# Patient Record
Sex: Female | Born: 1945 | Race: White | Hispanic: No
Health system: Southern US, Academic
[De-identification: ages and names within clinical notes are randomized; demographics above are authoritative.]

## PROBLEM LIST (undated history)

## (undated) ENCOUNTER — Encounter

## (undated) ENCOUNTER — Ambulatory Visit

## (undated) ENCOUNTER — Telehealth: Attending: MOHS-Micrographic Surgery | Primary: MOHS-Micrographic Surgery

## (undated) ENCOUNTER — Encounter: Attending: Dermatology | Primary: Dermatology

## (undated) ENCOUNTER — Encounter: Attending: Internal Medicine | Primary: Internal Medicine

## (undated) ENCOUNTER — Ambulatory Visit
Payer: Medicare (Managed Care) | Attending: Student in an Organized Health Care Education/Training Program | Primary: Student in an Organized Health Care Education/Training Program

## (undated) ENCOUNTER — Encounter
Attending: Student in an Organized Health Care Education/Training Program | Primary: Student in an Organized Health Care Education/Training Program

## (undated) ENCOUNTER — Ambulatory Visit: Payer: MEDICARE | Attending: Cardiovascular Disease | Primary: Cardiovascular Disease

## (undated) ENCOUNTER — Telehealth

## (undated) ENCOUNTER — Ambulatory Visit: Payer: MEDICARE | Attending: Dermatology | Primary: Dermatology

## (undated) ENCOUNTER — Ambulatory Visit: Payer: MEDICARE

## (undated) ENCOUNTER — Ambulatory Visit: Payer: Medicare (Managed Care) | Attending: Medical | Primary: Medical

## (undated) ENCOUNTER — Ambulatory Visit: Attending: Family | Primary: Family

## (undated) ENCOUNTER — Encounter: Attending: MOHS-Micrographic Surgery | Primary: MOHS-Micrographic Surgery

## (undated) ENCOUNTER — Telehealth: Attending: Dermatology | Primary: Dermatology

## (undated) ENCOUNTER — Ambulatory Visit
Payer: MEDICARE | Attending: Student in an Organized Health Care Education/Training Program | Primary: Student in an Organized Health Care Education/Training Program

## (undated) DIAGNOSIS — F419 Anxiety disorder, unspecified: Secondary | ICD-10-CM

## (undated) DIAGNOSIS — I10 Essential (primary) hypertension: Secondary | ICD-10-CM

## (undated) DIAGNOSIS — G4730 Sleep apnea, unspecified: Secondary | ICD-10-CM

## (undated) DIAGNOSIS — L409 Psoriasis, unspecified: Secondary | ICD-10-CM

## (undated) DIAGNOSIS — K219 Gastro-esophageal reflux disease without esophagitis: Secondary | ICD-10-CM

## (undated) DIAGNOSIS — R011 Cardiac murmur, unspecified: Secondary | ICD-10-CM

## (undated) DIAGNOSIS — I4819 Other persistent atrial fibrillation: Secondary | ICD-10-CM

## (undated) DIAGNOSIS — E079 Disorder of thyroid, unspecified: Secondary | ICD-10-CM

## (undated) DIAGNOSIS — R0600 Dyspnea, unspecified: Secondary | ICD-10-CM

## (undated) DIAGNOSIS — I499 Cardiac arrhythmia, unspecified: Secondary | ICD-10-CM

## (undated) DIAGNOSIS — F32A Depression, unspecified: Secondary | ICD-10-CM

## (undated) DIAGNOSIS — J45909 Unspecified asthma, uncomplicated: Secondary | ICD-10-CM

## (undated) DIAGNOSIS — E039 Hypothyroidism, unspecified: Secondary | ICD-10-CM

## (undated) DIAGNOSIS — I2510 Atherosclerotic heart disease of native coronary artery without angina pectoris: Secondary | ICD-10-CM

## (undated) DIAGNOSIS — I509 Heart failure, unspecified: Secondary | ICD-10-CM

## (undated) DIAGNOSIS — I252 Old myocardial infarction: Secondary | ICD-10-CM

## (undated) DIAGNOSIS — F329 Major depressive disorder, single episode, unspecified: Secondary | ICD-10-CM

## (undated) DIAGNOSIS — E785 Hyperlipidemia, unspecified: Secondary | ICD-10-CM

## (undated) DIAGNOSIS — M1990 Unspecified osteoarthritis, unspecified site: Secondary | ICD-10-CM

## (undated) HISTORY — PX: ESOPHAGEAL DILATION: SHX303

## (undated) HISTORY — DX: Disorder of thyroid, unspecified: E07.9

## (undated) HISTORY — DX: Gastro-esophageal reflux disease without esophagitis: K21.9

## (undated) HISTORY — DX: Hyperlipidemia, unspecified: E78.5

## (undated) HISTORY — DX: Other persistent atrial fibrillation: I48.19

## (undated) HISTORY — DX: Sleep apnea, unspecified: G47.30

## (undated) HISTORY — PX: CARDIAC CATHETERIZATION: SHX172

## (undated) HISTORY — DX: Morbid (severe) obesity due to excess calories: E66.01

## (undated) HISTORY — PX: US ECHOCARDIOGRAPHY: HXRAD669

## (undated) MED ORDER — LOSARTAN 50 MG-HYDROCHLOROTHIAZIDE 12.5 MG TABLET: 0.00000 days

---
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
## 2004-10-01 ENCOUNTER — Other Ambulatory Visit: Payer: Self-pay

## 2004-10-01 ENCOUNTER — Emergency Department: Payer: Self-pay | Admitting: Unknown Physician Specialty

## 2004-10-01 ENCOUNTER — Emergency Department: Payer: Self-pay | Admitting: Emergency Medicine

## 2005-06-22 ENCOUNTER — Inpatient Hospital Stay: Payer: Self-pay | Admitting: Internal Medicine

## 2005-06-22 ENCOUNTER — Other Ambulatory Visit: Payer: Self-pay

## 2006-01-03 ENCOUNTER — Ambulatory Visit: Payer: Self-pay | Admitting: Internal Medicine

## 2006-01-19 ENCOUNTER — Ambulatory Visit: Payer: Self-pay | Admitting: Internal Medicine

## 2007-03-06 ENCOUNTER — Ambulatory Visit: Payer: Self-pay | Admitting: Internal Medicine

## 2007-03-07 ENCOUNTER — Ambulatory Visit: Payer: Self-pay | Admitting: Gastroenterology

## 2007-05-06 ENCOUNTER — Emergency Department: Payer: Self-pay | Admitting: Emergency Medicine

## 2007-11-22 ENCOUNTER — Emergency Department: Payer: Self-pay | Admitting: Emergency Medicine

## 2007-11-28 ENCOUNTER — Emergency Department: Payer: Self-pay | Admitting: Emergency Medicine

## 2008-08-22 ENCOUNTER — Emergency Department: Payer: Self-pay | Admitting: Emergency Medicine

## 2010-11-05 ENCOUNTER — Emergency Department: Payer: Self-pay | Admitting: Emergency Medicine

## 2010-11-06 ENCOUNTER — Emergency Department: Payer: Self-pay | Admitting: Internal Medicine

## 2011-02-01 ENCOUNTER — Emergency Department: Payer: Self-pay | Admitting: Emergency Medicine

## 2011-05-20 ENCOUNTER — Emergency Department: Payer: Self-pay | Admitting: Emergency Medicine

## 2011-10-26 ENCOUNTER — Ambulatory Visit: Payer: Self-pay | Admitting: Gastroenterology

## 2011-11-03 ENCOUNTER — Other Ambulatory Visit (HOSPITAL_COMMUNITY): Payer: Self-pay | Admitting: Urology

## 2011-11-03 DIAGNOSIS — D3500 Benign neoplasm of unspecified adrenal gland: Secondary | ICD-10-CM

## 2011-11-11 ENCOUNTER — Other Ambulatory Visit (HOSPITAL_COMMUNITY): Payer: Self-pay | Admitting: Urology

## 2011-11-11 ENCOUNTER — Ambulatory Visit (HOSPITAL_COMMUNITY)
Admission: RE | Admit: 2011-11-11 | Discharge: 2011-11-11 | Disposition: A | Discharge status: Home / Self Care | Payer: Medicare Other | Source: Ambulatory Visit | Attending: Urology | Admitting: Urology

## 2011-11-11 DIAGNOSIS — D3500 Benign neoplasm of unspecified adrenal gland: Secondary | ICD-10-CM

## 2011-11-11 LAB — CREATININE, SERUM: Creatinine, Ser: 0.91 mg/dL (ref 0.50–1.10)

## 2012-02-15 ENCOUNTER — Ambulatory Visit: Payer: Self-pay | Admitting: Internal Medicine

## 2012-02-22 ENCOUNTER — Ambulatory Visit: Payer: Self-pay | Admitting: Internal Medicine

## 2012-05-18 ENCOUNTER — Inpatient Hospital Stay: Payer: Self-pay | Admitting: Internal Medicine

## 2012-05-18 LAB — COMPREHENSIVE METABOLIC PANEL
Alkaline Phosphatase: 110 U/L (ref 50–136)
Creatinine: 0.82 mg/dL (ref 0.60–1.30)
Glucose: 112 mg/dL — ABNORMAL HIGH (ref 65–99)
Potassium: 4.2 mmol/L (ref 3.5–5.1)
SGOT(AST): 29 U/L (ref 15–37)
SGPT (ALT): 33 U/L (ref 12–78)
Total Protein: 7.1 g/dL (ref 6.4–8.2)

## 2012-05-18 LAB — URINALYSIS, COMPLETE
Bacteria: NONE SEEN
Blood: NEGATIVE
Leukocyte Esterase: NEGATIVE
Nitrite: NEGATIVE
Ph: 6 (ref 4.5–8.0)
Protein: NEGATIVE
RBC,UR: 1 /HPF (ref 0–5)
Specific Gravity: 1.011 (ref 1.003–1.030)
Squamous Epithelial: <1

## 2012-05-18 LAB — CBC WITH DIFFERENTIAL/PLATELET
HCT: 44 % (ref 35.0–47.0)
HGB: 14.7 g/dL (ref 12.0–16.0)
Lymphocyte %: 28.1 %
MCH: 28.3 pg (ref 26.0–34.0)
MCHC: 33.5 g/dL (ref 32.0–36.0)
MCV: 85 fL (ref 80–100)
Monocyte %: 8.5 %
Neutrophil %: 58.9 %
RDW: 14.7 % — ABNORMAL HIGH (ref 11.5–14.5)

## 2012-05-18 LAB — CK TOTAL AND CKMB (NOT AT ARMC): CK-MB: 1.3 ng/mL (ref 0.5–3.6)

## 2012-05-18 LAB — APTT: Activated PTT: 27.5 secs (ref 23.6–35.9)

## 2012-05-18 LAB — LIPID PANEL
Ldl Cholesterol, Calc: 127 mg/dL — ABNORMAL HIGH (ref 0–100)
VLDL Cholesterol, Calc: 28 mg/dL (ref 5–40)

## 2012-05-18 LAB — PRO B NATRIURETIC PEPTIDE: B-Type Natriuretic Peptide: 498 pg/mL — ABNORMAL HIGH (ref 0–125)

## 2012-05-18 LAB — TROPONIN I
Troponin-I: 0.06 ng/mL — ABNORMAL HIGH
Troponin-I: 0.07 ng/mL — ABNORMAL HIGH

## 2012-05-19 LAB — BASIC METABOLIC PANEL
Anion Gap: 9 (ref 7–16)
BUN: 10 mg/dL (ref 7–18)
Chloride: 104 mmol/L (ref 98–107)
Co2: 25 mmol/L (ref 21–32)
EGFR (Non-African Amer.): >60
Glucose: 119 mg/dL — ABNORMAL HIGH (ref 65–99)
Osmolality: 276 (ref 275–301)

## 2012-05-19 LAB — CBC WITH DIFFERENTIAL/PLATELET
Basophil #: 0 10*3/uL (ref 0.0–0.1)
Eosinophil #: 0.2 10*3/uL (ref 0.0–0.7)
Eosinophil %: 2.8 %
HCT: 42 % (ref 35.0–47.0)
HGB: 13.6 g/dL (ref 12.0–16.0)
Lymphocyte %: 26.2 %
MCH: 27.9 pg (ref 26.0–34.0)
MCHC: 32.5 g/dL (ref 32.0–36.0)
Monocyte %: 8.7 %
Neutrophil %: 61.7 %
Platelet: 214 10*3/uL (ref 150–440)

## 2012-05-19 LAB — CK TOTAL AND CKMB (NOT AT ARMC): CK-MB: 1.2 ng/mL (ref 0.5–3.6)

## 2012-05-19 LAB — APTT: Activated PTT: 91 secs — ABNORMAL HIGH (ref 23.6–35.9)

## 2012-05-20 LAB — BASIC METABOLIC PANEL
Anion Gap: 6 — ABNORMAL LOW (ref 7–16)
Chloride: 109 mmol/L — ABNORMAL HIGH (ref 98–107)
Creatinine: 0.92 mg/dL (ref 0.60–1.30)
EGFR (Non-African Amer.): >60
Glucose: 110 mg/dL — ABNORMAL HIGH (ref 65–99)
Osmolality: 281 (ref 275–301)
Potassium: 3.6 mmol/L (ref 3.5–5.1)
Sodium: 141 mmol/L (ref 136–145)

## 2012-05-20 LAB — CBC WITH DIFFERENTIAL/PLATELET
Basophil #: 0 10*3/uL (ref 0.0–0.1)
Eosinophil #: 0.2 10*3/uL (ref 0.0–0.7)
Lymphocyte %: 34.5 %
MCH: 28 pg (ref 26.0–34.0)
MCV: 85 fL (ref 80–100)
RBC: 4.66 10*6/uL (ref 3.80–5.20)
RDW: 14.8 % — ABNORMAL HIGH (ref 11.5–14.5)

## 2012-05-20 LAB — APTT: Activated PTT: 85 secs — ABNORMAL HIGH (ref 23.6–35.9)

## 2012-05-21 LAB — PLATELET COUNT: Platelet: 182 10*3/uL (ref 150–440)

## 2012-05-21 LAB — HEMOGLOBIN: HGB: 13.2 g/dL (ref 12.0–16.0)

## 2012-05-28 ENCOUNTER — Emergency Department: Payer: Self-pay | Admitting: Emergency Medicine

## 2012-05-28 LAB — URINALYSIS, COMPLETE
Blood: NEGATIVE
Ketone: NEGATIVE
RBC,UR: <1 /HPF (ref 0–5)
Specific Gravity: 1.006 (ref 1.003–1.030)
Squamous Epithelial: <1

## 2012-05-28 LAB — CBC
HGB: 14 g/dL (ref 12.0–16.0)
MCH: 28.3 pg (ref 26.0–34.0)
MCHC: 33.8 g/dL (ref 32.0–36.0)
MCV: 84 fL (ref 80–100)
Platelet: 270 10*3/uL (ref 150–440)
RBC: 4.95 10*6/uL (ref 3.80–5.20)
RDW: 14.8 % — ABNORMAL HIGH (ref 11.5–14.5)
WBC: 6.8 10*3/uL (ref 3.6–11.0)

## 2012-05-29 LAB — BASIC METABOLIC PANEL
Anion Gap: 8 (ref 7–16)
BUN: 28 mg/dL — ABNORMAL HIGH (ref 7–18)
Calcium, Total: 8.5 mg/dL (ref 8.5–10.1)
Chloride: 103 mmol/L (ref 98–107)
Co2: 26 mmol/L (ref 21–32)
Creatinine: 1.21 mg/dL (ref 0.60–1.30)
EGFR (African American): 54 — ABNORMAL LOW
Osmolality: 280 (ref 275–301)
Potassium: 3.4 mmol/L — ABNORMAL LOW (ref 3.5–5.1)

## 2012-07-13 ENCOUNTER — Observation Stay: Payer: Self-pay | Admitting: Internal Medicine

## 2012-07-13 LAB — COMPREHENSIVE METABOLIC PANEL
Albumin: 3.1 g/dL — ABNORMAL LOW (ref 3.4–5.0)
Anion Gap: 5 — ABNORMAL LOW (ref 7–16)
BUN: 17 mg/dL (ref 7–18)
Bilirubin,Total: 0.7 mg/dL (ref 0.2–1.0)
Chloride: 103 mmol/L (ref 98–107)
Creatinine: 1.09 mg/dL (ref 0.60–1.30)
EGFR (African American): >60
EGFR (Non-African Amer.): 53 — ABNORMAL LOW
Osmolality: 274 (ref 275–301)
Potassium: 3.5 mmol/L (ref 3.5–5.1)
SGOT(AST): 21 U/L (ref 15–37)
SGPT (ALT): 24 U/L (ref 12–78)
Total Protein: 6.3 g/dL — ABNORMAL LOW (ref 6.4–8.2)

## 2012-07-13 LAB — CK TOTAL AND CKMB (NOT AT ARMC): CK, Total: 94 U/L (ref 21–215)

## 2012-07-13 LAB — CBC
HCT: 38.2 % (ref 35.0–47.0)
HGB: 12.8 g/dL (ref 12.0–16.0)
MCH: 28.4 pg (ref 26.0–34.0)
RBC: 4.5 10*6/uL (ref 3.80–5.20)
WBC: 8.5 10*3/uL (ref 3.6–11.0)

## 2012-07-13 LAB — TROPONIN I: Troponin-I: 0.12 ng/mL — ABNORMAL HIGH

## 2012-07-14 LAB — URINALYSIS, COMPLETE
Bacteria: NONE SEEN
Bilirubin,UR: NEGATIVE
Glucose,UR: NEGATIVE mg/dL (ref 0–75)
Ketone: NEGATIVE
Nitrite: NEGATIVE
Protein: NEGATIVE
Specific Gravity: 1.021 (ref 1.003–1.030)
WBC UR: 5 /HPF (ref 0–5)

## 2012-07-14 LAB — CK TOTAL AND CKMB (NOT AT ARMC)
CK, Total: 70 U/L (ref 21–215)
CK, Total: 73 U/L (ref 21–215)
CK-MB: 0.9 ng/mL (ref 0.5–3.6)

## 2012-07-14 LAB — TROPONIN I: Troponin-I: 0.09 ng/mL — ABNORMAL HIGH

## 2012-08-15 ENCOUNTER — Ambulatory Visit: Payer: Self-pay | Admitting: Internal Medicine

## 2012-08-16 ENCOUNTER — Ambulatory Visit: Payer: Self-pay | Admitting: Internal Medicine

## 2012-09-10 ENCOUNTER — Other Ambulatory Visit: Payer: Self-pay | Admitting: Cardiovascular Disease

## 2012-09-20 ENCOUNTER — Ambulatory Visit: Payer: Self-pay | Admitting: Cardiovascular Disease

## 2012-10-05 ENCOUNTER — Ambulatory Visit (INDEPENDENT_AMBULATORY_CARE_PROVIDER_SITE_OTHER): Payer: MEDICARE | Admitting: Surgery

## 2012-10-25 ENCOUNTER — Ambulatory Visit: Payer: Self-pay | Admitting: Internal Medicine

## 2013-08-21 ENCOUNTER — Ambulatory Visit: Payer: Commercial Managed Care - HMO | Admitting: Podiatry

## 2013-09-05 ENCOUNTER — Ambulatory Visit: Payer: Self-pay | Admitting: Gastroenterology

## 2013-10-17 ENCOUNTER — Ambulatory Visit: Payer: Self-pay | Admitting: Internal Medicine

## 2014-04-03 ENCOUNTER — Emergency Department: Payer: Self-pay | Admitting: Emergency Medicine

## 2014-04-03 LAB — BASIC METABOLIC PANEL
ANION GAP: 8 (ref 7–16)
BUN: 15 mg/dL (ref 7–18)
CREATININE: 0.94 mg/dL (ref 0.60–1.30)
Calcium, Total: 8.6 mg/dL (ref 8.5–10.1)
Chloride: 104 mmol/L (ref 98–107)
Co2: 28 mmol/L (ref 21–32)
EGFR (African American): >60
EGFR (Non-African Amer.): >60
Glucose: 118 mg/dL — ABNORMAL HIGH (ref 65–99)
OSMOLALITY: 281 (ref 275–301)
POTASSIUM: 4 mmol/L (ref 3.5–5.1)
SODIUM: 140 mmol/L (ref 136–145)

## 2014-04-03 LAB — CBC
HCT: 46.1 % (ref 35.0–47.0)
HGB: 15 g/dL (ref 12.0–16.0)
MCH: 28.6 pg (ref 26.0–34.0)
MCHC: 32.5 g/dL (ref 32.0–36.0)
MCV: 88 fL (ref 80–100)
Platelet: 256 10*3/uL (ref 150–440)
RBC: 5.23 10*6/uL — ABNORMAL HIGH (ref 3.80–5.20)
RDW: 13.6 % (ref 11.5–14.5)
WBC: 7.8 10*3/uL (ref 3.6–11.0)

## 2014-04-03 LAB — TROPONIN I: Troponin-I: 0.03 ng/mL

## 2014-04-03 LAB — PRO B NATRIURETIC PEPTIDE: B-Type Natriuretic Peptide: 826 pg/mL — ABNORMAL HIGH (ref 0–125)

## 2014-04-11 DIAGNOSIS — I252 Old myocardial infarction: Secondary | ICD-10-CM

## 2014-04-11 HISTORY — DX: Old myocardial infarction: I25.2

## 2014-04-18 ENCOUNTER — Observation Stay: Payer: Self-pay | Admitting: Internal Medicine

## 2014-04-18 LAB — PRO B NATRIURETIC PEPTIDE: B-Type Natriuretic Peptide: 538 pg/mL — ABNORMAL HIGH (ref 0–125)

## 2014-04-18 LAB — BASIC METABOLIC PANEL
Anion Gap: 8 (ref 7–16)
BUN: 16 mg/dL (ref 7–18)
CHLORIDE: 108 mmol/L — AB (ref 98–107)
CO2: 24 mmol/L (ref 21–32)
CREATININE: 0.79 mg/dL (ref 0.60–1.30)
Calcium, Total: 8.4 mg/dL — ABNORMAL LOW (ref 8.5–10.1)
EGFR (African American): >60
EGFR (Non-African Amer.): >60
Glucose: 94 mg/dL (ref 65–99)
Osmolality: 280 (ref 275–301)
POTASSIUM: 3.9 mmol/L (ref 3.5–5.1)
Sodium: 140 mmol/L (ref 136–145)

## 2014-04-18 LAB — TROPONIN I
TROPONIN-I: 0.04 ng/mL
TROPONIN-I: 0.03 ng/mL
TROPONIN-I: 0.04 ng/mL

## 2014-04-18 LAB — CBC
HCT: 44.8 % (ref 35.0–47.0)
HGB: 14.6 g/dL (ref 12.0–16.0)
MCH: 28.7 pg (ref 26.0–34.0)
MCHC: 32.6 g/dL (ref 32.0–36.0)
MCV: 88 fL (ref 80–100)
Platelet: 228 10*3/uL (ref 150–440)
RBC: 5.1 10*6/uL (ref 3.80–5.20)
RDW: 13.5 % (ref 11.5–14.5)
WBC: 6.4 10*3/uL (ref 3.6–11.0)

## 2014-04-18 LAB — PROTIME-INR
INR: 0.9
PROTHROMBIN TIME: 12.5 s (ref 11.5–14.7)

## 2014-04-18 LAB — CK TOTAL AND CKMB (NOT AT ARMC)
CK, TOTAL: 97 U/L (ref 26–192)
CK, Total: 86 U/L (ref 26–192)
CK, Total: 85 U/L (ref 26–192)
CK-MB: 1.8 ng/mL (ref 0.5–3.6)
CK-MB: 1.7 ng/mL (ref 0.5–3.6)
CK-MB: 1.5 ng/mL (ref 0.5–3.6)

## 2014-04-18 LAB — APTT: Activated PTT: 25.9 secs (ref 23.6–35.9)

## 2014-04-19 LAB — MAGNESIUM: Magnesium: 2 mg/dL

## 2014-04-19 LAB — TSH: Thyroid Stimulating Horm: 1.77 u[IU]/mL

## 2014-05-22 ENCOUNTER — Emergency Department: Payer: Self-pay | Admitting: Emergency Medicine

## 2014-05-27 ENCOUNTER — Ambulatory Visit: Payer: Self-pay | Admitting: Urology

## 2014-06-02 ENCOUNTER — Ambulatory Visit: Payer: Self-pay | Admitting: Internal Medicine

## 2014-06-25 ENCOUNTER — Ambulatory Visit: Payer: Self-pay

## 2014-08-01 NOTE — Discharge Summary (Signed)
PATIENT NAME:  Patricia Walker, Patricia Walker MR#:  [id number] DATE OF BIRTH:  10-11-1945  DATE OF ADMISSION:  05/18/2012 DATE OF DISCHARGE:  05/25/2012  PRIMARY CARE PHYSICIAN:  Lamonte Sakai, MD  CARDIOLOGIST:  Neoma Laming, MD  DISCHARGE DIAGNOSES: 1.  Unstable angina.  2.  History of atrial fibrillation.  3.  History of hypertension.  4.  Sinus pauses.   HOSPITAL COURSE: This lady was admitted through the Emergency Room complaining of left substernal chest pain. Please refer to history and physical for full details. First set of cardiac enzymes were elevated.  Cardiology was notified, namely Dr. Neoma Laming, who performed left heart catheterization. Catheterization was uncomplicated and revealed a mid descending coronary artery lesion of 60%, mid circumflex of 50% with mild RCA disease and normal ejection fraction. The patient was placed on intravenous heparin,  nitrates, aspirin and beta blockers. She continued to have chest pain over the weekend while in a monitored bed. Her Imdur was increased to 60 mg, which resulted in resolution of her chest pain, although the patient complained of a headache due to that. She also exhibited 3-second pauses recorded on 04/19/2012 and occurred again on 04/20/2012 her metoprolol and amiodarone were held. The patient was asymptomatic on all occasions of her sinus pauses. The case discussed with Dr. [name], Cardiology, who recommended that since patient is asymptomatic, she could be discharged home to have a Holter monitor arranged as an outpatient and discharge her once the amiodarone and metoprolol are suspended. The patient'Clarabell Matsuoka Imdur was also reduced to 30 mg daily to alleviate her headache.   DISPOSITION: Home.   DISCHARGE CONDITION: Satisfactory.   DISCHARGE MEDICATIONS: The patient will resume her Vicodin 5/325 every 6 hours p.r.n., fexofenadine 180 mg daily, hydrochlorothiazide/losartan 12.5/50, 1 daily, aspirin 325 mg daily, meloxicam 75 mg daily, Prilosec 40 mg daily,  isosorbide mononitrate 30 mg daily, Nitrostat 0.4 mg sublingual p.r.n. q.2 to 5 minutes pain, atorvastatin 20 mg once daily. The patient instructed to discontinue amiodarone.   FOLLOWUP: Follow up with Dr. Neoma Laming in 1 to 2 days.   DIET: Low sodium, low cholesterol.   ACTIVITY: No exertional activity until cardiology follow up.   Discharge process time spent: 32 minutes     ____________________________ [name] Maxon. Elijio Miles, MD sat:ct D: 05/21/2012 13:16:44 ET T: 05/21/2012 13:33:56 ET JOB#: [id number]  cc: Alfredia Ferguson A. Elijio Miles, MD, <Dictator> Perrin Maltese, MD [name], MD Alfredia Ferguson Lisette Abu MD ELECTRONICALLY SIGNED 06/06/2012 13:11

## 2014-08-01 NOTE — Consult Note (Signed)
PATIENT NAME:  Patricia Walker, Patricia Walker MR#:  [id number] DATE OF BIRTH:  May 15, 1945  DATE OF CONSULTATION:  05/18/2012  REFERRING PHYSICIAN:    CONSULTING PHYSICIAN:  [name], MD  INDICATION FOR CONSULTATION: Non-STEMI.   HISTORY OF PRESENT ILLNESS: This is a 69 year old white female with a past medical history of hypertension, GI reflux, with atrial fibrillation who came into the hospital since morning having chest pain. The chest pain is pressure-type associated with shortness of breath and diaphoresis with mildly elevated troponin, thus I was asked to evaluate the patient. The patient has a past medical history of: 1. Atrial fibrillation.  2. Hypertension.  3. Hyperlipidemia.  4. No history of diabetes.   SOCIAL HISTORY: She quit several years ago smoking.   FAMILY HISTORY: Positive for a premature history of coronary artery disease.   ALLERGIES: Not known.   PHYSICAL EXAMINATION:  GENERAL: She is alert, oriented x 3, in mild distress due to chest pain. Her blood pressure is 148/78, respirations 18, pulse right now 96. The monitor shows atrial fibrillation.  NECK: Positive JVD.  LUNGS: Good air entry.  HEART: Irregularly irregular pulse. Normal S1, S2. No audible murmur.  ABDOMEN: Soft, nontender, positive bowel sounds.  EXTREMITIES: No pedal edema.   LABS/STUDIES: EKG shows a-fib 103 beats per minute, nonspecific ST-T changes. The first set troponin is 0.06.   ASSESSMENT AND PLAN: Atrial fibrillation, unstable angina with mildly elevated troponin.   PLAN: To do left heart catheterization.  ____________________________ [name], MD sak:jm D: 05/18/2012 11:25:42 ET T: 05/18/2012 11:42:09 ET JOB#: [id number]  cc: [name], MD, <Dictator> [name] MD ELECTRONICALLY SIGNED 05/28/2012 9:04

## 2014-08-01 NOTE — H&P (Signed)
PATIENT NAME:  Patricia Walker, Patricia Walker MR#:  [id number] DATE OF BIRTH:  1945/06/01  DATE OF ADMISSION:  05/18/2012  PRIMARY CARE PHYSICIAN: Perrin Maltese, MD  CARDIOLOGIST: [name], MD  REQUESTING PHYSICIAN: Algis Liming. Jimmye Norman, MD  CHIEF COMPLAINT: Chest pain.   HISTORY OF PRESENT ILLNESS: The patient is a 69 year old female with a known history of hypertension, hyperlipidemia, atrial fibrillation, is being admitted for unstable angina. The patient started having chest pain this morning around 6:00 a.m., left-sided, not radiating, about 8 out of 10 in severity, was shooting in nature, associated with mild shortness of breath and headache. She was also nauseated. She did not vomit. She never had this bad chest pain ever and decided to come to the Emergency Department. While in the ED, she was found to have borderline elevated troponin with a value of 0.06 and she is being admitted for further evaluation and management, as she has continued to have chest pain.   PAST MEDICAL HISTORY:  1.  Atrial fibrillation, not on Coumadin.  2.  Hypertension.  3.  Hyperlipidemia.  4.  History of CHF.   SOCIAL HISTORY: Quit smoking about a year ago. No alcohol.   FAMILY HISTORY: Positive for father with heart disease, died at the age of 69. Son died of heart disease at the age of 59.   ALLERGIES: No known drug allergies.   MEDICATIONS AT HOME:  1.  Acetaminophen/hydrocodone 325/5, 1 tablet p.o. every 6 hours as needed. 2.  Amiodarone 200 mg p.o. daily.  3.  Aspirin 325 mg p.o. daily.  4.  Fexofenadine 180 mg p.o. daily.  5.  Hydrochlorothiazide/losartan 12.5/50, 1 tablet p.o. daily.  6.  Meloxicam 7.5 mg p.o. daily. 7.  Prilosec 40 mg p.o. b.i.d.   REVIEW OF SYSTEMS:    CONSTITUTIONAL: No fever, fatigue, weakness.  EYES: No blurred or double vision.  ENT: No tinnitus or ear pain.  RESPIRATORY: No cough, wheezing or hemoptysis.  CARDIOVASCULAR: Positive for chest pain, shortness of breath. No  palpitations. History of irregular heart beat.  GASTROINTESTINAL: Positive for nausea. No vomiting or diarrhea.  GENITOURINARY: No dysuria or hematuria.  ENDOCRINE: No polyuria or nocturia. HEMATOLOGIC: No anemia or easy bruising.  SKIN: No rash or lesion.  MUSCULOSKELETAL: No arthritis or muscle cramp.  NEUROLOGIC: No tingling, numbness or weakness.  PSYCHIATRIC: No history of anxiety or depression.   PHYSICAL EXAMINATION:  VITAL SIGNS: Temperature 97.3, heart rate 78 per minute, respirations 12 per minute, blood pressure 127/91 mmHg. She is saturating 97% on room air.  GENERAL: The patient is a 69 year old female lying in the bed comfortably without any acute distress.  EYES: Pupils equal, round, reactive to light and accommodation. No scleral icterus. Extraocular muscles intact.  HEENT: Head atraumatic, normocephalic. Oropharynx and nasopharynx clear. NECK: Supple. No jugular venous distention. No thyroid enlargement or tenderness.  LUNGS: Clear to auscultation bilaterally. No wheezing, rales, rhonchi or crepitation.  CARDIOVASCULAR: Irregularly irregular heart sounds. No murmurs, rubs or gallop.  ABDOMEN: Soft, nontender, nondistended. Bowel sounds present. No organomegaly or mass.  EXTREMITIES: No pedal edema, cyanosis or clubbing.  NEUROLOGIC: Nonfocal examination. Cranial nerves II through XII intact. Muscle strength 5/5. Extremity sensation intact.  PSYCHIATRIC: The patient is oriented to time, place and person x 3.  SKIN: No obvious rash, lesion or ulcer.  LABORATORY, DIAGNOSTIC AND RADIOLOGICAL DATA: Normal BMP. Normal liver function tests. Normal CBC. Troponin of 0.06. PTT of 27.5. Urinalysis was negative.   Chest x-ray while in the  ED showed no acute cardiopulmonary disease. EKG shows atrial fibrillation with rate of 103 beats per minute, nonspecific ST-T changes.   IMPRESSION AND PLAN:  1.  Unstable angina:  Will rule her out with serial troponins. She does have a strong  family history of premature cardiac disease along with long history of smoking, and considering her age with the history of atrial fibrillation, will monitor on telemetry, get cardiology consult, start her on aspirin, full-dose Lovenox and check lipid profile. Will start her on low-dose metoprolol.  2.  Suspected non-ST elevation myocardial infarction with troponin of 0.06 with typical anginal chest pain: Will start her on full-dose heparin, aspirin and beta blocker. Discussed with Dr. Neoma Laming, who is likely taking her to cardiac cath lab today. 3.  History of atrial fibrillation, not on Coumadin: Followed by Dr. Neoma Laming as an outpatient. Rate is well controlled at this time on amiodarone. Will add metoprolol at this time. 4.  Hypertension: Will continue home medication and monitor her blood pressure.   CODE STATUS: Full code.   TIME SPENT: Total time taking care of this patient is 55 minutes.    ____________________________ Lucina Mellow. Manuella Ghazi, MD vss:jm D: 05/18/2012 13:31:48 ET T: 05/18/2012 14:24:33 ET JOB#: [id number]  cc: Sundeep Cary S. Manuella Ghazi, MD, <Dictator> Perrin Maltese, MD [name], MD [place] MD ELECTRONICALLY SIGNED 05/18/2012 17:14

## 2014-08-01 NOTE — Consult Note (Signed)
PATIENT NAME:  Patricia Walker, Patricia Walker MR#:  [id number] DATE OF BIRTH:  November 29, 1945  DATE OF CONSULTATION:  07/13/2012  CONSULTING PHYSICIAN:  [name], MD  PATIENT NAME: This is a 69 year old white female with a past medical history of moderate coronary artery disease. She recently had a cardiac catheterization. She did not have any significant or hemodynamically significant coronary artery disease, with normal ejection fraction. Came into the office yesterday with chest pain. She was found to have intermittent paroxysmal atrial fibrillation with a heart rate of 89. She was started on amiodarone 200 b.i.d. Today she presents again with some dizziness, presyncopal episode with associated chest pain, but she says the chest pain is of a burning type, like hot flash in her chest. The son says he is very uncomfortable with her. She has been having this for a few days and they are concerned.   PAST MEDICAL HISTORY: History of coronary artery disease (nonobstructive), hypertension and hyperlipidemia.   SOCIAL HISTORY: She continues to smoke. No EtOH abuse.   FAMILY HISTORY: Positive for coronary artery disease.   PHYSICAL EXAMINATION:  GENERAL: She is alert, oriented x 3, in no acute distress right now.  VITALS: Her pulse is 58, respirations 18, blood pressure 98/40.  NECK: No JVD.  LUNGS: Clear.  HEART: Regular rate and rhythm. Normal S1, S2. No audible murmur.  ABDOMEN: Soft, nontender, positive bowel sounds.  EXTREMITIES: No pedal edema.   LABORATORY AND DIAGNOSTIC DATA: EKG shows sinus bradycardia, about 58 beats per minute. No acute changes. Mildly elevated troponin, less than 1.0.   ASSESSMENT AND PLAN: Chest pain with moderate coronary artery disease, normal left ventricular ejection fraction, relatively hypotensive, Buster be dehydrated. Advised getting rule out myocardial infarction with serial cardiac enzymes since the first set is slightly elevated. EKG has no acute changes. Advised getting  ultrasound of the gallbladder and will advise admitting the patient for observation.   ____________________________ [name], MD sak:jm D: 07/13/2012 17:07:26 ET T: 07/13/2012 17:35:05 ET JOB#: [id number]  cc: [name], MD, <Dictator> [name] MD ELECTRONICALLY SIGNED 07/23/2012 8:54

## 2014-08-01 NOTE — H&P (Signed)
PATIENT NAME:  Patricia Walker, Patricia Walker MR#:  [id number] DATE OF BIRTH:  Dec 11, 1945  DATE OF ADMISSION:  07/13/2012  CARDIOLOGIST: Dr. Neoma Laming.   CHIEF COMPLAINT: "Feeling hot in the midepigastric area."   HISTORY OF THE PRESENT ILLNESS: The patient is a 69 year old morbidly obese Caucasian female with history of coronary artery disease, underwent recent cardiac catheterization in February 2014 which showed CAD; however, noncritical for any further intervention being medically treated. History of hypertension, hyperlipidemia and recent onset of AFib on amiodarone. Comes to the Emergency Room with " feeling hot and burning around the epigastric and midsternal area". She denies any bloating, vomiting, or any symptoms related to GERD. She was found to have troponin of 0.12. EKG did not show any acute changes. She is being admitted for further evaluation and management.  In the Emergency Room the patient received aspirin, a dose of Lovenox, and was seen by Dr. Neoma Laming.   PAST MEDICAL HISTORY:  1.  History of congestive heart failure.  2.  Atrial fibrillation. Recently started on amiodarone 200 mg b.i.d.  3.  Psoriasis.  4.  Hypertension.  5.  History of nephrolithiasis.  6.  History of gastroesophageal reflux disease, status post.  7.  History of esophageal dilation.   ALLERGIES: No known drug allergies.   MEDICATIONS:  1.  Maxzide 1000 mg b.i.d.  2.  ProAir HFA 2 puffs every 4 to 6 hourly as needed.  3.  Nitrostat 0.4 mg sublingual As needed.  4.  Nexium 40 mg b.i.d.  5.  Nasonex 50 mg two sprays as needed.  6.  Meloxicam 7.5 mg daily.  7.  Imdur 60 mg extended release daily.  8.  Hydrochlorothiazide/losartan 12.5/50 one daily.  9.  Fexofenadine 180 mg daily.  10.  Atorvastatin 20 mg daily.  11.  Aspirin 325 mg daily.  12.  Amiodarone 200 mg daily.   SOCIAL HISTORY:  Lives at home, nonsmoker, nonalcoholic. Quit smoking about a year ago.   FAMILY HISTORY:  Positive for heart disease  in father.  Son died of heart disease at age 41.   PAST SURGICAL HISTORY:  None.   REVIEW OF SYSTEMS: CONSTITUTIONAL: No fever, fatigue, weakness.  HEENT:  Eyes:  No blurred or double vision or cataracts.  ENT: No tinnitus, ear pain, hearing loss.  RESPIRATORY: No cough, wheeze, hemoptysis, or COPD.  CARDIOVASCULAR: Positive for chest pain and hypertension. Positive for palpitations.  GASTROINTESTINAL: No nausea, vomiting, diarrhea or abdominal pain.  GENITOURINARY: No dysuria, hematuria, or frequency.  ENDOCRINE: No polyuria, nocturia, or thyroid problems.  HEMATOLOGY: No anemia or easy bruising.  SKIN: Positive for psoriatic rash.  MUSCULOSKELETAL: Positive for back pain and arthritis.  NEUROLOGIC: No CVA or TIA.  PSYCHIATRIC: No anxiety or depression.   All other systems reviewed and are negative.   PHYSICAL EXAMINATION:  GENERAL: The patient is awake, alert, oriented x3, not in acute distress.  VITAL SIGNS: Afebrile, pulse is 62, blood pressure is 100/63, sats are 98% on room air.  GENERAL: The patient is morbidly obese, not in distress.  HEENT: Atraumatic, normocephalic. PERRLA. EOM intact. Oral mucosa is moist.  NECK: Supple. No JVD. No carotid bruit.  RESPIRATORY: Clear to auscultation bilaterally. No rales, rhonchi, respiratory distress or labored breathing.  CARDIOVASCULAR:   Both the heart sounds are normal. Rate, rhythm regular. PMI not lateralized.  CHEST:  Nontender.  EXTREMITIES: Good pedal pulses, good femoral pulses. No lower extremity edema.  SKIN: The patient has psoriatic rash in the  lower extremities.  ABDOMEN: Obese, soft, nontender. No organomegaly.  NEUROLOGIC: Grossly intact cranial nerves II through XII. No motor or sensory deficits.  PSYCHIATRIC: The patient is awake, alert, and oriented area.   DIAGNOSTIC DATA:  EKG shows sinus bradycardia with left ventricular hypertrophy, nonspecific T wave changes.   PT-INR within normal limits. Troponin 0.12.  CK  total and MB fraction are within normal limits. CBC within normal limits. Comprehensive metabolic panel within normal limits.   Chest x-ray shows no acute cardiopulmonary disease.   ASSESSMENT: A 69 year old  patient with history of coronary artery disease.  The patient presents with feeling hot and burning in the epigastric area for 2 to 3 days. We will admit patient with:  1.  Unstable angina with history of coronary artery disease, recent catheterization in February 2014, rule out gallbladder disease although LFT is normal. The patient reports complaints of some right upper quadrant pain.  2.  Gastroesophageal reflux disease.  The patient to continue PPI.  3.  Atrial fibrillation. Started on amiodarone since April 3rd by Dr. [name], heart rate in the 50s, decrease dose to 200 mg p.o. daily.  4.  Relative hypotension. Hold BP meds today.  5.  Morbid obesity.  6.  Hyperlipidemia. We will continue statins.   Further work-up per the patient's clinical course. Hospital admission plan was discussed with the patient. The patient was seen by Dr. [name] in the Emergency Room.   TIME SPENT:  50 minutes.   ____________________________ Hart Rochester Posey Pronto, MD sap:dd D: 07/13/2012 18:43:19 ET T: 07/13/2012 19:47:45 ET JOB#: [id number]  cc: Tzivia Oneil A. Posey Pronto, MD, <Dictator> [name], MD Ilda Basset MD ELECTRONICALLY SIGNED 07/18/2012 14:17

## 2014-08-10 NOTE — H&P (Signed)
PATIENT NAME:  Patricia Walker, Patricia Walker MR#:  [id number] DATE OF BIRTH:  April 22, 1945  DATE OF ADMISSION:  04/18/2014  PRIMARY CARE PHYSICIAN:  Dr. Perrin Maltese.   EMERGENCY ROOM PHYSICIAN: Dr. Jimmye Norman.   CHIEF COMPLAINT: Chest pain.   HISTORY OF PRESENT ILLNESS: The patient is 69 year old female patient with history of high blood pressure, sleep apnea, psoriatic psoriasis, comes in because of chest pain. The patient started to have chest pain this morning. In the morning, around 15 minutes, started around 10:00 a.m. and started in the middle of the chest, radiated across. The patient took 2 nitroglycerin that helped her with chest pain. The patient did not have any radiation of the pain to the back or the left arm. Did not have dizziness, but felt nauseous. Did not have sweating. The patient felt short of breath.    PAST MEDICAL HISTORY: Significant for high blood pressure, history of sleep apnea, psoriasis, hyperlipidemia, previous history of heart attack last year. She follows up with Dr. Neoma Laming. Also has a history of proximal atrial fibrillation.   ALLERGIES: No known allergies.   PAST SURGICAL HISTORY: None.   FAMILY HISTORY: Brother has diabetes.   SOCIAL HISTORY: Quit smoking 3 years ago. The patient was a heavy smoker, smoked about 3 packs for 40 years. Occasional alcohol. No drugs.   MEDICATIONS: Amiodarone 200 mg b.i.d., aspirin 325 mg p.o. daily, atorvastatin 20 mg daily, folic acid 1 mg daily, furosemide 20 mg p.o. daily, hydrochlorothiazide/losartan 12.5/50 mg p.o. daily, Imdur 60 mg p.o. daily, Meloxicam 15 mg p.o. daily, Nasonex 2 sprays in each nostril, Nexium 40 mg p.o. daily, Nitrostat sublingual 0.4 mg every 15 minutes as needed. ProAir 1 puff b.i.d., nitroglycerin sublingual as needed for chest pain p.o. 0.4 mg, Ranexa 100 mg p.o. b.i.d.   REVIEW OF SYSTEMS:  CONSTITUTIONAL: No fever. No fatigue.  EYES: No blurred vision.  EARS, NOSE, AND THROAT: No tinnitus. No ear pain. No  epistaxis. No difficulty swallowing.  RESPIRATORY: The patient has no cough, no wheezing, no hemoptysis.  CARDIOVASCULAR: No chest pain. No orthopnea. No pedal edema.  GASTROINTESTINAL: No nausea. No vomiting. No abdominal pain.  GENITOURINARY: No dysuria or hematuria.  ENDOCRINE: No polyuria or nocturia.  HEMATOLOGIC: No anemia or easy bruising. INTEGUMENTARY: No skin rashes.  MUSCULOSKELETAL: No joint pain.  NEUROLOGIC: No numbness or weakness.  PSYCHIATRIC: No anxiety or insomnia.   PHYSICAL EXAMINATION: VITAL SIGNS: Temperature 98.8 heart rate 70, blood pressure 132/120 initially, but repeat 127/84, saturations 97% on room air.  GENERAL: The patient is alert, awake, oriented, obese female with a BMI of 51, not in distress.  HEAD: Atraumatic, normocephalic.  EYES: Pupils equal, reacting to light. Extraocular movements are intact.  EARS, NOSE, AND THROAT: Tympanic membrane no congestion. External auditory canals are normal. Hearing is intact. Nose: No turbinate hypertrophy. Pharynx: No pharyngeal erythema. Mucous membranes are normal.  NECK: Thyroid enlargement is not seen. No JVD. No carotid bruit. No lymphadenopathy.   RESPIRATORY: Bilaterally clear to auscultation. No wheeze. No rales. Not using accessory muscles of respiration.  CARDIOVASCULAR: No chest wall tenderness, rate regular. The patient has no murmurs. Good pedal pulses and femoral pulses. No extremity edema.  ABDOMEN: Soft, obese. Bowel sounds present. No organomegaly.  MUSCULOSKELETAL: Strength 5/5 in upper and lower extremities.  SKIN: No skin rashes. Warm and dry.  LYMPHATICS: No lymphadenopathy in cervical or axillary region.  NEUROLOGIC: Cranial nerves II through XII are intact. DTR 2+ bilaterally; no dysarthria or aphagia. No  dysphagia. Power is 5/5 in upper and lower extremities.  PSYCHIATRIC: Oriented to time, place, and person. Cooperative. Judgment is good.   LABORATORY DATA: Electrolytes: Sodium 140, potassium  3.9, chloride 108, bicarbonate 24, BUN 16, creatinine 0.7 and glucose 94. Troponin 0.03  CK total is 97. BNP 538. Chest x-ray shows borderline cardiomegaly. Her troponins as I mentioned. WBC 6.4, hemoglobin 14.6, hematocrit 44.8, platelets 228.   EKG: Atrial fibrillation with 76 beats per minutes.   ASSESSMENT AND PLAN:  1. The patient is a 69 year old morbidly obese female with risk factors of high blood pressure, previous heart attack with medical management advised in last year, comes in with chest pain. She took nitroglycerin. Symptoms are concerning for unstable angina. Admit her to telemetry. Started on aspirin, beta blockers, statins, and nitrates. The patient's troponins will be trended again 2 more times along, with CK. If the troponin is elevated, we will start her  on dull-dose anticoagulation, and obtain cardiology consult. Otherwise, she will get a stress test tomorrow morning.  2. Sleep apnea. The patient is getting CPAP at home, and she is in the process of getting CPAP at home.  3. History of atrial fibrillation, paroxysmal. The patient is on full-dose aspirin. Continue that. The patient's CHADS VASC score is a 3. She is at high risk for stroke in the future, and the patient needs full-dose anticoagulation. If it persists, we will get echocardiogram and cycle the troponins. The patient's previous records were reviewed, and the patient had a cardiac cath in 2014. At that time, it showed no significant stenosis, and the patient had a normal EF.  4. The patient's other diagnosis includes a history of psoriasis. She is on Enbrel.  5. Nonobstructive coronary artery disease. She is on Ranexa at home . continuing the nitrates and Ranexa and statins. Obtain cardiology consult.   TIME SPENT: 55 minutes    ____________________________ Epifanio Lesches, MD sk:mw D: 04/18/2014 16:17:36 ET T: 04/18/2014 16:49:58 ET JOB#: [id number]  cc: Epifanio Lesches, MD, <Dictator> Epifanio Lesches  MD ELECTRONICALLY SIGNED 05/14/2014 17:44

## 2014-08-10 NOTE — Discharge Summary (Signed)
PATIENT NAME:  Patricia, Walker MR#:  [id number] DATE OF BIRTH:  November 17, 1945  DATE OF ADMISSION:  04/18/2014 DATE OF DISCHARGE:  04/20/2014  ADMITTING DIAGNOSIS: Chest pain.  DISCHARGE DIAGNOSES:  1.  Paroxysmal atrial fibrillation with slow ventricular response due to beta blockers as well as possibly amiodarone with pauses, resolving off these medications.  2.  Chest pain of unclear etiology with normal Myoview and negative cardiac enzymes.  3.  Lower extremity swelling.  4.  No deep venous thrombosis on Doppler ultrasound. 5.  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 6.  History of essential hypertension, obstructive sleep apnea, hyperlipidemia, coronary artery disease, status post myocardial infarction in the past and history of obesity.   DISCHARGE CONDITION: Stable.   DISCHARGE MEDICATIONS:  1.  The patient is to continue hydrochlorothiazide/losartan 12.5 mg/50 mg once daily. 2.  Nasonex 2 sprays once daily as needed.  3.  Atorvastatin 10 mg p.o. daily.  4.  Isosorbide mononitrate 60 mg p.o. daily. 5.  Nexium 40 mg p.o. twice daily. 6.  Nitrostat 0.4 mg sublingually every 5 minutes as needed x 3.  7.  ProAir HFA 2 puffs every 4 to 6 hours as needed.  8.  Ranexa 1000 mg twice daily.  9.  Meloxicam 15 mg p.o. daily.  10.  Folic acid 1 mg p.o. daily.  11.  Furosemide 20 mg p.o. daily.  12.  Proventil HFA 2 puffs every 6 hours as needed.  13.  Aspirin 81 mg p.o. daily.  14.  Lyrica 50 mg p.o. twice daily.  15.  Eliquis 5 mg p.o. twice daily.   The patient was advised to stop amiodarone as recommended by primary cardiology.   HOME OXYGEN: None.   DIET: Low-salt, low-fat, low-cholesterol, regular consistency.   ACTIVITY LIMITATIONS: As tolerated.    FOLLOWUP APPOINTMENTS: With Lamonte Sakai in 2 days after discharge, Dr. Neoma Laming in 2 days after discharge. Also, followup appointment with [facility] neurology in 1-2 weeks after discharge to evaluate for lower extremity pain and  questionable neuropathy.   CONSULTANTS: Care management, social work, Dr. Saralyn Pilar.   RADIOLOGIC STUDIES: Chest x-ray, portable single view, 04/18/2014 revealed borderline cardiomegaly, otherwise negative examination. Chest is stable from prior examination. Nuclear medicine testing, Myoview stress test 04/20/2014, two-day stress test revealed no significant wall motion abnormalities, pharmacological myocardial perfusion study with no significant ischemia, estimated ejection fraction was 50%. There were no EKG changes concerning for ischemia. There was no artifact noted on this study done by Dr. Saralyn Pilar. 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   HOSPITAL COURSE: The patient is a 69 year old female with past medical history significant for history of obstructive sleep apnea, hypertension, hyperlipidemia, coronary artery disease as well as paroxysmal atrial fibrillation, who presents to the hospital with complaints of chest pain. Please refer to Dr. Governor Specking admission note on 04/18/2014. On arrival to the hospital, the patient was noted to be in atrial fibrillation at a rate of 73 beats per minute with no acute ST-T changes. The patient's laboratory data done on arrival to the hospital showed elevated beta-type natriuretic peptide of 538. Otherwise BMP was unremarkable. The patient's calcium level was low at 8.4, magnesium level was 2.0. Cardiac enzymes x 3 were within normal limits. TSH was normal at 1.77. White blood cell count was 6.4, hemoglobin was 14.6,  platelet count was 228,000. Coagulation panel was unremarkable. The patient was admitted to the hospital for further evaluation. Her cardiac enzymes were cycled and she was deemed to be appropriate to get a cardiac stress  test. Cardiac stress test was performed as 2-day stress test and results were available on 04/20/2014 and stress test was unremarkable. It was unclear why the patient had chest pain and further investigation would need to be entertained as an outpatient. The patient did have Doppler ultrasound of her lower extremities done, which was negative for acute DVT. 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 The patient was noted to be in atrial fibrillation and since she received metoprolol while in the hospital for her chest pains she was noted to have pauses of approximately 2.4 seconds. The patient's metoprolol was at that point discontinued; however, still the patient was continuing to have some pauses as high as 2.0 seconds on 04/20/2014. It was felt that the patient should have her amiodarone discontinued completely and that was discussed with Dr. Saralyn Pilar who was in agreement. The patient is being discharged to home. She is to follow up with her primary cardiologist, Dr. Neoma Laming, for further management of her medications. At this point, since the patient remains in atrial fibrillation, we felt that the patient would benefit from Eliquis initiation. The patient was started on Eliquis at 5 mg twice daily dose. Aspirin therapy should be continued because of her history of coronary artery disease, however, dose was decreased to 81 mg once daily dose. In regard to hypertension, the patient is to continue her usual doses of hydrochlorothiazide/losartan, no changes were made. For history of obstructive sleep apnea, the patient is to continue followup. The patient Treat benefit from evaluation as outpatient sleep study because I am concerned that the patient's bradycardia and pauses Sprecher be related to her obstructive sleep apnea. For history of hyperlipidemia, the patient is to continue atorvastatin. The patient is being discharged in stable condition with the above-mentioned  medications and followup. On the day of discharge, temperature was 98, pulse was ranging from 60s to 70s, respiration rate was 18 to 20, blood pressure 122/83, saturation was 98% on room air at rest.   TIME SPENT: Forty minutes.    ____________________________ Theodoro Grist, MD rv:TT D: 04/20/2014 16:41:26 ET T: 04/20/2014 20:26:04 ET JOB#: [id number]  cc: Theodoro Grist, MD, <Dictator> Perrin Maltese, MD [name], MD [facility]  [place] MD ELECTRONICALLY SIGNED 04/24/2014 17:19

## 2014-08-10 NOTE — H&P (Signed)
PATIENT NAME:  Patricia Walker, Patricia Walker MR#:  [id number] DATE OF BIRTH:  04/16/1945  DATE OF ADMISSION:  04/18/2014  PRIMARY CARE PHYSICIAN: Perrin Maltese, MD  EMERGENCY ROOM PHYSICIAN: Algis Liming. Jimmye Norman, MD  CHIEF COMPLAINT: Chest pain.   HISTORY OF PRESENT ILLNESS: The patient is 69 year old female patient with a history of high blood pressure, sleep apnea, and psoriasis comes in because of chest pain. The patient started to have chest pain this morning for about 15 minutes. It started around 10:00 a.m., started in the middle of the chest, radiated across. The patient took 2 nitroglycerin and that helped her with chest pain. The patient did not have any radiation of the pain to the back or the left arm. Did not have dizziness, but felt nauseous. Did not have sweating. The patient felt short of breath.    PAST MEDICAL HISTORY: Significant for high blood pressure, history of sleep apnea, psoriasis, hyperlipidemia, previous history of heart attack  last year; she follows up with Dr. Anson Fret. She also has a history of proximal atrial fibrillation.   ALLERGIES: No known allergies.   SURGICAL HISTORY: None.   FAMILY HISTORY: Brother has diabetes.   SOCIAL HISTORY: Quit smoking 3 years ago. The patient was a heavy smoker; smoked about 3 packs a day for 40 years. Occasional alcohol. No drugs.   MEDICATIONS: Amiodarone 200 mg b.i.d., aspirin 325 mg p.o. daily, atorvastatin 20 mg daily, folic acid 1 mg daily, furosemide 20 mg p.o. daily, hydrochlorothiazide/losartan 12.5/50 mg p.o. daily, Imdur 60 mg p.o. daily, meloxicam 15 mg p.o. daily, Nasonex 2 sprays in each nostril, Nexium 40 mg p.o. daily, Nitrostat sublingual 0.4 mg every 15 minutes as needed, ProAir 1 puff b.i.d., nitroglycerin sublingual and as needed for chest pain 0.4 mg, Ranexa 100 mg p.o. b.i.d.   REVIEW OF SYSTEMS:  CONSTITUTIONAL: No fever. No fatigue.  EYES: No blurred vision.  EARS, NOSE, AND THROAT No tinnitus. No ear pain. No  epistaxis. No difficulty swallowing. RESPIRATION: Patient has no cough, no wheezing, no hemoptysis.  CARDIOVASCULAR: No chest pain. No orthopnea. No pedal edema.  GASTROINTESTINAL: No nausea. No vomiting. No abdominal pain.  GENITOURINARY: No dysuria or hematuria.  ENDOCRINE: No polyuria or nocturia.  HEMATOLOGIC: No anemia or easy bruising. INTEGUMENTARY: No skin rashes.  MUSCULOSKELETAL: No joint pain.  NEUROLOGIC: No numbness or weakness.  PSYCHIATRIC: No anxiety or insomnia.   PHYSICAL EXAMINATION:  VITAL SIGNS: Temperature 98 Fahrenheit, heart rate 70, blood pressure 132/120 initially, but repeat 127/84, saturations 97% on room air.  GENERAL: Alert, awake, oriented. Obese female with a BMI of 51. Not in distress.  HEAD: Atraumatic, normocephalic.  EYES: Pupils equal, reacting to light. Extraocular movements are intact.  EARS, NOSE, AND THROAT: No tympanic membrane congestion. External auditory canals are normal. Hearing is intact.  NOSE: No turbinate hypertrophy.  PHARYNX: No pharyngeal erythema. Mucous membranes are normal.  NECK: Thyroid enlargement is not seen. No JVD. No carotid bruit. No lymphadenopathy.  RESPIRATORY: Bilaterally clear to auscultation. No wheeze. No rales. Not using accessory muscles of respiration.  CARDIOVASCULAR: No chest wall tenderness. Rate regular. The patient has no murmurs. Good pedal pulses and femoral pulses. No extremity edema.  ABDOMEN: Soft, obese. Bowel sounds present. No organomegaly.  MUSCULOSKELETAL: Strength 5/5 in upper and lower extremities.  SKIN: No skin rashes. Warm and dry.  LYMPHATICS: No lymphadenopathy in cervical or axillary regions.  NEUROLOGIC: Cranial nerves II through XII intact. DTRs 2+ bilaterally side. No dysarthria or aphagia. No  dysphagia. Power 5/5 in upper and lower extremities.  PSYCHIATRIC: Oriented to time, place, person. Cooperative. Judgment is good.   LABORATORY DATA: Electrolytes: Sodium 140, potassium 3.9,  chloride 108, bicarbonate 24, BUN 16, creatinine 0.7 and glucose 94. Troponin 0.03. CK total is 97. BNP 538.   Chest x-ray shows a borderline cardiomegaly.   WBC 6.4, hemoglobin 14.6, hematocrit 44.8, platelets 228,000.   EKG: Atrial fibrillation with 76 beats per minutes.   ASSESSMENT AND PLAN:  1.  The patient is a 69 year old morbidly obese female with risk factors of high blood pressure, previous heart attack with medical management advised last year, comes in with chest pain relieved with nitroglycerin. Symptoms are concerning for unstable angina. Admit her to telemetry. Start her on aspirin, beta blockers, statins and nitrates. The patient's troponins will be trended again, 2 more times, along with CK. If the troponin is elevated, we will start her on further anticoagulation, and obtain cardiology consult. Otherwise, she will get a stress test tomorrow morning.  2.  Sleep apnea. The patient is in the process of getting CPAP at home.  3.  History of atrial fibrillation, paroxysmal The patient is on full-dose aspirin; continue that. The patient's CHADS VASC score is 3. She is at high risk for stroke in the future, and the patient needs full dose anticoagulation if the atrial fibrillation persists. .Will get echocardiogram and cycle the troponins. Previous records are reviewed. The patient had a cardiac catheterization in 2014; at that time it showed no significant stenosis and the patient had normal ejection fraction. 3.  History of psoriasis. She is on Enbrel.  4.  Nonobstructive coronary artery disease. She is on Ranexa at home and Imdur. We are continuing the nitrites and the Ranexa and statins.  Obtain cardiology consult.   TIME SPENT: 55 minutes.    ____________________________ Epifanio Lesches, MD sk:MT D: 04/18/2014 16:17:36 ET T: 04/18/2014 18:51:36 ET JOB#: [id number]  cc: Epifanio Lesches, MD, <Dictator> Epifanio Lesches MD ELECTRONICALLY SIGNED 05/14/2014 17:45

## 2014-12-26 ENCOUNTER — Other Ambulatory Visit: Payer: Self-pay | Admitting: Internal Medicine

## 2014-12-26 DIAGNOSIS — Z1231 Encounter for screening mammogram for malignant neoplasm of breast: Secondary | ICD-10-CM

## 2015-01-06 ENCOUNTER — Ambulatory Visit: Payer: Medicaid Other | Attending: Internal Medicine

## 2015-02-22 ENCOUNTER — Emergency Department
Admission: EM | Admit: 2015-02-22 | Discharge: 2015-02-22 | Disposition: A | Discharge status: Home / Self Care | Payer: No Typology Code available for payment source | Attending: Emergency Medicine | Admitting: Emergency Medicine

## 2015-02-22 ENCOUNTER — Encounter: Payer: Self-pay | Admitting: Emergency Medicine

## 2015-02-22 ENCOUNTER — Emergency Department: Payer: No Typology Code available for payment source

## 2015-02-22 DIAGNOSIS — S199XXA Unspecified injury of neck, initial encounter: Secondary | ICD-10-CM | POA: Diagnosis present

## 2015-02-22 DIAGNOSIS — S134XXA Sprain of ligaments of cervical spine, initial encounter: Secondary | ICD-10-CM | POA: Insufficient documentation

## 2015-02-22 DIAGNOSIS — Y998 Other external cause status: Secondary | ICD-10-CM | POA: Diagnosis not present

## 2015-02-22 DIAGNOSIS — I10 Essential (primary) hypertension: Secondary | ICD-10-CM | POA: Diagnosis not present

## 2015-02-22 DIAGNOSIS — Z87891 Personal history of nicotine dependence: Secondary | ICD-10-CM | POA: Diagnosis not present

## 2015-02-22 DIAGNOSIS — M7918 Myalgia, other site: Secondary | ICD-10-CM

## 2015-02-22 DIAGNOSIS — S0990XA Unspecified injury of head, initial encounter: Secondary | ICD-10-CM | POA: Insufficient documentation

## 2015-02-22 DIAGNOSIS — Y9389 Activity, other specified: Secondary | ICD-10-CM | POA: Diagnosis not present

## 2015-02-22 DIAGNOSIS — S139XXA Sprain of joints and ligaments of unspecified parts of neck, initial encounter: Secondary | ICD-10-CM

## 2015-02-22 DIAGNOSIS — Y92410 Unspecified street and highway as the place of occurrence of the external cause: Secondary | ICD-10-CM | POA: Insufficient documentation

## 2015-02-22 HISTORY — DX: Atherosclerotic heart disease of native coronary artery without angina pectoris: I25.10

## 2015-02-22 HISTORY — DX: Old myocardial infarction: I25.2

## 2015-02-22 HISTORY — DX: Essential (primary) hypertension: I10

## 2015-02-22 MED ORDER — TRAMADOL HCL 50 MG PO TABS: 50.0000 mg | ORAL_TABLET | Freq: Four times a day (QID) | ORAL | Status: DC | PRN

## 2015-02-22 MED ORDER — ACETAMINOPHEN 500 MG PO TABS
1000.0000 mg | ORAL_TABLET | Freq: Once | ORAL | Status: AC
  Administered 2015-02-22: 1000 mg via ORAL

## 2015-02-22 MED ORDER — CYCLOBENZAPRINE HCL 10 MG PO TABS: 10.0000 mg | ORAL_TABLET | Freq: Three times a day (TID) | ORAL | Status: DC | PRN

## 2015-02-22 MED ORDER — ACETAMINOPHEN 500 MG PO TABS
ORAL_TABLET | ORAL | Status: AC
  Filled 2015-02-22: qty 2
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
## 2015-02-22 NOTE — Discharge Instructions (Signed)
Cervical Sprain  A cervical sprain is an injury in the neck in which the strong, fibrous tissues (ligaments) that connect your neck bones stretch or tear. Cervical sprains can range from mild to severe. Severe cervical sprains can cause the neck vertebrae to be unstable. This can lead to damage of the spinal cord and can result in serious nervous system problems. The amount of time it takes for a cervical sprain to get better depends on the cause and extent of the injury. Most cervical sprains heal in 1 to 3 weeks.  CAUSES   Severe cervical sprains Un be caused by:    Contact sport injuries (such as from football, rugby, wrestling, hockey, auto racing, gymnastics, diving, martial arts, or boxing).    Motor vehicle collisions.    Whiplash injuries. This is an injury from a sudden forward and backward whipping movement of the head and neck.   Falls.   Mild cervical sprains Essman be caused by:    Being in an awkward position, such as while cradling a telephone between your ear and shoulder.    Sitting in a chair that does not offer proper support.    Working at a poorly designed computer station.    Looking up or down for long periods of time.   SYMPTOMS    Pain, soreness, stiffness, or a burning sensation in the front, back, or sides of the neck. This discomfort [place]iff develop immediately after the injury or slowly, 24 hours or more after the injury.    Pain or tenderness directly in the middle of the back of the neck.    Shoulder or upper back pain.    Limited ability to move the neck.    Headache.    Dizziness.    Weakness, numbness, or tingling in the hands or arms.    Muscle spasms.    Difficulty swallowing or chewing.    Tenderness and swelling of the neck.   DIAGNOSIS   Most of the time your health care provider can diagnose a cervical sprain by taking your history and doing a physical exam. Your health care provider will ask about previous neck injuries and any known neck  problems, such as arthritis in the neck. X-rays Mordan be taken to find out if there are any other problems, such as with the bones of the neck. Other tests, such as a CT scan or MRI, Alden also be needed.   TREATMENT   Treatment depends on the severity of the cervical sprain. Mild sprains can be treated with rest, keeping the neck in place (immobilization), and pain medicines. Severe cervical sprains are immediately immobilized. Further treatment is done to help with pain, muscle spasms, and other symptoms and [place] include:   Medicines, such as pain relievers, numbing medicines, or muscle relaxants.    Physical therapy. This [place] involve stretching exercises, strengthening exercises, and posture training. Exercises and improved posture can help stabilize the neck, strengthen muscles, and help stop symptoms from returning.   HOME CARE INSTRUCTIONS    Put ice on the injured area.     Put ice in a plastic bag.     Place a towel between your skin and the bag.     Leave the ice on for 15-20 minutes, 3-4 times a day.    If your injury was severe, you Mcloud have been given a cervical collar to wear. A cervical collar is a two-piece collar designed to keep your neck from moving while it heals.      Do not remove the collar unless instructed by your health care provider.    If you have long hair, keep it outside of the collar.    Ask your health care provider before making any adjustments to your collar. Minor adjustments Hellstrom be required over time to improve comfort and reduce pressure on your chin or on the back of your head.    Ifyou are allowed to remove the collar for cleaning or bathing, follow your health care provider's instructions on how to do so safely.    Keep your collar clean by wiping it with mild soap and water and drying it completely. If the collar you have been given includes removable pads, remove them every 1-2 days and hand wash them with soap and water. Allow them to air dry. They should be completely  dry before you wear them in the collar.    If you are allowed to remove the collar for cleaning and bathing, wash and dry the skin of your neck. Check your skin for irritation or sores. If you see any, tell your health care provider.    Do not drive while wearing the collar.    Only take over-the-counter or prescription medicines for pain, discomfort, or fever as directed by your health care provider.    Keep all follow-up appointments as directed by your health care provider.    Keep all physical therapy appointments as directed by your health care provider.    Make any needed adjustments to your workstation to promote good posture.    Avoid positions and activities that make your symptoms worse.    Warm up and stretch before being active to help prevent problems.   SEEK MEDICAL CARE IF:    Your pain is not controlled with medicine.    You are unable to decrease your pain medicine over time as planned.    Your activity level is not improving as expected.   SEEK IMMEDIATE MEDICAL CARE IF:    You develop any bleeding.   You develop stomach upset.   You have signs of an allergic reaction to your medicine.    Your symptoms get worse.    You develop new, unexplained symptoms.    You have numbness, tingling, weakness, or paralysis in any part of your body.   MAKE SURE YOU:    Understand these instructions.   Will watch your condition.   Will get help right away if you are not doing well or get worse.     This information is not intended to replace advice given to you by your health care provider. Make sure you discuss any questions you have with your health care provider.     Document Released: 01/23/2007 Document Revised: 04/02/2013 Document Reviewed: 10/03/2012  Elsevier Interactive Patient Education 2016 [employer]
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
## 2015-03-27 ENCOUNTER — Ambulatory Visit
Admission: RE | Admit: 2015-03-27 | Discharge: 2015-03-27 | Disposition: A | Discharge status: Home / Self Care | Payer: Medicare HMO | Source: Ambulatory Visit | Attending: Internal Medicine | Admitting: Internal Medicine

## 2015-03-27 DIAGNOSIS — Z1231 Encounter for screening mammogram for malignant neoplasm of breast: Secondary | ICD-10-CM | POA: Insufficient documentation

## 2015-04-03 ENCOUNTER — Encounter: Payer: Self-pay | Admitting: *Deleted

## 2015-04-03 ENCOUNTER — Emergency Department: Payer: Medicare HMO

## 2015-04-03 ENCOUNTER — Emergency Department
Admission: EM | Admit: 2015-04-03 | Discharge: 2015-04-03 | Disposition: A | Discharge status: Home / Self Care | Payer: Medicare HMO | Attending: Emergency Medicine | Admitting: Emergency Medicine

## 2015-04-03 DIAGNOSIS — S4992XA Unspecified injury of left shoulder and upper arm, initial encounter: Secondary | ICD-10-CM | POA: Diagnosis present

## 2015-04-03 DIAGNOSIS — S40012A Contusion of left shoulder, initial encounter: Secondary | ICD-10-CM | POA: Insufficient documentation

## 2015-04-03 DIAGNOSIS — Y9289 Other specified places as the place of occurrence of the external cause: Secondary | ICD-10-CM | POA: Diagnosis not present

## 2015-04-03 DIAGNOSIS — W108XXA Fall (on) (from) other stairs and steps, initial encounter: Secondary | ICD-10-CM | POA: Diagnosis not present

## 2015-04-03 DIAGNOSIS — Z87891 Personal history of nicotine dependence: Secondary | ICD-10-CM | POA: Insufficient documentation

## 2015-04-03 DIAGNOSIS — Y998 Other external cause status: Secondary | ICD-10-CM | POA: Insufficient documentation

## 2015-04-03 DIAGNOSIS — T148XXA Other injury of unspecified body region, initial encounter: Secondary | ICD-10-CM

## 2015-04-03 DIAGNOSIS — S59902A Unspecified injury of left elbow, initial encounter: Secondary | ICD-10-CM | POA: Insufficient documentation

## 2015-04-03 DIAGNOSIS — I10 Essential (primary) hypertension: Secondary | ICD-10-CM | POA: Insufficient documentation

## 2015-04-03 DIAGNOSIS — S6992XA Unspecified injury of left wrist, hand and finger(s), initial encounter: Secondary | ICD-10-CM | POA: Insufficient documentation

## 2015-04-03 DIAGNOSIS — Y9389 Activity, other specified: Secondary | ICD-10-CM | POA: Diagnosis not present

## 2015-04-03 DIAGNOSIS — S40022A Contusion of left upper arm, initial encounter: Secondary | ICD-10-CM

## 2015-04-03 MED ORDER — HYDROCODONE-ACETAMINOPHEN 5-325 MG PO TABS
2.0000 | ORAL_TABLET | Freq: Once | ORAL | Status: AC
  Administered 2015-04-03: 2 via ORAL
  Filled 2015-04-03: qty 2

## 2015-04-03 MED ORDER — CYCLOBENZAPRINE HCL 10 MG PO TABS: 10.0000 mg | ORAL_TABLET | Freq: Three times a day (TID) | ORAL | Status: DC | PRN

## 2015-04-03 MED ORDER — HYDROCODONE-ACETAMINOPHEN 5-325 MG PO TABS: 1.0000 | ORAL_TABLET | ORAL | Status: DC | PRN

## 2015-04-03 MED ORDER — IBUPROFEN 800 MG PO TABS: 800.0000 mg | ORAL_TABLET | Freq: Three times a day (TID) | ORAL | Status: DC | PRN

## 2015-04-03 NOTE — ED Provider Notes (Signed)
[facility] Emergency Department Provider Note  ____________________________________________  Time seen: Approximately 9:11 AM  I have reviewed the triage vital signs and the nursing notes.   HISTORY  Chief Complaint Fall   HPI Patricia Walker is a 69 y.o. female Presents for evaluation of left shoulder, arm and collarbone pain. Patient states that she fell down the steps prior to arrival.Denies any loss of consciousness. Current medications and vital signs reviewed. Patient states she did not hit her head. Denies any neck pain.   Past Medical History  Diagnosis Date  . Hypertension   . Coronary artery disease   . MI, old     There are no active problems to display for this patient.   History reviewed. No pertinent past surgical history.  Current Outpatient Rx  Name  Route  Sig  Dispense  Refill  . cyclobenzaprine (FLEXERIL) 10 MG tablet   Oral   Take 1 tablet (10 mg total) by mouth every 8 (eight) hours as needed for muscle spasms.   30 tablet   1   . HYDROcodone-acetaminophen (NORCO) 5-325 MG tablet   Oral   Take 1-2 tablets by mouth every 4 (four) hours as needed for moderate pain.   15 tablet   0   . ibuprofen (ADVIL,MOTRIN) 800 MG tablet   Oral   Take 1 tablet (800 mg total) by mouth every 8 (eight) hours as needed.   30 tablet   0     Allergies Review of patient's allergies indicates no known allergies.  Family History  Problem Relation Age of Onset  . Breast cancer Paternal Aunt     Social History Social History  Substance Use Topics  . Smoking status: Former Research scientist (life sciences)  . Smokeless tobacco: None  . Alcohol Use: No    Review of Systems Constitutional: No fever/chills Eyes: No visual changes. ENT: No sore throat. Cardiovascular: Denies chest pain. Respiratory: Denies shortness of breath. Gastrointestinal: No abdominal pain.  No nausea, no vomiting.  No diarrhea.  No constipation. Genitourinary: Negative for  dysuria. Musculoskeletal: Positive for left shoulder, arm, and elbow and collarbone pain. Skin: Negative for rash. Neurological: Negative for headaches, focal weakness or numbness.  10-point ROS otherwise negative.  ____________________________________________   PHYSICAL EXAM:  VITAL SIGNS: ED Triage Vitals  Enc Vitals Group     BP 04/03/15 0905 136/9 mmHg     Pulse Rate 04/03/15 0905 70     Resp 04/03/15 0905 20     Temp 04/03/15 0905 98.1 F (36.7 C)     Temp Source 04/03/15 0905 Oral     SpO2 04/03/15 0905 99 %     Weight 04/03/15 0905 300 lb (136.079 kg)     Height 04/03/15 0905 5\' 4"  (1.626 m)     Head Cir --      Peak Flow --      Pain Score 04/03/15 0906 8     Pain Loc --      Pain Edu? --      Excl. in [place]? --     Constitutional: Alert and oriented. Well appearing and in no acute distress. Eyes: Conjunctivae are normal. PERRL. EOMI. Head: Atraumatic. Nose: No congestion/rhinnorhea. Mouth/Throat: Mucous membranes are moist.  Oropharynx non-erythematous. Neck: No stridor.  Negative cervical spinal tenderness. Cardiovascular: Normal rate, regular rhythm. Grossly normal heart sounds.  Good peripheral circulation. Respiratory: Normal respiratory effort.  No retractions. Lungs CTAB. Gastrointestinal: Soft and nontender. No distention. No abdominal bruits. No CVA tenderness.  Musculoskeletal: Tenderness or to the left clavicle and left shoulder left elbow and left wrist. Some point tenderness noted to the anterior chest wall no ecchymosis or bruising noted anywhere distally neurovascularly intact, extremities. Neurologic:  Normal speech and language. No gross focal neurologic deficits are appreciated. No gait instability. Skin:  Skin is warm, dry and intact. No rash noted. Psychiatric: Mood and affect are normal. Speech and behavior are normal.  ____________________________________________   LABS (all labs ordered are listed, but only abnormal results are  displayed)  Labs Reviewed - No data to display ____________________________________________   RADIOLOGY  All radiological films negative for any acute osseous processes. No fractures dislocations or sprains evident. ____________________________________________   PROCEDURES  Procedure(s) performed: None  Critical Care performed: No  ____________________________________________   INITIAL IMPRESSION / ASSESSMENT AND PLAN / ED COURSE  Pertinent labs & imaging results that were available during my care of the patient were reviewed by me and considered in my medical decision making (see chart for details).  Status post fall with acute musculoskeletal strain/contusions. Rx given for Motrin 800 mg, Flexeril 10 mg, and Norco 5/325 #8 tablets. Patient felt PCP or return to ER as needed. ____________________________________________   FINAL CLINICAL IMPRESSION(S) / ED DIAGNOSES  Final diagnoses:  Contusion of clavicle, left, initial encounter  Shoulder contusion, left, initial encounter  Contusion, upper extremity, left, initial encounter      Arlyss Repress, PA-C 04/03/15 1055  Daymon Larsen, MD 04/03/15 1059

## 2015-04-03 NOTE — ED Notes (Signed)
This am fell going down steps c/o pain left shoulder arm, collar bone

## 2015-04-03 NOTE — Discharge Instructions (Signed)
Contusion A contusion is a deep bruise. Contusions are the result of a blunt injury to tissues and muscle fibers under the skin. The injury causes bleeding under the skin. The skin overlying the contusion Bardin turn blue, purple, or yellow. Minor injuries will give you a painless contusion, but more severe contusions Koziel stay painful and swollen for a few weeks.  CAUSES  This condition is usually caused by a blow, trauma, or direct force to an area of the body. SYMPTOMS  Symptoms of this condition include:  Swelling of the injured area.  Pain and tenderness in the injured area.  Discoloration. The area Radle have redness and then turn blue, purple, or yellow. DIAGNOSIS  This condition is diagnosed based on a physical exam and medical history. An X-ray, CT scan, or MRI Yodice be needed to determine if there are any associated injuries, such as broken bones (fractures). TREATMENT  Specific treatment for this condition depends on what area of the body was injured. In general, the best treatment for a contusion is resting, icing, applying pressure to (compression), and elevating the injured area. This is often called the RICE strategy. Over-the-counter anti-inflammatory medicines Shiroma also be recommended for pain control.  HOME CARE INSTRUCTIONS   Rest the injured area.  If directed, apply ice to the injured area:  Put ice in a plastic bag.  Place a towel between your skin and the bag.  Leave the ice on for 20 minutes, 2-3 times per day.  If directed, apply light compression to the injured area using an elastic bandage. Make sure the bandage is not wrapped too tightly. Remove and reapply the bandage as directed by your health care provider.  If possible, raise (elevate) the injured area above the level of your heart while you are sitting or lying down.  Take over-the-counter and prescription medicines only as told by your health care provider. SEEK MEDICAL CARE IF:  Your symptoms do not  improve after several days of treatment.  Your symptoms get worse.  You have difficulty moving the injured area. SEEK IMMEDIATE MEDICAL CARE IF:   You have severe pain.  You have numbness in a hand or foot.  Your hand or foot turns pale or cold.   This information is not intended to replace advice given to you by your health care provider. Make sure you discuss any questions you have with your health care provider.   Document Released: 01/05/2005 Document Revised: 12/17/2014 Document Reviewed: 08/13/2014 Elsevier Interactive Patient Education 2016 Elsevier Inc.  

## 2015-04-26 ENCOUNTER — Encounter: Payer: Self-pay | Admitting: Emergency Medicine

## 2015-04-26 ENCOUNTER — Emergency Department
Admission: EM | Admit: 2015-04-26 | Discharge: 2015-04-26 | Disposition: A | Discharge status: Home / Self Care | Payer: Commercial Managed Care - HMO | Attending: Emergency Medicine | Admitting: Emergency Medicine

## 2015-04-26 DIAGNOSIS — R55 Syncope and collapse: Secondary | ICD-10-CM | POA: Insufficient documentation

## 2015-04-26 DIAGNOSIS — Z87891 Personal history of nicotine dependence: Secondary | ICD-10-CM | POA: Diagnosis not present

## 2015-04-26 DIAGNOSIS — R42 Dizziness and giddiness: Secondary | ICD-10-CM | POA: Insufficient documentation

## 2015-04-26 DIAGNOSIS — I10 Essential (primary) hypertension: Secondary | ICD-10-CM | POA: Insufficient documentation

## 2015-04-26 DIAGNOSIS — R001 Bradycardia, unspecified: Secondary | ICD-10-CM | POA: Insufficient documentation

## 2015-04-26 LAB — BASIC METABOLIC PANEL
Anion gap: 7 (ref 5–15)
BUN: 24 mg/dL — AB (ref 6–20)
CALCIUM: 8.8 mg/dL — AB (ref 8.9–10.3)
CHLORIDE: 104 mmol/L (ref 101–111)
CO2: 26 mmol/L (ref 22–32)
CREATININE: 1.32 mg/dL — AB (ref 0.44–1.00)
GFR calc non Af Amer: 40 mL/min — ABNORMAL LOW (ref >60)
GFR, EST AFRICAN AMERICAN: 47 mL/min — AB (ref >60)
Glucose, Bld: 123 mg/dL — ABNORMAL HIGH (ref 65–99)
Potassium: 4.2 mmol/L (ref 3.5–5.1)
SODIUM: 137 mmol/L (ref 135–145)

## 2015-04-26 LAB — CBC
HCT: 43.9 % (ref 35.0–47.0)
Hemoglobin: 14.5 g/dL (ref 12.0–16.0)
MCH: 27.7 pg (ref 26.0–34.0)
MCHC: 32.9 g/dL (ref 32.0–36.0)
MCV: 84.2 fL (ref 80.0–100.0)
PLATELETS: 280 10*3/uL (ref 150–440)
RBC: 5.22 MIL/uL — ABNORMAL HIGH (ref 3.80–5.20)
RDW: 14.1 % (ref 11.5–14.5)
WBC: 7.8 10*3/uL (ref 3.6–11.0)

## 2015-04-26 LAB — URINALYSIS COMPLETE WITH MICROSCOPIC (ARMC ONLY)
Bilirubin Urine: NEGATIVE
Glucose, UA: NEGATIVE mg/dL
Hgb urine dipstick: NEGATIVE
Ketones, ur: NEGATIVE mg/dL
Nitrite: NEGATIVE
Protein, ur: NEGATIVE mg/dL
Specific Gravity, Urine: 1.023 (ref 1.005–1.030)
pH: 5 (ref 5.0–8.0)

## 2015-04-26 LAB — T4, FREE: Free T4: 0.87 ng/dL (ref 0.61–1.12)

## 2015-04-26 LAB — TROPONIN I: Troponin I: <0.03 ng/mL

## 2015-04-26 LAB — GLUCOSE, CAPILLARY: Glucose-Capillary: 117 mg/dL — ABNORMAL HIGH (ref 65–99)

## 2015-04-26 MED ORDER — SODIUM CHLORIDE 0.9 % IV SOLN
Freq: Once | INTRAVENOUS | Status: AC
  Administered 2015-04-26: 16:00:00 via INTRAVENOUS

## 2015-04-26 MED ORDER — METOPROLOL SUCCINATE ER 25 MG PO TB24: 25.0000 mg | ORAL_TABLET | Freq: Every day | ORAL | Status: DC

## 2015-04-26 NOTE — ED Notes (Signed)
Dizziness, on and off , near syncope

## 2015-04-26 NOTE — Discharge Instructions (Signed)
Dizziness °Dizziness is a common problem. It is a feeling of unsteadiness or light-headedness. You [place] feel like you are about to faint. Dizziness can lead to injury if you stumble or fall. Anyone can become dizzy, but dizziness is more common in older adults. This condition can be caused by a number of things, including medicines, dehydration, or illness. °HOME CARE INSTRUCTIONS °Taking these steps Groh help with your condition: °Eating and Drinking °· Drink enough fluid to keep your urine clear or pale yellow. This helps to keep you from becoming dehydrated. Try to drink more clear fluids, such as water. °· Do not drink alcohol. °· Limit your caffeine intake if directed by your health care provider. °· Limit your salt intake if directed by your health care provider. °Activity °· Avoid making quick movements. °¨ Rise slowly from chairs and steady yourself until you feel okay. °¨ In the morning, first sit up on the side of the bed. When you feel okay, stand slowly while you hold onto something until you know that your balance is fine. °· Move your legs often if you need to stand in one place for a long time. Tighten and relax your muscles in your legs while you are standing. °· Do not drive or operate heavy machinery if you feel dizzy. °· Avoid bending down if you feel dizzy. Place items in your home so that they are easy for you to reach without leaning over. °Lifestyle °· Do not use any tobacco products, including cigarettes, chewing tobacco, or electronic cigarettes. If you need help quitting, ask your health care provider. °· Try to reduce your stress level, such as with yoga or meditation. Talk with your health care provider if you need help. °General Instructions °· Watch your dizziness for any changes. °· Take medicines only as directed by your health care provider. Talk with your health care provider if you think that your dizziness is caused by a medicine that you are taking. °· Tell a friend or a family  member that you are feeling dizzy. If he or she notices any changes in your behavior, have this person call your health care provider. °· Keep all follow-up visits as directed by your health care provider. This is important. °SEEK MEDICAL CARE IF: °· Your dizziness does not go away. °· Your dizziness or light-headedness gets worse. °· You feel nauseous. °· You have reduced hearing. °· You have new symptoms. °· You are unsteady on your feet or you feel like the room is spinning. °SEEK IMMEDIATE MEDICAL CARE IF: °· You vomit or have diarrhea and are unable to eat or drink anything. °· You have problems talking, walking, swallowing, or using your arms, hands, or legs. °· You feel generally weak. °· You are not thinking clearly or you have trouble forming sentences. It Hornstein take a friend or family member to notice this. °· You have chest pain, abdominal pain, shortness of breath, or sweating. °· Your vision changes. °· You notice any bleeding. °· You have a headache. °· You have neck pain or a stiff neck. °· You have a fever. °  °This information is not intended to replace advice given to you by your health care provider. Make sure you discuss any questions you have with your health care provider. °  °Document Released: 09/21/2000 Document Revised: 08/12/2014 Document Reviewed: 03/24/2014 °Elsevier Interactive Patient Education ©2016 Elsevier Inc. ° °

## 2015-04-26 NOTE — ED Notes (Signed)
Patient presents to the ED with dizziness that began yesterday afternoon and feeling, "like I'm going to pass out."  Patient reports having felt this way in the past.  Patient is in no obvious distress at this time, is alert and oriented x 4.  Patient's heart rate is in the 40s in triage.

## 2015-04-26 NOTE — ED Provider Notes (Signed)
[facility] Emergency Department Provider Note     Time seen: ----------------------------------------- 3:09 PM on 04/26/2015 -----------------------------------------    I have reviewed the triage vital signs and the nursing notes.   HISTORY  Chief Complaint Near Syncope and Dizziness    HPI Patricia Walker is a 70 y.o. female who presents ER for dizziness that began yesterday afternoon feeling like she was given a pass out. Patient reports having felt this way in the past. She denies fevers, chills, chest pain, shortness of breath, nausea vomiting or diarrhea. On arrival heart rate was in the 40s. Patient states she's been eating and drinking normally, no change in her medicines.   Past Medical History  Diagnosis Date  . Hypertension   . Coronary artery disease   . MI, old     There are no active problems to display for this patient.   History reviewed. No pertinent past surgical history.  Allergies Review of patient's allergies indicates no known allergies.  Social History Social History  Substance Use Topics  . Smoking status: Former Research scientist (life sciences)  . Smokeless tobacco: None  . Alcohol Use: No    Review of Systems Constitutional: Negative for fever. Eyes: Negative for visual changes. ENT: Negative for sore throat. Cardiovascular: Negative for chest pain. Respiratory: Negative for shortness of breath. Gastrointestinal: Negative for abdominal pain, vomiting and diarrhea. Genitourinary: Negative for dysuria. Musculoskeletal: Negative for back pain. Skin: Negative for rash. Neurological: Negative for headaches,positive for dizziness  10-point ROS otherwise negative.  ____________________________________________   PHYSICAL EXAM:  VITAL SIGNS: ED Triage Vitals  Enc Vitals Group     BP 04/26/15 1253 124/77 mmHg     Pulse Rate 04/26/15 1253 47     Resp 04/26/15 1253 20     Temp 04/26/15 1253 97.5 F (36.4 C)     Temp Source 04/26/15  1253 Oral     SpO2 04/26/15 1253 93 %     Weight 04/26/15 1253 300 lb (136.079 kg)     Height 04/26/15 1253 5\' 4"  (1.626 m)     Head Cir --      Peak Flow --      Pain Score 04/26/15 1253 0     Pain Loc --      Pain Edu? --      Excl. in [place]? --     Constitutional: Alert and oriented. Well appearing and in no distress. Eyes: Conjunctivae are normal. PERRL. Normal extraocular movements. ENT   Head: Normocephalic and atraumatic.   Nose: No congestion/rhinnorhea.   Mouth/Throat: Mucous membranes are moist.   Neck: No stridor.no audible bruits Cardiovascular: Normal rate, regular rhythm. Normal and symmetric distal pulses are present in all extremities. No murmurs, rubs, or gallops. Respiratory: Normal respiratory effort without tachypnea nor retractions. Breath sounds are clear and equal bilaterally. No wheezes/rales/rhonchi. Gastrointestinal: Soft and nontender. No distention. No abdominal bruits.  Musculoskeletal: Nontender with normal range of motion in all extremities. No joint effusions.  No lower extremity tenderness nor edema. Neurologic:  Normal speech and language. No gross focal neurologic deficits are appreciated. Speech is normal.  Skin:  Skin is warm, dry and intact. No rash noted. Psychiatric: Mood and affect are normal. Speech and behavior are normal. Patient exhibits appropriate insight and judgment. ____________________________________________  EKG: Interpreted by me.patient fibrillation with a slow ventricular response with a rate of 52 bpm, incomplete left bundle branch block, LVH, ST and T-wave changes, normal axis.  ____________________________________________  ED COURSE:  Pertinent labs &  imaging results that were available during my care of the patient were reviewed by me and considered in my medical decision making (see chart for details). Patient is no acute distress, will check basic labs and  reevaluate. ____________________________________________    LABS (pertinent positives/negatives)  Labs Reviewed  BASIC METABOLIC PANEL - Abnormal; Notable for the following:    Glucose, Bld 123 (*)    BUN 24 (*)    Creatinine, Ser 1.32 (*)    Calcium 8.8 (*)    GFR calc non Af Amer 40 (*)    GFR calc Af Amer 47 (*)    All other components within normal limits  CBC - Abnormal; Notable for the following:    RBC 5.22 (*)    All other components within normal limits  URINALYSIS COMPLETEWITH MICROSCOPIC ([facility] ONLY) - Abnormal; Notable for the following:    Color, Urine YELLOW (*)    APPearance CLEAR (*)    Leukocytes, UA TRACE (*)    Bacteria, UA RARE (*)    Squamous Epithelial / LPF 0-5 (*)    All other components within normal limits  GLUCOSE, CAPILLARY - Abnormal; Notable for the following:    Glucose-Capillary 117 (*)    All other components within normal limits  TROPONIN I  T4, FREE  CBG MONITORING, ED    ____________________________________________  FINAL ASSESSMENT AND PLAN  Dizziness, mild bradycardia   Plan: Patient with labs and imaging as dictated above. Patient is in no acute distress, seems a little dehydrated based on her lab work. I'm also going to half her dose of metoprolol and have her see her cardiologist in the next several days for reevaluation   Earleen Newport, MD   Earleen Newport, MD 04/26/15 [phone]

## 2015-09-09 DIAGNOSIS — I499 Cardiac arrhythmia, unspecified: Secondary | ICD-10-CM | POA: Insufficient documentation

## 2015-09-09 DIAGNOSIS — K219 Gastro-esophageal reflux disease without esophagitis: Secondary | ICD-10-CM | POA: Insufficient documentation

## 2015-09-09 DIAGNOSIS — G4733 Obstructive sleep apnea (adult) (pediatric): Secondary | ICD-10-CM | POA: Insufficient documentation

## 2015-09-09 DIAGNOSIS — L409 Psoriasis, unspecified: Secondary | ICD-10-CM | POA: Insufficient documentation

## 2015-10-23 ENCOUNTER — Emergency Department
Admission: EM | Admit: 2015-10-23 | Discharge: 2015-10-24 | Disposition: A | Discharge status: Home / Self Care | Payer: Medicare HMO | Attending: Emergency Medicine | Admitting: Emergency Medicine

## 2015-10-23 ENCOUNTER — Emergency Department: Payer: Medicare HMO

## 2015-10-23 DIAGNOSIS — I10 Essential (primary) hypertension: Secondary | ICD-10-CM | POA: Diagnosis not present

## 2015-10-23 DIAGNOSIS — Z79899 Other long term (current) drug therapy: Secondary | ICD-10-CM | POA: Diagnosis not present

## 2015-10-23 DIAGNOSIS — I2510 Atherosclerotic heart disease of native coronary artery without angina pectoris: Secondary | ICD-10-CM | POA: Diagnosis not present

## 2015-10-23 DIAGNOSIS — Z87891 Personal history of nicotine dependence: Secondary | ICD-10-CM | POA: Diagnosis not present

## 2015-10-23 DIAGNOSIS — M7989 Other specified soft tissue disorders: Secondary | ICD-10-CM

## 2015-10-23 DIAGNOSIS — I252 Old myocardial infarction: Secondary | ICD-10-CM | POA: Insufficient documentation

## 2015-10-23 DIAGNOSIS — M79604 Pain in right leg: Secondary | ICD-10-CM | POA: Diagnosis present

## 2015-10-23 DIAGNOSIS — G629 Polyneuropathy, unspecified: Secondary | ICD-10-CM

## 2015-10-23 LAB — BASIC METABOLIC PANEL
Anion gap: 8 (ref 5–15)
BUN: 22 mg/dL — AB (ref 6–20)
CALCIUM: 9.4 mg/dL (ref 8.9–10.3)
CHLORIDE: 100 mmol/L — AB (ref 101–111)
CO2: 27 mmol/L (ref 22–32)
CREATININE: 1.06 mg/dL — AB (ref 0.44–1.00)
GFR calc Af Amer: >60 mL/min (ref >60)
GFR, EST NON AFRICAN AMERICAN: 52 mL/min — AB (ref >60)
Glucose, Bld: 142 mg/dL — ABNORMAL HIGH (ref 65–99)
Potassium: 4.1 mmol/L (ref 3.5–5.1)
SODIUM: 135 mmol/L (ref 135–145)

## 2015-10-23 LAB — CBC WITH DIFFERENTIAL/PLATELET
BASOS PCT: 1 %
Basophils Absolute: 0.1 10*3/uL (ref 0–0.1)
EOS ABS: 0.3 10*3/uL (ref 0–0.7)
EOS PCT: 3 %
HCT: 45.9 % (ref 35.0–47.0)
Hemoglobin: 15.9 g/dL (ref 12.0–16.0)
LYMPHS ABS: 2.1 10*3/uL (ref 1.0–3.6)
Lymphocytes Relative: 24 %
MCH: 29.2 pg (ref 26.0–34.0)
MCHC: 34.5 g/dL (ref 32.0–36.0)
MCV: 84.6 fL (ref 80.0–100.0)
Monocytes Absolute: 0.8 10*3/uL (ref 0.2–0.9)
Monocytes Relative: 9 %
Neutro Abs: 5.7 10*3/uL (ref 1.4–6.5)
Neutrophils Relative %: 63 %
PLATELETS: 293 10*3/uL (ref 150–440)
RBC: 5.43 MIL/uL — AB (ref 3.80–5.20)
RDW: 14 % (ref 11.5–14.5)
WBC: 9 10*3/uL (ref 3.6–11.0)

## 2015-10-23 MED ORDER — GABAPENTIN 300 MG PO CAPS
300.0000 mg | ORAL_CAPSULE | Freq: Once | ORAL | Status: AC
  Administered 2015-10-23: 300 mg via ORAL
  Filled 2015-10-23: qty 1

## 2015-10-23 MED ORDER — IBUPROFEN 600 MG PO TABS
ORAL_TABLET | ORAL | Status: AC
  Filled 2015-10-23: qty 1

## 2015-10-23 MED ORDER — IBUPROFEN 600 MG PO TABS
600.0000 mg | ORAL_TABLET | Freq: Once | ORAL | Status: AC
  Administered 2015-10-23: 600 mg via ORAL

## 2015-10-23 MED ORDER — GABAPENTIN 300 MG PO CAPS: 300.0000 mg | ORAL_CAPSULE | Freq: Every day | ORAL | Status: DC

## 2015-10-23 NOTE — ED Provider Notes (Signed)
-----------------------------------------   11:49 PM on 10/23/2015 -----------------------------------------   Blood pressure 123/74, pulse 46, temperature 98.1 F (36.7 C), temperature source Oral, resp. rate 18, height 5\' 4"  (1.626 m), weight 136.079 kg, SpO2 98 %.  Assuming care from Dr. [name].  In short, Patricia Walker is a 70 y.o. female with a chief complaint of Leg Pain .  Refer to the original H&P for additional details.  The current plan of care is to follow up ultrasounds and reassess.   ----------------------------------------- 1:07 AM on 10/24/2015 -----------------------------------------  US Venous Img Lower Bilateral  10/24/2015  CLINICAL DATA:  Bilateral leg swelling and pain since last night. History of previous DVT. EXAM: BILATERAL LOWER EXTREMITY VENOUS DOPPLER ULTRASOUND TECHNIQUE: Gray-scale sonography with graded compression, as well as color Doppler and duplex ultrasound were performed to evaluate the lower extremity deep venous systems from the level of the common femoral vein and including the common femoral, femoral, profunda femoral, popliteal and calf veins including the posterior tibial, peroneal and gastrocnemius veins when visible. The superficial great saphenous vein was also interrogated. Spectral Doppler was utilized to evaluate flow at rest and with distal augmentation maneuvers in the common femoral, femoral and popliteal veins. COMPARISON:  None. FINDINGS: RIGHT LOWER EXTREMITY Common Femoral Vein: No evidence of thrombus. Normal compressibility, respiratory phasicity and response to augmentation. Saphenofemoral Junction: No evidence of thrombus. Normal compressibility and flow on color Doppler imaging. Profunda Femoral Vein: No evidence of thrombus. Normal compressibility and flow on color Doppler imaging. Femoral Vein: No evidence of thrombus. Normal compressibility, respiratory phasicity and response to augmentation. Popliteal Vein: No evidence of thrombus.  Normal compressibility, respiratory phasicity and response to augmentation. Calf Veins: No evidence of thrombus. Normal compressibility and flow on color Doppler imaging. Superficial Great Saphenous Vein: No evidence of thrombus. Normal compressibility and flow on color Doppler imaging. Venous Reflux:  None. Other Findings:  None. LEFT LOWER EXTREMITY Common Femoral Vein: No evidence of thrombus. Normal compressibility, respiratory phasicity and response to augmentation. Saphenofemoral Junction: No evidence of thrombus. Normal compressibility and flow on color Doppler imaging. Profunda Femoral Vein: No evidence of thrombus. Normal compressibility and flow on color Doppler imaging. Femoral Vein: No evidence of thrombus. Normal compressibility, respiratory phasicity and response to augmentation. Popliteal Vein: No evidence of thrombus. Normal compressibility, respiratory phasicity and response to augmentation. Calf Veins: No evidence of thrombus. Normal compressibility and flow on color Doppler imaging. Superficial Great Saphenous Vein: No evidence of thrombus. Normal compressibility and flow on color Doppler imaging. Venous Reflux:  None. Other Findings:  None. IMPRESSION: No evidence of deep venous thrombosis. Electronically Signed   By: Lucienne Capers M.D.   On: 10/24/2015 00:36     No Evidence of DVT.  Patient is comfortable and was sleeping when I checked on her.  She will follow up as an outpatient as per the discharge instructions by Dr. Leonia Corona, MD 10/24/15 [phone]

## 2015-10-23 NOTE — ED Notes (Signed)
Pt ambulatory to triage with no difficulty. Pt reports pain to both legs intermittently for some time but worse last night. Pt reports Dr Wolfgang Phoenix put her on a muscle relaxer for spasms and cramps but does not seem to be helping. Pt also reports she has hx of a blood clot in both legs over a year ago and is on eliquis. Pt does reports her legs are swollen. Pt has swelling noted to her right knee region.

## 2015-10-23 NOTE — Discharge Instructions (Signed)
You were evaluated for leg pain, and your exam and evaluation are reassuring. I'm suspicious of neuropathy as a source of ear discomfort. You're being started on gabapentin to help with this. Next the next and follow-up with her primary care physician.  Emergency department for any worsening condition including weakness or numbness, redness, new or worsening swelling, or any other symptoms concerning to you.  Neuropathic Pain Neuropathic pain is pain caused by damage to the nerves that are responsible for certain sensations in your body (sensory nerves). The pain can be caused by damage to:   The sensory nerves that send signals to your spinal cord and brain (peripheral nervous system).  The sensory nerves in your brain or spinal cord (central nervous system). Neuropathic pain can make you more sensitive to pain. What would be a minor sensation for most people Calloway feel very painful if you have neuropathic pain. This is usually a long-term condition that can be difficult to treat. The type of pain can differ from person to person. It Antonelli start suddenly (acute), or it Salahuddin develop slowly and last for a long time (chronic). Neuropathic pain Mancillas come and go as damaged nerves heal or Wichert stay at the same level for years. It often causes emotional distress, loss of sleep, and a lower quality of life. CAUSES  The most common cause of damage to a sensory nerve is diabetes. Many other diseases and conditions can also cause neuropathic pain. Causes of neuropathic pain can be classified as:  Toxic. Many drugs and chemicals can cause toxic damage. The most common cause of toxic neuropathic pain is damage from drug treatment for cancer (chemotherapy).  Metabolic. This type of pain can happen when a disease causes imbalances that damage nerves. Diabetes is the most common of these diseases. Vitamin B deficiency caused by long-term alcohol abuse is another common cause.  Traumatic. Any injury that cuts, crushes,  or stretches a nerve can cause damage and pain. A common example is feeling pain after losing an arm or leg (phantom limb pain).  Compression-related. If a sensory nerve gets trapped or compressed for a long period of time, the blood supply to the nerve can be cut off.  Vascular. Many blood vessel diseases can cause neuropathic pain by decreasing blood supply and oxygen to nerves.  Autoimmune. This type of pain results from diseases in which the body's defense system mistakenly attacks sensory nerves. Examples of autoimmune diseases that can cause neuropathic pain include lupus and multiple sclerosis.  Infectious. Many types of viral infections can damage sensory nerves and cause pain. Shingles infection is a common cause of this type of pain.  Inherited. Neuropathic pain can be a symptom of many diseases that are passed down through families (genetic). SIGNS AND SYMPTOMS  The main symptom is pain. Neuropathic pain is often described as:  Burning.  Shock-like.  Stinging.  Hot or cold.  Itching. DIAGNOSIS  No single test can diagnose neuropathic pain. Your health care provider will do a physical exam and ask you about your pain. You Dubow use a pain scale to describe how bad your pain is. You Wherley also have tests to see if you have a high sensitivity to pain and to help find the cause and location of any sensory nerve damage. These tests Hyden include:  Imaging studies, such as:  X-rays.  CT scan.  MRI.  Nerve conduction studies to test how well nerve signals travel through your sensory nerves (electrodiagnostic testing).  Stimulating your  sensory nerves through electrodes on your skin and measuring the response in your spinal cord and brain (somatosensory evoked potentials). TREATMENT  Treatment for neuropathic pain Thul change over time. You Bruun need to try different treatment options or a combination of treatments. Some options include:  Over-the-counter pain  relievers.  Prescription medicines. Some medicines used to treat other conditions Waln also help neuropathic pain. These include medicines to:  Control seizures (anticonvulsants).  Relieve depression (antidepressants).  Prescription-strength pain relievers (narcotics). These are usually used when other pain relievers do not help.  Transcutaneous nerve stimulation (TENS). This uses electrical currents to block painful nerve signals. The treatment is painless.  Topical and local anesthetics. These are medicines that numb the nerves. They can be injected as a nerve block or applied to the skin.  Alternative treatments, such as:  Acupuncture.  Meditation.  Massage.  Physical therapy.  Pain management programs.  Counseling. HOME CARE INSTRUCTIONS  Learn as much as you can about your condition.  Take medicines only as directed by your health care provider.  Work closely with all your health care providers to find what works best for you.  Have a good support system at home.  Consider joining a chronic pain support group. SEEK MEDICAL CARE IF:  Your pain treatments are not helping.  You are having side effects from your medicines.  You are struggling with fatigue, mood changes, depression, or anxiety.   This information is not intended to replace advice given to you by your health care provider. Make sure you discuss any questions you have with your health care provider.   Document Released: 12/24/2003 Document Revised: 04/18/2014 Document Reviewed: 09/05/2013 Elsevier Interactive Patient Education [employer].

## 2015-10-23 NOTE — ED Provider Notes (Signed)
[facility] Emergency Department Provider Note   ____________________________________________  Time seen:  I have reviewed the triage vital signs and the triage nursing note.  HISTORY  Chief Complaint Leg Pain   Historian Patient  HPI Patricia Walker is a 70 y.o. 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 Symptoms started a few days ago, and she was seen at her primary care physician's office yesterday and diagnosed with muscular cramps and started on a muscle relaxer, Flexeril. She tried this yesterday and it didn't seem to help much and she was up all night long due to her legs hurting. Unclear whether she's had any additional swelling. There is no redness or skin rash. No fevers. This [place] or trouble breathing.    Past Medical History  Diagnosis Date  . Hypertension   . Coronary artery disease   . MI, old     There are no active problems to display for this patient.   No past surgical history on file.  Current Outpatient Rx  Name  Route  Sig  Dispense  Refill  . amiodarone (PACERONE) 200 MG tablet   Oral   Take 400 mg by mouth 2 (two) times daily.         [name] Kitchen apixaban (ELIQUIS) 5 MG TABS tablet   Oral   Take 5 mg by mouth 2 (two) times daily.         . cetirizine (ZYRTEC) 10 MG tablet   Oral   Take 10 mg by mouth daily.         Hillary Bow SENSOREADY 300 DOSE 150 MG/ML SOAJ   Subcutaneous   Inject 300 mg into the skin every 30 (thirty) days.           Dispense as written.   . cyclobenzaprine (FLEXERIL) 10 MG tablet   Oral   Take 1 tablet (10 mg total) by mouth every 8 (eight) hours as needed for muscle spasms.   30 tablet   1   . esomeprazole (NEXIUM) 40 MG capsule   Oral   Take 40 mg by mouth daily at 12 noon.         . furosemide (LASIX) 20 MG tablet   Oral   Take 20 mg by mouth daily as needed for fluid or edema.         . isosorbide  mononitrate (IMDUR) 30 MG 24 hr tablet   Oral   Take 30 mg by mouth daily.         [name] Kitchen levothyroxine (SYNTHROID, LEVOTHROID) 50 MCG tablet   Oral   Take 50 mcg by mouth daily before breakfast.         . losartan-hydrochlorothiazide (HYZAAR) 50-12.5 MG tablet   Oral   Take 1 tablet by mouth daily.         . metoprolol succinate (TOPROL XL) 25 MG 24 hr tablet   Oral   Take 1 tablet (25 mg total) by mouth daily.   30 tablet   11   . ondansetron (ZOFRAN) 4 MG tablet   Oral   Take 4 mg by mouth 2 (two) times daily as needed for nausea or vomiting.         . pregabalin (LYRICA) 50 MG capsule   Oral   Take 50 mg by mouth 2 (two) times daily.         . ranolazine (RANEXA) 500 MG  12 hr tablet   Oral   Take 1,000 mg by mouth 2 (two) times daily.         [name] Kitchen gabapentin (NEURONTIN) 300 MG capsule   Oral   Take 1 capsule (300 mg total) by mouth at bedtime.   30 capsule   0   . HYDROcodone-acetaminophen (NORCO) 5-325 MG tablet   Oral   Take 1-2 tablets by mouth every 4 (four) hours as needed for moderate pain. Patient not taking: Reported on 10/23/2015   15 tablet   0   . ibuprofen (ADVIL,MOTRIN) 800 MG tablet   Oral   Take 1 tablet (800 mg total) by mouth every 8 (eight) hours as needed. Patient not taking: Reported on 10/23/2015   30 tablet   0     Allergies Other  Family History  Problem Relation Age of Onset  . Breast cancer Paternal Aunt     Social History Social History  Substance Use Topics  . Smoking status: Former Research scientist (life sciences)  . Smokeless tobacco: Not on file  . Alcohol Use: No    Review of Systems  Constitutional: Negative for fever. Eyes: Negative for visual changes. ENT: Negative for sore throat. Cardiovascular: Negative for chest pain. Respiratory: Negative for shortness of breath. Gastrointestinal: Negative for abdominal pain, vomiting and diarrhea. Genitourinary: Negative for dysuria. Musculoskeletal: Negative for back pain. Skin:  Negative for rash. Neurological: Negative for headache. 10 point Review of Systems otherwise negative ____________________________________________   PHYSICAL EXAM:  VITAL SIGNS: ED Triage Vitals  Enc Vitals Group     BP 10/23/15 2053 125/95 mmHg     Pulse Rate 10/23/15 2053 68     Resp 10/23/15 2053 18     Temp 10/23/15 2053 98.3 F (36.8 C)     Temp Source 10/23/15 2053 Oral     SpO2 10/23/15 2053 96 %     Weight 10/23/15 2053 300 lb (136.079 kg)     Height 10/23/15 2053 5\' 4"  (1.626 m)     Head Cir --      Peak Flow --      Pain Score 10/23/15 2055 7     Pain Loc --      Pain Edu? --      Excl. in [place]? --      Constitutional: Alert and oriented. Well appearing and in no distress. HEENT   Head: Normocephalic and atraumatic.      Eyes: Conjunctivae are normal. PERRL. Normal extraocular movements.      Ears:         Nose: No congestion/rhinnorhea.   Mouth/Throat: Mucous membranes are moist.   Neck: No stridor. Cardiovascular/Chest: Normal rate, regular rhythm.  No murmurs, rubs, or gallops. Respiratory: Normal respiratory effort without tachypnea nor retractions. Breath sounds are clear and equal bilaterally. No wheezes/rales/rhonchi. Gastrointestinal: Soft. No distention, no guarding, no rebound. Nontender.  Obese  Genitourinary/rectal:Deferred Musculoskeletal: Trace lower extremity edema bilaterally. Tender lower extremities from the thigh down to the feet to palpation. No skin rash. Neurologic:  Normal speech and language. No gross or focal neurologic deficits are appreciated. Skin:  Skin is warm, dry and intact. No rash noted. Psychiatric: Mood and affect are normal. Speech and behavior are normal. Patient exhibits appropriate insight and judgment.  ____________________________________________   EKG I, Lisa Roca, MD, the attending physician have personally viewed and interpreted all ECGs.  None ____________________________________________  LABS  (pertinent positives/negatives)  Labs Reviewed  BASIC METABOLIC PANEL - Abnormal; Notable for the following:  Chloride 100 (*)    Glucose, Bld 142 (*)    BUN 22 (*)    Creatinine, Ser 1.06 (*)    GFR calc non Af Amer 52 (*)    All other components within normal limits  CBC WITH DIFFERENTIAL/PLATELET - Abnormal; Notable for the following:    RBC 5.43 (*)    All other components within normal limits    ____________________________________________  RADIOLOGY All Xrays were viewed by me. Imaging interpreted by Radiologist.  Ultrasound bilateral LOWER EXTREMITies:  Pending __________________________________________  PROCEDURES  Procedure(s) performed: None  Critical Care performed: None  ____________________________________________   ED COURSE / ASSESSMENT AND PLAN  Pertinent labs & imaging results that were available during my care of the patient were reviewed by me and considered in my medical decision making (see chart for details).   This patient is here with bilateral lower extremity pains. It seems more likely neuropathy clinically. I'm going to send laboratory studies to ensure she does not have an electrolyte abnormality predisposing her to muscle spasms.  Given her history of DVT she is on Eloquis, but I will obtain an ultrasound to ensure there is no worsening.   Ultimately I am going to start her on gabapentin for neuropathic pain. I'm going to refer her back to her primary care physician providing no new or recurrent/progressive DVTs.  Patient care transferred to Dr. Karma Greaser at shift change 12 AM. Ultrasound pending. If negative Strahan be discharged with my prepared discharge instructions were to a party discussed with the patient.    CONSULTATIONS:   None   Patient / Family / Caregiver informed of clinical course, medical decision-making process, and agree with plan.   I discussed return precautions, follow-up instructions, and discharged instructions  with patient and/or family.   ___________________________________________   FINAL CLINICAL IMPRESSION(S) / ED DIAGNOSES   Final diagnoses:  Neuropathy ([place])              Note: This dictation was prepared with Dragon dictation. Any transcriptional errors that result from this process are unintentional   Lisa Roca, MD 10/24/15 0002

## 2015-10-23 NOTE — ED Notes (Signed)
Attempted to call us x2 to see how much longer before patient is taken for US venous study.  Unable to get a hold of ultrasound department.  Patient notified of having an unsure answer for time on when she is taken.  Patient given water.

## 2015-10-23 NOTE — ED Notes (Signed)
MD at bedside. 

## 2015-12-27 ENCOUNTER — Encounter: Payer: Self-pay | Admitting: Emergency Medicine

## 2015-12-27 ENCOUNTER — Emergency Department: Payer: Medicare HMO

## 2015-12-27 ENCOUNTER — Emergency Department
Admission: EM | Admit: 2015-12-27 | Discharge: 2015-12-27 | Disposition: A | Discharge status: Home / Self Care | Payer: Medicare HMO | Attending: Emergency Medicine | Admitting: Emergency Medicine

## 2015-12-27 DIAGNOSIS — Z791 Long term (current) use of non-steroidal anti-inflammatories (NSAID): Secondary | ICD-10-CM | POA: Diagnosis not present

## 2015-12-27 DIAGNOSIS — I252 Old myocardial infarction: Secondary | ICD-10-CM | POA: Insufficient documentation

## 2015-12-27 DIAGNOSIS — R0789 Other chest pain: Secondary | ICD-10-CM

## 2015-12-27 DIAGNOSIS — I2510 Atherosclerotic heart disease of native coronary artery without angina pectoris: Secondary | ICD-10-CM | POA: Insufficient documentation

## 2015-12-27 DIAGNOSIS — Z87891 Personal history of nicotine dependence: Secondary | ICD-10-CM | POA: Insufficient documentation

## 2015-12-27 DIAGNOSIS — R079 Chest pain, unspecified: Secondary | ICD-10-CM

## 2015-12-27 DIAGNOSIS — Z79899 Other long term (current) drug therapy: Secondary | ICD-10-CM | POA: Diagnosis not present

## 2015-12-27 DIAGNOSIS — I10 Essential (primary) hypertension: Secondary | ICD-10-CM | POA: Insufficient documentation

## 2015-12-27 LAB — CBC
HCT: 42.3 % (ref 35.0–47.0)
Hemoglobin: 14.2 g/dL (ref 12.0–16.0)
MCH: 28.4 pg (ref 26.0–34.0)
MCHC: 33.6 g/dL (ref 32.0–36.0)
MCV: 84.7 fL (ref 80.0–100.0)
PLATELETS: 266 10*3/uL (ref 150–440)
RBC: 4.99 MIL/uL (ref 3.80–5.20)
RDW: 14.3 % (ref 11.5–14.5)
WBC: 8.7 10*3/uL (ref 3.6–11.0)

## 2015-12-27 LAB — BASIC METABOLIC PANEL
ANION GAP: 6 (ref 5–15)
BUN: 21 mg/dL — ABNORMAL HIGH (ref 6–20)
CALCIUM: 8.9 mg/dL (ref 8.9–10.3)
CHLORIDE: 104 mmol/L (ref 101–111)
CO2: 27 mmol/L (ref 22–32)
CREATININE: 1.04 mg/dL — AB (ref 0.44–1.00)
GFR calc non Af Amer: 54 mL/min — ABNORMAL LOW (ref >60)
Glucose, Bld: 109 mg/dL — ABNORMAL HIGH (ref 65–99)
POTASSIUM: 4.1 mmol/L (ref 3.5–5.1)
Sodium: 137 mmol/L (ref 135–145)

## 2015-12-27 LAB — TROPONIN I

## 2015-12-27 NOTE — Discharge Instructions (Signed)
You have been seen in the Emergency Department (ED) today for chest pain.    Please follow up with the recommended doctor as instructed above in these documents regarding today?s emergent visit and your recent symptoms to discuss further management.  Continue to take your regular medications. If you are not doing so already, please also take a daily baby aspirin (81 mg), at least until you follow up with your doctor.  Return to the Emergency Department (ED) if you experience any further chest pain/pressure/tightness, difficulty breathing, or sudden sweating, or other symptoms that concern you.

## 2015-12-27 NOTE — ED Triage Notes (Signed)
Pt presents to ED with c/o intermittent left sided chest pain for the past several days. Pt seen by her cardiologist friday and was told she needed to have a cardiac coronary CT Angiography on 01/01/16. Nitro taken at home around <1900 and has noticed an improvement in her pain. Pt states her pain only lasts a few seconds but is very sharp in nature. Pt alert and calm with no increased work of breathing noted at this time.

## 2015-12-27 NOTE — ED Provider Notes (Signed)
[facility] Emergency Department Provider Note   ____________________________________________   First MD Initiated Contact with Patient 12/27/15 2040     (approximate)  I have reviewed the triage vital signs and the nursing notes.   HISTORY  Chief Complaint Chest Pain    HPI Patricia Walker is a 70 y.o. female portion and having occasional 1-2 second feelings of pain over the left side of the chest for about one week. Patient reports she has a history of A. fib, and she's had similar discomfort with that in the past. She actually saw her cardiologist this Friday and told him about her chest pain, and they did an echo and told her everything looked okay except to come back on Wednesday of this week for a CT angiogram.  Patient reports that she's had ongoing episodes last 1-2 seconds, sometimes several hours in between of a slight discomfort over the left chest.  No leg pain or swelling. No recent surgeries. No nausea or vomiting. Denies any lasting chest pain or pressure. Currently taking eliquis for AFib/  Past Medical History:  Diagnosis Date  . Atrial fibrillation ([place])   . Coronary artery disease   . Hypertension   . MI, old     There are no active problems to display for this patient.   Past Surgical History:  Procedure Laterality Date  . CARDIAC CATHETERIZATION    . US ECHOCARDIOGRAPHY      Prior to Admission medications   Medication Sig Start Date End Date Taking? Authorizing Provider  amiodarone (PACERONE) 200 MG tablet Take 400 mg by mouth 2 (two) times daily.    Historical Provider, MD  apixaban (ELIQUIS) 5 MG TABS tablet Take 5 mg by mouth 2 (two) times daily.    Historical Provider, MD  cetirizine (ZYRTEC) 10 MG tablet Take 10 mg by mouth daily.    Historical Provider, MD  COSENTYX SENSOREADY 300 DOSE 150 MG/ML SOAJ Inject 300 mg into the skin every 30 (thirty) days.    Historical Provider, MD  cyclobenzaprine (FLEXERIL) 10 MG tablet  Take 1 tablet (10 mg total) by mouth every 8 (eight) hours as needed for muscle spasms. 04/03/15   Pierce Crane Beers, PA-C  esomeprazole (NEXIUM) 40 MG capsule Take 40 mg by mouth daily at 12 noon.    Historical Provider, MD  furosemide (LASIX) 20 MG tablet Take 20 mg by mouth daily as needed for fluid or edema.    Historical Provider, MD  gabapentin (NEURONTIN) 300 MG capsule Take 1 capsule (300 mg total) by mouth at bedtime. 10/23/15   Lisa Roca, MD  HYDROcodone-acetaminophen (NORCO) 5-325 MG tablet Take 1-2 tablets by mouth every 4 (four) hours as needed for moderate pain. Patient not taking: Reported on 10/23/2015 04/03/15   Pierce Crane Beers, PA-C  ibuprofen (ADVIL,MOTRIN) 800 MG tablet Take 1 tablet (800 mg total) by mouth every 8 (eight) hours as needed. Patient not taking: Reported on 10/23/2015 04/03/15   Pierce Crane Beers, PA-C  isosorbide mononitrate (IMDUR) 30 MG 24 hr tablet Take 30 mg by mouth daily.    Historical Provider, MD  levothyroxine (SYNTHROID, LEVOTHROID) 50 MCG tablet Take 50 mcg by mouth daily before breakfast.    Historical Provider, MD  losartan-hydrochlorothiazide (HYZAAR) 50-12.5 MG tablet Take 1 tablet by mouth daily.    Historical Provider, MD  metoprolol succinate (TOPROL XL) 25 MG 24 hr tablet Take 1 tablet (25 mg total) by mouth daily. 04/26/15 04/25/16  Earleen Newport, MD  ondansetron (ZOFRAN) 4 MG tablet Take 4 mg by mouth 2 (two) times daily as needed for nausea or vomiting.    Historical Provider, MD  pregabalin (LYRICA) 50 MG capsule Take 50 mg by mouth 2 (two) times daily.    Historical Provider, MD  ranolazine (RANEXA) 500 MG 12 hr tablet Take 1,000 mg by mouth 2 (two) times daily.    Historical Provider, MD    Allergies Other  Family History  Problem Relation Age of Onset  . Breast cancer Paternal Aunt     Social History Social History  Substance Use Topics  . Smoking status: Former Research scientist (life sciences)  . Smokeless tobacco: Never Used  . Alcohol use No     Review of Systems Constitutional: No fever/chills Eyes: No visual changes. ENT: No sore throat. Cardiovascular: See history of present illness Respiratory: Denies shortness of breath. Gastrointestinal: No abdominal pain.  No nausea, no vomiting.  No diarrhea.  No constipation. Genitourinary: Negative for dysuria. Musculoskeletal: Negative for back pain. Skin: Negative for rash. Neurological: Negative for headaches, focal weakness or numbness.  10-point ROS otherwise negative.  ____________________________________________   PHYSICAL EXAM:  VITAL SIGNS: ED Triage Vitals [12/27/15 1938]  Enc Vitals Group     BP 132/85     Pulse Rate 61     Resp 18     Temp 97.8 F (36.6 C)     Temp Source Oral     SpO2 98 %     Weight (!) 301 lb (136.5 kg)     Height 5\' 4"  (1.626 m)     Head Circumference      Peak Flow      Pain Score 6     Pain Loc      Pain Edu?      Excl. in [place]?     Constitutional: Alert and oriented. Well appearing and in no acute distress. Eyes: Conjunctivae are normal. PERRL. EOMI. Head: Atraumatic. Nose: No congestion/rhinnorhea. Mouth/Throat: Mucous membranes are moist.  Oropharynx non-erythematous. Neck: No stridor.   Cardiovascular: Irregular rhythm, normal rate. Grossly normal heart sounds.  Good peripheral circulation. Respiratory: Normal respiratory effort.  No retractions. Lungs CTAB. Gastrointestinal: Soft and nontender. No distention.  Musculoskeletal: No lower extremity tenderness nor edema.  No joint effusions. Neurologic:  Normal speech and language. No gross focal neurologic deficits are appreciated. No gait instability. Skin:  Skin is warm, dry and intact. No rash noted. Psychiatric: Mood and affect are normal. Speech and behavior are normal.  ____________________________________________   LABS (all labs ordered are listed, but only abnormal results are displayed)  Labs Reviewed  BASIC METABOLIC PANEL - Abnormal; Notable for the  following:       Result Value   Glucose, Bld 109 (*)    BUN 21 (*)    Creatinine, Ser 1.04 (*)    GFR calc non Af Amer 54 (*)    All other components within normal limits  CBC  TROPONIN I   ____________________________________________  EKG  Reviewed injury by me at [place] rate 70 QRS 1:15 QTc 470 Atrial fibrillation, probable left ventricular hypertrophy, compared with previous EKG from January no significant change noted ____________________________________________  RADIOLOGY  Dg Chest 2 View  Result Date: 12/27/2015 CLINICAL DATA:  Intermittent left-sided chest pain for the last few days. EXAM: CHEST  2 VIEW COMPARISON:  04/03/2015 FINDINGS: Atherosclerotic calcification of the aortic arch. Cardiothoracic index 56% on the PA projection. No edema or pleural effusion. The lungs appear clear. IMPRESSION: 1.  Mild enlargement of the cardiopericardial silhouette, without edema. 2. Atherosclerotic aortic arch. Electronically Signed   By: Van Clines M.D.   On: 12/27/2015 20:23    ____________________________________________   PROCEDURES  Procedure(s) performed: None  Procedures  Critical Care performed: No  ____________________________________________   INITIAL IMPRESSION / ASSESSMENT AND PLAN / ED COURSE  Pertinent labs & imaging results that were available during my care of the patient were reviewed by me and considered in my medical decision making (see chart for details).  Fleeting, atypical left-sided chest pain will last 1-2 seconds. Seen and evaluated by her cardiologist on Friday where she reports she told Dr Chancy Milroy. of the same. No nausea or vomiting, no shortness of breath, no signs or symptoms suggest DVT. Patient currently anticoagulated seemingly making a pulmonary wasn't extremely unlikely, in addition the patient's symptoms seems very atypical acute coronary syndrome and she reports she's had the same pain, bowel when she's been in A. fib in the  past.  Troponin normal, with symptoms for about a week and do not believe a second troponin would be helpful at this time, and have discussed the case with the patient. Page Dr. Chancy Milroy her cardiologist, but as of 9:30 he had not returned our page.  ----------------------------------------- 9:34 PM on 12/27/2015 -----------------------------------------  Patient reports she feels well and would like to be discharged. Discussed with the patient that I would like to speak to her cardiologist, Dr. Chancy Milroy first, but she reports that she feels well and will call him tomorrow in the morning for follow-up. Based on the clinical presentation, discussion with the patient I will discharge her and she will follow up closely with Dr. Chancy Milroy. We did discuss careful return precautions for which she is agreeable.  Return precautions and treatment recommendations and follow-up discussed with the patient who is agreeable with the plan.   Clinical Course     ____________________________________________   FINAL CLINICAL IMPRESSION(S) / ED DIAGNOSES  Final diagnoses:  None      NEW MEDICATIONS STARTED DURING THIS VISIT:  New Prescriptions   No medications on file     Note:  This document was prepared using Dragon voice recognition software and Reali include unintentional dictation errors.     Delman Kitten, MD 12/27/15 2137

## 2016-02-01 ENCOUNTER — Encounter: Payer: Self-pay | Admitting: Cardiovascular Disease

## 2016-02-01 ENCOUNTER — Ambulatory Visit
Admission: RE | Admit: 2016-02-01 | Discharge: 2016-02-01 | Disposition: A | Discharge status: Home / Self Care | Payer: Medicare HMO | Source: Ambulatory Visit | Attending: Cardiovascular Disease | Admitting: Cardiovascular Disease

## 2016-02-01 ENCOUNTER — Ambulatory Visit: Payer: Medicare HMO | Admitting: Anesthesiology

## 2016-02-01 ENCOUNTER — Encounter
Admission: RE | Disposition: A | Discharge status: Home / Self Care | Payer: Self-pay | Source: Ambulatory Visit | Attending: Cardiovascular Disease

## 2016-02-01 DIAGNOSIS — J309 Allergic rhinitis, unspecified: Secondary | ICD-10-CM | POA: Diagnosis not present

## 2016-02-01 DIAGNOSIS — K219 Gastro-esophageal reflux disease without esophagitis: Secondary | ICD-10-CM | POA: Insufficient documentation

## 2016-02-01 DIAGNOSIS — E669 Obesity, unspecified: Secondary | ICD-10-CM | POA: Diagnosis not present

## 2016-02-01 DIAGNOSIS — I10 Essential (primary) hypertension: Secondary | ICD-10-CM | POA: Insufficient documentation

## 2016-02-01 DIAGNOSIS — G4730 Sleep apnea, unspecified: Secondary | ICD-10-CM | POA: Insufficient documentation

## 2016-02-01 DIAGNOSIS — Z87891 Personal history of nicotine dependence: Secondary | ICD-10-CM | POA: Diagnosis not present

## 2016-02-01 DIAGNOSIS — Z79899 Other long term (current) drug therapy: Secondary | ICD-10-CM | POA: Insufficient documentation

## 2016-02-01 DIAGNOSIS — I252 Old myocardial infarction: Secondary | ICD-10-CM | POA: Diagnosis not present

## 2016-02-01 DIAGNOSIS — Z7901 Long term (current) use of anticoagulants: Secondary | ICD-10-CM | POA: Insufficient documentation

## 2016-02-01 DIAGNOSIS — I480 Paroxysmal atrial fibrillation: Secondary | ICD-10-CM | POA: Insufficient documentation

## 2016-02-01 DIAGNOSIS — I2510 Atherosclerotic heart disease of native coronary artery without angina pectoris: Secondary | ICD-10-CM | POA: Insufficient documentation

## 2016-02-01 DIAGNOSIS — Z8249 Family history of ischemic heart disease and other diseases of the circulatory system: Secondary | ICD-10-CM | POA: Diagnosis not present

## 2016-02-01 DIAGNOSIS — E039 Hypothyroidism, unspecified: Secondary | ICD-10-CM | POA: Diagnosis not present

## 2016-02-01 DIAGNOSIS — I4891 Unspecified atrial fibrillation: Secondary | ICD-10-CM | POA: Diagnosis present

## 2016-02-01 HISTORY — PX: ELECTROPHYSIOLOGIC STUDY: SHX172A

## 2016-02-01 SURGERY — CARDIOVERSION (CATH LAB)
Anesthesia: General

## 2016-02-01 MED ORDER — PROPOFOL 10 MG/ML IV BOLUS
INTRAVENOUS | Status: DC | PRN
  Administered 2016-02-01: 70 mg via INTRAVENOUS

## 2016-02-01 MED ORDER — SODIUM CHLORIDE 0.9 % IV SOLN: INTRAVENOUS | Status: DC

## 2016-02-01 MED ORDER — SODIUM CHLORIDE 0.9 % IV SOLN
INTRAVENOUS | Status: DC | PRN
  Administered 2016-02-01: 08:00:00 via INTRAVENOUS

## 2016-02-01 NOTE — Procedures (Signed)
  NAME:  Patricia Walker   MRN: [id number] DOB:  07-Aug-1945   ADMIT DATE: 02/01/2016  Procedure: Electrical Cardioversion Indications:Atrial fibrillation  Procedure Details:    Time Out: Verified patient identification, verified procedure, site/side was marked, verified correct patient position, special equipment/implants available, medications/allergies/relevent history reviewed, required imaging and test results available.    Patient placed on cardiac monitor, pulse oximetry, supplemental oxygen as necessary.  Sedation given:  Pacer pads placed   Cardioverted . 200 j Cardioverted at 200j Evaluation: Findings: Post procedure EKG shows: NSR Complications: none Patient did well.     [name], M.D. [facility]   02/01/2016 10:11 AM

## 2016-02-01 NOTE — Anesthesia Postprocedure Evaluation (Signed)
Anesthesia Post Note  Patient: Patricia Walker Peter  Procedure(s) Performed: Procedure(s) (LRB): CARDIOVERSION (N/A)  Patient location during evaluation: Other Anesthesia Type: General Level of consciousness: awake and alert Pain management: pain level controlled Vital Signs Assessment: post-procedure vital signs reviewed and stable Respiratory status: spontaneous breathing, nonlabored ventilation and respiratory function stable Cardiovascular status: blood pressure returned to baseline and stable Postop Assessment: no signs of nausea or vomiting Anesthetic complications: no    Last Vitals:  Vitals:   02/01/16 0808 02/01/16 0822  BP: (!) 143/71 127/69  Pulse: (!) 41 (!) 35  Resp:  (!) 21  Temp:      Last Pain:  Vitals:   02/01/16 0655  TempSrc: Oral                 Katrinna Travieso

## 2016-02-01 NOTE — Discharge Instructions (Signed)
Electrical Cardioversion, Care After °Refer to this sheet in the next few weeks. These instructions provide you with information on caring for yourself after your procedure. Your health care provider [place] also give you more specific instructions. Your treatment has been planned according to current medical practices, but problems sometimes occur. Call your health care provider if you have any problems or questions after your procedure. °WHAT TO EXPECT AFTER THE PROCEDURE °After your procedure, it is typical to have the following sensations: °· Some redness on the skin where the shocks were delivered. If this is tender, a sunburn lotion or hydrocortisone cream Odonohue help. °· Possible return of an abnormal heart rhythm within hours or days after the procedure. °HOME CARE INSTRUCTIONS °· Take medicines only as directed by your health care provider. Be sure you understand how and when to take your medicine. °· Learn how to feel your pulse and check it often. °· Limit your activity for 48 hours after the procedure or as directed by your health care provider. °· Avoid or minimize caffeine and other stimulants as directed by your health care provider. °SEEK MEDICAL CARE IF: °· You feel like your heart is beating too fast or your pulse is not regular. °· You have any questions about your medicines. °· You have bleeding that will not stop. °SEEK IMMEDIATE MEDICAL CARE IF: °· You are dizzy or feel faint. °· It is hard to breathe or you feel short of breath. °· There is a change in discomfort in your chest. °· Your speech is slurred or you have trouble moving an arm or leg on one side of your body. °· You get a serious muscle cramp that does not go away. °· Your fingers or toes turn cold or blue. °  °This information is not intended to replace advice given to you by your health care provider. Make sure you discuss any questions you have with your health care provider. °  °Document Released: 01/16/2013 Document Revised: 04/18/2014  Document Reviewed: 01/16/2013 °Elsevier Interactive Patient Education ©2016 [employer] ° °

## 2016-02-01 NOTE — Anesthesia Preprocedure Evaluation (Addendum)
Anesthesia Evaluation  Patient identified by MRN, date of birth, ID band Patient awake    Reviewed: Allergy & Precautions, NPO status , Patient's Chart, lab work & pertinent test results, reviewed documented beta blocker date and time   History of Anesthesia Complications Negative for: history of anesthetic complications  Airway Mallampati: II  TM Distance: >3 FB Neck ROM: Full    Dental  (+) Poor Dentition, Missing   Pulmonary sleep apnea and Continuous Positive Airway Pressure Ventilation , neg COPD, former smoker,    breath sounds clear to auscultation- rhonchi (-) wheezing      Cardiovascular hypertension, Pt. on home beta blockers and Pt. on medications (-) angina+ CAD and + Past MI  (-) Cardiac Stents and (-) CABG  Rhythm:Irregular Rate:Normal - Systolic murmurs and - Diastolic murmurs Echo 12/25/15: Grade 3 diastolic dysfunction, Normal LF systolic function, mild MR   Neuro/Psych negative neurological ROS  negative psych ROS   GI/Hepatic negative GI ROS, Neg liver ROS,   Endo/Other  neg diabetesHypothyroidism   Renal/GU negative Renal ROS     Musculoskeletal negative musculoskeletal ROS (+)   Abdominal (+) + obese,   Peds  Hematology negative hematology ROS (+)   Anesthesia Other Findings Past Medical History: No date: Atrial fibrillation (HCC) No date: Coronary artery disease No date: Hypertension 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   Dental advisory given  Plan Discussed with: CRNA and Anesthesiologist  Anesthesia Plan Comments:         Anesthesia Quick Evaluation  

## 2016-02-01 NOTE — Transfer of Care (Signed)
Immediate Anesthesia Transfer of Care Note  Patient: Patricia Walker  Procedure(s) Performed: Procedure(s): CARDIOVERSION (N/A)  Patient Location: PACU  Anesthesia Type:General  Level of Consciousness: responds to stimulation  Airway & Oxygen Therapy: Patient Spontanous Breathing and Patient connected to nasal cannula oxygen  Post-op Assessment: Report given to RN and Post -op Vital signs reviewed and stable  Post vital signs: Reviewed and stable  Last Vitals:  Vitals:   02/01/16 0807 02/01/16 0808  BP:  (!) 143/71  Pulse: (!) 39 (!) 41  Resp: 20   Temp:      Last Pain:  Vitals:   02/01/16 0655  TempSrc: Oral         Complications: No apparent anesthesia complications

## 2016-03-06 DIAGNOSIS — E559 Vitamin D deficiency, unspecified: Secondary | ICD-10-CM | POA: Insufficient documentation

## 2016-03-06 DIAGNOSIS — B009 Herpesviral infection, unspecified: Secondary | ICD-10-CM | POA: Insufficient documentation

## 2016-07-19 ENCOUNTER — Other Ambulatory Visit: Payer: Self-pay | Admitting: Internal Medicine

## 2016-07-19 DIAGNOSIS — Z1231 Encounter for screening mammogram for malignant neoplasm of breast: Secondary | ICD-10-CM

## 2016-07-29 ENCOUNTER — Ambulatory Visit
Admission: RE | Admit: 2016-07-29 | Discharge: 2016-07-29 | Disposition: A | Discharge status: Home / Self Care | Payer: Medicare HMO | Source: Ambulatory Visit | Attending: Internal Medicine | Admitting: Internal Medicine

## 2016-07-29 DIAGNOSIS — Z1231 Encounter for screening mammogram for malignant neoplasm of breast: Secondary | ICD-10-CM | POA: Insufficient documentation

## 2016-08-23 ENCOUNTER — Other Ambulatory Visit: Payer: Self-pay | Admitting: Internal Medicine

## 2016-08-23 ENCOUNTER — Ambulatory Visit (HOSPITAL_COMMUNITY)
Admission: RE | Admit: 2016-08-23 | Discharge: 2016-08-23 | Disposition: A | Discharge status: Home / Self Care | Payer: Medicare HMO | Source: Ambulatory Visit | Attending: Internal Medicine | Admitting: Internal Medicine

## 2016-08-23 ENCOUNTER — Ambulatory Visit
Admission: RE | Admit: 2016-08-23 | Discharge: 2016-08-23 | Disposition: A | Discharge status: Home / Self Care | Payer: Medicare HMO | Source: Ambulatory Visit | Attending: Internal Medicine | Admitting: Internal Medicine

## 2016-08-23 ENCOUNTER — Ambulatory Visit: Admission: RE | Admit: 2016-08-23 | Payer: Medicare HMO | Source: Ambulatory Visit

## 2016-08-23 DIAGNOSIS — K449 Diaphragmatic hernia without obstruction or gangrene: Secondary | ICD-10-CM | POA: Diagnosis not present

## 2016-08-23 DIAGNOSIS — K5730 Diverticulosis of large intestine without perforation or abscess without bleeding: Secondary | ICD-10-CM | POA: Diagnosis not present

## 2016-08-23 DIAGNOSIS — R109 Unspecified abdominal pain: Secondary | ICD-10-CM

## 2016-08-23 DIAGNOSIS — I517 Cardiomegaly: Secondary | ICD-10-CM | POA: Insufficient documentation

## 2016-08-23 DIAGNOSIS — I700 Atherosclerosis of aorta: Secondary | ICD-10-CM | POA: Insufficient documentation

## 2016-08-23 DIAGNOSIS — R0781 Pleurodynia: Secondary | ICD-10-CM

## 2016-08-23 DIAGNOSIS — R0602 Shortness of breath: Secondary | ICD-10-CM

## 2016-08-29 ENCOUNTER — Other Ambulatory Visit: Payer: Self-pay | Admitting: Cardiovascular Disease

## 2016-09-01 ENCOUNTER — Ambulatory Visit: Payer: Medicare HMO | Admitting: Anesthesiology

## 2016-09-01 ENCOUNTER — Other Ambulatory Visit: Payer: Self-pay

## 2016-09-01 ENCOUNTER — Encounter
Admission: RE | Disposition: A | Discharge status: Home / Self Care | Payer: Self-pay | Source: Ambulatory Visit | Attending: Cardiovascular Disease

## 2016-09-01 ENCOUNTER — Ambulatory Visit
Admission: RE | Admit: 2016-09-01 | Discharge: 2016-09-01 | Disposition: A | Discharge status: Home / Self Care | Payer: Medicare HMO | Source: Ambulatory Visit | Attending: Cardiovascular Disease | Admitting: Cardiovascular Disease

## 2016-09-01 DIAGNOSIS — I252 Old myocardial infarction: Secondary | ICD-10-CM | POA: Insufficient documentation

## 2016-09-01 DIAGNOSIS — G4730 Sleep apnea, unspecified: Secondary | ICD-10-CM | POA: Diagnosis not present

## 2016-09-01 DIAGNOSIS — I4891 Unspecified atrial fibrillation: Secondary | ICD-10-CM | POA: Diagnosis present

## 2016-09-01 DIAGNOSIS — I10 Essential (primary) hypertension: Secondary | ICD-10-CM | POA: Insufficient documentation

## 2016-09-01 DIAGNOSIS — I2510 Atherosclerotic heart disease of native coronary artery without angina pectoris: Secondary | ICD-10-CM | POA: Insufficient documentation

## 2016-09-01 DIAGNOSIS — E039 Hypothyroidism, unspecified: Secondary | ICD-10-CM | POA: Diagnosis not present

## 2016-09-01 DIAGNOSIS — Z87891 Personal history of nicotine dependence: Secondary | ICD-10-CM | POA: Diagnosis not present

## 2016-09-01 DIAGNOSIS — Z7901 Long term (current) use of anticoagulants: Secondary | ICD-10-CM | POA: Diagnosis not present

## 2016-09-01 DIAGNOSIS — R9439 Abnormal result of other cardiovascular function study: Secondary | ICD-10-CM

## 2016-09-01 DIAGNOSIS — R079 Chest pain, unspecified: Secondary | ICD-10-CM

## 2016-09-01 DIAGNOSIS — Z79899 Other long term (current) drug therapy: Secondary | ICD-10-CM | POA: Diagnosis not present

## 2016-09-01 HISTORY — PX: CARDIOVERSION: EP1203

## 2016-09-01 SURGERY — LEFT HEART CATH
Anesthesia: Moderate Sedation | Laterality: Left

## 2016-09-01 SURGERY — CARDIOVERSION (CATH LAB)
Anesthesia: General | Laterality: Right

## 2016-09-01 SURGERY — LEFT HEART CATH AND CORONARY ANGIOGRAPHY
Anesthesia: Moderate Sedation | Laterality: Right

## 2016-09-01 MED ORDER — SODIUM CHLORIDE 0.9 % IV SOLN
INTRAVENOUS | Status: DC | PRN
  Administered 2016-09-01: 12:00:00 via INTRAVENOUS

## 2016-09-01 MED ORDER — SODIUM CHLORIDE 0.9 % WEIGHT BASED INFUSION
3.0000 mL/kg/h | INTRAVENOUS | Status: DC
Start: 2016-09-02 — End: 2016-09-01
  Administered 2016-09-01: 3 mL/kg/h via INTRAVENOUS

## 2016-09-01 MED ORDER — ASPIRIN 81 MG PO CHEW: 81.0000 mg | CHEWABLE_TABLET | ORAL | Status: DC

## 2016-09-01 MED ORDER — SODIUM CHLORIDE 0.9 % WEIGHT BASED INFUSION: 1.0000 mL/kg/h | INTRAVENOUS | Status: DC

## 2016-09-01 MED ORDER — PROPOFOL 10 MG/ML IV BOLUS
INTRAVENOUS | Status: DC | PRN
  Administered 2016-09-01: 70 mg via INTRAVENOUS

## 2016-09-01 MED ORDER — SODIUM CHLORIDE 0.9 % IV SOLN: 250.0000 mL | INTRAVENOUS | Status: DC | PRN

## 2016-09-01 MED ORDER — SODIUM CHLORIDE 0.9 % WEIGHT BASED INFUSION
1.0000 mL/kg/h | INTRAVENOUS | Status: DC
Start: 2016-09-02 — End: 2016-09-01

## 2016-09-01 MED ORDER — SODIUM CHLORIDE 0.9% FLUSH: 3.0000 mL | INTRAVENOUS | Status: DC | PRN

## 2016-09-01 MED ORDER — SODIUM CHLORIDE 0.9 % WEIGHT BASED INFUSION: 3.0000 mL/kg/h | INTRAVENOUS | Status: DC

## 2016-09-01 MED ORDER — PROPOFOL 10 MG/ML IV BOLUS
INTRAVENOUS | Status: AC
  Filled 2016-09-01: qty 20

## 2016-09-01 MED ORDER — SODIUM CHLORIDE 0.9% FLUSH: 3.0000 mL | Freq: Two times a day (BID) | INTRAVENOUS | Status: DC

## 2016-09-01 MED ORDER — SODIUM CHLORIDE 0.9% FLUSH
3.0000 mL | INTRAVENOUS | Status: DC | PRN
Start: 2016-09-01 — End: 2016-09-01

## 2016-09-01 NOTE — Anesthesia Post-op Follow-up Note (Signed)
Anesthesia QCDR form completed.        

## 2016-09-01 NOTE — Transfer of Care (Signed)
Immediate Anesthesia Transfer of Care Note  Patient: Patricia Walker  Procedure(s) Performed: Procedure(s): Cardioversion (Right)  Patient Location: Cath Lab  Anesthesia Type:General  Level of Consciousness: awake and alert   Airway & Oxygen Therapy: Patient Spontanous Breathing and Patient connected to nasal cannula oxygen  Post-op Assessment: Report given to RN and Post -op Vital signs reviewed and stable  Post vital signs: Reviewed and stable  Last Vitals:  Vitals:   09/01/16 1213 09/01/16 1214  BP:    Pulse: (!) 48 61  Resp: 15 (!) 25  Temp:      Last Pain:  Vitals:   09/01/16 1044  TempSrc: Oral         Complications: No apparent anesthesia complications

## 2016-09-01 NOTE — Anesthesia Preprocedure Evaluation (Signed)
Anesthesia Evaluation  Patient identified by MRN, date of birth, ID band Patient awake    Reviewed: Allergy & Precautions, NPO status , Patient's Chart, lab work & pertinent test results, reviewed documented beta blocker date and time   History of Anesthesia Complications Negative for: history of anesthetic complications  Airway Mallampati: II  TM Distance: >3 FB Neck ROM: Full    Dental  (+) Poor Dentition, Missing   Pulmonary sleep apnea and Continuous Positive Airway Pressure Ventilation , neg COPD, former smoker,    breath sounds clear to auscultation- rhonchi (-) wheezing      Cardiovascular hypertension, Pt. on home beta blockers and Pt. on medications (-) angina+ CAD and + Past MI  (-) Cardiac Stents and (-) CABG  Rhythm:Irregular Rate:Normal - Systolic murmurs and - Diastolic murmurs Echo 2/54/27: Grade 3 diastolic dysfunction, Normal LF systolic function, mild MR   Neuro/Psych negative neurological ROS  negative psych ROS   GI/Hepatic negative GI ROS, Neg liver ROS,   Endo/Other  neg diabetesHypothyroidism   Renal/GU negative Renal ROS     Musculoskeletal negative musculoskeletal ROS (+)   Abdominal (+) + obese,   Peds  Hematology negative hematology ROS (+)   Anesthesia Other Findings Past Medical History: No date: Atrial fibrillation (HCC) No date: Coronary artery disease No date: Hypertension 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   Dental advisory given  Plan Discussed with: CRNA and Anesthesiologist  Anesthesia Plan Comments:         Anesthesia Quick Evaluation

## 2016-09-01 NOTE — Anesthesia Postprocedure Evaluation (Signed)
Anesthesia Post Note  Patient: Eilis Chestnutt Crombie  Procedure(s) Performed: Procedure(s) (LRB): Cardioversion (Right)  Patient location during evaluation: Cath Lab Anesthesia Type: General Level of consciousness: awake and alert and oriented Pain management: pain level controlled Vital Signs Assessment: post-procedure vital signs reviewed and stable Respiratory status: spontaneous breathing, nonlabored ventilation and respiratory function stable Cardiovascular status: blood pressure returned to baseline and stable Postop Assessment: no signs of nausea or vomiting Anesthetic complications: no     Last Vitals:  Vitals:   09/01/16 1213 09/01/16 1214  BP:    Pulse: (!) 48 61  Resp: 15 (!) 25  Temp:      Last Pain:  Vitals:   09/01/16 1044  TempSrc: Oral                 Alaila Pillard

## 2016-09-01 NOTE — Progress Notes (Signed)
MD cancelled cath for today; to see pt. Tomorrow in office. DC instructions, esp. meds reviewed with pt. With verbalized understanding. In no acute distress.

## 2016-09-01 NOTE — Addendum Note (Signed)
Addendum  created 09/01/16 1220 by Rolla Plate, CRNA   Anesthesia Intra Flowsheets edited

## 2016-09-01 NOTE — Progress Notes (Signed)
Pt. Cardioverted by Dr. [name] at 200 joules(sync) with anesthesia/CRNA assist. Pt. Converted to SB with occl PAC'S. Pt. Tolerated well. 12 lead EKG done immediately after.

## 2016-09-02 NOTE — H&P (Signed)
  NAME:  Patricia Walker   MRN: [id number] DOB:  Feb 16, 1946   ADMIT DATE: 09/01/2016  Procedure: Electrical Cardioversion Indications:  Atrial Fibrillation  Procedure Details:    Time Out: Verified patient identification, verified procedure, site/side was marked, verified correct patient position, special equipment/implants available, medications/allergies/relevent history reviewed, required imaging and test results available.    Patient placed on cardiac monitor, pulse oximetry, supplemental oxygen as necessary.  Sedation given:  Pacer pads placed   Cardioverted .to NSR  Cardioverted at 200J  Evaluation: Findings: Post procedure EKG shows: sinus bradycardia Complications: none Patient did well.     [name], M.D. [facility]   09/02/2016 11:29 AM

## 2016-09-07 ENCOUNTER — Other Ambulatory Visit: Payer: Self-pay | Admitting: Cardiovascular Disease

## 2016-09-08 ENCOUNTER — Inpatient Hospital Stay
Admission: AD | Admit: 2016-09-08 | Discharge: 2016-09-11 | DRG: 287 | Disposition: A | Discharge status: Home / Self Care | Payer: Medicare HMO | Source: Ambulatory Visit | Attending: Internal Medicine | Admitting: Internal Medicine

## 2016-09-08 ENCOUNTER — Encounter
Admission: AD | Disposition: A | Discharge status: Home / Self Care | Payer: Self-pay | Source: Ambulatory Visit | Attending: Internal Medicine

## 2016-09-08 ENCOUNTER — Encounter: Payer: Self-pay | Admitting: *Deleted

## 2016-09-08 ENCOUNTER — Inpatient Hospital Stay: Payer: Medicare HMO | Admitting: Registered Nurse

## 2016-09-08 DIAGNOSIS — Z87891 Personal history of nicotine dependence: Secondary | ICD-10-CM | POA: Diagnosis not present

## 2016-09-08 DIAGNOSIS — E039 Hypothyroidism, unspecified: Secondary | ICD-10-CM | POA: Diagnosis present

## 2016-09-08 DIAGNOSIS — I481 Persistent atrial fibrillation: Secondary | ICD-10-CM | POA: Diagnosis present

## 2016-09-08 DIAGNOSIS — I4581 Long QT syndrome: Secondary | ICD-10-CM | POA: Diagnosis present

## 2016-09-08 DIAGNOSIS — I4891 Unspecified atrial fibrillation: Secondary | ICD-10-CM

## 2016-09-08 DIAGNOSIS — I252 Old myocardial infarction: Secondary | ICD-10-CM | POA: Diagnosis not present

## 2016-09-08 DIAGNOSIS — I2510 Atherosclerotic heart disease of native coronary artery without angina pectoris: Secondary | ICD-10-CM | POA: Diagnosis present

## 2016-09-08 DIAGNOSIS — J449 Chronic obstructive pulmonary disease, unspecified: Secondary | ICD-10-CM | POA: Diagnosis present

## 2016-09-08 DIAGNOSIS — Z7901 Long term (current) use of anticoagulants: Secondary | ICD-10-CM | POA: Diagnosis not present

## 2016-09-08 DIAGNOSIS — Z6841 Body Mass Index (BMI) 40.0 and over, adult: Secondary | ICD-10-CM | POA: Diagnosis not present

## 2016-09-08 DIAGNOSIS — I10 Essential (primary) hypertension: Secondary | ICD-10-CM | POA: Diagnosis present

## 2016-09-08 DIAGNOSIS — G4733 Obstructive sleep apnea (adult) (pediatric): Secondary | ICD-10-CM | POA: Diagnosis present

## 2016-09-08 HISTORY — DX: Psoriasis, unspecified: L40.9

## 2016-09-08 HISTORY — PX: LEFT HEART CATH AND CORONARY ANGIOGRAPHY: CATH118249

## 2016-09-08 LAB — CBC
HCT: 43.5 % (ref 35.0–47.0)
Hemoglobin: 14.4 g/dL (ref 12.0–16.0)
MCH: 27.6 pg (ref 26.0–34.0)
MCHC: 33.1 g/dL (ref 32.0–36.0)
MCV: 83.6 fL (ref 80.0–100.0)
PLATELETS: 288 10*3/uL (ref 150–440)
RBC: 5.2 MIL/uL (ref 3.80–5.20)
RDW: 14.3 % (ref 11.5–14.5)
WBC: 5.6 10*3/uL (ref 3.6–11.0)

## 2016-09-08 LAB — MAGNESIUM: Magnesium: 2.2 mg/dL (ref 1.7–2.4)

## 2016-09-08 LAB — BASIC METABOLIC PANEL
Anion gap: 7 (ref 5–15)
BUN: 20 mg/dL (ref 6–20)
CALCIUM: 8.6 mg/dL — AB (ref 8.9–10.3)
CHLORIDE: 101 mmol/L (ref 101–111)
CO2: 28 mmol/L (ref 22–32)
CREATININE: 0.79 mg/dL (ref 0.44–1.00)
GFR calc non Af Amer: >60 mL/min (ref >60)
Glucose, Bld: 90 mg/dL (ref 65–99)
Potassium: 4.2 mmol/L (ref 3.5–5.1)
Sodium: 136 mmol/L (ref 135–145)

## 2016-09-08 LAB — PROTIME-INR
INR: 0.97
PROTHROMBIN TIME: 12.9 s (ref 11.4–15.2)

## 2016-09-08 SURGERY — LEFT HEART CATH AND CORONARY ANGIOGRAPHY
Anesthesia: Moderate Sedation | Laterality: Right

## 2016-09-08 SURGERY — LEFT HEART CATH AND CORONARY ANGIOGRAPHY
Anesthesia: Moderate Sedation

## 2016-09-08 MED ORDER — APIXABAN 5 MG PO TABS: 5.0000 mg | ORAL_TABLET | Freq: Two times a day (BID) | ORAL | Status: DC

## 2016-09-08 MED ORDER — LIDOCAINE HCL 2 % EX GEL
CUTANEOUS | Status: AC
  Filled 2016-09-08: qty 5

## 2016-09-08 MED ORDER — SENNOSIDES-DOCUSATE SODIUM 8.6-50 MG PO TABS: 1.0000 | ORAL_TABLET | Freq: Every evening | ORAL | Status: DC | PRN

## 2016-09-08 MED ORDER — SODIUM CHLORIDE 0.9 % WEIGHT BASED INFUSION
1.0000 mL/kg/h | INTRAVENOUS | Status: AC
  Administered 2016-09-08: 1 mL/kg/h via INTRAVENOUS

## 2016-09-08 MED ORDER — IOPAMIDOL (ISOVUE-300) INJECTION 61%
INTRAVENOUS | Status: DC | PRN
  Administered 2016-09-08: 90 mL via INTRA_ARTERIAL

## 2016-09-08 MED ORDER — SODIUM CHLORIDE 0.9% FLUSH: 3.0000 mL | Freq: Two times a day (BID) | INTRAVENOUS | Status: DC

## 2016-09-08 MED ORDER — LORATADINE 10 MG PO TABS
10.0000 mg | ORAL_TABLET | Freq: Every day | ORAL | Status: DC
  Administered 2016-09-08: 10 mg via ORAL
  Filled 2016-09-08: qty 1

## 2016-09-08 MED ORDER — SODIUM CHLORIDE 0.9 % IV SOLN: 250.0000 mL | INTRAVENOUS | Status: DC | PRN

## 2016-09-08 MED ORDER — ONDANSETRON HCL 4 MG/2ML IJ SOLN: 4.0000 mg | Freq: Four times a day (QID) | INTRAMUSCULAR | Status: DC | PRN

## 2016-09-08 MED ORDER — ACETAMINOPHEN 325 MG PO TABS: 650.0000 mg | ORAL_TABLET | ORAL | Status: DC | PRN

## 2016-09-08 MED ORDER — VITAMIN D3 1.25 MG (50000 UT) PO CAPS: 50000.0000 [IU] | ORAL_CAPSULE | ORAL | Status: DC

## 2016-09-08 MED ORDER — ACETAMINOPHEN 325 MG PO TABS: 650.0000 mg | ORAL_TABLET | Freq: Two times a day (BID) | ORAL | Status: DC | PRN

## 2016-09-08 MED ORDER — MAGNESIUM SULFATE IN D5W 1-5 GM/100ML-% IV SOLN
1.0000 g | Freq: Once | INTRAVENOUS | Status: AC
  Administered 2016-09-08: 1 g via INTRAVENOUS
  Filled 2016-09-08: qty 100

## 2016-09-08 MED ORDER — PANTOPRAZOLE SODIUM 40 MG PO TBEC
40.0000 mg | DELAYED_RELEASE_TABLET | Freq: Every day | ORAL | Status: DC
  Administered 2016-09-08 – 2016-09-11 (×4): 40 mg via ORAL
  Filled 2016-09-08 (×4): qty 1

## 2016-09-08 MED ORDER — ACETAMINOPHEN 325 MG PO TABS
650.0000 mg | ORAL_TABLET | Freq: Four times a day (QID) | ORAL | Status: DC | PRN
  Administered 2016-09-10: 650 mg via ORAL
  Filled 2016-09-08: qty 2

## 2016-09-08 MED ORDER — CALCIUM CARBONATE ANTACID 500 MG PO CHEW
500.0000 mg | CHEWABLE_TABLET | Freq: Every day | ORAL | Status: DC | PRN
  Administered 2016-09-08: 500 mg via ORAL
  Filled 2016-09-08: qty 1

## 2016-09-08 MED ORDER — ACETAMINOPHEN 650 MG RE SUPP
650.0000 mg | Freq: Four times a day (QID) | RECTAL | Status: DC | PRN
  Filled 2016-09-08: qty 1

## 2016-09-08 MED ORDER — ONDANSETRON HCL 4 MG PO TABS
4.0000 mg | ORAL_TABLET | Freq: Four times a day (QID) | ORAL | Status: DC | PRN
  Filled 2016-09-08: qty 1

## 2016-09-08 MED ORDER — ATORVASTATIN CALCIUM 20 MG PO TABS
20.0000 mg | ORAL_TABLET | Freq: Every evening | ORAL | Status: DC
  Administered 2016-09-08 – 2016-09-10 (×3): 20 mg via ORAL
  Filled 2016-09-08 (×3): qty 1

## 2016-09-08 MED ORDER — ENOXAPARIN SODIUM 150 MG/ML ~~LOC~~ SOLN
1.0000 mg/kg | Freq: Two times a day (BID) | SUBCUTANEOUS | Status: DC
  Administered 2016-09-08 – 2016-09-10 (×5): 135 mg via SUBCUTANEOUS
  Filled 2016-09-08 (×6): qty 0.89

## 2016-09-08 MED ORDER — SODIUM CHLORIDE 0.9 % IV SOLN
250.0000 mL | INTRAVENOUS | Status: DC | PRN
  Administered 2016-09-09: 08:00:00 via INTRAVENOUS

## 2016-09-08 MED ORDER — FENTANYL CITRATE (PF) 100 MCG/2ML IJ SOLN
INTRAMUSCULAR | Status: AC
  Filled 2016-09-08: qty 2

## 2016-09-08 MED ORDER — MIDAZOLAM HCL 2 MG/2ML IJ SOLN
INTRAMUSCULAR | Status: AC
  Filled 2016-09-08: qty 2

## 2016-09-08 MED ORDER — SODIUM CHLORIDE 0.9% FLUSH: 3.0000 mL | INTRAVENOUS | Status: DC | PRN

## 2016-09-08 MED ORDER — SODIUM CHLORIDE 0.9% FLUSH
3.0000 mL | Freq: Two times a day (BID) | INTRAVENOUS | Status: DC
  Administered 2016-09-08 – 2016-09-10 (×5): 3 mL via INTRAVENOUS

## 2016-09-08 MED ORDER — MIDAZOLAM HCL 2 MG/2ML IJ SOLN
INTRAMUSCULAR | Status: DC | PRN
  Administered 2016-09-08 (×3): 0.5 mg via INTRAVENOUS

## 2016-09-08 MED ORDER — BISACODYL 5 MG PO TBEC: 5.0000 mg | DELAYED_RELEASE_TABLET | Freq: Every day | ORAL | Status: DC | PRN

## 2016-09-08 MED ORDER — SODIUM CHLORIDE 0.9 % WEIGHT BASED INFUSION
3.0000 mL/kg/h | INTRAVENOUS | Status: DC
  Administered 2016-09-08: 3 mL/kg/h via INTRAVENOUS

## 2016-09-08 MED ORDER — MOMETASONE FURO-FORMOTEROL FUM 200-5 MCG/ACT IN AERO
2.0000 | INHALATION_SPRAY | Freq: Two times a day (BID) | RESPIRATORY_TRACT | Status: DC
  Administered 2016-09-08 – 2016-09-11 (×5): 2 via RESPIRATORY_TRACT
  Filled 2016-09-08: qty 8.8

## 2016-09-08 MED ORDER — TRAMADOL HCL 50 MG PO TABS
50.0000 mg | ORAL_TABLET | Freq: Four times a day (QID) | ORAL | Status: DC | PRN
  Administered 2016-09-08: 50 mg via ORAL
  Filled 2016-09-08: qty 1

## 2016-09-08 MED ORDER — FENTANYL CITRATE (PF) 100 MCG/2ML IJ SOLN
INTRAMUSCULAR | Status: DC | PRN
  Administered 2016-09-08 (×3): 25 ug via INTRAVENOUS

## 2016-09-08 MED ORDER — HEPARIN (PORCINE) IN NACL 2-0.9 UNIT/ML-% IJ SOLN
INTRAMUSCULAR | Status: AC
  Filled 2016-09-08: qty 500

## 2016-09-08 MED ORDER — DEXAMETHASONE SODIUM PHOSPHATE 10 MG/ML IJ SOLN
INTRAMUSCULAR | Status: AC
  Filled 2016-09-08: qty 1

## 2016-09-08 MED ORDER — ONDANSETRON HCL 4 MG/2ML IJ SOLN
INTRAMUSCULAR | Status: AC
  Filled 2016-09-08: qty 2

## 2016-09-08 MED ORDER — SODIUM CHLORIDE 0.9 % WEIGHT BASED INFUSION: 1.0000 mL/kg/h | INTRAVENOUS | Status: DC

## 2016-09-08 MED ORDER — SODIUM CHLORIDE 0.9 % IV SOLN: 250.0000 mL | INTRAVENOUS | Status: DC

## 2016-09-08 MED ORDER — PROPOFOL 10 MG/ML IV BOLUS
INTRAVENOUS | Status: AC
  Filled 2016-09-08: qty 20

## 2016-09-08 MED ORDER — LEVOTHYROXINE SODIUM 50 MCG PO TABS
50.0000 ug | ORAL_TABLET | Freq: Every day | ORAL | Status: DC
  Administered 2016-09-10: 50 ug via ORAL
  Filled 2016-09-08: qty 1

## 2016-09-08 MED ORDER — FUROSEMIDE 20 MG PO TABS: 20.0000 mg | ORAL_TABLET | Freq: Every day | ORAL | Status: DC | PRN

## 2016-09-08 SURGICAL SUPPLY — 9 items
CATH INFINITI 5FR ANG PIGTAIL (CATHETERS) ×3 IMPLANT
CATH INFINITI 5FR JL4 (CATHETERS) ×3 IMPLANT
CATH INFINITI JR4 5F (CATHETERS) ×3 IMPLANT
DEVICE CLOSURE MYNXGRIP 5F (Vascular Products) ×3 IMPLANT
KIT MANI 3VAL PERCEP (MISCELLANEOUS) ×3 IMPLANT
NEEDLE PERC 18GX7CM (NEEDLE) ×3 IMPLANT
PACK CARDIAC CATH (CUSTOM PROCEDURE TRAY) ×3 IMPLANT
SHEATH PINNACLE 5F 10CM (SHEATH) ×3 IMPLANT
WIRE EMERALD 3MM-J .035X150CM (WIRE) ×3 IMPLANT

## 2016-09-08 NOTE — H&P (Signed)
[place] [facility] [place] NAME: Patricia Walker    MR#:  [id number]  DATE OF BIRTH:  [birth year]  DATE OF ADMISSION:  09/08/2016  PRIMARY CARE PHYSICIAN: Perrin Maltese, MD   REQUESTING/REFERRING PHYSICIAN: dr [name] CHIEF COMPLAINT:   Atrial Fib HISTORY OF PRESENT ILLNESS:  Patricia Walker  is a 71 y.o. female with a known history of Atrial fibrillation and CAD who is being admitted for persistent atrial fibrillation. Over the past month patient has had sotalol and amiodarone as well as cardioversion but continues to be in symptomatic atrial fibrillation shortness of breath and fatigue. Patient had cardiac catheterization today which revealed normal coronary arteries. Her cardiologist Dr.Khan has asked hospitalist service to admit the patient so that the patient Patricia Walker try Dofetilide.   PAST MEDICAL HISTORY:   Past Medical History:  Diagnosis Date  . Atrial fibrillation ([place])   . Coronary artery disease   . Hypertension   . MI, old   . Psoriasis     PAST SURGICAL HISTORY:   Past Surgical History:  Procedure Laterality Date  . CARDIAC CATHETERIZATION    . CARDIOVERSION Right 09/01/2016   Procedure: Cardioversion;  Surgeon: [name], MD;  Location: [facility] ORS;  Service: Cardiovascular;  Laterality: Right;  . ELECTROPHYSIOLOGIC STUDY N/A 02/01/2016   Procedure: CARDIOVERSION;  Surgeon: [name], MD;  Location: [facility] ORS;  Service: Cardiovascular;  Laterality: N/A;  . ESOPHAGEAL DILATION    . US ECHOCARDIOGRAPHY      SOCIAL HISTORY:   Social History  Substance Use Topics  . Smoking status: Former Smoker    Types: Cigarettes    Quit date: 02/01/2013  . Smokeless tobacco: Never Used  . Alcohol use No    FAMILY HISTORY:   Family History  Problem Relation Age of Onset  . Breast cancer Paternal Aunt   . Other Father     DRUG ALLERGIES:   Allergies  Allergen Reactions  . Other Itching    States antibiotic in the past caused  itching but can not remember name    REVIEW OF SYSTEMS:   Review of Systems  Constitutional: Negative.  Negative for chills, fever and malaise/fatigue.  HENT: Negative.  Negative for ear discharge, ear pain, hearing loss, nosebleeds and sore throat.   Eyes: Negative.  Negative for blurred vision and pain.  Respiratory: Negative.  Negative for cough, hemoptysis, shortness of breath and wheezing.   Cardiovascular: Negative.  Negative for chest pain, palpitations and leg swelling.  Gastrointestinal: Negative.  Negative for abdominal pain, blood in stool, diarrhea, nausea and vomiting.  Genitourinary: Negative.  Negative for dysuria.  Musculoskeletal: Negative.  Negative for back pain.  Skin: Negative.   Neurological: Negative for dizziness, tremors, speech change, focal weakness, seizures and headaches.  Endo/Heme/Allergies: Negative.  Does not bruise/bleed easily.  Psychiatric/Behavioral: Negative.  Negative for depression, hallucinations and suicidal ideas.    MEDICATIONS AT HOME:   Prior to Admission medications   Medication Sig Start Date End Date Taking? Authorizing Provider  acetaminophen (TYLENOL) 325 MG tablet Take 650 mg by mouth 2 (two) times daily as needed for mild pain or headache.   Yes [provider]  atorvastatin (LIPITOR) 20 MG tablet Take 20 mg by mouth every evening. 08/25/16  Yes [provider]  budesonide-formoterol (SYMBICORT) 160-4.5 MCG/ACT inhaler Inhale 2 puffs into the lungs 2 (two) times daily.   Yes [provider]  calcium carbonate (TUMS - DOSED IN MG ELEMENTAL CALCIUM)  500 MG chewable tablet Chew 500 mg by mouth daily as needed for indigestion or heartburn.    Yes [provider]  cetirizine (ZYRTEC) 10 MG tablet Take 10 mg by mouth daily.   Yes [provider]  Cholecalciferol (VITAMIN D3) 50000 units CAPS Take 50,000 Units by mouth once a week. Monday  08/24/16  Yes [provider]  cyclobenzaprine  (FLEXERIL) 10 MG tablet Take 1 tablet (10 mg total) by mouth every 8 (eight) hours as needed for muscle spasms. Patient taking differently: Take 10 mg by mouth 3 (three) times daily as needed for muscle spasms.  04/03/15  Yes Beers, Pierce Crane, PA-C  Diclofenac Sodium (PENNSAID) 2 % SOLN Place onto the skin.   Yes [provider]  esomeprazole (NEXIUM) 40 MG capsule Take 40 mg by mouth daily.    Yes [provider]  furosemide (LASIX) 20 MG tablet Take 20 mg by mouth daily as needed for fluid. 08/24/16  Yes [provider]  Guselkumab (TREMFYA ) Inject 1 Dose into the skin every 8 (eight) weeks.   Yes [provider]  levothyroxine (SYNTHROID, LEVOTHROID) 50 MCG tablet Take 50 mcg by mouth daily before breakfast.   Yes [provider]  neomycin-polymyxin-hydrocortisone (CORTISPORIN) otic solution Place 4 drops into the right ear 4 (four) times daily as needed (psoriasis in ear).   Yes [provider]  nystatin (NYSTATIN) powder Apply topically 4 (four) times daily as needed. Irritation under breast and groin area   Yes [provider]  sotalol (BETAPACE) 80 MG tablet Take 80 mg by mouth daily.   Yes [provider]  TREMFYA 100 MG/ML SOSY Inject 100 mg into the skin every 28 (twenty-eight) days. 09/02/16  Yes [provider]  apixaban (ELIQUIS) 5 MG TABS tablet Take 5 mg by mouth 2 (two) times daily.    [provider]      VITAL SIGNS:  Blood pressure 128/70, pulse (!) 55, temperature 98.5 F (36.9 C), temperature source Oral, resp. rate 16, height 5\' 4"  (1.626 m), weight 133.8 kg (295 lb), SpO2 97 %.  PHYSICAL EXAMINATION:   Physical Exam  Constitutional: She is oriented to person, place, and time and well-developed, well-nourished, and in no distress. No distress.  HENT:  Head: Normocephalic.  Eyes: No scleral icterus.  Neck: Normal range of motion. Neck supple. No JVD present. No tracheal deviation  present.  Cardiovascular: Normal rate and normal heart sounds.  Exam reveals no gallop and no friction rub.   No murmur heard. Irr, irr  Pulmonary/Chest: Effort normal and breath sounds normal. No respiratory distress. She has no wheezes. She has no rales. She exhibits no tenderness.  Abdominal: Soft. Bowel sounds are normal. She exhibits no distension and no mass. There is no tenderness. There is no rebound and no guarding.  Musculoskeletal: Normal range of motion. She exhibits no edema.  Neurological: She is alert and oriented to person, place, and time.  Skin: Skin is warm. No rash noted. No erythema.  Psychiatric: Affect and judgment normal.      LABORATORY PANEL:   CBC No results for input(s): WBC, HGB, HCT, PLT in the last 168 hours. ------------------------------------------------------------------------------------------------------------------  Chemistries  No results for input(s): NA, K, CL, CO2, GLUCOSE, BUN, CREATININE, CALCIUM, MG, AST, ALT, ALKPHOS, BILITOT in the last 168 hours.  Invalid input(s): GFRCGP ------------------------------------------------------------------------------------------------------------------  Cardiac Enzymes No results for input(s): TROPONINI in the last 168 hours. ------------------------------------------------------------------------------------------------------------------  RADIOLOGY:  No results found.  EKG:  Orders placed or performed during the hospital encounter of 02/01/16  . EKG 12-Lead  . EKG 12-Lead  . EKG 12-Lead pre-cardioversion  . EKG 12-Lead  . EKG 12-Lead pre-cardioversion  . EKG 12-Lead  . EKG    IMPRESSION AND PLAN:    71 year old female with symptomatic atrial fibrillation who was tried sotalol, amiodarone and cardioversion but still remains in symptomatic atrial fibrillation.  1. Symptomatic atrial fibrillation: Dr [name] will order Dofetilide which is anIKr blocker but without other clinically  significant electrophysiological effects. It is renally cleared and must be dosed according to creatinine clearance It was approved for use in the [place] in 2000 with a 3-day mandatory in-hospital loading period. Continue Eliquis  2. CAD: Continue atorvastatin   3. Hypothyroid continue Synthroid    All the records are reviewed and case discussed with Dr [name]  Management plans discussed with the patient and she is in agreement  CODE STATUS: full  TOTAL TIME TAKING CARE OF THIS PATIENT: 48 minutes.    Patricia Walker M.D on 09/08/2016 at 1:20 PM  Between 7am to 6pm - Pager - [phone]  After 6pm go to [email or web address] - password [email or web address] [facility]  Office  [phone]  CC: Primary care physician; Perrin Maltese, MD

## 2016-09-08 NOTE — Consult Note (Signed)
Patricia Walker is a 71 y.o. female  [id number]  Primary [place] Reason for Consultation: atrial fibrillation.  HPI: 32 YOWF with h/o atrial fibrillation, was not tolerating sotolol 80 bid and switched to amiodrone last week after successful cardioversion to NSR, but went back into afib after having stopped amiodrone due to headaches and dizziness. Cardiacc cath today shows mild CAD with 35% mid LAD and mid RIM and normal LVEF.    Review of Systems: NO chest pain   Past Medical History:  Diagnosis Date  . Atrial fibrillation ([place])   . Coronary artery disease   . Hypertension   . MI, old   . Psoriasis     Medications Prior to Admission  Medication Sig Dispense Refill  . acetaminophen (TYLENOL) 325 MG tablet Take 650 mg by mouth 2 (two) times daily as needed for mild pain or headache.    [name] Kitchen atorvastatin (LIPITOR) 20 MG tablet Take 20 mg by mouth every evening.  1  . budesonide-formoterol (SYMBICORT) 160-4.5 MCG/ACT inhaler Inhale 2 puffs into the lungs 2 (two) times daily.    . calcium carbonate (TUMS - DOSED IN MG ELEMENTAL CALCIUM) 500 MG chewable tablet Chew 500 mg by mouth daily as needed for indigestion or heartburn.     . cetirizine (ZYRTEC) 10 MG tablet Take 10 mg by mouth daily.    . Cholecalciferol (VITAMIN D3) 50000 units CAPS Take 50,000 Units by mouth once a week. Monday   1  . cyclobenzaprine (FLEXERIL) 10 MG tablet Take 1 tablet (10 mg total) by mouth every 8 (eight) hours as needed for muscle spasms. (Patient taking differently: Take 10 mg by mouth 3 (three) times daily as needed for muscle spasms. ) 30 tablet 1  . Diclofenac Sodium (PENNSAID) 2 % SOLN Place onto the skin.    [name] Kitchen esomeprazole (NEXIUM) 40 MG capsule Take 40 mg by mouth daily.     . furosemide (LASIX) 20 MG tablet Take 20 mg by mouth daily as needed for fluid.  2  . Guselkumab (TREMFYA [place]) Inject 1 Dose into the skin every 8 (eight) weeks.    [name] Kitchen levothyroxine (SYNTHROID, LEVOTHROID) 50 MCG  tablet Take 50 mcg by mouth daily before breakfast.    . neomycin-polymyxin-hydrocortisone (CORTISPORIN) otic solution Place 4 drops into the right ear 4 (four) times daily as needed (psoriasis in ear).    . nystatin (NYSTATIN) powder Apply topically 4 (four) times daily as needed. Irritation under breast and groin area    . sotalol (BETAPACE) 80 MG tablet Take 80 mg by mouth daily.    [name] Kitchen TREMFYA 100 MG/ML SOSY Inject 100 mg into the skin every 28 (twenty-eight) days.    [name] Kitchen apixaban (ELIQUIS) 5 MG TABS tablet Take 5 mg by mouth 2 (two) times daily.       [name] Kitchen apixaban  5 mg Oral BID  . atorvastatin  20 mg Oral QPM  . [START ON 09/09/2016] levothyroxine  50 mcg Oral QAC breakfast  . loratadine  10 mg Oral Daily  . mometasone-formoterol  2 puff Inhalation BID  . pantoprazole  40 mg Oral Daily  . sodium chloride flush  3 mL Intravenous Q12H  . Vitamin D3  50,000 Units Oral Weekly    Infusions: . sodium chloride    . [START ON 09/09/2016] sodium chloride 3 mL/kg/hr (09/08/16 1136)   Followed by  . [START ON 09/09/2016] sodium chloride      Allergies  Allergen Reactions  . Other Itching  States antibiotic in the past caused itching but can not remember name    Social History   Social History  . Marital status: Divorced    Spouse name: N/A  . Number of children: N/A  . Years of education: N/A   Occupational History  . Not on file.   Social History Main Topics  . Smoking status: Former Smoker    Types: Cigarettes    Quit date: 02/01/2013  . Smokeless tobacco: Never Used  . Alcohol use No  . Drug use: No  . Sexual activity: Not on file   Other Topics Concern  . Not on file   Social History Narrative  . No narrative on file    Family History  Problem Relation Age of Onset  . Breast cancer Paternal Aunt   . Other Father     PHYSICAL EXAM: Vitals:   09/08/16 1101 09/08/16 1329  BP: 128/70 (!) 140/98  Pulse: (!) 55   Resp: 16   Temp: 98.5 F (36.9 C)     No  intake or output data in the 24 hours ending 09/08/16 1330  General:  Well appearing. No respiratory difficulty HEENT: normal Neck: supple. no JVD. Carotids 2+ bilat; no bruits. No lymphadenopathy or thryomegaly appreciated. Cor: PMI nondisplaced. Regular rate & rhythm. No rubs, gallops or murmurs. Lungs: clear Abdomen: soft, nontender, nondistended. No hepatosplenomegaly. No bruits or masses. Good bowel sounds. Extremities: no cyanosis, clubbing, rash, edema Neuro: alert & oriented x 3, cranial nerves grossly intact. moves all 4 extremities w/o difficulty. Affect pleasant.  ECG: afib with slow VR No results found for this or any previous visit (from the past 24 hour(s)). No results found.   ASSESSMENT AND PLAN: Atrial fib with slow VR, and unable to tolerate amiodrone and sotolol as leads to pauses and slow VR. Will start dofetilide(Tykosin), protocol. IF does not convert tomorrow will do DCCV again.  KHAN,SHAUKAT A

## 2016-09-08 NOTE — Progress Notes (Signed)
ANTICOAGULATION CONSULT NOTE - Initial Consult  Pharmacy Consult for Lovenox  Indication: atrial fibrillation  Allergies  Allergen Reactions  . Other Itching    States antibiotic in the past caused itching but can not remember name    Patient Measurements: Height: 5\' 4"  (162.6 cm) Weight: 295 lb (133.8 kg) IBW/kg (Calculated) : 54.7 Heparin Dosing Weight:   Vital Signs: Temp: 97.5 F (36.4 C) (05/31 1616) Temp Source: Oral (05/31 1616) BP: 117/60 (05/31 1616) Pulse Rate: 76 (05/31 1616)  Labs:  Recent Labs  09/08/16 1444  HGB 14.4  HCT 43.5  PLT 288  LABPROT 12.9  INR 0.97  CREATININE 0.79    Estimated Creatinine Clearance: 89.1 mL/min (by C-G formula based on SCr of 0.79 mg/dL).   Medical History: Past Medical History:  Diagnosis Date  . Atrial fibrillation ([place])   . Coronary artery disease   . Hypertension   . MI, old   . Psoriasis     Medications:  Prescriptions Prior to Admission  Medication Sig Dispense Refill Last Dose  . acetaminophen (TYLENOL) 325 MG tablet Take 650 mg by mouth 2 (two) times daily as needed for mild pain or headache.   09/05/2016  . atorvastatin (LIPITOR) 20 MG tablet Take 20 mg by mouth every evening.  1 09/07/2016 at Unknown time  . budesonide-formoterol (SYMBICORT) 160-4.5 MCG/ACT inhaler Inhale 2 puffs into the lungs 2 (two) times daily.   Past Week at Unknown time  . calcium carbonate (TUMS - DOSED IN MG ELEMENTAL CALCIUM) 500 MG chewable tablet Chew 500 mg by mouth daily as needed for indigestion or heartburn.    09/07/2016 at Unknown time  . cetirizine (ZYRTEC) 10 MG tablet Take 10 mg by mouth daily.   09/07/2016 at Unknown time  . Cholecalciferol (VITAMIN D3) 50000 units CAPS Take 50,000 Units by mouth once a week. Monday   1   . cyclobenzaprine (FLEXERIL) 10 MG tablet Take 1 tablet (10 mg total) by mouth every 8 (eight) hours as needed for muscle spasms. (Patient taking differently: Take 10 mg by mouth 3 (three) times daily as  needed for muscle spasms. ) 30 tablet 1 Past Month at Unknown time  . Diclofenac Sodium (PENNSAID) 2 % SOLN Place onto the skin.     [name] Kitchen esomeprazole (NEXIUM) 40 MG capsule Take 40 mg by mouth daily.    09/07/2016 at Unknown time  . furosemide (LASIX) 20 MG tablet Take 20 mg by mouth daily as needed for fluid.  2   . Guselkumab (TREMFYA ) Inject 1 Dose into the skin every 8 (eight) weeks.   08/04/2016  . levothyroxine (SYNTHROID, LEVOTHROID) 50 MCG tablet Take 50 mcg by mouth daily before breakfast.   09/07/2016 at Unknown time  . neomycin-polymyxin-hydrocortisone (CORTISPORIN) otic solution Place 4 drops into the right ear 4 (four) times daily as needed (psoriasis in ear).   Past Month at Unknown time  . nystatin (NYSTATIN) powder Apply topically 4 (four) times daily as needed. Irritation under breast and groin area   Past Month at Unknown time  . sotalol (BETAPACE) 80 MG tablet Take 80 mg by mouth daily.   09/07/2016 at Unknown time  . TREMFYA 100 MG/ML SOSY Inject 100 mg into the skin every 28 (twenty-eight) days.   Past Month at Unknown time  . apixaban (ELIQUIS) 5 MG TABS tablet Take 5 mg by mouth 2 (two) times daily.   09/06/2016   Scheduled:  . atorvastatin  20 mg Oral QPM  .  enoxaparin (LOVENOX) injection  1 mg/kg Subcutaneous Q12H  . [START ON 09/09/2016] levothyroxine  50 mcg Oral QAC breakfast  . loratadine  10 mg Oral Daily  . mometasone-formoterol  2 puff Inhalation BID  . pantoprazole  40 mg Oral Daily  . sodium chloride flush  3 mL Intravenous Q12H  . [START ON 09/12/2016] Vitamin D3  50,000 Units Oral Weekly    Assessment: Pharmacy consulted to start Enoxaparin 8 hours post sheath removal. Spoke with MD [name] and MD states that sheath was removed @ 13:30 Goal of Therapy:      Plan:  Will start Enoxaparin 1 mg/kg SQ q12 hours @ 21:30 (8 hours post removal)   Duane Earnshaw D 09/08/2016,5:47 PM

## 2016-09-08 NOTE — Progress Notes (Signed)
MEDICATION RELATED CONSULT NOTE - INITIAL   Pharmacy Consult for dofetilide Indication: atrial fibrillation  Allergies  Allergen Reactions  . Other Itching    States antibiotic in the past caused itching but can not remember name    Patient Measurements: Height: 5\' 4"  (162.6 cm) Weight: 295 lb (133.8 kg) IBW/kg (Calculated) : 54.7 Adjusted Body Weight:   Vital Signs: Temp: 97.5 F (36.4 C) (05/31 1616) Temp Source: Oral (05/31 1616) BP: 117/60 (05/31 1616) Pulse Rate: 76 (05/31 1616) Intake/Output from previous day: No intake/output data recorded. Intake/Output from this shift: Total I/O In: 240 [P.O.:240] Out: 200 [Urine:200]  Labs:  Recent Labs  09/08/16 1444  WBC 5.6  HGB 14.4  HCT 43.5  PLT 288  CREATININE 0.79  MG 2.2   Estimated Creatinine Clearance: 89.1 mL/min (by C-G formula based on SCr of 0.79 mg/dL).   Microbiology: No results found for this or any previous visit (from the past 720 hour(s)).  Medical History: Past Medical History:  Diagnosis Date  . Atrial fibrillation ([name])   . Coronary artery disease   . Hypertension   . MI, old   . Psoriasis     Medications:  Prescriptions Prior to Admission  Medication Sig Dispense Refill Last Dose  . acetaminophen (TYLENOL) 325 MG tablet Take 650 mg by mouth 2 (two) times daily as needed for mild pain or headache.   09/05/2016  . atorvastatin (LIPITOR) 20 MG tablet Take 20 mg by mouth every evening.  1 09/07/2016 at Unknown time  . budesonide-formoterol (SYMBICORT) 160-4.5 MCG/ACT inhaler Inhale 2 puffs into the lungs 2 (two) times daily.   Past Week at Unknown time  . calcium carbonate (TUMS - DOSED IN MG ELEMENTAL CALCIUM) 500 MG chewable tablet Chew 500 mg by mouth daily as needed for indigestion or heartburn.    09/07/2016 at Unknown time  . cetirizine (ZYRTEC) 10 MG tablet Take 10 mg by mouth daily.   09/07/2016 at Unknown time  . Cholecalciferol (VITAMIN D3) 50000 units CAPS Take 50,000 Units by  mouth once a week. Monday   1   . cyclobenzaprine (FLEXERIL) 10 MG tablet Take 1 tablet (10 mg total) by mouth every 8 (eight) hours as needed for muscle spasms. (Patient taking differently: Take 10 mg by mouth 3 (three) times daily as needed for muscle spasms. ) 30 tablet 1 Past Month at Unknown time  . Diclofenac Sodium (PENNSAID) 2 % SOLN Place onto the skin.     [name] Kitchen esomeprazole (NEXIUM) 40 MG capsule Take 40 mg by mouth daily.    09/07/2016 at Unknown time  . furosemide (LASIX) 20 MG tablet Take 20 mg by mouth daily as needed for fluid.  2   . Guselkumab (TREMFYA [place]) Inject 1 Dose into the skin every 8 (eight) weeks.   08/04/2016  . levothyroxine (SYNTHROID, LEVOTHROID) 50 MCG tablet Take 50 mcg by mouth daily before breakfast.   09/07/2016 at Unknown time  . neomycin-polymyxin-hydrocortisone (CORTISPORIN) otic solution Place 4 drops into the right ear 4 (four) times daily as needed (psoriasis in ear).   Past Month at Unknown time  . nystatin (NYSTATIN) powder Apply topically 4 (four) times daily as needed. Irritation under breast and groin area   Past Month at Unknown time  . sotalol (BETAPACE) 80 MG tablet Take 80 mg by mouth daily.   09/07/2016 at Unknown time  . TREMFYA 100 MG/ML SOSY Inject 100 mg into the skin every 28 (twenty-eight) days.   Past Month at  Unknown time  . apixaban (ELIQUIS) 5 MG TABS tablet Take 5 mg by mouth 2 (two) times daily.   09/06/2016    Assessment: Pharmacy consulted to dose and monitor dofetilide in this 71 year old. QTc  = 454; therefore drug can't be initiated @ this time. . MD [name] patient had sotalol and would like to check QTc on 6/1 in am and start dofetilide from there.    Goal of Therapy:    Plan:  MD [name] would like to give patient a 1 time dose of Magnesium 1 g IV x 1 to reduce the QTc. Order placed and will need to recheck QTc in am and start dofetilide if appropriate.  [name] 09/08/2016,6:49 PM

## 2016-09-09 ENCOUNTER — Inpatient Hospital Stay: Payer: Medicare HMO | Admitting: Registered Nurse

## 2016-09-09 ENCOUNTER — Encounter
Admission: AD | Disposition: A | Discharge status: Home / Self Care | Payer: Self-pay | Source: Ambulatory Visit | Attending: Internal Medicine

## 2016-09-09 ENCOUNTER — Encounter: Payer: Self-pay | Admitting: Anesthesiology

## 2016-09-09 HISTORY — PX: CARDIOVERSION: EP1203

## 2016-09-09 LAB — CBC
HEMATOCRIT: 40.3 % (ref 35.0–47.0)
HEMOGLOBIN: 13.6 g/dL (ref 12.0–16.0)
MCH: 28.2 pg (ref 26.0–34.0)
MCHC: 33.8 g/dL (ref 32.0–36.0)
MCV: 83.5 fL (ref 80.0–100.0)
Platelets: 265 10*3/uL (ref 150–440)
RBC: 4.83 MIL/uL (ref 3.80–5.20)
RDW: 14.4 % (ref 11.5–14.5)
WBC: 6.1 10*3/uL (ref 3.6–11.0)

## 2016-09-09 LAB — BASIC METABOLIC PANEL
ANION GAP: 7 (ref 5–15)
BUN: 17 mg/dL (ref 6–20)
CO2: 26 mmol/L (ref 22–32)
Calcium: 8.2 mg/dL — ABNORMAL LOW (ref 8.9–10.3)
Chloride: 106 mmol/L (ref 101–111)
Creatinine, Ser: 1.01 mg/dL — ABNORMAL HIGH (ref 0.44–1.00)
GFR calc Af Amer: >60 mL/min (ref >60)
GFR calc non Af Amer: 55 mL/min — ABNORMAL LOW (ref >60)
GLUCOSE: 117 mg/dL — AB (ref 65–99)
Potassium: 4.3 mmol/L (ref 3.5–5.1)
Sodium: 139 mmol/L (ref 135–145)

## 2016-09-09 LAB — MAGNESIUM: Magnesium: 2.2 mg/dL (ref 1.7–2.4)

## 2016-09-09 SURGERY — CARDIOVERSION (CATH LAB)
Anesthesia: General

## 2016-09-09 MED ORDER — FENTANYL CITRATE (PF) 100 MCG/2ML IJ SOLN: 25.0000 ug | INTRAMUSCULAR | Status: DC | PRN

## 2016-09-09 MED ORDER — PROPOFOL 10 MG/ML IV BOLUS
INTRAVENOUS | Status: AC
  Filled 2016-09-09: qty 20

## 2016-09-09 MED ORDER — ONDANSETRON HCL 4 MG/2ML IJ SOLN: 4.0000 mg | Freq: Once | INTRAMUSCULAR | Status: DC | PRN

## 2016-09-09 MED ORDER — LORATADINE 10 MG PO TABS
10.0000 mg | ORAL_TABLET | Freq: Every day | ORAL | Status: DC
  Administered 2016-09-10 – 2016-09-11 (×2): 10 mg via ORAL
  Filled 2016-09-09 (×2): qty 1

## 2016-09-09 MED ORDER — FLECAINIDE ACETATE 50 MG PO TABS
50.0000 mg | ORAL_TABLET | Freq: Two times a day (BID) | ORAL | Status: DC
  Administered 2016-09-09 – 2016-09-10 (×3): 50 mg via ORAL
  Filled 2016-09-09 (×3): qty 1

## 2016-09-09 MED ORDER — PROPOFOL 10 MG/ML IV BOLUS
INTRAVENOUS | Status: DC | PRN
  Administered 2016-09-09: 70 mg via INTRAVENOUS

## 2016-09-09 NOTE — Progress Notes (Signed)
71 yo female initially ordered dofetilide. Patient not started on dofetilide overnight due to QTc prolongation. Patient cardioverted this am. Based on telephone conversation from Dr. [name] will discontinue dofetilide and initiate flecainide 50mg  PO Q12hr starting immediately. Patient is currently on telemetry monitoring.   Pharmacy will continue to monitor with you.   Currie Paris, PharmD  [phone]

## 2016-09-09 NOTE — Care Management (Addendum)
Recurrent atrial fib with recent cardioversion.  Back in atrial fib.  Was not able to tolerate Sotolol and amiodarone. For another cardioversion attempt.  Chronic eliquis.

## 2016-09-09 NOTE — Progress Notes (Signed)
SUBJECTIVE: Patient is feeling much better   Vitals:   09/09/16 0811 09/09/16 0812 09/09/16 0813 09/09/16 0815  BP:    127/75  Pulse: (!) 50 (!) 57 61 (!) 49  Resp: (!) 21 (!) 22 (!) 26 17  Temp:      TempSrc:      SpO2: 94% 95% 97% 98%  Weight:      Height:        Intake/Output Summary (Last 24 hours) at 09/09/16 0832 Last data filed at 09/09/16 0810  Gross per 24 hour  Intake           703.49 ml  Output              200 ml  Net           503.49 ml    LABS: Basic Metabolic Panel:  Recent Labs  09/08/16 1444 09/09/16 0355  NA 136 139  K 4.2 4.3  CL 101 106  CO2 28 26  GLUCOSE 90 117*  BUN 20 17  CREATININE 0.79 1.01*  CALCIUM 8.6* 8.2*  MG 2.2 2.2   Liver Function Tests: No results for input(s): AST, ALT, ALKPHOS, BILITOT, PROT, ALBUMIN in the last 72 hours. No results for input(s): LIPASE, AMYLASE in the last 72 hours. CBC:  Recent Labs  09/08/16 1444 09/09/16 0355  WBC 5.6 6.1  HGB 14.4 13.6  HCT 43.5 40.3  MCV 83.6 83.5  PLT 288 265   Cardiac Enzymes: No results for input(s): CKTOTAL, CKMB, CKMBINDEX, TROPONINI in the last 72 hours. BNP: Invalid input(s): POCBNP D-Dimer: No results for input(s): DDIMER in the last 72 hours. Hemoglobin A1C: No results for input(s): HGBA1C in the last 72 hours. Fasting Lipid Panel: No results for input(s): CHOL, HDL, LDLCALC, TRIG, CHOLHDL, LDLDIRECT in the last 72 hours. Thyroid Function Tests: No results for input(s): TSH, T4TOTAL, T3FREE, THYROIDAB in the last 72 hours.  Invalid input(s): FREET3 Anemia Panel: No results for input(s): VITAMINB12, FOLATE, FERRITIN, TIBC, IRON, RETICCTPCT in the last 72 hours.   PHYSICAL EXAM General: Well developed, well nourished, in no acute distress HEENT:  Normocephalic and atramatic Neck:  No JVD.  Lungs: Clear bilaterally to auscultation and percussion. Heart: HRRR . Normal S1 and S2 without gallops or murmurs.  Abdomen: Bowel sounds are positive, abdomen soft and  non-tender  Msk:  Back normal, normal gait. Normal strength and tone for age. Extremities: No clubbing, cyanosis or edema.   Neuro: Alert and oriented X 3. Psych:  Good affect, responds appropriately  TELEMETRY: Sinus rhythm with sinus bradycardia  ASSESSMENT AND PLAN: Sinus bradycardia after electrical cardioversion. Patient did not get dofetilide last night because of prolonged QT interval. We will call pharmacy again after EKG is done after cardioversion to see if they can start dofetilide. If dofetilide cannot be given due to prolonged QT interval will consider the patient for flecainide.  Active Problems:   Atrial fibrillation ([name])    Patricia David, MD, [facility] 09/09/2016 8:32 AM

## 2016-09-09 NOTE — Progress Notes (Signed)
ANTICOAGULATION CONSULT NOTE - Initial Consult  Pharmacy Consult for enoxaparin Indication: atrial fibrillation  Allergies  Allergen Reactions  . Other Itching    States antibiotic in the past caused itching but can not remember name   Patient Measurements: Height: 5\' 4"  (162.6 cm) Weight: (!) 301 lb (136.5 kg) IBW/kg (Calculated) : 54.7  Vital Signs: Temp: 98.2 F (36.8 C) (06/01 0722) Temp Source: Oral (06/01 0722) BP: 118/81 (06/01 0845) Pulse Rate: 53 (06/01 0845)  Labs:  Recent Labs  09/08/16 1444 09/09/16 0355  HGB 14.4 13.6  HCT 43.5 40.3  PLT 288 265  LABPROT 12.9  --   INR 0.97  --   CREATININE 0.79 1.01*    Estimated Creatinine Clearance: 71.5 mL/min (A) (by C-G formula based on SCr of 1.01 mg/dL (H)).   Medical History: Past Medical History:  Diagnosis Date  . Atrial fibrillation ([place])   . Coronary artery disease   . Hypertension   . MI, old   . Psoriasis     Medications:  Scheduled:  . atorvastatin  20 mg Oral QPM  . enoxaparin (LOVENOX) injection  1 mg/kg Subcutaneous Q12H  . flecainide  50 mg Oral Q12H  . levothyroxine  50 mcg Oral QAC breakfast  . mometasone-formoterol  2 puff Inhalation BID  . pantoprazole  40 mg Oral Daily  . sodium chloride flush  3 mL Intravenous Q12H  . [START ON 09/12/2016] Vitamin D3  50,000 Units Oral Weekly    Assessment: Patient was taking apixaban prior to admission for atrial fibrillation which was being held prior to procedure. Therapeutic enoxaparin was started on 5/31 8 hours post sheath removal.  Plan:  Continue 1 mg/kg SubQ q12 hours. CBC and SCr to be checked at least every 72 hours per protocol while inpatient.  Lenis Noon, PharmD Clinical Pharmacist 09/09/2016,11:09 AM

## 2016-09-09 NOTE — Anesthesia Postprocedure Evaluation (Signed)
Anesthesia Post Note  Patient: Patricia Walker  Procedure(s) Performed: Procedure(s) (LRB): Cardioversion (N/A)  Patient location during evaluation: Other Anesthesia Type: General Level of consciousness: awake and alert and oriented Pain management: pain level controlled Vital Signs Assessment: post-procedure vital signs reviewed and stable Respiratory status: spontaneous breathing Cardiovascular status: blood pressure returned to baseline Anesthetic complications: no     Last Vitals:  Vitals:   09/09/16 0830 09/09/16 0845  BP: 122/80 118/81  Pulse: (!) 51 (!) 53  Resp: 17 14  Temp:      Last Pain:  Vitals:   09/09/16 0722  TempSrc: Oral  PainSc:                  Dreyah Montrose

## 2016-09-09 NOTE — Progress Notes (Signed)
Patient clinically stable post cardioversion to sinus brady with occ. Pac's. Denies complaints, vitals stable report called to Josefina Do on telemetry with plan reviewed. Dr [name] here to speak with patient with questions answered.

## 2016-09-09 NOTE — Progress Notes (Signed)
Patient off unit for cardiac procedure. Will resume care upon return. Patricia Walker  

## 2016-09-09 NOTE — Transfer of Care (Signed)
Immediate Anesthesia Transfer of Care Note  Patient: Patricia Walker  Procedure(s) Performed: Procedure(s): Cardioversion (N/A)  Patient Location: PACU and Short Stay  Anesthesia Type:General  Level of Consciousness: awake, alert  and oriented  Airway & Oxygen Therapy: Patient Spontanous Breathing and Patient connected to nasal cannula oxygen  Post-op Assessment: Report given to RN and Post -op Vital signs reviewed and stable  Post vital signs: Reviewed and stable  Last Vitals:  Vitals:   09/09/16 0812 09/09/16 0813  BP:    Pulse: (!) 57 61  Resp: (!) 22 (!) 26  Temp:      Complications: No apparent anesthesia complications

## 2016-09-09 NOTE — Care Management Important Message (Signed)
Important Message  Patient Details  Name: Patricia Walker MRN: [id number] Date of Birth: October 22, 1945   Medicare Important Message Given:  Yes Signed IM notice given    Katrina Stack, RN 09/09/2016, 6:33 PM

## 2016-09-09 NOTE — Anesthesia Preprocedure Evaluation (Signed)
Anesthesia Evaluation  Patient identified by MRN, date of birth, ID band Patient awake    Reviewed: Allergy & Precautions, NPO status , Patient's Chart, lab work & pertinent test results, reviewed documented beta blocker date and time   History of Anesthesia Complications Negative for: history of anesthetic complications  Airway Mallampati: II  TM Distance: >3 FB Neck ROM: Full    Dental  (+) Poor Dentition, Missing   Pulmonary sleep apnea and Continuous Positive Airway Pressure Ventilation , neg COPD, former smoker,    breath sounds clear to auscultation- rhonchi (-) wheezing      Cardiovascular hypertension, Pt. on home beta blockers and Pt. on medications (-) angina+ CAD and + Past MI  (-) Cardiac Stents and (-) CABG  Rhythm:Irregular Rate:Normal - Systolic murmurs and - Diastolic murmurs Echo 2/87/68: Grade 3 diastolic dysfunction, Normal LF systolic function, mild MR   Neuro/Psych negative neurological ROS  negative psych ROS   GI/Hepatic negative GI ROS, Neg liver ROS,   Endo/Other  neg diabetesHypothyroidism   Renal/GU negative Renal ROS     Musculoskeletal negative musculoskeletal ROS (+)   Abdominal (+) + obese,   Peds  Hematology negative hematology ROS (+)   Anesthesia Other Findings Past Medical History: No date: Atrial fibrillation ([facility]) No date: Coronary artery disease No date: Hypertension No date: MI, old   Reproductive/Obstetrics                             Anesthesia Physical  Anesthesia Plan  ASA: III  Anesthesia Plan: General   Post-op Pain Management:    Induction: Intravenous  Airway Management Planned: Nasal Cannula  Additional Equipment:   Intra-op Plan:   Post-operative Plan:   Informed Consent: I have reviewed the patients History and Physical, chart, labs and discussed the procedure including the risks, benefits and alternatives for the  proposed anesthesia with the patient or authorized representative who has indicated his/her understanding and acceptance.   Dental advisory given  Plan Discussed with: CRNA and Anesthesiologist  Anesthesia Plan Comments:         Anesthesia Quick Evaluation

## 2016-09-09 NOTE — Anesthesia Procedure Notes (Signed)
Date/Time: 09/09/2016 7:57 AM Performed by: Doreen Salvage Pre-anesthesia Checklist: Patient identified, Emergency Drugs available, Suction available and Patient being monitored Patient Re-evaluated:Patient Re-evaluated prior to inductionOxygen Delivery Method: Nasal cannula Intubation Type: IV induction Dental Injury: Teeth and Oropharynx as per pre-operative assessment  Comments: Nasal cannula with etCO2 monitoring

## 2016-09-09 NOTE — Progress Notes (Signed)
  NAME:  Patricia Walker   MRN: [id number] DOB:  06-28-1945   ADMIT DATE: 09/08/2016  Procedure: Electrical Cardioversion Indications:  Atrial Fibrillation  Procedure Details:    Time Out: Verified patient identification, verified procedure, site/side was marked, verified correct patient position, special equipment/implants available, medications/allergies/relevent history reviewed, required imaging and test results available.    Patient placed on cardiac monitor, pulse oximetry, supplemental oxygen as necessary.  Sedation given:  Pacer pads placed   Cardioverted . 200 J Cardioverted at 200 J  Evaluation: Findings: Post procedure EKG shows: Normal sinus rhythm with sinus bradycardia Complications: None Patient did well.     [name], M.D. [facility]   09/09/2016 8:31 AM

## 2016-09-09 NOTE — Anesthesia Post-op Follow-up Note (Cosign Needed)
Anesthesia QCDR form completed.        

## 2016-09-09 NOTE — Progress Notes (Signed)
CCMD reported 2.5 sec pause. MS aware, pt asymptomatic. Will continue to monitor

## 2016-09-09 NOTE — Progress Notes (Signed)
[facility] at [place] NAME: Patricia Walker    MR#:  [id number]  DATE OF BIRTH:  [birth year]  SUBJECTIVE admitted  for persistent A. fib. Failed a sotalol, amiodarone, previous cardioversion, remained in A. fib. Patient did have cardioversion this morning again. Now in sinus rhythm but has bradycardia. Has any complaints.   CHIEF COMPLAINT:  No chief complaint on file.   REVIEW OF SYSTEMS:    Review of Systems  Constitutional: Negative for chills and fever.  HENT: Negative for hearing loss.   Eyes: Negative for blurred vision, double vision and photophobia.  Respiratory: Negative for cough, hemoptysis and shortness of breath.   Cardiovascular: Negative for palpitations, orthopnea and leg swelling.  Gastrointestinal: Negative for abdominal pain, diarrhea and vomiting.  Genitourinary: Negative for dysuria and urgency.  Musculoskeletal: Negative for myalgias and neck pain.  Skin: Negative for rash.  Neurological: Negative for dizziness, focal weakness, seizures, weakness and headaches.  Psychiatric/Behavioral: Negative for memory loss. The patient does not have insomnia.     Nutrition:  Tolerating Diet: Tolerating PT:      DRUG ALLERGIES:   Allergies  Allergen Reactions  . Other Itching    States antibiotic in the past caused itching but can not remember name    VITALS:  Blood pressure (!) 142/79, pulse (!) 55, temperature 97.7 F (36.5 C), temperature source Oral, resp. rate 14, height 5\' 4"  (1.626 m), weight (!) 136.5 kg (301 lb), SpO2 99 %.  PHYSICAL EXAMINATION:   Physical Exam  GENERAL:  71 y.o.-year-old patient lying in the bed with no acute distress.  EYES: Pupils equal, round, reactive to light and accommodation. No scleral icterus. Extraocular muscles intact.  HEENT: Head atraumatic, normocephalic. Oropharynx and nasopharynx clear.  NECK:  Supple, no jugular venous distention. No thyroid enlargement, no tenderness.   LUNGS: Normal breath sounds bilaterally, no wheezing, rales,rhonchi or crepitation. No use of accessory muscles of respiration.  CARDIOVASCULAR: S1, S2 normal. No murmurs, rubs, or gallops.  ABDOMEN: Soft, nontender, nondistended. Bowel sounds present. No organomegaly or mass.  EXTREMITIES: No pedal edema, cyanosis, or clubbing.  NEUROLOGIC: Cranial nerves II through XII are intact. Muscle strength 5/5 in all extremities. Sensation intact. Gait not checked.  PSYCHIATRIC: The patient is alert and oriented x 3.  SKIN: No obvious rash, lesion, or ulcer.    LABORATORY PANEL:   CBC  Recent Labs Lab 09/09/16 0355  WBC 6.1  HGB 13.6  HCT 40.3  PLT 265   ------------------------------------------------------------------------------------------------------------------  Chemistries   Recent Labs Lab 09/09/16 0355  NA 139  K 4.3  CL 106  CO2 26  GLUCOSE 117*  BUN 17  CREATININE 1.01*  CALCIUM 8.2*  MG 2.2   ------------------------------------------------------------------------------------------------------------------  Cardiac Enzymes No results for input(s): TROPONINI in the last 168 hours. ------------------------------------------------------------------------------------------------------------------  RADIOLOGY:  No results found.   ASSESSMENT AND PLAN:   Active Problems:   Atrial fibrillation (HCC)   : Persistent atrial fibrillation failed on sotalol, initial cardioversion, intolerant to amiodarone side effects, patient did not remain in sinus rhythm despite intial cardioversion so admitted to medical service, patient did have cardioversion again this morning, now in sinus rhythm but has bradycardia; Prolonged QT interval. Unable to get Tikosyn because of QT prolongation, started on flecainide 50 mg every 12 hours, continue full dose Lovenox. #2. hypothyroidism: Continue levothyroxine/ #3 obstructive sleep apnea: Continue CPAP at night,  History of COPD: No  wheezing. Continue inhalers.  All the records are  reviewed and case discussed with Care Management/Social Workerr. Management plans discussed with the patient, family and they are in agreement.  CODE STATUS: full  TOTAL TIME TAKING CARE OF THIS PATIENT: 76minutes.   POSSIBLE D/C IN 1-2 DAYS, DEPENDING ON CLINICAL CONDITION.   Epifanio Lesches M.D on 09/09/2016 at 11:41 AM  Between 7am to 6pm - Pager - [phone]  After 6pm go to [email or web address] - password [email or web address] [facility]  Office  [phone]  CC: Primary care physician; Perrin Maltese, MD

## 2016-09-10 LAB — BASIC METABOLIC PANEL
ANION GAP: 6 (ref 5–15)
BUN: 17 mg/dL (ref 6–20)
CALCIUM: 8.4 mg/dL — AB (ref 8.9–10.3)
CHLORIDE: 105 mmol/L (ref 101–111)
CO2: 27 mmol/L (ref 22–32)
CREATININE: 1 mg/dL (ref 0.44–1.00)
GFR calc non Af Amer: 56 mL/min — ABNORMAL LOW (ref >60)
Glucose, Bld: 131 mg/dL — ABNORMAL HIGH (ref 65–99)
Potassium: 3.9 mmol/L (ref 3.5–5.1)
SODIUM: 138 mmol/L (ref 135–145)

## 2016-09-10 LAB — MAGNESIUM: Magnesium: 2 mg/dL (ref 1.7–2.4)

## 2016-09-10 MED ORDER — FLECAINIDE ACETATE 100 MG PO TABS
100.0000 mg | ORAL_TABLET | Freq: Two times a day (BID) | ORAL | Status: DC
  Administered 2016-09-10 – 2016-09-11 (×2): 100 mg via ORAL
  Filled 2016-09-10 (×3): qty 1

## 2016-09-10 NOTE — Progress Notes (Signed)
[facility] at [place] NAME: Patricia Walker    MR#:  [id number]  DATE OF BIRTH:  [birth year]  SUBJECTIVE admitted  for persistent A. fib. Failed a sotalol, amiodarone, previous cardioversion, remained in A. fib. Patient did have cardioversion this morning again. Now in sinus rhythm but has bradycardia. Has any complaints.   CHIEF COMPLAINT:  No chief complaint on file.  The patient has no complaints. REVIEW OF SYSTEMS:    Review of Systems  Constitutional: Negative for chills and fever.  HENT: Negative for hearing loss.   Eyes: Negative for blurred vision, double vision and photophobia.  Respiratory: Negative for cough, hemoptysis and shortness of breath.   Cardiovascular: Negative for palpitations, orthopnea and leg swelling.  Gastrointestinal: Negative for abdominal pain, diarrhea and vomiting.  Genitourinary: Negative for dysuria and urgency.  Musculoskeletal: Negative for myalgias and neck pain.  Skin: Negative for rash.  Neurological: Negative for dizziness, focal weakness, seizures, weakness and headaches.  Psychiatric/Behavioral: Negative for memory loss. The patient does not have insomnia.     DRUG ALLERGIES:   Allergies  Allergen Reactions  . Other Itching    States antibiotic in the past caused itching but can not remember name    VITALS:  Blood pressure (!) 119/44, pulse (!) 59, temperature 97.8 F (36.6 C), temperature source Oral, resp. rate 20, height 5\' 4"  (1.626 m), weight (!) 313 lb 12.8 oz (142.3 kg), SpO2 97 %.  PHYSICAL EXAMINATION:   Physical Exam  GENERAL:  71 y.o.-year-old patient lying in the bed with no acute distress. Morbid obesity. EYES: Pupils equal, round, reactive to light and accommodation. No scleral icterus. Extraocular muscles intact.  HEENT: Head atraumatic, normocephalic. Oropharynx and nasopharynx clear.  NECK:  Supple, no jugular venous distention. No thyroid enlargement, no tenderness.   LUNGS: Normal breath sounds bilaterally, no wheezing, rales,rhonchi or crepitation. No use of accessory muscles of respiration.  CARDIOVASCULAR: S1, S2 normal. No murmurs, rubs, or gallops.  ABDOMEN: Soft, nontender, nondistended. Bowel sounds present. No organomegaly or mass.  EXTREMITIES: No pedal edema, cyanosis, or clubbing.  NEUROLOGIC: Cranial nerves II through XII are intact. Muscle strength 5/5 in all extremities. Sensation intact. Gait not checked.  PSYCHIATRIC: The patient is alert and oriented x 3.  SKIN: No obvious rash, lesion, or ulcer.    LABORATORY PANEL:   CBC  Recent Labs Lab 09/09/16 0355  WBC 6.1  HGB 13.6  HCT 40.3  PLT 265   ------------------------------------------------------------------------------------------------------------------  Chemistries   Recent Labs Lab 09/10/16 0502  NA 138  K 3.9  CL 105  CO2 27  GLUCOSE 131*  BUN 17  CREATININE 1.00  CALCIUM 8.4*  MG 2.0   ------------------------------------------------------------------------------------------------------------------  Cardiac Enzymes No results for input(s): TROPONINI in the last 168 hours. ------------------------------------------------------------------------------------------------------------------  RADIOLOGY:  No results found.   ASSESSMENT AND PLAN:   Active Problems:   Atrial fibrillation (HCC)   : Persistent atrial fibrillation failed on sotalol, initial cardioversion, intolerant to amiodarone side effects, patient did not remain in sinus rhythm despite intial cardioversion so admitted to medical service, patient did have cardioversion again this morning, now in sinus rhythm but has bradycardia; Prolonged QT interval. Unable to get Tikosyn because of QT prolongation, started on flecainide 100 mg every 12 hours, continue full dose Lovenox. Change to Eliquis tomorrow per Dr. [name].  #2. hypothyroidism: Continue levothyroxine. #3 obstructive sleep apnea:  Continue CPAP at night,  History of COPD: No wheezing. Continue inhalers. Morbid  obesity.  Discussed with Dr. [name]. All the records are reviewed and case discussed with Care Management/Social Workerr. Management plans discussed with the patient, family and they are in agreement.  CODE STATUS: full  TOTAL TIME TAKING CARE OF THIS PATIENT: 28 minutes.   POSSIBLE D/C IN 1 DAYS, DEPENDING ON CLINICAL CONDITION.   Demetrios Loll M.D on 09/10/2016 at 3:19 PM  Between 7am to 6pm - Pager - [phone]  After 6pm go to [email or web address] - password [email or web address] [facility]  Office  [phone]  CC: Primary care physician; Perrin Maltese, MD

## 2016-09-10 NOTE — Progress Notes (Signed)
SUBJECTIVE: Patient has headache and dizziness and just doesn't feel good   Vitals:   09/09/16 1132 09/09/16 1928 09/10/16 0416 09/10/16 0500  BP: (!) 142/79 (!) 115/59 (!) 126/59   Pulse: (!) 55 (!) 55 (!) 53   Resp:  20 20   Temp: 97.7 F (36.5 C) 98.2 F (36.8 C) 97.8 F (36.6 C)   TempSrc: Oral Oral Oral   SpO2: 99% 96% 96%   Weight:    (!) 313 lb 12.8 oz (142.3 kg)  Height:        Intake/Output Summary (Last 24 hours) at 09/10/16 0957 Last data filed at 09/09/16 1700  Gross per 24 hour  Intake              480 ml  Output                0 ml  Net              480 ml    LABS: Basic Metabolic Panel:  Recent Labs  09/09/16 0355 09/10/16 0502  NA 139 138  K 4.3 3.9  CL 106 105  CO2 26 27  GLUCOSE 117* 131*  BUN 17 17  CREATININE 1.01* 1.00  CALCIUM 8.2* 8.4*  MG 2.2 2.0   Liver Function Tests: No results for input(s): AST, ALT, ALKPHOS, BILITOT, PROT, ALBUMIN in the last 72 hours. No results for input(s): LIPASE, AMYLASE in the last 72 hours. CBC:  Recent Labs  09/08/16 1444 09/09/16 0355  WBC 5.6 6.1  HGB 14.4 13.6  HCT 43.5 40.3  MCV 83.6 83.5  PLT 288 265   Cardiac Enzymes: No results for input(s): CKTOTAL, CKMB, CKMBINDEX, TROPONINI in the last 72 hours. BNP: Invalid input(s): POCBNP D-Dimer: No results for input(s): DDIMER in the last 72 hours. Hemoglobin A1C: No results for input(s): HGBA1C in the last 72 hours. Fasting Lipid Panel: No results for input(s): CHOL, HDL, LDLCALC, TRIG, CHOLHDL, LDLDIRECT in the last 72 hours. Thyroid Function Tests: No results for input(s): TSH, T4TOTAL, T3FREE, THYROIDAB in the last 72 hours.  Invalid input(s): FREET3 Anemia Panel: No results for input(s): VITAMINB12, FOLATE, FERRITIN, TIBC, IRON, RETICCTPCT in the last 72 hours.   PHYSICAL EXAM General: Well developed, well nourished, in no acute distress HEENT:  Normocephalic and atramatic Neck:  No JVD.  Lungs: Clear bilaterally to auscultation  and percussion. Heart: HRRR . Normal S1 and S2 without gallops or murmurs.  Abdomen: Bowel sounds are positive, abdomen soft and non-tender  Msk:  Back normal, normal gait. Normal strength and tone for age. Extremities: No clubbing, cyanosis or edema.   Neuro: Alert and oriented X 3. Psych:  Good affect, responds appropriately  TELEMETRY:Sinus bradycardia about 58 bpm  ASSESSMENT AND PLAN: Status post cardioversion for atrial fibrillation remains in sinus bradycardia. Advise increasing the dosage of flecainide 200 twice a day and can be discharged tomorrow on flecainide 100 twice a day and I'll request 5 mg twice a day.    [name], MD, [facility] 09/10/2016 9:57 AM

## 2016-09-11 LAB — BASIC METABOLIC PANEL
Anion gap: 6 (ref 5–15)
BUN: 17 mg/dL (ref 6–20)
CO2: 28 mmol/L (ref 22–32)
Calcium: 8.5 mg/dL — ABNORMAL LOW (ref 8.9–10.3)
Chloride: 103 mmol/L (ref 101–111)
Creatinine, Ser: 0.73 mg/dL (ref 0.44–1.00)
GFR calc Af Amer: >60 mL/min (ref >60)
GFR calc non Af Amer: >60 mL/min (ref >60)
Glucose, Bld: 123 mg/dL — ABNORMAL HIGH (ref 65–99)
Potassium: 3.7 mmol/L (ref 3.5–5.1)
Sodium: 137 mmol/L (ref 135–145)

## 2016-09-11 LAB — MAGNESIUM: Magnesium: 1.9 mg/dL (ref 1.7–2.4)

## 2016-09-11 MED ORDER — FLECAINIDE ACETATE 100 MG PO TABS: 100.0000 mg | ORAL_TABLET | Freq: Two times a day (BID) | ORAL | 0 refills | Status: DC

## 2016-09-11 MED ORDER — APIXABAN 5 MG PO TABS
5.0000 mg | ORAL_TABLET | Freq: Two times a day (BID) | ORAL | Status: DC
  Administered 2016-09-11: 5 mg via ORAL
  Filled 2016-09-11: qty 1

## 2016-09-11 NOTE — Progress Notes (Signed)
ANTICOAGULATION CONSULT NOTE - Initial Consult  Pharmacy Consult for apixaban Indication: atrial fibrillation  Allergies  Allergen Reactions  . Other Itching    States antibiotic in the past caused itching but can not remember name   Patient Measurements: Height: 5\' 4"  (162.6 cm) Weight: (!) 300 lb 9.6 oz (136.4 kg) IBW/kg (Calculated) : 54.7  Vital Signs: Temp: 97.7 F (36.5 C) (06/03 0438) Temp Source: Oral (06/03 0438) BP: 124/40 (06/03 0438) Pulse Rate: 51 (06/03 0438)  Labs:  Recent Labs  09/08/16 1444 09/09/16 0355 09/10/16 0502 09/11/16 0409  HGB 14.4 13.6  --   --   HCT 43.5 40.3  --   --   PLT 288 265  --   --   LABPROT 12.9  --   --   --   INR 0.97  --   --   --   CREATININE 0.79 1.01* 1.00 0.73   Estimated Creatinine Clearance: 90.3 mL/min (by C-G formula based on SCr of 0.73 mg/dL).  Medical History: Past Medical History:  Diagnosis Date  . Atrial fibrillation ([place])   . Coronary artery disease   . Hypertension   . MI, old   . Psoriasis     Assessment: Patient is currently on therapeutic enoxaparin for anticoagulation, but is being transitioned back to apixaban. Patient was taking apixaban 5 mg PO BID PTA.  Goal of Therapy:  Monitor platelets by anticoagulation protocol: Yes   Plan:  Discontinued enoxaparin (lat dose 6/2 @ 2130) Will start apixaban 5 mg PO BID at 1000 this morning.  Lenis Noon, PharmD Clinical Pharmacist 09/11/2016,8:07 AM

## 2016-09-11 NOTE — Discharge Summary (Signed)
[facility] at [place] NAME: Patricia Walker    MR#:  [id number]  DATE OF BIRTH:  Mar 09, 1946  DATE OF ADMISSION:  09/08/2016   ADMITTING PHYSICIAN: [name], MD  DATE OF DISCHARGE: 09/11/2016  PRIMARY CARE PHYSICIAN: Perrin Maltese, MD   ADMISSION DIAGNOSIS:  A- Fib and chest pain Chest pain atrial fibrillation DISCHARGE DIAGNOSIS:  Active Problems:   Atrial fibrillation ([place])  SECONDARY DIAGNOSIS:   Past Medical History:  Diagnosis Date  . Atrial fibrillation ([place])   . Coronary artery disease   . Hypertension   . MI, old   . Psoriasis    HOSPITAL COURSE:  Atrial fibrillation (HCC) Persistent atrial fibrillation failed on sotalol, initial cardioversion, intolerant to amiodarone side effects, patient did not remain in sinus rhythm despite intial cardioversion so admitted to medical service, patient did have cardioversion again, in sinus rhythm but has bradycardia; Prolonged QT interval. Unable to get Tikosyn because of QT prolongation, started on flecainide 100 mg every 12 hours, on full dose Lovenox. Changed to Eliquis today per Dr. [name].  #2. hypothyroidism: Continue levothyroxine. #3 obstructive sleep apnea: Continue CPAP at night,  History of COPD: No wheezing. Continue inhalers. Morbid obesity. DISCHARGE CONDITIONS:  Stable, Discharge to home today. CONSULTS OBTAINED:  Treatment Team:  [name], MD DRUG ALLERGIES:   Allergies  Allergen Reactions  . Other Itching    States antibiotic in the past caused itching but can not remember name   DISCHARGE MEDICATIONS:   Allergies as of 09/11/2016      Reactions   Other Itching   States antibiotic in the past caused itching but can not remember name      Medication List    STOP taking these medications   PENNSAID 2 % Soln Generic drug:  Diclofenac Sodium   sotalol 80 MG tablet Commonly known as:  BETAPACE     TAKE these medications   acetaminophen 325  MG tablet Commonly known as:  TYLENOL Take 650 mg by mouth 2 (two) times daily as needed for mild pain or headache.   apixaban 5 MG Tabs tablet Commonly known as:  ELIQUIS Take 5 mg by mouth 2 (two) times daily.   atorvastatin 20 MG tablet Commonly known as:  LIPITOR Take 20 mg by mouth every evening.   budesonide-formoterol 160-4.5 MCG/ACT inhaler Commonly known as:  SYMBICORT Inhale 2 puffs into the lungs 2 (two) times daily.   calcium carbonate 500 MG chewable tablet Commonly known as:  TUMS - dosed in mg elemental calcium Chew 500 mg by mouth daily as needed for indigestion or heartburn.   cetirizine 10 MG tablet Commonly known as:  ZYRTEC Take 10 mg by mouth daily.   cyclobenzaprine 10 MG tablet Commonly known as:  FLEXERIL Take 1 tablet (10 mg total) by mouth every 8 (eight) hours as needed for muscle spasms. What changed:  when to take this   esomeprazole 40 MG capsule Commonly known as:  NEXIUM Take 40 mg by mouth daily.   flecainide 100 MG tablet Commonly known as:  TAMBOCOR Take 1 tablet (100 mg total) by mouth every 12 (twelve) hours.   furosemide 20 MG tablet Commonly known as:  LASIX Take 20 mg by mouth daily as needed for fluid.   levothyroxine 50 MCG tablet Commonly known as:  SYNTHROID, LEVOTHROID Take 50 mcg by mouth daily before breakfast.   neomycin-polymyxin-hydrocortisone otic solution Commonly known as:  CORTISPORIN Place 4  drops into the right ear 4 (four) times daily as needed (psoriasis in ear).   nystatin powder Generic drug:  nystatin Apply topically 4 (four) times daily as needed. Irritation under breast and groin area   TREMFYA [place] Inject 1 Dose into the skin every 8 (eight) weeks.   TREMFYA 100 MG/ML [place] Generic drug:  Guselkumab Inject 100 mg into the skin every 28 (twenty-eight) days.   Vitamin D3 50000 units Caps Take 50,000 Units by mouth once a week. Monday        DISCHARGE INSTRUCTIONS:  See AVS.  If you  experience worsening of your admission symptoms, develop shortness of breath, life threatening emergency, suicidal or homicidal thoughts you must seek medical attention immediately by calling 911 or calling your MD immediately  if symptoms less severe.  You Must read complete instructions/literature along with all the possible adverse reactions/side effects for all the Medicines you take and that have been prescribed to you. Take any new Medicines after you have completely understood and accpet all the possible adverse reactions/side effects.   Please note  You were cared for by a hospitalist during your hospital stay. If you have any questions about your discharge medications or the care you received while you were in the hospital after you are discharged, you can call the unit and asked to speak with the hospitalist on call if the hospitalist that took care of you is not available. Once you are discharged, your primary care physician will handle any further medical issues. Please note that NO REFILLS for any discharge medications will be authorized once you are discharged, as it is imperative that you return to your primary care physician (or establish a relationship with a primary care physician if you do not have one) for your aftercare needs so that they can reassess your need for medications and monitor your lab values.    On the day of Discharge:  VITAL SIGNS:  Blood pressure (!) 117/49, pulse (!) 50, temperature 98 F (36.7 C), temperature source Oral, resp. rate 16, height 5\' 4"  (1.626 m), weight (!) 300 lb 9.6 oz (136.4 kg), SpO2 95 %. PHYSICAL EXAMINATION:  GENERAL:  71 y.o.-year-old patient lying in the bed with no acute distress. Morbid obese. EYES: Pupils equal, round, reactive to light and accommodation. No scleral icterus. Extraocular muscles intact.  HEENT: Head atraumatic, normocephalic. Oropharynx and nasopharynx clear.  NECK:  Supple, no jugular venous distention. No thyroid  enlargement, no tenderness.  LUNGS: Normal breath sounds bilaterally, no wheezing, rales,rhonchi or crepitation. No use of accessory muscles of respiration.  CARDIOVASCULAR: S1, S2 normal. No murmurs, rubs, or gallops.  ABDOMEN: Soft, non-tender, non-distended. Bowel sounds present. No organomegaly or mass.  EXTREMITIES: No pedal edema, cyanosis, or clubbing.  NEUROLOGIC: Cranial nerves II through XII are intact. Muscle strength 5/5 in all extremities. Sensation intact. Gait not checked.  PSYCHIATRIC: The patient is alert and oriented x 3.  SKIN: No obvious rash, lesion, or ulcer.  DATA REVIEW:   CBC  Recent Labs Lab 09/09/16 0355  WBC 6.1  HGB 13.6  HCT 40.3  PLT 265    Chemistries   Recent Labs Lab 09/11/16 0409  NA 137  K 3.7  CL 103  CO2 28  GLUCOSE 123*  BUN 17  CREATININE 0.73  CALCIUM 8.5*  MG 1.9     Microbiology Results  Results for orders placed or performed in visit on 07/13/12  Urine culture     Status: None  Collection Time: 07/14/12  8:22 AM  Result Value Ref Range Status   Micro Text Report   Final       COMMENT                   MIXED BACTERIAL ORGANISMS   COMMENT                   RESULTS SUGGESTIVE OF CONTAMINATION   ANTIBIOTIC                                                        RADIOLOGY:  No results found.   Management plans discussed with the patient, family and they are in agreement.  CODE STATUS: Full Code   TOTAL TIME TAKING CARE OF THIS PATIENT: 31 minutes.    Demetrios Loll M.D on 09/11/2016 at 11:25 AM  Between 7am to 6pm - Pager - [phone]  After 6pm go to [email or web address] - Proofreader  [facility]  Office  [phone]  CC: Primary care physician; Perrin Maltese, MD   Note: This dictation was prepared with Dragon dictation along with smaller phrase technology. Any transcriptional errors that result from this process are unintentional.

## 2016-09-11 NOTE — Discharge Instructions (Signed)
Heart healthy diet

## 2016-09-14 ENCOUNTER — Emergency Department: Payer: Medicare HMO

## 2016-09-14 ENCOUNTER — Emergency Department
Admission: EM | Admit: 2016-09-14 | Discharge: 2016-09-14 | Disposition: A | Discharge status: Home / Self Care | Payer: Medicare HMO | Attending: Emergency Medicine | Admitting: Emergency Medicine

## 2016-09-14 DIAGNOSIS — I4891 Unspecified atrial fibrillation: Secondary | ICD-10-CM | POA: Diagnosis not present

## 2016-09-14 DIAGNOSIS — R079 Chest pain, unspecified: Secondary | ICD-10-CM | POA: Diagnosis not present

## 2016-09-14 DIAGNOSIS — R002 Palpitations: Secondary | ICD-10-CM | POA: Diagnosis present

## 2016-09-14 DIAGNOSIS — I10 Essential (primary) hypertension: Secondary | ICD-10-CM | POA: Diagnosis not present

## 2016-09-14 DIAGNOSIS — Z87891 Personal history of nicotine dependence: Secondary | ICD-10-CM | POA: Diagnosis not present

## 2016-09-14 DIAGNOSIS — I252 Old myocardial infarction: Secondary | ICD-10-CM | POA: Diagnosis not present

## 2016-09-14 DIAGNOSIS — I2510 Atherosclerotic heart disease of native coronary artery without angina pectoris: Secondary | ICD-10-CM | POA: Insufficient documentation

## 2016-09-14 DIAGNOSIS — R0602 Shortness of breath: Secondary | ICD-10-CM | POA: Insufficient documentation

## 2016-09-14 DIAGNOSIS — Z79899 Other long term (current) drug therapy: Secondary | ICD-10-CM | POA: Insufficient documentation

## 2016-09-14 DIAGNOSIS — R0789 Other chest pain: Secondary | ICD-10-CM

## 2016-09-14 DIAGNOSIS — Z7901 Long term (current) use of anticoagulants: Secondary | ICD-10-CM | POA: Insufficient documentation

## 2016-09-14 DIAGNOSIS — Z7951 Long term (current) use of inhaled steroids: Secondary | ICD-10-CM | POA: Insufficient documentation

## 2016-09-14 LAB — HEPATIC FUNCTION PANEL
ALBUMIN: 3.8 g/dL (ref 3.5–5.0)
ALT: 28 U/L (ref 14–54)
AST: 29 U/L (ref 15–41)
Alkaline Phosphatase: 83 U/L (ref 38–126)
BILIRUBIN TOTAL: 0.7 mg/dL (ref 0.3–1.2)
Bilirubin, Direct: 0.1 mg/dL (ref 0.1–0.5)
Indirect Bilirubin: 0.6 mg/dL (ref 0.3–0.9)
Total Protein: 7.6 g/dL (ref 6.5–8.1)

## 2016-09-14 LAB — BASIC METABOLIC PANEL
ANION GAP: 9 (ref 5–15)
BUN: 15 mg/dL (ref 6–20)
CHLORIDE: 103 mmol/L (ref 101–111)
CO2: 25 mmol/L (ref 22–32)
CREATININE: 0.93 mg/dL (ref 0.44–1.00)
Calcium: 9.2 mg/dL (ref 8.9–10.3)
GFR calc non Af Amer: >60 mL/min (ref >60)
Glucose, Bld: 124 mg/dL — ABNORMAL HIGH (ref 65–99)
Potassium: 3.9 mmol/L (ref 3.5–5.1)
SODIUM: 137 mmol/L (ref 135–145)

## 2016-09-14 LAB — CBC
HCT: 40.1 % (ref 35.0–47.0)
HEMOGLOBIN: 13.7 g/dL (ref 12.0–16.0)
MCH: 28.3 pg (ref 26.0–34.0)
MCHC: 34.1 g/dL (ref 32.0–36.0)
MCV: 83 fL (ref 80.0–100.0)
PLATELETS: 264 10*3/uL (ref 150–440)
RBC: 4.84 MIL/uL (ref 3.80–5.20)
RDW: 14.4 % (ref 11.5–14.5)
WBC: 7.2 10*3/uL (ref 3.6–11.0)

## 2016-09-14 LAB — TROPONIN I

## 2016-09-14 LAB — BRAIN NATRIURETIC PEPTIDE: B Natriuretic Peptide: 215 pg/mL — ABNORMAL HIGH (ref 0.0–100.0)

## 2016-09-14 MED ORDER — ASPIRIN 81 MG PO CHEW
162.0000 mg | CHEWABLE_TABLET | Freq: Once | ORAL | Status: AC
  Administered 2016-09-14: 162 mg via ORAL
  Filled 2016-09-14: qty 2

## 2016-09-14 NOTE — Discharge Instructions (Addendum)
YOU MUST STOP TAKING FLECANIDE.  Please make an appointment to follow-up with Dr. Clayborn Bigness who is on call for your Cardiologist Dr. [name].  Please see him either this week or sometime early next week for a recheck.  Return to the emergency department for any concerns.  It was a pleasure to take care of you today, and thank you for coming to our emergency department.  If you have any questions or concerns before leaving please ask the nurse to grab me and I'm more than happy to go through your aftercare instructions again.  If you were prescribed any opioid pain medication today such as Norco, Vicodin, Percocet, morphine, hydrocodone, or oxycodone please make sure you do not drive when you are taking this medication as it can alter your ability to drive safely.  If you have any concerns once you are home that you are not improving or are in fact getting worse before you can make it to your follow-up appointment, please do not hesitate to call 911 and come back for further evaluation.  Darel Hong MD  Results for orders placed or performed during the hospital encounter of 81/01/75  Basic metabolic panel  Result Value Ref Range   Sodium 137 135 - 145 mmol/L   Potassium 3.9 3.5 - 5.1 mmol/L   Chloride 103 101 - 111 mmol/L   CO2 25 22 - 32 mmol/L   Glucose, Bld 124 (H) 65 - 99 mg/dL   BUN 15 6 - 20 mg/dL   Creatinine, Ser 0.93 0.44 - 1.00 mg/dL   Calcium 9.2 8.9 - 10.3 mg/dL   GFR calc non Af Amer >60 >60 mL/min   GFR calc Af Amer >60 >60 mL/min   Anion gap 9 5 - 15  CBC  Result Value Ref Range   WBC 7.2 3.6 - 11.0 K/uL   RBC 4.84 3.80 - 5.20 MIL/uL   Hemoglobin 13.7 12.0 - 16.0 g/dL   HCT 40.1 35.0 - 47.0 %   MCV 83.0 80.0 - 100.0 fL   MCH 28.3 26.0 - 34.0 pg   MCHC 34.1 32.0 - 36.0 g/dL   RDW 14.4 11.5 - 14.5 %   Platelets 264 150 - 440 K/uL  Troponin I  Result Value Ref Range   Troponin I <0.03 <0.03 ng/mL  Hepatic function panel  Result Value Ref Range   Total Protein 7.6  6.5 - 8.1 g/dL   Albumin 3.8 3.5 - 5.0 g/dL   AST 29 15 - 41 U/L   ALT 28 14 - 54 U/L   Alkaline Phosphatase 83 38 - 126 U/L   Total Bilirubin 0.7 0.3 - 1.2 mg/dL   Bilirubin, Direct 0.1 0.1 - 0.5 mg/dL   Indirect Bilirubin 0.6 0.3 - 0.9 mg/dL  Brain natriuretic peptide  Result Value Ref Range   B Natriuretic Peptide 215.0 (H) 0.0 - 100.0 pg/mL   Ct Abdomen Pelvis Wo Contrast  Result Date: 08/23/2016 CLINICAL DATA:  Pt states she has been having left flank pain for past 2 days. PT states she has ha a HX of kidney stones PT states pain is on left side. EXAM: CT ABDOMEN AND PELVIS WITHOUT CONTRAST TECHNIQUE: Multidetector CT imaging of the abdomen and pelvis was performed following the standard protocol without IV contrast. COMPARISON:  CT abdomen dated 05/27/2014 and CT abdomen dated 10/01/2004. Also MRI abdomen dated 06/25/2014. FINDINGS: Lower chest: No acute abnormality. Hepatobiliary: No focal liver abnormality is seen. No gallstones, gallbladder wall thickening, or biliary dilatation.  Pancreas: Partially infiltrated with fat but otherwise unremarkable. Spleen: Normal in size without focal abnormality. Adrenals/Urinary Tract: Left adrenal mass, described as a benign adrenal adenoma on earlier MRI abdomen. Right adrenal gland appears normal. Kidneys are unremarkable without mass, stone or hydronephrosis. Small left renal cyst described on earlier CT and MRI is not well seen on this noncontrast exam. No ureteral or bladder calculi identified.  Bladder is decompressed. Stomach/Bowel: Bowel is normal in caliber. Minimal diverticulosis of the descending and sigmoid colon without evidence of acute diverticulitis. Appendix is normal. Stomach is unremarkable, perhaps small hiatal hernia. Vascular/Lymphatic: Aortic atherosclerosis. No enlarged abdominal or pelvic lymph nodes. Reproductive: Uterus and bilateral adnexa are unremarkable. Other: No free fluid or abscess collection. No free intraperitoneal air.  Musculoskeletal: Mild degenerative change within the lumbar and lower thoracic spine. No acute or suspicious osseous finding. IMPRESSION: 1. No acute findings within the abdomen or pelvis. No renal or ureteral calculi. No bowel obstruction or evidence of bowel wall inflammation. No evidence of acute solid organ abnormality. 2. Minimal colonic diverticulosis without evidence of acute diverticulitis. 3. Aortic atherosclerosis. 4. Fairly mild degenerative change within the thoracic and lumbar spine. No more than mild central canal stenosis appreciated at any level and no obvious evidence of a nerve root impingement. No acute or suspicious osseous finding. 5. Small hiatal hernia. Electronically Signed   By: Franki Cabot M.D.   On: 08/23/2016 15:51   Dg Chest 2 View  Result Date: 09/14/2016 CLINICAL DATA:  Left-sided chest pain and shortness of breath which began 45 minutes ago. History of coronary artery disease and previous MI. Former smoker. EXAM: CHEST  2 VIEW COMPARISON:  PA and lateral chest x-ray of [place] 15, 2018 FINDINGS: The lungs are well-expanded. There is no focal infiltrate. The interstitial markings are coarse though stable. The heart and pulmonary vascularity are normal. There is calcification in the wall of the aortic arch. The bony thorax exhibits no acute abnormality. IMPRESSION: Chronic bronchitic-smoking related changes. No definite CHF or alveolar pneumonia. Thoracic aortic atherosclerosis. Electronically Signed   By: David  Martinique M.D.   On: 09/14/2016 16:48   Dg Chest 2 View  Result Date: 08/23/2016 CLINICAL DATA:  left-sided chest pain and shortness of breath. EXAM: CHEST  2 VIEW COMPARISON:  12/27/2015 FINDINGS: Mild cardiac enlargement. No pericardial effusion. Aortic atherosclerosis. No airspace opacities or pulmonary edema. IMPRESSION: 1. No active cardiopulmonary abnormalities. 2.  Aortic Atherosclerosis (ICD10-I70.0).  Cardiac enlargement. Electronically Signed   By: Kerby Moors  M.D.   On: 08/23/2016 16:47   Dg Ribs Unilateral Left  Result Date: 08/23/2016 CLINICAL DATA:  Left lower rib pain.  No trauma. EXAM: LEFT RIBS - 2 VIEW COMPARISON:  December 27, 2015 FINDINGS: Limited views of the left chest are within normal limits. No pneumothorax. No identified fracture or bony lesion. No other acute abnormalities identified. IMPRESSION: Negative. Electronically Signed   By: Dorise Bullion III M.D   On: 08/23/2016 17:48

## 2016-09-14 NOTE — ED Provider Notes (Signed)
[facility] Emergency Department Provider Note  ____________________________________________   First MD Initiated Contact with Patient 09/14/16 1625     (approximate)  I have reviewed the triage vital signs and the nursing notes.   HISTORY  Chief Complaint Chest Pain and Shortness of Breath    HPI Patricia Walker is a 71 y.o. female who self presents to the emergency Department with roughly 1 hour of palpitations and chest discomfort. She says that she thinks she is in atrial fibrillation. It is difficult for her to quantify her pain it is been intermittent but nothing in particular seems to make it come or go. Not associated with shortness of breath. She sleeps on one pillow. No leg swelling. She had a cardiac catheterization performed onMay 31 which is one week ago showing a mid LAD to distal LAD 35% stenosis. During the hospital stay she was cardioverted for atrial fibrillation and discharged with flecainide. She said she initially felt better than she had in a long time with the flecainide, however about an hour prior to arrival she began with chest discomfort and palpitations.   Past Medical History:  Diagnosis Date  . Atrial fibrillation ([place])   . Coronary artery disease   . Hypertension   . MI, old   . Psoriasis     Patient Active Problem List   Diagnosis Date Noted  . Atrial fibrillation ([place]) 09/08/2016    Past Surgical History:  Procedure Laterality Date  . CARDIAC CATHETERIZATION    . CARDIOVERSION Right 09/01/2016   Procedure: Cardioversion;  Surgeon: [name], MD;  Location: [facility] ORS;  Service: Cardiovascular;  Laterality: Right;  . CARDIOVERSION N/A 09/09/2016   Procedure: Cardioversion;  Surgeon: [name], MD;  Location: [facility] ORS;  Service: Cardiovascular;  Laterality: N/A;  . ELECTROPHYSIOLOGIC STUDY N/A 02/01/2016   Procedure: CARDIOVERSION;  Surgeon: [name], MD;  Location: [facility] ORS;  Service: Cardiovascular;   Laterality: N/A;  . ESOPHAGEAL DILATION    . LEFT HEART CATH AND CORONARY ANGIOGRAPHY N/A 09/08/2016   Procedure: Left Heart Cath and Coronary Angiography;  Surgeon: [name], MD;  Location: [place] CV LAB;  Service: Cardiovascular;  Laterality: N/A;  . US ECHOCARDIOGRAPHY      Prior to Admission medications   Medication Sig Start Date End Date Taking? Authorizing Provider  acetaminophen (TYLENOL) 325 MG tablet Take 650 mg by mouth 2 (two) times daily as needed for mild pain or headache.    [provider]  apixaban (ELIQUIS) 5 MG TABS tablet Take 5 mg by mouth 2 (two) times daily.    [provider]  atorvastatin (LIPITOR) 20 MG tablet Take 20 mg by mouth every evening. 08/25/16   [provider]  budesonide-formoterol (SYMBICORT) 160-4.5 MCG/ACT inhaler Inhale 2 puffs into the lungs 2 (two) times daily.    [provider]  calcium carbonate (TUMS - DOSED IN MG ELEMENTAL CALCIUM) 500 MG chewable tablet Chew 500 mg by mouth daily as needed for indigestion or heartburn.     [provider]  cetirizine (ZYRTEC) 10 MG tablet Take 10 mg by mouth daily.    [provider]  Cholecalciferol (VITAMIN D3) 50000 units CAPS Take 50,000 Units by mouth once a week. Monday  08/24/16   [provider]  cyclobenzaprine (FLEXERIL) 10 MG tablet Take 1 tablet (10 mg total) by mouth every 8 (eight) hours as needed for muscle spasms. Patient taking differently: Take 10 mg by mouth 3 (  three) times daily as needed for muscle spasms.  04/03/15   Beers, Pierce Crane, PA-C  esomeprazole (NEXIUM) 40 MG capsule Take 40 mg by mouth daily.     [provider]  furosemide (LASIX) 20 MG tablet Take 20 mg by mouth daily as needed for fluid. 08/24/16   [provider]  Guselkumab (TREMFYA [place]) Inject 1 Dose into the skin every 8 (eight) weeks.    [provider]  levothyroxine (SYNTHROID, LEVOTHROID) 50 MCG tablet Take 50 mcg by mouth  daily before breakfast.    [provider]  neomycin-polymyxin-hydrocortisone (CORTISPORIN) otic solution Place 4 drops into the right ear 4 (four) times daily as needed (psoriasis in ear).    [provider]  nystatin (NYSTATIN) powder Apply topically 4 (four) times daily as needed. Irritation under breast and groin area    [provider]  TREMFYA 100 MG/ML SOSY Inject 100 mg into the skin every 28 (twenty-eight) days. 09/02/16   [provider]    Allergies Other  Family History  Problem Relation Age of Onset  . Breast cancer Paternal Aunt   . Other Father     Social History Social History  Substance Use Topics  . Smoking status: Former Smoker    Types: Cigarettes    Quit date: 02/01/2013  . Smokeless tobacco: Never Used  . Alcohol use No    Review of Systems Constitutional: No fever/chills Eyes: No visual changes. ENT: No sore throat. Cardiovascular: Positive chest pain. Respiratory: Denies shortness of breath. Gastrointestinal: No abdominal pain.  No nausea, no vomiting.  No diarrhea.  No constipation. Genitourinary: Negative for dysuria. Musculoskeletal: Negative for back pain. Skin: Negative for rash. Neurological: Negative for headaches, focal weakness or numbness.   ____________________________________________   PHYSICAL EXAM:  VITAL SIGNS: ED Triage Vitals  Enc Vitals Group     BP 09/14/16 1615 (!) 147/84     Pulse Rate 09/14/16 1615 76     Resp 09/14/16 1615 (!) 24     Temp 09/14/16 1615 98.4 F (36.9 C)     Temp Source 09/14/16 1615 Oral     SpO2 09/14/16 1615 91 %     Weight 09/14/16 1612 300 lb (136.1 kg)     Height 09/14/16 1612 5\' 4"  (1.626 m)     Head Circumference --      Peak Flow --      Pain Score 09/14/16 1612 6     Pain Loc --      Pain Edu? --      Excl. in [place]? --     Constitutional: Alert and oriented x 4 well appearing nontoxic no diaphoresis speaks in full, clear sentences Eyes: PERRL  EOMI. Head: Atraumatic. Nose: No congestion/rhinnorhea. Mouth/Throat: No trismus Neck: No stridor.   Cardiovascular: Irregularly irregular although normal rate able to lie completely flat with no jugular venous distention Respiratory: Slightly increased respiratory effort.  No retractions. Lungs CTAB and moving good air Gastrointestinal: Morbidly obese nontender Musculoskeletal: Legs are equal in size   Neurologic:  Normal speech and language. No gross focal neurologic deficits are appreciated. Skin:  Skin is warm, dry and intact. No rash noted. Psychiatric: Mood and affect are normal. Speech and behavior are normal.    ____________________________________________   DIFFERENTIAL  Acute coronary syndrome, metabolic derangement, flecainide reaction, atrial fibrillation ____________________________________________   LABS (all labs ordered are listed, but only abnormal results are displayed)  Labs Reviewed  BASIC METABOLIC PANEL - Abnormal; Notable for the  following:       Result Value   Glucose, Bld 124 (*)    All other components within normal limits  BRAIN NATRIURETIC PEPTIDE - Abnormal; Notable for the following:    B Natriuretic Peptide 215.0 (*)    All other components within normal limits  CBC  TROPONIN I  HEPATIC FUNCTION PANEL    No signs of acute ischemia __________________________________________  EKG  ED ECG REPORT I, Darel Hong, the attending physician, personally viewed and interpreted this ECG.  Date: 09/14/2016 EKG Time: 1612 Rate: 86 Rhythm: Atrial fibrillation QRS Axis: normal Intervals: normal ST/T Wave abnormalities: normal Conduction Disturbances: Left bundle branch block Narrative Interpretation: Abnormal. EKG performed on [place] 31 shows an incomplete left bundle branch block. EKG performed on [place] 24 is sinus bradycardia with normal QRS complex  ____________________________________________  RADIOLOGY  Chest x-ray with no acute disease  but does show chronic changes ____________________________________________   PROCEDURES  Procedure(s) performed: no  Procedures  Critical Care performed: no  Observation: no ____________________________________________   INITIAL IMPRESSION / ASSESSMENT AND PLAN / ED COURSE  Pertinent labs & imaging results that were available during my care of the patient were reviewed by me and considered in my medical decision making (see chart for details).  The patient arrives very well-appearing although in atrial fibrillation and her EKG is borderline left bundle branch which she never was before. She did have a recent catheterization which showed multiple 35% lesions however her symptoms are not consistent with acute coronary syndrome. Her cardiologist is Dr. [name] who is on vacation so I will reach out to Dr. Clayborn Bigness who is on-call for her.  Dr. Clayborn Bigness indicated that a widening QRS is a reaction to the fleck at night and she is to stop it immediately. Understandably upset as she is currently failed 3 medications for atrial fibrillation as well as cardioversion. I observed her for over 2 hours in the department and she never went into rapid ventricular response. Her labs are reassuring. At this point she is medically stable for outpatient management and she will follow up with Dr. Clayborn Bigness within 1 week for a recheck.  Strict return to the ED precautions given.  ____________________________________________   FINAL CLINICAL IMPRESSION(S) / ED DIAGNOSES  Final diagnoses:  Atrial fibrillation, unspecified type ([place])  Chest discomfort      NEW MEDICATIONS STARTED DURING THIS VISIT:  Discharge Medication List as of 09/14/2016  6:31 PM       Note:  This document was prepared using Dragon voice recognition software and [place] include unintentional dictation errors.     Darel Hong, MD 09/15/16 [phone]

## 2016-09-14 NOTE — ED Notes (Signed)
Patient ambulatory to restroom without assistance. Increased RR noted upon return to bed. Patient reconnected to monitor. NAD noted at this time. Will continue to monitor.

## 2016-09-14 NOTE — ED Triage Notes (Addendum)
PT presents to ER c/o chest pressure to left side 30 minutes PTA. PT reports SOB. PT was recently discharged after "being shocked" twice due to low HR and irregularity. Pt alert and oriented X4, active, cooperative. Pt looks mildly pale. RR mildly increased.

## 2016-09-16 DIAGNOSIS — I2510 Atherosclerotic heart disease of native coronary artery without angina pectoris: Secondary | ICD-10-CM | POA: Insufficient documentation

## 2016-09-16 DIAGNOSIS — Z9889 Other specified postprocedural states: Secondary | ICD-10-CM | POA: Insufficient documentation

## 2016-09-16 DIAGNOSIS — E669 Obesity, unspecified: Secondary | ICD-10-CM | POA: Insufficient documentation

## 2016-09-16 DIAGNOSIS — E785 Hyperlipidemia, unspecified: Secondary | ICD-10-CM | POA: Insufficient documentation

## 2016-09-16 DIAGNOSIS — Z9289 Personal history of other medical treatment: Secondary | ICD-10-CM | POA: Insufficient documentation

## 2016-09-16 DIAGNOSIS — I10 Essential (primary) hypertension: Secondary | ICD-10-CM | POA: Insufficient documentation

## 2016-09-16 DIAGNOSIS — I25118 Atherosclerotic heart disease of native coronary artery with other forms of angina pectoris: Secondary | ICD-10-CM | POA: Insufficient documentation

## 2016-10-03 ENCOUNTER — Ambulatory Visit (INDEPENDENT_AMBULATORY_CARE_PROVIDER_SITE_OTHER): Payer: Medicare HMO | Admitting: Internal Medicine

## 2016-10-03 VITALS — BP 118/80 | HR 67 | Ht 64.0 in | Wt 295.6 lb

## 2016-10-03 DIAGNOSIS — I4891 Unspecified atrial fibrillation: Secondary | ICD-10-CM

## 2016-10-03 DIAGNOSIS — I481 Persistent atrial fibrillation: Secondary | ICD-10-CM

## 2016-10-03 DIAGNOSIS — G4733 Obstructive sleep apnea (adult) (pediatric): Secondary | ICD-10-CM | POA: Diagnosis not present

## 2016-10-03 DIAGNOSIS — I4819 Other persistent atrial fibrillation: Secondary | ICD-10-CM

## 2016-10-03 DIAGNOSIS — I10 Essential (primary) hypertension: Secondary | ICD-10-CM

## 2016-10-03 NOTE — Patient Instructions (Addendum)
Medication Instructions:  Your physician recommends that you continue on your current medications as directed. Please refer to the Current Medication list given to you today.  Labwork: None ordered.  Testing/Procedures: None ordered.  Follow-Up: Your physician recommends that you schedule a follow-up appointment as needed.   Any Other Special Instructions Will Be Listed Below (If Applicable).     If you need a refill on your cardiac medications before your next appointment, please call your pharmacy.   

## 2016-10-03 NOTE — Progress Notes (Signed)
Electrophysiology Office Note   Date:  10/03/2016   ID:  Patricia Walker, DOB 1946/03/10, MRN [id number]  PCP:  Perrin Maltese, MD  Cardiologist:  Dr [name] Primary Electrophysiologist: Thompson Grayer, MD    Chief Complaint  Patient presents with  . New Patient (Initial Visit)    new afib/ to discuss ablation     History of Present Illness: Patricia Walker is a 71 y.o. female who presents today for electrophysiology evaluation.   The patient has medicine refractory atrial fibrillation in the setting of OSA and morbid obesity.  She is referred for consideration of ablation.   She has had persistent afib since at least 2014 upon review of epic records.  She has failed medical therapy with amiodarone, sotalol, and more recently flecainide.  She has rate controlled afib and has bradycardia with addition of rate controlling medicines.  She has fatigue and decreased exercise tolerance with her afib.  Recent cath is reviewed which reveals mild nonobstructive cAD.  Today, she denies symptoms of palpitations, chest pain, shortness of breath, orthopnea, PND, lower extremity edema, claudication, dizziness, presyncope, syncope, bleeding, or neurologic sequela. The patient is tolerating medications without difficulties and is otherwise without complaint today.    Past Medical History:  Diagnosis Date  . Coronary artery disease    mild, nonobstructive  . GERD (gastroesophageal reflux disease)   . Hyperlipidemia   . Hypertension   . 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 Morbid obesity ([place])   . Persistent atrial fibrillation ([place])   . Psoriasis   . Sleep apnea    compliant with CPAP  . Thyroid disease    Past Surgical History:  Procedure Laterality Date  . CARDIAC CATHETERIZATION    . CARDIOVERSION Right 09/01/2016   Procedure: Cardioversion;  Surgeon: [name], MD;  Location: [facility];  Service: Cardiovascular;  Laterality: Right;  . CARDIOVERSION N/A 09/09/2016   Procedure:  Cardioversion;  Surgeon: [name], MD;  Location: [facility];  Service: Cardiovascular;  Laterality: N/A;  . ELECTROPHYSIOLOGIC STUDY N/A 02/01/2016   Procedure: CARDIOVERSION;  Surgeon: [name], MD;  Location: [facility];  Service: Cardiovascular;  Laterality: N/A;  . ESOPHAGEAL DILATION    . LEFT HEART CATH AND CORONARY ANGIOGRAPHY N/A 09/08/2016   Procedure: Left Heart Cath and Coronary Angiography;  Surgeon: [name], MD;  Location: [place] [facility];  Service: Cardiovascular;  Laterality: N/A;  . US ECHOCARDIOGRAPHY       Current Outpatient Prescriptions  Medication Sig Dispense Refill  . acetaminophen (TYLENOL) 325 MG tablet Take 650 mg by mouth 2 (two) times daily as needed for mild pain or headache.    [name] Kitchen apixaban (ELIQUIS) 5 MG TABS tablet Take 5 mg by mouth 2 (two) times daily.    [name] Kitchen atorvastatin (LIPITOR) 20 MG tablet Take 20 mg by mouth every evening.  1  . budesonide-formoterol (SYMBICORT) 160-4.5 MCG/ACT inhaler Inhale 2 puffs into the lungs 2 (two) times daily.    . calcium carbonate (TUMS - DOSED IN MG ELEMENTAL CALCIUM) 500 MG chewable tablet Chew 500 mg by mouth daily as needed for indigestion or heartburn.     . cetirizine (ZYRTEC) 10 MG tablet Take 10 mg by mouth daily.    . Cholecalciferol (VITAMIN D3) 50000 units CAPS Take 50,000 Units by mouth once a week. Monday   1  . cyclobenzaprine (FLEXERIL) 10 MG tablet Take 10 mg by mouth 3 (three) times daily  as needed for muscle spasms.    [name] Kitchen diltiazem (CARDIZEM SR) 60 MG 12 hr capsule Take 60 mg by mouth daily.  1  . esomeprazole (NEXIUM) 40 MG capsule Take 40 mg by mouth daily.     . furosemide (LASIX) 20 MG tablet Take 20 mg by mouth daily as needed for fluid.  2  . Guselkumab (TREMFYA [place]) Inject 1 Dose into the skin every 8 (eight) weeks.    [name] Kitchen ipratropium (ATROVENT) 0.06 % nasal spray Place 2 sprays into the nose as directed.    [name] Kitchen levothyroxine (SYNTHROID, LEVOTHROID) 50 MCG tablet Take 50 mcg by mouth  daily before breakfast.    . neomycin-polymyxin-hydrocortisone (CORTISPORIN) otic solution Place 4 drops into the right ear 4 (four) times daily as needed (psoriasis in ear).    . nystatin (NYSTATIN) powder Apply topically 4 (four) times daily as needed. Irritation under breast and groin area    . TREMFYA 100 MG/ML SOSY Inject 100 mg into the skin every 28 (twenty-eight) days.     No current facility-administered medications for this visit.     Allergies:   Other   Social History:  The patient  reports that she quit smoking about 3 years ago. Her smoking use included Cigarettes. She has never used smokeless tobacco. She reports that she does not drink alcohol or use drugs.   Family History:  The patient's family history includes Breast cancer in her paternal aunt; Heart attack in her father; Other in her father.   ROS:  Please see the history of present illness.   All other systems are personally reviewed and negative.    PHYSICAL EXAM: VS:  BP 118/80   Pulse 67   Ht 5\' 4"  (1.626 m)   Wt 295 lb 9.6 oz (134.1 kg)   BMI 50.74 kg/m  , BMI Body mass index is 50.74 kg/m. GEN: morbidly obese, in no acute distress  HEENT: normal  Neck: no JVD, carotid bruits, or masses Cardiac: iRRR; no murmurs, rubs, or gallops,no edema  Respiratory:  clear to auscultation bilaterally, normal work of breathing GI: soft, nontender, nondistended, + BS MS: no deformity or atrophy  Skin: warm and dry  Neuro:  Strength and sensation are intact Psych: euthymic mood, full affect  EKG:  EKG is ordered today. The ekg ordered today is personally reviewed and shows afib,  V rate 67 bpm,  LVH, QTc 454 msec   Recent Labs: 09/11/2016: Magnesium 1.9 09/14/2016: ALT 28; B Natriuretic Peptide 215.0; BUN 15; Creatinine, Ser 0.93; Hemoglobin 13.7; Platelets 264; Potassium 3.9; Sodium 137  personally reviewed   Lipid Panel     Component Value Date/Time   CHOL 190 05/18/2012 0928   TRIG 138 05/18/2012 0928   HDL  35 (L) 05/18/2012 0928   VLDL 28 05/18/2012 0928   LDLCALC 127 (H) 05/18/2012 0928   personally reviewed   Wt Readings from Last 3 Encounters:  10/03/16 295 lb 9.6 oz (134.1 kg)  09/14/16 300 lb (136.1 kg)  09/11/16 (!) 300 lb 9.6 oz (136.4 kg)      Other studies personally reviewed: Additional studies/ records that were reviewed today include: hospital records from [facility]  Review of the above records today demonstrates: as above   ASSESSMENT AND PLAN:  1.  Persistent afib The patient has medicine refractory atrial fibrillation in the setting of OSA and morbid obesity.  She is referred for consideration of ablation.  Given her refractory afib and obesity, she is a poor candidate  for ablation.  Anticipated success rates are probably 60% with 1/3 patients requiring repeat procedures.  Risks of the procedure were discussed at length today.  Currently, she would prefer to avoid ablation.  She will work on lifestyle modification including regular exercise and weight loss.   As her V rates are controlled, I do not feel that AV nodal ablation would offer her benefit. [email or web address] score is at least 3.  Continue eliquis long term  2. OSA Importance of compliance with CPAP discussed with the patient  3. Morbid obesity Body mass index is 50.74 kg/m. Lifestyle modification encouraged Bariatric surgical consultation also advised.  She will discuss this with Dr [name].  4. HTN Stable No change required today   Follow-up with Dr [name] as scheduled I will see as needed going forward  Current medicines are reviewed at length with the patient today.   The patient does not have concerns regarding her medicines.  The following changes were made today:  none  Signed, Thompson Grayer, MD  10/03/2016 8:43 AM     [facility] [address] [place] [address] [phone] (office) [phone] (fax)

## 2016-11-17 ENCOUNTER — Telehealth: Payer: Self-pay

## 2016-11-17 ENCOUNTER — Encounter: Payer: Self-pay | Admitting: Internal Medicine

## 2016-11-17 ENCOUNTER — Ambulatory Visit (INDEPENDENT_AMBULATORY_CARE_PROVIDER_SITE_OTHER): Payer: Medicare HMO | Admitting: Gastroenterology

## 2016-11-17 ENCOUNTER — Encounter: Payer: Self-pay | Admitting: Gastroenterology

## 2016-11-17 ENCOUNTER — Other Ambulatory Visit: Payer: Self-pay

## 2016-11-17 VITALS — BP 126/83 | HR 71 | Temp 98.2°F | Wt 302.6 lb

## 2016-11-17 DIAGNOSIS — R1013 Epigastric pain: Secondary | ICD-10-CM

## 2016-11-17 DIAGNOSIS — M1710 Unilateral primary osteoarthritis, unspecified knee: Secondary | ICD-10-CM | POA: Insufficient documentation

## 2016-11-17 DIAGNOSIS — M179 Osteoarthritis of knee, unspecified: Secondary | ICD-10-CM | POA: Insufficient documentation

## 2016-11-17 MED ORDER — SUCRALFATE 1 GM/10ML PO SUSP: 1.0000 g | Freq: Four times a day (QID) | ORAL | 1 refills | Status: DC

## 2016-11-17 NOTE — Telephone Encounter (Signed)
Patient called from [place] she said that she had the H-Pylori stool test at Dr. Trish Mage office.  I contacted the office and they will fax over the results of the h-pylori.

## 2016-11-17 NOTE — Progress Notes (Signed)
[name] MD, MRCP(U.K) [address]  [place]  [place], [place] [address]  Main: [phone]  Fax: [phone]   Gastroenterology Consultation  Referring Provider:     [place] Primary Care Physician:  Perrin Maltese, MD Primary Gastroenterologist:  Dr. [name]  Reason for Consultation:     Abdominal pain         HPI:   Patricia Walker is a 70 y.o. y/o female referred for consultation & management  by Dr. Perrin Maltese, MD.     She has been referred for abdominal pain ,nausea and vomiting.  Labs 09/14/16 - LFT,CBC-normal   Abdominal pain: Onset:  Few months , on and off, no change  Site :Epigastric  Radiation: sometimes lower abdomen and side  Severity :at times its a 7/10  Nature of pain: cramping  Aggravating factors: nothing , not related to meals  Relieving factors :tylenol  Weight loss: no change  NSAID use: no  PPI use :yes , daily , does not help the pain  Gall bladder surgery: intact  Frequency of bowel movements: usually every morning  Change in bowel movements: sometimes a bit more hard  Relief with bowel movements: no  Gas/Bloating/Abdominal distension: no   Past Medical History:  Diagnosis Date  . Coronary artery disease    mild, nonobstructive  . GERD (gastroesophageal reflux disease)   . Hyperlipidemia   . Hypertension   . 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 Morbid obesity ([place])   . Persistent atrial fibrillation ([place])   . Psoriasis   . Sleep apnea    compliant with CPAP  . Thyroid disease     Past Surgical History:  Procedure Laterality Date  . CARDIAC CATHETERIZATION    . CARDIOVERSION Right 09/01/2016   Procedure: Cardioversion;  Surgeon: [name], MD;  Location: [facility];  Service: Cardiovascular;  Laterality: Right;  . CARDIOVERSION N/A 09/09/2016   Procedure: Cardioversion;  Surgeon: [name], MD;  Location: [facility];  Service: Cardiovascular;  Laterality: N/A;  . ELECTROPHYSIOLOGIC STUDY N/A  02/01/2016   Procedure: CARDIOVERSION;  Surgeon: [name], MD;  Location: [facility];  Service: Cardiovascular;  Laterality: N/A;  . ESOPHAGEAL DILATION    . LEFT HEART CATH AND CORONARY ANGIOGRAPHY N/A 09/08/2016   Procedure: Left Heart Cath and Coronary Angiography;  Surgeon: [name], MD;  Location: [place] [facility];  Service: Cardiovascular;  Laterality: N/A;  . US ECHOCARDIOGRAPHY      Prior to Admission medications   Medication Sig Start Date End Date Taking? Authorizing Provider  acetaminophen (TYLENOL) 325 MG tablet Take 650 mg by mouth 2 (two) times daily as needed for mild pain or headache.    [provider]  apixaban (ELIQUIS) 5 MG TABS tablet Take 5 mg by mouth 2 (two) times daily.    [provider]  ASPIRIN LOW DOSE 81 MG tablet  11/12/16   [provider]  atorvastatin (LIPITOR) 20 MG tablet Take 20 mg by mouth every evening. 08/25/16   [provider]  azelastine (ASTELIN) 0.1 % nasal spray  10/13/16   [provider]  budesonide-formoterol (SYMBICORT) 160-4.5 MCG/ACT inhaler Inhale 2 puffs into the lungs 2 (two) times daily.    [provider]  calcium carbonate (TUMS - DOSED IN MG ELEMENTAL CALCIUM) 500 MG chewable tablet Chew 500 mg by mouth daily as needed for indigestion or heartburn.     [provider]  cetirizine (ZYRTEC) 10 MG tablet Take 10 mg by mouth daily.    [provider]  Cholecalciferol (VITAMIN D3) 50000 units CAPS Take 50,000 Units by mouth once a week. Monday  08/24/16   [provider]  clobetasol cream (TEMOVATE) 0.05 %  11/15/16   [provider]  cyclobenzaprine (FLEXERIL) 10 MG tablet Take 10 mg by mouth 3 (three) times daily as needed for muscle spasms.    [provider]  diltiazem (CARDIZEM SR) 60 MG 12 hr capsule Take 60 mg by mouth daily. 09/21/16   [provider]  esomeprazole (NEXIUM) 40 MG capsule Take 40 mg by mouth daily.      [provider]  fluocinolone (SYNALAR) 0.01 % external solution  10/27/16   [provider]  furosemide (LASIX) 20 MG tablet Take 20 mg by mouth daily as needed for fluid. 08/24/16   [provider]  Guselkumab (TREMFYA [place]) Inject 1 Dose into the skin every 8 (eight) weeks.    [provider]  hydrOXYzine (ATARAX/VISTARIL) 25 MG tablet  11/03/16   [provider]  ipratropium (ATROVENT) 0.06 % nasal spray Place 2 sprays into the nose as directed.    [provider]  levothyroxine (SYNTHROID, LEVOTHROID) 50 MCG tablet Take 50 mcg by mouth daily before breakfast.    [provider]  neomycin-polymyxin-hydrocortisone (CORTISPORIN) otic solution Place 4 drops into the right ear 4 (four) times daily as needed (psoriasis in ear).    [provider]  nystatin (NYSTATIN) powder Apply topically 4 (four) times daily as needed. Irritation under breast and groin area    [provider]  TREMFYA 100 MG/ML SOSY Inject 100 mg into the skin every 28 (twenty-eight) days. 09/02/16   [provider]    Family History  Problem Relation Age of Onset  . Breast cancer Paternal Aunt   . Other Father   . Heart attack Father      Social History  Substance Use Topics  . Smoking status: Former Smoker    Types: Cigarettes    Quit date: 02/01/2013  . Smokeless tobacco: Never Used  . Alcohol use No    Allergies as of 11/17/2016 - Review Complete 10/03/2016  Allergen Reaction Noted  . Other Itching 10/23/2015    Review of Systems:    All systems reviewed and negative except where noted in HPI.   Physical Exam:  There were no vitals taken for this visit. No LMP recorded. Patient is postmenopausal. Psych:  Alert and cooperative. Normal mood and affect. General:   Alert,  Well-developed, well-nourished, pleasant and cooperative in NAD Head:  Normocephalic and atraumatic. Eyes:  Sclera clear, no icterus.   Conjunctiva  pink. Ears:  Normal auditory acuity. Nose:  No deformity, discharge, or lesions. Mouth:  No deformity or lesions,oropharynx pink & moist. Neck:  Supple; no masses or thyromegaly. Lungs:  Respirations even and unlabored.  Clear throughout to auscultation.   No wheezes, crackles, or rhonchi. No acute distress. Heart:  Regular rate and rhythm; no murmurs, clicks, rubs, or gallops. Abdomen:  Normal bowel sounds.  No bruits.  Soft, non-tender and non-distended without masses, hepatosplenomegaly or hernias noted.  No guarding or rebound tenderness.    Msk:  Symmetrical without gross deformities. Good, equal movement & strength bilaterally. Pulses:  Normal pulses noted. Extremities:  No clubbing or edema.  No cyanosis. Neurologic:  Alert and oriented x3;  grossly normal neurologically. Skin:  Intact without significant lesions or  rashes. No jaundice. Lymph Nodes:  No significant cervical adenopathy. Psych:  Alert and cooperative. Normal mood and affect.  Imaging Studies: No results found.  Assessment and Plan:   Patricia Walker is a 71 y.o. y/o female has been referred for epigastric abdominal pain not relieved with a PPI. Does have ealry morning nausea. Pain [place] be related to reflux.    Plan  1. Ct scan of the abdomen and pelvis with IV contrast  2. EGD 3. She will need cardiac clearance as she is on Eloquis.  4. Carafate QID PRN  I have discussed alternative options, risks & benefits,  which include, but are not limited to, bleeding, infection, perforation,respiratory complication & drug reaction.  The patient agrees with this plan & written consent will be obtained.     Follow up in 8-10 weeks  Dr [name] MD,MRCP(U.K)

## 2016-11-24 ENCOUNTER — Ambulatory Visit: Payer: Medicare HMO

## 2016-12-22 ENCOUNTER — Telehealth: Payer: Self-pay

## 2016-12-22 NOTE — Telephone Encounter (Signed)
Resent EGD procedure letter.   Changed procedure date from 9/27 to 10/22 per pt request.

## 2017-01-16 ENCOUNTER — Telehealth: Payer: Self-pay | Admitting: Gastroenterology

## 2017-01-16 NOTE — Telephone Encounter (Signed)
Please call to reschedule.

## 2017-01-19 ENCOUNTER — Telehealth: Payer: Self-pay

## 2017-01-19 NOTE — Telephone Encounter (Signed)
Returned patient's call to reschedule.   No answer and no vm.

## 2017-01-30 ENCOUNTER — Ambulatory Visit: Payer: Medicare HMO | Admitting: Gastroenterology

## 2017-02-03 ENCOUNTER — Telehealth: Payer: Self-pay

## 2017-02-03 NOTE — Telephone Encounter (Signed)
Received medication clearance from Dr. [name] to stop Eliquis.  Stop 3 days prior Restart 2 days following.   Patient has been advised and has a copy of the request clearance from Dr. Laurelyn Sickle office.

## 2017-02-06 ENCOUNTER — Ambulatory Visit: Payer: Medicare HMO | Admitting: Anesthesiology

## 2017-02-06 ENCOUNTER — Ambulatory Visit
Admission: RE | Admit: 2017-02-06 | Discharge: 2017-02-06 | Disposition: A | Discharge status: Home / Self Care | Payer: Medicare HMO | Source: Ambulatory Visit | Attending: Gastroenterology | Admitting: Gastroenterology

## 2017-02-06 ENCOUNTER — Encounter: Payer: Self-pay | Admitting: *Deleted

## 2017-02-06 ENCOUNTER — Encounter
Admission: RE | Disposition: A | Discharge status: Home / Self Care | Payer: Self-pay | Source: Ambulatory Visit | Attending: Gastroenterology

## 2017-02-06 DIAGNOSIS — Z7901 Long term (current) use of anticoagulants: Secondary | ICD-10-CM | POA: Insufficient documentation

## 2017-02-06 DIAGNOSIS — I10 Essential (primary) hypertension: Secondary | ICD-10-CM | POA: Insufficient documentation

## 2017-02-06 DIAGNOSIS — I481 Persistent atrial fibrillation: Secondary | ICD-10-CM | POA: Diagnosis not present

## 2017-02-06 DIAGNOSIS — E785 Hyperlipidemia, unspecified: Secondary | ICD-10-CM | POA: Diagnosis not present

## 2017-02-06 DIAGNOSIS — G4730 Sleep apnea, unspecified: Secondary | ICD-10-CM | POA: Diagnosis not present

## 2017-02-06 DIAGNOSIS — Z79899 Other long term (current) drug therapy: Secondary | ICD-10-CM | POA: Diagnosis not present

## 2017-02-06 DIAGNOSIS — K449 Diaphragmatic hernia without obstruction or gangrene: Secondary | ICD-10-CM | POA: Diagnosis not present

## 2017-02-06 DIAGNOSIS — Z6841 Body Mass Index (BMI) 40.0 and over, adult: Secondary | ICD-10-CM | POA: Insufficient documentation

## 2017-02-06 DIAGNOSIS — K210 Gastro-esophageal reflux disease with esophagitis: Secondary | ICD-10-CM | POA: Diagnosis not present

## 2017-02-06 DIAGNOSIS — I2510 Atherosclerotic heart disease of native coronary artery without angina pectoris: Secondary | ICD-10-CM | POA: Insufficient documentation

## 2017-02-06 DIAGNOSIS — K2950 Unspecified chronic gastritis without bleeding: Secondary | ICD-10-CM | POA: Insufficient documentation

## 2017-02-06 DIAGNOSIS — R1013 Epigastric pain: Secondary | ICD-10-CM | POA: Insufficient documentation

## 2017-02-06 DIAGNOSIS — J449 Chronic obstructive pulmonary disease, unspecified: Secondary | ICD-10-CM | POA: Insufficient documentation

## 2017-02-06 DIAGNOSIS — I252 Old myocardial infarction: Secondary | ICD-10-CM | POA: Diagnosis not present

## 2017-02-06 DIAGNOSIS — Z7982 Long term (current) use of aspirin: Secondary | ICD-10-CM | POA: Insufficient documentation

## 2017-02-06 DIAGNOSIS — Z87891 Personal history of nicotine dependence: Secondary | ICD-10-CM | POA: Diagnosis not present

## 2017-02-06 DIAGNOSIS — E079 Disorder of thyroid, unspecified: Secondary | ICD-10-CM | POA: Insufficient documentation

## 2017-02-06 HISTORY — DX: Cardiac arrhythmia, unspecified: I49.9

## 2017-02-06 HISTORY — PX: ESOPHAGOGASTRODUODENOSCOPY (EGD) WITH PROPOFOL: SHX5813

## 2017-02-06 SURGERY — ESOPHAGOGASTRODUODENOSCOPY (EGD) WITH PROPOFOL
Anesthesia: General

## 2017-02-06 MED ORDER — FENTANYL CITRATE (PF) 100 MCG/2ML IJ SOLN
INTRAMUSCULAR | Status: DC | PRN
  Administered 2017-02-06: 50 ug via INTRAVENOUS

## 2017-02-06 MED ORDER — LIDOCAINE HCL (PF) 2 % IJ SOLN
INTRAMUSCULAR | Status: AC
  Filled 2017-02-06: qty 10

## 2017-02-06 MED ORDER — SODIUM CHLORIDE 0.9 % IV SOLN
INTRAVENOUS | Status: DC
  Administered 2017-02-06: 09:00:00 via INTRAVENOUS

## 2017-02-06 MED ORDER — FENTANYL CITRATE (PF) 100 MCG/2ML IJ SOLN
INTRAMUSCULAR | Status: AC
  Filled 2017-02-06: qty 2

## 2017-02-06 MED ORDER — PROPOFOL 10 MG/ML IV BOLUS
INTRAVENOUS | Status: DC | PRN
  Administered 2017-02-06: 20 mg via INTRAVENOUS
  Administered 2017-02-06: 30 mg via INTRAVENOUS
  Administered 2017-02-06 (×2): 20 mg via INTRAVENOUS
  Administered 2017-02-06: 50 mg via INTRAVENOUS

## 2017-02-06 MED ORDER — PROPOFOL 10 MG/ML IV BOLUS
INTRAVENOUS | Status: AC
  Filled 2017-02-06: qty 20

## 2017-02-06 NOTE — Anesthesia Post-op Follow-up Note (Signed)
Anesthesia QCDR form completed.        

## 2017-02-06 NOTE — Anesthesia Preprocedure Evaluation (Signed)
Anesthesia Evaluation  Patient identified by MRN, date of birth, ID band Patient awake    Reviewed: Allergy & Precautions, NPO status , Patient's Chart, lab work & pertinent test results  Airway Mallampati: II       Dental  (+) Upper Dentures, Partial Lower   Pulmonary sleep apnea , COPD, former smoker,     + decreased breath sounds      Cardiovascular Exercise Tolerance: Good hypertension, Pt. on medications + CAD and + Past MI  + dysrhythmias Atrial Fibrillation  Rhythm:Irregular Rate:Abnormal     Neuro/Psych    GI/Hepatic Neg liver ROS, GERD  Medicated,  Endo/Other  negative endocrine ROS  Renal/GU negative Renal ROS     Musculoskeletal   Abdominal (+) + obese,   Peds negative pediatric ROS (+)  Hematology negative hematology ROS (+)   Anesthesia Other Findings   Reproductive/Obstetrics                             Anesthesia Physical Anesthesia Plan  ASA: III  Anesthesia Plan: General   Post-op Pain Management:    Induction: Intravenous  PONV Risk Score and Plan: 0  Airway Management Planned: Natural Airway and Nasal Cannula  Additional Equipment:   Intra-op Plan:   Post-operative Plan:   Informed Consent: I have reviewed the patients History and Physical, chart, labs and discussed the procedure including the risks, benefits and alternatives for the proposed anesthesia with the patient or authorized representative who has indicated his/her understanding and acceptance.     Plan Discussed with: CRNA  Anesthesia Plan Comments:         Anesthesia Quick Evaluation

## 2017-02-06 NOTE — Anesthesia Postprocedure Evaluation (Signed)
Anesthesia Post Note  Patient: Patricia Walker  Procedure(s) Performed: ESOPHAGOGASTRODUODENOSCOPY (EGD) WITH PROPOFOL (N/A )  Patient location during evaluation: PACU Anesthesia Type: General Level of consciousness: awake Pain management: pain level controlled Vital Signs Assessment: post-procedure vital signs reviewed and stable Cardiovascular status: stable Anesthetic complications: no     Last Vitals:  Vitals:   02/06/17 0950 02/06/17 1000  BP: (!) 160/103 (!) 144/97  Pulse: (!) 51 60  Resp: 17 13  Temp:    SpO2: 95% 98%    Last Pain:  Vitals:   02/06/17 0930  TempSrc: Tympanic                 VAN STAVEREN,Malayah Demuro

## 2017-02-06 NOTE — H&P (Signed)
[name], MD [address] [place], [place], [place], [address] [address], [place], [place], [place], [address] Phone: [phone]  Fax: [phone]  Primary Care Physician:  Perrin Maltese, MD   Pre-Procedure History & Physical: HPI:  Patricia Walker is a 71 y.o. female is here for a endoscopy    Past Medical History:  Diagnosis Date  . Coronary artery disease    mild, nonobstructive  . Dysrhythmia    Atrial Fibrillation  . GERD (gastroesophageal reflux disease)   . Hyperlipidemia   . Hypertension   . 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 Morbid obesity ([place])   . Persistent atrial fibrillation ([place])   . Psoriasis   . Sleep apnea    compliant with CPAP  . Thyroid disease     Past Surgical History:  Procedure Laterality Date  . CARDIAC CATHETERIZATION    . CARDIOVERSION Right 09/01/2016   Procedure: Cardioversion;  Surgeon: [name], MD;  Location: [facility];  Service: Cardiovascular;  Laterality: Right;  . CARDIOVERSION N/A 09/09/2016   Procedure: Cardioversion;  Surgeon: [name], MD;  Location: [facility];  Service: Cardiovascular;  Laterality: N/A;  . ELECTROPHYSIOLOGIC STUDY N/A 02/01/2016   Procedure: CARDIOVERSION;  Surgeon: [name], MD;  Location: [facility];  Service: Cardiovascular;  Laterality: N/A;  . ESOPHAGEAL DILATION    . LEFT HEART CATH AND CORONARY ANGIOGRAPHY N/A 09/08/2016   Procedure: Left Heart Cath and Coronary Angiography;  Surgeon: [name], MD;  Location: [place] [facility];  Service: Cardiovascular;  Laterality: N/A;  . US ECHOCARDIOGRAPHY      Prior to Admission medications   Medication Sig Start Date End Date Taking? Authorizing Provider  atorvastatin (LIPITOR) 20 MG tablet Take 20 mg by mouth every evening. 08/25/16  Yes [provider]  budesonide-formoterol (SYMBICORT) 160-4.5 MCG/ACT inhaler Inhale 2 puffs into the lungs 2 (two) times daily.   Yes [provider]  calcium carbonate  (TUMS - DOSED IN MG ELEMENTAL CALCIUM) 500 MG chewable tablet Chew 500 mg by mouth daily as needed for indigestion or heartburn.    Yes [provider]  cetirizine (ZYRTEC) 10 MG tablet Take 10 mg by mouth daily.   Yes [provider]  Cholecalciferol (VITAMIN D3) 50000 units CAPS Take 50,000 Units by mouth once a week. Monday  08/24/16  Yes [provider]  cyclobenzaprine (FLEXERIL) 10 MG tablet Take 10 mg by mouth 3 (three) times daily as needed for muscle spasms.   Yes [provider]  diltiazem (CARDIZEM SR) 60 MG 12 hr capsule Take 60 mg by mouth daily. 09/21/16  Yes [provider]  esomeprazole (NEXIUM) 40 MG capsule Take 40 mg by mouth daily.    Yes [provider]  furosemide (LASIX) 20 MG tablet Take 20 mg by mouth daily as needed for fluid. 08/24/16  Yes [provider]  Guselkumab (TREMFYA [place]) Inject 1 Dose into the skin every 8 (eight) weeks.   Yes [provider]  hydrOXYzine (ATARAX/VISTARIL) 25 MG tablet  11/03/16  Yes [provider]  ipratropium (ATROVENT) 0.06 % nasal spray Place 2 sprays into the nose as directed.   Yes [provider]  levothyroxine (SYNTHROID, LEVOTHROID) 50 MCG tablet Take 50 mcg by mouth daily before breakfast.   Yes [provider]  acetaminophen (TYLENOL) 325 MG tablet Take 650 mg by mouth 2 (two) times daily as needed for mild pain or headache.  [provider]  apixaban (ELIQUIS) 5 MG TABS tablet Take 5 mg by mouth 2 (two) times daily.    [provider]  ASPIRIN LOW DOSE 81 MG tablet  11/12/16   [provider]  azelastine (ASTELIN) 0.1 % nasal spray  10/13/16   [provider]  clobetasol cream (TEMOVATE) 0.05 %  11/15/16   [provider]  fluocinolone (SYNALAR) 0.01 % external solution  10/27/16   [provider]  neomycin-polymyxin-hydrocortisone (CORTISPORIN) otic solution Place 4 drops into the right ear 4  (four) times daily as needed (psoriasis in ear).    [provider]  nystatin (NYSTATIN) powder Apply topically 4 (four) times daily as needed. Irritation under breast and groin area    [provider]  sucralfate (CARAFATE) 1 GM/10ML suspension Take 10 mLs (1 g total) by mouth 4 (four) times daily. Patient not taking: Reported on 02/06/2017 11/17/16   [name], MD  TREMFYA 100 MG/ML SOSY Inject 100 mg into the skin every 28 (twenty-eight) days. 09/02/16   [provider]    Allergies as of 11/17/2016 - Review Complete 11/17/2016  Allergen Reaction Noted  . Other Itching 10/23/2015    Family History  Problem Relation Age of Onset  . Breast cancer Paternal Aunt   . Other Father   . Heart attack Father     Social History   Social History  . Marital status: Divorced    Spouse name: N/A  . Number of children: N/A  . Years of education: N/A   Occupational History  . Not on file.   Social History Main Topics  . Smoking status: Former Smoker    Types: Cigarettes    Quit date: 02/01/2013  . Smokeless tobacco: Never Used  . Alcohol use No  . Drug use: No  . Sexual activity: Not on file   Other Topics Concern  . Not on file   Social History Narrative   Lives in [place] with multiple family members   unemployed    Review of Systems: See HPI, otherwise negative ROS  Physical Exam: BP 137/89   Pulse 61   Temp (!) 96.7 F (35.9 C) (Oral)   Resp 16   Ht 5\' 4"  (1.626 m)   Wt 295 lb (133.8 kg)   SpO2 98%   BMI 50.64 kg/m  General:   Alert,  pleasant and cooperative in NAD Head:  Normocephalic and atraumatic. Neck:  Supple; no masses or thyromegaly. Lungs:  Clear throughout to auscultation, normal respiratory effort.    Heart:  +S1, +S2, Regular rate and rhythm, No edema. Abdomen:  Soft, nontender and nondistended. Normal bowel sounds, without guarding, and without rebound.   Neurologic:  Alert and  oriented x4;  grossly normal  neurologically.  Impression/Plan: Patricia Walker is here for an colonoscopy to be performed for en endoscopy  For abdominal pain .  Risks, benefits, limitations, and alternatives regarding  Endoscopy  have been reviewed with the patient.  Questions have been answered.  All parties agreeable.   [name], MD  02/06/2017, 8:33 AM

## 2017-02-06 NOTE — Transfer of Care (Signed)
Immediate Anesthesia Transfer of Care Note  Patient: Patricia Walker  Procedure(s) Performed: ESOPHAGOGASTRODUODENOSCOPY (EGD) WITH PROPOFOL (N/A )  Patient Location: PACU and Endoscopy Unit  Anesthesia Type:General  Level of Consciousness: drowsy and patient cooperative  Airway & Oxygen Therapy: Patient Spontanous Breathing and Patient connected to nasal cannula oxygen  Post-op Assessment: Report given to RN and Post -op Vital signs reviewed and stable  Post vital signs: Reviewed and stable  Last Vitals:  Vitals:   02/06/17 0816 02/06/17 0930  BP: 137/89 (!) 143/95  Pulse: 61 67  Resp: 16 16  Temp: (!) 35.9 C (!) 36.3 C  SpO2: 98% 97%    Last Pain:  Vitals:   02/06/17 0930  TempSrc: Tympanic         Complications: No apparent anesthesia complications

## 2017-02-06 NOTE — Op Note (Signed)
[facility] Gastroenterology Patient Name: Patricia Walker Procedure Date: 02/06/2017 9:14 AM MRN: [id number] Account #: [id number] Date of Birth: [birth year] Admit Type: Outpatient Age: 71 Room: [facility] ENDO ROOM 4 Gender: Female Note Status: Finalized Procedure:            Upper GI endoscopy Indications:          Epigastric abdominal pain Providers:            [name] MD, MD Referring MD:         Perrin Maltese, MD (Referring MD) Medicines:            Monitored Anesthesia Care Complications:        No immediate complications. Procedure:            Pre-Anesthesia Assessment:                       - Prior Anticoagulants: The patient has taken Plavix                        (clopidogrel), last dose was 5 days prior to procedure.                       - ASA Grade Assessment: III - A patient with severe                        systemic disease.                       After obtaining informed consent, the endoscope was                        passed under direct vision. Throughout the procedure,                        the patient's blood pressure, pulse, and oxygen                        saturations were monitored continuously. The Endoscope                        was introduced through the mouth, and advanced to the                        third part of duodenum. The upper GI endoscopy was                        accomplished without difficulty. The patient tolerated                        the procedure poorly due to the patient's respiratory                        instability. Findings:      A 5 cm hiatal hernia was present.      The entire examined stomach was normal. Biopsies were taken with a cold       forceps for histology.      LA Grade A (one or more mucosal breaks less than 5 mm, not extending       between tops of 2 mucosal folds) esophagitis with no bleeding was found  in the lower third of the esophagus. Biopsies were taken with a cold       forceps for  histology.      A medium-sized polypoid mass, consistent with adenoma, with no bleeding       was found in the ampulla.      The cardia and gastric fundus were normal on retroflexion. Impression:           - 5 cm hiatal hernia.                       - Normal stomach. Biopsied.                       - LA Grade A reflux esophagitis. Biopsied.                       - Mass (suspected adenoma) in the ampulla. Recommendation:       - Discharge patient to home (with escort).                       - Resume previous diet.                       - Continue present medications.                       - Use Prilosec (omeprazole) 40 mg PO BID for 6 weeks. Procedure Code(s):    --- Professional ---                       [phone], Esophagogastroduodenoscopy, flexible, transoral;                        with biopsy, single or multiple Diagnosis Code(s):    --- Professional ---                       K44.9, Diaphragmatic hernia without obstruction or                        gangrene                       K21.0, Gastro-esophageal reflux disease with esophagitis                       K31.89, Other diseases of stomach and duodenum                       R10.13, Epigastric pain CPT copyright 2016 American Medical Association. All rights reserved. The codes documented in this report are preliminary and upon coder review [place]  be revised to meet current compliance requirements. [name], MD [name] MD, MD 02/06/2017 9:30:44 AM This report has been signed electronically. Number of Addenda: 0 Note Initiated On: 02/06/2017 9:14 AM      [facility]

## 2017-02-07 ENCOUNTER — Ambulatory Visit: Admission: RE | Admit: 2017-02-07 | Discharge: 2017-02-07 | Payer: MEDICARE

## 2017-02-07 ENCOUNTER — Telehealth: Payer: Self-pay

## 2017-02-07 DIAGNOSIS — R21 Rash and other nonspecific skin eruption: Secondary | ICD-10-CM

## 2017-02-07 DIAGNOSIS — L282 Other prurigo: Principal | ICD-10-CM

## 2017-02-07 MED ORDER — TRIAMCINOLONE ACETONIDE 0.1 % TOPICAL OINTMENT
2 refills | 0 days | Status: CP
Start: 2017-02-07 — End: 2017-09-18

## 2017-02-07 NOTE — Telephone Encounter (Signed)
-----   Message from Clent Jacks, RN sent at 02/07/2017 11:58 AM EDT ----- Yes. Will you please let her know that I will call her to schedule it this Thursday. We are making a slight change in EUS schedule for this month and I will not know the availability until 11/1. I am hoping we have some slots on 11/8 if not it will be 11/29. ----- Message ----- From: Leontine Locket, CMA Sent: 02/06/2017   9:51 AM To: Clent Jacks, RN  Good morning Steffanie Dunn,   Can you please schedule this patient for EUS and evaluation per Dr. Vicente Males?  Thank you,  Hanni Milford C.

## 2017-02-07 NOTE — Telephone Encounter (Signed)
Contacted patient per Steffanie Dunn Stanton's request and advised of the following.   ...let her know that I will call her to schedule it this Thursday. We are making a slight change in EUS schedule for this month and I will not know the availability until 11/1. I am hoping we have some slots on 11/8 if not it will be 11/29.

## 2017-02-08 ENCOUNTER — Encounter: Payer: Self-pay | Admitting: Gastroenterology

## 2017-02-08 LAB — SURGICAL PATHOLOGY

## 2017-02-10 ENCOUNTER — Telehealth: Payer: Self-pay

## 2017-02-10 ENCOUNTER — Other Ambulatory Visit: Payer: Self-pay

## 2017-02-10 NOTE — Telephone Encounter (Signed)
  Oncology Nurse Navigator Documentation Received referral for EUS from office of Dr. Vicente Males. EUS has been scheduled for 11/8 with Dr. Cephas Darby at [place]. Went over instructions and copy also mailed to home address. She will need to hold Eliquis at recommendation of ordering physician. I have sent message to Dr. Georgeann Oppenheim office for them to receive this clearance and any other medical clearance she Krausz need for this procedure and to place it in her medical record.  INSTRUCTIONS FOR ENDOSCOPIC ULTRASOUND -Your procedure has been scheduled for November 8th with Dr. Cephas Darby at [facility]. -The hospital Kryder contact you to pre-register over the phone.  -To get your scheduled arrival time, please call the Endoscopy unit at  [phone] between 1-3 p.m. on:  November 7th   -ON THE DAY OF YOU PROCEDURE:   1. If you are scheduled for a morning procedure, nothing to drink after midnight  -If you are scheduled for an afternoon procedure, you Butters have clear liquids until 5 hours prior  to the procedure but no carbonated drinks or broth  2. NO FOOD THE DAY OF YOUR PROCEDURE  3. You Lievanos take your heart, seizure, blood pressure, Parkinson's or breathing medications at  6am with just enough water to get your pills down  4. Do not take any oral Diabetic medications the morning of your procedure.  5. If you are a diabetic and are using insulin, please notify your prescribing physician of this  procedure as your dose Burgener need to be altered related to not being able to eat or drink.   5. Do not take vitamins, iron, or fish oil for 5 days before your procedure     -On the day of your procedure, come to the [facility] Admitting/Registration desk (First desk on the right) at the scheduled arrival time. You MUST have someone drive you home from your procedure. You must have a responsible adult with a valid driver's license who is on site throughout your entire procedure and who can stay with you  for several hours after your procedure. You Borak not go home alone in a taxi, shuttle Briar or bus, as the drivers will not be responsible for you.  --If you have any questions please call me at the above contact  Navigator Location: [facility] (02/10/17 1300)   )Navigator Encounter Type: Telephone (02/10/17 1300) Telephone: Lahoma Crocker Call;Appt Confirmation/Clarification (02/10/17 1300)                           Interventions: Coordination of Care (02/10/17 1300)   Coordination of Care: EUS (02/10/17 1300)                  Time Spent with Patient: 30 (02/10/17 1300)

## 2017-02-13 ENCOUNTER — Telehealth: Payer: Self-pay

## 2017-02-13 NOTE — Telephone Encounter (Signed)
Medical clearance received from Dr. [name] for [facility].  Stop: 3 days prior  Restart: 1 - 2 days after  Patient has been advised. States she's stopping ELIQUIS today. Procedure is on Thursday.

## 2017-02-13 NOTE — Telephone Encounter (Signed)
-----   Message from Clent Jacks, RN sent at 02/10/2017 11:44 AM EDT ----- Regarding: RE: Relayed Message Hi Cardell Rachel, I have scheduled her for 11/8. I will need your office to obtain permission to hold her Eliquis and instruct her regarding. Please put this in chart once obtained. Any other physician clearance will also need to be obtained from your office. I have educated her regarding the EUS and given her instructions. ----- Message ----- From: Leontine Locket, CMA Sent: 02/07/2017  12:41 PM To: Clent Jacks, RN Subject: [place],   I contacted Ms. [name] and relayed your message. She's waiting to hear from you.   Thanks.   ----- Message ----- From: Clent Jacks, RN Sent: 02/07/2017  11:58 AM To: Leontine Locket, CMA  Yes. Will you please let her know that I will call her to schedule it this Thursday. We are making a slight change in EUS schedule for this month and I will not know the availability until 11/1. I am hoping we have some slots on 11/8 if not it will be 11/29. ----- Message ----- From: Leontine Locket, CMA Sent: 02/06/2017   9:51 AM To: Clent Jacks, RN  Good morning Steffanie Dunn,   Can you please schedule this patient for EUS and evaluation per Dr. Vicente Males?  Thank you,  Shunda Rabadi C.

## 2017-02-15 ENCOUNTER — Encounter: Payer: Self-pay | Admitting: *Deleted

## 2017-02-16 ENCOUNTER — Ambulatory Visit: Payer: Medicare HMO | Admitting: Anesthesiology

## 2017-02-16 ENCOUNTER — Ambulatory Visit
Admission: RE | Admit: 2017-02-16 | Discharge: 2017-02-16 | Disposition: A | Discharge status: Home / Self Care | Payer: Medicare HMO | Source: Ambulatory Visit | Attending: Gastroenterology | Admitting: Gastroenterology

## 2017-02-16 ENCOUNTER — Encounter: Payer: Self-pay | Admitting: *Deleted

## 2017-02-16 ENCOUNTER — Encounter
Admission: RE | Disposition: A | Discharge status: Home / Self Care | Payer: Self-pay | Source: Ambulatory Visit | Attending: Gastroenterology

## 2017-02-16 DIAGNOSIS — I2510 Atherosclerotic heart disease of native coronary artery without angina pectoris: Secondary | ICD-10-CM | POA: Insufficient documentation

## 2017-02-16 DIAGNOSIS — Z7901 Long term (current) use of anticoagulants: Secondary | ICD-10-CM | POA: Insufficient documentation

## 2017-02-16 DIAGNOSIS — K219 Gastro-esophageal reflux disease without esophagitis: Secondary | ICD-10-CM | POA: Diagnosis not present

## 2017-02-16 DIAGNOSIS — I481 Persistent atrial fibrillation: Secondary | ICD-10-CM | POA: Insufficient documentation

## 2017-02-16 DIAGNOSIS — Z6841 Body Mass Index (BMI) 40.0 and over, adult: Secondary | ICD-10-CM | POA: Insufficient documentation

## 2017-02-16 DIAGNOSIS — I252 Old myocardial infarction: Secondary | ICD-10-CM | POA: Diagnosis not present

## 2017-02-16 DIAGNOSIS — Z9989 Dependence on other enabling machines and devices: Secondary | ICD-10-CM | POA: Insufficient documentation

## 2017-02-16 DIAGNOSIS — K839 Disease of biliary tract, unspecified: Secondary | ICD-10-CM | POA: Diagnosis present

## 2017-02-16 DIAGNOSIS — Z7982 Long term (current) use of aspirin: Secondary | ICD-10-CM | POA: Insufficient documentation

## 2017-02-16 DIAGNOSIS — E785 Hyperlipidemia, unspecified: Secondary | ICD-10-CM | POA: Diagnosis not present

## 2017-02-16 DIAGNOSIS — K2980 Duodenitis without bleeding: Secondary | ICD-10-CM | POA: Diagnosis not present

## 2017-02-16 DIAGNOSIS — J449 Chronic obstructive pulmonary disease, unspecified: Secondary | ICD-10-CM | POA: Diagnosis not present

## 2017-02-16 DIAGNOSIS — Z79899 Other long term (current) drug therapy: Secondary | ICD-10-CM | POA: Diagnosis not present

## 2017-02-16 DIAGNOSIS — Z885 Allergy status to narcotic agent status: Secondary | ICD-10-CM | POA: Diagnosis not present

## 2017-02-16 DIAGNOSIS — L409 Psoriasis, unspecified: Secondary | ICD-10-CM | POA: Insufficient documentation

## 2017-02-16 DIAGNOSIS — I10 Essential (primary) hypertension: Secondary | ICD-10-CM | POA: Insufficient documentation

## 2017-02-16 DIAGNOSIS — Z87891 Personal history of nicotine dependence: Secondary | ICD-10-CM | POA: Insufficient documentation

## 2017-02-16 DIAGNOSIS — G4730 Sleep apnea, unspecified: Secondary | ICD-10-CM | POA: Diagnosis not present

## 2017-02-16 HISTORY — PX: EUS: SHX5427

## 2017-02-16 SURGERY — ULTRASOUND, UPPER GI TRACT, ENDOSCOPIC
Anesthesia: General

## 2017-02-16 MED ORDER — SODIUM CHLORIDE 0.9 % IV SOLN
INTRAVENOUS | Status: DC
  Administered 2017-02-16: 13:00:00 via INTRAVENOUS

## 2017-02-16 MED ORDER — LABETALOL HCL 5 MG/ML IV SOLN
INTRAVENOUS | Status: AC
  Filled 2017-02-16: qty 4

## 2017-02-16 MED ORDER — MIDAZOLAM HCL 2 MG/2ML IJ SOLN
INTRAMUSCULAR | Status: AC
  Filled 2017-02-16: qty 2

## 2017-02-16 MED ORDER — LIDOCAINE HCL (CARDIAC) 20 MG/ML IV SOLN
INTRAVENOUS | Status: DC | PRN
  Administered 2017-02-16: 30 mg via INTRAVENOUS

## 2017-02-16 MED ORDER — FENTANYL CITRATE (PF) 100 MCG/2ML IJ SOLN
INTRAMUSCULAR | Status: DC | PRN
  Administered 2017-02-16 (×2): 25 ug via INTRAVENOUS
  Administered 2017-02-16: 50 ug via INTRAVENOUS

## 2017-02-16 MED ORDER — LIDOCAINE HCL (PF) 2 % IJ SOLN
INTRAMUSCULAR | Status: AC
  Filled 2017-02-16: qty 10

## 2017-02-16 MED ORDER — LABETALOL HCL 5 MG/ML IV SOLN
INTRAVENOUS | Status: DC | PRN
  Administered 2017-02-16: 5 mg via INTRAVENOUS

## 2017-02-16 MED ORDER — PROPOFOL 500 MG/50ML IV EMUL
INTRAVENOUS | Status: AC
  Filled 2017-02-16: qty 50

## 2017-02-16 MED ORDER — PROPOFOL 500 MG/50ML IV EMUL
INTRAVENOUS | Status: DC | PRN
  Administered 2017-02-16: 140 ug/kg/min via INTRAVENOUS

## 2017-02-16 MED ORDER — FENTANYL CITRATE (PF) 100 MCG/2ML IJ SOLN
INTRAMUSCULAR | Status: AC
  Filled 2017-02-16: qty 2

## 2017-02-16 MED ORDER — MIDAZOLAM HCL 2 MG/2ML IJ SOLN
INTRAMUSCULAR | Status: DC | PRN
  Administered 2017-02-16: 2 mg via INTRAVENOUS

## 2017-02-16 NOTE — H&P (Signed)
PRE-PROCEDURE HISTORY AND PHYSICAL   Breta Alleva presents for her scheduled Procedure(s): FULL UPPER ENDOSCOPIC ULTRASOUND (EUS) RADIAL.  The indication for the procedure(s) is polypoid mass in ampulla.  There have been no significant recent changes in the patient's medical status.  Past Medical History:  Diagnosis Date  . Coronary artery disease    mild, nonobstructive  . Dysrhythmia    Atrial Fibrillation  . GERD (gastroesophageal reflux disease)   . Hyperlipidemia   . Hypertension   . 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 Morbid obesity ([employer])   . Persistent atrial fibrillation ([place])   . Psoriasis   . Sleep apnea    compliant with CPAP  . Thyroid disease     Past Surgical History:  Procedure Laterality Date  . CARDIAC CATHETERIZATION    . ESOPHAGEAL DILATION    . US ECHOCARDIOGRAPHY      Allergies Allergies  Allergen Reactions  . Codeine Itching  . Other Itching    States antibiotic in the past caused itching but can not remember name    Medications Acetaminophen, BLADDER CONTROL PAD REGULAR, Calcium Citrate-Vitamin D, Guselkumab, Vitamin D3, acetaminophen, apixaban, aspirin, atorvastatin, azelastine, bisacodyl, budesonide-formoterol, calcium carbonate, cetirizine, clobetasol cream, cyclobenzaprine, diltiazem, esomeprazole, fluocinolone, furosemide, hydrOXYzine, ibuprofen, ipratropium, levothyroxine, neomycin-polymyxin-hydrocortisone, nystatin, and sucralfate   Eliquis had been held since 02/13/17  Physical Examination  Body mass index is 50.64 kg/m. BP (!) 149/78   Pulse 75   Temp (!) 96.9 F (36.1 C) (Tympanic)   Resp (!) 24   Ht 5\' 4"  (1.626 m)   Wt 133.8 kg (295 lb)   SpO2 100%   BMI 50.64 kg/m  General:   Alert,  pleasant and cooperative in NAD Head:  Normocephalic and atraumatic. Neck:  Supple; no masses or thyromegaly. Lungs:  Clear throughout to auscultation.    Heart:  Regular rate and rhythm. Abdomen:  Soft,mild tenderness to  palpation. Neurologic:  Alert and  oriented x4;  grossly normal neurologically.  ASSESSMENT AND PLAN  Ms. Sprague has been evaluated and deemed appropriate to undergo the planned Procedure(s): FULL UPPER ENDOSCOPIC ULTRASOUND (EUS)

## 2017-02-16 NOTE — Op Note (Signed)
[facility] Gastroenterology Patient Name: Patricia Walker Procedure Date: 02/16/2017 12:14 PM MRN: [id number] Account #: [id number] Date of Birth: 11-01-1945 Admit Type: Outpatient Age: 71 Room: [facility] ENDO ROOM 3 Gender: Female Note Status: Finalized Procedure:            Upper EUS Indications:          Mucosal mass/polyp involving the major papilla (found                        on endoscopy) Patient Profile:      Refer to note in patient chart for documentation of                        history and physical. Providers:            Lenetta Quaker. Cephas Darby, MD Referring MD:         [name] MD, MD (Referring MD), Perrin Maltese, MD                        (Referring MD) Medicines:            Monitored Anesthesia Care Complications:        No immediate complications. Procedure:            Pre-Anesthesia Assessment:                       - Monitored anesthesia care under the supervision of an                        anesthesiologist was determined to be medically                        necessary for this procedure based on complex procedure                        (ERCP, EUS).                       After obtaining informed consent, the endoscope was                        passed under direct vision. Throughout the procedure,                        the patient's blood pressure, pulse, and oxygen                        saturations were monitored continuously. The Endoscope                        was introduced through the mouth, and advanced to the                        second part of duodenum. The EUS GI Radial Array                        [id number] was introduced through the mouth, and advanced                        to the duodenum for ultrasound examination from the  esophagus, stomach and duodenum. Findings:      Endoscopic Finding :      The entire examined stomach was endoscopically normal.      Localized nodular mucosa was found in the area of the  papilla. Biopsies       were taken with a cold forceps for histology.      Endosonographic Finding :      There was no sign of significant endosonographic abnormality in the       ampulla. No clear masses were identified.      There was no sign of significant endosonographic abnormality in the       pancreatic head. The pancreatic duct measured up to 1.5 mm in diameter.      There was no sign of significant endosonographic abnormality in the       common bile duct. The maximum diameter of the duct was 2.9 mm.      There was no sign of significant endosonographic abnormality in the left       lobe of the liver. No masses were identified.      No lymph nodes were visualized in the celiac region (level 20) and       perigastric region. Impression:           EGD Impression:                       - The esophagus was not visualized with the side                        viewing endoscope.                       - Normal stomach.                       - Nodular mucosa in the area of the papilla. Biopsied.                       EUS Impression:                       - There was no sign of significant pathology in the                        ampulla.                       - There was no sign of significant pathology in the                        pancreatic head.                       - There was no sign of significant pathology in the                        common bile duct.                       - There was no evidence of significant pathology in the                        left lobe of the liver.                       -  No lymph nodes were visualized in the celiac region                        (level 20) and perigastric region. Recommendation:       - Discharge patient to home (via wheelchair).                       - Await path results.                       - If pathology confirms adenoma will plan for                        endoscopic ampullectomy with ERCP at [facility].                       -  Return to referring physician as previously scheduled.                       - The findings and recommendations were discussed with                        the patient and their family. Procedure Code(s):    --- Professional ---                       [id number], Esophagogastroduodenoscopy, flexible, transoral;                        with endoscopic ultrasound examination limited to the                        esophagus, stomach or duodenum, and adjacent structures                       09381, 59, Esophagogastroduodenoscopy, flexible,                        transoral; with biopsy, single or multiple Diagnosis Code(s):    --- Professional ---                       K31.89, Other diseases of stomach and duodenum                       K83.9, Disease of biliary tract, unspecified CPT copyright 2016 American Medical Association. All rights reserved. The codes documented in this report are preliminary and upon coder review [place]  be revised to meet current compliance requirements. Dr. Lenetta Quaker. Cephas Darby, MD Lenetta Quaker. Delando Satter, MD 02/16/2017 1:57:20 PM This report has been signed electronically. Number of Addenda: 0 Note Initiated On: 02/16/2017 12:14 PM Estimated Blood Loss: Estimated blood loss: none.      [facility]

## 2017-02-16 NOTE — Anesthesia Preprocedure Evaluation (Signed)
Anesthesia Evaluation  Patient identified by MRN, date of birth, ID band Patient awake    Reviewed: Allergy & Precautions, NPO status , Patient's Chart, lab work & pertinent test results  History of Anesthesia Complications Negative for: history of anesthetic complications  Airway Mallampati: III       Dental   Pulmonary sleep apnea , COPD,  COPD inhaler, former smoker,           Cardiovascular hypertension, Pt. on medications + Past MI  (-) CHF (-) dysrhythmias (-) Valvular Problems/Murmurs     Neuro/Psych    GI/Hepatic Neg liver ROS, GERD  Medicated,  Endo/Other  neg diabetes  Renal/GU negative Renal ROS     Musculoskeletal   Abdominal   Peds  Hematology   Anesthesia Other Findings   Reproductive/Obstetrics                             Anesthesia Physical Anesthesia Plan  ASA: III  Anesthesia Plan: General   Post-op Pain Management:    Induction: Intravenous  PONV Risk Score and Plan:   Airway Management Planned:   Additional Equipment:   Intra-op Plan:   Post-operative Plan:   Informed Consent: I have reviewed the patients History and Physical, chart, labs and discussed the procedure including the risks, benefits and alternatives for the proposed anesthesia with the patient or authorized representative who has indicated his/her understanding and acceptance.     Plan Discussed with:   Anesthesia Plan Comments:         Anesthesia Quick Evaluation

## 2017-02-16 NOTE — Anesthesia Procedure Notes (Signed)
Performed by: Cook-Martin, Kyley Laurel Pre-anesthesia Checklist: Patient identified, Emergency Drugs available, Suction available, Patient being monitored and Timeout performed Patient Re-evaluated:Patient Re-evaluated prior to induction Oxygen Delivery Method: Nasal cannula Preoxygenation: Pre-oxygenation with 100% oxygen Induction Type: IV induction Airway Equipment and Method: Bite block Placement Confirmation: CO2 detector and positive ETCO2       

## 2017-02-16 NOTE — Transfer of Care (Signed)
Immediate Anesthesia Transfer of Care Note  Patient: Patricia Walker  Procedure(s) Performed: FULL UPPER ENDOSCOPIC ULTRASOUND (EUS) RADIAL (N/A )  Patient Location: PACU  Anesthesia Type:General  Level of Consciousness: awake and sedated  Airway & Oxygen Therapy: Patient Spontanous Breathing and Patient connected to face mask oxygen  Post-op Assessment: Report given to RN and Post -op Vital signs reviewed and stable  Post vital signs: Reviewed and stable  Last Vitals:  Vitals:   02/16/17 1255  BP: (!) 149/78  Pulse: 75  Resp: (!) 24  Temp: (!) 36.1 C  SpO2: 100%    Last Pain:  Vitals:   02/16/17 1255  TempSrc: Tympanic         Complications: No apparent anesthesia complications

## 2017-02-16 NOTE — Anesthesia Postprocedure Evaluation (Signed)
Anesthesia Post Note  Patient: Patricia Walker  Procedure(s) Performed: FULL UPPER ENDOSCOPIC ULTRASOUND (EUS) RADIAL (N/A )  Patient location during evaluation: Endoscopy Anesthesia Type: General Level of consciousness: awake and alert Pain management: pain level controlled Vital Signs Assessment: post-procedure vital signs reviewed and stable Respiratory status: spontaneous breathing and respiratory function stable Cardiovascular status: stable Anesthetic complications: no     Last Vitals:  Vitals:   02/16/17 1357 02/16/17 1407  BP: (!) 159/109 (!) 115/94  Pulse: 75 76  Resp: 19 18  Temp:    SpO2: 100% 100%    Last Pain:  Vitals:   02/16/17 1407  TempSrc:   PainSc: 0-No pain                 KEPHART,WILLIAM K

## 2017-02-16 NOTE — Anesthesia Post-op Follow-up Note (Signed)
Anesthesia QCDR form completed.        

## 2017-02-17 ENCOUNTER — Telehealth: Payer: Self-pay | Admitting: Gastroenterology

## 2017-02-17 ENCOUNTER — Encounter: Payer: Self-pay | Admitting: Gastroenterology

## 2017-02-17 LAB — SURGICAL PATHOLOGY

## 2017-02-17 NOTE — Telephone Encounter (Signed)
Called patient to check how she is doing , having some sore throat , headache and ear ache.   Suggested salt water gargle, tylenol and if no better to call back  Dr [name] MD,MRCP [facility]) Gastroenterology/Hepatology Pager: [phone]

## 2017-02-17 NOTE — OR Nursing (Signed)
PT.is having severe left -sided throat pain with ear involement  Will discuss with DR. Vicente Males this AM.No elevated temperature.

## 2017-02-19 ENCOUNTER — Encounter: Payer: Self-pay | Admitting: Gastroenterology

## 2017-02-28 ENCOUNTER — Ambulatory Visit: Admission: RE | Admit: 2017-02-28 | Discharge: 2017-02-28 | Payer: MEDICARE

## 2017-02-28 DIAGNOSIS — Z79899 Other long term (current) drug therapy: Principal | ICD-10-CM

## 2017-02-28 DIAGNOSIS — R21 Rash and other nonspecific skin eruption: Secondary | ICD-10-CM

## 2017-02-28 MED ORDER — METHOTREXATE SODIUM 2.5 MG TABLET: tablet | 1 refills | 0 days | Status: AC

## 2017-02-28 MED ORDER — FOLIC ACID 1 MG TABLET
ORAL_TABLET | 5 refills | 0 days | Status: CP
Start: 2017-02-28 — End: 2017-04-26

## 2017-02-28 MED ORDER — FOLIC ACID 1 MG TABLET: tablet | 5 refills | 0 days | Status: AC

## 2017-02-28 MED ORDER — METHOTREXATE SODIUM 2.5 MG TABLET
ORAL_TABLET | 1 refills | 0.00000 days | Status: CP
Start: 2017-02-28 — End: 2017-02-28

## 2017-04-17 ENCOUNTER — Other Ambulatory Visit: Payer: Self-pay | Admitting: Internal Medicine

## 2017-04-17 DIAGNOSIS — M79662 Pain in left lower leg: Secondary | ICD-10-CM

## 2017-04-17 DIAGNOSIS — M79605 Pain in left leg: Principal | ICD-10-CM

## 2017-04-17 DIAGNOSIS — M79604 Pain in right leg: Secondary | ICD-10-CM

## 2017-04-17 DIAGNOSIS — M79661 Pain in right lower leg: Secondary | ICD-10-CM

## 2017-04-21 ENCOUNTER — Ambulatory Visit
Admission: RE | Admit: 2017-04-21 | Discharge: 2017-04-21 | Disposition: A | Discharge status: Home / Self Care | Payer: Medicare HMO | Source: Ambulatory Visit | Attending: Internal Medicine | Admitting: Internal Medicine

## 2017-04-21 DIAGNOSIS — M79605 Pain in left leg: Secondary | ICD-10-CM | POA: Diagnosis present

## 2017-04-21 DIAGNOSIS — M7121 Synovial cyst of popliteal space [Baker], right knee: Secondary | ICD-10-CM | POA: Insufficient documentation

## 2017-04-21 DIAGNOSIS — M79604 Pain in right leg: Secondary | ICD-10-CM | POA: Diagnosis not present

## 2017-04-24 ENCOUNTER — Ambulatory Visit
Admission: RE | Admit: 2017-04-24 | Discharge: 2017-04-24 | Disposition: A | Discharge status: Home / Self Care | Payer: Medicare HMO | Source: Ambulatory Visit | Attending: Internal Medicine | Admitting: Internal Medicine

## 2017-04-24 DIAGNOSIS — M79662 Pain in left lower leg: Secondary | ICD-10-CM

## 2017-04-24 DIAGNOSIS — M79605 Pain in left leg: Secondary | ICD-10-CM | POA: Insufficient documentation

## 2017-04-24 DIAGNOSIS — I7090 Unspecified atherosclerosis: Secondary | ICD-10-CM | POA: Diagnosis not present

## 2017-04-24 DIAGNOSIS — M79661 Pain in right lower leg: Secondary | ICD-10-CM

## 2017-04-24 DIAGNOSIS — M79604 Pain in right leg: Secondary | ICD-10-CM | POA: Insufficient documentation

## 2017-04-26 ENCOUNTER — Ambulatory Visit: Admit: 2017-04-26 | Discharge: 2017-04-27 | Payer: MEDICARE

## 2017-04-26 DIAGNOSIS — R21 Rash and other nonspecific skin eruption: Secondary | ICD-10-CM

## 2017-04-26 DIAGNOSIS — L409 Psoriasis, unspecified: Secondary | ICD-10-CM

## 2017-04-26 DIAGNOSIS — Z79899 Other long term (current) drug therapy: Principal | ICD-10-CM

## 2017-04-26 MED ORDER — FOLIC ACID 1 MG TABLET
ORAL_TABLET | 5 refills | 0 days | Status: CP
Start: 2017-04-26 — End: ?

## 2017-04-26 MED ORDER — METHOTREXATE SODIUM 2.5 MG TABLET
ORAL_TABLET | 1 refills | 0 days | Status: CP
Start: 2017-04-26 — End: 2017-09-19

## 2017-04-26 MED ORDER — CLOBETASOL 0.05 % TOPICAL OINTMENT
Freq: Two times a day (BID) | TOPICAL | 5 refills | 0 days | Status: CP
Start: 2017-04-26 — End: 2017-09-18

## 2017-04-28 DIAGNOSIS — M51369 Other intervertebral disc degeneration, lumbar region without mention of lumbar back pain or lower extremity pain: Secondary | ICD-10-CM | POA: Insufficient documentation

## 2017-04-28 DIAGNOSIS — M5136 Other intervertebral disc degeneration, lumbar region: Secondary | ICD-10-CM | POA: Insufficient documentation

## 2017-05-21 ENCOUNTER — Emergency Department: Payer: Medicare HMO

## 2017-05-21 ENCOUNTER — Other Ambulatory Visit: Payer: Self-pay

## 2017-05-21 ENCOUNTER — Encounter: Payer: Self-pay | Admitting: Emergency Medicine

## 2017-05-21 ENCOUNTER — Emergency Department
Admission: EM | Admit: 2017-05-21 | Discharge: 2017-05-21 | Disposition: A | Discharge status: Home / Self Care | Payer: Medicare HMO | Attending: Emergency Medicine | Admitting: Emergency Medicine

## 2017-05-21 DIAGNOSIS — I10 Essential (primary) hypertension: Secondary | ICD-10-CM | POA: Diagnosis not present

## 2017-05-21 DIAGNOSIS — I252 Old myocardial infarction: Secondary | ICD-10-CM | POA: Insufficient documentation

## 2017-05-21 DIAGNOSIS — R0602 Shortness of breath: Secondary | ICD-10-CM | POA: Insufficient documentation

## 2017-05-21 DIAGNOSIS — Z7982 Long term (current) use of aspirin: Secondary | ICD-10-CM | POA: Diagnosis not present

## 2017-05-21 DIAGNOSIS — E079 Disorder of thyroid, unspecified: Secondary | ICD-10-CM | POA: Insufficient documentation

## 2017-05-21 DIAGNOSIS — H53149 Visual discomfort, unspecified: Secondary | ICD-10-CM | POA: Diagnosis not present

## 2017-05-21 DIAGNOSIS — Z79899 Other long term (current) drug therapy: Secondary | ICD-10-CM | POA: Insufficient documentation

## 2017-05-21 DIAGNOSIS — I2510 Atherosclerotic heart disease of native coronary artery without angina pectoris: Secondary | ICD-10-CM | POA: Diagnosis not present

## 2017-05-21 DIAGNOSIS — Z87891 Personal history of nicotine dependence: Secondary | ICD-10-CM | POA: Diagnosis not present

## 2017-05-21 DIAGNOSIS — R5383 Other fatigue: Secondary | ICD-10-CM

## 2017-05-21 DIAGNOSIS — Z7901 Long term (current) use of anticoagulants: Secondary | ICD-10-CM | POA: Insufficient documentation

## 2017-05-21 DIAGNOSIS — R519 Headache, unspecified: Secondary | ICD-10-CM

## 2017-05-21 DIAGNOSIS — R51 Headache: Secondary | ICD-10-CM

## 2017-05-21 LAB — COMPREHENSIVE METABOLIC PANEL
ALT: 17 U/L (ref 14–54)
AST: 19 U/L (ref 15–41)
Albumin: 3.5 g/dL (ref 3.5–5.0)
Alkaline Phosphatase: 73 U/L (ref 38–126)
Anion gap: 9 (ref 5–15)
BUN: 22 mg/dL — ABNORMAL HIGH (ref 6–20)
CO2: 27 mmol/L (ref 22–32)
Calcium: 8.8 mg/dL — ABNORMAL LOW (ref 8.9–10.3)
Chloride: 105 mmol/L (ref 101–111)
Creatinine, Ser: 0.98 mg/dL (ref 0.44–1.00)
GFR calc Af Amer: >60 mL/min (ref >60)
GFR calc non Af Amer: 57 mL/min — ABNORMAL LOW (ref >60)
Glucose, Bld: 108 mg/dL — ABNORMAL HIGH (ref 65–99)
Potassium: 3.9 mmol/L (ref 3.5–5.1)
Sodium: 141 mmol/L (ref 135–145)
Total Bilirubin: 0.7 mg/dL (ref 0.3–1.2)
Total Protein: 6.7 g/dL (ref 6.5–8.1)

## 2017-05-21 LAB — CBC WITH DIFFERENTIAL/PLATELET
Basophils Absolute: 0.1 10*3/uL (ref 0–0.1)
Basophils Relative: 1 %
Eosinophils Absolute: 0.1 10*3/uL (ref 0–0.7)
Eosinophils Relative: 3 %
HCT: 39.9 % (ref 35.0–47.0)
Hemoglobin: 13.4 g/dL (ref 12.0–16.0)
Lymphocytes Relative: 21 %
Lymphs Abs: 1.1 10*3/uL (ref 1.0–3.6)
MCH: 29.7 pg (ref 26.0–34.0)
MCHC: 33.5 g/dL (ref 32.0–36.0)
MCV: 88.5 fL (ref 80.0–100.0)
Monocytes Absolute: 0.4 10*3/uL (ref 0.2–0.9)
Monocytes Relative: 7 %
Neutro Abs: 3.7 10*3/uL (ref 1.4–6.5)
Neutrophils Relative %: 68 %
Platelets: 235 10*3/uL (ref 150–440)
RBC: 4.51 MIL/uL (ref 3.80–5.20)
RDW: 16.6 % — ABNORMAL HIGH (ref 11.5–14.5)
WBC: 5.4 10*3/uL (ref 3.6–11.0)

## 2017-05-21 LAB — TROPONIN I

## 2017-05-21 MED ORDER — IOPAMIDOL (ISOVUE-370) INJECTION 76%
75.0000 mL | Freq: Once | INTRAVENOUS | Status: AC | PRN
  Administered 2017-05-21: 75 mL via INTRAVENOUS

## 2017-05-21 MED ORDER — BUTALBITAL-APAP-CAFFEINE 50-325-40 MG PO TABS: 1.0000 | ORAL_TABLET | Freq: Four times a day (QID) | ORAL | 0 refills | Status: DC | PRN

## 2017-05-21 NOTE — ED Notes (Signed)
Pt reports having all belongings at time of d/c and is taken to lobby by this RN in a wheelchair to wait for ride. Pt in NAd at this time

## 2017-05-21 NOTE — ED Notes (Signed)
Patient transported to X-ray 

## 2017-05-21 NOTE — ED Provider Notes (Signed)
[facility] Emergency Department Provider Note  ____________________________________________   First MD Initiated Contact with Patient 05/21/17 [phone]     (approximate)  I have reviewed the triage vital signs and the nursing notes.   HISTORY  Chief Complaint Headache; Tingling; and Shortness of Breath   HPI Aleiah Mohammed Cozzolino is a 72 y.o. female who comes to the emergency department via EMS with 5 days of daily headache.  Headache is gradual onset not maximal onset bifrontal throbbing although it is different from previous headaches she has ever had.  She reports mild photophobia.  No neck pain.  No fevers or chills.  No nausea or vomiting.  No double vision or blurred vision.  She does report mild shortness of breath.  Nothing in particular seems to make her symptoms better or worse.  Past Medical History:  Diagnosis Date  . Coronary artery disease    mild, nonobstructive  . Dysrhythmia    Atrial Fibrillation  . GERD (gastroesophageal reflux disease)   . Hyperlipidemia   . Hypertension   . 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 Morbid obesity ([place])   . Persistent atrial fibrillation ([place])   . Psoriasis   . Sleep apnea    compliant with CPAP  . Thyroid disease     Patient Active Problem List   Diagnosis Date Noted  . Osteoarthritis of knee 11/17/2016  . Coronary artery disease 09/16/2016  . History of cardioversion 09/16/2016  . Hyperlipidemia, unspecified 09/16/2016  . Hypertension 09/16/2016  . Obesity, unspecified 09/16/2016  . Atrial fibrillation ([place]) 09/08/2016  . Cardiac arrhythmia 09/09/2015  . Gastroesophageal reflux disease 09/09/2015  . Obstructive sleep apnea syndrome 09/09/2015  . Psoriasis 09/09/2015    Past Surgical History:  Procedure Laterality Date  . CARDIAC CATHETERIZATION    . CARDIOVERSION Right 09/01/2016   Procedure: Cardioversion;  Surgeon: [name], MD;  Location: [facility] ORS;  Service: Cardiovascular;   Laterality: Right;  . CARDIOVERSION N/A 09/09/2016   Procedure: Cardioversion;  Surgeon: [name], MD;  Location: [facility] ORS;  Service: Cardiovascular;  Laterality: N/A;  . ELECTROPHYSIOLOGIC STUDY N/A 02/01/2016   Procedure: CARDIOVERSION;  Surgeon: [name], MD;  Location: [facility] ORS;  Service: Cardiovascular;  Laterality: N/A;  . ESOPHAGEAL DILATION    . ESOPHAGOGASTRODUODENOSCOPY (EGD) WITH PROPOFOL N/A 02/06/2017   Procedure: ESOPHAGOGASTRODUODENOSCOPY (EGD) WITH PROPOFOL;  Surgeon: [name], MD;  Location: [facility] ENDOSCOPY;  Service: Gastroenterology;  Laterality: N/A;  . EUS N/A 02/16/2017   Procedure: FULL UPPER ENDOSCOPIC ULTRASOUND (EUS) RADIAL;  Surgeon: [name], MD;  Location: [facility] ENDOSCOPY;  Service: Gastroenterology;  Laterality: N/A;  . LEFT HEART CATH AND CORONARY ANGIOGRAPHY N/A 09/08/2016   Procedure: Left Heart Cath and Coronary Angiography;  Surgeon: [name], MD;  Location: [name] CV LAB;  Service: Cardiovascular;  Laterality: N/A;  . US ECHOCARDIOGRAPHY      Prior to Admission medications   Medication Sig Start Date End Date Taking? Authorizing Provider  acetaminophen (TYLENOL) 325 MG tablet Take 650 mg by mouth 2 (two) times daily as needed for mild pain or headache.    [provider]  Acetaminophen 80 MG TBDP  01/31/17   [provider]  apixaban (ELIQUIS) 5 MG TABS tablet Take 5 mg by mouth 2 (two) times daily.    [provider]  ASPIRIN LOW DOSE 81 MG tablet  11/12/16   [provider]  atorvastatin (LIPITOR) 20 MG tablet Take  20 mg by mouth every evening. 08/25/16   [provider]  azelastine (ASTELIN) 0.1 % nasal spray  10/13/16   [provider]  BISACODYL LAXATIVE 5 MG EC tablet  01/31/17   [provider]  budesonide-formoterol (SYMBICORT) 160-4.5 MCG/ACT inhaler Inhale 2 puffs into the lungs 2 (two) times daily.    [provider]    butalbital-acetaminophen-caffeine (FIORICET, ESGIC) [id number] MG tablet Take 1-2 tablets by mouth every 6 (six) hours as needed for headache. 05/21/17 05/21/18  Darel Hong, MD  calcium carbonate (TUMS - DOSED IN MG ELEMENTAL CALCIUM) 500 MG chewable tablet Chew 500 mg by mouth daily as needed for indigestion or heartburn.     [provider]  CALCIUM CITRATE-VITAMIN D PO  12/16/16   [provider]  cetirizine (ZYRTEC) 10 MG tablet Take 10 mg by mouth daily.    [provider]  cetirizine (ZYRTEC) 10 MG tablet cetirizine 10 mg tablet  TAKE 1 TABLET BY MOUTH EVERY DAY    [provider]  Cholecalciferol (VITAMIN D3) 50000 units CAPS Take 50,000 Units by mouth once a week. Monday  08/24/16   [provider]  clobetasol cream (TEMOVATE) 0.05 %  11/15/16   [provider]  cyclobenzaprine (FLEXERIL) 10 MG tablet Take 10 mg by mouth 3 (three) times daily as needed for muscle spasms.    [provider]  diltiazem (CARDIZEM SR) 60 MG 12 hr capsule Take 60 mg by mouth daily. 09/21/16   [provider]  esomeprazole (NEXIUM) 40 MG capsule Take 40 mg by mouth daily.     [provider]  fluocinolone (SYNALAR) 0.01 % external solution  10/27/16   [provider]  furosemide (LASIX) 20 MG tablet Take 20 mg by mouth daily as needed for fluid. 08/24/16   [provider]  Guselkumab (TREMFYA [place]) Inject 1 Dose into the skin every 8 (eight) weeks.    [provider]  hydrOXYzine (ATARAX/VISTARIL) 25 MG tablet  11/03/16   [provider]  ibuprofen (ADVIL,MOTRIN) 100 MG tablet  01/31/17   [provider]  Incontinence Supply Disposable (BLADDER CONTROL PAD REGULAR) MISC  01/31/17   [provider]  ipratropium (ATROVENT) 0.06 % nasal spray Place 2 sprays into the nose as directed.    [provider]  levothyroxine (SYNTHROID, LEVOTHROID) 50 MCG tablet Take 50 mcg by mouth daily  before breakfast.    [provider]  neomycin-polymyxin-hydrocortisone (CORTISPORIN) otic solution Place 4 drops into the right ear 4 (four) times daily as needed (psoriasis in ear).    [provider]  nystatin (NYSTATIN) powder Apply topically 4 (four) times daily as needed. Irritation under breast and groin area    [provider]  sucralfate (CARAFATE) 1 GM/10ML suspension Take 10 mLs (1 g total) by mouth 4 (four) times daily. Patient not taking: Reported on 02/06/2017 11/17/16   [name], MD  TREMFYA 100 MG/ML SOSY Inject 100 mg into the skin every 28 (twenty-eight) days. 09/02/16   [provider]    Allergies Codeine and Other  Family History  Problem Relation Age of Onset  . Breast cancer Paternal Aunt   . Other Father   . Heart attack Father     Social History Social History   Tobacco Use  . Smoking status: Former Smoker    Types: Cigarettes    Last attempt to quit: 02/01/2013    Years since quitting: 4.3  . Smokeless tobacco: Never Used  Substance Use Topics  . Alcohol use: No  . Drug use: No    Review of Systems Constitutional: No fever/chills Eyes: No visual changes. ENT: No sore throat. Cardiovascular: Denies chest pain. Respiratory: Positive for shortness of breath. Gastrointestinal: No abdominal pain.  No nausea, no vomiting.  No diarrhea.  No constipation. Genitourinary: Negative for dysuria. Musculoskeletal: Negative for back pain. Skin: Negative for rash. Neurological: Positive for headache   ____________________________________________   PHYSICAL EXAM:  VITAL SIGNS: ED Triage Vitals [05/21/17 0926]  Enc Vitals Group     BP      Pulse      Resp      Temp      Temp src      SpO2 99 %     Weight      Height      Head Circumference      Peak Flow      Pain Score      Pain Loc      Pain Edu?      Excl. in [place]?     Constitutional: Alert and oriented x4 well-appearing nontoxic no diaphoresis speaks  full clear sentences Eyes: PERRL EOMI. mid range and brisk Head: Atraumatic. Nose: No congestion/rhinnorhea. Mouth/Throat: No trismus Neck: No stridor.  No meningismus Cardiovascular: Normal rate, regular rhythm. Grossly normal heart sounds.  Good peripheral circulation. Respiratory: Normal respiratory effort.  No retractions. Lungs CTAB and moving good air Gastrointestinal: Soft nontender Musculoskeletal: No lower extremity edema   Neurologic:  Normal speech and language. No gross focal neurologic deficits are appreciated. Skin:  Skin is warm, dry and intact. No rash noted. Psychiatric: Mood and affect are normal. Speech and behavior are normal.    ____________________________________________   DIFFERENTIAL includes but not limited to  Subarachnoid hemorrhage, temporal arteritis, glaucoma, viral syndrome, stress headache ____________________________________________   LABS (all labs ordered are listed, but only abnormal results are displayed)  Labs Reviewed  COMPREHENSIVE METABOLIC PANEL - Abnormal; Notable for the following components:      Result Value   Glucose, Bld 108 (*)    BUN 22 (*)    Calcium 8.8 (*)    GFR calc non Af Amer 57 (*)    All other components within normal limits  CBC WITH DIFFERENTIAL/PLATELET - Abnormal; Notable for the following components:   RDW 16.6 (*)    All other components within normal limits  TROPONIN I    Lab work reviewed by me with no acute disease __________________________________________  EKG    ____________________________________________  RADIOLOGY  CT angiogram of the head reviewed by me with no acute disease chest x-ray reviewed by me with no acute disease ____________________________________________   PROCEDURES  Procedure(s) performed: no  Procedures  Critical Care performed: no  Observation: no ____________________________________________   INITIAL IMPRESSION / ASSESSMENT AND PLAN / ED COURSE  Pertinent  labs & imaging results that were available during my care of the patient were reviewed by me and considered in my medical decision making (see chart for details).  Patient arrives neuro intact although with a new headache unlike any other previous headache.  CT angiogram of the head obtained which is fortunately negative for aneurysm or blood.  He feels improved after symptomatic treatment.  She likely has viral syndrome.  We will treat her symptomatically and refer her back to primary care.  She verbalizes understanding and agreement with plan.      ____________________________________________   FINAL CLINICAL IMPRESSION(S) / ED DIAGNOSES  Final  diagnoses:  Other fatigue  Nonintractable headache, unspecified chronicity pattern, unspecified headache type      NEW MEDICATIONS STARTED DURING THIS VISIT:  Discharge Medication List as of 05/21/2017 11:41 AM    START taking these medications   Details  butalbital-acetaminophen-caffeine (FIORICET, ESGIC) 50-325-40 MG tablet Take 1-2 tablets by mouth every 6 (six) hours as needed for headache., Starting Sun 05/21/2017, Until Mon 05/21/2018, Print         Note:  This document was prepared using Dragon voice recognition software and [place] include unintentional dictation errors.     Darel Hong, MD 05/22/17 [phone]

## 2017-05-21 NOTE — ED Notes (Signed)
Pt ambulated independently to restroom

## 2017-05-21 NOTE — ED Triage Notes (Signed)
Pt arrived via EMS from home with reports of left arm tingling feeling like pins and needles and headache for 5 days, pt also states she is unable to breathe deeply.  Per EMS, lungs are clear.  Pt has hx of afib, HTN, and reflux.  Pt currently taking eliquis

## 2017-05-21 NOTE — Discharge Instructions (Addendum)
Fortunately today your blood work and your CT scan were very reassuring.  Please take your headache medication as needed for severe symptoms and follow-up with your primary care physician in 2 days for reexamination.  Return to the emergency department sooner for any concerns.  It was a pleasure to take care of you today, and thank you for coming to our emergency department.  If you have any questions or concerns before leaving please ask the nurse to grab me and I'm more than happy to go through your aftercare instructions again.  If you were prescribed any opioid pain medication today such as Norco, Vicodin, Percocet, morphine, hydrocodone, or oxycodone please make sure you do not drive when you are taking this medication as it can alter your ability to drive safely.  If you have any concerns once you are home that you are not improving or are in fact getting worse before you can make it to your follow-up appointment, please do not hesitate to call 911 and come back for further evaluation.  Darel Hong, MD  Results for orders placed or performed during the hospital encounter of 05/21/17  Comprehensive metabolic panel  Result Value Ref Range   Sodium 141 135 - 145 mmol/L   Potassium 3.9 3.5 - 5.1 mmol/L   Chloride 105 101 - 111 mmol/L   CO2 27 22 - 32 mmol/L   Glucose, Bld 108 (H) 65 - 99 mg/dL   BUN 22 (H) 6 - 20 mg/dL   Creatinine, Ser 0.98 0.44 - 1.00 mg/dL   Calcium 8.8 (L) 8.9 - 10.3 mg/dL   Total Protein 6.7 6.5 - 8.1 g/dL   Albumin 3.5 3.5 - 5.0 g/dL   AST 19 15 - 41 U/L   ALT 17 14 - 54 U/L   Alkaline Phosphatase 73 38 - 126 U/L   Total Bilirubin 0.7 0.3 - 1.2 mg/dL   GFR calc non Af Amer 57 (L) >60 mL/min   GFR calc Af Amer >60 >60 mL/min   Anion gap 9 5 - 15  CBC with Differential  Result Value Ref Range   WBC 5.4 3.6 - 11.0 K/uL   RBC 4.51 3.80 - 5.20 MIL/uL   Hemoglobin 13.4 12.0 - 16.0 g/dL   HCT 39.9 35.0 - 47.0 %   MCV 88.5 80.0 - 100.0 fL   MCH 29.7 26.0 -  34.0 pg   MCHC 33.5 32.0 - 36.0 g/dL   RDW 16.6 (H) 11.5 - 14.5 %   Platelets 235 150 - 440 K/uL   Neutrophils Relative % 68 %   Neutro Abs 3.7 1.4 - 6.5 K/uL   Lymphocytes Relative 21 %   Lymphs Abs 1.1 1.0 - 3.6 K/uL   Monocytes Relative 7 %   Monocytes Absolute 0.4 0.2 - 0.9 K/uL   Eosinophils Relative 3 %   Eosinophils Absolute 0.1 0 - 0.7 K/uL   Basophils Relative 1 %   Basophils Absolute 0.1 0 - 0.1 K/uL  Troponin I  Result Value Ref Range   Troponin I <0.03 <0.03 ng/mL   Ct Angio Head W Or Wo Contrast  Result Date: 05/21/2017 CLINICAL DATA:  Left arm tingling with pins and needles sensation. Intermittent frontal headache for 5 days. EXAM: CT ANGIOGRAPHY HEAD TECHNIQUE: Multidetector CT imaging of the head was performed using the standard protocol during bolus administration of intravenous contrast. Multiplanar CT image reconstructions and MIPs were obtained to evaluate the vascular anatomy. CONTRAST:  75mL ISOVUE-370 IOPAMIDOL (ISOVUE-370) INJECTION 76%  COMPARISON:  Head CT 02/22/2015 FINDINGS: CT HEAD Brain: No evidence of acute infarction, hemorrhage, hydrocephalus, extra-axial collection or mass lesion/mass effect. Vascular: Atherosclerotic calcification. Skull: Negative Sinuses: Negative Orbits: Negative CTA HEAD Anterior circulation: Atherosclerotic calcification on the carotid siphons. No flow limiting stenosis, branch occlusion, or aneurysm. Posterior circulation: Small vertebrobasilar system in the setting of fetal type PCA. Vertebrobasilar arteries are diffusely patent with smooth appearance. Negative for aneurysm. Venous sinuses: Patent.  Dominant right transverse sigmoid system. Anatomic variants: As above Delayed phase: No abnormal intracranial enhancement. IMPRESSION: 1. No acute finding or explanation for symptoms. 2. Mild atherosclerosis.  No flow limiting stenosis. Electronically Signed   By: Monte Fantasia M.D.   On: 05/21/2017 11:07   Dg Chest 2 View  Result Date:  05/21/2017 CLINICAL DATA:  Chest pain EXAM: CHEST  2 VIEW COMPARISON:  09/14/2016 FINDINGS: Heart and mediastinal contours are within normal limits. No focal opacities or effusions. No acute bony abnormality. IMPRESSION: No active cardiopulmonary disease. Electronically Signed   By: Rolm Baptise M.D.   On: 05/21/2017 10:11   US Venous Img Lower Bilateral  Result Date: 04/21/2017 CLINICAL DATA:  Bilateral lower extremity pain and edema. Evaluate for DVT. EXAM: BILATERAL LOWER EXTREMITY VENOUS DOPPLER ULTRASOUND TECHNIQUE: Gray-scale sonography with graded compression, as well as color Doppler and duplex ultrasound were performed to evaluate the lower extremity deep venous systems from the level of the common femoral vein and including the common femoral, femoral, profunda femoral, popliteal and calf veins including the posterior tibial, peroneal and gastrocnemius veins when visible. The superficial great saphenous vein was also interrogated. Spectral Doppler was utilized to evaluate flow at rest and with distal augmentation maneuvers in the common femoral, femoral and popliteal veins. COMPARISON:  None. FINDINGS: RIGHT LOWER EXTREMITY Common Femoral Vein: No evidence of thrombus. Normal compressibility, respiratory phasicity and response to augmentation. Saphenofemoral Junction: No evidence of thrombus. Normal compressibility and flow on color Doppler imaging. Profunda Femoral Vein: No evidence of thrombus. Normal compressibility and flow on color Doppler imaging. Femoral Vein: No evidence of thrombus. Normal compressibility, respiratory phasicity and response to augmentation. Popliteal Vein: No evidence of thrombus. Normal compressibility, respiratory phasicity and response to augmentation. Calf Veins: No evidence of thrombus. Normal compressibility and flow on color Doppler imaging. Superficial Great Saphenous Vein: No evidence of thrombus. Normal compressibility. Venous Reflux:  None. Other Findings: There is  made of an approximately 2.7 x 0.9 x 3.6 cm serpiginous fluid collection within right popliteal fossa there is represent a Baker cyst. LEFT LOWER EXTREMITY Common Femoral Vein: No evidence of thrombus. Normal compressibility, respiratory phasicity and response to augmentation. Saphenofemoral Junction: No evidence of thrombus. Normal compressibility and flow on color Doppler imaging. Profunda Femoral Vein: No evidence of thrombus. Normal compressibility and flow on color Doppler imaging. Femoral Vein: No evidence of thrombus. Normal compressibility, respiratory phasicity and response to augmentation. Popliteal Vein: No evidence of thrombus. Normal compressibility, respiratory phasicity and response to augmentation. Calf Veins: No evidence of thrombus. Normal compressibility and flow on color Doppler imaging. Superficial Great Saphenous Vein: No evidence of thrombus. Normal compressibility. Venous Reflux:  None. Other Findings:  None. IMPRESSION: 1. No evidence of DVT within either lower extremity. 2. Note made of an approximately 3.6 cm right-sided Baker's cyst. Electronically Signed   By: Sandi Mariscal M.D.   On: 04/21/2017 15:46   US Arterial Lower Extremity Duplex Bilateral  Result Date: 04/25/2017 CLINICAL DATA:  Bilateral calf pain for 2 months. EXAM: BILATERAL LOWER EXTREMITY ARTERIAL  DUPLEX SCAN TECHNIQUE: Gray-scale sonography as well as color Doppler and duplex ultrasound was performed to evaluate the arteries of both lower extremities including the common, superficial and profunda femoral arteries, popliteal artery and calf arteries. COMPARISON:  None. FINDINGS: Right Lower Extremity ABI: Not obtained Inflow: Normal common femoral arterial waveforms and velocities. No evidence of inflow (aortoiliac) disease. Outflow: Normal profunda femoral, superficial femoral and popliteal arterial waveforms and velocities. No focal elevation of the PSV to suggest stenosis. Runoff: Normal posterior and anterior tibial  arterial waveforms and velocities. Vessels are patent to the ankle. Left Lower Extremity ABI: Not obtained Inflow: Normal common femoral arterial waveforms and velocities. Echogenic plaque in left common femoral artery without significant stenosis. Outflow: Normal profunda femoral, superficial femoral and popliteal arterial waveforms and velocities. No focal elevation of the PSV to suggest stenosis. Runoff: Normal posterior and anterior tibial arterial waveforms and velocities. Vessels are patent to the ankle. IMPRESSION: Bilateral lower extremity arteries are patent with normal waveforms and velocities. No significant stenosis. Mild atherosclerosis. Electronically Signed   By: Markus Daft M.D.   On: 04/25/2017 08:03

## 2017-06-08 ENCOUNTER — Other Ambulatory Visit
Admission: RE | Admit: 2017-06-08 | Discharge: 2017-06-08 | Disposition: A | Discharge status: Home / Self Care | Payer: Medicare HMO | Source: Ambulatory Visit | Attending: Ophthalmology | Admitting: Ophthalmology

## 2017-06-08 DIAGNOSIS — M316 Other giant cell arteritis: Secondary | ICD-10-CM | POA: Diagnosis present

## 2017-06-08 LAB — CBC WITH DIFFERENTIAL/PLATELET
Basophils Absolute: 0.1 10*3/uL (ref 0–0.1)
Basophils Relative: 1 %
EOS PCT: 1 %
Eosinophils Absolute: 0 10*3/uL (ref 0–0.7)
HCT: 44 % (ref 35.0–47.0)
Hemoglobin: 14.9 g/dL (ref 12.0–16.0)
LYMPHS ABS: 1 10*3/uL (ref 1.0–3.6)
LYMPHS PCT: 13 %
MCH: 29.4 pg (ref 26.0–34.0)
MCHC: 33.8 g/dL (ref 32.0–36.0)
MCV: 87.1 fL (ref 80.0–100.0)
Monocytes Absolute: 0.9 10*3/uL (ref 0.2–0.9)
Monocytes Relative: 11 %
Neutro Abs: 6.3 10*3/uL (ref 1.4–6.5)
Neutrophils Relative %: 76 %
Platelets: 265 10*3/uL (ref 150–440)
RBC: 5.06 MIL/uL (ref 3.80–5.20)
RDW: 16.9 % — ABNORMAL HIGH (ref 11.5–14.5)
WBC: 8.4 10*3/uL (ref 3.6–11.0)

## 2017-06-08 LAB — C-REACTIVE PROTEIN: CRP: 6.2 mg/dL — ABNORMAL HIGH (ref <1.0)

## 2017-06-08 LAB — SEDIMENTATION RATE: Sed Rate: 29 mm/hr (ref 0–30)

## 2017-06-12 ENCOUNTER — Other Ambulatory Visit: Payer: Self-pay

## 2017-06-12 ENCOUNTER — Encounter (INDEPENDENT_AMBULATORY_CARE_PROVIDER_SITE_OTHER): Payer: Self-pay | Admitting: Vascular Surgery

## 2017-06-12 ENCOUNTER — Other Ambulatory Visit (INDEPENDENT_AMBULATORY_CARE_PROVIDER_SITE_OTHER): Payer: Self-pay | Admitting: Vascular Surgery

## 2017-06-12 ENCOUNTER — Encounter
Admission: RE | Admit: 2017-06-12 | Discharge: 2017-06-12 | Disposition: A | Discharge status: Home / Self Care | Payer: Medicare HMO | Source: Ambulatory Visit | Attending: Vascular Surgery | Admitting: Vascular Surgery

## 2017-06-12 ENCOUNTER — Ambulatory Visit (INDEPENDENT_AMBULATORY_CARE_PROVIDER_SITE_OTHER): Payer: Medicare HMO | Admitting: Vascular Surgery

## 2017-06-12 ENCOUNTER — Encounter (INDEPENDENT_AMBULATORY_CARE_PROVIDER_SITE_OTHER): Payer: Self-pay

## 2017-06-12 VITALS — BP 166/99 | HR 76 | Resp 17 | Ht 64.5 in | Wt 299.0 lb

## 2017-06-12 DIAGNOSIS — Z01812 Encounter for preprocedural laboratory examination: Secondary | ICD-10-CM | POA: Diagnosis present

## 2017-06-12 DIAGNOSIS — R51 Headache: Secondary | ICD-10-CM | POA: Diagnosis not present

## 2017-06-12 DIAGNOSIS — R519 Headache, unspecified: Secondary | ICD-10-CM

## 2017-06-12 DIAGNOSIS — H53141 Visual discomfort, right eye: Secondary | ICD-10-CM | POA: Diagnosis not present

## 2017-06-12 DIAGNOSIS — Z01810 Encounter for preprocedural cardiovascular examination: Secondary | ICD-10-CM | POA: Diagnosis present

## 2017-06-12 DIAGNOSIS — M316 Other giant cell arteritis: Secondary | ICD-10-CM | POA: Diagnosis not present

## 2017-06-12 HISTORY — DX: Hypothyroidism, unspecified: E03.9

## 2017-06-12 LAB — BASIC METABOLIC PANEL
ANION GAP: 12 (ref 5–15)
BUN: 24 mg/dL — ABNORMAL HIGH (ref 6–20)
CALCIUM: 8.7 mg/dL — AB (ref 8.9–10.3)
CO2: 25 mmol/L (ref 22–32)
Chloride: 100 mmol/L — ABNORMAL LOW (ref 101–111)
Creatinine, Ser: 0.98 mg/dL (ref 0.44–1.00)
GFR, EST NON AFRICAN AMERICAN: 57 mL/min — AB (ref >60)
GLUCOSE: 102 mg/dL — AB (ref 65–99)
Potassium: 3.5 mmol/L (ref 3.5–5.1)
Sodium: 137 mmol/L (ref 135–145)

## 2017-06-12 LAB — PROTIME-INR
INR: 1
PROTHROMBIN TIME: 13.1 s (ref 11.4–15.2)

## 2017-06-12 LAB — CBC WITH DIFFERENTIAL/PLATELET
BASOS ABS: 0.1 10*3/uL (ref 0–0.1)
BASOS PCT: 1 %
Eosinophils Absolute: 0.1 10*3/uL (ref 0–0.7)
Eosinophils Relative: 2 %
HEMATOCRIT: 40.8 % (ref 35.0–47.0)
Hemoglobin: 13.6 g/dL (ref 12.0–16.0)
Lymphocytes Relative: 21 %
Lymphs Abs: 1.2 10*3/uL (ref 1.0–3.6)
MCH: 29.1 pg (ref 26.0–34.0)
MCHC: 33.2 g/dL (ref 32.0–36.0)
MCV: 87.7 fL (ref 80.0–100.0)
MONO ABS: 0.5 10*3/uL (ref 0.2–0.9)
MONOS PCT: 9 %
NEUTROS ABS: 4 10*3/uL (ref 1.4–6.5)
NEUTROS PCT: 67 %
Platelets: 275 10*3/uL (ref 150–440)
RBC: 4.66 MIL/uL (ref 3.80–5.20)
RDW: 16.8 % — AB (ref 11.5–14.5)
WBC: 5.9 10*3/uL (ref 3.6–11.0)

## 2017-06-12 LAB — TYPE AND SCREEN
ABO/RH(D): O POS
Antibody Screen: NEGATIVE

## 2017-06-12 LAB — APTT: APTT: 27 s (ref 24–36)

## 2017-06-12 NOTE — Progress Notes (Signed)
Subjective:    Patient ID: Patricia Walker, female    DOB: [birth year], 72 y.o.   MRN: [id number] Chief Complaint  Patient presents with  . New Patient (Initial Visit)    ref Dara Lords for temporal biopsy   Presents as a new patient referred by Dr. Dara Lords for possible "temporal arteritis".  The patient endorses progressively worsening headaches over the last "few weeks".  Patient notes her headaches are primarily right-sided.  The patient is also experiencing right eye photophobia.  Both the discomfort associated with her headaches and her photophobia are progressively worsening. The patient states that her vision is worsening bilaterally and was told that she needs glasses.  The patient's blood work is as follows:  06/08/2017 16:36 CRP: 6.2 (H) Sed Rate: 29   The patient denies any fever, nausea vomiting.  The patient is on Eliquis for chronic atrial fibrillation.   Review of Systems  Constitutional: Negative.   HENT: Negative.   Eyes: Positive for photophobia.  Respiratory: Negative.   Cardiovascular: Negative.   Gastrointestinal: Negative.   Endocrine: Negative.   Genitourinary: Negative.   Musculoskeletal: Negative.   Skin: Negative.   Neurological: Positive for headaches.  Hematological: Negative.   Psychiatric/Behavioral: Negative.       Objective:   Physical Exam  Constitutional: She is oriented to person, place, and time. She appears well-developed and well-nourished. No distress.  HENT:  Head: Normocephalic and atraumatic.  Eyes: Conjunctivae are normal. Pupils are equal, round, and reactive to light.  Neck: Normal range of motion.  Cardiovascular: Normal rate, normal heart sounds and intact distal pulses.  Pulses:      Radial pulses are 2+ on the right side, and 2+ on the left side.  Irregularly irregular  Pulmonary/Chest: Effort normal. No respiratory distress. She has no wheezes. She has no rales.  Abdominal: Soft. Bowel sounds are normal. She exhibits no  distension. There is no tenderness. There is no rebound.  Musculoskeletal: Normal range of motion. She exhibits edema (Mild bilateral lower extremity edema).  Neurological: She is alert and oriented to person, place, and time.  Skin: Skin is warm and dry. She is not diaphoretic.  Psychiatric: She has a normal mood and affect. Her behavior is normal. Thought content normal.  Vitals reviewed.  BP (!) 166/99 (BP Location: Right Arm)   Pulse 76   Resp 17   Ht 5' 4.5" (1.638 m)   Wt 299 lb (135.6 kg)   BMI 50.53 kg/m   Past Medical History:  Diagnosis Date  . Coronary artery disease    mild, nonobstructive  . Dysrhythmia    Atrial Fibrillation  . GERD (gastroesophageal reflux disease)   . Hyperlipidemia   . Hypertension   . 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 Morbid obesity ([place])   . Persistent atrial fibrillation ([place])   . Psoriasis   . Sleep apnea    compliant with CPAP  . Thyroid disease    Social History   Socioeconomic History  . Marital status: Divorced    Spouse name: Not on file  . Number of children: Not on file  . Years of education: Not on file  . Highest education level: Not on file  Social Needs  . Financial resource strain: Not on file  . Food insecurity - worry: Not on file  . Food insecurity - inability: Not on file  . Transportation needs - medical: Not on file  . Transportation needs - non-medical: Not on  file  Occupational History  . Not on file  Tobacco Use  . Smoking status: Former Smoker    Types: Cigarettes    Last attempt to quit: 02/01/2013    Years since quitting: 4.3  . Smokeless tobacco: Never Used  Substance and Sexual Activity  . Alcohol use: No  . Drug use: No  . Sexual activity: Not on file  Other Topics Concern  . Not on file  Social History Narrative   Lives in [place] with multiple family members   unemployed   Past Surgical History:  Procedure Laterality Date  . CARDIAC CATHETERIZATION    . CARDIOVERSION Right  09/01/2016   Procedure: Cardioversion;  Surgeon: [name], MD;  Location: [facility] ORS;  Service: Cardiovascular;  Laterality: Right;  . CARDIOVERSION N/A 09/09/2016   Procedure: Cardioversion;  Surgeon: [name], MD;  Location: [facility] ORS;  Service: Cardiovascular;  Laterality: N/A;  . ELECTROPHYSIOLOGIC STUDY N/A 02/01/2016   Procedure: CARDIOVERSION;  Surgeon: [name], MD;  Location: [facility] ORS;  Service: Cardiovascular;  Laterality: N/A;  . ESOPHAGEAL DILATION    . ESOPHAGOGASTRODUODENOSCOPY (EGD) WITH PROPOFOL N/A 02/06/2017   Procedure: ESOPHAGOGASTRODUODENOSCOPY (EGD) WITH PROPOFOL;  Surgeon: [name], MD;  Location: [facility];  Service: Gastroenterology;  Laterality: N/A;  . EUS N/A 02/16/2017   Procedure: FULL UPPER ENDOSCOPIC ULTRASOUND (EUS) RADIAL;  Surgeon: [name], MD;  Location: [facility] ENDOSCOPY;  Service: Gastroenterology;  Laterality: N/A;  . LEFT HEART CATH AND CORONARY ANGIOGRAPHY N/A 09/08/2016   Procedure: Left Heart Cath and Coronary Angiography;  Surgeon: [name], MD;  Location: [place] [facility];  Service: Cardiovascular;  Laterality: N/A;  . US ECHOCARDIOGRAPHY     Family History  Problem Relation Age of Onset  . Breast cancer Paternal Aunt   . Other Father   . Heart attack Father    Allergies  Allergen Reactions  . Codeine Itching  . Other Itching    States antibiotic in the past caused itching but can not remember name      Assessment & Plan:  Presents as a new patient referred by Dr. Dara Lords for possible "temporal arteritis".  The patient endorses progressively worsening headaches over the last "few weeks".  Patient notes her headaches are primarily right-sided.  The patient is also experiencing right eye photophobia.  Both the discomfort associated with her headaches and her photophobia are progressively worsening. The patient states that her vision is worsening bilaterally and was told that she needs glasses.  The patient's  blood work is as follows:  06/08/2017 16:36 CRP: 6.2 (H) Sed Rate: 29   The patient denies any fever, nausea vomiting.  The patient is on Eliquis for chronic atrial fibrillation.  1. Temporal arteritis ([place]) - New Presents as a new patient referred by Dr. Dara Lords Patient has been experiencing worsening right-sided headaches for a few weeks Patient has also been experiencing worsening right sided photophobia Patient with elevated CRP and borderline elevated sed rate Recommend right temporal artery biopsy to rule out temporal arteritis Procedure, risks and benefits explained to the patient All questions answered The patient is willing to proceed  2. New onset of headaches - New As above  3. Photophobia of right eye - New As above  Current Outpatient Medications on File Prior to Visit  Medication Sig Dispense Refill  . acetaminophen (TYLENOL) 325 MG tablet Take 650 mg by mouth 2 (two) times daily as needed for mild pain or headache.    [name] Kitchen  apixaban (ELIQUIS) 5 MG TABS tablet Take 5 mg by mouth 2 (two) times daily.    [name] Kitchen atorvastatin (LIPITOR) 20 MG tablet Take 20 mg by mouth every evening.  1  . azelastine (ASTELIN) 0.1 % nasal spray     . budesonide-formoterol (SYMBICORT) 160-4.5 MCG/ACT inhaler Inhale 2 puffs into the lungs 2 (two) times daily.    . butalbital-acetaminophen-caffeine (FIORICET, ESGIC) 50-325-40 MG tablet Take 1-2 tablets by mouth every 6 (six) hours as needed for headache. 20 tablet 0  . calcium carbonate (TUMS - DOSED IN MG ELEMENTAL CALCIUM) 500 MG chewable tablet Chew 500 mg by mouth daily as needed for indigestion or heartburn.     . cetirizine (ZYRTEC) 10 MG tablet Take 10 mg by mouth as needed.     . Cholecalciferol (VITAMIN D3) 50000 units CAPS Take 50,000 Units by mouth once a week. Monday   1  . clobetasol cream (TEMOVATE) 0.05 %     . cyclobenzaprine (FLEXERIL) 10 MG tablet Take 10 mg by mouth 3 (three) times daily as needed for muscle spasms.    [name] Kitchen diltiazem  (CARDIZEM SR) 60 MG 12 hr capsule Take 60 mg by mouth daily.  1  . esomeprazole (NEXIUM) 40 MG capsule Take 40 mg by mouth daily.     . fluocinolone (SYNALAR) 0.01 % external solution     . furosemide (LASIX) 20 MG tablet Take 20 mg by mouth daily as needed for fluid.  2  . hydrOXYzine (ATARAX/VISTARIL) 25 MG tablet     . ibuprofen (ADVIL,MOTRIN) 100 MG tablet     . Incontinence Supply Disposable (BLADDER CONTROL PAD REGULAR) MISC     . ipratropium (ATROVENT) 0.06 % nasal spray Place 2 sprays into the nose as directed.    [name] Kitchen levothyroxine (SYNTHROID, LEVOTHROID) 50 MCG tablet Take 50 mcg by mouth daily before breakfast.    . neomycin-polymyxin-hydrocortisone (CORTISPORIN) otic solution Place 4 drops into the right ear 4 (four) times daily as needed (psoriasis in ear).    . nystatin (NYSTATIN) powder Apply topically 4 (four) times daily as needed. Irritation under breast and groin area    . Acetaminophen 80 MG TBDP     . ASPIRIN LOW DOSE 81 MG tablet     . BISACODYL LAXATIVE 5 MG EC tablet     . CALCIUM CITRATE-VITAMIN D PO     . cetirizine (ZYRTEC) 10 MG tablet cetirizine 10 mg tablet  TAKE 1 TABLET BY MOUTH EVERY DAY    . Guselkumab (TREMFYA [place]) Inject 1 Dose into the skin every 8 (eight) weeks.    . sucralfate (CARAFATE) 1 GM/10ML suspension Take 10 mLs (1 g total) by mouth 4 (four) times daily. (Patient not taking: Reported on 02/06/2017) 420 mL 1  . TREMFYA 100 MG/ML SOSY Inject 100 mg into the skin every 28 (twenty-eight) days.     No current facility-administered medications on file prior to visit.    There are no Patient Instructions on file for this visit. No Follow-up on file.  Caden Fukushima A Deashia Soule, PA-C

## 2017-06-12 NOTE — Patient Instructions (Signed)
Your procedure is scheduled on: 06/13/17 Report to [place]. At 1:45 pm To find out your arrival time please call [phone] between 1PM - 3PM on .  Remember: Instructions that are not followed completely [place] result in serious medical risk, up to and including death, or upon the discretion of your surgeon and anesthesiologist your surgery [place] need to be rescheduled.     _X__ 1. Do not eat food after midnight the night before your procedure.                 No gum chewing or hard candies. You Demirjian drink clear liquids up to 2 hours                 before you are scheduled to arrive for your surgery- DO not drink clear                 liquids within 2 hours of the start of your surgery.                 Clear Liquids include:  water, apple juice without pulp, clear carbohydrate                 drink such as Clearfast or Gatorade, Black Coffee or Tea (Do not add                 anything to coffee or tea).  __X__2.  On the morning of surgery brush your teeth with toothpaste and water, you                 [place] rinse your mouth with mouthwash if you wish.  Do not swallow any              toothpaste of mouthwash.     _X__ 3.  No Alcohol for 24 hours before or after surgery.   _X__ 4.  Do Not Smoke or use e-cigarettes For 24 Hours Prior to Your Surgery.                 Do not use any chewable tobacco products for at least 6 hours prior to                 surgery.  ____  5.  Bring all medications with you on the day of surgery if instructed.   __X__  6.  Notify your doctor if there is any change in your medical condition      (cold, fever, infections).     Do not wear jewelry, make-up, hairpins, clips or nail polish. Do not wear lotions, powders, or perfumes.  Do not shave 48 hours prior to surgery. Men [place] shave face and neck. Do not bring valuables to the hospital.    [facility] is not responsible for any belongings or  valuables.  Contacts, dentures/partials or body piercings Fringer not be worn into surgery. Bring a case for your contacts, glasses or hearing aids, a denture cup will be supplied. Leave your suitcase in the car. After surgery it Sou be brought to your room. For patients admitted to the hospital, discharge time is determined by your treatment team.   Patients discharged the day of surgery will not be allowed to drive home.   Please read over the following fact sheets that you were given:   MRSA Information  __X__ Take these medicines the morning of surgery with A SIP OF WATER:  1. nexium  2. levothyroxine  3.   4.  5.  6.  ____ Fleet Enema (as directed)   ____ Use CHG Soap/SAGE wipes as directed  ____ Use inhalers on the day of surgery  ____ Stop metformin/Janumet/Farxiga 2 days prior to surgery    ____ Take 1/2 of usual insulin dose the night before surgery. No insulin the morning          of surgery.   ____ Stop Blood Thinners Coumadin/Plavix/Xarelto/Pleta/Pradaxa/Eliquis/Effient/Aspirin  on   Or contact your Surgeon, Cardiologist or Medical Doctor regarding  ability to stop your blood thinners  __X__ Stop Anti-inflammatories 7 days before surgery such as Advil, Ibuprofen, Motrin,  BC or Goodies Powder, Naprosyn, Naproxen, Aleve, Aspirin [place] USE TYLENOL   __X__ Stop all herbal supplements, fish oil or vitamin E until after surgery.    __X__ Bring C-Pap to the hospital.

## 2017-06-13 ENCOUNTER — Ambulatory Visit: Admission: RE | Admit: 2017-06-13 | Payer: Medicare HMO | Source: Ambulatory Visit | Admitting: Vascular Surgery

## 2017-06-13 ENCOUNTER — Encounter: Admission: RE | Payer: Self-pay | Source: Ambulatory Visit

## 2017-06-13 DIAGNOSIS — I200 Unstable angina: Secondary | ICD-10-CM | POA: Insufficient documentation

## 2017-06-13 SURGERY — BIOPSY TEMPORAL ARTERY
Anesthesia: General | Laterality: Right

## 2017-06-13 NOTE — Pre-Procedure Instructions (Signed)
REQUEST FOR CLEARANCE RE EKG / PRODUCTIVE COUGH REFAXED TO DR Wolfgang Phoenix

## 2017-06-20 ENCOUNTER — Encounter: Admission: RE | Payer: Self-pay | Source: Ambulatory Visit

## 2017-06-20 ENCOUNTER — Ambulatory Visit: Admission: RE | Admit: 2017-06-20 | Payer: Medicare HMO | Source: Ambulatory Visit | Admitting: Cardiovascular Disease

## 2017-06-20 SURGERY — LEFT HEART CATH AND CORONARY ANGIOGRAPHY
Anesthesia: Moderate Sedation

## 2017-07-12 ENCOUNTER — Encounter (INDEPENDENT_AMBULATORY_CARE_PROVIDER_SITE_OTHER): Payer: Self-pay

## 2017-07-17 ENCOUNTER — Other Ambulatory Visit (INDEPENDENT_AMBULATORY_CARE_PROVIDER_SITE_OTHER): Payer: Self-pay | Admitting: Vascular Surgery

## 2017-07-20 MED ORDER — CEFAZOLIN SODIUM-DEXTROSE 2-4 GM/100ML-% IV SOLN
2.0000 g | INTRAVENOUS | Status: AC
  Administered 2017-07-21: 2 g via INTRAVENOUS
  Filled 2017-07-20: qty 100

## 2017-07-21 ENCOUNTER — Other Ambulatory Visit: Payer: Self-pay

## 2017-07-21 ENCOUNTER — Ambulatory Visit: Payer: Medicare HMO | Admitting: Certified Registered"

## 2017-07-21 ENCOUNTER — Ambulatory Visit: Payer: Medicare HMO

## 2017-07-21 ENCOUNTER — Encounter
Admission: RE | Disposition: A | Discharge status: Home / Self Care | Payer: Self-pay | Source: Ambulatory Visit | Attending: Vascular Surgery

## 2017-07-21 ENCOUNTER — Ambulatory Visit
Admission: RE | Admit: 2017-07-21 | Discharge: 2017-07-21 | Disposition: A | Discharge status: Home / Self Care | Payer: Medicare HMO | Source: Ambulatory Visit | Attending: Vascular Surgery | Admitting: Vascular Surgery

## 2017-07-21 ENCOUNTER — Encounter: Payer: Self-pay | Admitting: *Deleted

## 2017-07-21 DIAGNOSIS — H53143 Visual discomfort, bilateral: Secondary | ICD-10-CM | POA: Insufficient documentation

## 2017-07-21 DIAGNOSIS — I10 Essential (primary) hypertension: Secondary | ICD-10-CM | POA: Insufficient documentation

## 2017-07-21 DIAGNOSIS — H53149 Visual discomfort, unspecified: Secondary | ICD-10-CM

## 2017-07-21 DIAGNOSIS — Z7951 Long term (current) use of inhaled steroids: Secondary | ICD-10-CM | POA: Diagnosis not present

## 2017-07-21 DIAGNOSIS — Z79899 Other long term (current) drug therapy: Secondary | ICD-10-CM | POA: Insufficient documentation

## 2017-07-21 DIAGNOSIS — Z6841 Body Mass Index (BMI) 40.0 and over, adult: Secondary | ICD-10-CM | POA: Insufficient documentation

## 2017-07-21 DIAGNOSIS — E039 Hypothyroidism, unspecified: Secondary | ICD-10-CM | POA: Insufficient documentation

## 2017-07-21 DIAGNOSIS — K219 Gastro-esophageal reflux disease without esophagitis: Secondary | ICD-10-CM | POA: Diagnosis not present

## 2017-07-21 DIAGNOSIS — I252 Old myocardial infarction: Secondary | ICD-10-CM | POA: Insufficient documentation

## 2017-07-21 DIAGNOSIS — Z79890 Hormone replacement therapy (postmenopausal): Secondary | ICD-10-CM | POA: Diagnosis not present

## 2017-07-21 DIAGNOSIS — G4730 Sleep apnea, unspecified: Secondary | ICD-10-CM | POA: Diagnosis not present

## 2017-07-21 DIAGNOSIS — E785 Hyperlipidemia, unspecified: Secondary | ICD-10-CM | POA: Insufficient documentation

## 2017-07-21 DIAGNOSIS — I482 Chronic atrial fibrillation: Secondary | ICD-10-CM | POA: Diagnosis not present

## 2017-07-21 DIAGNOSIS — Z7901 Long term (current) use of anticoagulants: Secondary | ICD-10-CM | POA: Insufficient documentation

## 2017-07-21 DIAGNOSIS — Z87891 Personal history of nicotine dependence: Secondary | ICD-10-CM | POA: Diagnosis not present

## 2017-07-21 DIAGNOSIS — I2510 Atherosclerotic heart disease of native coronary artery without angina pectoris: Secondary | ICD-10-CM | POA: Diagnosis not present

## 2017-07-21 DIAGNOSIS — Z885 Allergy status to narcotic agent status: Secondary | ICD-10-CM | POA: Diagnosis not present

## 2017-07-21 DIAGNOSIS — J449 Chronic obstructive pulmonary disease, unspecified: Secondary | ICD-10-CM | POA: Diagnosis not present

## 2017-07-21 DIAGNOSIS — R0602 Shortness of breath: Secondary | ICD-10-CM

## 2017-07-21 DIAGNOSIS — M316 Other giant cell arteritis: Secondary | ICD-10-CM | POA: Diagnosis not present

## 2017-07-21 DIAGNOSIS — R51 Headache: Secondary | ICD-10-CM | POA: Diagnosis present

## 2017-07-21 HISTORY — PX: ARTERY BIOPSY: SHX891

## 2017-07-21 LAB — BASIC METABOLIC PANEL
Anion gap: 6 (ref 5–15)
BUN: 17 mg/dL (ref 6–20)
CHLORIDE: 106 mmol/L (ref 101–111)
CO2: 25 mmol/L (ref 22–32)
Calcium: 8.5 mg/dL — ABNORMAL LOW (ref 8.9–10.3)
Creatinine, Ser: 0.85 mg/dL (ref 0.44–1.00)
GFR calc Af Amer: >60 mL/min (ref >60)
GFR calc non Af Amer: >60 mL/min (ref >60)
Glucose, Bld: 113 mg/dL — ABNORMAL HIGH (ref 65–99)
POTASSIUM: 3.9 mmol/L (ref 3.5–5.1)
Sodium: 137 mmol/L (ref 135–145)

## 2017-07-21 LAB — CBC WITH DIFFERENTIAL/PLATELET
Basophils Absolute: 0.1 10*3/uL (ref 0–0.1)
Basophils Relative: 1 %
EOS PCT: 3 %
Eosinophils Absolute: 0.1 10*3/uL (ref 0–0.7)
HEMATOCRIT: 38.1 % (ref 35.0–47.0)
Hemoglobin: 12.9 g/dL (ref 12.0–16.0)
LYMPHS ABS: 1.1 10*3/uL (ref 1.0–3.6)
LYMPHS PCT: 22 %
MCH: 29.8 pg (ref 26.0–34.0)
MCHC: 33.9 g/dL (ref 32.0–36.0)
MCV: 87.7 fL (ref 80.0–100.0)
Monocytes Absolute: 0.4 10*3/uL (ref 0.2–0.9)
Monocytes Relative: 7 %
Neutro Abs: 3.5 10*3/uL (ref 1.4–6.5)
Neutrophils Relative %: 67 %
PLATELETS: 285 10*3/uL (ref 150–440)
RBC: 4.34 MIL/uL (ref 3.80–5.20)
RDW: 15.4 % — AB (ref 11.5–14.5)
WBC: 5.1 10*3/uL (ref 3.6–11.0)

## 2017-07-21 LAB — PROTIME-INR
INR: 0.98
Prothrombin Time: 12.9 seconds (ref 11.4–15.2)

## 2017-07-21 LAB — APTT: aPTT: 28 seconds (ref 24–36)

## 2017-07-21 LAB — TYPE AND SCREEN
ABO/RH(D): O POS
ANTIBODY SCREEN: NEGATIVE

## 2017-07-21 SURGERY — BIOPSY TEMPORAL ARTERY
Anesthesia: General | Laterality: Right | Wound class: Clean

## 2017-07-21 MED ORDER — IPRATROPIUM-ALBUTEROL 0.5-2.5 (3) MG/3ML IN SOLN
RESPIRATORY_TRACT | Status: AC
  Filled 2017-07-21: qty 3

## 2017-07-21 MED ORDER — PROPOFOL 500 MG/50ML IV EMUL
INTRAVENOUS | Status: AC
  Filled 2017-07-21: qty 50

## 2017-07-21 MED ORDER — LIDOCAINE HCL (PF) 2 % IJ SOLN
INTRAMUSCULAR | Status: AC
  Filled 2017-07-21: qty 10

## 2017-07-21 MED ORDER — LIDOCAINE HCL (CARDIAC) 20 MG/ML IV SOLN
INTRAVENOUS | Status: DC | PRN
  Administered 2017-07-21: 80 mg via INTRAVENOUS

## 2017-07-21 MED ORDER — ONDANSETRON HCL 4 MG/2ML IJ SOLN
4.0000 mg | Freq: Once | INTRAMUSCULAR | Status: AC | PRN
  Administered 2017-07-21: 4 mg via INTRAVENOUS

## 2017-07-21 MED ORDER — GLYCOPYRROLATE 0.2 MG/ML IJ SOLN
INTRAMUSCULAR | Status: AC
  Filled 2017-07-21: qty 1

## 2017-07-21 MED ORDER — SUCCINYLCHOLINE CHLORIDE 20 MG/ML IJ SOLN
INTRAMUSCULAR | Status: DC | PRN
  Administered 2017-07-21: 100 mg via INTRAVENOUS

## 2017-07-21 MED ORDER — BUPIVACAINE HCL (PF) 0.5 % IJ SOLN
INTRAMUSCULAR | Status: AC
  Filled 2017-07-21: qty 30

## 2017-07-21 MED ORDER — PROPOFOL 500 MG/50ML IV EMUL
INTRAVENOUS | Status: DC | PRN
  Administered 2017-07-21: 75 ug/kg/min via INTRAVENOUS

## 2017-07-21 MED ORDER — ONDANSETRON HCL 4 MG/2ML IJ SOLN
INTRAMUSCULAR | Status: DC | PRN
  Administered 2017-07-21: 4 mg via INTRAVENOUS

## 2017-07-21 MED ORDER — CHLORHEXIDINE GLUCONATE CLOTH 2 % EX PADS: 6.0000 | MEDICATED_PAD | Freq: Once | CUTANEOUS | Status: DC

## 2017-07-21 MED ORDER — LACTATED RINGERS IV SOLN
Freq: Once | INTRAVENOUS | Status: AC
  Administered 2017-07-21: 10:00:00 via INTRAVENOUS

## 2017-07-21 MED ORDER — ONDANSETRON HCL 4 MG/2ML IJ SOLN
INTRAMUSCULAR | Status: AC
  Filled 2017-07-21: qty 2

## 2017-07-21 MED ORDER — BUPIVACAINE HCL (PF) 0.5 % IJ SOLN
INTRAMUSCULAR | Status: DC | PRN
  Administered 2017-07-21: 6 mL

## 2017-07-21 MED ORDER — LIDOCAINE-EPINEPHRINE 1 %-1:100000 IJ SOLN
INTRAMUSCULAR | Status: DC | PRN
  Administered 2017-07-21: 12 mL

## 2017-07-21 MED ORDER — FENTANYL CITRATE (PF) 100 MCG/2ML IJ SOLN
INTRAMUSCULAR | Status: DC | PRN
  Administered 2017-07-21 (×2): 25 ug via INTRAVENOUS

## 2017-07-21 MED ORDER — PROPOFOL 10 MG/ML IV BOLUS
INTRAVENOUS | Status: DC | PRN
  Administered 2017-07-21: 100 mg via INTRAVENOUS
  Administered 2017-07-21 (×2): 50 mg via INTRAVENOUS

## 2017-07-21 MED ORDER — HYDROCODONE-ACETAMINOPHEN 5-325 MG PO TABS: 1.0000 | ORAL_TABLET | Freq: Four times a day (QID) | ORAL | 0 refills | Status: DC | PRN

## 2017-07-21 MED ORDER — CEFAZOLIN SODIUM-DEXTROSE 2-3 GM-%(50ML) IV SOLR
INTRAVENOUS | Status: AC
  Filled 2017-07-21: qty 50

## 2017-07-21 MED ORDER — LIDOCAINE-EPINEPHRINE 1 %-1:100000 IJ SOLN
INTRAMUSCULAR | Status: AC
  Filled 2017-07-21: qty 1

## 2017-07-21 MED ORDER — FENTANYL CITRATE (PF) 100 MCG/2ML IJ SOLN: 25.0000 ug | INTRAMUSCULAR | Status: DC | PRN

## 2017-07-21 MED ORDER — IPRATROPIUM-ALBUTEROL 0.5-2.5 (3) MG/3ML IN SOLN
3.0000 mL | RESPIRATORY_TRACT | Status: DC
  Administered 2017-07-21: 3 mL via RESPIRATORY_TRACT

## 2017-07-21 MED ORDER — FENTANYL CITRATE (PF) 100 MCG/2ML IJ SOLN
INTRAMUSCULAR | Status: AC
  Filled 2017-07-21: qty 2

## 2017-07-21 SURGICAL SUPPLY — 37 items
BLADE CLIPPER SURG (BLADE) ×3 IMPLANT
BLADE SURG 15 STRL LF DISP TIS (BLADE) ×1 IMPLANT
BLADE SURG 15 STRL SS (BLADE) ×2
CNTNR SPEC 2.5X3XGRAD LEK (MISCELLANEOUS)
CONT SPEC 4OZ STER OR WHT (MISCELLANEOUS)
CONTAINER SPEC 2.5X3XGRAD LEK (MISCELLANEOUS) IMPLANT
COTTON BALL STRL MEDIUM (GAUZE/BANDAGES/DRESSINGS) ×3 IMPLANT
DERMABOND ADVANCED (GAUZE/BANDAGES/DRESSINGS) ×2
DERMABOND ADVANCED .7 DNX12 (GAUZE/BANDAGES/DRESSINGS) ×1 IMPLANT
DRAPE LAPAROTOMY 77X122 PED (DRAPES) ×3 IMPLANT
DRSG TELFA 4X3 1S NADH ST (GAUZE/BANDAGES/DRESSINGS) ×3 IMPLANT
ELECT CAUTERY BLADE 6.4 (BLADE) ×3 IMPLANT
ELECT REM PT RETURN 9FT ADLT (ELECTROSURGICAL) ×3
ELECTRODE REM PT RTRN 9FT ADLT (ELECTROSURGICAL) ×1 IMPLANT
GLOVE BIO SURGEON STRL SZ7 (GLOVE) ×3 IMPLANT
GLOVE INDICATOR 7.5 STRL GRN (GLOVE) ×3 IMPLANT
GLOVE SURG SYN 8.0 (GLOVE) ×3 IMPLANT
GOWN L4 XLG 20 PK N/S (GOWN DISPOSABLE) ×3 IMPLANT
GOWN STRL REUS W/ TWL LRG LVL3 (GOWN DISPOSABLE) ×2 IMPLANT
GOWN STRL REUS W/TWL LRG LVL3 (GOWN DISPOSABLE) ×4
LABEL OR SOLS (LABEL) ×3 IMPLANT
NEEDLE HYPO 25X1 1.5 SAFETY (NEEDLE) ×6 IMPLANT
NS IRRIG 500ML POUR BTL (IV SOLUTION) ×3 IMPLANT
PACK BASIN MINOR ARMC (MISCELLANEOUS) ×3 IMPLANT
SOL PREP PVP 2OZ (MISCELLANEOUS) ×3
SOLUTION PREP PVP 2OZ (MISCELLANEOUS) ×1 IMPLANT
SUCTION FRAZIER HANDLE 10FR (MISCELLANEOUS) ×2
SUCTION TUBE FRAZIER 10FR DISP (MISCELLANEOUS) ×1 IMPLANT
SUT MNCRL AB 4-0 PS2 18 (SUTURE) ×3 IMPLANT
SUT SILK 3 0 (SUTURE) ×2
SUT SILK 3-0 18XBRD TIE 12 (SUTURE) ×1 IMPLANT
SUT SILK 4 0 (SUTURE) ×2
SUT SILK 4-0 18XBRD TIE 12 (SUTURE) ×1 IMPLANT
SUT VIC AB 3-0 SH 27 (SUTURE) ×2
SUT VIC AB 3-0 SH 27X BRD (SUTURE) ×1 IMPLANT
SYR 10ML LL (SYRINGE) ×6 IMPLANT
SYR BULB IRRIG 60ML STRL (SYRINGE) ×3 IMPLANT

## 2017-07-21 NOTE — Anesthesia Preprocedure Evaluation (Signed)
Anesthesia Evaluation  Patient identified by MRN, date of birth, ID band Patient awake    Reviewed: Allergy & Precautions, NPO status , Patient's Chart, lab work & pertinent test results  History of Anesthesia Complications Negative for: history of anesthetic complications  Airway Mallampati: III       Dental  (+) Edentulous Upper, Upper Dentures, Missing, Poor Dentition   Pulmonary shortness of breath and with exertion, sleep apnea and Continuous Positive Airway Pressure Ventilation , COPD,  COPD inhaler, neg recent URI, former smoker,           Cardiovascular hypertension, Pt. on medications (-) angina+ CAD, + Past MI and + Peripheral Vascular Disease  (-) Cardiac Stents, (-) CABG and (-) CHF + dysrhythmias Atrial Fibrillation (-) Valvular Problems/Murmurs     Neuro/Psych neg Seizures negative neurological ROS     GI/Hepatic Neg liver ROS, GERD  Medicated,  Endo/Other  neg diabetesMorbid obesity  Renal/GU negative Renal ROS     Musculoskeletal   Abdominal   Peds  Hematology   Anesthesia Other Findings Past Medical History: No date: Coronary artery disease     Comment:  mild, nonobstructive No date: Dysrhythmia     Comment:  Atrial Fibrillation No date: GERD (gastroesophageal reflux disease) No date: Hyperlipidemia No date: Hypertension No date: Hypothyroidism No date: MI, old     Comment:  nonobstructive CAD by Cath No date: Morbid obesity ([place]) No date: Persistent atrial fibrillation ([place]) No date: Psoriasis No date: Sleep apnea     Comment:  compliant with CPAP No date: Thyroid disease   Reproductive/Obstetrics                             Anesthesia Physical  Anesthesia Plan  ASA: III  Anesthesia Plan: General   Post-op Pain Management:    Induction: Intravenous  PONV Risk Score and Plan: 3 and Ondansetron, Dexamethasone and Propofol infusion  Airway Management  Planned: Simple Face Mask  Additional Equipment:   Intra-op Plan:   Post-operative Plan:   Informed Consent: I have reviewed the patients History and Physical, chart, labs and discussed the procedure including the risks, benefits and alternatives for the proposed anesthesia with the patient or authorized representative who has indicated his/her understanding and acceptance.     Plan Discussed with:   Anesthesia Plan Comments:         Anesthesia Quick Evaluation

## 2017-07-21 NOTE — OR Nursing (Signed)
Patient resting now coughing has slowed down

## 2017-07-21 NOTE — OR Nursing (Signed)
Patient c/o upper chest discomfort patient has coughed up a good amount of yellow sputum anesthesia oredered a chest x-ray

## 2017-07-21 NOTE — Op Note (Signed)
        OPERATIVE NOTE   PRE-OPERATIVE DIAGNOSIS: suspected temporal arteritis, right-sided headache; visual disturbance  POST-OPERATIVE DIAGNOSIS: Same as above  PROCEDURE: 1.   Right temporal artery biopsy  SURGEON: Hortencia Pilar, MD  ASSISTANT(S): Ms. Hezzie Bump  ANESTHESIA: General by LMA  ESTIMATED BLOOD LOSS: Minimal  FINDING(S): 1.  none  SPECIMEN(S):  Right  superficial temporal artery sent to pathology  INDICATIONS:   Patient is a 72 y.o. female who presents with worsening headaches associated with visual changes and elevated inflammatory markers. We were consulted for consideration for temporal artery biopsy. Risks and benefits were discussed and he was agreeable to proceed.  DESCRIPTION: After obtaining full informed written consent, the patient was brought back to the operating room and placed supine upon the operating table.  The patient received IV antibiotics prior to induction.  After obtaining adequate anesthesia, the patient was prepped and draped in the standard fashion. The area in front of his right ear was anesthetized copiously with a solution of 1% lidocaine with epinephrine and half percent Marcaine without epinephrine. I then made an incision just in front of the right ear overlying the palpable pulse. I then dissected down through the subcutaneous tissues and identified the superficial temporal artery. This was dissected out over a several centimeters and branches were ligated and divided between silk ties. Care was used to avoid electrocautery around the artery. I then clamped the artery proximally and distally and transected the artery. The specimen was then sent to pathology. The proximal and distal artery were ligated with 3-0 silk ties. Hemostasis was achieved. The wound was then closed with a series of interrupted 3-0 Vicryl's and the skin was closed with a 4-0 Monocryl. Sterile dressing was placed. The patient was taken to the recovery room in stable  condition having tolerated the procedure well.  COMPLICATIONS: None  CONDITION: Stable   Hortencia Pilar 07/21/2017 12:04 PM  This note was created with Dragon Medical transcription system. Any errors in dictation are purely unintentional.

## 2017-07-21 NOTE — OR Nursing (Signed)
IV d/c'd postop, gauze/paper tape applied

## 2017-07-21 NOTE — Anesthesia Procedure Notes (Signed)

## 2017-07-21 NOTE — H&P (Signed)
[facility] Admission History & Physical  MRN : [id number]  Patricia Walker is a 72 y.o. (1945/07/25) female who presents with chief complaint of here to get a piece of artery removed.  History of Present Illness:  The patient was referred by Dr. Dara Lords for possible "temporal arteritis".  The patient endorses progressively worsening headaches over the last "few weeks".  These have continued.  Patient still notes her headaches are primarily right-sided.  The patient is also experiencing bilateral eye photophobia.  Both the discomfort associated with her headaches and her photophobia are progressively worsening. The patient states that her vision is worsening bilaterally and was told that she needs glasses.  The patient's blood work is as follows:  06/08/2017 16:36 CRP: 6.2 (H) Sed Rate: 29   The patient denies any fever, nausea vomiting.  The patient is on Eliquis for chronic atrial fibrillation.    Current Facility-Administered Medications  Medication Dose Route Frequency Provider Last Rate Last Dose  . ceFAZolin (ANCEF) IVPB 2g/100 mL premix  2 g Intravenous On Call to Black[place], PA-C      . Chlorhexidine Gluconate Cloth 2 % PADS 6 each  6 each Topical Once Stegmayer, Kimberly A, PA-C       And  . Chlorhexidine Gluconate Cloth 2 % PADS 6 each  6 each Topical Once Stegmayer, Janalyn Harder, PA-C        Past Medical History:  Diagnosis Date  . Coronary artery disease    mild, nonobstructive  . Dysrhythmia    Atrial Fibrillation  . GERD (gastroesophageal reflux disease)   . Hyperlipidemia   . Hypertension   . Hypothyroidism   . 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 Morbid obesity ([place])   . Persistent atrial fibrillation ([place])   . Psoriasis   . Sleep apnea    compliant with CPAP  . Thyroid disease     Past Surgical History:  Procedure Laterality Date  . CARDIAC CATHETERIZATION    . CARDIOVERSION Right 09/01/2016   Procedure:  Cardioversion;  Surgeon: [name], MD;  Location: [facility] ORS;  Service: Cardiovascular;  Laterality: Right;  . CARDIOVERSION N/A 09/09/2016   Procedure: Cardioversion;  Surgeon: [name], MD;  Location: [facility] ORS;  Service: Cardiovascular;  Laterality: N/A;  . ELECTROPHYSIOLOGIC STUDY N/A 02/01/2016   Procedure: CARDIOVERSION;  Surgeon: [name], MD;  Location: [facility] ORS;  Service: Cardiovascular;  Laterality: N/A;  . ESOPHAGEAL DILATION    . ESOPHAGOGASTRODUODENOSCOPY (EGD) WITH PROPOFOL N/A 02/06/2017   Procedure: ESOPHAGOGASTRODUODENOSCOPY (EGD) WITH PROPOFOL;  Surgeon: [name], MD;  Location: [facility] ENDOSCOPY;  Service: Gastroenterology;  Laterality: N/A;  . EUS N/A 02/16/2017   Procedure: FULL UPPER ENDOSCOPIC ULTRASOUND (EUS) RADIAL;  Surgeon: [name], MD;  Location: [facility] ENDOSCOPY;  Service: Gastroenterology;  Laterality: N/A;  . LEFT HEART CATH AND CORONARY ANGIOGRAPHY N/A 09/08/2016   Procedure: Left Heart Cath and Coronary Angiography;  Surgeon: [name], MD;  Location: [facility];  Service: Cardiovascular;  Laterality: N/A;  . US ECHOCARDIOGRAPHY      Social History Social History   Tobacco Use  . Smoking status: Former Smoker    Types: Cigarettes    Last attempt to quit: 02/01/2013    Years since quitting: 4.4  . Smokeless tobacco: Never Used  Substance Use Topics  . Alcohol use: No  . Drug use: No    Family History Family History  Problem  Relation Age of Onset  . Breast cancer Paternal Aunt   . Other Father   . Heart attack Father   No family history of bleeding/clotting disorders, porphyria or autoimmune disease   Allergies  Allergen Reactions  . Codeine Itching  . Other Itching and Other (See Comments)    States antibiotic in the past caused itching but can not remember name     REVIEW OF SYSTEMS (Negative unless checked)  Constitutional: [] Weight loss  [] Fever  [] Chills Cardiac: [] Chest pain   [] Chest pressure    [] Palpitations   [] Shortness of breath when laying flat   [] Shortness of breath at rest   [] Shortness of breath with exertion. Vascular:  [] Pain in legs with walking   [] Pain in legs at rest   [] Pain in legs when laying flat   [] Claudication   [] Pain in feet when walking  [] Pain in feet at rest  [] Pain in feet when laying flat   [] History of DVT   [] Phlebitis   [] Swelling in legs   [] Varicose veins   [] Non-healing ulcers Pulmonary:   [] Uses home oxygen   [] Productive cough   [] Hemoptysis   [] Wheeze  [] COPD   [] Asthma Neurologic:  [] Dizziness  [] Blackouts   [] Seizures   [] History of stroke   [] History of TIA  [] Aphasia   [] Temporary blindness   [] Dysphagia   [] Weakness or numbness in arms   [] Weakness or numbness in legs Musculoskeletal:  [] Arthritis   [] Joint swelling   [] Joint pain   [] Low back pain Hematologic:  [] Easy bruising  [] Easy bleeding   [] Hypercoagulable state   [] Anemic  [] Hepatitis Gastrointestinal:  [] Blood in stool   [] Vomiting blood  [] Gastroesophageal reflux/heartburn   [] Difficulty swallowing. Genitourinary:  [] Chronic kidney disease   [] Difficult urination  [] Frequent urination  [] Burning with urination   [] Blood in urine Skin:  [] Rashes   [] Ulcers   [] Wounds Psychological:  [] History of anxiety   []  History of major depression.  Physical Examination  Vitals:   07/21/17 0944  BP: (!) 145/91  Pulse: 77  Resp: 16  Temp: (!) 97 F (36.1 C)  TempSrc: Temporal  SpO2: 99%  Weight: 136.1 kg (300 lb)  Height: 5\' 4"  (1.626 m)   Body mass index is 51.49 kg/m. Gen: WD/WN, NAD Head: [place]/AT, No temporalis wasting. Prominent temp pulse not noted. Ear/Nose/Throat: Hearing grossly intact, nares w/o erythema or drainage, oropharynx w/o Erythema/Exudate,  Eyes: Conjunctiva clear, sclera non-icteric Neck: Trachea midline.  No JVD.  Pulmonary:  Good air movement, respirations not labored, no use of accessory muscles.  Cardiac: RRR, normal S1, S2. Vascular:  Vessel Right Left   Radial Palpable Palpable  Gastrointestinal: soft, non-tender/non-distended. No guarding/reflex.  Musculoskeletal: M/S 5/5 throughout.  Extremities without ischemic changes.  No deformity or atrophy.  Neurologic: Sensation grossly intact in extremities.  Symmetrical.  Speech is fluent. Motor exam as listed above. Psychiatric: Judgment intact, Mood & affect appropriate for pt's clinical situation. Dermatologic: No rashes or ulcers noted.  No cellulitis or open wounds. Lymph : No Cervical, Axillary, or Inguinal lymphadenopathy.     CBC Lab Results  Component Value Date   WBC 5.1 07/21/2017   HGB 12.9 07/21/2017   HCT 38.1 07/21/2017   MCV 87.7 07/21/2017   PLT 285 07/21/2017    BMET    Component Value Date/Time   NA 137 06/12/2017 1554   NA 140 04/18/2014 1216   K 3.5 06/12/2017 1554   K 3.9 04/18/2014 1216   CL 100 (L) 06/12/2017  1554   CL 108 (H) 04/18/2014 1216   CO2 25 06/12/2017 1554   CO2 24 04/18/2014 1216   GLUCOSE 102 (H) 06/12/2017 1554   GLUCOSE 94 04/18/2014 1216   BUN 24 (H) 06/12/2017 1554   BUN 16 04/18/2014 1216   CREATININE 0.98 06/12/2017 1554   CREATININE 0.79 04/18/2014 1216   CALCIUM 8.7 (L) 06/12/2017 1554   CALCIUM 8.4 (L) 04/18/2014 1216   GFRNONAA 57 (L) 06/12/2017 1554   GFRNONAA >60 04/18/2014 1216   GFRNONAA 53 (L) 07/13/2012 1542   GFRAA >60 06/12/2017 1554   GFRAA >60 04/18/2014 1216   GFRAA >60 07/13/2012 1542   CrCl cannot be calculated (Patient's most recent lab result is older than the maximum 21 days allowed.).  COAG Lab Results  Component Value Date   INR 1.00 06/12/2017   INR 0.97 09/08/2016   INR 0.9 04/18/2014    Radiology No results found.  Assessment/Plan 1. Temporal arteritis ([place]) - New Presents as a new patient referred by Dr. Dara Lords Patient has been experiencing worsening right-sided headaches for weeks Patient has also been experiencing worsening bilateral photophobia Patient with elevated CRP and  borderline elevated sed rate Recommend right temporal artery biopsy to rule out temporal arteritis Procedure, risks and benefits explained to the patient All questions answered The patient is willing to proceed  2. New onset of headaches - New Her headaches have continued and therefore we will move forward with temporal artery biopsy.  3. Photophobia of both eyes - New Patient notes her photophobia has worsened and is now including the left as well as the right eye.  See above for plan    Hortencia Pilar, MD  07/21/2017 10:57 AM

## 2017-07-21 NOTE — Discharge Instructions (Signed)
AMBULATORY SURGERY  °DISCHARGE INSTRUCTIONS ° ° °1) The drugs that you were given will stay in your system until tomorrow so for the next 24 hours you should not: ° °A) Drive an automobile °B) Make any legal decisions °C) Drink any alcoholic beverage ° ° °2) You [place] resume regular meals tomorrow.  Today it is better to start with liquids and gradually work up to solid foods. ° °You [place] eat anything you prefer, but it is better to start with liquids, then soup and crackers, and gradually work up to solid foods. ° ° °3) Please notify your doctor immediately if you have any unusual bleeding, trouble breathing, redness and pain at the surgery site, drainage, fever, or pain not relieved by medication. ° ° ° °4) Additional Instructions: ° ° ° ° ° ° ° °Please contact your physician with any problems or Same Day Surgery at [phone], Monday through Friday 6 am to 4 pm, or [place] [facility] [place] Main number at [phone]. °

## 2017-07-21 NOTE — Anesthesia Postprocedure Evaluation (Signed)
Anesthesia Post Note  Patient: Patricia Walker  Procedure(s) Performed: BIOPSY TEMPORAL ARTERY (Right )  Patient location during evaluation: PACU Anesthesia Type: General Level of consciousness: awake and alert Pain management: pain level controlled Vital Signs Assessment: post-procedure vital signs reviewed and stable Respiratory status: spontaneous breathing, nonlabored ventilation, respiratory function stable and patient connected to nasal cannula oxygen Cardiovascular status: blood pressure returned to baseline and stable Postop Assessment: no apparent nausea or vomiting Anesthetic complications: no     Last Vitals:  Vitals:   07/21/17 1355 07/21/17 1420  BP: (!) 166/86 136/89  Pulse: 89 80  Resp: 18 18  Temp: 37.1 C   SpO2: 95% 98%    Last Pain:  Vitals:   07/21/17 1420  TempSrc:   PainSc: 5                  Martha Clan

## 2017-07-21 NOTE — Anesthesia Post-op Follow-up Note (Signed)
Anesthesia QCDR form completed.        

## 2017-07-21 NOTE — Transfer of Care (Signed)
Immediate Anesthesia Transfer of Care Note  Patient: Kambria Grima Briggs  Procedure(s) Performed: BIOPSY TEMPORAL ARTERY (Right )  Patient Location: PACU  Anesthesia Type:General  Level of Consciousness: awake and alert   Airway & Oxygen Therapy: Patient Spontanous Breathing and Patient connected to nasal cannula oxygen  Post-op Assessment: Report given to RN and Post -op Vital signs reviewed and stable  Post vital signs: Reviewed and stable  Last Vitals:  Vitals Value Taken Time  BP 112/68 07/21/2017 12:25 PM  Temp    Pulse 93 07/21/2017 12:25 PM  Resp    SpO2 98 % 07/21/2017 12:25 PM  Vitals shown include unvalidated device data.  Last Pain:  Vitals:   07/21/17 0944  TempSrc: Temporal         Complications: No apparent anesthesia complications

## 2017-07-21 NOTE — OR Nursing (Signed)
C/O nausea med given 

## 2017-07-21 NOTE — OR Nursing (Signed)
IV not documented in Epic.  IV LR 50 remaining in bag on arrival to posto (IV site left arm #20G)

## 2017-07-21 NOTE — OR Nursing (Signed)
Anesthesia ordered duo neb

## 2017-07-22 ENCOUNTER — Encounter: Payer: Self-pay | Admitting: Vascular Surgery

## 2017-07-24 LAB — SURGICAL PATHOLOGY

## 2017-07-28 ENCOUNTER — Emergency Department: Payer: Medicare HMO

## 2017-07-28 ENCOUNTER — Other Ambulatory Visit: Payer: Self-pay

## 2017-07-28 ENCOUNTER — Emergency Department
Admission: EM | Admit: 2017-07-28 | Discharge: 2017-07-28 | Disposition: A | Discharge status: Home / Self Care | Payer: Medicare HMO | Attending: Emergency Medicine | Admitting: Emergency Medicine

## 2017-07-28 DIAGNOSIS — Z79899 Other long term (current) drug therapy: Secondary | ICD-10-CM | POA: Insufficient documentation

## 2017-07-28 DIAGNOSIS — M25561 Pain in right knee: Secondary | ICD-10-CM | POA: Diagnosis present

## 2017-07-28 DIAGNOSIS — E039 Hypothyroidism, unspecified: Secondary | ICD-10-CM | POA: Diagnosis not present

## 2017-07-28 DIAGNOSIS — Z7901 Long term (current) use of anticoagulants: Secondary | ICD-10-CM | POA: Insufficient documentation

## 2017-07-28 DIAGNOSIS — M1711 Unilateral primary osteoarthritis, right knee: Secondary | ICD-10-CM | POA: Diagnosis not present

## 2017-07-28 DIAGNOSIS — Z87891 Personal history of nicotine dependence: Secondary | ICD-10-CM | POA: Insufficient documentation

## 2017-07-28 DIAGNOSIS — I10 Essential (primary) hypertension: Secondary | ICD-10-CM | POA: Diagnosis not present

## 2017-07-28 DIAGNOSIS — I259 Chronic ischemic heart disease, unspecified: Secondary | ICD-10-CM | POA: Diagnosis not present

## 2017-07-28 MED ORDER — PREDNISONE 10 MG PO TABS: ORAL_TABLET | ORAL | 0 refills | Status: DC

## 2017-07-28 NOTE — ED Triage Notes (Signed)
Pt states that Tuesday her right knee buckled, pt states that it has happened before but has usually straightened itself out, states that this time it isn't, feels like it is going to bend backwards, pt is unable to put much weight on that side

## 2017-07-28 NOTE — Discharge Instructions (Addendum)
Call make an appointment with Dr. Sabra Heck about your knee.  Use support when walking.  Ice and elevation when needed for your knee swelling.  Begin taking prednisone 3 tablets once a day for the next 4 days.  Continue with your regular medication.

## 2017-07-28 NOTE — ED Notes (Signed)
Provider in room to discuss results at this time.  Will continue to monitor.

## 2017-07-28 NOTE — ED Provider Notes (Signed)
[facility] Emergency Department Provider Note  ____________________________________________   First MD Initiated Contact with Patient 07/28/17 [phone]     (approximate)  I have reviewed the triage vital signs and the nursing notes.   HISTORY  Chief Complaint Knee Pain   HPI Patricia Walker is a 72 y.o. female is here with complaint of right knee pain.  Patient states that her knee buckled with her 3 days ago and she is continued to have pain.  Patient states that she is able to straighten her leg and has continued to walk but with pain.  She uses a cane presently.  She has taken over-the-counter medication without any relief.  Patient denies actual fall or injury to her head.  She rates her pain as 7 out of 10.   Past Medical History:  Diagnosis Date  . Coronary artery disease    mild, nonobstructive  . Dysrhythmia    Atrial Fibrillation  . GERD (gastroesophageal reflux disease)   . Hyperlipidemia   . Hypertension   . Hypothyroidism   . 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 Morbid obesity ([place])   . Persistent atrial fibrillation ([place])   . Psoriasis   . Sleep apnea    compliant with CPAP  . Thyroid disease     Patient Active Problem List   Diagnosis Date Noted  . Unstable angina ([place]) 06/13/2017  . Temporal arteritis ([place]) 06/12/2017  . New onset of headaches 06/12/2017  . Photophobia of right eye 06/12/2017  . Osteoarthritis of knee 11/17/2016  . Coronary artery disease 09/16/2016  . History of cardioversion 09/16/2016  . Hyperlipidemia, unspecified 09/16/2016  . Hypertension 09/16/2016  . Obesity, unspecified 09/16/2016  . Atrial fibrillation ([place]) 09/08/2016  . Cardiac arrhythmia 09/09/2015  . Gastroesophageal reflux disease 09/09/2015  . Obstructive sleep apnea syndrome 09/09/2015  . Psoriasis 09/09/2015    Past Surgical History:  Procedure Laterality Date  . ARTERY BIOPSY Right 07/21/2017   Procedure: BIOPSY TEMPORAL ARTERY;   Surgeon: [name], MD;  Location: [facility] ORS;  Service: Vascular;  Laterality: Right;  . CARDIAC CATHETERIZATION    . CARDIOVERSION Right 09/01/2016   Procedure: Cardioversion;  Surgeon: [name], MD;  Location: [facility] ORS;  Service: Cardiovascular;  Laterality: Right;  . CARDIOVERSION N/A 09/09/2016   Procedure: Cardioversion;  Surgeon: [name], MD;  Location: [facility] ORS;  Service: Cardiovascular;  Laterality: N/A;  . ELECTROPHYSIOLOGIC STUDY N/A 02/01/2016   Procedure: CARDIOVERSION;  Surgeon: [name], MD;  Location: [facility] ORS;  Service: Cardiovascular;  Laterality: N/A;  . ESOPHAGEAL DILATION    . ESOPHAGOGASTRODUODENOSCOPY (EGD) WITH PROPOFOL N/A 02/06/2017   Procedure: ESOPHAGOGASTRODUODENOSCOPY (EGD) WITH PROPOFOL;  Surgeon: [name], MD;  Location: [facility] ENDOSCOPY;  Service: Gastroenterology;  Laterality: N/A;  . EUS N/A 02/16/2017   Procedure: FULL UPPER ENDOSCOPIC ULTRASOUND (EUS) RADIAL;  Surgeon: [name], MD;  Location: [facility] ENDOSCOPY;  Service: Gastroenterology;  Laterality: N/A;  . LEFT HEART CATH AND CORONARY ANGIOGRAPHY N/A 09/08/2016   Procedure: Left Heart Cath and Coronary Angiography;  Surgeon: [name], MD;  Location: [place] LAB;  Service: Cardiovascular;  Laterality: N/A;  . US ECHOCARDIOGRAPHY      Prior to Admission medications   Medication Sig Start Date End Date Taking? Authorizing Provider  albuterol (PROVENTIL HFA;VENTOLIN HFA) 108 (90 Base) MCG/ACT inhaler Inhale 2 puffs into the lungs every 6 (six) hours as needed for wheezing or shortness of breath.  [provider]  apixaban (ELIQUIS) 5 MG TABS tablet Take 5 mg by mouth 2 (two) times daily.    [provider]  atorvastatin (LIPITOR) 20 MG tablet Take 20 mg by mouth every evening. 08/25/16   [provider]  azelastine (ASTELIN) 0.1 % nasal spray Place 2 sprays into both nostrils 2 (two) times daily as needed for rhinitis or allergies.   10/13/16   [provider]  baclofen (LIORESAL) 10 MG tablet Take 10 mg by mouth 2 (two) times daily. 06/13/17   [provider]  budesonide-formoterol (SYMBICORT) 160-4.5 MCG/ACT inhaler Inhale 2 puffs into the lungs 2 (two) times daily as needed (for shortness of breath).     [provider]  butalbital-acetaminophen-caffeine (FIORICET WITH CODEINE) 50-325-40-30 MG capsule Take 1 capsule by mouth every 6 (six) hours as needed for migraine. 06/13/17   [provider]  calcium carbonate (TUMS - DOSED IN MG ELEMENTAL CALCIUM) 500 MG chewable tablet Chew 0.5 tablets by mouth 3 (three) times daily as needed for indigestion or heartburn.     [provider]  cetirizine (ZYRTEC) 10 MG tablet Take 10 mg by mouth daily as needed for allergies.    [provider]  Cholecalciferol (VITAMIN D3) 50000 units CAPS Take 50,000 Units by mouth every Monday.  08/24/16   [provider]  esomeprazole (NEXIUM) 40 MG capsule Take 40 mg by mouth daily. 07/01/17   [provider]  folic acid (FOLVITE) 1 MG tablet Take 3 mg by mouth See admin instructions. Takes 3 tablets (3mg ) daily except for Mondays.    [provider]  furosemide (LASIX) 20 MG tablet Take 20 mg by mouth daily as needed for fluid. 08/24/16   [provider]  HYDROcodone-acetaminophen (NORCO) 5-325 MG tablet Take 1-2 tablets by mouth every 6 (six) hours as needed for moderate pain or severe pain. 07/21/17   Schnier, Dolores Lory, MD  ibuprofen (ADVIL,MOTRIN) 200 MG tablet Take 400 mg by mouth every 8 (eight) hours as needed for headache or mild pain.    [provider]  ipratropium (ATROVENT) 0.06 % nasal spray Place 2 sprays into both nostrils 2 (two) times daily as needed for rhinitis.     [provider]  levothyroxine (SYNTHROID, LEVOTHROID) 50 MCG tablet Take 50 mcg by mouth daily before breakfast.    [provider]  methotrexate (RHEUMATREX) 2.5  MG tablet Take 15 mg by mouth every Monday.  05/23/17   [provider]  montelukast (SINGULAIR) 10 MG tablet Take 10 mg by mouth daily. 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    [provider]  nitroGLYCERIN (NITROSTAT) 0.4 MG SL tablet Place 0.4 mg under the tongue every 5 (five) minutes as needed for chest pain. 04/27/17   [provider]  nystatin (NYSTATIN) powder Apply 1 g topically 3 (three) times daily as needed. Irritation under breast and groin area     [provider]  ondansetron (ZOFRAN) 4 MG tablet Take 4 mg by mouth every 6 (six) hours as needed for nausea or vomiting.    [provider]  predniSONE (DELTASONE) 10 MG tablet Take 3 tablets once a day for the next 4 days. 07/28/17   Johnn Hai, PA-C  ranitidine (ZANTAC) 150 MG tablet Take 150 mg by mouth 2 (two) times daily. 04/27/17   [provider]  Allergies Codeine and Other  Family History  Problem Relation Age of Onset  . Breast cancer Paternal Aunt   . Other Father   . Heart attack Father     Social History Social History   Tobacco Use  . Smoking status: Former Smoker    Types: Cigarettes    Last attempt to quit: 02/01/2013    Years since quitting: 4.4  . Smokeless tobacco: Never Used  Substance Use Topics  . Alcohol use: No  . Drug use: No    Review of Systems Constitutional: No fever/chills Cardiovascular: Denies chest pain. Respiratory: Denies shortness of breath. Gastrointestinal: No abdominal pain.  No nausea, no vomiting.  Musculoskeletal: The for right knee pain. Neurological: Negative for  focal weakness or numbness. ____________________________________________   PHYSICAL EXAM:  VITAL SIGNS: ED Triage Vitals  Enc Vitals Group     BP --      Pulse Rate 07/28/17 0924 68     Resp 07/28/17  0924 18     Temp 07/28/17 0924 98.5 F (36.9 C)     Temp Source 07/28/17 0924 Oral     SpO2 07/28/17 0924 98 %     Weight 07/28/17 0929 300 lb (136.1 kg)     Height 07/28/17 0929 5\' 4"  (1.626 m)     Head Circumference --      Peak Flow --      Pain Score 07/28/17 0929 7     Pain Loc --      Pain Edu? --      Excl. in [place]? --     Constitutional: Alert and oriented. Well appearing and in no acute distress.  Morbidly obese. Eyes: Conjunctivae are normal.  Head: Atraumatic. Neck: No stridor.   Cardiovascular: Normal rate, regular rhythm. Grossly normal heart sounds.  Good peripheral circulation. Respiratory: Normal respiratory effort.  No retractions. Lungs CTAB. Gastrointestinal: Soft and nontender. No distention.  Musculoskeletal: On examination of the right knee there is some difficulty with landmarks due to large body habitus.  Patient is able to flex and extend without any difficulty.  Crepitus is noted with range of motion.  There is no effusion present.  Skin is intact.  No ecchymosis or abrasions were seen.  Motor sensory function intact distal to her knee.  Patient ambulates without difficulty using her cane. Neurologic:  Normal speech and language. No gross focal neurologic deficits are appreciated.  Skin:  Skin is warm, dry and intact. No rash noted. Psychiatric: Mood and affect are normal. Speech and behavior are normal.  ____________________________________________   LABS (all labs ordered are listed, but only abnormal results are displayed)  Labs Reviewed - No data to display  RADIOLOGY  ED MD interpretation:   Right knee x-ray with osteoarthritis.  Official radiology report(s): Dg Knee Complete 4 Views Right  Result Date: 07/28/2017 CLINICAL DATA:  Buckling right knee. EXAM: RIGHT KNEE - COMPLETE 4+ VIEW COMPARISON:  05/22/2014. FINDINGS: No acute bony or joint abnormality. Severe tricompartment degenerative change with large loose bodies noted. No effusion.  IMPRESSION: Severe tricompartment degenerative change with large loose bodies. No acute bony abnormality. Electronically Signed   By: Marcello Moores  Register   On: 07/28/2017 10:19    ____________________________________________   PROCEDURES  Procedure(s) performed: None  Procedures    Critical Care performed: No  ____________________________________________   INITIAL IMPRESSION / ASSESSMENT AND PLAN / ED COURSE  Patient was made aware of her x-ray findings and that this is most likely the cause of her  right knee pain.  Patient has been walking with cane which with her body weight is probably not supportive.  Patient was given a walker with instructions on how to use it.  She also was wrapped loosely with a Ace wrap for support because knee immobilizer did not fit her.  Patient is to follow-up with Dr. Sabra Heck who she is seen in the past for her knee problems.  She is encouraged to call make an appointment.  Patient was given a prescription for prednisone 10 mg, 3 tablets once a day for the next 4 days.  ____________________________________________   FINAL CLINICAL IMPRESSION(S) / ED DIAGNOSES  Final diagnoses:  Tricompartment osteoarthritis of right knee     ED Discharge Orders        Ordered    predniSONE (DELTASONE) 10 MG tablet     07/28/17 1030       Note:  This document was prepared using Dragon voice recognition software and Keep include unintentional dictation errors.    Johnn Hai, PA-C 07/28/17 1305    Gregor Hams, MD 07/28/17 [phone]

## 2017-07-28 NOTE — ED Notes (Signed)
First Nurse Note:  Patient complaining of right knee pain, placed in [place].

## 2017-08-03 ENCOUNTER — Encounter (INDEPENDENT_AMBULATORY_CARE_PROVIDER_SITE_OTHER): Payer: Self-pay | Admitting: Vascular Surgery

## 2017-08-03 ENCOUNTER — Ambulatory Visit (INDEPENDENT_AMBULATORY_CARE_PROVIDER_SITE_OTHER): Payer: Medicare HMO | Admitting: Vascular Surgery

## 2017-08-03 VITALS — BP 144/94 | HR 51 | Resp 17 | Ht 64.5 in | Wt 299.0 lb

## 2017-08-03 DIAGNOSIS — I25118 Atherosclerotic heart disease of native coronary artery with other forms of angina pectoris: Secondary | ICD-10-CM | POA: Diagnosis not present

## 2017-08-03 DIAGNOSIS — E782 Mixed hyperlipidemia: Secondary | ICD-10-CM | POA: Diagnosis not present

## 2017-08-03 DIAGNOSIS — R51 Headache: Secondary | ICD-10-CM | POA: Diagnosis not present

## 2017-08-03 DIAGNOSIS — I4891 Unspecified atrial fibrillation: Secondary | ICD-10-CM

## 2017-08-03 DIAGNOSIS — R519 Headache, unspecified: Secondary | ICD-10-CM

## 2017-08-03 NOTE — Progress Notes (Signed)
MRN : [id number]  Cornelia Walraven Mis is a 72 y.o. (1945-07-25) female who presents with chief complaint of  Chief Complaint  Patient presents with  . Follow-up    1-2 week [facility] follow up  .  History of Present Illness:   Right temporal artery biopsy done 07/21/2017.  No c/o about the incision/pain  Still with headache  Current Meds  Medication Sig  . albuterol (PROVENTIL HFA;VENTOLIN HFA) 108 (90 Base) MCG/ACT inhaler Inhale 2 puffs into the lungs every 6 (six) hours as needed for wheezing or shortness of breath.  [name] Kitchen apixaban (ELIQUIS) 5 MG TABS tablet Take 5 mg by mouth 2 (two) times daily.  [name] Kitchen atorvastatin (LIPITOR) 20 MG tablet Take 20 mg by mouth every evening.  [name] Kitchen azelastine (ASTELIN) 0.1 % nasal spray Place 2 sprays into both nostrils 2 (two) times daily as needed for rhinitis or allergies.   . baclofen (LIORESAL) 10 MG tablet Take 10 mg by mouth 2 (two) times daily.  . budesonide-formoterol (SYMBICORT) 160-4.5 MCG/ACT inhaler Inhale 2 puffs into the lungs 2 (two) times daily as needed (for shortness of breath).   . butalbital-acetaminophen-caffeine (FIORICET WITH CODEINE) 50-325-40-30 MG capsule Take 1 capsule by mouth every 6 (six) hours as needed for migraine.  . calcium carbonate (TUMS - DOSED IN MG ELEMENTAL CALCIUM) 500 MG chewable tablet Chew 0.5 tablets by mouth 3 (three) times daily as needed for indigestion or heartburn.   . cetirizine (ZYRTEC) 10 MG tablet Take 10 mg by mouth daily as needed for allergies.  . Cholecalciferol (VITAMIN D3) 50000 units CAPS Take 50,000 Units by mouth every Monday.   . esomeprazole (NEXIUM) 40 MG capsule Take 40 mg by mouth daily.  . folic acid (FOLVITE) 1 MG tablet Take 3 mg by mouth See admin instructions. Takes 3 tablets (3mg ) daily except for Mondays.  . furosemide (LASIX) 20 MG tablet Take 20 mg by mouth daily as needed for fluid.  [name] Kitchen gabapentin (NEURONTIN) 300 MG capsule Take by mouth.  . hydrochlorothiazide (HYDRODIURIL) 25 MG tablet  Take by mouth.  [name] Kitchen HYDROcodone-acetaminophen (NORCO) 5-325 MG tablet Take 1-2 tablets by mouth every 6 (six) hours as needed for moderate pain or severe pain.  . hydrOXYzine (ATARAX/VISTARIL) 25 MG tablet Take by mouth.  [name] Kitchen ibuprofen (ADVIL,MOTRIN) 200 MG tablet Take 400 mg by mouth every 8 (eight) hours as needed for headache or mild pain.  [name] Kitchen ipratropium (ATROVENT) 0.06 % nasal spray Place 2 sprays into both nostrils 2 (two) times daily as needed for rhinitis.   . methotrexate (RHEUMATREX) 2.5 MG tablet Take 15 mg by mouth every Monday.   . montelukast (SINGULAIR) 10 MG tablet Take 10 mg by mouth daily.  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  . nitroGLYCERIN (NITROSTAT) 0.4 MG SL tablet Place 0.4 mg under the tongue every 5 (five) minutes as needed for chest pain.  [name] Kitchen nystatin (NYSTATIN) powder Apply 1 g topically 3 (three) times daily as needed. Irritation under breast and groin area   . ondansetron (ZOFRAN) 4 MG tablet Take 4 mg by mouth every 6 (six) hours as needed for nausea or vomiting.  . predniSONE (DELTASONE) 10 MG tablet Take 3 tablets once a day for the next 4 days.  . ranitidine (ZANTAC) 150 MG tablet Take 150 mg by mouth 2 (two) times daily.    Past Medical History:  Diagnosis Date  . Coronary artery  disease    mild, nonobstructive  . Dysrhythmia    Atrial Fibrillation  . GERD (gastroesophageal reflux disease)   . Hyperlipidemia   . Hypertension   . Hypothyroidism   . 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 Morbid obesity ([place])   . Persistent atrial fibrillation ([place])   . Psoriasis   . Sleep apnea    compliant with CPAP  . Thyroid disease     Past Surgical History:  Procedure Laterality Date  . ARTERY BIOPSY Right 07/21/2017   Procedure: BIOPSY TEMPORAL ARTERY;  Surgeon: [name], MD;  Location: [facility] ORS;  Service: Vascular;  Laterality: Right;  .  CARDIAC CATHETERIZATION    . CARDIOVERSION Right 09/01/2016   Procedure: Cardioversion;  Surgeon: [name], MD;  Location: [facility] ORS;  Service: Cardiovascular;  Laterality: Right;  . CARDIOVERSION N/A 09/09/2016   Procedure: Cardioversion;  Surgeon: [name], MD;  Location: [facility] ORS;  Service: Cardiovascular;  Laterality: N/A;  . ELECTROPHYSIOLOGIC STUDY N/A 02/01/2016   Procedure: CARDIOVERSION;  Surgeon: [name], MD;  Location: [facility] ORS;  Service: Cardiovascular;  Laterality: N/A;  . ESOPHAGEAL DILATION    . ESOPHAGOGASTRODUODENOSCOPY (EGD) WITH PROPOFOL N/A 02/06/2017   Procedure: ESOPHAGOGASTRODUODENOSCOPY (EGD) WITH PROPOFOL;  Surgeon: [name], MD;  Location: [facility] ENDOSCOPY;  Service: Gastroenterology;  Laterality: N/A;  . EUS N/A 02/16/2017   Procedure: FULL UPPER ENDOSCOPIC ULTRASOUND (EUS) RADIAL;  Surgeon: [name], MD;  Location: [facility] ENDOSCOPY;  Service: Gastroenterology;  Laterality: N/A;  . LEFT HEART CATH AND CORONARY ANGIOGRAPHY N/A 09/08/2016   Procedure: Left Heart Cath and Coronary Angiography;  Surgeon: [name], MD;  Location: [place] CV LAB;  Service: Cardiovascular;  Laterality: N/A;  . US ECHOCARDIOGRAPHY      Social History Social History   Tobacco Use  . Smoking status: Former Smoker    Types: Cigarettes    Last attempt to quit: 02/01/2013    Years since quitting: 4.5  . Smokeless tobacco: Never Used  Substance Use Topics  . Alcohol use: No  . Drug use: No    Family History Family History  Problem Relation Age of Onset  . Breast cancer Paternal Aunt   . Other Father   . Heart attack Father     Allergies  Allergen Reactions  . Codeine Itching  . Other Itching and Other (See Comments)    States antibiotic in the past caused itching but can not remember name     REVIEW OF SYSTEMS (Negative unless checked)  Constitutional: [] Weight loss  [] Fever  [] Chills Cardiac: [] Chest pain   [] Chest pressure    [] Palpitations   [] Shortness of breath when laying flat   [] Shortness of breath with exertion. Vascular:  [] Pain in legs with walking   [] Pain in legs at rest  [] History of DVT   [] Phlebitis   [] Swelling in legs   [] Varicose veins   [] Non-healing ulcers Pulmonary:   [] Uses home oxygen   [] Productive cough   [] Hemoptysis   [] Wheeze  [] COPD   [] Asthma Neurologic:  [] Dizziness   [] Seizures   [] History of stroke   [] History of TIA  [] Aphasia   [x] Vissual changes   [] Weakness or numbness in arm   [] Weakness or numbness in leg Musculoskeletal:   [] Joint swelling   [] Joint pain   [] Low back pain Hematologic:  [] Easy bruising  [] Easy bleeding   [] Hypercoagulable state   [] Anemic Gastrointestinal:  [] Diarrhea   [] Vomiting  [] Gastroesophageal reflux/heartburn   []   Difficulty swallowing. Genitourinary:  [] Chronic kidney disease   [] Difficult urination  [] Frequent urination   [] Blood in urine Skin:  [] Rashes   [] Ulcers  Psychological:  [] History of anxiety   []  History of major depression.  Physical Examination  Vitals:   08/03/17 1018  BP: (!) 144/94  Pulse: (!) 51  Resp: 17  Weight: 299 lb (135.6 kg)  Height: 5' 4.5" (1.638 m)   Body mass index is 50.53 kg/m. Gen: WD/WN, NAD Head: [place]/AT, No temporalis wasting.  Ear/Nose/Throat: Hearing grossly intact, nares w/o erythema or drainage Eyes: PER, EOMI, sclera nonicteric.  Neck: Supple, no large masses.   Pulmonary:  Good air movement, no audible wheezing bilaterally, no use of accessory muscles.  Cardiac: RRR, no JVD Vascular: right temp. incision CD&I Vessel Right Left  Radial Palpable Palpable  Gastrointestinal: Non-distended. No guarding/no peritoneal signs.  Musculoskeletal: M/S 5/5 throughout.  No deformity or atrophy.  Neurologic: CN 2-12 intact. Symmetrical.  Speech is fluent. Motor exam as listed above. Psychiatric: Judgment intact, Mood & affect appropriate for pt's clinical situation. Dermatologic: No rashes or ulcers noted.  No  changes consistent with cellulitis. Lymph : No lichenification or skin changes of chronic lymphedema.  CBC Lab Results  Component Value Date   WBC 5.1 07/21/2017   HGB 12.9 07/21/2017   HCT 38.1 07/21/2017   MCV 87.7 07/21/2017   PLT 285 07/21/2017    BMET    Component Value Date/Time   NA 137 07/21/2017 1012   NA 140 04/18/2014 1216   K 3.9 07/21/2017 1012   K 3.9 04/18/2014 1216   CL 106 07/21/2017 1012   CL 108 (H) 04/18/2014 1216   CO2 25 07/21/2017 1012   CO2 24 04/18/2014 1216   GLUCOSE 113 (H) 07/21/2017 1012   GLUCOSE 94 04/18/2014 1216   BUN 17 07/21/2017 1012   BUN 16 04/18/2014 1216   CREATININE 0.85 07/21/2017 1012   CREATININE 0.79 04/18/2014 1216   CALCIUM 8.5 (L) 07/21/2017 1012   CALCIUM 8.4 (L) 04/18/2014 1216   GFRNONAA >60 07/21/2017 1012   GFRNONAA >60 04/18/2014 1216   GFRNONAA 53 (L) 07/13/2012 1542   GFRAA >60 07/21/2017 1012   GFRAA >60 04/18/2014 1216   GFRAA >60 07/13/2012 1542   Estimated Creatinine Clearance: 84.1 mL/min (by C-G formula based on SCr of 0.85 mg/dL).  COAG Lab Results  Component Value Date   INR 0.98 07/21/2017   INR 1.00 06/12/2017   INR 0.97 09/08/2016    Radiology Dg Chest Port 1 View  Result Date: 07/21/2017 CLINICAL DATA:  Status post left temporal artery biopsy. Increasing shortness of breath and cough. EXAM: PORTABLE CHEST 1 VIEW COMPARISON:  05/21/2017. FINDINGS: Heart size is mildly prominent but Fahr be accentuated by the AP portable technique. Central peribronchial thickening is new from the previous examination. No focal airspace disease. Negative for a pneumothorax. Bone structures are unremarkable. IMPRESSION: New central peribronchial thickening. Findings could represent early or mild edema. No focal airspace disease. Electronically Signed   By: Markus Daft M.D.   On: 07/21/2017 13:15   Dg Knee Complete 4 Views Right  Result Date: 07/28/2017 CLINICAL DATA:  Buckling right knee. EXAM: RIGHT KNEE - COMPLETE  4+ VIEW COMPARISON:  05/22/2014. FINDINGS: No acute bony or joint abnormality. Severe tricompartment degenerative change with large loose bodies noted. No effusion. IMPRESSION: Severe tricompartment degenerative change with large loose bodies. No acute bony abnormality. Electronically Signed   By: Marcello Moores  Register   On: 07/28/2017 10:19  Assessment/Plan  1. New onset of headaches Biopsy is negative  Follow up with Neurology  2. Mixed hyperlipidemia Continue statin as ordered and reviewed, no changes at this time   3. Atrial fibrillation, unspecified type () Continue antiarrhythmia medications as already ordered, these medications have been reviewed and there are no changes at this time.  Continue anticoagulation as ordered by Cardiology Service   4. Coronary artery disease of native artery of native heart with stable angina pectoris (HCC) Continue cardiac and antihypertensive medications as already ordered and reviewed, no changes at this time.  Continue statin as ordered and reviewed, no changes at this time  Nitrates PRN for chest pain    Hortencia Pilar, MD  08/03/2017 10:30 AM

## 2017-08-10 ENCOUNTER — Ambulatory Visit: Admit: 2017-08-10 | Discharge: 2017-08-11 | Disposition: A | Discharge status: Home / Self Care | Payer: MEDICARE

## 2017-08-10 ENCOUNTER — Emergency Department: Admit: 2017-08-10 | Discharge: 2017-08-11 | Disposition: A | Discharge status: Home / Self Care | Payer: MEDICARE

## 2017-08-10 DIAGNOSIS — M25569 Pain in unspecified knee: Principal | ICD-10-CM

## 2017-08-10 MED ORDER — OXYCODONE 5 MG TABLET
ORAL_TABLET | Freq: Four times a day (QID) | ORAL | 0 refills | 0.00000 days | Status: CP | PRN
Start: 2017-08-10 — End: 2017-08-13

## 2017-09-18 ENCOUNTER — Ambulatory Visit
Admit: 2017-09-18 | Discharge: 2017-09-19 | Payer: MEDICARE | Attending: MOHS-Micrographic Surgery | Primary: MOHS-Micrographic Surgery

## 2017-09-18 DIAGNOSIS — Z79899 Other long term (current) drug therapy: Secondary | ICD-10-CM

## 2017-09-18 DIAGNOSIS — L409 Psoriasis, unspecified: Principal | ICD-10-CM

## 2017-09-18 MED ORDER — TRIAMCINOLONE ACETONIDE 0.1 % TOPICAL OINTMENT
3 refills | 0 days | Status: CP
Start: 2017-09-18 — End: ?

## 2017-09-19 MED ORDER — METHOTREXATE SODIUM 2.5 MG TABLET
ORAL_TABLET | 2 refills | 0.00000 days | Status: CP
Start: 2017-09-19 — End: 2018-01-16

## 2017-09-29 ENCOUNTER — Ambulatory Visit
Admit: 2017-09-29 | Discharge: 2017-09-29 | Payer: MEDICARE | Attending: Cardiovascular Disease | Primary: Cardiovascular Disease

## 2017-09-29 DIAGNOSIS — I4891 Unspecified atrial fibrillation: Principal | ICD-10-CM

## 2017-10-02 MED ORDER — BETAMETHASONE, AUGMENTED 0.05 % TOPICAL OINTMENT
5 refills | 0.00000 days | Status: CP
Start: 2017-10-02 — End: ?

## 2017-10-09 ENCOUNTER — Ambulatory Visit: Admit: 2017-10-09 | Discharge: 2017-10-10 | Payer: MEDICARE

## 2017-10-09 DIAGNOSIS — L409 Psoriasis, unspecified: Principal | ICD-10-CM

## 2017-10-11 ENCOUNTER — Ambulatory Visit: Admit: 2017-10-11 | Discharge: 2017-10-11 | Payer: MEDICARE

## 2017-10-11 ENCOUNTER — Other Ambulatory Visit: Payer: Self-pay | Admitting: Internal Medicine

## 2017-10-11 DIAGNOSIS — L409 Psoriasis, unspecified: Principal | ICD-10-CM

## 2017-10-11 DIAGNOSIS — B372 Candidiasis of skin and nail: Principal | ICD-10-CM

## 2017-10-11 DIAGNOSIS — Z1231 Encounter for screening mammogram for malignant neoplasm of breast: Secondary | ICD-10-CM

## 2017-10-16 ENCOUNTER — Ambulatory Visit: Admit: 2017-10-16 | Discharge: 2017-10-17 | Payer: MEDICARE

## 2017-10-16 DIAGNOSIS — L409 Psoriasis, unspecified: Principal | ICD-10-CM

## 2017-10-20 DIAGNOSIS — M7631 Iliotibial band syndrome, right leg: Secondary | ICD-10-CM | POA: Insufficient documentation

## 2017-10-20 DIAGNOSIS — M7630 Iliotibial band syndrome, unspecified leg: Secondary | ICD-10-CM | POA: Insufficient documentation

## 2017-10-20 DIAGNOSIS — M7632 Iliotibial band syndrome, left leg: Secondary | ICD-10-CM | POA: Insufficient documentation

## 2017-11-01 ENCOUNTER — Ambulatory Visit
Admission: RE | Admit: 2017-11-01 | Discharge: 2017-11-01 | Disposition: A | Discharge status: Home / Self Care | Payer: Medicare HMO | Source: Ambulatory Visit | Attending: Internal Medicine | Admitting: Internal Medicine

## 2017-11-01 DIAGNOSIS — Z1231 Encounter for screening mammogram for malignant neoplasm of breast: Secondary | ICD-10-CM

## 2017-12-25 ENCOUNTER — Emergency Department
Admission: EM | Admit: 2017-12-25 | Discharge: 2017-12-25 | Disposition: A | Discharge status: Home / Self Care | Payer: Medicare HMO | Attending: Emergency Medicine | Admitting: Emergency Medicine

## 2017-12-25 ENCOUNTER — Other Ambulatory Visit: Payer: Self-pay

## 2017-12-25 ENCOUNTER — Emergency Department: Payer: Medicare HMO

## 2017-12-25 ENCOUNTER — Encounter: Payer: Self-pay | Admitting: Emergency Medicine

## 2017-12-25 DIAGNOSIS — Z7901 Long term (current) use of anticoagulants: Secondary | ICD-10-CM | POA: Insufficient documentation

## 2017-12-25 DIAGNOSIS — Z79899 Other long term (current) drug therapy: Secondary | ICD-10-CM | POA: Diagnosis not present

## 2017-12-25 DIAGNOSIS — R109 Unspecified abdominal pain: Secondary | ICD-10-CM | POA: Diagnosis present

## 2017-12-25 DIAGNOSIS — I252 Old myocardial infarction: Secondary | ICD-10-CM | POA: Insufficient documentation

## 2017-12-25 DIAGNOSIS — Z87891 Personal history of nicotine dependence: Secondary | ICD-10-CM | POA: Insufficient documentation

## 2017-12-25 DIAGNOSIS — R1084 Generalized abdominal pain: Secondary | ICD-10-CM | POA: Insufficient documentation

## 2017-12-25 DIAGNOSIS — I2510 Atherosclerotic heart disease of native coronary artery without angina pectoris: Secondary | ICD-10-CM | POA: Insufficient documentation

## 2017-12-25 DIAGNOSIS — I10 Essential (primary) hypertension: Secondary | ICD-10-CM | POA: Insufficient documentation

## 2017-12-25 DIAGNOSIS — E039 Hypothyroidism, unspecified: Secondary | ICD-10-CM | POA: Diagnosis not present

## 2017-12-25 DIAGNOSIS — N3090 Cystitis, unspecified without hematuria: Secondary | ICD-10-CM

## 2017-12-25 LAB — COMPREHENSIVE METABOLIC PANEL
ALBUMIN: 4.2 g/dL (ref 3.5–5.0)
ALT: 22 U/L (ref 0–44)
AST: 24 U/L (ref 15–41)
Alkaline Phosphatase: 63 U/L (ref 38–126)
Anion gap: 8 (ref 5–15)
BUN: 21 mg/dL (ref 8–23)
CO2: 27 mmol/L (ref 22–32)
Calcium: 9 mg/dL (ref 8.9–10.3)
Chloride: 102 mmol/L (ref 98–111)
Creatinine, Ser: 0.85 mg/dL (ref 0.44–1.00)
Glucose, Bld: 125 mg/dL — ABNORMAL HIGH (ref 70–99)
POTASSIUM: 3.5 mmol/L (ref 3.5–5.1)
SODIUM: 137 mmol/L (ref 135–145)
Total Bilirubin: 0.7 mg/dL (ref 0.3–1.2)
Total Protein: 7 g/dL (ref 6.5–8.1)

## 2017-12-25 LAB — LIPASE, BLOOD: Lipase: 25 U/L (ref 11–51)

## 2017-12-25 LAB — URINALYSIS, COMPLETE (UACMP) WITH MICROSCOPIC
Bacteria, UA: NONE SEEN
Bilirubin Urine: NEGATIVE
Glucose, UA: NEGATIVE mg/dL
Hgb urine dipstick: NEGATIVE
KETONES UR: NEGATIVE mg/dL
Nitrite: NEGATIVE
PROTEIN: NEGATIVE mg/dL
Specific Gravity, Urine: 1.019 (ref 1.005–1.030)
pH: 6 (ref 5.0–8.0)

## 2017-12-25 LAB — CBC
HEMATOCRIT: 41.9 % (ref 35.0–47.0)
Hemoglobin: 14.4 g/dL (ref 12.0–16.0)
MCH: 30.9 pg (ref 26.0–34.0)
MCHC: 34.4 g/dL (ref 32.0–36.0)
MCV: 89.7 fL (ref 80.0–100.0)
Platelets: 210 10*3/uL (ref 150–440)
RBC: 4.67 MIL/uL (ref 3.80–5.20)
RDW: 15.3 % — ABNORMAL HIGH (ref 11.5–14.5)
WBC: 6.6 10*3/uL (ref 3.6–11.0)

## 2017-12-25 MED ORDER — CEPHALEXIN 500 MG PO CAPS: 500.0000 mg | ORAL_CAPSULE | Freq: Two times a day (BID) | ORAL | 0 refills | Status: DC

## 2017-12-25 MED ORDER — ONDANSETRON 4 MG PO TBDP: 4.0000 mg | ORAL_TABLET | Freq: Three times a day (TID) | ORAL | 0 refills | Status: DC | PRN

## 2017-12-25 MED ORDER — SODIUM CHLORIDE 0.9 % IV BOLUS
1000.0000 mL | Freq: Once | INTRAVENOUS | Status: AC
  Administered 2017-12-25: 1000 mL via INTRAVENOUS

## 2017-12-25 MED ORDER — FAMOTIDINE IN NACL 20-0.9 MG/50ML-% IV SOLN
20.0000 mg | Freq: Once | INTRAVENOUS | Status: AC
Start: 2017-12-25 — End: 2017-12-25
  Administered 2017-12-25: 20 mg via INTRAVENOUS
  Filled 2017-12-25: qty 50

## 2017-12-25 MED ORDER — IOPAMIDOL (ISOVUE-300) INJECTION 61%
125.0000 mL | Freq: Once | INTRAVENOUS | Status: AC | PRN
  Administered 2017-12-25: 125 mL via INTRAVENOUS

## 2017-12-25 MED ORDER — ONDANSETRON HCL 4 MG/2ML IJ SOLN
4.0000 mg | Freq: Once | INTRAMUSCULAR | Status: AC
  Administered 2017-12-25: 4 mg via INTRAVENOUS
  Filled 2017-12-25: qty 2

## 2017-12-25 NOTE — ED Provider Notes (Signed)
**Note Patricia-Identified via Obfuscation** [facility] Emergency Department Provider Note  ____________________________________________  Time seen: Approximately 6:18 PM  I have reviewed the triage vital signs and the nursing notes.   HISTORY  Chief Complaint Abdominal Pain and Dizziness    HPI Patricia Walker is a 72 y.o. female with a history of CAD hypertension hyperlipidemia and atrial fibrillation who complains of crampy upper abdominal pain for the past 5 days.   No aggravating or alleviating factors.  Nonradiating.  Decreased appetite, nausea but tolerating oral intake.  No vomiting.  About 5-6 loose bowel movements a day.  Denies any recent hospitalization or antibiotic use, no history of C. difficile.  No fevers or chills.  Does also have urinary frequency.  Past Medical History:  Diagnosis Date  . Coronary artery disease    mild, nonobstructive  . Dysrhythmia    Atrial Fibrillation  . GERD (gastroesophageal reflux disease)   . Hyperlipidemia   . Hypertension   . Hypothyroidism   . 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 Morbid obesity (Patricia Walker)   . Persistent atrial fibrillation (Patricia Walker)   . Psoriasis   . Sleep apnea    compliant with CPAP  . Thyroid disease      Patient Active Problem List   Diagnosis Date Noted  . Unstable angina (Patricia Walker) 06/13/2017  . New onset of headaches 06/12/2017  . Photophobia of right eye 06/12/2017  . Osteoarthritis of knee 11/17/2016  . Coronary artery disease 09/16/2016  . History of cardioversion 09/16/2016  . Hyperlipidemia, unspecified 09/16/2016  . Hypertension 09/16/2016  . Obesity, unspecified 09/16/2016  . Atrial fibrillation (Patricia Walker) 09/08/2016  . Cardiac arrhythmia 09/09/2015  . Gastroesophageal reflux disease 09/09/2015  . Obstructive sleep apnea syndrome 09/09/2015  . Psoriasis 09/09/2015     Past Surgical History:  Procedure Laterality Date  . ARTERY BIOPSY Right 07/21/2017   Procedure: BIOPSY TEMPORAL ARTERY;  Surgeon: Patricia Cabal, MD;  Location: [facility] ORS;  Service: Vascular;  Laterality: Right;  . CARDIAC CATHETERIZATION    . CARDIOVERSION Right 09/01/2016   Procedure: Cardioversion;  Surgeon: Patricia David, MD;  Location: [facility] ORS;  Service: Cardiovascular;  Laterality: Right;  . CARDIOVERSION N/A 09/09/2016   Procedure: Cardioversion;  Surgeon: Patricia David, MD;  Location: [facility] ORS;  Service: Cardiovascular;  Laterality: N/A;  . ELECTROPHYSIOLOGIC STUDY N/A 02/01/2016   Procedure: CARDIOVERSION;  Surgeon: Patricia David, MD;  Location: [facility] ORS;  Service: Cardiovascular;  Laterality: N/A;  . ESOPHAGEAL DILATION    . ESOPHAGOGASTRODUODENOSCOPY (EGD) WITH PROPOFOL N/A 02/06/2017   Procedure: ESOPHAGOGASTRODUODENOSCOPY (EGD) WITH PROPOFOL;  Surgeon: Patricia Bellows, MD;  Location: [name] ENDOSCOPY;  Service: Gastroenterology;  Laterality: N/A;  . EUS N/A 02/16/2017   Procedure: FULL UPPER ENDOSCOPIC ULTRASOUND (EUS) RADIAL;  Surgeon: Patricia Cliche, MD;  Location: [facility] ENDOSCOPY;  Service: Gastroenterology;  Laterality: N/A;  . LEFT HEART CATH AND CORONARY ANGIOGRAPHY N/A 09/08/2016   Procedure: Left Heart Cath and Coronary Angiography;  Surgeon: Patricia David, MD;  Location: [facility];  Service: Cardiovascular;  Laterality: N/A;  . US ECHOCARDIOGRAPHY       Prior to Admission medications   Medication Sig Start Date End Date Taking? Authorizing Provider  albuterol (PROVENTIL HFA;VENTOLIN HFA) 108 (90 Base) MCG/ACT inhaler Inhale 2 puffs into the lungs every 6 (six) hours as needed for wheezing or shortness of breath.    [provider]  apixaban (ELIQUIS) 5 MG TABS tablet Take 5 mg by mouth  2 (two) times daily.    [provider]  atorvastatin (LIPITOR) 20 MG tablet Take 20 mg by mouth every evening. 08/25/16   [provider]  azelastine (ASTELIN) 0.1 % nasal spray Place 2 sprays into both nostrils 2 (two) times daily as needed for rhinitis or allergies.  10/13/16   [provider]  baclofen (LIORESAL) 10 MG tablet Take 10 mg by mouth 2 (two) times daily. 06/13/17   [provider]  budesonide-formoterol (SYMBICORT) 160-4.5 MCG/ACT inhaler Inhale 2 puffs into the lungs 2 (two) times daily as needed (for shortness of breath).     [provider]  butalbital-acetaminophen-caffeine (FIORICET WITH CODEINE) 50-325-40-30 MG capsule Take 1 capsule by mouth every 6 (six) hours as needed for migraine. 06/13/17   [provider]  calcium carbonate (TUMS - DOSED IN MG ELEMENTAL CALCIUM) 500 MG chewable tablet Chew 0.5 tablets by mouth 3 (three) times daily as needed for indigestion or heartburn.     [provider]  cetirizine (ZYRTEC) 10 MG tablet Take 10 mg by mouth daily as needed for allergies.    [provider]  Cholecalciferol (VITAMIN D3) 50000 units CAPS Take 50,000 Units by mouth every Monday.  08/24/16   [provider]  esomeprazole (NEXIUM) 40 MG capsule Take 40 mg by mouth daily. 07/01/17   [provider]  folic acid (FOLVITE) 1 MG tablet Take 3 mg by mouth See admin instructions. Takes 3 tablets (3mg ) daily except for Mondays.    [provider]  furosemide (LASIX) 20 MG tablet Take 20 mg by mouth daily as needed for fluid. 08/24/16   [provider]  gabapentin (NEURONTIN) 300 MG capsule Take by mouth.    [provider]  hydrochlorothiazide (HYDRODIURIL) 25 MG tablet Take by mouth.    [provider]  HYDROcodone-acetaminophen (NORCO) 5-325 MG tablet Take 1-2 tablets by mouth every 6 (six) hours as needed for moderate pain or severe pain. 07/21/17   Schnier, Patricia Lory, MD  hydrOXYzine (ATARAX/VISTARIL) 25 MG tablet Take by mouth.    [provider]  ibuprofen (ADVIL,MOTRIN) 200 MG tablet Take 400 mg by mouth every 8 (eight) hours as needed for headache or mild pain.    [provider]  ipratropium (ATROVENT) 0.06 % nasal spray Place 2 sprays into  both nostrils 2 (two) times daily as needed for rhinitis.     [provider]  levothyroxine (SYNTHROID, LEVOTHROID) 50 MCG tablet Take 50 mcg by mouth daily before breakfast.    [provider]  methotrexate (RHEUMATREX) 2.5 MG tablet Take 15 mg by mouth every Monday.  05/23/17   [provider]  montelukast (SINGULAIR) 10 MG tablet Take 10 mg by mouth daily. 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    [provider]  nitroGLYCERIN (NITROSTAT) 0.4 MG SL tablet Place 0.4 mg under the tongue every 5 (five) minutes as needed for chest pain. 04/27/17   [provider]  nystatin (NYSTATIN) powder Apply 1 g topically 3 (three) times daily as needed. Irritation under breast and groin area     [provider]  ondansetron (ZOFRAN) 4 MG tablet Take 4 mg by mouth every 6 (six) hours as needed for nausea or vomiting.    [provider]  predniSONE (DELTASONE) 10 MG tablet Take 3 tablets once a day for the next  4 days. 07/28/17   Johnn Hai, PA-C  ranitidine (ZANTAC) 150 MG tablet Take 150 mg by mouth 2 (two) times daily. 04/27/17   [provider]     Allergies Codeine and Other   Family History  Problem Relation Age of Onset  . Breast cancer Paternal Aunt   . Other Father   . Heart attack Father     Social History Social History   Tobacco Use  . Smoking status: Former Smoker    Types: Cigarettes    Last attempt to quit: 02/01/2013    Years since quitting: 4.8  . Smokeless tobacco: Never Used  Substance Use Topics  . Alcohol use: No  . Drug use: No    Review of Systems  Constitutional:   No fever or chills.  ENT:   No sore throat. No rhinorrhea. Cardiovascular:   No chest pain or syncope. Respiratory:   No dyspnea or cough. Gastrointestinal: Positive as above abdominal  pain and diarrhea.  No vomiting Musculoskeletal:   Negative for focal pain or swelling All other systems reviewed and are negative except as documented above in ROS and HPI.  ____________________________________________   PHYSICAL EXAM:  VITAL SIGNS: ED Triage Vitals  Enc Vitals Group     BP 12/25/17 1324 112/77     Pulse Rate 12/25/17 1324 66     Resp 12/25/17 1324 18     Temp 12/25/17 1324 98.7 F (37.1 C)     Temp Source 12/25/17 1324 Oral     SpO2 12/25/17 1324 97 %     Weight 12/25/17 1325 267 lb (121.1 kg)     Height 12/25/17 1325 5\' 4"  (1.626 m)     Head Circumference --      Peak Flow --      Pain Score 12/25/17 1324 6     Pain Loc --      Pain Edu? --      Excl. in [place]? --     Vital signs reviewed, nursing assessments reviewed.   Constitutional:   Alert and oriented. Non-toxic appearance. Eyes:   Conjunctivae are normal. EOMI. PERRL. ENT      Head:   Normocephalic and atraumatic.      Nose:   No congestion/rhinnorhea.       Mouth/Throat:   MMM, no pharyngeal erythema. No peritonsillar mass.       Neck:   No meningismus. Full ROM. Hematological/Lymphatic/Immunilogical:   No cervical lymphadenopathy. Cardiovascular:   RRR. Symmetric bilateral radial and DP pulses.  No murmurs. Cap refill less than 2 seconds. Respiratory:   Normal respiratory effort without tachypnea/retractions. Breath sounds are clear and equal bilaterally. No wheezes/rales/rhonchi. Gastrointestinal:   Soft with generalized abdominal pain, worse in the right lower quadrant.  Pronounced tenderness over the suprapubic area.. Non distended. There is no CVA tenderness.  No rebound, rigidity, or guarding.  Musculoskeletal:   Normal range of motion in all extremities. No joint effusions.  No lower extremity tenderness.  No edema. Neurologic:   Normal speech and language.  Motor grossly intact. No acute focal neurologic deficits are appreciated.  Skin:    Skin is warm, dry and intact. No rash noted.   No petechiae, purpura, or bullae.  ____________________________________________    LABS (pertinent positives/negatives) (all labs ordered are listed, but only abnormal results are displayed) Labs Reviewed  URINALYSIS, COMPLETE (UACMP) WITH MICROSCOPIC - Abnormal; Notable for the following components:      Result Value   Color, Urine YELLOW (*)  APPearance CLEAR (*)    Leukocytes, UA MODERATE (*)    All other components within normal limits  COMPREHENSIVE METABOLIC PANEL - Abnormal; Notable for the following components:   Glucose, Bld 125 (*)    All other components within normal limits  CBC - Abnormal; Notable for the following components:   RDW 15.3 (*)    All other components within normal limits  URINE CULTURE  LIPASE, BLOOD   ____________________________________________   EKG  Interpreted by me Atrial fibrillation, rate of 70.  Normal axis and intervals.  Normal ST segments and T waves.  Voltage criteria for LVH in the high lateral leads with associated repolarization abnormality.  ____________________________________________    RADIOLOGY  No results found.  ____________________________________________   PROCEDURES Procedures  ____________________________________________  DIFFERENTIAL DIAGNOSIS   Enteritis, viral syndrome, appendicitis, cholecystitis, liver disease, pancreatitis, diverticulitis, or urinary tract infection  CLINICAL IMPRESSION / ASSESSMENT AND PLAN / ED COURSE  Pertinent labs & imaging results that were available during my care of the patient were reviewed by me and considered in my medical decision making (see chart for details).    Patient presents with abdominal pain.  Vital signs are unremarkable.  Initial labs are all normal, but exam does show significant tenderness somewhat localizing to the right lower quadrant.  Due to age and comorbidities and broad differential, will obtain a CT scan for further evaluation.  IV Pepcid and Zofran  for symptom control.  We will start the patient on antibiotics for urinary tract infection based on symptoms and bacteriuria.      ____________________________________________   FINAL CLINICAL IMPRESSION(S) / ED DIAGNOSES    Final diagnoses:  Generalized abdominal pain     ED Discharge Orders    None      Portions of this note were generated with dragon dictation software. Dictation errors [place] occur despite best attempts at proofreading.    Carrie Mew, MD 12/25/17 [id number]

## 2017-12-25 NOTE — ED Triage Notes (Signed)
Patient presents to the ED with upper abdominal pain x 5 days.  Reports nausea, denies vomiting.  Patient reports occasional diarrhea.  Patient also reports feeling dizzy.  Patient describes pain as burning and cramping.

## 2017-12-25 NOTE — ED Provider Notes (Signed)
-----------------------------------------   8:12 PM on 12/25/2017 -----------------------------------------  Patient CT scan shows a significant stool burden otherwise negative.  We will discharge with Keflex.  I discussed with the patient using MiraLAX if she becomes constipated.  Patient otherwise will follow-up with her primary care doctor.  Patient agreeable to plan of care.   Harvest Dark, MD 12/25/17 2012

## 2017-12-27 LAB — URINE CULTURE

## 2018-01-16 MED ORDER — METHOTREXATE SODIUM 2.5 MG TABLET
ORAL_TABLET | 0 refills | 0.00000 days | Status: CP
Start: 2018-01-16 — End: ?

## 2018-02-10 ENCOUNTER — Emergency Department: Payer: Medicare HMO

## 2018-02-10 ENCOUNTER — Emergency Department
Admission: EM | Admit: 2018-02-10 | Discharge: 2018-02-10 | Disposition: A | Discharge status: Home / Self Care | Payer: Medicare HMO | Attending: Emergency Medicine | Admitting: Emergency Medicine

## 2018-02-10 ENCOUNTER — Encounter: Payer: Self-pay | Admitting: Emergency Medicine

## 2018-02-10 DIAGNOSIS — Z79899 Other long term (current) drug therapy: Secondary | ICD-10-CM | POA: Diagnosis not present

## 2018-02-10 DIAGNOSIS — S9031XA Contusion of right foot, initial encounter: Secondary | ICD-10-CM | POA: Diagnosis not present

## 2018-02-10 DIAGNOSIS — I252 Old myocardial infarction: Secondary | ICD-10-CM | POA: Insufficient documentation

## 2018-02-10 DIAGNOSIS — I2510 Atherosclerotic heart disease of native coronary artery without angina pectoris: Secondary | ICD-10-CM | POA: Diagnosis not present

## 2018-02-10 DIAGNOSIS — I10 Essential (primary) hypertension: Secondary | ICD-10-CM | POA: Insufficient documentation

## 2018-02-10 DIAGNOSIS — Y92018 Other place in single-family (private) house as the place of occurrence of the external cause: Secondary | ICD-10-CM | POA: Diagnosis not present

## 2018-02-10 DIAGNOSIS — Y9389 Activity, other specified: Secondary | ICD-10-CM | POA: Insufficient documentation

## 2018-02-10 DIAGNOSIS — Y998 Other external cause status: Secondary | ICD-10-CM | POA: Diagnosis not present

## 2018-02-10 DIAGNOSIS — Z87891 Personal history of nicotine dependence: Secondary | ICD-10-CM | POA: Insufficient documentation

## 2018-02-10 DIAGNOSIS — Z7901 Long term (current) use of anticoagulants: Secondary | ICD-10-CM | POA: Diagnosis not present

## 2018-02-10 DIAGNOSIS — W228XXA Striking against or struck by other objects, initial encounter: Secondary | ICD-10-CM | POA: Diagnosis not present

## 2018-02-10 DIAGNOSIS — E039 Hypothyroidism, unspecified: Secondary | ICD-10-CM | POA: Insufficient documentation

## 2018-02-10 DIAGNOSIS — S99921A Unspecified injury of right foot, initial encounter: Secondary | ICD-10-CM | POA: Diagnosis present

## 2018-02-10 MED ORDER — HYDROCODONE-ACETAMINOPHEN 5-325 MG PO TABS: 1.0000 | ORAL_TABLET | Freq: Three times a day (TID) | ORAL | 0 refills | Status: AC | PRN

## 2018-02-10 MED ORDER — OXYCODONE-ACETAMINOPHEN 5-325 MG PO TABS
1.0000 | ORAL_TABLET | ORAL | Status: DC | PRN
  Administered 2018-02-10: 1 via ORAL

## 2018-02-10 MED ORDER — OXYCODONE-ACETAMINOPHEN 5-325 MG PO TABS
ORAL_TABLET | ORAL | Status: AC
  Filled 2018-02-10: qty 1

## 2018-02-10 NOTE — ED Provider Notes (Signed)
[facility] Emergency Department Provider Note ____________________________________________  Time seen: 2320  I have reviewed the triage vital signs and the nursing notes.  HISTORY  Chief Complaint  Foot Pain  HPI Patricia Walker is a 72 y.o. female with medical history as noted below, who presents to the ED accompanied by family, for evaluation and management of acute right foot pain.  Patient describes she was at home, attempting to lift a heavy coffee table and correct the rug that was folded up underneath it.  Doing so, she accidentally took the table too far, and it fell hitting her across the dorsal aspect of the right foot.  She presents now with significant pain, swelling, and bruising to the top of the right foot.  She denies any other injury at this time.  Patient has been unable to bear weight to the foot without pain or disability.  Past Medical History:  Diagnosis Date  . Coronary artery disease    mild, nonobstructive  . Dysrhythmia    Atrial Fibrillation  . GERD (gastroesophageal reflux disease)   . Hyperlipidemia   . Hypertension   . Hypothyroidism   . 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 Morbid obesity ([place])   . Persistent atrial fibrillation   . Psoriasis   . Sleep apnea    compliant with CPAP  . Thyroid disease     Patient Active Problem List   Diagnosis Date Noted  . Unstable angina ([place]) 06/13/2017  . New onset of headaches 06/12/2017  . Photophobia of right eye 06/12/2017  . Osteoarthritis of knee 11/17/2016  . Coronary artery disease 09/16/2016  . History of cardioversion 09/16/2016  . Hyperlipidemia, unspecified 09/16/2016  . Hypertension 09/16/2016  . Obesity, unspecified 09/16/2016  . Atrial fibrillation ([place]) 09/08/2016  . Cardiac arrhythmia 09/09/2015  . Gastroesophageal reflux disease 09/09/2015  . Obstructive sleep apnea syndrome 09/09/2015  . Psoriasis 09/09/2015    Past Surgical History:  Procedure  Laterality Date  . ARTERY BIOPSY Right 07/21/2017   Procedure: BIOPSY TEMPORAL ARTERY;  Surgeon: [name], MD;  Location: [facility] ORS;  Service: Vascular;  Laterality: Right;  . CARDIAC CATHETERIZATION    . CARDIOVERSION Right 09/01/2016   Procedure: Cardioversion;  Surgeon: [name], MD;  Location: [facility] ORS;  Service: Cardiovascular;  Laterality: Right;  . CARDIOVERSION N/A 09/09/2016   Procedure: Cardioversion;  Surgeon: [name], MD;  Location: [facility] ORS;  Service: Cardiovascular;  Laterality: N/A;  . ELECTROPHYSIOLOGIC STUDY N/A 02/01/2016   Procedure: CARDIOVERSION;  Surgeon: [name], MD;  Location: [facility] ORS;  Service: Cardiovascular;  Laterality: N/A;  . ESOPHAGEAL DILATION    . ESOPHAGOGASTRODUODENOSCOPY (EGD) WITH PROPOFOL N/A 02/06/2017   Procedure: ESOPHAGOGASTRODUODENOSCOPY (EGD) WITH PROPOFOL;  Surgeon: [name], MD;  Location[facility] ENDOSCOPY;  Service: Gastroenterology;  Laterality: N/A;  . EUS N/A 02/16/2017   Procedure: FULL UPPER ENDOSCOPIC ULTRASOUND (EUS) RADIAL;  Surgeon: [name], MD;  Location: [facility] ENDOSCOPY;  Service: Gastroenterology;  Laterality: N/A;  . LEFT HEART CATH AND CORONARY ANGIOGRAPHY N/A 09/08/2016   Procedure: Left Heart Cath and Coronary Angiography;  Surgeon: [name], MD;  Location: [place] CV LAB;  Service: Cardiovascular;  Laterality: N/A;  . US ECHOCARDIOGRAPHY      Prior to Admission medications   Medication Sig Start Date End Date Taking? Authorizing Provider  albuterol (PROVENTIL HFA;VENTOLIN HFA) 108 (90 Base) MCG/ACT inhaler Inhale 2 puffs into the lungs every 6 (six) hours  as needed for wheezing or shortness of breath.    [provider]  apixaban (ELIQUIS) 5 MG TABS tablet Take 5 mg by mouth 2 (two) times daily.    [provider]  atorvastatin (LIPITOR) 20 MG tablet Take 20 mg by mouth every evening. 08/25/16   [provider]  azelastine (ASTELIN) 0.1 % nasal spray Place  2 sprays into both nostrils 2 (two) times daily as needed for rhinitis or allergies.  10/13/16   [provider]  baclofen (LIORESAL) 10 MG tablet Take 10 mg by mouth 2 (two) times daily. 06/13/17   [provider]  budesonide-formoterol (SYMBICORT) 160-4.5 MCG/ACT inhaler Inhale 2 puffs into the lungs 2 (two) times daily as needed (for shortness of breath).     [provider]  butalbital-acetaminophen-caffeine (FIORICET WITH CODEINE) 50-325-40-30 MG capsule Take 1 capsule by mouth every 6 (six) hours as needed for migraine. 06/13/17   [provider]  calcium carbonate (TUMS - DOSED IN MG ELEMENTAL CALCIUM) 500 MG chewable tablet Chew 0.5 tablets by mouth 3 (three) times daily as needed for indigestion or heartburn.     [provider]  cetirizine (ZYRTEC) 10 MG tablet Take 10 mg by mouth daily as needed for allergies.    [provider]  Cholecalciferol (VITAMIN D3) 50000 units CAPS Take 50,000 Units by mouth every Monday.  08/24/16   [provider]  esomeprazole (NEXIUM) 40 MG capsule Take 40 mg by mouth daily. 07/01/17   [provider]  folic acid (FOLVITE) 1 MG tablet Take 3 mg by mouth See admin instructions. Takes 3 tablets (3mg ) daily except for Mondays.    [provider]  furosemide (LASIX) 20 MG tablet Take 20 mg by mouth daily as needed for fluid. 08/24/16   [provider]  hydrochlorothiazide (HYDRODIURIL) 25 MG tablet Take by mouth.    [provider]  HYDROcodone-acetaminophen (NORCO) 5-325 MG tablet Take 1 tablet by mouth 3 (three) times daily as needed for up to 3 days. 02/10/18 02/13/18  Santrice Muzio, Dannielle Karvonen, PA-C  hydrOXYzine (ATARAX/VISTARIL) 25 MG tablet Take by mouth.    [provider]  ipratropium (ATROVENT) 0.06 % nasal spray Place 2 sprays into both nostrils 2 (two) times daily as needed for rhinitis.     [provider]  levothyroxine (SYNTHROID, LEVOTHROID) 50 MCG  tablet Take 50 mcg by mouth daily before breakfast.    [provider]  methotrexate (RHEUMATREX) 2.5 MG tablet Take 15 mg by mouth every Monday.  05/23/17   [provider]  montelukast (SINGULAIR) 10 MG tablet Take 10 mg by mouth daily. 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    [provider]  nitroGLYCERIN (NITROSTAT) 0.4 MG SL tablet Place 0.4 mg under the tongue every 5 (five) minutes as needed for chest pain. 04/27/17   [provider]  nystatin (NYSTATIN) powder Apply 1 g topically 3 (three) times daily as needed. Irritation under breast and groin area     [provider]  ondansetron (ZOFRAN ODT) 4 MG disintegrating tablet Take 1 tablet (4 mg total) by mouth every 8 (eight) hours as needed for nausea or vomiting. 12/25/17   Carrie Mew, MD  ondansetron (ZOFRAN) 4 MG tablet Take 4 mg by mouth every 6 (six) hours as needed for nausea or vomiting.    [provider]  ranitidine (ZANTAC) 150 MG tablet Take 150 mg by mouth 2 (two) times daily. 04/27/17   [provider]    Allergies Codeine and Other  Family History  Problem Relation Age of Onset  . Breast cancer Paternal Aunt   . Other Father   . Heart attack Father     Social History Social History   Tobacco Use  . Smoking status: Former Smoker    Types: Cigarettes    Last attempt to quit: 02/01/2013    Years since quitting: 5.0  . Smokeless tobacco: Never Used  Substance Use Topics  . Alcohol use: No  . Drug use: No    Review of Systems  Constitutional: Negative for fever. Cardiovascular: Negative for chest pain. Respiratory: Negative for shortness of breath. Musculoskeletal: Negative for back pain.  Right foot pain as above. Skin: Negative for rash. Neurological: Negative for headaches, focal weakness or  numbness. ____________________________________________  PHYSICAL EXAM:  VITAL SIGNS: ED Triage Vitals  Enc Vitals Group     BP 02/10/18 2208 123/85     Pulse Rate 02/10/18 2206 90     Resp 02/10/18 2206 19     Temp 02/10/18 2206 97.9 F (36.6 C)     Temp Source 02/10/18 2206 Oral     SpO2 02/10/18 2206 99 %     Weight 02/10/18 2207 268 lb (121.6 kg)     Height 02/10/18 2207 5\' 4"  (1.626 m)     Head Circumference --      Peak Flow --      Pain Score 02/10/18 2207 9     Pain Loc --      Pain Edu? --      Excl. in [place]? --     Constitutional: Alert and oriented. Well appearing and in no distress. Head: Normocephalic and atraumatic. Cardiovascular: Normal rate, regular rhythm. Normal distal pulses capillary refill.[name] Kitchen Respiratory: Normal respiratory effort. No wheezes/rales/rhonchi. Musculoskeletal: Right foot without any obvious deformity or dislocation.  Patient with subtle soft tissue swelling to the dorsum of the foot.  There is early ecchymosis noted and suspected hematoma due to significant soft tissue swelling.  Patient with normal ankle range of motion on exam.  Nontender with normal range of motion in all extremities.  Neurologic:  Normal gross sensation. Normal speech and language. No gross focal neurologic deficits are appreciated. Skin:  Skin is warm, dry and intact. No rash noted. Psoriasis noted ____________________________________________   RADIOLOGY  Right Foot  Negative  I, Denilson Salminen, Dannielle Karvonen, personally viewed and evaluated these images (plain radiographs) as part of my medical decision making, as well as reviewing the written report by the radiologist. ____________________________________________  PROCEDURES  Procedures Oxycodone 5-325 mg PO Post-op shoe ____________________________________________  INITIAL IMPRESSION / ASSESSMENT AND PLAN / ED COURSE  Patient with ED evaluation of an accidental contusion to the right foot leading to a large  hematoma to the dorsum of the right foot.  No radiologic evidence of acute fracture or dislocation.  Patient is placed in a postop shoe for comfort.  She will use a walker she has at home to ambulate.  A prescription for #6 hydrocodone was provided for acute pain relief.  She will follow with primary provider for ongoing symptom management.  I reviewed the patient's prescription history over the last 12 months in the multi-state controlled substances database(s) that includes [place], [place], [place], [place], [place], [place], [place], [place], [place], [place], [place], [place], [place], and [place].  Results were notable for no current prescriptions.  ____________________________________________  FINAL CLINICAL IMPRESSION(S) / ED DIAGNOSES  Final diagnoses:  Contusion of right foot, initial encounter      Melvenia Needles, PA-C 02/10/18 2345    Earleen Newport, MD 02/10/18 2356

## 2018-02-10 NOTE — Discharge Instructions (Signed)
Your exam and x-ray are consistent with a foot contusion. There is no evidence of foot fracture. Rest with the foot elevated when seated. Apply ice to reduce pain and swelling. Wear the post-op shoe ot ambulate, and use your walker for support. Take the pain medicine as needed. Follow-up with Dr. [name] for ongoing symptoms.

## 2018-02-10 NOTE — ED Triage Notes (Signed)
Patient states that she dropped a coffee table on her right foot about 45 minutes ago. Patient with significant swelling to the top of her foot. Patient with positive pedal pulse with doppler.

## 2018-02-26 ENCOUNTER — Ambulatory Visit
Admission: RE | Admit: 2018-02-26 | Discharge: 2018-02-26 | Disposition: A | Discharge status: Home / Self Care | Payer: Medicare HMO | Source: Ambulatory Visit | Attending: Family | Admitting: Family

## 2018-02-26 ENCOUNTER — Other Ambulatory Visit: Payer: Self-pay | Admitting: Family

## 2018-02-26 DIAGNOSIS — M5440 Lumbago with sciatica, unspecified side: Secondary | ICD-10-CM

## 2018-02-26 DIAGNOSIS — M5136 Other intervertebral disc degeneration, lumbar region: Secondary | ICD-10-CM | POA: Insufficient documentation

## 2018-03-27 ENCOUNTER — Emergency Department: Payer: Medicare HMO

## 2018-03-27 ENCOUNTER — Emergency Department
Admission: EM | Admit: 2018-03-27 | Discharge: 2018-03-27 | Disposition: A | Discharge status: Home / Self Care | Payer: Medicare HMO | Attending: Emergency Medicine | Admitting: Emergency Medicine

## 2018-03-27 ENCOUNTER — Encounter: Payer: Self-pay | Admitting: Emergency Medicine

## 2018-03-27 ENCOUNTER — Other Ambulatory Visit: Payer: Self-pay

## 2018-03-27 DIAGNOSIS — M79605 Pain in left leg: Secondary | ICD-10-CM

## 2018-03-27 DIAGNOSIS — E785 Hyperlipidemia, unspecified: Secondary | ICD-10-CM | POA: Insufficient documentation

## 2018-03-27 DIAGNOSIS — I252 Old myocardial infarction: Secondary | ICD-10-CM | POA: Insufficient documentation

## 2018-03-27 DIAGNOSIS — I10 Essential (primary) hypertension: Secondary | ICD-10-CM | POA: Diagnosis not present

## 2018-03-27 DIAGNOSIS — I2510 Atherosclerotic heart disease of native coronary artery without angina pectoris: Secondary | ICD-10-CM | POA: Insufficient documentation

## 2018-03-27 DIAGNOSIS — M545 Low back pain, unspecified: Secondary | ICD-10-CM

## 2018-03-27 DIAGNOSIS — Z87891 Personal history of nicotine dependence: Secondary | ICD-10-CM | POA: Insufficient documentation

## 2018-03-27 DIAGNOSIS — R600 Localized edema: Secondary | ICD-10-CM | POA: Diagnosis not present

## 2018-03-27 DIAGNOSIS — E039 Hypothyroidism, unspecified: Secondary | ICD-10-CM | POA: Insufficient documentation

## 2018-03-27 DIAGNOSIS — Z79899 Other long term (current) drug therapy: Secondary | ICD-10-CM | POA: Diagnosis not present

## 2018-03-27 DIAGNOSIS — I4819 Other persistent atrial fibrillation: Secondary | ICD-10-CM | POA: Diagnosis not present

## 2018-03-27 DIAGNOSIS — R079 Chest pain, unspecified: Secondary | ICD-10-CM | POA: Diagnosis not present

## 2018-03-27 DIAGNOSIS — M79604 Pain in right leg: Secondary | ICD-10-CM | POA: Diagnosis not present

## 2018-03-27 DIAGNOSIS — Z7901 Long term (current) use of anticoagulants: Secondary | ICD-10-CM | POA: Insufficient documentation

## 2018-03-27 LAB — BASIC METABOLIC PANEL
Anion gap: 9 (ref 5–15)
BUN: 26 mg/dL — ABNORMAL HIGH (ref 8–23)
CALCIUM: 9.7 mg/dL (ref 8.9–10.3)
CO2: 26 mmol/L (ref 22–32)
Chloride: 102 mmol/L (ref 98–111)
Creatinine, Ser: 1.02 mg/dL — ABNORMAL HIGH (ref 0.44–1.00)
GFR, EST NON AFRICAN AMERICAN: 55 mL/min — AB (ref >60)
GLUCOSE: 100 mg/dL — AB (ref 70–99)
Potassium: 4 mmol/L (ref 3.5–5.1)
SODIUM: 137 mmol/L (ref 135–145)

## 2018-03-27 LAB — CBC
HCT: 42.8 % (ref 36.0–46.0)
Hemoglobin: 14 g/dL (ref 12.0–15.0)
MCH: 28.5 pg (ref 26.0–34.0)
MCHC: 32.7 g/dL (ref 30.0–36.0)
MCV: 87 fL (ref 80.0–100.0)
NRBC: 0 % (ref 0.0–0.2)
PLATELETS: 302 10*3/uL (ref 150–400)
RBC: 4.92 MIL/uL (ref 3.87–5.11)
RDW: 13 % (ref 11.5–15.5)
WBC: 6.6 10*3/uL (ref 4.0–10.5)

## 2018-03-27 LAB — TROPONIN I

## 2018-03-27 MED ORDER — TRAMADOL HCL 50 MG PO TABS: 50.0000 mg | ORAL_TABLET | Freq: Four times a day (QID) | ORAL | 0 refills | Status: DC | PRN

## 2018-03-27 MED ORDER — TRAMADOL HCL 50 MG PO TABS
100.0000 mg | ORAL_TABLET | Freq: Once | ORAL | Status: AC
  Administered 2018-03-27: 100 mg via ORAL
  Filled 2018-03-27: qty 2

## 2018-03-27 NOTE — ED Notes (Signed)
Pt given warm blanket by registration.

## 2018-03-27 NOTE — ED Provider Notes (Signed)
[facility] Emergency Department Provider Note  Time seen: 5:58 PM  I have reviewed the triage vital signs and the nursing notes.   HISTORY  Chief Complaint Back Pain; Leg Pain; and Chest Pain    HPI Patricia Walker is a 72 y.o. female with a past medical history of CAD, gastric reflux, hypertension, hyperlipidemia, presents to the emergency department for bilateral lower extremity pain.  According to the patient for the past several weeks she has been experiencing increased back pain although states this is a chronic issue times many years.  States she has been referred to a spine specialist and will be following up with a spine specialist.  States however over the past for 5 days she has been having difficulty sleeping because of pain in both of her legs.  She describes the pain is mostly in both of her hips and down the outside of both of her legs.  States a history of chronic leg pain as well, has been referred to podiatry, which she is going to follow-up with in the future.  States the main reason she came today was because she has been experiencing swelling in her legs which she believes is causing the increased pain.  Believes the right leg is more swollen than the left.  Patient also states last night she developed chest pain, denies any currently but states she often gets chest pain and this is chronic for her as well.   Past Medical History:  Diagnosis Date  . Coronary artery disease    mild, nonobstructive  . Dysrhythmia    Atrial Fibrillation  . GERD (gastroesophageal reflux disease)   . Hyperlipidemia   . Hypertension   . Hypothyroidism   . 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 Morbid obesity ([place])   . Persistent atrial fibrillation   . Psoriasis   . Sleep apnea    compliant with CPAP  . Thyroid disease     Patient Active Problem List   Diagnosis Date Noted  . Unstable angina ([place]) 06/13/2017  . New onset of headaches 06/12/2017  .  Photophobia of right eye 06/12/2017  . Osteoarthritis of knee 11/17/2016  . Coronary artery disease 09/16/2016  . History of cardioversion 09/16/2016  . Hyperlipidemia, unspecified 09/16/2016  . Hypertension 09/16/2016  . Obesity, unspecified 09/16/2016  . Atrial fibrillation ([place]) 09/08/2016  . Cardiac arrhythmia 09/09/2015  . Gastroesophageal reflux disease 09/09/2015  . Obstructive sleep apnea syndrome 09/09/2015  . Psoriasis 09/09/2015    Past Surgical History:  Procedure Laterality Date  . ARTERY BIOPSY Right 07/21/2017   Procedure: BIOPSY TEMPORAL ARTERY;  Surgeon: [name], MD;  Location: [facility] ORS;  Service: Vascular;  Laterality: Right;  . CARDIAC CATHETERIZATION    . CARDIOVERSION Right 09/01/2016   Procedure: Cardioversion;  Surgeon: [name], MD;  Location: [facility] ORS;  Service: Cardiovascular;  Laterality: Right;  . CARDIOVERSION N/A 09/09/2016   Procedure: Cardioversion;  Surgeon: [name], MD;  Location: [facility] ORS;  Service: Cardiovascular;  Laterality: N/A;  . ELECTROPHYSIOLOGIC STUDY N/A 02/01/2016   Procedure: CARDIOVERSION;  Surgeon: [name], MD;  Location: [facility] ORS;  Service: Cardiovascular;  Laterality: N/A;  . ESOPHAGEAL DILATION    . ESOPHAGOGASTRODUODENOSCOPY (EGD) WITH PROPOFOL N/A 02/06/2017   Procedure: ESOPHAGOGASTRODUODENOSCOPY (EGD) WITH PROPOFOL;  Surgeon: [name], MD;  Location: [facility] ENDOSCOPY;  Service: Gastroenterology;  Laterality: N/A;  . EUS N/A 02/16/2017   Procedure: FULL UPPER ENDOSCOPIC ULTRASOUND (EUS)  RADIAL;  Surgeon: [name], MD;  Location: [facility] ENDOSCOPY;  Service: Gastroenterology;  Laterality: N/A;  . LEFT HEART CATH AND CORONARY ANGIOGRAPHY N/A 09/08/2016   Procedure: Left Heart Cath and Coronary Angiography;  Surgeon: [name], MD;  Location: [place] CV LAB;  Service: Cardiovascular;  Laterality: N/A;  . US ECHOCARDIOGRAPHY      Prior to Admission medications   Medication Sig Start  Date End Date Taking? Authorizing Provider  albuterol (PROVENTIL HFA;VENTOLIN HFA) 108 (90 Base) MCG/ACT inhaler Inhale 2 puffs into the lungs every 6 (six) hours as needed for wheezing or shortness of breath.    [provider]  apixaban (ELIQUIS) 5 MG TABS tablet Take 5 mg by mouth 2 (two) times daily.    [provider]  atorvastatin (LIPITOR) 20 MG tablet Take 20 mg by mouth every evening. 08/25/16   [provider]  azelastine (ASTELIN) 0.1 % nasal spray Place 2 sprays into both nostrils 2 (two) times daily as needed for rhinitis or allergies.  10/13/16   [provider]  baclofen (LIORESAL) 10 MG tablet Take 10 mg by mouth 2 (two) times daily. 06/13/17   [provider]  budesonide-formoterol (SYMBICORT) 160-4.5 MCG/ACT inhaler Inhale 2 puffs into the lungs 2 (two) times daily as needed (for shortness of breath).     [provider]  butalbital-acetaminophen-caffeine (FIORICET WITH CODEINE) 50-325-40-30 MG capsule Take 1 capsule by mouth every 6 (six) hours as needed for migraine. 06/13/17   [provider]  calcium carbonate (TUMS - DOSED IN MG ELEMENTAL CALCIUM) 500 MG chewable tablet Chew 0.5 tablets by mouth 3 (three) times daily as needed for indigestion or heartburn.     [provider]  cetirizine (ZYRTEC) 10 MG tablet Take 10 mg by mouth daily as needed for allergies.    [provider]  Cholecalciferol (VITAMIN D3) 50000 units CAPS Take 50,000 Units by mouth every Monday.  08/24/16   [provider]  esomeprazole (NEXIUM) 40 MG capsule Take 40 mg by mouth daily. 07/01/17   [provider]  folic acid (FOLVITE) 1 MG tablet Take 3 mg by mouth See admin instructions. Takes 3 tablets (3mg ) daily except for Mondays.    [provider]  furosemide (LASIX) 20 MG tablet Take 20 mg by mouth daily as needed for fluid. 08/24/16   [provider]  hydrochlorothiazide (HYDRODIURIL) 25 MG tablet  Take by mouth.    [provider]  hydrOXYzine (ATARAX/VISTARIL) 25 MG tablet Take by mouth.    [provider]  ipratropium (ATROVENT) 0.06 % nasal spray Place 2 sprays into both nostrils 2 (two) times daily as needed for rhinitis.     [provider]  levothyroxine (SYNTHROID, LEVOTHROID) 50 MCG tablet Take 50 mcg by mouth daily before breakfast.    [provider]  methotrexate (RHEUMATREX) 2.5 MG tablet Take 15 mg by mouth every Monday.  05/23/17   [provider]  montelukast (SINGULAIR) 10 MG tablet Take 10 mg by mouth daily. 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    [provider]  nitroGLYCERIN (NITROSTAT) 0.4 MG SL tablet Place 0.4 mg under the tongue every 5 (five) minutes as needed for chest pain. 04/27/17   [provider]  nystatin (NYSTATIN) powder Apply 1 g topically 3 (three) times daily as needed. Irritation under breast and  groin area     [provider]  ondansetron (ZOFRAN ODT) 4 MG disintegrating tablet Take 1 tablet (4 mg total) by mouth every 8 (eight) hours as needed for nausea or vomiting. 12/25/17   Carrie Mew, MD  ondansetron (ZOFRAN) 4 MG tablet Take 4 mg by mouth every 6 (six) hours as needed for nausea or vomiting.    [provider]  ranitidine (ZANTAC) 150 MG tablet Take 150 mg by mouth 2 (two) times daily. 04/27/17   [provider]    Allergies  Allergen Reactions  . Codeine Itching  . Other Itching and Other (See Comments)    States antibiotic in the past caused itching but can not remember name    Family History  Problem Relation Age of Onset  . Breast cancer Paternal Aunt   . Other Father   . Heart attack Father     Social History Social History   Tobacco Use  . Smoking status: Former Smoker    Types: Cigarettes     Last attempt to quit: 02/01/2013    Years since quitting: 5.1  . Smokeless tobacco: Never Used  Substance Use Topics  . Alcohol use: No  . Drug use: No    Review of Systems Constitutional: Negative for fever Cardiovascular: Chest pain yesterday Respiratory: Negative for shortness of breath. Gastrointestinal: Negative for abdominal pain Musculoskeletal: Bilateral lower extremity pain, with increased swelling in the right lower extremity Skin: Negative for skin complaints  Neurological: Negative for headache All other ROS negative  ____________________________________________   PHYSICAL EXAM:  VITAL SIGNS: ED Triage Vitals  Enc Vitals Group     BP 03/27/18 1457 116/84     Pulse Rate 03/27/18 1457 69     Resp 03/27/18 1457 16     Temp 03/27/18 1457 98.2 F (36.8 C)     Temp Source 03/27/18 1457 Oral     SpO2 03/27/18 1457 97 %     Weight 03/27/18 1459 267 lb (121.1 kg)     Height 03/27/18 1459 5\' 4"  (1.626 m)     Head Circumference --      Peak Flow --      Pain Score 03/27/18 1459 7     Pain Loc --      Pain Edu? --      Excl. in [place]? --    Constitutional: Alert and oriented. Well appearing and in no distress. Eyes: Normal exam ENT   Head: Normocephalic and atraumatic.   Mouth/Throat: Mucous membranes are moist. Cardiovascular: Normal rate, regular rhythm. No murmur Respiratory: Normal respiratory effort without tachypnea nor retractions. Breath sounds are clear Gastrointestinal: Soft and nontender. No distention.   Musculoskeletal: Patient has tenderness in both of her lower extremities in the lateral quadriceps down into both feet.  Mild edema bilaterally no significant difference appreciated on my exam.  No erythema.  Neurovascular intact. Neurologic:  Normal speech and language. No gross focal neurologic deficits Skin:  Skin is warm, dry and intact.  Psychiatric: Mood and affect are normal.   ____________________________________________    EKG  EKG  viewed and interpreted by myself shows atrial fibrillation at 72 bpm with a narrow QRS, normal axis, normal intervals, nonspecific ST changes.  ____________________________________________    RADIOLOGY  Chest x-ray is negative  ____________________________________________   INITIAL IMPRESSION / ASSESSMENT AND PLAN / ED COURSE  Pertinent labs & imaging results that were available during my care of the patient were reviewed by me and considered in my  medical decision making (see chart for details).  Patient presents to the emergency department for bilateral leg pain also is complaining of chest pain yesterday which is chronic for the patient as well as lower back pain which is chronic for the patient.  States the main reason she came is because she is having difficulty sleeping for the past 4 days due to pain and swelling.  States the right leg is more swollen than the left leg although not appreciably different on my exam.  Patient's labs are largely within normal limits/baseline for the patient including a negative chest x-ray and a negative troponin.  EKG is nonrevealing.  We will obtain an ultrasound the right lower extremity as precaution.  Overall the patient appears very well, no chest pain currently.  If her ultrasound is negative we will likely discharge a short course of Ultram for discomfort until the patient can be seen by her spine specialist.  Ultrasound is negative for DVT.  We will discharge with a short course of pain medication.  Patient agreeable to plan of care.  ____________________________________________   FINAL CLINICAL IMPRESSION(S) / ED DIAGNOSES  Back pain Bilateral leg pain Peripheral edema Chest pain   Harvest Dark, MD 03/27/18 1857

## 2018-03-27 NOTE — ED Notes (Signed)
Pt ambulated to toilet by self.

## 2018-03-27 NOTE — ED Triage Notes (Signed)
Low back pain for weeks.  Her pcp has set her up appt with someone for that.  Then few days ago her legs started hurting.   Hurts from hips down sides of legs bilaterally.  She is unable to sleep due to pain, as it is wosrse with lying down.

## 2018-03-27 NOTE — ED Notes (Signed)
Pt repeatedly calling out for pain medicine. MD made aware.

## 2018-03-27 NOTE — ED Notes (Signed)
MD at bedside. 

## 2018-03-27 NOTE — ED Triage Notes (Signed)
At end of triage patient stated that she has chest pain since last night like something sitting on chst and cant get a deep breath.  Acuity changed.

## 2018-06-04 DIAGNOSIS — Z6841 Body Mass Index (BMI) 40.0 and over, adult: Secondary | ICD-10-CM | POA: Insufficient documentation

## 2018-06-10 DIAGNOSIS — J189 Pneumonia, unspecified organism: Secondary | ICD-10-CM

## 2018-06-10 HISTORY — DX: Pneumonia, unspecified organism: J18.9

## 2018-06-14 ENCOUNTER — Other Ambulatory Visit: Payer: Self-pay

## 2018-06-14 ENCOUNTER — Encounter: Payer: Self-pay | Admitting: Emergency Medicine

## 2018-06-14 ENCOUNTER — Inpatient Hospital Stay
Admission: EM | Admit: 2018-06-14 | Discharge: 2018-06-18 | DRG: 194 | Disposition: A | Discharge status: Home / Self Care | Payer: Medicare HMO | Attending: Internal Medicine | Admitting: Internal Medicine

## 2018-06-14 ENCOUNTER — Emergency Department: Payer: Medicare HMO

## 2018-06-14 DIAGNOSIS — Z885 Allergy status to narcotic agent status: Secondary | ICD-10-CM

## 2018-06-14 DIAGNOSIS — Z803 Family history of malignant neoplasm of breast: Secondary | ICD-10-CM

## 2018-06-14 DIAGNOSIS — G4733 Obstructive sleep apnea (adult) (pediatric): Secondary | ICD-10-CM | POA: Diagnosis present

## 2018-06-14 DIAGNOSIS — J189 Pneumonia, unspecified organism: Secondary | ICD-10-CM | POA: Diagnosis not present

## 2018-06-14 DIAGNOSIS — E039 Hypothyroidism, unspecified: Secondary | ICD-10-CM | POA: Diagnosis present

## 2018-06-14 DIAGNOSIS — Z7901 Long term (current) use of anticoagulants: Secondary | ICD-10-CM

## 2018-06-14 DIAGNOSIS — I4819 Other persistent atrial fibrillation: Secondary | ICD-10-CM | POA: Diagnosis present

## 2018-06-14 DIAGNOSIS — Z8249 Family history of ischemic heart disease and other diseases of the circulatory system: Secondary | ICD-10-CM

## 2018-06-14 DIAGNOSIS — I2510 Atherosclerotic heart disease of native coronary artery without angina pectoris: Secondary | ICD-10-CM | POA: Diagnosis present

## 2018-06-14 DIAGNOSIS — Z79899 Other long term (current) drug therapy: Secondary | ICD-10-CM

## 2018-06-14 DIAGNOSIS — Z87891 Personal history of nicotine dependence: Secondary | ICD-10-CM

## 2018-06-14 DIAGNOSIS — Z79890 Hormone replacement therapy (postmenopausal): Secondary | ICD-10-CM

## 2018-06-14 DIAGNOSIS — I10 Essential (primary) hypertension: Secondary | ICD-10-CM | POA: Diagnosis present

## 2018-06-14 DIAGNOSIS — J157 Pneumonia due to Mycoplasma pneumoniae: Principal | ICD-10-CM | POA: Diagnosis present

## 2018-06-14 DIAGNOSIS — E871 Hypo-osmolality and hyponatremia: Secondary | ICD-10-CM | POA: Diagnosis present

## 2018-06-14 DIAGNOSIS — J121 Respiratory syncytial virus pneumonia: Secondary | ICD-10-CM | POA: Diagnosis present

## 2018-06-14 DIAGNOSIS — J45909 Unspecified asthma, uncomplicated: Secondary | ICD-10-CM | POA: Diagnosis present

## 2018-06-14 DIAGNOSIS — I252 Old myocardial infarction: Secondary | ICD-10-CM

## 2018-06-14 DIAGNOSIS — K219 Gastro-esophageal reflux disease without esophagitis: Secondary | ICD-10-CM | POA: Diagnosis present

## 2018-06-14 DIAGNOSIS — Z888 Allergy status to other drugs, medicaments and biological substances status: Secondary | ICD-10-CM

## 2018-06-14 DIAGNOSIS — E785 Hyperlipidemia, unspecified: Secondary | ICD-10-CM | POA: Diagnosis present

## 2018-06-14 DIAGNOSIS — Z6841 Body Mass Index (BMI) 40.0 and over, adult: Secondary | ICD-10-CM

## 2018-06-14 DIAGNOSIS — Z7951 Long term (current) use of inhaled steroids: Secondary | ICD-10-CM

## 2018-06-14 LAB — INFLUENZA PANEL BY PCR (TYPE A & B)
INFLAPCR: NEGATIVE
Influenza B By PCR: NEGATIVE

## 2018-06-14 LAB — BASIC METABOLIC PANEL
Anion gap: 8 (ref 5–15)
BUN: 19 mg/dL (ref 8–23)
CALCIUM: 8.2 mg/dL — AB (ref 8.9–10.3)
CO2: 19 mmol/L — AB (ref 22–32)
Chloride: 102 mmol/L (ref 98–111)
Creatinine, Ser: 1.04 mg/dL — ABNORMAL HIGH (ref 0.44–1.00)
GFR, EST NON AFRICAN AMERICAN: 54 mL/min — AB (ref >60)
GLUCOSE: 153 mg/dL — AB (ref 70–99)
POTASSIUM: 3.8 mmol/L (ref 3.5–5.1)
Sodium: 129 mmol/L — ABNORMAL LOW (ref 135–145)

## 2018-06-14 LAB — CBC
HCT: 45.8 % (ref 36.0–46.0)
HEMOGLOBIN: 15.5 g/dL — AB (ref 12.0–15.0)
MCH: 27.5 pg (ref 26.0–34.0)
MCHC: 33.8 g/dL (ref 30.0–36.0)
MCV: 81.3 fL (ref 80.0–100.0)
NRBC: 0 % (ref 0.0–0.2)
PLATELETS: 319 10*3/uL (ref 150–400)
RBC: 5.63 MIL/uL — ABNORMAL HIGH (ref 3.87–5.11)
RDW: 13.3 % (ref 11.5–15.5)
WBC: 11.7 10*3/uL — ABNORMAL HIGH (ref 4.0–10.5)

## 2018-06-14 LAB — TROPONIN I

## 2018-06-14 LAB — PROCALCITONIN: Procalcitonin: 0.17 ng/mL

## 2018-06-14 MED ORDER — SODIUM CHLORIDE 0.9 % IV SOLN
1.0000 g | Freq: Once | INTRAVENOUS | Status: AC
  Administered 2018-06-14: 1 g via INTRAVENOUS
  Filled 2018-06-14: qty 10

## 2018-06-14 MED ORDER — BACLOFEN 10 MG PO TABS
10.0000 mg | ORAL_TABLET | Freq: Two times a day (BID) | ORAL | Status: DC
  Administered 2018-06-15 – 2018-06-18 (×7): 10 mg via ORAL
  Filled 2018-06-14 (×9): qty 1

## 2018-06-14 MED ORDER — ATORVASTATIN CALCIUM 20 MG PO TABS
20.0000 mg | ORAL_TABLET | Freq: Every evening | ORAL | Status: DC
  Administered 2018-06-14 – 2018-06-17 (×4): 20 mg via ORAL
  Filled 2018-06-14 (×4): qty 1

## 2018-06-14 MED ORDER — SODIUM CHLORIDE 0.9 % IV SOLN
INTRAVENOUS | Status: AC
  Administered 2018-06-14: 23:00:00 via INTRAVENOUS

## 2018-06-14 MED ORDER — PANTOPRAZOLE SODIUM 40 MG PO TBEC
40.0000 mg | DELAYED_RELEASE_TABLET | Freq: Every day | ORAL | Status: DC
  Administered 2018-06-14 – 2018-06-18 (×5): 40 mg via ORAL
  Filled 2018-06-14 (×5): qty 1

## 2018-06-14 MED ORDER — IPRATROPIUM-ALBUTEROL 0.5-2.5 (3) MG/3ML IN SOLN
3.0000 mL | Freq: Once | RESPIRATORY_TRACT | Status: AC
  Administered 2018-06-14: 3 mL via RESPIRATORY_TRACT
  Filled 2018-06-14: qty 3

## 2018-06-14 MED ORDER — IPRATROPIUM-ALBUTEROL 0.5-2.5 (3) MG/3ML IN SOLN
3.0000 mL | Freq: Four times a day (QID) | RESPIRATORY_TRACT | Status: DC
  Administered 2018-06-15 – 2018-06-17 (×9): 3 mL via RESPIRATORY_TRACT
  Filled 2018-06-14 (×10): qty 3

## 2018-06-14 MED ORDER — ACETAMINOPHEN 325 MG PO TABS
650.0000 mg | ORAL_TABLET | Freq: Four times a day (QID) | ORAL | Status: DC | PRN
  Administered 2018-06-14 – 2018-06-16 (×5): 650 mg via ORAL
  Filled 2018-06-14 (×5): qty 2

## 2018-06-14 MED ORDER — SODIUM CHLORIDE 0.9 % IV SOLN
1.0000 g | INTRAVENOUS | Status: DC
  Administered 2018-06-15 – 2018-06-17 (×3): 1 g via INTRAVENOUS
  Filled 2018-06-14: qty 10
  Filled 2018-06-14 (×3): qty 1

## 2018-06-14 MED ORDER — PREGABALIN 50 MG PO CAPS: 50.0000 mg | ORAL_CAPSULE | Freq: Every day | ORAL | Status: DC | PRN

## 2018-06-14 MED ORDER — GABAPENTIN 300 MG PO CAPS
300.0000 mg | ORAL_CAPSULE | Freq: Two times a day (BID) | ORAL | Status: DC
  Administered 2018-06-14 – 2018-06-18 (×8): 300 mg via ORAL
  Filled 2018-06-14 (×5): qty 1
  Filled 2018-06-14: qty 3
  Filled 2018-06-14 (×2): qty 1

## 2018-06-14 MED ORDER — POLYETHYLENE GLYCOL 3350 17 G PO PACK
17.0000 g | PACK | Freq: Every day | ORAL | Status: DC | PRN
  Filled 2018-06-14: qty 1

## 2018-06-14 MED ORDER — ALBUTEROL SULFATE (2.5 MG/3ML) 0.083% IN NEBU: 2.5000 mg | INHALATION_SOLUTION | Freq: Four times a day (QID) | RESPIRATORY_TRACT | Status: DC | PRN

## 2018-06-14 MED ORDER — DOXEPIN HCL 25 MG PO CAPS
25.0000 mg | ORAL_CAPSULE | Freq: Every day | ORAL | Status: DC
  Administered 2018-06-14 – 2018-06-17 (×4): 25 mg via ORAL
  Filled 2018-06-14 (×5): qty 1

## 2018-06-14 MED ORDER — ONDANSETRON HCL 4 MG/2ML IJ SOLN: 4.0000 mg | Freq: Four times a day (QID) | INTRAMUSCULAR | Status: DC | PRN

## 2018-06-14 MED ORDER — AZELASTINE HCL 0.1 % NA SOLN
2.0000 | Freq: Two times a day (BID) | NASAL | Status: DC | PRN
  Filled 2018-06-14: qty 30

## 2018-06-14 MED ORDER — ACETAMINOPHEN 650 MG RE SUPP: 650.0000 mg | Freq: Four times a day (QID) | RECTAL | Status: DC | PRN

## 2018-06-14 MED ORDER — LEVOTHYROXINE SODIUM 50 MCG PO TABS
50.0000 ug | ORAL_TABLET | Freq: Every day | ORAL | Status: DC
  Administered 2018-06-15 – 2018-06-18 (×4): 50 ug via ORAL
  Filled 2018-06-14 (×4): qty 1

## 2018-06-14 MED ORDER — METHYLPREDNISOLONE SODIUM SUCC 125 MG IJ SOLR
60.0000 mg | Freq: Two times a day (BID) | INTRAMUSCULAR | Status: DC
  Administered 2018-06-14: 60 mg via INTRAVENOUS
  Filled 2018-06-14: qty 2

## 2018-06-14 MED ORDER — ONDANSETRON HCL 4 MG PO TABS
4.0000 mg | ORAL_TABLET | Freq: Four times a day (QID) | ORAL | Status: DC | PRN
  Administered 2018-06-16: 4 mg via ORAL
  Filled 2018-06-14: qty 1

## 2018-06-14 MED ORDER — APIXABAN 5 MG PO TABS
5.0000 mg | ORAL_TABLET | Freq: Two times a day (BID) | ORAL | Status: DC
  Administered 2018-06-14 – 2018-06-18 (×8): 5 mg via ORAL
  Filled 2018-06-14 (×8): qty 1

## 2018-06-14 MED ORDER — MONTELUKAST SODIUM 10 MG PO TABS
10.0000 mg | ORAL_TABLET | Freq: Every day | ORAL | Status: DC
  Administered 2018-06-14 – 2018-06-17 (×4): 10 mg via ORAL
  Filled 2018-06-14 (×4): qty 1

## 2018-06-14 MED ORDER — SODIUM CHLORIDE 0.9 % IV SOLN
500.0000 mg | Freq: Once | INTRAVENOUS | Status: AC
  Administered 2018-06-14: 500 mg via INTRAVENOUS
  Filled 2018-06-14: qty 500

## 2018-06-14 MED ORDER — SODIUM CHLORIDE 0.9 % IV SOLN
500.0000 mg | INTRAVENOUS | Status: DC
  Filled 2018-06-14: qty 500

## 2018-06-14 MED ORDER — MOMETASONE FURO-FORMOTEROL FUM 200-5 MCG/ACT IN AERO
2.0000 | INHALATION_SPRAY | Freq: Two times a day (BID) | RESPIRATORY_TRACT | Status: DC
  Administered 2018-06-14 – 2018-06-18 (×8): 2 via RESPIRATORY_TRACT
  Filled 2018-06-14: qty 8.8

## 2018-06-14 NOTE — ED Triage Notes (Signed)
PT arrives with complaints of cough starting Sunday night. Pt reports cough is productive. Pt also reports intermittent chest pain of the right side that pt reports as a sharp pain.

## 2018-06-14 NOTE — Progress Notes (Signed)
Family Meeting Note  Advance Directive:yes  Today a meeting took place with the Patient.  Patient is able to participate.   The following clinical team members were present during this meeting:MD  The following were discussed:Patient's diagnosis: multifocal pneumonia, Patient's progosis: Unable to determine and Goals for treatment: Full Code  Additional follow-up to be provided: prn  Time spent during discussion:20 minutes  Evette Doffing, MD

## 2018-06-14 NOTE — ED Notes (Signed)
ED TO INPATIENT HANDOFF REPORT  ED Nurse Name and Phone #: Belia Febo RN [phone]  S Name/Age/Gender Patricia Walker 72 y.o. female Room/Bed: ED15A/ED15A  Code Status   Code Status: Prior  Home/SNF/Other Home Patient oriented to: self alert and oriented x 4  Is this baseline? Yes   Triage Complete: Triage complete  Chief Complaint cough  Triage Note Cough, fever aching and vomitng for 5 days now.  PT arrives with complaints of cough starting Sunday night. Pt reports cough is productive. Pt also reports intermittent chest pain of the right side that pt reports as a sharp pain.    Allergies Allergies  Allergen Reactions  . Codeine Itching  . Other Itching and Other (See Comments)    States antibiotic in the past caused itching but can not remember name    Level of Care/Admitting Diagnosis ED Disposition    ED Disposition Condition [place]: [place] [[id number]]  Level of Care: Med-Surg [16]  Diagnosis: Multifocal pneumonia [[id number]]  Admitting Physician: Hyman Bible DODD [[id number]]  Attending Physician: Hyman Bible DODD [[id number]]  PT Class (Do Not Modify): Observation [104]  PT Acc Code (Do Not Modify): Observation [10022]       B Medical/Surgery History Past Medical History:  Diagnosis Date  . Coronary artery disease    mild, nonobstructive  . Dysrhythmia    Atrial Fibrillation  . GERD (gastroesophageal reflux disease)   . Hyperlipidemia   . Hypertension   . Hypothyroidism   . 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 Morbid obesity ([place])   . Persistent atrial fibrillation   . Psoriasis   . Sleep apnea    compliant with CPAP  . Thyroid disease    Past Surgical History:  Procedure Laterality Date  . ARTERY BIOPSY Right 07/21/2017   Procedure: BIOPSY TEMPORAL ARTERY;  Surgeon: [name], MD;  Location: [facility] ORS;  Service: Vascular;  Laterality: Right;  . CARDIAC CATHETERIZATION    . CARDIOVERSION Right  09/01/2016   Procedure: Cardioversion;  Surgeon: [name], MD;  Location: [facility] ORS;  Service: Cardiovascular;  Laterality: Right;  . CARDIOVERSION N/A 09/09/2016   Procedure: Cardioversion;  Surgeon: [name], MD;  Location: [facility] ORS;  Service: Cardiovascular;  Laterality: N/A;  . ELECTROPHYSIOLOGIC STUDY N/A 02/01/2016   Procedure: CARDIOVERSION;  Surgeon: [name], MD;  Location: [facility] ORS;  Service: Cardiovascular;  Laterality: N/A;  . ESOPHAGEAL DILATION    . ESOPHAGOGASTRODUODENOSCOPY (EGD) WITH PROPOFOL N/A 02/06/2017   Procedure: ESOPHAGOGASTRODUODENOSCOPY (EGD) WITH PROPOFOL;  Surgeon: [name], MD;  Location: [facility] ENDOSCOPY;  Service: Gastroenterology;  Laterality: N/A;  . EUS N/A 02/16/2017   Procedure: FULL UPPER ENDOSCOPIC ULTRASOUND (EUS) RADIAL;  Surgeon: [name], MD;  Location: [facility] ENDOSCOPY;  Service: Gastroenterology;  Laterality: N/A;  . LEFT HEART CATH AND CORONARY ANGIOGRAPHY N/A 09/08/2016   Procedure: Left Heart Cath and Coronary Angiography;  Surgeon: [name], MD;  Location: [facility] CV LAB;  Service: Cardiovascular;  Laterality: N/A;  . US ECHOCARDIOGRAPHY       A IV Location/Drains/Wounds Patient Lines/Drains/Airways Status   Active Line/Drains/Airways    Name:   Placement date:   Placement time:   Site:   Days:   Peripheral IV 06/14/18 Right Antecubital   06/14/18    1746    Antecubital   less than 1   Peripheral IV 06/14/18 Left Hand   06/14/18  1747    Hand   less than 1   Incision (Closed) 07/21/17 Head Right   07/21/17    1202     328          Intake/Output Last 24 hours No intake or output data in the 24 hours ending 06/14/18 2132  Labs/Imaging Results for orders placed or performed during the hospital encounter of 06/14/18 (from the past 48 hour(s))  Basic metabolic panel     Status: Abnormal   Collection Time: 06/14/18  3:02 PM  Result Value Ref Range   Sodium 129 (L) 135 - 145 mmol/L   Potassium 3.8 3.5  - 5.1 mmol/L   Chloride 102 98 - 111 mmol/L   CO2 19 (L) 22 - 32 mmol/L   Glucose, Bld 153 (H) 70 - 99 mg/dL   BUN 19 8 - 23 mg/dL   Creatinine, Ser 1.04 (H) 0.44 - 1.00 mg/dL   Calcium 8.2 (L) 8.9 - 10.3 mg/dL   GFR calc non Af Amer 54 (L) >60 mL/min   GFR calc Af Amer >60 >60 mL/min   Anion gap 8 5 - 15    Comment: Performed at [facility], [address] [place], [place] [address]  CBC     Status: Abnormal   Collection Time: 06/14/18  3:02 PM  Result Value Ref Range   WBC 11.7 (H) 4.0 - 10.5 K/uL   RBC 5.63 (H) 3.87 - 5.11 MIL/uL   Hemoglobin 15.5 (H) 12.0 - 15.0 g/dL   HCT 45.8 36.0 - 46.0 %   MCV 81.3 80.0 - 100.0 fL   MCH 27.5 26.0 - 34.0 pg   MCHC 33.8 30.0 - 36.0 g/dL   RDW 13.3 11.5 - 15.5 %   Platelets 319 150 - 400 K/uL   nRBC 0.0 0.0 - 0.2 %    Comment: Performed at [facility], [address] [place], [place] [address]  Troponin I - ONCE - STAT     Status: None   Collection Time: 06/14/18  3:02 PM  Result Value Ref Range   Troponin I <0.03 <0.03 ng/mL    Comment: Performed at [facility], [address] [place], [place] [address]  Influenza panel by PCR (type A & B)     Status: None   Collection Time: 06/14/18  3:08 PM  Result Value Ref Range   Influenza A By PCR NEGATIVE NEGATIVE   Influenza B By PCR NEGATIVE NEGATIVE    Comment: (NOTE) The Xpert Xpress Flu assay is intended as an aid in the diagnosis of  influenza and should not be used as a sole basis for treatment.  This  assay is FDA approved for nasopharyngeal swab specimens only. Nasal  washings and aspirates are unacceptable for Xpert Xpress Flu testing. Performed at [facility], [address] [place], [place] [address]    Dg Chest 2 View  Result Date: 06/14/2018 CLINICAL DATA:  Pt arrives with complaints of cough starting x 5 days ago. Pt reports cough is productive. Pt also reports intermittent chest pain of the right side that pt reports as a sharp  pain. Patient also reports fever and vomiting. Hx of MI, HTN, CAD. Former smoker- quit in 2014. EXAM: CHEST - 2 VIEW COMPARISON:  03/27/2018 FINDINGS: Cardiac silhouette is top-normal in size. No mediastinal or hilar masses. No convincing adenopathy. There are chronically prominent bronchovascular markings bilaterally. There is no evidence of pneumonia or pulmonary edema. No pleural effusion or  pneumothorax. Skeletal structures are intact. IMPRESSION: No acute cardiopulmonary disease. Electronically Signed   By: Lajean Manes M.D.   On: 06/14/2018 15:39   Ct Chest Wo Contrast  Result Date: 06/14/2018 CLINICAL DATA:  Cough starting 5 days ago, productive. Intermittent RIGHT-sided chest pain. Former smoker. EXAM: CT CHEST WITHOUT CONTRAST TECHNIQUE: Multidetector CT imaging of the chest was performed following the standard protocol without IV contrast. COMPARISON:  None. FINDINGS: Cardiovascular: Heart size is within normal limits. Scattered coronary artery calcifications. Aortic atherosclerosis. No thoracic aortic aneurysm. Mediastinum/Nodes: No enlarged lymph nodes seen. Esophagus appears normal. Trachea appears normal. Lungs/Pleura: Patchy consolidations, most prominent within the medial aspects of the LEFT lower lobe, with some associated ground-glass opacities. No pleural effusion. Mild emphysematous change in lung apices. Upper Abdomen: No acute findings within the upper abdomen. Musculoskeletal: No acute or suspicious osseous finding. Mild degenerative spondylosis within the thoracic and upper lumbar spine. IMPRESSION: 1. Patchy consolidations, most prominent within the medial aspects of the left lower lobe, with some associated ground-glass opacities. Findings are compatible with multifocal pneumonia. The associated ground-glass opacities suggest atypical pneumonia such as fungal or viral. In the appropriate clinical setting, would consider COVID-19 infection and would clinically exclude. 2. Coronary artery  calcifications, particularly dense within the left anterior descending coronary artery. Recommend correlation with any possible associated cardiac symptoms. Heart size is normal. Aortic Atherosclerosis (ICD10-I70.0). These results were called by telephone at the time of interpretation on 06/14/2018 at 6:25 pm to Dr. Lavonia Drafts , who verbally acknowledged these results. Electronically Signed   By: Franki Cabot M.D.   On: 06/14/2018 18:23    Pending Labs Unresulted Labs (From admission, onward)    Start     Ordered   06/14/18 2109  Procalcitonin - Baseline  Add-on,   AD     06/14/18 2108   06/14/18 1829  Blood culture (routine x 2)  BLOOD CULTURE X 2,   STAT     06/14/18 1828   Signed and Held  Basic metabolic panel  Tomorrow morning,   R     Signed and Held   Signed and Held  CBC  Tomorrow morning,   R     Signed and Held          Vitals/Pain Today's Vitals   06/14/18 2000 06/14/18 2030 06/14/18 2100 06/14/18 2130  BP:    115/66  Pulse: 100 97 85 95  Resp:    18  Temp:    100.3 F (37.9 C)  TempSrc:    Oral  SpO2: 93% 94% 94% 92%  Weight:      Height:      PainSc:        Isolation Precautions Droplet precaution  Medications Medications  ipratropium-albuterol (DUONEB) 0.5-2.5 (3) MG/3ML nebulizer solution 3 mL (3 mLs Nebulization Given 06/14/18 1812)  ipratropium-albuterol (DUONEB) 0.5-2.5 (3) MG/3ML nebulizer solution 3 mL (3 mLs Nebulization Given 06/14/18 1812)  cefTRIAXone (ROCEPHIN) 1 g in sodium chloride 0.9 % 100 mL IVPB (0 g Intravenous Stopped 06/14/18 2131)  azithromycin (ZITHROMAX) 500 mg in sodium chloride 0.9 % 250 mL IVPB (500 mg Intravenous New Bag/Given 06/14/18 1849)    Mobility walks Low fall risk   Focused Assessments Pulmonary Assessment Handoff:  Lung sounds: Bilateral Breath Sounds: Expiratory wheezes O2 Device: Room Air        R Recommendations: See Admitting Provider Note  Report given to:   Additional Notes:

## 2018-06-14 NOTE — ED Triage Notes (Signed)
Cough, fever aching and vomitng for 5 days now.

## 2018-06-14 NOTE — ED Notes (Signed)
Patient transported to CT 

## 2018-06-14 NOTE — ED Provider Notes (Signed)
[facility] Emergency Department Provider Note   ____________________________________________    I have reviewed the triage vital signs and the nursing notes.   HISTORY  Chief Complaint Cough; Chest Pain; and Headache     HPI Patricia Walker is a 73 y.o. female who presents with primary complaints of cough, mild chest tightness, diffuse weakness, chills, possible fevers x3 days.  She reports she has so little energy that is difficult for her to get up.  She reports productive cough.  No travel.  no calf pain or swelling.  Mild nausea.  No myalgias.  Has not take anything for this.   Past Medical History:  Diagnosis Date  . Coronary artery disease    mild, nonobstructive  . Dysrhythmia    Atrial Fibrillation  . GERD (gastroesophageal reflux disease)   . Hyperlipidemia   . Hypertension   . Hypothyroidism   . 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 Morbid obesity ([place])   . Persistent atrial fibrillation   . Psoriasis   . Sleep apnea    compliant with CPAP  . Thyroid disease     Patient Active Problem List   Diagnosis Date Noted  . Unstable angina ([place]) 06/13/2017  . New onset of headaches 06/12/2017  . Photophobia of right eye 06/12/2017  . Osteoarthritis of knee 11/17/2016  . Coronary artery disease 09/16/2016  . History of cardioversion 09/16/2016  . Hyperlipidemia, unspecified 09/16/2016  . Hypertension 09/16/2016  . Obesity, unspecified 09/16/2016  . Atrial fibrillation ([place]) 09/08/2016  . Cardiac arrhythmia 09/09/2015  . Gastroesophageal reflux disease 09/09/2015  . Obstructive sleep apnea syndrome 09/09/2015  . Psoriasis 09/09/2015    Past Surgical History:  Procedure Laterality Date  . ARTERY BIOPSY Right 07/21/2017   Procedure: BIOPSY TEMPORAL ARTERY;  Surgeon: [name], MD;  Location: [facility] ORS;  Service: Vascular;  Laterality: Right;  . CARDIAC CATHETERIZATION    . CARDIOVERSION Right 09/01/2016   Procedure:  Cardioversion;  Surgeon: [name], MD;  Location: [facility] ORS;  Service: Cardiovascular;  Laterality: Right;  . CARDIOVERSION N/A 09/09/2016   Procedure: Cardioversion;  Surgeon: [name], MD;  Location: [facility] ORS;  Service: Cardiovascular;  Laterality: N/A;  . ELECTROPHYSIOLOGIC STUDY N/A 02/01/2016   Procedure: CARDIOVERSION;  Surgeon: [name], MD;  Location: [facility] ORS;  Service: Cardiovascular;  Laterality: N/A;  . ESOPHAGEAL DILATION    . ESOPHAGOGASTRODUODENOSCOPY (EGD) WITH PROPOFOL N/A 02/06/2017   Procedure: ESOPHAGOGASTRODUODENOSCOPY (EGD) WITH PROPOFOL;  Surgeon: [name], MD;  Location: [facility] ENDOSCOPY;  Service: Gastroenterology;  Laterality: N/A;  . EUS N/A 02/16/2017   Procedure: FULL UPPER ENDOSCOPIC ULTRASOUND (EUS) RADIAL;  Surgeon: [name], MD;  Location: [facility] ENDOSCOPY;  Service: Gastroenterology;  Laterality: N/A;  . LEFT HEART CATH AND CORONARY ANGIOGRAPHY N/A 09/08/2016   Procedure: Left Heart Cath and Coronary Angiography;  Surgeon: [name], MD;  Location: [place] [facility] LAB;  Service: Cardiovascular;  Laterality: N/A;  . US ECHOCARDIOGRAPHY      Prior to Admission medications   Medication Sig Start Date End Date Taking? Authorizing Provider  albuterol (PROVENTIL HFA;VENTOLIN HFA) 108 (90 Base) MCG/ACT inhaler Inhale 2 puffs into the lungs every 6 (six) hours as needed for wheezing or shortness of breath.    [provider]  apixaban (ELIQUIS) 5 MG TABS tablet Take 5 mg by mouth 2 (two) times daily.    [provider]  atorvastatin (LIPITOR) 20 MG tablet  Take 20 mg by mouth every evening. 08/25/16   [provider]  azelastine (ASTELIN) 0.1 % nasal spray Place 2 sprays into both nostrils 2 (two) times daily as needed for rhinitis or allergies.  10/13/16   [provider]  baclofen (LIORESAL) 10 MG tablet Take 10 mg by mouth 2 (two) times daily. 06/13/17   [provider]  budesonide-formoterol  (SYMBICORT) 160-4.5 MCG/ACT inhaler Inhale 2 puffs into the lungs 2 (two) times daily as needed (for shortness of breath).     [provider]  butalbital-acetaminophen-caffeine (FIORICET WITH CODEINE) 50-325-40-30 MG capsule Take 1 capsule by mouth every 6 (six) hours as needed for migraine. 06/13/17   [provider]  calcium carbonate (TUMS - DOSED IN MG ELEMENTAL CALCIUM) 500 MG chewable tablet Chew 0.5 tablets by mouth 3 (three) times daily as needed for indigestion or heartburn.     [provider]  cetirizine (ZYRTEC) 10 MG tablet Take 10 mg by mouth daily as needed for allergies.    [provider]  Cholecalciferol (VITAMIN D3) 50000 units CAPS Take 50,000 Units by mouth every Monday.  08/24/16   [provider]  esomeprazole (NEXIUM) 40 MG capsule Take 40 mg by mouth daily. 07/01/17   [provider]  folic acid (FOLVITE) 1 MG tablet Take 3 mg by mouth See admin instructions. Takes 3 tablets (3mg ) daily except for Mondays.    [provider]  furosemide (LASIX) 20 MG tablet Take 20 mg by mouth daily as needed for fluid. 08/24/16   [provider]  hydrochlorothiazide (HYDRODIURIL) 25 MG tablet Take by mouth.    [provider]  hydrOXYzine (ATARAX/VISTARIL) 25 MG tablet Take by mouth.    [provider]  ipratropium (ATROVENT) 0.06 % nasal spray Place 2 sprays into both nostrils 2 (two) times daily as needed for rhinitis.     [provider]  levothyroxine (SYNTHROID, LEVOTHROID) 50 MCG tablet Take 50 mcg by mouth daily before breakfast.    [provider]  methotrexate (RHEUMATREX) 2.5 MG tablet Take 15 mg by mouth every Monday.  05/23/17   [provider]  montelukast (SINGULAIR) 10 MG tablet Take 10 mg by mouth daily. 05/26/17   [provider]  neomycin-polymyxin-hydrocortisone (CORTISPORIN) otic solution Place 4 drops into the right ear 4 (four) times daily as needed  (psoriasis in ear).    [provider]  nitroGLYCERIN (NITROSTAT) 0.4 MG SL tablet Place 0.4 mg under the tongue every 5 (five) minutes as needed for chest pain. 04/27/17   [provider]  nystatin (NYSTATIN) powder Apply 1 g topically 3 (three) times daily as needed. Irritation under breast and groin area     [provider]  ondansetron (ZOFRAN ODT) 4 MG disintegrating tablet Take 1 tablet (4 mg total) by mouth every 8 (eight) hours as needed for nausea or vomiting. 12/25/17   Carrie Mew, MD  ondansetron (ZOFRAN) 4 MG tablet Take 4 mg by mouth every 6 (six) hours as needed for nausea or vomiting.    [provider]  ranitidine (ZANTAC) 150 MG tablet Take 150 mg by mouth 2 (two) times daily. 04/27/17   [provider]  traMADol (ULTRAM) 50 MG tablet Take 1 tablet (50 mg total) by mouth every 6 (six) hours as needed. 03/27/18 03/27/19  Harvest Dark, MD     Allergies Codeine and Other  Family History  Problem Relation Age of Onset  . Breast cancer Paternal Aunt   .  Other Father   . Heart attack Father     Social History Social History   Tobacco Use  . Smoking status: Former Smoker    Types: Cigarettes    Last attempt to quit: 02/01/2013    Years since quitting: 5.3  . Smokeless tobacco: Never Used  Substance Use Topics  . Alcohol use: No  . Drug use: No    Review of Systems  Constitutional: As above Eyes: No visual changes.  ENT: No sore throat. Cardiovascular: Some chest tightness Respiratory: As above. Gastrointestinal: No abdominal pain.  No nausea, no vomiting.   Genitourinary: Negative for dysuria. Musculoskeletal: Negative for back pain. Skin: Negative for rash. Neurological: Negative for headaches    ____________________________________________   PHYSICAL EXAM:  VITAL SIGNS: ED Triage Vitals  Enc Vitals Group     BP 06/14/18 1452 120/82     Pulse Rate 06/14/18 1452 80     Resp 06/14/18 1452 20      Temp 06/14/18 1452 99.5 F (37.5 C)     Temp Source 06/14/18 1452 Oral     SpO2 06/14/18 1452 95 %     Weight 06/14/18 1453 122.5 kg (270 lb)     Height 06/14/18 1453 1.638 m (5' 4.5")     Head Circumference --      Peak Flow --      Pain Score 06/14/18 1451 7     Pain Loc --      Pain Edu? --      Excl. in [place]? --     Constitutional: Alert and oriented.  Nose: No congestion/rhinnorhea. Mouth/Throat: Mucous membranes are moist.   Neck:  Painless ROM Cardiovascular: Normal rate, regular rhythm. Grossly normal heart sounds.  Good peripheral circulation. Respiratory: Normal respiratory effort.  No retractions.  Scattered wheezes, bibasilar [place] Gastrointestinal: Soft and nontender. No distention.   Musculoskeletal:   Warm and well perfused Neurologic:  Normal speech and language. No gross focal neurologic deficits are appreciated.  Skin:  Skin is warm, dry and intact. No rash noted. Psychiatric: Mood and affect are normal. Speech and behavior are normal.  ____________________________________________   LABS (all labs ordered are listed, but only abnormal results are displayed)  Labs Reviewed  BASIC METABOLIC PANEL - Abnormal; Notable for the following components:      Result Value   Sodium 129 (*)    CO2 19 (*)    Glucose, Bld 153 (*)    Creatinine, Ser 1.04 (*)    Calcium 8.2 (*)    GFR calc non Af Amer 54 (*)    All other components within normal limits  CBC - Abnormal; Notable for the following components:   WBC 11.7 (*)    RBC 5.63 (*)    Hemoglobin 15.5 (*)    All other components within normal limits  CULTURE, BLOOD (ROUTINE X 2)  CULTURE, BLOOD (ROUTINE X 2)  TROPONIN I  INFLUENZA PANEL BY PCR (TYPE A & B)   ____________________________________________  EKG  ED ECG REPORT I, Lavonia Drafts, the attending physician, personally viewed and interpreted this ECG.  Date: 06/14/2018  Rhythm: Atrial fibrillation QRS Axis: normal Intervals: Bundle branch  block ST/T Wave abnormalities: Nonspecific changes Narrative Interpretation: no evidence of acute ischemia  ____________________________________________  RADIOLOGY  Chest x-ray unremarkable CT chest demonstrates multi lobar pneumonia ____________________________________________   PROCEDURES  Procedure(s) performed: No  Procedures   Critical Care performed: yes  CRITICAL CARE Performed by: Lavonia Drafts   Total critical care time:30  minutes  Critical care time was exclusive of separately billable procedures and treating other patients.  Critical care was necessary to treat or prevent imminent or life-threatening deterioration.  Critical care was time spent personally by me on the following activities: development of treatment plan with patient and/or surrogate as well as nursing, discussions with consultants, evaluation of patient's response to treatment, examination of patient, obtaining history from patient or surrogate, ordering and performing treatments and interventions, ordering and review of laboratory studies, ordering and review of radiographic studies, pulse oximetry and re-evaluation of patient's condition.  ____________________________________________   INITIAL IMPRESSION / ASSESSMENT AND PLAN / ED COURSE  Pertinent labs & imaging results that were available during my care of the patient were reviewed by me and considered in my medical decision making (see chart for details).  Patient presents with diffuse weakness, cough, mild shortness of breath.  Mild elevation in white blood cell count, chest x-ray is overall reassuring, influenza is negative.  Suspicious for pneumonia however we will send for CT Noncon  CT chest demonstrates multi lobar pneumonia, contacted by radiologist and informed of groundglass appearance that has been seen with Covid 19 pandemic however the patient has not traveled anywhere, has had no exposure to anyone who is travel to [place] state or [place] or [place] so I gauge her to be extremely low risk for novel coronavirus infection.  We will treat her with IV Rocephin IV azithromycin and admit to the hospital service    ____________________________________________   FINAL CLINICAL IMPRESSION(S) / ED DIAGNOSES  Final diagnoses:  Community acquired pneumonia, unspecified laterality        Note:  This document was prepared using Dragon voice recognition software and [place] include unintentional dictation errors.   Lavonia Drafts, MD 06/14/18 [phone]

## 2018-06-14 NOTE — ED Notes (Signed)
Kala RN, aware of bed assigned  

## 2018-06-14 NOTE — H&P (Addendum)
[place] [facility] [place] NAME: Patricia Walker    MR#:  [id number]  DATE OF BIRTH:  Feb 28, 1946  DATE OF ADMISSION:  06/14/2018  PRIMARY CARE PHYSICIAN: Perrin Maltese, MD   REQUESTING/REFERRING PHYSICIAN: Lavonia Drafts, MD  CHIEF COMPLAINT:   Chief Complaint  Patient presents with  . Cough  . Chest Pain  . Headache    HISTORY OF PRESENT ILLNESS:  Patricia Walker  is a 73 y.o. female with a known history of chronic atrial fibrillation, hypertension, hyperlipidemia, hypothyroidism, CAD, OSA who presented to the ED with cough, fevers, chills, and shortness of breath over the last 4 days.  Her symptoms have been getting progressively worse.  She feels very weak and tired.  Her shortness of breath is occurring on ambulation and at rest.  She denies any chest pain or abdominal pain.  In the ED, vitals were unremarkable.  Labs are significant for WBC 11.7.  Chest x-ray was negative.  CT chest showed patchy consolidations, consistent with multifocal pneumonia.  She was started on ceftriaxone and azithromycin.  Hospitalists were called for admission.  PAST MEDICAL HISTORY:   Past Medical History:  Diagnosis Date  . Coronary artery disease    mild, nonobstructive  . Dysrhythmia    Atrial Fibrillation  . GERD (gastroesophageal reflux disease)   . Hyperlipidemia   . Hypertension   . Hypothyroidism   . 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 Morbid obesity ([place])   . Persistent atrial fibrillation   . Psoriasis   . Sleep apnea    compliant with CPAP  . Thyroid disease     PAST SURGICAL HISTORY:   Past Surgical History:  Procedure Laterality Date  . ARTERY BIOPSY Right 07/21/2017   Procedure: BIOPSY TEMPORAL ARTERY;  Surgeon: [name], MD;  Location: [facility] ORS;  Service: Vascular;  Laterality: Right;  . CARDIAC CATHETERIZATION    . CARDIOVERSION Right 09/01/2016   Procedure: Cardioversion;  Surgeon: [name], MD;  Location: [facility]  ORS;  Service: Cardiovascular;  Laterality: Right;  . CARDIOVERSION N/A 09/09/2016   Procedure: Cardioversion;  Surgeon: [name], MD;  Location: [facility] ORS;  Service: Cardiovascular;  Laterality: N/A;  . ELECTROPHYSIOLOGIC STUDY N/A 02/01/2016   Procedure: CARDIOVERSION;  Surgeon: [name], MD;  Location: [facility] ORS;  Service: Cardiovascular;  Laterality: N/A;  . ESOPHAGEAL DILATION    . ESOPHAGOGASTRODUODENOSCOPY (EGD) WITH PROPOFOL N/A 02/06/2017   Procedure: ESOPHAGOGASTRODUODENOSCOPY (EGD) WITH PROPOFOL;  Surgeon: [name], MD;  Location: [facility] ENDOSCOPY;  Service: Gastroenterology;  Laterality: N/A;  . EUS N/A 02/16/2017   Procedure: FULL UPPER ENDOSCOPIC ULTRASOUND (EUS) RADIAL;  Surgeon: [name], MD;  Location: [facility] ENDOSCOPY;  Service: Gastroenterology;  Laterality: N/A;  . LEFT HEART CATH AND CORONARY ANGIOGRAPHY N/A 09/08/2016   Procedure: Left Heart Cath and Coronary Angiography;  Surgeon: [name], MD;  Location: [facility] CV LAB;  Service: Cardiovascular;  Laterality: N/A;  . US ECHOCARDIOGRAPHY      SOCIAL HISTORY:   Social History   Tobacco Use  . Smoking status: Former Smoker    Types: Cigarettes    Last attempt to quit: 02/01/2013    Years since quitting: 5.3  . Smokeless tobacco: Never Used  Substance Use Topics  . Alcohol use: No    FAMILY HISTORY:   Family History  Problem Relation Age of Onset  . Breast cancer Paternal Aunt   . Other  Father   . Heart attack Father     DRUG ALLERGIES:   Allergies  Allergen Reactions  . Codeine Itching  . Other Itching and Other (See Comments)    States antibiotic in the past caused itching but can not remember name    REVIEW OF SYSTEMS:   Review of Systems  Constitutional: Positive for chills, fever and malaise/fatigue.  HENT: Negative for congestion.   Eyes: Negative for blurred vision and double vision.  Respiratory: Positive for cough and shortness of breath.   Cardiovascular:  Negative for chest pain and palpitations.  Gastrointestinal: Negative for nausea and vomiting.  Genitourinary: Negative for dysuria and urgency.  Musculoskeletal: Negative for back pain and neck pain.  Neurological: Negative for dizziness and headaches.  Psychiatric/Behavioral: Negative for depression. The patient is not nervous/anxious.     MEDICATIONS AT HOME:   Prior to Admission medications   Medication Sig Start Date End Date Taking? Authorizing Provider  albuterol (PROVENTIL HFA;VENTOLIN HFA) 108 (90 Base) MCG/ACT inhaler Inhale 2 puffs into the lungs every 6 (six) hours as needed for wheezing or shortness of breath.   Yes [provider]  apixaban (ELIQUIS) 5 MG TABS tablet Take 5 mg by mouth 2 (two) times daily.   Yes [provider]  atorvastatin (LIPITOR) 20 MG tablet Take 20 mg by mouth every evening. 08/25/16  Yes [provider]  azelastine (ASTELIN) 0.1 % nasal spray Place 2 sprays into both nostrils 2 (two) times daily as needed for rhinitis or allergies.  10/13/16  Yes [provider]  baclofen (LIORESAL) 10 MG tablet Take 10 mg by mouth 2 (two) times daily. 06/13/17  Yes [provider]  budesonide-formoterol (SYMBICORT) 160-4.5 MCG/ACT inhaler Inhale 2 puffs into the lungs 2 (two) times daily.    Yes [provider]  doxepin (SINEQUAN) 25 MG capsule Take 25 mg by mouth at bedtime.   Yes [provider]  esomeprazole (NEXIUM) 40 MG capsule Take 40 mg by mouth daily. 07/01/17  Yes [provider]  furosemide (LASIX) 20 MG tablet Take 20 mg by mouth daily as needed for fluid. 08/24/16  Yes [provider]  gabapentin (NEURONTIN) 300 MG capsule Take 300 mg by mouth 2 (two) times daily.   Yes [provider]  levothyroxine (SYNTHROID, LEVOTHROID) 50 MCG tablet Take 50 mcg by mouth daily before breakfast.   Yes [provider]  montelukast (SINGULAIR) 10 MG tablet Take 10 mg by mouth at  bedtime.  05/26/17  Yes [provider]  pregabalin (LYRICA) 50 MG capsule Take 50 mg by mouth daily as needed for pain.   Yes [provider]  ranitidine (ZANTAC) 150 MG tablet Take 150 mg by mouth 2 (two) times daily. 04/27/17  Yes [provider]  SKYRIZI, 150 MG DOSE, 75 MG/0.83ML PSKT Inject 1 Dose into the skin every 3 (three) months. 05/09/18  Yes [provider]  triamcinolone ointment (KENALOG) 0.1 % Apply 1 application topically as directed. 05/09/18  Yes [provider]  ondansetron (ZOFRAN ODT) 4 MG disintegrating tablet Take 1 tablet (4 mg total) by mouth every 8 (eight) hours as needed for nausea or vomiting. Patient not taking: Reported on 06/14/2018 12/25/17   Carrie Mew, MD  traMADol (ULTRAM) 50 MG tablet Take 1 tablet (50 mg total) by mouth every 6 (six) hours as needed. Patient not taking: Reported on 06/14/2018 03/27/18 03/27/19  Harvest Dark, MD      VITAL SIGNS:  Blood pressure [name] Kitchen)  119/91, pulse 93, temperature 99.5 F (37.5 C), temperature source Oral, resp. rate 20, height 5' 4.5" (1.638 m), weight 122.5 kg, SpO2 95 %.  PHYSICAL EXAMINATION:  Physical Exam  GENERAL:  73 y.o.-year-old patient lying in the bed with no acute distress.  EYES: Pupils equal, round, reactive to light and accommodation. No scleral icterus. Extraocular muscles intact.  HEENT: Head atraumatic, normocephalic. Oropharynx and nasopharynx clear.  NECK:  Supple, no jugular venous distention. No thyroid enlargement, no tenderness.  LUNGS: Coarse breath sounds throughout all lung fields.  +Diffuse wheezing.  No crackles.  No use of accessory muscles of respiration.  CARDIOVASCULAR: Irregularly irregular rhythm, regular rate, S1, S2 normal. No murmurs, rubs, or gallops.  ABDOMEN: Soft, nontender, nondistended. Bowel sounds present. No organomegaly or mass.  EXTREMITIES: No pedal edema, cyanosis, or clubbing.  NEUROLOGIC: Cranial nerves II through XII  are intact. + Global weakness. Sensation intact. Gait not checked.  PSYCHIATRIC: The patient is alert and oriented x 3.  SKIN: No obvious rash, lesion, or ulcer.   LABORATORY PANEL:   CBC Recent Labs  Lab 06/14/18 1502  WBC 11.7*  HGB 15.5*  HCT 45.8  PLT 319   ------------------------------------------------------------------------------------------------------------------  Chemistries  Recent Labs  Lab 06/14/18 1502  NA 129*  K 3.8  CL 102  CO2 19*  GLUCOSE 153*  BUN 19  CREATININE 1.04*  CALCIUM 8.2*   ------------------------------------------------------------------------------------------------------------------  Cardiac Enzymes Recent Labs  Lab 06/14/18 1502  TROPONINI <0.03   ------------------------------------------------------------------------------------------------------------------  RADIOLOGY:  Dg Chest 2 View  Result Date: 06/14/2018 CLINICAL DATA:  Pt arrives with complaints of cough starting x 5 days ago. Pt reports cough is productive. Pt also reports intermittent chest pain of the right side that pt reports as a sharp pain. Patient also reports fever and vomiting. Hx of MI, HTN, CAD. Former smoker- quit in 2014. EXAM: CHEST - 2 VIEW COMPARISON:  03/27/2018 FINDINGS: Cardiac silhouette is top-normal in size. No mediastinal or hilar masses. No convincing adenopathy. There are chronically prominent bronchovascular markings bilaterally. There is no evidence of pneumonia or pulmonary edema. No pleural effusion or pneumothorax. Skeletal structures are intact. IMPRESSION: No acute cardiopulmonary disease. Electronically Signed   By: Lajean Manes M.D.   On: 06/14/2018 15:39   Ct Chest Wo Contrast  Result Date: 06/14/2018 CLINICAL DATA:  Cough starting 5 days ago, productive. Intermittent RIGHT-sided chest pain. Former smoker. EXAM: CT CHEST WITHOUT CONTRAST TECHNIQUE: Multidetector CT imaging of the chest was performed following the standard protocol without  IV contrast. COMPARISON:  None. FINDINGS: Cardiovascular: Heart size is within normal limits. Scattered coronary artery calcifications. Aortic atherosclerosis. No thoracic aortic aneurysm. Mediastinum/Nodes: No enlarged lymph nodes seen. Esophagus appears normal. Trachea appears normal. Lungs/Pleura: Patchy consolidations, most prominent within the medial aspects of the LEFT lower lobe, with some associated ground-glass opacities. No pleural effusion. Mild emphysematous change in lung apices. Upper Abdomen: No acute findings within the upper abdomen. Musculoskeletal: No acute or suspicious osseous finding. Mild degenerative spondylosis within the thoracic and upper lumbar spine. IMPRESSION: 1. Patchy consolidations, most prominent within the medial aspects of the left lower lobe, with some associated ground-glass opacities. Findings are compatible with multifocal pneumonia. The associated ground-glass opacities suggest atypical pneumonia such as fungal or viral. In the appropriate clinical setting, would consider COVID-19 infection and would clinically exclude. 2. Coronary artery calcifications, particularly dense within the left anterior descending coronary artery. Recommend correlation with any possible associated cardiac symptoms. Heart size is normal. Aortic Atherosclerosis (ICD10-I70.0). These  results were called by telephone at the time of interpretation on 06/14/2018 at 6:25 pm to Dr. Lavonia Drafts , who verbally acknowledged these results. Electronically Signed   By: Franki Cabot M.D.   On: 06/14/2018 18:23      IMPRESSION AND PLAN:   Multifocal pneumonia- no signs of sepsis on admission.  CT chest with findings consistent with possible COVID-19, patient has not traveled or been in contact with anybody who is traveled outside of the state recently. Continue ceftriaxone and azithromycin.  Will give IV Solu-Medrol twice daily. Check procalcitonin. Follow-up blood cultures.  Hyponatremia- likely related  to pneumonia and dehydration. Gentle IVFs. Recheck in the morning.  Chronic atrial fibrillation- rate controlled in the ED.  Continue Eliquis.  Hyperlipidemia-continue Lipitor  Hypothyroidism-continue Synthroid  OSA- CPAP nightly  All the records are reviewed and case discussed with ED provider. Management plans discussed with the patient, family and they are in agreement.  CODE STATUS: Full  TOTAL TIME TAKING CARE OF THIS PATIENT: 45 minutes.    Berna Spare  M.D on 06/14/2018 at 8:39 PM  Between 7am to 6pm - Pager - [phone]  After 6pm go to [email or web address] - Proofreader  [facility] [place] [facility]  Office  [phone]  CC: Primary care physician; Perrin Maltese, MD   Note: This dictation was prepared with Dragon dictation along with smaller phrase technology. Any transcriptional errors that result from this process are unintentional.

## 2018-06-15 DIAGNOSIS — J189 Pneumonia, unspecified organism: Secondary | ICD-10-CM | POA: Diagnosis present

## 2018-06-15 DIAGNOSIS — Z87891 Personal history of nicotine dependence: Secondary | ICD-10-CM | POA: Diagnosis not present

## 2018-06-15 DIAGNOSIS — Z79899 Other long term (current) drug therapy: Secondary | ICD-10-CM | POA: Diagnosis not present

## 2018-06-15 DIAGNOSIS — E785 Hyperlipidemia, unspecified: Secondary | ICD-10-CM | POA: Diagnosis present

## 2018-06-15 DIAGNOSIS — J121 Respiratory syncytial virus pneumonia: Secondary | ICD-10-CM | POA: Diagnosis present

## 2018-06-15 DIAGNOSIS — Z6841 Body Mass Index (BMI) 40.0 and over, adult: Secondary | ICD-10-CM | POA: Diagnosis not present

## 2018-06-15 DIAGNOSIS — G4733 Obstructive sleep apnea (adult) (pediatric): Secondary | ICD-10-CM | POA: Diagnosis present

## 2018-06-15 DIAGNOSIS — Z888 Allergy status to other drugs, medicaments and biological substances status: Secondary | ICD-10-CM | POA: Diagnosis not present

## 2018-06-15 DIAGNOSIS — Z79890 Hormone replacement therapy (postmenopausal): Secondary | ICD-10-CM | POA: Diagnosis not present

## 2018-06-15 DIAGNOSIS — Z803 Family history of malignant neoplasm of breast: Secondary | ICD-10-CM | POA: Diagnosis not present

## 2018-06-15 DIAGNOSIS — J45909 Unspecified asthma, uncomplicated: Secondary | ICD-10-CM | POA: Diagnosis present

## 2018-06-15 DIAGNOSIS — Z7901 Long term (current) use of anticoagulants: Secondary | ICD-10-CM | POA: Diagnosis not present

## 2018-06-15 DIAGNOSIS — Z7951 Long term (current) use of inhaled steroids: Secondary | ICD-10-CM | POA: Diagnosis not present

## 2018-06-15 DIAGNOSIS — I2510 Atherosclerotic heart disease of native coronary artery without angina pectoris: Secondary | ICD-10-CM | POA: Diagnosis present

## 2018-06-15 DIAGNOSIS — J157 Pneumonia due to Mycoplasma pneumoniae: Secondary | ICD-10-CM | POA: Diagnosis present

## 2018-06-15 DIAGNOSIS — E871 Hypo-osmolality and hyponatremia: Secondary | ICD-10-CM | POA: Diagnosis present

## 2018-06-15 DIAGNOSIS — I252 Old myocardial infarction: Secondary | ICD-10-CM | POA: Diagnosis not present

## 2018-06-15 DIAGNOSIS — K219 Gastro-esophageal reflux disease without esophagitis: Secondary | ICD-10-CM | POA: Diagnosis present

## 2018-06-15 DIAGNOSIS — I10 Essential (primary) hypertension: Secondary | ICD-10-CM | POA: Diagnosis present

## 2018-06-15 DIAGNOSIS — E039 Hypothyroidism, unspecified: Secondary | ICD-10-CM | POA: Diagnosis present

## 2018-06-15 DIAGNOSIS — Z8249 Family history of ischemic heart disease and other diseases of the circulatory system: Secondary | ICD-10-CM | POA: Diagnosis not present

## 2018-06-15 DIAGNOSIS — I4819 Other persistent atrial fibrillation: Secondary | ICD-10-CM | POA: Diagnosis present

## 2018-06-15 DIAGNOSIS — Z885 Allergy status to narcotic agent status: Secondary | ICD-10-CM | POA: Diagnosis not present

## 2018-06-15 LAB — RESPIRATORY PANEL BY PCR
Adenovirus: NOT DETECTED
Bordetella pertussis: NOT DETECTED
Chlamydophila pneumoniae: NOT DETECTED
Coronavirus 229E: NOT DETECTED
Coronavirus HKU1: NOT DETECTED
Coronavirus NL63: NOT DETECTED
Coronavirus OC43: NOT DETECTED
Influenza A: NOT DETECTED
Influenza B: NOT DETECTED
Metapneumovirus: NOT DETECTED
Mycoplasma pneumoniae: DETECTED — AB
Parainfluenza Virus 1: NOT DETECTED
Parainfluenza Virus 2: NOT DETECTED
Parainfluenza Virus 3: NOT DETECTED
Parainfluenza Virus 4: NOT DETECTED
RESPIRATORY SYNCYTIAL VIRUS-RVPPCR: DETECTED — AB
RHINOVIRUS / ENTEROVIRUS - RVPPCR: NOT DETECTED

## 2018-06-15 LAB — BASIC METABOLIC PANEL
Anion gap: 10 (ref 5–15)
BUN: 23 mg/dL (ref 8–23)
CO2: 20 mmol/L — ABNORMAL LOW (ref 22–32)
Calcium: 8.4 mg/dL — ABNORMAL LOW (ref 8.9–10.3)
Chloride: 103 mmol/L (ref 98–111)
Creatinine, Ser: 1.27 mg/dL — ABNORMAL HIGH (ref 0.44–1.00)
GFR calc Af Amer: 49 mL/min — ABNORMAL LOW (ref >60)
GFR calc non Af Amer: 42 mL/min — ABNORMAL LOW (ref >60)
Glucose, Bld: 247 mg/dL — ABNORMAL HIGH (ref 70–99)
POTASSIUM: 3.6 mmol/L (ref 3.5–5.1)
Sodium: 133 mmol/L — ABNORMAL LOW (ref 135–145)

## 2018-06-15 LAB — EXPECTORATED SPUTUM ASSESSMENT W GRAM STAIN, RFLX TO RESP C

## 2018-06-15 LAB — CBC
HEMATOCRIT: 45.4 % (ref 36.0–46.0)
HEMOGLOBIN: 14.9 g/dL (ref 12.0–15.0)
MCH: 27.2 pg (ref 26.0–34.0)
MCHC: 32.8 g/dL (ref 30.0–36.0)
MCV: 82.8 fL (ref 80.0–100.0)
Platelets: 265 10*3/uL (ref 150–400)
RBC: 5.48 MIL/uL — ABNORMAL HIGH (ref 3.87–5.11)
RDW: 13.2 % (ref 11.5–15.5)
WBC: 8.6 10*3/uL (ref 4.0–10.5)
nRBC: 0 % (ref 0.0–0.2)

## 2018-06-15 LAB — URINALYSIS, COMPLETE (UACMP) WITH MICROSCOPIC
Bilirubin Urine: NEGATIVE
Glucose, UA: >=500 mg/dL — AB
Ketones, ur: NEGATIVE mg/dL
Nitrite: NEGATIVE
PH: 6 (ref 5.0–8.0)
Protein, ur: 100 mg/dL — AB
Specific Gravity, Urine: 1.023 (ref 1.005–1.030)

## 2018-06-15 LAB — EXPECTORATED SPUTUM ASSESSMENT W REFEX TO RESP CULTURE

## 2018-06-15 MED ORDER — AZITHROMYCIN 500 MG PO TABS
500.0000 mg | ORAL_TABLET | Freq: Every day | ORAL | Status: DC
  Administered 2018-06-15 – 2018-06-17 (×3): 500 mg via ORAL
  Filled 2018-06-15 (×3): qty 1

## 2018-06-15 MED ORDER — SODIUM CHLORIDE 0.9 % IV SOLN
INTRAVENOUS | Status: AC
  Administered 2018-06-15 (×2): via INTRAVENOUS

## 2018-06-15 MED ORDER — PHENOL 1.4 % MT LIQD
2.0000 | OROMUCOSAL | Status: DC | PRN
  Administered 2018-06-15 (×2): 2 via OROMUCOSAL
  Filled 2018-06-15: qty 177

## 2018-06-15 NOTE — Plan of Care (Signed)
  Problem: Spiritual Needs Goal: Ability to function at adequate level Outcome: Progressing   Problem: Health Behavior/Discharge Planning: Goal: Ability to manage health-related needs will improve Outcome: Progressing   Problem: Clinical Measurements: Goal: Ability to maintain clinical measurements within normal limits will improve Outcome: Progressing Goal: Will remain free from infection Outcome: Progressing Goal: Respiratory complications will improve Outcome: Progressing   Problem: Activity: Goal: Risk for activity intolerance will decrease Outcome: Progressing   Problem: Nutrition: Goal: Adequate nutrition will be maintained Outcome: Progressing   Problem: Coping: Goal: Level of anxiety will decrease Outcome: Progressing   Problem: Elimination: Goal: Will not experience complications related to bowel motility Outcome: Progressing   Problem: Pain Managment: Goal: General experience of comfort will improve Outcome: Progressing   Problem: Safety: Goal: Ability to remain free from injury will improve Outcome: Progressing   Problem: Skin Integrity: Goal: Risk for impaired skin integrity will decrease Outcome: Progressing   Problem: Activity: Goal: Ability to tolerate increased activity will improve Outcome: Progressing   Problem: Clinical Measurements: Goal: Ability to maintain a body temperature in the normal range will improve Outcome: Progressing   Problem: Respiratory: Goal: Ability to maintain adequate ventilation will improve Outcome: Progressing Goal: Ability to maintain a clear airway will improve Outcome: Progressing

## 2018-06-15 NOTE — Progress Notes (Signed)
[place] at [place] NAME: Lakrista Boulet    MR#:  [id number]  DATE OF BIRTH:  17-Oct-1945  SUBJECTIVE:  CHIEF COMPLAINT:   Chief Complaint  Patient presents with  . Cough  . Chest Pain  . Headache   Have complaint of fever, cough, headache for last few days.  Her grandson has symptoms for last 1 week. Noted to have pneumonia on CT chest.  On treatment. REVIEW OF SYSTEMS:  CONSTITUTIONAL: Have fever, fatigue or weakness.  EYES: No blurred or double vision.  EARS, NOSE, AND THROAT: No tinnitus or ear pain.  RESPIRATORY: Have cough, shortness of breath, no wheezing or hemoptysis.  CARDIOVASCULAR: No chest pain, orthopnea, edema.  GASTROINTESTINAL: No nausea, vomiting, diarrhea or abdominal pain.  GENITOURINARY: No dysuria, hematuria.  ENDOCRINE: No polyuria, nocturia,  HEMATOLOGY: No anemia, easy bruising or bleeding SKIN: No rash or lesion. MUSCULOSKELETAL: No joint pain or arthritis.   NEUROLOGIC: No tingling, numbness, weakness.  PSYCHIATRY: No anxiety or depression.   ROS  DRUG ALLERGIES:   Allergies  Allergen Reactions  . Codeine Itching  . Other Itching and Other (See Comments)    States antibiotic in the past caused itching but can not remember name    VITALS:  Blood pressure 106/65, pulse 95, temperature 98.3 F (36.8 C), resp. rate 20, height 5' 4.5" (1.638 m), weight 122.5 kg, SpO2 (!) 89 %.  PHYSICAL EXAMINATION:  GENERAL:  73 y.o.-year-old patient lying in the bed with no acute distress.  EYES: Pupils equal, round, reactive to light and accommodation. No scleral icterus. Extraocular muscles intact.  HEENT: Head atraumatic, normocephalic. Oropharynx and nasopharynx clear.  NECK:  Supple, no jugular venous distention. No thyroid enlargement, no tenderness.  LUNGS: Normal breath sounds bilaterally, no wheezing, some crepitation. No use of accessory muscles of respiration.  CARDIOVASCULAR: S1, S2 normal. No murmurs, rubs,  or gallops.  ABDOMEN: Soft, nontender, nondistended. Bowel sounds present. No organomegaly or mass.  EXTREMITIES: No pedal edema, cyanosis, or clubbing.  NEUROLOGIC: Cranial nerves II through XII are intact. Muscle strength 4/5 in all extremities. Sensation intact. Gait not checked.  PSYCHIATRIC: The patient is alert and oriented x 3.  SKIN: No obvious rash, lesion, or ulcer.   Physical Exam LABORATORY PANEL:   CBC Recent Labs  Lab 06/15/18 0511  WBC 8.6  HGB 14.9  HCT 45.4  PLT 265   ------------------------------------------------------------------------------------------------------------------  Chemistries  Recent Labs  Lab 06/15/18 0511  NA 133*  K 3.6  CL 103  CO2 20*  GLUCOSE 247*  BUN 23  CREATININE 1.27*  CALCIUM 8.4*   ------------------------------------------------------------------------------------------------------------------  Cardiac Enzymes Recent Labs  Lab 06/14/18 1502  TROPONINI <0.03   ------------------------------------------------------------------------------------------------------------------  RADIOLOGY:  Dg Chest 2 View  Result Date: 06/14/2018 CLINICAL DATA:  Pt arrives with complaints of cough starting x 5 days ago. Pt reports cough is productive. Pt also reports intermittent chest pain of the right side that pt reports as a sharp pain. Patient also reports fever and vomiting. Hx of MI, HTN, CAD. Former smoker- quit in 2014. EXAM: CHEST - 2 VIEW COMPARISON:  03/27/2018 FINDINGS: Cardiac silhouette is top-normal in size. No mediastinal or hilar masses. No convincing adenopathy. There are chronically prominent bronchovascular markings bilaterally. There is no evidence of pneumonia or pulmonary edema. No pleural effusion or pneumothorax. Skeletal structures are intact. IMPRESSION: No acute cardiopulmonary disease. Electronically Signed   By: Lajean Manes M.D.   On: 06/14/2018 15:39   Ct  Chest Wo Contrast  Result Date: 06/14/2018 CLINICAL  DATA:  Cough starting 5 days ago, productive. Intermittent RIGHT-sided chest pain. Former smoker. EXAM: CT CHEST WITHOUT CONTRAST TECHNIQUE: Multidetector CT imaging of the chest was performed following the standard protocol without IV contrast. COMPARISON:  None. FINDINGS: Cardiovascular: Heart size is within normal limits. Scattered coronary artery calcifications. Aortic atherosclerosis. No thoracic aortic aneurysm. Mediastinum/Nodes: No enlarged lymph nodes seen. Esophagus appears normal. Trachea appears normal. Lungs/Pleura: Patchy consolidations, most prominent within the medial aspects of the LEFT lower lobe, with some associated ground-glass opacities. No pleural effusion. Mild emphysematous change in lung apices. Upper Abdomen: No acute findings within the upper abdomen. Musculoskeletal: No acute or suspicious osseous finding. Mild degenerative spondylosis within the thoracic and upper lumbar spine. IMPRESSION: 1. Patchy consolidations, most prominent within the medial aspects of the left lower lobe, with some associated ground-glass opacities. Findings are compatible with multifocal pneumonia. The associated ground-glass opacities suggest atypical pneumonia such as fungal or viral. In the appropriate clinical setting, would consider COVID-19 infection and would clinically exclude. 2. Coronary artery calcifications, particularly dense within the left anterior descending coronary artery. Recommend correlation with any possible associated cardiac symptoms. Heart size is normal. Aortic Atherosclerosis (ICD10-I70.0). These results were called by telephone at the time of interpretation on 06/14/2018 at 6:25 pm to Dr. Lavonia Drafts , who verbally acknowledged these results. Electronically Signed   By: Franki Cabot M.D.   On: 06/14/2018 18:23    ASSESSMENT AND PLAN:   Active Problems:   Multifocal pneumonia   Pneumonia  * Multifocal pneumonia- no signs of sepsis on admission.  CT chest with findings  consistent with possible COVID-19, patient has not traveled or been in contact with anybody who is traveled outside of the state recently. Continue ceftriaxone and azithromycin.   Check procalcitonin. Follow-up blood cultures. Call pulmonary consult as the CT findings were suggestive of atypical pneumonia, he suggested to continue treating for bacterial pneumonia for now.  * Hyponatremia- likely related to pneumonia and dehydration. Gentle IVFs. Recheck in the morning.  * Chronic atrial fibrillation- rate controlled .  Continue Eliquis.  * Hyperlipidemia-continue Lipitor  * Hypothyroidism-continue Synthroid  * OSA- CPAP nightly    All the records are reviewed and case discussed with Care Management/Social Workerr. Management plans discussed with the patient, family and they are in agreement.  CODE STATUS: Full code.  TOTAL TIME TAKING CARE OF THIS PATIENT: 35 minutes.     POSSIBLE D/C IN 2-3 DAYS, DEPENDING ON CLINICAL CONDITION.   Vaughan Basta M.D on 06/15/2018   Between 7am to 6pm - Pager - [phone]  After 6pm go to [email or web address] - password [email or web address] [place] [facility]  Office  [phone]  CC: Primary care physician; Perrin Maltese, MD  Note: This dictation was prepared with Dragon dictation along with smaller phrase technology. Any transcriptional errors that result from this process are unintentional.

## 2018-06-15 NOTE — Consult Note (Addendum)
[facility] Consultation      Assessment and Plan:  Pneumonia. -Patient's imaging findings and symptoms are consistent with pneumonia.  Suspect bacterial pneumonia, or a secondary bacterial infection.  Despite the radiology report, patient does not have any travel to known novel coronavirus areas nor has she had contact with anybody with travel outside of the state in the last 2 weeks. - Continue treatment with current antibiotics for community-acquired pneumonia. - We will check sputum culture. - Respiratory viral panel is currently pending.  Acute asthmatic bronchitis. - Bilateral scattered wheezing. - Continue scheduled duo nebs.  Obstructive sleep apnea. - Continue CPAP use nightly.  Date: 06/15/2018  MRN# [id number] Kieryn Burtis Brenn [birth year]  Referring Physician: Dr. Anselm Jungling for pneumonia.   Shakeeta Godette Weida is a 73 y.o. old female seen in consultation for chief complaint of:    Chief Complaint  Patient presents with  . Cough  . Chest Pain  . Headache    HPI:   The patient is a 73 year old female with past medical history which includes coronary artery disease, gastric reflux, hypertension, atrial fibrillation, sleep apnea on CPAP.  She presented to the ED with complaints of coughing, fever, body aches for about the last 5 days.  This is been accompanied by diffuse weakness, chills and chest tightness. Blood culture pending.  Initial white count of 11.7, which is since normalized.  She has remote history of smoking, quit about 20 years ago.  Patient notes that he is feeling slightly better than when she was admitted to the hospital.  She continues to have cough with respiratory tightness.  She denies any travel in the last 2 weeks outside of the state, nor has she had known contact with anybody who has travelled outside of the state.  **Chest x-ray and CT chest 06/14/2018>> images personally reviewed, there are areas of patchy consolidation, mostly in the left  lower lobe, medial segment.  Findings appear consistent with consolidation from pneumonia, I do not see any significant groundglass changes to suggest viral pneumonia.  PMHX:   Past Medical History:  Diagnosis Date  . Coronary artery disease    mild, nonobstructive  . Dysrhythmia    Atrial Fibrillation  . GERD (gastroesophageal reflux disease)   . Hyperlipidemia   . Hypertension   . Hypothyroidism   . 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 Morbid obesity ([place])   . Persistent atrial fibrillation   . Psoriasis   . Sleep apnea    compliant with CPAP  . Thyroid disease    Surgical Hx:  Past Surgical History:  Procedure Laterality Date  . ARTERY BIOPSY Right 07/21/2017   Procedure: BIOPSY TEMPORAL ARTERY;  Surgeon: [name], MD;  Location: [facility] ORS;  Service: Vascular;  Laterality: Right;  . CARDIAC CATHETERIZATION    . CARDIOVERSION Right 09/01/2016   Procedure: Cardioversion;  Surgeon: [name], MD;  Location: [facility] ORS;  Service: Cardiovascular;  Laterality: Right;  . CARDIOVERSION N/A 09/09/2016   Procedure: Cardioversion;  Surgeon: [name], MD;  Location: [facility] ORS;  Service: Cardiovascular;  Laterality: N/A;  . ELECTROPHYSIOLOGIC STUDY N/A 02/01/2016   Procedure: CARDIOVERSION;  Surgeon: [name], MD;  Location: [facility] ORS;  Service: Cardiovascular;  Laterality: N/A;  . ESOPHAGEAL DILATION    . ESOPHAGOGASTRODUODENOSCOPY (EGD) WITH PROPOFOL N/A 02/06/2017   Procedure: ESOPHAGOGASTRODUODENOSCOPY (EGD) WITH PROPOFOL;  Surgeon: [name], MD;  [facility] ENDOSCOPY;  Service: Gastroenterology;  Laterality: N/A;  .  EUS N/A 02/16/2017   Procedure: FULL UPPER ENDOSCOPIC ULTRASOUND (EUS) RADIAL;  Surgeon: [name], MD;  Location: [facility] ENDOSCOPY;  Service: Gastroenterology;  Laterality: N/A;  . LEFT HEART CATH AND CORONARY ANGIOGRAPHY N/A 09/08/2016   Procedure: Left Heart Cath and Coronary Angiography;  Surgeon: [name], MD;  Location:  [facility];  Service: Cardiovascular;  Laterality: N/A;  . US ECHOCARDIOGRAPHY     Family Hx:  Family History  Problem Relation Age of Onset  . Breast cancer Paternal Aunt   . Other Father   . Heart attack Father    Social Hx:   Social History   Tobacco Use  . Smoking status: Former Smoker    Types: Cigarettes    Last attempt to quit: 02/01/2013    Years since quitting: 5.3  . Smokeless tobacco: Never Used  Substance Use Topics  . Alcohol use: No  . Drug use: No   Medication:    Current Facility-Administered Medications:  .  0.9 %  sodium chloride infusion, , Intravenous, Continuous, Vaughan Basta, MD, Last Rate: 75 mL/hr at 06/15/18 1102 .  acetaminophen (TYLENOL) tablet 650 mg, 650 mg, Oral, Q6H PRN, 650 mg at 06/15/18 1111 **OR** acetaminophen (TYLENOL) suppository 650 mg, 650 mg, Rectal, Q6H PRN, Mayo, Pete Pelt, MD .  albuterol (PROVENTIL) (2.5 MG/3ML) 0.083% nebulizer solution 2.5 mg, 2.5 mg, Inhalation, Q6H PRN, Mayo, Pete Pelt, MD .  apixaban (ELIQUIS) tablet 5 mg, 5 mg, Oral, BID, Mayo, Pete Pelt, MD, 5 mg at 06/15/18 1040 .  atorvastatin (LIPITOR) tablet 20 mg, 20 mg, Oral, QPM, Mayo, Pete Pelt, MD, 20 mg at 06/14/18 2311 .  azelastine (ASTELIN) 0.1 % nasal spray 2 spray, 2 spray, Each Nare, BID PRN, Mayo, Pete Pelt, MD .  azithromycin (ZITHROMAX) 500 mg in sodium chloride 0.9 % 250 mL IVPB, 500 mg, Intravenous, Q24H, Mayo, Pete Pelt, MD .  baclofen (LIORESAL) tablet 10 mg, 10 mg, Oral, BID, Mayo, Pete Pelt, MD, 10 mg at 06/15/18 1040 .  cefTRIAXone (ROCEPHIN) 1 g in sodium chloride 0.9 % 100 mL IVPB, 1 g, Intravenous, Q24H, Mayo, Pete Pelt, MD .  doxepin (SINEQUAN) capsule 25 mg, 25 mg, Oral, QHS, Mayo, Pete Pelt, MD, 25 mg at 06/14/18 2312 .  gabapentin (NEURONTIN) capsule 300 mg, 300 mg, Oral, BID, Mayo, Pete Pelt, MD, 300 mg at 06/15/18 1040 .  ipratropium-albuterol (DUONEB) 0.5-2.5 (3) MG/3ML nebulizer solution 3 mL, 3 mL, Nebulization, Q6H,  Mayo, Pete Pelt, MD, 3 mL at 06/15/18 0824 .  levothyroxine (SYNTHROID, LEVOTHROID) tablet 50 mcg, 50 mcg, Oral, Q0600, Mayo, Pete Pelt, MD, 50 mcg at 06/15/18 0516 .  mometasone-formoterol (DULERA) 200-5 MCG/ACT inhaler 2 puff, 2 puff, Inhalation, BID, Mayo, Pete Pelt, MD, 2 puff at 06/15/18 1040 .  montelukast (SINGULAIR) tablet 10 mg, 10 mg, Oral, QHS, Mayo, Pete Pelt, MD, 10 mg at 06/14/18 2310 .  ondansetron (ZOFRAN) tablet 4 mg, 4 mg, Oral, Q6H PRN **OR** ondansetron (ZOFRAN) injection 4 mg, 4 mg, Intravenous, Q6H PRN, Mayo, Pete Pelt, MD .  pantoprazole (PROTONIX) EC tablet 40 mg, 40 mg, Oral, Daily, Mayo, Pete Pelt, MD, 40 mg at 06/15/18 1040 .  phenol (CHLORASEPTIC) mouth spray 2 spray, 2 spray, Mouth/Throat, PRN, Vaughan Basta, MD, 2 spray at 06/15/18 1041 .  polyethylene glycol (MIRALAX / GLYCOLAX) packet 17 g, 17 g, Oral, Daily PRN, Mayo, Pete Pelt, MD .  pregabalin (LYRICA) capsule 50 mg, 50 mg, Oral, Daily PRN, Mayo, Pete Pelt, MD  Allergies:  Codeine and Other  Review of Systems: Gen:  Denies  fever, sweats, chills HEENT: Denies blurred vision, double vision. bleeds, sore throat Cvc:  No dizziness, chest pain. Resp:   Denies cough or sputum production, shortness of breath Gi: Denies swallowing difficulty, stomach pain. Gu:  Denies bladder incontinence, burning urine Ext:   No Joint pain, stiffness. Skin: No skin rash,  hives  Endoc:  No polyuria, polydipsia. Psych: No depression, insomnia. Other:  All other systems were reviewed with the patient and were negative other that what is mentioned in the HPI.   Physical Examination:   VS: BP 132/74 (BP Location: Left Arm)   Pulse 91   Temp 97.8 F (36.6 C) (Axillary)   Resp 17   Ht 5' 4.5" (1.638 m)   Wt 122.5 kg   SpO2 95%   BMI 45.63 kg/m   General Appearance: No distress  Neuro:without focal findings,  speech normal,  HEENT: PERRLA, EOM intact.   Pulmonary: normal breath sounds, scattered bilateral  wheezing. CardiovascularNormal S1,S2.  No m/r/g.   Abdomen: Benign, Soft, non-tender. Renal:  No costovertebral tenderness  GU:  No performed at this time. Endoc: No evident thyromegaly, no signs of acromegaly. Skin:   warm, no rashes, no ecchymosis  Extremities: normal, no cyanosis, clubbing.  Other findings:    LABORATORY PANEL:   CBC Recent Labs  Lab 06/15/18 0511  WBC 8.6  HGB 14.9  HCT 45.4  PLT 265   ------------------------------------------------------------------------------------------------------------------  Chemistries  Recent Labs  Lab 06/15/18 0511  NA 133*  K 3.6  CL 103  CO2 20*  GLUCOSE 247*  BUN 23  CREATININE 1.27*  CALCIUM 8.4*   ------------------------------------------------------------------------------------------------------------------  Cardiac Enzymes Recent Labs  Lab 06/14/18 1502  TROPONINI <0.03   ------------------------------------------------------------  RADIOLOGY:  Dg Chest 2 View  Result Date: 06/14/2018 CLINICAL DATA:  Pt arrives with complaints of cough starting x 5 days ago. Pt reports cough is productive. Pt also reports intermittent chest pain of the right side that pt reports as a sharp pain. Patient also reports fever and vomiting. Hx of MI, HTN, CAD. Former smoker- quit in 2014. EXAM: CHEST - 2 VIEW COMPARISON:  03/27/2018 FINDINGS: Cardiac silhouette is top-normal in size. No mediastinal or hilar masses. No convincing adenopathy. There are chronically prominent bronchovascular markings bilaterally. There is no evidence of pneumonia or pulmonary edema. No pleural effusion or pneumothorax. Skeletal structures are intact. IMPRESSION: No acute cardiopulmonary disease. Electronically Signed   By: Lajean Manes M.D.   On: 06/14/2018 15:39   Ct Chest Wo Contrast  Result Date: 06/14/2018 CLINICAL DATA:  Cough starting 5 days ago, productive. Intermittent RIGHT-sided chest pain. Former smoker. EXAM: CT CHEST WITHOUT CONTRAST  TECHNIQUE: Multidetector CT imaging of the chest was performed following the standard protocol without IV contrast. COMPARISON:  None. FINDINGS: Cardiovascular: Heart size is within normal limits. Scattered coronary artery calcifications. Aortic atherosclerosis. No thoracic aortic aneurysm. Mediastinum/Nodes: No enlarged lymph nodes seen. Esophagus appears normal. Trachea appears normal. Lungs/Pleura: Patchy consolidations, most prominent within the medial aspects of the LEFT lower lobe, with some associated ground-glass opacities. No pleural effusion. Mild emphysematous change in lung apices. Upper Abdomen: No acute findings within the upper abdomen. Musculoskeletal: No acute or suspicious osseous finding. Mild degenerative spondylosis within the thoracic and upper lumbar spine. IMPRESSION: 1. Patchy consolidations, most prominent within the medial aspects of the left lower lobe, with some associated ground-glass opacities. Findings are compatible with multifocal pneumonia. The associated ground-glass opacities  suggest atypical pneumonia such as fungal or viral. In the appropriate clinical setting, would consider COVID-19 infection and would clinically exclude. 2. Coronary artery calcifications, particularly dense within the left anterior descending coronary artery. Recommend correlation with any possible associated cardiac symptoms. Heart size is normal. Aortic Atherosclerosis (ICD10-I70.0). These results were called by telephone at the time of interpretation on 06/14/2018 at 6:25 pm to Dr. Lavonia Drafts , who verbally acknowledged these results. Electronically Signed   By: Franki Cabot M.D.   On: 06/14/2018 18:23       Thank  you for the consultation and for allowing [facility] to assist in the care of your patient. Our recommendations are noted above.  Please contact us if we can be of further service.   Marda Stalker, M.D., F.C.C.P.  Board Certified in Internal Medicine,  Pulmonary Medicine, [place], and Sleep Medicine.  [facility] Office Number: [phone]   06/15/2018

## 2018-06-16 MED ORDER — METHYLPREDNISOLONE SODIUM SUCC 125 MG IJ SOLR
60.0000 mg | Freq: Two times a day (BID) | INTRAMUSCULAR | Status: DC
  Administered 2018-06-16 – 2018-06-18 (×5): 60 mg via INTRAVENOUS
  Filled 2018-06-16 (×5): qty 2

## 2018-06-16 MED ORDER — SODIUM CHLORIDE 0.9% FLUSH
3.0000 mL | INTRAVENOUS | Status: DC | PRN
  Administered 2018-06-16: 3 mL via INTRAVENOUS

## 2018-06-16 MED ORDER — BENZONATATE 100 MG PO CAPS
100.0000 mg | ORAL_CAPSULE | Freq: Three times a day (TID) | ORAL | Status: DC | PRN
  Administered 2018-06-16 – 2018-06-18 (×3): 100 mg via ORAL
  Filled 2018-06-16 (×3): qty 1

## 2018-06-16 NOTE — Plan of Care (Signed)
  Problem: Spiritual Needs Goal: Ability to function at adequate level Outcome: Progressing   Problem: Health Behavior/Discharge Planning: Goal: Ability to manage health-related needs will improve Outcome: Progressing   Problem: Clinical Measurements: Goal: Ability to maintain clinical measurements within normal limits will improve Outcome: Progressing Goal: Will remain free from infection Outcome: Progressing Goal: Respiratory complications will improve Outcome: Progressing   Problem: Activity: Goal: Risk for activity intolerance will decrease Outcome: Progressing   Problem: Nutrition: Goal: Adequate nutrition will be maintained Outcome: Progressing   Problem: Coping: Goal: Level of anxiety will decrease Outcome: Progressing   Problem: Elimination: Goal: Will not experience complications related to bowel motility Outcome: Progressing   Problem: Pain Managment: Goal: General experience of comfort will improve Outcome: Progressing   Problem: Safety: Goal: Ability to remain free from injury will improve Outcome: Progressing   Problem: Skin Integrity: Goal: Risk for impaired skin integrity will decrease Outcome: Progressing   Problem: Activity: Goal: Ability to tolerate increased activity will improve Outcome: Progressing   Problem: Clinical Measurements: Goal: Ability to maintain a body temperature in the normal range will improve Outcome: Progressing   Problem: Respiratory: Goal: Ability to maintain adequate ventilation will improve Outcome: Progressing Goal: Ability to maintain a clear airway will improve Outcome: Progressing

## 2018-06-16 NOTE — Progress Notes (Signed)
[facility] [place] NAME: Patricia Walker    MR#:  [id number]  DATE OF BIRTH:  15-Jan-1946  SUBJECTIVE:  CHIEF COMPLAINT:   Chief Complaint  Patient presents with  . Cough  . Chest Pain  . Headache   Continues to have cough, headache, weakness.  Fatigue  REVIEW OF SYSTEMS:    Review of Systems  Constitutional: Positive for malaise/fatigue. Negative for chills and fever.  HENT: Negative for sore throat.   Eyes: Negative for blurred vision, double vision and pain.  Respiratory: Positive for cough and shortness of breath. Negative for hemoptysis.   Cardiovascular: Negative for chest pain, palpitations, orthopnea and leg swelling.  Gastrointestinal: Negative for abdominal pain, constipation, diarrhea, heartburn, nausea and vomiting.  Genitourinary: Negative for dysuria and hematuria.  Musculoskeletal: Negative for back pain and joint pain.  Skin: Negative for rash.  Neurological: Negative for sensory change, speech change, focal weakness and headaches.  Endo/Heme/Allergies: Does not bruise/bleed easily.  Psychiatric/Behavioral: Negative for depression. The patient is not nervous/anxious.     DRUG ALLERGIES:   Allergies  Allergen Reactions  . Codeine Itching  . Other Itching and Other (See Comments)    States antibiotic in the past caused itching but can not remember name    VITALS:  Blood pressure 113/77, pulse 74, temperature 98.4 F (36.9 C), temperature source Oral, resp. rate 18, height 5' 4.5" (1.638 m), weight 122.5 kg, SpO2 94 %.  PHYSICAL EXAMINATION:   Physical Exam  GENERAL:  73 y.o.-year-old patient lying in the bed with conversational dyspnea EYES: Pupils equal, round, reactive to light and accommodation. No scleral icterus. Extraocular muscles intact.  HEENT: Head atraumatic, normocephalic. Oropharynx and nasopharynx clear.  NECK:  Supple, no jugular venous distention. No thyroid enlargement, no tenderness.  LUNGS:  Bilateral wheezing CARDIOVASCULAR: S1, S2 normal. No murmurs, rubs, or gallops.  ABDOMEN: Soft, nontender, nondistended. Bowel sounds present. No organomegaly or mass.  EXTREMITIES: No cyanosis, clubbing or edema b/l.    NEUROLOGIC: Cranial nerves II through XII are intact. No focal Motor or sensory deficits b/l.   PSYCHIATRIC: The patient is alert and oriented x 3.  SKIN: No obvious rash, lesion, or ulcer.   LABORATORY PANEL:   CBC Recent Labs  Lab 06/15/18 0511  WBC 8.6  HGB 14.9  HCT 45.4  PLT 265   ------------------------------------------------------------------------------------------------------------------ Chemistries  Recent Labs  Lab 06/15/18 0511  NA 133*  K 3.6  CL 103  CO2 20*  GLUCOSE 247*  BUN 23  CREATININE 1.27*  CALCIUM 8.4*   ------------------------------------------------------------------------------------------------------------------  Cardiac Enzymes Recent Labs  Lab 06/14/18 1502  TROPONINI <0.03   ------------------------------------------------------------------------------------------------------------------  RADIOLOGY:  Dg Chest 2 View  Result Date: 06/14/2018 CLINICAL DATA:  Pt arrives with complaints of cough starting x 5 days ago. Pt reports cough is productive. Pt also reports intermittent chest pain of the right side that pt reports as a sharp pain. Patient also reports fever and vomiting. Hx of MI, HTN, CAD. Former smoker- quit in 2014. EXAM: CHEST - 2 VIEW COMPARISON:  03/27/2018 FINDINGS: Cardiac silhouette is top-normal in size. No mediastinal or hilar masses. No convincing adenopathy. There are chronically prominent bronchovascular markings bilaterally. There is no evidence of pneumonia or pulmonary edema. No pleural effusion or pneumothorax. Skeletal structures are intact. IMPRESSION: No acute cardiopulmonary disease. Electronically Signed   By: Lajean Manes M.D.   On: 06/14/2018 15:39   Ct Chest Wo Contrast  Result Date:  06/14/2018 CLINICAL  DATA:  Cough starting 5 days ago, productive. Intermittent RIGHT-sided chest pain. Former smoker. EXAM: CT CHEST WITHOUT CONTRAST TECHNIQUE: Multidetector CT imaging of the chest was performed following the standard protocol without IV contrast. COMPARISON:  None. FINDINGS: Cardiovascular: Heart size is within normal limits. Scattered coronary artery calcifications. Aortic atherosclerosis. No thoracic aortic aneurysm. Mediastinum/Nodes: No enlarged lymph nodes seen. Esophagus appears normal. Trachea appears normal. Lungs/Pleura: Patchy consolidations, most prominent within the medial aspects of the LEFT lower lobe, with some associated ground-glass opacities. No pleural effusion. Mild emphysematous change in lung apices. Upper Abdomen: No acute findings within the upper abdomen. Musculoskeletal: No acute or suspicious osseous finding. Mild degenerative spondylosis within the thoracic and upper lumbar spine. IMPRESSION: 1. Patchy consolidations, most prominent within the medial aspects of the left lower lobe, with some associated ground-glass opacities. Findings are compatible with multifocal pneumonia. The associated ground-glass opacities suggest atypical pneumonia such as fungal or viral. In the appropriate clinical setting, would consider COVID-19 infection and would clinically exclude. 2. Coronary artery calcifications, particularly dense within the left anterior descending coronary artery. Recommend correlation with any possible associated cardiac symptoms. Heart size is normal. Aortic Atherosclerosis (ICD10-I70.0). These results were called by telephone at the time of interpretation on 06/14/2018 at 6:25 pm to Dr. Lavonia Drafts , who verbally acknowledged these results. Electronically Signed   By: Franki Cabot M.D.   On: 06/14/2018 18:23     ASSESSMENT AND PLAN:   * Bilateral pneumonia. Secondary to RSV and mycoplasma Will continue IV ceftriaxone and azithromycin Nebulizers Oxygen  as needed Cough medications  * Hyponatremia, improved  * Chronic atrial fibrillation-rate controlled . Continue Eliquis.  * Hyperlipidemia-continue Lipitor  * Hypothyroidism-continue Synthroid  * OSA- CPAP nightly  All the records are reviewed and case discussed with Care Management/Social Worker Management plans discussed with the patient, family and they are in agreement.  CODE STATUS: Full code  DVT Prophylaxis: SCDs  TOTAL TIME TAKING CARE OF THIS PATIENT: 35 minutes.   POSSIBLE D/C IN 1-2 DAYS, DEPENDING ON CLINICAL CONDITION.  Leia Alf Kaleigha Chamberlin M.D on 06/16/2018 at 12:08 PM  Between 7am to 6pm - Pager - [phone]  After 6pm go to [email or web address] - password [email or web address] [facility]  Office  [phone]  CC: Primary care physician; Perrin Maltese, MD  Note: This dictation was prepared with Dragon dictation along with smaller phrase technology. Any transcriptional errors that result from this process are unintentional.

## 2018-06-16 NOTE — Progress Notes (Signed)
Reports "I just feel bad". Spiked fever 101.9, recheck 101.1 with MD notified with tylenol effective with fever reduction.  Wheezes throughout; with some productive cough. Droplet isolation continued. Up in chair x 1 hours; up to BR with SB+ with DOE which resolves with rest.

## 2018-06-17 MED ORDER — ALBUTEROL SULFATE (2.5 MG/3ML) 0.083% IN NEBU: 2.5000 mg | INHALATION_SOLUTION | RESPIRATORY_TRACT | Status: DC | PRN

## 2018-06-17 MED ORDER — IPRATROPIUM-ALBUTEROL 0.5-2.5 (3) MG/3ML IN SOLN
3.0000 mL | Freq: Three times a day (TID) | RESPIRATORY_TRACT | Status: DC
  Administered 2018-06-17 – 2018-06-18 (×2): 3 mL via RESPIRATORY_TRACT
  Filled 2018-06-17 (×3): qty 3

## 2018-06-17 NOTE — Progress Notes (Signed)
[facility] [place] NAME: Patricia Walker    MR#:  [id number]  DATE OF BIRTH:  1945/04/20  SUBJECTIVE:  CHIEF COMPLAINT:   Chief Complaint  Patient presents with  . Cough  . Chest Pain  . Headache   Continues to have cough. Feels a little better today. Afebrile today 101.9 yesterday  REVIEW OF SYSTEMS:    Review of Systems  Constitutional: Positive for malaise/fatigue. Negative for chills and fever.  HENT: Negative for sore throat.   Eyes: Negative for blurred vision, double vision and pain.  Respiratory: Positive for cough and shortness of breath. Negative for hemoptysis.   Cardiovascular: Negative for chest pain, palpitations, orthopnea and leg swelling.  Gastrointestinal: Negative for abdominal pain, constipation, diarrhea, heartburn, nausea and vomiting.  Genitourinary: Negative for dysuria and hematuria.  Musculoskeletal: Negative for back pain and joint pain.  Skin: Negative for rash.  Neurological: Negative for sensory change, speech change, focal weakness and headaches.  Endo/Heme/Allergies: Does not bruise/bleed easily.  Psychiatric/Behavioral: Negative for depression. The patient is not nervous/anxious.     DRUG ALLERGIES:   Allergies  Allergen Reactions  . Codeine Itching  . Other Itching and Other (See Comments)    States antibiotic in the past caused itching but can not remember name    VITALS:  Blood pressure (!) 147/73, pulse 81, temperature (!) 97.5 F (36.4 C), temperature source Oral, resp. rate 16, height 5' 4.5" (1.638 m), weight 122.5 kg, SpO2 92 %.  PHYSICAL EXAMINATION:   Physical Exam  GENERAL:  73 y.o.-year-old patient lying in the bed with conversational dyspnea EYES: Pupils equal, round, reactive to light and accommodation. No scleral icterus. Extraocular muscles intact.  HEENT: Head atraumatic, normocephalic. Oropharynx and nasopharynx clear.  NECK:  Supple, no jugular venous distention. No  thyroid enlargement, no tenderness.  LUNGS: Bilateral wheezing CARDIOVASCULAR: S1, S2 normal. No murmurs, rubs, or gallops.  ABDOMEN: Soft, nontender, nondistended. Bowel sounds present. No organomegaly or mass.  EXTREMITIES: No cyanosis, clubbing or edema b/l.    NEUROLOGIC: Cranial nerves II through XII are intact. No focal Motor or sensory deficits b/l.   PSYCHIATRIC: The patient is alert and oriented x 3.  SKIN: No obvious rash, lesion, or ulcer.   LABORATORY PANEL:   CBC Recent Labs  Lab 06/15/18 0511  WBC 8.6  HGB 14.9  HCT 45.4  PLT 265   ------------------------------------------------------------------------------------------------------------------ Chemistries  Recent Labs  Lab 06/15/18 0511  NA 133*  K 3.6  CL 103  CO2 20*  GLUCOSE 247*  BUN 23  CREATININE 1.27*  CALCIUM 8.4*   ------------------------------------------------------------------------------------------------------------------  Cardiac Enzymes Recent Labs  Lab 06/14/18 1502  TROPONINI <0.03   ------------------------------------------------------------------------------------------------------------------  RADIOLOGY:  No results found.   ASSESSMENT AND PLAN:   * Bilateral pneumonia. Secondary to RSV and mycoplasma  continue IV ceftriaxone and azithromycin Nebulizers Oxygen as needed Cough medications Likely d/c in AM  * Hyponatremia, improved  * Chronic atrial fibrillation-rate controlled . Continue Eliquis.  * Hyperlipidemia-continue Lipitor  * Hypothyroidism-continue Synthroid  * OSA- CPAP nightly  All the records are reviewed and case discussed with Care Management/Social Worker Management plans discussed with the patient, family and they are in agreement.  CODE STATUS: Full code  DVT Prophylaxis: SCDs  TOTAL TIME TAKING CARE OF THIS PATIENT: 35 minutes.   POSSIBLE D/C IN 1-2 DAYS, DEPENDING ON CLINICAL CONDITION.  Leia Alf Cydney Alvarenga M.D on 06/17/2018 at 12:44  PM  Between 7am to 6pm -  Pager - [phone]  After 6pm go to [email or web address] - password [email or web address] [facility]  Office  [phone]  CC: Primary care physician; Perrin Maltese, MD  Note: This dictation was prepared with Dragon dictation along with smaller phrase technology. Any transcriptional errors that result from this process are unintentional.

## 2018-06-17 NOTE — Progress Notes (Signed)
Pt declined cpap

## 2018-06-18 LAB — CULTURE, RESPIRATORY W GRAM STAIN: Culture: NORMAL

## 2018-06-18 MED ORDER — LEVOFLOXACIN 500 MG PO TABS: 500.0000 mg | ORAL_TABLET | Freq: Every day | ORAL | 0 refills | Status: DC

## 2018-06-18 MED ORDER — PREDNISONE 10 MG (21) PO TBPK: ORAL_TABLET | ORAL | 0 refills | Status: DC

## 2018-06-18 MED ORDER — BENZONATATE 100 MG PO CAPS: 100.0000 mg | ORAL_CAPSULE | Freq: Three times a day (TID) | ORAL | 0 refills | Status: DC | PRN

## 2018-06-18 NOTE — Progress Notes (Signed)
Received Md order to discharge patient to home,, reviewed homes meds , discharge instructions and follow up appointments  with patient and patient verbalized understanding

## 2018-06-18 NOTE — Discharge Instructions (Signed)
Resume diet and activity as before ° ° °

## 2018-06-18 NOTE — Progress Notes (Signed)
Pt declined cpap

## 2018-06-19 LAB — CULTURE, BLOOD (ROUTINE X 2)
CULTURE: NO GROWTH
Culture: NO GROWTH
SPECIAL REQUESTS: ADEQUATE
Special Requests: ADEQUATE

## 2018-06-25 ENCOUNTER — Emergency Department: Payer: Medicare HMO

## 2018-06-25 ENCOUNTER — Other Ambulatory Visit: Payer: Self-pay

## 2018-06-25 ENCOUNTER — Encounter: Payer: Self-pay | Admitting: Emergency Medicine

## 2018-06-25 ENCOUNTER — Emergency Department
Admission: EM | Admit: 2018-06-25 | Discharge: 2018-06-25 | Disposition: A | Discharge status: Home / Self Care | Payer: Medicare HMO | Attending: Emergency Medicine | Admitting: Emergency Medicine

## 2018-06-25 DIAGNOSIS — E039 Hypothyroidism, unspecified: Secondary | ICD-10-CM | POA: Insufficient documentation

## 2018-06-25 DIAGNOSIS — B029 Zoster without complications: Secondary | ICD-10-CM

## 2018-06-25 DIAGNOSIS — Z7901 Long term (current) use of anticoagulants: Secondary | ICD-10-CM | POA: Diagnosis not present

## 2018-06-25 DIAGNOSIS — Z79899 Other long term (current) drug therapy: Secondary | ICD-10-CM | POA: Diagnosis not present

## 2018-06-25 DIAGNOSIS — I2510 Atherosclerotic heart disease of native coronary artery without angina pectoris: Secondary | ICD-10-CM | POA: Insufficient documentation

## 2018-06-25 DIAGNOSIS — Z87891 Personal history of nicotine dependence: Secondary | ICD-10-CM | POA: Diagnosis not present

## 2018-06-25 DIAGNOSIS — I10 Essential (primary) hypertension: Secondary | ICD-10-CM | POA: Diagnosis not present

## 2018-06-25 DIAGNOSIS — R21 Rash and other nonspecific skin eruption: Secondary | ICD-10-CM | POA: Diagnosis not present

## 2018-06-25 DIAGNOSIS — R0602 Shortness of breath: Secondary | ICD-10-CM | POA: Insufficient documentation

## 2018-06-25 DIAGNOSIS — I252 Old myocardial infarction: Secondary | ICD-10-CM | POA: Insufficient documentation

## 2018-06-25 DIAGNOSIS — R062 Wheezing: Secondary | ICD-10-CM | POA: Insufficient documentation

## 2018-06-25 LAB — CBC WITH DIFFERENTIAL/PLATELET
ABS IMMATURE GRANULOCYTES: 0.26 10*3/uL — AB (ref 0.00–0.07)
Basophils Absolute: 0.1 10*3/uL (ref 0.0–0.1)
Basophils Relative: 1 %
Eosinophils Absolute: 0.1 10*3/uL (ref 0.0–0.5)
Eosinophils Relative: 1 %
HCT: 48.6 % — ABNORMAL HIGH (ref 36.0–46.0)
Hemoglobin: 15.6 g/dL — ABNORMAL HIGH (ref 12.0–15.0)
Immature Granulocytes: 3 %
Lymphocytes Relative: 24 %
Lymphs Abs: 2.3 10*3/uL (ref 0.7–4.0)
MCH: 27.3 pg (ref 26.0–34.0)
MCHC: 32.1 g/dL (ref 30.0–36.0)
MCV: 85.1 fL (ref 80.0–100.0)
Monocytes Absolute: 0.8 10*3/uL (ref 0.1–1.0)
Monocytes Relative: 8 %
NEUTROS ABS: 6.3 10*3/uL (ref 1.7–7.7)
NEUTROS PCT: 63 %
Platelets: 412 10*3/uL — ABNORMAL HIGH (ref 150–400)
RBC: 5.71 MIL/uL — ABNORMAL HIGH (ref 3.87–5.11)
RDW: 13.6 % (ref 11.5–15.5)
WBC: 9.8 10*3/uL (ref 4.0–10.5)
nRBC: 0 % (ref 0.0–0.2)

## 2018-06-25 LAB — COMPREHENSIVE METABOLIC PANEL
ALT: 20 U/L (ref 0–44)
AST: 18 U/L (ref 15–41)
Albumin: 3.3 g/dL — ABNORMAL LOW (ref 3.5–5.0)
Alkaline Phosphatase: 77 U/L (ref 38–126)
Anion gap: 8 (ref 5–15)
BUN: 25 mg/dL — ABNORMAL HIGH (ref 8–23)
CO2: 28 mmol/L (ref 22–32)
Calcium: 9.1 mg/dL (ref 8.9–10.3)
Chloride: 101 mmol/L (ref 98–111)
Creatinine, Ser: 1.19 mg/dL — ABNORMAL HIGH (ref 0.44–1.00)
GFR calc Af Amer: 53 mL/min — ABNORMAL LOW (ref >60)
GFR, EST NON AFRICAN AMERICAN: 46 mL/min — AB (ref >60)
Glucose, Bld: 145 mg/dL — ABNORMAL HIGH (ref 70–99)
Potassium: 3.9 mmol/L (ref 3.5–5.1)
Sodium: 137 mmol/L (ref 135–145)
TOTAL PROTEIN: 6.8 g/dL (ref 6.5–8.1)
Total Bilirubin: 0.8 mg/dL (ref 0.3–1.2)

## 2018-06-25 MED ORDER — PREDNISONE 20 MG PO TABS
60.0000 mg | ORAL_TABLET | Freq: Once | ORAL | Status: AC
  Administered 2018-06-25: 60 mg via ORAL
  Filled 2018-06-25: qty 6

## 2018-06-25 MED ORDER — IPRATROPIUM-ALBUTEROL 0.5-2.5 (3) MG/3ML IN SOLN
3.0000 mL | Freq: Once | RESPIRATORY_TRACT | Status: AC
  Administered 2018-06-25: 3 mL via RESPIRATORY_TRACT
  Filled 2018-06-25: qty 3

## 2018-06-25 MED ORDER — METHYLPREDNISOLONE SODIUM SUCC 125 MG IJ SOLR
125.0000 mg | Freq: Once | INTRAMUSCULAR | Status: DC
  Filled 2018-06-25: qty 2

## 2018-06-25 MED ORDER — VALACYCLOVIR HCL 1 G PO TABS: 1000.0000 mg | ORAL_TABLET | Freq: Three times a day (TID) | ORAL | 0 refills | Status: DC

## 2018-06-25 MED ORDER — VALACYCLOVIR HCL 500 MG PO TABS
1000.0000 mg | ORAL_TABLET | Freq: Once | ORAL | Status: AC
  Administered 2018-06-25: 1000 mg via ORAL
  Filled 2018-06-25: qty 2

## 2018-06-25 MED ORDER — PREDNISONE 10 MG (21) PO TBPK: ORAL_TABLET | ORAL | 0 refills | Status: DC

## 2018-06-25 MED ORDER — VALACYCLOVIR HCL 1 G PO TABS: 1000.0000 mg | ORAL_TABLET | Freq: Three times a day (TID) | ORAL | 0 refills | Status: AC

## 2018-06-25 NOTE — ED Notes (Signed)
Lab contacted about blood work not being ran. Levada Dy in lab reports she found tubes and will run now

## 2018-06-25 NOTE — ED Triage Notes (Addendum)
Pt presents to Ed via POV with c/o SOB. Pt reports that she was dx with RSV and pneumonia and tested for coronavirus by her PCP on Friday, has not received test results yet. Pt with noted dry, strong, cough at this time. Pt also c/o "places on [her] bottom". Pt states wounds started after she was discharged from hospital.

## 2018-06-25 NOTE — ED Notes (Signed)
Pt c/o sores on sacrum.

## 2018-06-25 NOTE — Discharge Instructions (Signed)
Please seek medical attention for any high fevers, chest pain, shortness of breath, change in behavior, persistent vomiting, bloody stool or any other new or concerning symptoms.  

## 2018-06-25 NOTE — ED Provider Notes (Signed)
[facility] Emergency Department Provider Note  ____________________________________________   I have reviewed the triage vital signs and the nursing notes.   HISTORY  Chief Complaint Shortness of Breath   History limited by: Not Limited   HPI Patricia Walker is a 73 y.o. female who presents to the emergency department today because of concern for wheezing, shortness of breath as well as rash on her buttocks. The patient did have a recent admission to the hospital for community acquired pneumonia. Followed up with her doctor 3 days ago. Did have wheezing at that time but it does not sound like her doctor started her on any medication for it. She has also noticed a rash to her right buttock. It is painful. When she called her doctor today she was told to present to the emergency department. She denies any fevers.    Per medical record review patient has a history of CAD, HTN, HLD, recent admission for pneumonia.   Past Medical History:  Diagnosis Date  . Coronary artery disease    mild, nonobstructive  . Dysrhythmia    Atrial Fibrillation  . GERD (gastroesophageal reflux disease)   . Hyperlipidemia   . Hypertension   . Hypothyroidism   . 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 Morbid obesity ([place])   . Persistent atrial fibrillation   . Psoriasis   . Sleep apnea    compliant with CPAP  . Thyroid disease     Patient Active Problem List   Diagnosis Date Noted  . Pneumonia 06/15/2018  . Multifocal pneumonia 06/14/2018  . Unstable angina ([place]) 06/13/2017  . New onset of headaches 06/12/2017  . Photophobia of right eye 06/12/2017  . Osteoarthritis of knee 11/17/2016  . Coronary artery disease 09/16/2016  . History of cardioversion 09/16/2016  . Hyperlipidemia, unspecified 09/16/2016  . Hypertension 09/16/2016  . Obesity, unspecified 09/16/2016  . Atrial fibrillation ([place]) 09/08/2016  . Cardiac arrhythmia 09/09/2015  . Gastroesophageal reflux  disease 09/09/2015  . Obstructive sleep apnea syndrome 09/09/2015  . Psoriasis 09/09/2015    Past Surgical History:  Procedure Laterality Date  . ARTERY BIOPSY Right 07/21/2017   Procedure: BIOPSY TEMPORAL ARTERY;  Surgeon: [name], MD;  Location: [facility] ORS;  Service: Vascular;  Laterality: Right;  . CARDIAC CATHETERIZATION    . CARDIOVERSION Right 09/01/2016   Procedure: Cardioversion;  Surgeon: [name], MD;  Location: [facility] ORS;  Service: Cardiovascular;  Laterality: Right;  . CARDIOVERSION N/A 09/09/2016   Procedure: Cardioversion;  Surgeon: [name], MD;  Location: [facility] ORS;  Service: Cardiovascular;  Laterality: N/A;  . ELECTROPHYSIOLOGIC STUDY N/A 02/01/2016   Procedure: CARDIOVERSION;  Surgeon: [name], MD;  Location: [facility] ORS;  Service: Cardiovascular;  Laterality: N/A;  . ESOPHAGEAL DILATION    . ESOPHAGOGASTRODUODENOSCOPY (EGD) WITH PROPOFOL N/A 02/06/2017   Procedure: ESOPHAGOGASTRODUODENOSCOPY (EGD) WITH PROPOFOL;  Surgeon: [name], MD;  Location: [facility];  Service: Gastroenterology;  Laterality: N/A;  . EUS N/A 02/16/2017   Procedure: FULL UPPER ENDOSCOPIC ULTRASOUND (EUS) RADIAL;  Surgeon: [name], MD;  Location: [facility] ENDOSCOPY;  Service: Gastroenterology;  Laterality: N/A;  . LEFT HEART CATH AND CORONARY ANGIOGRAPHY N/A 09/08/2016   Procedure: Left Heart Cath and Coronary Angiography;  Surgeon: [name], MD;  Location: [facility];  Service: Cardiovascular;  Laterality: N/A;  . US ECHOCARDIOGRAPHY      Prior to Admission medications   Medication Sig Start Date End Date Taking?  Authorizing Provider  albuterol (PROVENTIL HFA;VENTOLIN HFA) 108 (90 Base) MCG/ACT inhaler Inhale 2 puffs into the lungs every 6 (six) hours as needed for wheezing or shortness of breath.    [provider]  apixaban (ELIQUIS) 5 MG TABS tablet Take 5 mg by mouth 2 (two) times daily.    [provider]  atorvastatin  (LIPITOR) 20 MG tablet Take 20 mg by mouth every evening. 08/25/16   [provider]  azelastine (ASTELIN) 0.1 % nasal spray Place 2 sprays into both nostrils 2 (two) times daily as needed for rhinitis or allergies.  10/13/16   [provider]  baclofen (LIORESAL) 10 MG tablet Take 10 mg by mouth 2 (two) times daily. 06/13/17   [provider]  benzonatate (TESSALON) 100 MG capsule Take 1 capsule (100 mg total) by mouth 3 (three) times daily as needed for cough. 06/18/18   Hillary Bow, MD  budesonide-formoterol (SYMBICORT) 160-4.5 MCG/ACT inhaler Inhale 2 puffs into the lungs 2 (two) times daily.     [provider]  doxepin (SINEQUAN) 25 MG capsule Take 25 mg by mouth at bedtime.    [provider]  esomeprazole (NEXIUM) 40 MG capsule Take 40 mg by mouth daily. 07/01/17   [provider]  furosemide (LASIX) 20 MG tablet Take 20 mg by mouth daily as needed for fluid. 08/24/16   [provider]  gabapentin (NEURONTIN) 300 MG capsule Take 300 mg by mouth 2 (two) times daily.    [provider]  levofloxacin (LEVAQUIN) 500 MG tablet Take 1 tablet (500 mg total) by mouth daily. 06/18/18   Hillary Bow, MD  levothyroxine (SYNTHROID, LEVOTHROID) 50 MCG tablet Take 50 mcg by mouth daily before breakfast.    [provider]  montelukast (SINGULAIR) 10 MG tablet Take 10 mg by mouth at bedtime.  05/26/17   [provider]  predniSONE (STERAPRED UNI-PAK 21 TAB) 10 MG (21) TBPK tablet 6 tabs day 1 and taper 10 mg a day - 6 days 06/18/18   Hillary Bow, MD  pregabalin (LYRICA) 50 MG capsule Take 50 mg by mouth daily as needed for pain.    [provider]  ranitidine (ZANTAC) 150 MG tablet Take 150 mg by mouth 2 (two) times daily. 04/27/17   [provider]  SKYRIZI, 150 MG DOSE, 75 MG/0.83ML PSKT Inject 1 Dose into the skin every 3 (three) months. 05/09/18   [provider]  triamcinolone ointment (KENALOG)  0.1 % Apply 1 application topically as directed. 05/09/18   [provider]    Allergies Codeine and Other  Family History  Problem Relation Age of Onset  . Breast cancer Paternal Aunt   . Other Father   . Heart attack Father     Social History Social History   Tobacco Use  . Smoking status: Former Smoker    Types: Cigarettes    Last attempt to quit: 02/01/2013    Years since quitting: 5.3  . Smokeless tobacco: Never Used  Substance Use Topics  . Alcohol use: No  . Drug use: No    Review of Systems Constitutional: No fever/chills Eyes: No visual changes. ENT: No sore throat. Cardiovascular: Denies chest pain. Respiratory: Positive for shortness of breath. Gastrointestinal: No abdominal pain.  No nausea, no vomiting.  No diarrhea.   Genitourinary: Negative for dysuria. Musculoskeletal: Negative for back pain. Skin: Positive for rash to right buttocks.  Neurological: Negative for headaches, focal weakness or numbness.  ____________________________________________   PHYSICAL  EXAM:  VITAL SIGNS: ED Triage Vitals  Enc Vitals Group     BP 06/25/18 1551 135/82     Pulse Rate 06/25/18 1551 82     Resp 06/25/18 1551 18     Temp 06/25/18 1551 98.5 F (36.9 C)     Temp Source 06/25/18 1551 Oral     SpO2 06/25/18 1551 96 %     Weight 06/25/18 1552 269 lb (122 kg)     Height 06/25/18 1552 5\' 4"  (1.626 m)   Constitutional: Alert and oriented.  Eyes: Conjunctivae are normal.  ENT      Head: Normocephalic and atraumatic.      Nose: No congestion/rhinnorhea.      Mouth/Throat: Mucous membranes are moist.      Neck: No stridor. Hematological/Lymphatic/Immunilogical: No cervical lymphadenopathy. Cardiovascular: Normal rate, regular rhythm.  No murmurs, rubs, or gallops.  Respiratory: Normal respiratory effort without tachypnea nor retractions. Diffuse expiratory wheezing. Gastrointestinal: Soft and non tender. No rebound. No guarding.  Genitourinary:  Deferred Musculoskeletal: Normal range of motion in all extremities. No lower extremity edema. Neurologic:  Normal speech and language. No gross focal neurologic deficits are appreciated.  Skin:  Vesicular rash to right buttock Psychiatric: Mood and affect are normal. Speech and behavior are normal. Patient exhibits appropriate insight and judgment.  ____________________________________________    LABS (pertinent positives/negatives)  CMP na 137, k 3.9, glu 145, cr 1.19 CBC wbc 9.8, hgb 15.6, plt 412  ____________________________________________   EKG  I, Nance Pear, attending physician, personally viewed and interpreted this EKG  EKG Time: 1550 Rate: 82 Rhythm: atrial fibrillation Axis: left axis deviation Intervals: qtc 460 QRS: LVH ST changes: no st elevation Impression: abnormal ekg  ____________________________________________    RADIOLOGY  CXR No acute disease  ____________________________________________   PROCEDURES  Procedures  ____________________________________________   INITIAL IMPRESSION / ASSESSMENT AND PLAN / ED COURSE  Pertinent labs & imaging results that were available during my care of the patient were reviewed by me and considered in my medical decision making (see chart for details).   Patient presented to the emergency department for shortness of breath and rash. In terms of the shortness of breath patient did have diffuse wheezing. cxr without any concerning findings for pneumonia and patient with elevated white count. Patient did feel better after duoneb treatment so I think likely reactive airway/copd type of pathology. In terms of the rash it is consistent with shingles. Discussed this with the patient. Will plan on starting valtrex.   ____________________________________________   FINAL CLINICAL IMPRESSION(S) / ED DIAGNOSES  Final diagnoses:  SOB (shortness of breath)  Herpes zoster without complication     Note: This  dictation was prepared with Dragon dictation. Any transcriptional errors that result from this process are unintentional     Nance Pear, MD 06/25/18 2029

## 2018-06-26 NOTE — Discharge Summary (Signed)
[place] [facility] [place] NAME: Patricia Walker    MR#:  [id number]  DATE OF BIRTH:  [birth year]  DATE OF ADMISSION:  06/14/2018 ADMITTING PHYSICIAN: Sela Hua, MD  DATE OF DISCHARGE: 06/18/2018  2:27 PM  PRIMARY CARE PHYSICIAN: Perrin Maltese, MD   ADMISSION DIAGNOSIS:  Community acquired pneumonia, unspecified laterality [J18.9]  DISCHARGE DIAGNOSIS:  Active Problems:   Multifocal pneumonia   Pneumonia   SECONDARY DIAGNOSIS:   Past Medical History:  Diagnosis Date  . Coronary artery disease    mild, nonobstructive  . Dysrhythmia    Atrial Fibrillation  . GERD (gastroesophageal reflux disease)   . Hyperlipidemia   . Hypertension   . Hypothyroidism   . 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 Morbid obesity ([place])   . Persistent atrial fibrillation   . Psoriasis   . Sleep apnea    compliant with CPAP  . Thyroid disease      ADMITTING HISTORY  HISTORY OF PRESENT ILLNESS:  Patricia Walker  is a 73 y.o. female with a known history of chronic atrial fibrillation, hypertension, hyperlipidemia, hypothyroidism, CAD, OSA who presented to the ED with cough, fevers, chills, and shortness of breath over the last 4 days.  Her symptoms have been getting progressively worse.  She feels very weak and tired.  Her shortness of breath is occurring on ambulation and at rest.  She denies any chest pain or abdominal pain.  In the ED, vitals were unremarkable.  Labs are significant for WBC 11.7.  Chest x-ray was negative.  CT chest showed patchy consolidations, consistent with multifocal pneumonia.  She was started on ceftriaxone and azithromycin.  Hospitalists were called for admission.   HOSPITAL COURSE:   *Bilateral pneumonia. Secondary to RSV and mycoplasma  continue IV ceftriaxone and azithromycin Nebulizers scheduled in the hospital Oxygen as needed Cough medications Patient afebrile for over 24 hours prior to discharge.  Breathing much  improved.  Normal WBC.  Cultures negative.  Patient discharged home in stable condition with oral antibiotics and prednisone taper with inhalers.  *Hyponatremia, improved  *Chronic atrial fibrillation-rate controlled . Continue Eliquis.  *Hyperlipidemia-continue Lipitor  *Hypothyroidism-continue Synthroid  *OSA-CPAP nightly  Stable for discharge home.  Follow-up with primary care physician in 1 week.  CONSULTS OBTAINED:    DRUG ALLERGIES:   Allergies  Allergen Reactions  . Codeine Itching  . Other Itching and Other (See Comments)    States antibiotic in the past caused itching but can not remember name    DISCHARGE MEDICATIONS:   Allergies as of 06/18/2018      Reactions   Codeine Itching   Other Itching, Other (See Comments)   States antibiotic in the past caused itching but can not remember name      Medication List    STOP taking these medications   ondansetron 4 MG disintegrating tablet Commonly known as:  Zofran ODT   traMADol 50 MG tablet Commonly known as:  Ultram     TAKE these medications   albuterol 108 (90 Base) MCG/ACT inhaler Commonly known as:  PROVENTIL HFA;VENTOLIN HFA Inhale 2 puffs into the lungs every 6 (six) hours as needed for wheezing or shortness of breath.   apixaban 5 MG Tabs tablet Commonly known as:  ELIQUIS Take 5 mg by mouth 2 (two) times daily.   atorvastatin 20 MG tablet Commonly known as:  LIPITOR Take 20 mg by mouth every evening.   azelastine  0.1 % nasal spray Commonly known as:  ASTELIN Place 2 sprays into both nostrils 2 (two) times daily as needed for rhinitis or allergies.   baclofen 10 MG tablet Commonly known as:  LIORESAL Take 10 mg by mouth 2 (two) times daily.   benzonatate 100 MG capsule Commonly known as:  TESSALON Take 1 capsule (100 mg total) by mouth 3 (three) times daily as needed for cough.   budesonide-formoterol 160-4.5 MCG/ACT inhaler Commonly known as:  SYMBICORT Inhale 2 puffs  into the lungs 2 (two) times daily.   doxepin 25 MG capsule Commonly known as:  SINEQUAN Take 25 mg by mouth at bedtime.   esomeprazole 40 MG capsule Commonly known as:  NEXIUM Take 40 mg by mouth daily.   furosemide 20 MG tablet Commonly known as:  LASIX Take 20 mg by mouth daily as needed for fluid.   gabapentin 300 MG capsule Commonly known as:  NEURONTIN Take 300 mg by mouth 2 (two) times daily.   levofloxacin 500 MG tablet Commonly known as:  Levaquin Take 1 tablet (500 mg total) by mouth daily.   levothyroxine 50 MCG tablet Commonly known as:  SYNTHROID, LEVOTHROID Take 50 mcg by mouth daily before breakfast.   montelukast 10 MG tablet Commonly known as:  SINGULAIR Take 10 mg by mouth at bedtime.   predniSONE 10 MG (21) Tbpk tablet Commonly known as:  STERAPRED UNI-PAK 21 TAB 6 tabs day 1 and taper 10 mg a day - 6 days   pregabalin 50 MG capsule Commonly known as:  LYRICA Take 50 mg by mouth daily as needed for pain.   ranitidine 150 MG tablet Commonly known as:  ZANTAC Take 150 mg by mouth 2 (two) times daily.   Skyrizi (150 MG Dose) 75 MG/0.83ML Pskt Generic drug:  Risankizumab-rzaa(150 MG Dose) Inject 1 Dose into the skin every 3 (three) months.   triamcinolone ointment 0.1 % Commonly known as:  KENALOG Apply 1 application topically as directed.       Today   VITAL SIGNS:  Blood pressure 135/84, pulse 78, temperature 97.6 F (36.4 C), temperature source Oral, resp. rate 17, height 5' 4.5" (1.638 m), weight 122.5 kg, SpO2 94 %.  I/O:  No intake or output data in the 24 hours ending 06/26/18 1648  PHYSICAL EXAMINATION:  Physical Exam  GENERAL:  73 y.o.-year-old patient lying in the bed with no acute distress.  LUNGS: Normal breath sounds bilaterally, no wheezing, rales,rhonchi or crepitation. No use of accessory muscles of respiration.  CARDIOVASCULAR: S1, S2 normal. No murmurs, rubs, or gallops.  ABDOMEN: Soft, non-tender, non-distended.  Bowel sounds present. No organomegaly or mass.  NEUROLOGIC: Moves all 4 extremities. PSYCHIATRIC: The patient is alert and oriented x 3.  SKIN: No obvious rash, lesion, or ulcer.   DATA REVIEW:   CBC Recent Labs  Lab 06/25/18 1557  WBC 9.8  HGB 15.6*  HCT 48.6*  PLT 412*    Chemistries  Recent Labs  Lab 06/25/18 1557  NA 137  K 3.9  CL 101  CO2 28  GLUCOSE 145*  BUN 25*  CREATININE 1.19*  CALCIUM 9.1  AST 18  ALT 20  ALKPHOS 77  BILITOT 0.8    Cardiac Enzymes No results for input(s): TROPONINI in the last 168 hours.  Microbiology Results  Results for orders placed or performed during the hospital encounter of 06/14/18  Blood culture (routine x 2)     Status: None   Collection Time: 06/14/18  6:31  PM  Result Value Ref Range Status   Specimen Description BLOOD LEFT ANTECUBITAL  Final   Special Requests   Final    BOTTLES DRAWN AEROBIC AND ANAEROBIC Blood Culture adequate volume   Culture   Final    NO GROWTH 5 DAYS Performed at [facility], [address] [place], [place] [address]    Report Status 06/19/2018 FINAL  Final  Blood culture (routine x 2)     Status: None   Collection Time: 06/14/18  6:37 PM  Result Value Ref Range Status   Specimen Description BLOOD BLOOD RIGHT HAND  Final   Special Requests   Final    BOTTLES DRAWN AEROBIC AND ANAEROBIC Blood Culture adequate volume   Culture   Final    NO GROWTH 5 DAYS Performed at [facility], [address] [place], [place] [address]    Report Status 06/19/2018 FINAL  Final  Respiratory Panel by PCR     Status: Abnormal   Collection Time: 06/15/18 10:46 AM  Result Value Ref Range Status   Adenovirus NOT DETECTED NOT DETECTED Final   Coronavirus 229E NOT DETECTED NOT DETECTED Final    Comment: (NOTE) The Coronavirus on the Respiratory Panel, DOES NOT test for the novel  Coronavirus (2019 nCoV)    Coronavirus HKU1 NOT DETECTED NOT DETECTED Final   Coronavirus NL63 NOT  DETECTED NOT DETECTED Final   Coronavirus OC43 NOT DETECTED NOT DETECTED Final   Metapneumovirus NOT DETECTED NOT DETECTED Final   Rhinovirus / Enterovirus NOT DETECTED NOT DETECTED Final   Influenza A NOT DETECTED NOT DETECTED Final   Influenza B NOT DETECTED NOT DETECTED Final   Parainfluenza Virus 1 NOT DETECTED NOT DETECTED Final   Parainfluenza Virus 2 NOT DETECTED NOT DETECTED Final   Parainfluenza Virus 3 NOT DETECTED NOT DETECTED Final   Parainfluenza Virus 4 NOT DETECTED NOT DETECTED Final   Respiratory Syncytial Virus DETECTED (A) NOT DETECTED Final    Comment: CRITICAL RESULT CALLED TO, READ BACK BY AND VERIFIED WITH: Roslynn Amble RN 17:05 06/15/18 ([email or web address])    Bordetella pertussis NOT DETECTED NOT DETECTED Final   Chlamydophila pneumoniae NOT DETECTED NOT DETECTED Final   Mycoplasma pneumoniae DETECTED (A) NOT DETECTED Final    Comment: Performed at [facility], [address], [place], [place] [address]  Expectorated sputum assessment w rflx to resp cult     Status: None   Collection Time: 06/15/18  3:47 PM  Result Value Ref Range Status   Specimen Description EXPECTORATED SPUTUM  Final   Special Requests NONE  Final   Sputum evaluation   Final    THIS SPECIMEN IS ACCEPTABLE FOR SPUTUM CULTURE Performed at [facility], [address], [place], [place] [address]    Report Status 06/15/2018 FINAL  Final  Culture, respiratory     Status: None   Collection Time: 06/15/18  3:47 PM  Result Value Ref Range Status   Specimen Description   Final    EXPECTORATED SPUTUM Performed at [facility], [address] [place], [place] [address]    Special Requests   Final    NONE Reflexed from [id number] Performed at [facility], [place], [place] [address]    Gram Stain   Final    ABUNDANT WBC PRESENT,BOTH PMN AND MONONUCLEAR FEW GRAM POSITIVE COCCI MODERATE GRAM VARIABLE ROD    Culture   Final    FEW Consistent with normal  respiratory flora. Performed at  [facility], [address] [place], [place] [address]    Report Status 06/18/2018 FINAL  Final    RADIOLOGY:  Dg Chest 2 View  Result Date: 06/25/2018 CLINICAL DATA:  Shortness of breath EXAM: CHEST - 2 VIEW COMPARISON:  CT chest 06/14/2018 FINDINGS: The heart size and mediastinal contours are within normal limits. Both lungs are clear. The visualized skeletal structures are unremarkable. IMPRESSION: No active cardiopulmonary disease. Electronically Signed   By: Kathreen Devoid   On: 06/25/2018 16:34    Follow up with PCP in 1 week.  Management plans discussed with the patient, family and they are in agreement.  CODE STATUS:  Code Status History    Date Active Date Inactive Code Status Order ID Comments User Context   06/14/2018 2227 06/18/2018 1837 Full Code [id number]  Sela Hua, MD Inpatient   09/08/2016 1336 09/11/2016 1512 Full Code [id number]  [name], MD Inpatient   09/08/2016 1333 09/08/2016 1336 Full Code [id number]  [name], MD Inpatient   09/08/2016 1309 09/08/2016 1333 Full Code [id number]  Bettey Costa, MD Inpatient      TOTAL TIME TAKING CARE OF THIS PATIENT ON DAY OF DISCHARGE: more than 30 minutes.   Leia Alf Ambers Iyengar M.D on 06/26/2018 at 4:48 PM  Between 7am to 6pm - Pager - [phone]  After 6pm go to [email or web address] - password [email or web address] [place] [facility]  Office  [phone]  CC: Primary care physician; Perrin Maltese, MD  Note: This dictation was prepared with Dragon dictation along with smaller phrase technology. Any transcriptional errors that result from this process are unintentional.

## 2018-09-26 ENCOUNTER — Telehealth: Payer: Self-pay

## 2018-09-26 NOTE — Telephone Encounter (Signed)
No,  I am not taking any new patients

## 2018-09-26 NOTE — Telephone Encounter (Signed)
Copied from [place] [id number]. Topic: Appointment Scheduling - Scheduling Inquiry for Clinic >> Sep 26, 2018 11:01 AM Virl Axe D wrote: Reason for CRM: Pt would like to see if Dr. Derrel Nip would take her on as a new pt. Stated she used to see her at [facility] Advised that Dr. Derrel Nip is not taking on new patients but would send the request. Pt asked to speak with Dr. Derrel Nip personally.

## 2018-09-27 ENCOUNTER — Telehealth: Payer: Self-pay

## 2018-09-27 NOTE — Telephone Encounter (Signed)
Copied from [place] [id number]. Topic: Appointment Scheduling - Scheduling Inquiry for Clinic >> Sep 26, 2018 11:01 AM Virl Axe D wrote: Reason for CRM: Pt would like to see if Dr. Derrel Nip would take her on as a new pt. Stated she used to see her at [facility] Advised that Dr. Derrel Nip is not taking on new patients but would send the request. Pt asked to speak with Dr. Derrel Nip personally.

## 2018-09-27 NOTE — Telephone Encounter (Signed)
Duplicate message. 

## 2018-09-27 NOTE — Telephone Encounter (Signed)
Spoke with pt to let her know that Dr. Derrel Nip is not accepting new pts at this time. Pt gave a verbal understanding.

## 2018-10-16 ENCOUNTER — Emergency Department: Admit: 2018-10-16 | Discharge: 2018-10-17 | Disposition: A | Discharge status: Home / Self Care | Payer: MEDICARE

## 2018-10-16 ENCOUNTER — Ambulatory Visit: Admit: 2018-10-16 | Discharge: 2018-10-17 | Disposition: A | Discharge status: Home / Self Care | Payer: MEDICARE

## 2018-10-16 DIAGNOSIS — R609 Edema, unspecified: Principal | ICD-10-CM

## 2018-10-16 DIAGNOSIS — L299 Pruritus, unspecified: Secondary | ICD-10-CM

## 2018-10-16 MED ORDER — DIAZEPAM 5 MG TABLET
ORAL_TABLET | Freq: Two times a day (BID) | ORAL | 0 refills | 0 days | Status: CP
Start: 2018-10-16 — End: 2018-10-19

## 2018-10-30 ENCOUNTER — Ambulatory Visit: Admit: 2018-10-30 | Payer: Medicare HMO | Admitting: Ophthalmology

## 2018-10-30 SURGERY — PHACOEMULSIFICATION, CATARACT, WITH IOL INSERTION
Anesthesia: Topical | Laterality: Right

## 2018-12-11 ENCOUNTER — Ambulatory Visit: Admit: 2018-12-11 | Discharge: 2018-12-12 | Payer: MEDICARE | Attending: Dermatology | Primary: Dermatology

## 2018-12-11 DIAGNOSIS — L409 Psoriasis, unspecified: Secondary | ICD-10-CM

## 2018-12-11 DIAGNOSIS — B86 Scabies: Secondary | ICD-10-CM

## 2018-12-11 DIAGNOSIS — L299 Pruritus, unspecified: Secondary | ICD-10-CM

## 2018-12-11 MED ORDER — CLOBETASOL 0.05 % TOPICAL OINTMENT
Freq: Two times a day (BID) | TOPICAL | 1 refills | 0.00000 days | Status: CP
Start: 2018-12-11 — End: 2018-12-25

## 2018-12-11 MED ORDER — IVERMECTIN 3 MG TABLET
ORAL_TABLET | 0 refills | 0 days | Status: CP
Start: 2018-12-11 — End: ?

## 2018-12-25 ENCOUNTER — Ambulatory Visit: Admit: 2018-12-25 | Discharge: 2018-12-26 | Payer: MEDICARE | Attending: Dermatology | Primary: Dermatology

## 2018-12-25 DIAGNOSIS — L409 Psoriasis, unspecified: Secondary | ICD-10-CM

## 2018-12-25 DIAGNOSIS — L299 Pruritus, unspecified: Secondary | ICD-10-CM

## 2018-12-25 MED ORDER — FOLIC ACID 1 MG TABLET
ORAL_TABLET | 5 refills | 0 days | Status: CP
Start: 2018-12-25 — End: ?

## 2018-12-25 MED ORDER — METHOTREXATE SODIUM 2.5 MG TABLET
ORAL_TABLET | 0 refills | 0 days | Status: CP
Start: 2018-12-25 — End: ?

## 2018-12-25 MED ORDER — SARNA SENSITIVE 1 % LOTION
Freq: Two times a day (BID) | TOPICAL | 6 refills | 0.00000 days | Status: CP
Start: 2018-12-25 — End: ?

## 2018-12-25 MED ORDER — CAMPHOR-MENTHOL 0.5 %-0.5 % LOTION
3 refills | 0 days | Status: CP
Start: 2018-12-25 — End: ?

## 2018-12-25 MED ORDER — CLOBETASOL 0.05 % TOPICAL OINTMENT
Freq: Two times a day (BID) | TOPICAL | 1 refills | 0.00000 days | Status: CP
Start: 2018-12-25 — End: ?

## 2019-01-26 DIAGNOSIS — L409 Psoriasis, unspecified: Principal | ICD-10-CM

## 2019-01-29 ENCOUNTER — Ambulatory Visit: Admit: 2019-01-29 | Discharge: 2019-01-30 | Payer: MEDICARE | Attending: Dermatology | Primary: Dermatology

## 2019-01-29 DIAGNOSIS — L409 Psoriasis, unspecified: Principal | ICD-10-CM

## 2019-01-29 DIAGNOSIS — L299 Pruritus, unspecified: Principal | ICD-10-CM

## 2019-01-29 MED ORDER — TRIAMCINOLONE ACETONIDE 0.1 % TOPICAL OINTMENT: g | Freq: Two times a day (BID) | 2 refills | 0 days | Status: AC

## 2019-01-29 MED ORDER — METHOTREXATE SODIUM 2.5 MG TABLET: tablet | 1 refills | 0 days | Status: AC

## 2019-01-29 MED ORDER — CLOBETASOL 0.05 % TOPICAL OINTMENT: 1 | g | Freq: Two times a day (BID) | 1 refills | 0 days | Status: AC

## 2019-01-29 MED ORDER — FOLIC ACID 1 MG TABLET: tablet | 5 refills | 0 days | Status: AC

## 2019-02-20 ENCOUNTER — Ambulatory Visit: Admit: 2019-02-20 | Discharge: 2019-02-21 | Payer: MEDICARE | Attending: Dermatology | Primary: Dermatology

## 2019-02-20 MED ORDER — TRIAMCINOLONE ACETONIDE 0.1 % TOPICAL OINTMENT
Freq: Two times a day (BID) | TOPICAL | 3 refills | 0.00000 days | Status: CP
Start: 2019-02-20 — End: 2020-02-20

## 2019-02-20 MED ORDER — FOLIC ACID 1 MG TABLET
ORAL_TABLET | 5 refills | 0 days | Status: CP
Start: 2019-02-20 — End: ?

## 2019-02-20 MED ORDER — METHOTREXATE SODIUM 2.5 MG TABLET
ORAL_TABLET | 2 refills | 0 days | Status: CP
Start: 2019-02-20 — End: ?

## 2019-03-22 ENCOUNTER — Other Ambulatory Visit
Admission: RE | Admit: 2019-03-22 | Discharge: 2019-03-22 | Disposition: A | Discharge status: Home / Self Care | Payer: Medicare HMO | Source: Ambulatory Visit | Attending: Ophthalmology | Admitting: Ophthalmology

## 2019-03-22 DIAGNOSIS — Z01812 Encounter for preprocedural laboratory examination: Secondary | ICD-10-CM | POA: Insufficient documentation

## 2019-03-22 DIAGNOSIS — Z20828 Contact with and (suspected) exposure to other viral communicable diseases: Secondary | ICD-10-CM | POA: Insufficient documentation

## 2019-03-22 LAB — SARS CORONAVIRUS 2 (TAT 6-24 HRS): SARS Coronavirus 2: NEGATIVE

## 2019-03-25 NOTE — Discharge Instructions (Signed)
General Anesthesia, Adult, Care After °This sheet gives you information about how to care for yourself after your procedure. Your health care provider Patricia Walker also give you more specific instructions. If you have problems or questions, contact your health care provider. °What can I expect after the procedure? °After the procedure, the following side effects are common: °· Pain or discomfort at the IV site. °· Nausea. °· Vomiting. °· Sore throat. °· Trouble concentrating. °· Feeling cold or chills. °· Weak or tired. °· Sleepiness and fatigue. °· Soreness and body aches. These side effects can affect parts of the body that were not involved in surgery. °Follow these instructions at home: ° °For at least 24 hours after the procedure: °· Have a responsible adult stay with you. It is important to have someone help care for you until you are awake and alert. °· Rest as needed. °· Do not: °? Participate in activities in which you could fall or become injured. °? Drive. °? Use heavy machinery. °? Drink alcohol. °? Take sleeping pills or medicines that cause drowsiness. °? Make important decisions or sign legal documents. °? Take care of children on your own. °Eating and drinking °· Follow any instructions from your health care provider about eating or drinking restrictions. °· When you feel hungry, start by eating small amounts of foods that are soft and easy to digest (bland), such as toast. Gradually return to your regular diet. °· Drink enough fluid to keep your urine pale yellow. °· If you vomit, rehydrate by drinking water, juice, or clear broth. °General instructions °· If you have sleep apnea, surgery and certain medicines can increase your risk for breathing problems. Follow instructions from your health care provider about wearing your sleep device: °? Anytime you are sleeping, including during daytime naps. °? While taking prescription pain medicines, sleeping medicines, or medicines that make you drowsy. °· Return to  your normal activities as told by your health care provider. Ask your health care provider what activities are safe for you. °· Take over-the-counter and prescription medicines only as told by your health care provider. °· If you smoke, do not smoke without supervision. °· Keep all follow-up visits as told by your health care provider. This is important. °Contact a health care provider if: °· You have nausea or vomiting that does not get better with medicine. °· You cannot eat or drink without vomiting. °· You have pain that does not get better with medicine. °· You are unable to pass urine. °· You develop a skin rash. °· You have a fever. °· You have redness around your IV site that gets worse. °Get help right away if: °· You have difficulty breathing. °· You have chest pain. °· You have blood in your urine or stool, or you vomit blood. °Summary °· After the procedure, it is common to have a sore throat or nausea. It is also common to feel tired. °· Have a responsible adult stay with you for the first 24 hours after general anesthesia. It is important to have someone help care for you until you are awake and alert. °· When you feel hungry, start by eating small amounts of foods that are soft and easy to digest (bland), such as toast. Gradually return to your regular diet. °· Drink enough fluid to keep your urine pale yellow. °· Return to your normal activities as told by your health care provider. Ask your health care provider what activities are safe for you. °This information is not   intended to replace advice given to you by your health care provider. Make sure you discuss any questions you have with your health care provider. °Document Released: 07/04/2000 Document Revised: 03/31/2017 Document Reviewed: 11/11/2016 °Elsevier Patient Education © 2020 [employer] Inc[employer] °Cataract Surgery, Care After °This sheet gives you information about how to care for yourself after your procedure. Your health care provider Patricia Walker also  give you more specific instructions. If you have problems or questions, contact your health care provider. °What can I expect after the procedure? °After the procedure, it is common to have: °· Itching. °· Discomfort. °· Fluid discharge. °· Sensitivity to light and to touch. °· Bruising in or around the eye. °· Mild blurred vision. °Follow these instructions at home: °Eye care ° °· Do not touch or rub your eyes. °· Protect your eyes as told by your health care provider. You Jolliff be told to wear a protective eye shield or sunglasses. °· Do not put a contact lens into the affected eye or eyes until your health care provider approves. °· Keep the area around your eye clean and dry: °? Avoid swimming. °? Do not allow water to hit you directly in the face while showering. °? Keep soap and shampoo out of your eyes. °· Check your eye every day for signs of infection. Watch for: °? Redness, swelling, or pain. °? Fluid, blood, or pus. °? Warmth. °? A bad smell. °? Vision that is getting worse. °? Sensitivity that is getting worse. °Activity °· Do not drive for 24 hours if you were given a sedative during your procedure. °· Avoid strenuous activities, such as playing contact sports, for as long as told by your health care provider. °· Do not drive or use heavy machinery until your health care provider approves. °· Do not bend or lift heavy objects. Bending increases pressure in the eye. You can walk, climb stairs, and do light household chores. °· Ask your health care provider when you can return to work. If you work in a dusty environment, you [place] be advised to wear protective eyewear for a period of time. °General instructions °· Take or apply over-the-counter and prescription medicines only as told by your health care provider. This includes eye drops. °· Keep all follow-up visits as told by your health care provider. This is important. °Contact a health care provider if: °· You have increased bruising around your  eye. °· You have pain that is not helped with medicine. °· You have a fever. °· You have redness, swelling, or pain in your eye. °· You have fluid, blood, or pus coming from your incision. °· Your vision gets worse. °· Your sensitivity to light gets worse. °Get help right away if: °· You have sudden loss of vision. °· You see flashes of light or spots (floaters). °· You have severe eye pain. °· You develop nausea or vomiting. °Summary °· After your procedure, it is common to have itching, discomfort, bruising, fluid discharge, or sensitivity to light. °· Follow instructions from your health care provider about caring for your eye after the procedure. °· Do not rub your eye after the procedure. You [place] need to wear eye protection or sunglasses. Do not wear contact lenses. Keep the area around your eye clean and dry. °· Avoid activities that require a lot of effort. These include playing sports and lifting heavy objects. °· Contact a health care provider if you have increased bruising, pain that does not go away, or a fever. Get   help right away if you suddenly lose your vision, see flashes of light or spots, or have severe pain in the eye. °This information is not intended to replace advice given to you by your health care provider. Make sure you discuss any questions you have with your health care provider. °Document Released: 10/15/2004 Document Revised: 09/25/2017 Document Reviewed: 09/25/2017 °Elsevier Patient Education © 2020 Elsevier Inc. ° °

## 2019-03-26 ENCOUNTER — Ambulatory Visit: Payer: Medicare HMO | Admitting: Anesthesiology

## 2019-03-26 ENCOUNTER — Encounter
Admission: RE | Disposition: A | Discharge status: Home / Self Care | Payer: Self-pay | Source: Home / Self Care | Attending: Ophthalmology

## 2019-03-26 ENCOUNTER — Encounter: Payer: Self-pay | Admitting: Ophthalmology

## 2019-03-26 ENCOUNTER — Other Ambulatory Visit: Payer: Self-pay

## 2019-03-26 ENCOUNTER — Ambulatory Visit
Admission: RE | Admit: 2019-03-26 | Discharge: 2019-03-26 | Disposition: A | Discharge status: Home / Self Care | Payer: Medicare HMO | Attending: Ophthalmology | Admitting: Ophthalmology

## 2019-03-26 DIAGNOSIS — J45909 Unspecified asthma, uncomplicated: Secondary | ICD-10-CM | POA: Insufficient documentation

## 2019-03-26 DIAGNOSIS — I252 Old myocardial infarction: Secondary | ICD-10-CM | POA: Diagnosis not present

## 2019-03-26 DIAGNOSIS — F419 Anxiety disorder, unspecified: Secondary | ICD-10-CM | POA: Insufficient documentation

## 2019-03-26 DIAGNOSIS — I10 Essential (primary) hypertension: Secondary | ICD-10-CM | POA: Diagnosis not present

## 2019-03-26 DIAGNOSIS — I4891 Unspecified atrial fibrillation: Secondary | ICD-10-CM | POA: Diagnosis not present

## 2019-03-26 DIAGNOSIS — Z6841 Body Mass Index (BMI) 40.0 and over, adult: Secondary | ICD-10-CM | POA: Insufficient documentation

## 2019-03-26 DIAGNOSIS — K219 Gastro-esophageal reflux disease without esophagitis: Secondary | ICD-10-CM | POA: Insufficient documentation

## 2019-03-26 DIAGNOSIS — E039 Hypothyroidism, unspecified: Secondary | ICD-10-CM | POA: Insufficient documentation

## 2019-03-26 DIAGNOSIS — Z7951 Long term (current) use of inhaled steroids: Secondary | ICD-10-CM | POA: Diagnosis not present

## 2019-03-26 DIAGNOSIS — Z79890 Hormone replacement therapy (postmenopausal): Secondary | ICD-10-CM | POA: Diagnosis not present

## 2019-03-26 DIAGNOSIS — Z79899 Other long term (current) drug therapy: Secondary | ICD-10-CM | POA: Insufficient documentation

## 2019-03-26 DIAGNOSIS — G4730 Sleep apnea, unspecified: Secondary | ICD-10-CM | POA: Diagnosis not present

## 2019-03-26 DIAGNOSIS — F329 Major depressive disorder, single episode, unspecified: Secondary | ICD-10-CM | POA: Insufficient documentation

## 2019-03-26 DIAGNOSIS — Z7901 Long term (current) use of anticoagulants: Secondary | ICD-10-CM | POA: Diagnosis not present

## 2019-03-26 DIAGNOSIS — H2511 Age-related nuclear cataract, right eye: Secondary | ICD-10-CM | POA: Insufficient documentation

## 2019-03-26 DIAGNOSIS — M1990 Unspecified osteoarthritis, unspecified site: Secondary | ICD-10-CM | POA: Diagnosis not present

## 2019-03-26 HISTORY — DX: Anxiety disorder, unspecified: F41.9

## 2019-03-26 HISTORY — DX: Unspecified osteoarthritis, unspecified site: M19.90

## 2019-03-26 HISTORY — DX: Unspecified asthma, uncomplicated: J45.909

## 2019-03-26 HISTORY — PX: CATARACT EXTRACTION W/PHACO: SHX586

## 2019-03-26 HISTORY — DX: Depression, unspecified: F32.A

## 2019-03-26 SURGERY — PHACOEMULSIFICATION, CATARACT, WITH IOL INSERTION
Anesthesia: Monitor Anesthesia Care | Site: Eye | Laterality: Right

## 2019-03-26 MED ORDER — EPINEPHRINE PF 1 MG/ML IJ SOLN
INTRAOCULAR | Status: DC | PRN
  Administered 2019-03-26: 44 mL via OPHTHALMIC

## 2019-03-26 MED ORDER — ONDANSETRON HCL 4 MG/2ML IJ SOLN: 4.0000 mg | Freq: Once | INTRAMUSCULAR | Status: DC | PRN

## 2019-03-26 MED ORDER — FENTANYL CITRATE (PF) 100 MCG/2ML IJ SOLN
INTRAMUSCULAR | Status: DC | PRN
  Administered 2019-03-26: 25 ug via INTRAVENOUS
  Administered 2019-03-26: 50 ug via INTRAVENOUS

## 2019-03-26 MED ORDER — MIDAZOLAM HCL 2 MG/2ML IJ SOLN
INTRAMUSCULAR | Status: DC | PRN
  Administered 2019-03-26: .5 mg via INTRAVENOUS
  Administered 2019-03-26: 1 mg via INTRAVENOUS

## 2019-03-26 MED ORDER — TETRACAINE HCL 0.5 % OP SOLN
1.0000 [drp] | OPHTHALMIC | Status: DC | PRN
  Administered 2019-03-26 (×2): 1 [drp] via OPHTHALMIC

## 2019-03-26 MED ORDER — LACTATED RINGERS IV SOLN: 100.0000 mL/h | INTRAVENOUS | Status: DC

## 2019-03-26 MED ORDER — ARMC OPHTHALMIC DILATING DROPS
1.0000 "application " | OPHTHALMIC | Status: DC | PRN
  Administered 2019-03-26 (×3): 1 via OPHTHALMIC

## 2019-03-26 MED ORDER — LACTATED RINGERS IV SOLN: INTRAVENOUS | Status: DC

## 2019-03-26 MED ORDER — LIDOCAINE HCL (PF) 2 % IJ SOLN
INTRAOCULAR | Status: DC | PRN
  Administered 2019-03-26: 2 mL

## 2019-03-26 MED ORDER — NA CHONDROIT SULF-NA HYALURON 40-17 MG/ML IO SOLN
INTRAOCULAR | Status: DC | PRN
  Administered 2019-03-26: 1 mL via INTRAOCULAR

## 2019-03-26 MED ORDER — BRIMONIDINE TARTRATE-TIMOLOL 0.2-0.5 % OP SOLN
OPHTHALMIC | Status: DC | PRN
  Administered 2019-03-26: 1 [drp] via OPHTHALMIC

## 2019-03-26 SURGICAL SUPPLY — 20 items
CANNULA ANT/CHMB 27G (MISCELLANEOUS) ×2 IMPLANT
CANNULA ANT/CHMB 27GA (MISCELLANEOUS) ×6 IMPLANT
GLOVE SURG LX 8.0 MICRO (GLOVE) ×2
GLOVE SURG LX STRL 8.0 MICRO (GLOVE) ×1 IMPLANT
GLOVE SURG TRIUMPH 8.0 PF LTX (GLOVE) ×3 IMPLANT
GOWN STRL REUS W/ TWL LRG LVL3 (GOWN DISPOSABLE) ×2 IMPLANT
GOWN STRL REUS W/TWL LRG LVL3 (GOWN DISPOSABLE) ×4
LENS IOL TECNIS ITEC 20.0 (Intraocular Lens) ×2 IMPLANT
MARKER SKIN DUAL TIP RULER LAB (MISCELLANEOUS) ×3 IMPLANT
NDL FILTER BLUNT 18X1 1/2 (NEEDLE) ×1 IMPLANT
NDL RETROBULBAR .5 NSTRL (NEEDLE) ×3 IMPLANT
NEEDLE FILTER BLUNT 18X 1/2SAF (NEEDLE) ×2
NEEDLE FILTER BLUNT 18X1 1/2 (NEEDLE) ×1 IMPLANT
PACK EYE AFTER SURG (MISCELLANEOUS) ×3 IMPLANT
PACK OPTHALMIC (MISCELLANEOUS) ×3 IMPLANT
PACK PORFILIO (MISCELLANEOUS) ×3 IMPLANT
SYR 3ML LL SCALE MARK (SYRINGE) ×3 IMPLANT
SYR TB 1ML LUER SLIP (SYRINGE) ×3 IMPLANT
WATER STERILE IRR 250ML POUR (IV SOLUTION) ×3 IMPLANT
WIPE NON LINTING 3.25X3.25 (MISCELLANEOUS) ×3 IMPLANT

## 2019-03-26 NOTE — Anesthesia Procedure Notes (Signed)
Procedure Name: MAC Performed by: Prabhav Faulkenberry, CRNA Pre-anesthesia Checklist: Patient identified, Emergency Drugs available, Suction available, Timeout performed and Patient being monitored Patient Re-evaluated:Patient Re-evaluated prior to induction Oxygen Delivery Method: Nasal cannula Placement Confirmation: positive ETCO2       

## 2019-03-26 NOTE — Anesthesia Preprocedure Evaluation (Signed)
Anesthesia Evaluation  Patient identified by MRN, date of birth, ID band Patient awake    Reviewed: Allergy & Precautions, NPO status   Airway Mallampati: II  TM Distance: >3 FB     Dental   Pulmonary asthma , sleep apnea , former smoker,    breath sounds clear to auscultation       Cardiovascular hypertension, + CAD (mild nonobstructive) and + Past MI  + dysrhythmias (afib)  Rhythm:Regular Rate:Normal  HLD   Neuro/Psych  Headaches, Anxiety Depression    GI/Hepatic GERD  ,  Endo/Other  Hypothyroidism Morbid obesity  Renal/GU      Musculoskeletal  (+) Arthritis ,   Abdominal   Peds  Hematology   Anesthesia Other Findings   Reproductive/Obstetrics                             Anesthesia Physical Anesthesia Plan  ASA: III  Anesthesia Plan: MAC   Post-op Pain Management:    Induction: Intravenous  PONV Risk Score and Plan: 2 and Treatment Whan vary due to age or medical condition and Midazolam  Airway Management Planned:   Additional Equipment:   Intra-op Plan:   Post-operative Plan:   Informed Consent: I have reviewed the patients History and Physical, chart, labs and discussed the procedure including the risks, benefits and alternatives for the proposed anesthesia with the patient or authorized representative who has indicated his/her understanding and acceptance.       Plan Discussed with: CRNA  Anesthesia Plan Comments:         Anesthesia Quick Evaluation

## 2019-03-26 NOTE — H&P (Signed)
All labs reviewed. Abnormal studies sent to patients PCP when indicated.  Previous H&P reviewed, patient examined, there are NO CHANGES.  Patricia Walker Porfilio12/15/20208:16 AM

## 2019-03-26 NOTE — H&P (Signed)
All labs reviewed. Abnormal studies sent to patients PCP when indicated.  Previous H&P reviewed, patient examined, there are NO CHANGES.  Patricia Lavalle Porfilio12/15/20207:26 AM

## 2019-03-26 NOTE — Anesthesia Postprocedure Evaluation (Signed)
Anesthesia Post Note  Patient: Patricia Walker  Procedure(s) Performed: CATARACT EXTRACTION PHACO AND INTRAOCULAR LENS PLACEMENT (IOC) RIGHT 6.45, 00:39.9 (Right Eye)     Patient location during evaluation: PACU Anesthesia Type: MAC Level of consciousness: awake and alert Pain management: pain level controlled Vital Signs Assessment: post-procedure vital signs reviewed and stable Respiratory status: spontaneous breathing, nonlabored ventilation, respiratory function stable and patient connected to nasal cannula oxygen Cardiovascular status: stable and blood pressure returned to baseline Postop Assessment: no apparent nausea or vomiting Anesthetic complications: no    Veda Canning

## 2019-03-26 NOTE — Transfer of Care (Signed)
Immediate Anesthesia Transfer of Care Note  Patient: Patricia Walker  Procedure(s) Performed: CATARACT EXTRACTION PHACO AND INTRAOCULAR LENS PLACEMENT (IOC) RIGHT 6.45, 00:39.9 (Right Eye)  Patient Location: PACU  Anesthesia Type: MAC  Level of Consciousness: awake, alert  and patient cooperative  Airway and Oxygen Therapy: Patient Spontanous Breathing and Patient connected to supplemental oxygen  Post-op Assessment: Post-op Vital signs reviewed, Patient's Cardiovascular Status Stable, Respiratory Function Stable, Patent Airway and No signs of Nausea or vomiting  Post-op Vital Signs: Reviewed and stable  Complications: No apparent anesthesia complications

## 2019-03-26 NOTE — Op Note (Signed)
PREOPERATIVE DIAGNOSIS:  Nuclear sclerotic cataract of the right eye.   POSTOPERATIVE DIAGNOSIS:  H25.11 Cataract   OPERATIVE PROCEDURE:@   SURGEON:  Birder Robson, MD.   ANESTHESIA:  Anesthesiologist: Veda Canning, MD CRNA: Vanetta Shawl, CRNA  1.      Managed anesthesia care. 2.      0.3ml of Shugarcaine was instilled in the eye following the paracentesis.   COMPLICATIONS:  None.   TECHNIQUE:   Stop and chop   DESCRIPTION OF PROCEDURE:  The patient was examined and consented in the preoperative holding area where the aforementioned topical anesthesia was applied to the right eye and then brought back to the Operating Room where the right eye was prepped and draped in the usual sterile ophthalmic fashion and a lid speculum was placed. A paracentesis was created with the side port blade and the anterior chamber was filled with viscoelastic. A near clear corneal incision was performed with the steel keratome. A continuous curvilinear capsulorrhexis was performed with a cystotome followed by the capsulorrhexis forceps. Hydrodissection and hydrodelineation were carried out with BSS on a blunt cannula. The lens was removed in a stop and chop  technique and the remaining cortical material was removed with the irrigation-aspiration handpiece. The capsular bag was inflated with viscoelastic and the Technis [id number]  lens was placed in the capsular bag without complication. The remaining viscoelastic was removed from the eye with the irrigation-aspiration handpiece. The wounds were hydrated. The anterior chamber was flushed with BSS and the eye was inflated to physiologic pressure. 0.34ml of Vigamox was placed in the anterior chamber. The wounds were found to be water tight. The eye was dressed with Combigan. The patient was given protective glasses to wear throughout the day and a shield with which to sleep tonight. The patient was also given drops with which to begin a drop regimen today and will  follow-up with me in one day. Implant Name Type Inv. Item Serial No. Manufacturer Lot No. LRB No. Used Action  LENS IOL DIOP 20.0 - [id number] Intraocular Lens LENS IOL DIOP 20.0 [id number]  Right 1 Implanted   Procedure(s): CATARACT EXTRACTION PHACO AND INTRAOCULAR LENS PLACEMENT (IOC) RIGHT 6.45, 00:39.9 (Right)  Electronically signed: Birder Robson 03/26/2019 8:39 AM

## 2019-03-27 ENCOUNTER — Encounter: Payer: Self-pay | Admitting: *Deleted

## 2019-04-03 ENCOUNTER — Encounter: Payer: Self-pay | Admitting: Ophthalmology

## 2019-04-03 ENCOUNTER — Other Ambulatory Visit: Payer: Self-pay

## 2019-04-10 NOTE — Discharge Instructions (Signed)
Cataract Surgery, Care After °This sheet gives you information about how to care for yourself after your procedure. Your health care provider [place] also give you more specific instructions. If you have problems or questions, contact your health care provider. °What can I expect after the procedure? °After the procedure, it is common to have: °· Itching. °· Discomfort. °· Fluid discharge. °· Sensitivity to light and to touch. °· Bruising in or around the eye. °· Mild blurred vision. °Follow these instructions at home: °Eye care ° °· Do not touch or rub your eyes. °· Protect your eyes as told by your health care provider. You Maddix be told to wear a protective eye shield or sunglasses. °· Do not put a contact lens into the affected eye or eyes until your health care provider approves. °· Keep the area around your eye clean and dry: °? Avoid swimming. °? Do not allow water to hit you directly in the face while showering. °? Keep soap and shampoo out of your eyes. °· Check your eye every day for signs of infection. Watch for: °? Redness, swelling, or pain. °? Fluid, blood, or pus. °? Warmth. °? A bad smell. °? Vision that is getting worse. °? Sensitivity that is getting worse. °Activity °· Do not drive for 24 hours if you were given a sedative during your procedure. °· Avoid strenuous activities, such as playing contact sports, for as long as told by your health care provider. °· Do not drive or use heavy machinery until your health care provider approves. °· Do not bend or lift heavy objects. Bending increases pressure in the eye. You can walk, climb stairs, and do light household chores. °· Ask your health care provider when you can return to work. If you work in a dusty environment, you [place]er[place] be advised to wear protective eyewear for a period of time. °General instructions °· Take or apply over-the-counter and prescription medicines only as told by your health care provider. This includes eye drops. °· Keep all follow-up  visits as told by your health care provider. This is important. °Contact a health care provider if: °· You have increased bruising around your eye. °· You have pain that is not helped with medicine. °· You have a fever. °· You have redness, swelling, or pain in your eye. °· You have fluid, blood, or pus coming from your incision. °· Your vision gets worse. °· Your sensitivity to light gets worse. °Get help right away if: °· You have sudden loss of vision. °· You see flashes of light or spots (floaters). °· You have severe eye pain. °· You develop nausea or vomiting. °Summary °· After your procedure, it is common to have itching, discomfort, bruising, fluid discharge, or sensitivity to light. °· Follow instructions from your health care provider about caring for your eye after the procedure. °· Do not rub your eye after the procedure. You Meas need to wear eye protection or sunglasses. Do not wear contact lenses. Keep the area around your eye clean and dry. °· Avoid activities that require a lot of effort. These include playing sports and lifting heavy objects. °· Contact a health care provider if you have increased bruising, pain that does not go away, or a fever. Get help right away if you suddenly lose your vision, see flashes of light or spots, or have severe pain in the eye. °This information is not intended to replace advice given to you by your health care provider. Make sure you discuss   any questions you have with your health care provider. °Document Released: 10/15/2004 Document Revised: 09/25/2017 Document Reviewed: 09/25/2017 °Elsevier Patient Education © 2020 Elsevier Inc. ° ° °General Anesthesia, Adult, Care After °This sheet gives you information about how to care for yourself after your procedure. Your health care provider Donnan also give you more specific instructions. If you have problems or questions, contact your health care provider. °What can I expect after the procedure? °After the procedure, the  following side effects are common: °· Pain or discomfort at the IV site. °· Nausea. °· Vomiting. °· Sore throat. °· Trouble concentrating. °· Feeling cold or chills. °· Weak or tired. °· Sleepiness and fatigue. °· Soreness and body aches. These side effects can affect parts of the body that were not involved in surgery. °Follow these instructions at home: ° °For at least 24 hours after the procedure: °· Have a responsible adult stay with you. It is important to have someone help care for you until you are awake and alert. °· Rest as needed. °· Do not: °? Participate in activities in which you could fall or become injured. °? Drive. °? Use heavy machinery. °? Drink alcohol. °? Take sleeping pills or medicines that cause drowsiness. °? Make important decisions or sign legal documents. °? Take care of children on your own. °Eating and drinking °· Follow any instructions from your health care provider about eating or drinking restrictions. °· When you feel hungry, start by eating small amounts of foods that are soft and easy to digest (bland), such as toast. Gradually return to your regular diet. °· Drink enough fluid to keep your urine pale yellow. °· If you vomit, rehydrate by drinking water, juice, or clear broth. °General instructions °· If you have sleep apnea, surgery and certain medicines can increase your risk for breathing problems. Follow instructions from your health care provider about wearing your sleep device: °? Anytime you are sleeping, including during daytime naps. °? While taking prescription pain medicines, sleeping medicines, or medicines that make you drowsy. °· Return to your normal activities as told by your health care provider. Ask your health care provider what activities are safe for you. °· Take over-the-counter and prescription medicines only as told by your health care provider. °· If you smoke, do not smoke without supervision. °· Keep all follow-up visits as told by your health care  provider. This is important. °Contact a health care provider if: °· You have nausea or vomiting that does not get better with medicine. °· You cannot eat or drink without vomiting. °· You have pain that does not get better with medicine. °· You are unable to pass urine. °· You develop a skin rash. °· You have a fever. °· You have redness around your IV site that gets worse. °Get help right away if: °· You have difficulty breathing. °· You have chest pain. °· You have blood in your urine or stool, or you vomit blood. °Summary °· After the procedure, it is common to have a sore throat or nausea. It is also common to feel tired. °· Have a responsible adult stay with you for the first 24 hours after general anesthesia. It is important to have someone help care for you until you are awake and alert. °· When you feel hungry, start by eating small amounts of foods that are soft and easy to digest (bland), such as toast. Gradually return to your regular diet. °· Drink enough fluid to keep your urine pale yellow. °·   Return to your normal activities as told by your health care provider. Ask your health care provider what activities are safe for you. °This information is not intended to replace advice given to you by your health care provider. Make sure you discuss any questions you have with your health care provider. °Document Released: 07/04/2000 Document Revised: 03/31/2017 Document Reviewed: 11/11/2016 °Elsevier Patient Education © 2020 Elsevier Inc. ° °

## 2019-04-11 ENCOUNTER — Other Ambulatory Visit: Payer: Self-pay

## 2019-04-11 ENCOUNTER — Other Ambulatory Visit
Admission: RE | Admit: 2019-04-11 | Discharge: 2019-04-11 | Disposition: A | Discharge status: Home / Self Care | Payer: Medicare HMO | Source: Ambulatory Visit | Attending: Ophthalmology | Admitting: Ophthalmology

## 2019-04-11 DIAGNOSIS — Z01812 Encounter for preprocedural laboratory examination: Secondary | ICD-10-CM | POA: Diagnosis present

## 2019-04-11 DIAGNOSIS — Z20828 Contact with and (suspected) exposure to other viral communicable diseases: Secondary | ICD-10-CM | POA: Insufficient documentation

## 2019-04-12 LAB — SARS CORONAVIRUS 2 (TAT 6-24 HRS): SARS Coronavirus 2: NEGATIVE

## 2019-04-16 ENCOUNTER — Ambulatory Visit
Admission: RE | Admit: 2019-04-16 | Discharge: 2019-04-16 | Disposition: A | Discharge status: Home / Self Care | Payer: Medicare HMO | Attending: Ophthalmology | Admitting: Ophthalmology

## 2019-04-16 ENCOUNTER — Encounter: Payer: Self-pay | Admitting: Ophthalmology

## 2019-04-16 ENCOUNTER — Ambulatory Visit: Payer: Medicare HMO | Admitting: Anesthesiology

## 2019-04-16 ENCOUNTER — Other Ambulatory Visit: Payer: Self-pay

## 2019-04-16 ENCOUNTER — Encounter
Admission: RE | Disposition: A | Discharge status: Home / Self Care | Payer: Self-pay | Source: Home / Self Care | Attending: Ophthalmology

## 2019-04-16 DIAGNOSIS — J45909 Unspecified asthma, uncomplicated: Secondary | ICD-10-CM | POA: Insufficient documentation

## 2019-04-16 DIAGNOSIS — Z79899 Other long term (current) drug therapy: Secondary | ICD-10-CM | POA: Diagnosis not present

## 2019-04-16 DIAGNOSIS — Z7951 Long term (current) use of inhaled steroids: Secondary | ICD-10-CM | POA: Insufficient documentation

## 2019-04-16 DIAGNOSIS — Z79890 Hormone replacement therapy (postmenopausal): Secondary | ICD-10-CM | POA: Diagnosis not present

## 2019-04-16 DIAGNOSIS — F419 Anxiety disorder, unspecified: Secondary | ICD-10-CM | POA: Insufficient documentation

## 2019-04-16 DIAGNOSIS — E039 Hypothyroidism, unspecified: Secondary | ICD-10-CM | POA: Insufficient documentation

## 2019-04-16 DIAGNOSIS — F329 Major depressive disorder, single episode, unspecified: Secondary | ICD-10-CM | POA: Insufficient documentation

## 2019-04-16 DIAGNOSIS — M1990 Unspecified osteoarthritis, unspecified site: Secondary | ICD-10-CM | POA: Diagnosis not present

## 2019-04-16 DIAGNOSIS — Z87891 Personal history of nicotine dependence: Secondary | ICD-10-CM | POA: Diagnosis not present

## 2019-04-16 DIAGNOSIS — G4730 Sleep apnea, unspecified: Secondary | ICD-10-CM | POA: Insufficient documentation

## 2019-04-16 DIAGNOSIS — H2512 Age-related nuclear cataract, left eye: Secondary | ICD-10-CM | POA: Insufficient documentation

## 2019-04-16 DIAGNOSIS — K219 Gastro-esophageal reflux disease without esophagitis: Secondary | ICD-10-CM | POA: Insufficient documentation

## 2019-04-16 DIAGNOSIS — Z7901 Long term (current) use of anticoagulants: Secondary | ICD-10-CM | POA: Diagnosis not present

## 2019-04-16 DIAGNOSIS — I10 Essential (primary) hypertension: Secondary | ICD-10-CM | POA: Diagnosis not present

## 2019-04-16 DIAGNOSIS — Z6841 Body Mass Index (BMI) 40.0 and over, adult: Secondary | ICD-10-CM | POA: Insufficient documentation

## 2019-04-16 DIAGNOSIS — I4891 Unspecified atrial fibrillation: Secondary | ICD-10-CM | POA: Insufficient documentation

## 2019-04-16 HISTORY — DX: Cardiac murmur, unspecified: R01.1

## 2019-04-16 HISTORY — DX: Dyspnea, unspecified: R06.00

## 2019-04-16 HISTORY — PX: CATARACT EXTRACTION W/PHACO: SHX586

## 2019-04-16 SURGERY — PHACOEMULSIFICATION, CATARACT, WITH IOL INSERTION
Anesthesia: Monitor Anesthesia Care | Site: Eye | Laterality: Left

## 2019-04-16 MED ORDER — NA CHONDROIT SULF-NA HYALURON 40-17 MG/ML IO SOLN
INTRAOCULAR | Status: DC | PRN
  Administered 2019-04-16: 1 mL via INTRAOCULAR

## 2019-04-16 MED ORDER — MIDAZOLAM HCL 2 MG/2ML IJ SOLN
INTRAMUSCULAR | Status: DC | PRN
  Administered 2019-04-16: 1 mg via INTRAVENOUS

## 2019-04-16 MED ORDER — EPINEPHRINE PF 1 MG/ML IJ SOLN
INTRAOCULAR | Status: DC | PRN
  Administered 2019-04-16: 50 mL via OPHTHALMIC

## 2019-04-16 MED ORDER — FENTANYL CITRATE (PF) 100 MCG/2ML IJ SOLN
INTRAMUSCULAR | Status: DC | PRN
  Administered 2019-04-16: 50 ug via INTRAVENOUS

## 2019-04-16 MED ORDER — BRIMONIDINE TARTRATE-TIMOLOL 0.2-0.5 % OP SOLN
OPHTHALMIC | Status: DC | PRN
  Administered 2019-04-16: 1 [drp] via OPHTHALMIC

## 2019-04-16 MED ORDER — ARMC OPHTHALMIC DILATING DROPS
1.0000 "application " | OPHTHALMIC | Status: DC | PRN
  Administered 2019-04-16 (×3): 1 via OPHTHALMIC

## 2019-04-16 MED ORDER — LIDOCAINE HCL (PF) 2 % IJ SOLN
INTRAOCULAR | Status: DC | PRN
  Administered 2019-04-16: 2 mL

## 2019-04-16 MED ORDER — TETRACAINE HCL 0.5 % OP SOLN
1.0000 [drp] | OPHTHALMIC | Status: DC | PRN
  Administered 2019-04-16 (×3): 1 [drp] via OPHTHALMIC

## 2019-04-16 MED ORDER — MOXIFLOXACIN HCL 0.5 % OP SOLN
OPHTHALMIC | Status: DC | PRN
  Administered 2019-04-16: 0.2 mL via OPHTHALMIC

## 2019-04-16 MED ORDER — ACETAMINOPHEN 325 MG PO TABS: 325.0000 mg | ORAL_TABLET | Freq: Once | ORAL | Status: DC

## 2019-04-16 MED ORDER — ACETAMINOPHEN 160 MG/5ML PO SOLN: 325.0000 mg | Freq: Once | ORAL | Status: DC

## 2019-04-16 SURGICAL SUPPLY — 20 items
CANNULA ANT/CHMB 27G (MISCELLANEOUS) ×2 IMPLANT
CANNULA ANT/CHMB 27GA (MISCELLANEOUS) ×6 IMPLANT
GLOVE SURG LX 8.0 MICRO (GLOVE) ×2
GLOVE SURG LX STRL 8.0 MICRO (GLOVE) ×1 IMPLANT
GLOVE SURG TRIUMPH 8.0 PF LTX (GLOVE) ×3 IMPLANT
GOWN STRL REUS W/ TWL LRG LVL3 (GOWN DISPOSABLE) ×2 IMPLANT
GOWN STRL REUS W/TWL LRG LVL3 (GOWN DISPOSABLE) ×4
LENS IOL TECNIS ITEC 20.0 (Intraocular Lens) ×2 IMPLANT
MARKER SKIN DUAL TIP RULER LAB (MISCELLANEOUS) ×3 IMPLANT
NDL FILTER BLUNT 18X1 1/2 (NEEDLE) ×1 IMPLANT
NDL RETROBULBAR .5 NSTRL (NEEDLE) ×3 IMPLANT
NEEDLE FILTER BLUNT 18X 1/2SAF (NEEDLE) ×2
NEEDLE FILTER BLUNT 18X1 1/2 (NEEDLE) ×1 IMPLANT
PACK EYE AFTER SURG (MISCELLANEOUS) ×3 IMPLANT
PACK OPTHALMIC (MISCELLANEOUS) ×3 IMPLANT
PACK PORFILIO (MISCELLANEOUS) ×3 IMPLANT
SYR 3ML LL SCALE MARK (SYRINGE) ×3 IMPLANT
SYR TB 1ML LUER SLIP (SYRINGE) ×3 IMPLANT
WATER STERILE IRR 250ML POUR (IV SOLUTION) ×3 IMPLANT
WIPE NON LINTING 3.25X3.25 (MISCELLANEOUS) ×3 IMPLANT

## 2019-04-16 NOTE — Transfer of Care (Signed)
Immediate Anesthesia Transfer of Care Note  Patient: Patricia Walker  Procedure(s) Performed: CATARACT EXTRACTION PHACO AND INTRAOCULAR LENS PLACEMENT (IOC) LEFT;   3.14, 00:24.9 (Left Eye)  Patient Location: PACU  Anesthesia Type: MAC  Level of Consciousness: awake, alert  and patient cooperative  Airway and Oxygen Therapy: Patient Spontanous Breathing and Patient connected to supplemental oxygen  Post-op Assessment: Post-op Vital signs reviewed, Patient's Cardiovascular Status Stable, Respiratory Function Stable, Patent Airway and No signs of Nausea or vomiting  Post-op Vital Signs: Reviewed and stable  Complications: No apparent anesthesia complications

## 2019-04-16 NOTE — Anesthesia Preprocedure Evaluation (Addendum)
Anesthesia Evaluation  Patient identified by MRN, date of birth, ID band Patient awake    Reviewed: Allergy & Precautions, H&P , NPO status , Patient's Chart, lab work & pertinent test results  Airway Mallampati: II  TM Distance: >3 FB Neck ROM: full    Dental  (+) Missing   Pulmonary asthma , sleep apnea , former smoker,    Pulmonary exam normal breath sounds clear to auscultation       Cardiovascular hypertension, + CAD (mild nonobstructive) and + Past MI  Normal cardiovascular exam+ dysrhythmias (afib)  Rhythm:Irregular Rate:Normal  HLD   Neuro/Psych  Headaches, Anxiety Depression    GI/Hepatic GERD  ,  Endo/Other  Hypothyroidism Morbid obesity  Renal/GU      Musculoskeletal  (+) Arthritis ,   Abdominal   Peds  Hematology   Anesthesia Other Findings   Reproductive/Obstetrics                            Anesthesia Physical  Anesthesia Plan  ASA: III  Anesthesia Plan: MAC   Post-op Pain Management:    Induction: Intravenous  PONV Risk Score and Plan: 2 and Treatment [place] vary due to age or medical condition, Midazolam and TIVA  Airway Management Planned:   Additional Equipment:   Intra-op Plan:   Post-operative Plan:   Informed Consent: I have reviewed the patients History and Physical, chart, labs and discussed the procedure including the risks, benefits and alternatives for the proposed anesthesia with the patient or authorized representative who has indicated his/her understanding and acceptance.       Plan Discussed with: CRNA  Anesthesia Plan Comments:         Anesthesia Quick Evaluation

## 2019-04-16 NOTE — Anesthesia Procedure Notes (Signed)
Procedure Name: MAC Date/Time: 04/16/2019 9:26 AM Performed by: Georga Bora, CRNA Pre-anesthesia Checklist: Patient identified, Suction available, Emergency Drugs available, Patient being monitored and Timeout performed Patient Re-evaluated:Patient Re-evaluated prior to induction Oxygen Delivery Method: Nasal cannula

## 2019-04-16 NOTE — Op Note (Signed)
PREOPERATIVE DIAGNOSIS:  Nuclear sclerotic cataract of the left eye.   POSTOPERATIVE DIAGNOSIS:  Nuclear sclerotic cataract of the left eye.   OPERATIVE PROCEDURE:@   SURGEON:  Birder Robson, MD.   ANESTHESIA:  Anesthesiologist: Ronelle Nigh, MD CRNA: Mayme Genta, CRNA; Georga Bora, CRNA  1.      Managed anesthesia care. 2.     0.73ml of Shugarcaine was instilled following the paracentesis   COMPLICATIONS:  None.   TECHNIQUE:   Stop and chop   DESCRIPTION OF PROCEDURE:  The patient was examined and consented in the preoperative holding area where the aforementioned topical anesthesia was applied to the left eye and then brought back to the Operating Room where the left eye was prepped and draped in the usual sterile ophthalmic fashion and a lid speculum was placed. A paracentesis was created with the side port blade and the anterior chamber was filled with viscoelastic. A near clear corneal incision was performed with the steel keratome. A continuous curvilinear capsulorrhexis was performed with a cystotome followed by the capsulorrhexis forceps. Hydrodissection and hydrodelineation were carried out with BSS on a blunt cannula. The lens was removed in a stop and chop  technique and the remaining cortical material was removed with the irrigation-aspiration handpiece. The capsular bag was inflated with viscoelastic and the Technis [id number] lens was placed in the capsular bag without complication. The remaining viscoelastic was removed from the eye with the irrigation-aspiration handpiece. The wounds were hydrated. The anterior chamber was flushed with BSS and the eye was inflated to physiologic pressure. 0.90ml Vigamox was placed in the anterior chamber. The wounds were found to be water tight. The eye was dressed with Combigan. The patient was given protective glasses to wear throughout the day and a shield with which to sleep tonight. The patient was also given drops with which to begin a  drop regimen today and will follow-up with me in one day. Implant Name Type Inv. Item Serial No. Manufacturer Lot No. LRB No. Used Action  LENS IOL DIOP 20.0 - [id number] Intraocular Lens LENS IOL DIOP 20.0 [id number]  Left 1 Implanted    Procedure(s) with comments: CATARACT EXTRACTION PHACO AND INTRAOCULAR LENS PLACEMENT (IOC) LEFT;   3.14, 00:24.9 (Left) - sleep apnea-CPAP  Electronically signed: Birder Robson 04/16/2019 9:41 AM

## 2019-04-16 NOTE — Anesthesia Postprocedure Evaluation (Signed)
Anesthesia Post Note  Patient: Patricia Walker  Procedure(s) Performed: CATARACT EXTRACTION PHACO AND INTRAOCULAR LENS PLACEMENT (IOC) LEFT;   3.14, 00:24.9 (Left Eye)     Patient location during evaluation: PACU Anesthesia Type: MAC Level of consciousness: awake and alert and oriented Pain management: satisfactory to patient Vital Signs Assessment: post-procedure vital signs reviewed and stable Respiratory status: spontaneous breathing, nonlabored ventilation and respiratory function stable Cardiovascular status: blood pressure returned to baseline and stable Postop Assessment: Adequate PO intake and No signs of nausea or vomiting Anesthetic complications: no    Raliegh Ip

## 2019-04-16 NOTE — H&P (Signed)
All labs reviewed. Abnormal studies sent to patients PCP when indicated.  Previous H&P reviewed, patient examined, there are NO CHANGES.  Patricia Cudmore Porfilio1/5/20219:14 AM

## 2019-04-17 ENCOUNTER — Encounter: Payer: Self-pay | Admitting: *Deleted

## 2019-06-11 ENCOUNTER — Other Ambulatory Visit: Payer: Self-pay | Admitting: Gastroenterology

## 2019-06-11 DIAGNOSIS — R0789 Other chest pain: Secondary | ICD-10-CM

## 2019-06-11 DIAGNOSIS — K219 Gastro-esophageal reflux disease without esophagitis: Secondary | ICD-10-CM

## 2019-06-11 DIAGNOSIS — R1314 Dysphagia, pharyngoesophageal phase: Secondary | ICD-10-CM

## 2019-06-12 ENCOUNTER — Other Ambulatory Visit: Payer: Self-pay | Admitting: Internal Medicine

## 2019-06-12 DIAGNOSIS — Z1231 Encounter for screening mammogram for malignant neoplasm of breast: Secondary | ICD-10-CM

## 2019-06-17 ENCOUNTER — Ambulatory Visit
Admission: RE | Admit: 2019-06-17 | Discharge: 2019-06-17 | Disposition: A | Discharge status: Home / Self Care | Payer: Medicare HMO | Source: Ambulatory Visit | Attending: Gastroenterology | Admitting: Gastroenterology

## 2019-06-17 ENCOUNTER — Other Ambulatory Visit: Payer: Self-pay

## 2019-06-17 DIAGNOSIS — R0789 Other chest pain: Secondary | ICD-10-CM

## 2019-06-17 DIAGNOSIS — R1314 Dysphagia, pharyngoesophageal phase: Secondary | ICD-10-CM

## 2019-06-17 DIAGNOSIS — K219 Gastro-esophageal reflux disease without esophagitis: Secondary | ICD-10-CM

## 2019-07-03 ENCOUNTER — Ambulatory Visit
Admission: RE | Admit: 2019-07-03 | Discharge: 2019-07-03 | Disposition: A | Discharge status: Home / Self Care | Payer: Medicare HMO | Source: Ambulatory Visit | Attending: Internal Medicine | Admitting: Internal Medicine

## 2019-07-03 DIAGNOSIS — L299 Pruritus, unspecified: Principal | ICD-10-CM

## 2019-07-03 DIAGNOSIS — L409 Psoriasis, unspecified: Principal | ICD-10-CM

## 2019-07-03 DIAGNOSIS — Z1231 Encounter for screening mammogram for malignant neoplasm of breast: Secondary | ICD-10-CM | POA: Diagnosis not present

## 2019-07-03 MED ORDER — TRIAMCINOLONE ACETONIDE 0.1 % TOPICAL OINTMENT: g | 0 refills | 0 days | Status: AC

## 2019-07-03 MED ORDER — TRIAMCINOLONE ACETONIDE 0.1 % TOPICAL OINTMENT
Freq: Two times a day (BID) | TOPICAL | 0 refills | 0.00000 days | Status: CP
Start: 2019-07-03 — End: 2020-07-02

## 2019-08-22 ENCOUNTER — Other Ambulatory Visit: Payer: Self-pay

## 2019-08-22 ENCOUNTER — Encounter: Payer: Self-pay | Admitting: *Deleted

## 2019-08-22 DIAGNOSIS — I10 Essential (primary) hypertension: Secondary | ICD-10-CM | POA: Diagnosis not present

## 2019-08-22 DIAGNOSIS — J45909 Unspecified asthma, uncomplicated: Secondary | ICD-10-CM | POA: Insufficient documentation

## 2019-08-22 DIAGNOSIS — Z7901 Long term (current) use of anticoagulants: Secondary | ICD-10-CM | POA: Diagnosis not present

## 2019-08-22 DIAGNOSIS — M545 Low back pain: Secondary | ICD-10-CM | POA: Diagnosis not present

## 2019-08-22 DIAGNOSIS — R630 Anorexia: Secondary | ICD-10-CM | POA: Insufficient documentation

## 2019-08-22 DIAGNOSIS — Z79899 Other long term (current) drug therapy: Secondary | ICD-10-CM | POA: Diagnosis not present

## 2019-08-22 DIAGNOSIS — E039 Hypothyroidism, unspecified: Secondary | ICD-10-CM | POA: Insufficient documentation

## 2019-08-22 DIAGNOSIS — R110 Nausea: Secondary | ICD-10-CM | POA: Diagnosis not present

## 2019-08-22 DIAGNOSIS — I2510 Atherosclerotic heart disease of native coronary artery without angina pectoris: Secondary | ICD-10-CM | POA: Diagnosis not present

## 2019-08-22 DIAGNOSIS — R1033 Periumbilical pain: Secondary | ICD-10-CM | POA: Insufficient documentation

## 2019-08-22 DIAGNOSIS — Z87891 Personal history of nicotine dependence: Secondary | ICD-10-CM | POA: Insufficient documentation

## 2019-08-22 LAB — URINALYSIS, COMPLETE (UACMP) WITH MICROSCOPIC
Bacteria, UA: NONE SEEN
Bilirubin Urine: NEGATIVE
Glucose, UA: NEGATIVE mg/dL
Hgb urine dipstick: NEGATIVE
Ketones, ur: NEGATIVE mg/dL
Nitrite: NEGATIVE
Protein, ur: NEGATIVE mg/dL
Specific Gravity, Urine: 1.014 (ref 1.005–1.030)
pH: 6 (ref 5.0–8.0)

## 2019-08-22 LAB — CBC
HCT: 44.3 % (ref 36.0–46.0)
Hemoglobin: 14.2 g/dL (ref 12.0–15.0)
MCH: 28.3 pg (ref 26.0–34.0)
MCHC: 32.1 g/dL (ref 30.0–36.0)
MCV: 88.2 fL (ref 80.0–100.0)
Platelets: 372 10*3/uL (ref 150–400)
RBC: 5.02 MIL/uL (ref 3.87–5.11)
RDW: 14.2 % (ref 11.5–15.5)
WBC: 9.1 10*3/uL (ref 4.0–10.5)
nRBC: 0 % (ref 0.0–0.2)

## 2019-08-22 LAB — TROPONIN I (HIGH SENSITIVITY): Troponin I (High Sensitivity): 12 ng/L (ref <18)

## 2019-08-22 LAB — COMPREHENSIVE METABOLIC PANEL
ALT: 15 U/L (ref 0–44)
AST: 17 U/L (ref 15–41)
Albumin: 3.8 g/dL (ref 3.5–5.0)
Alkaline Phosphatase: 80 U/L (ref 38–126)
Anion gap: 9 (ref 5–15)
BUN: 23 mg/dL (ref 8–23)
CO2: 25 mmol/L (ref 22–32)
Calcium: 8.7 mg/dL — ABNORMAL LOW (ref 8.9–10.3)
Chloride: 103 mmol/L (ref 98–111)
Creatinine, Ser: 1 mg/dL (ref 0.44–1.00)
GFR calc Af Amer: >60 mL/min (ref >60)
GFR calc non Af Amer: 56 mL/min — ABNORMAL LOW (ref >60)
Glucose, Bld: 115 mg/dL — ABNORMAL HIGH (ref 70–99)
Potassium: 4.2 mmol/L (ref 3.5–5.1)
Sodium: 137 mmol/L (ref 135–145)
Total Bilirubin: 0.5 mg/dL (ref 0.3–1.2)
Total Protein: 7.1 g/dL (ref 6.5–8.1)

## 2019-08-22 LAB — LIPASE, BLOOD: Lipase: 24 U/L (ref 11–51)

## 2019-08-22 NOTE — ED Triage Notes (Signed)
Pt to ED reporting worsening back pain this morning that has progressed to abd and bilateral pelvic pain. Nausea without vomiting or diarrhea. No fevers. Pt has taken medication for chronic back pain without relief.

## 2019-08-23 ENCOUNTER — Emergency Department
Admission: EM | Admit: 2019-08-23 | Discharge: 2019-08-23 | Disposition: A | Discharge status: Home / Self Care | Payer: Medicare HMO | Attending: Emergency Medicine | Admitting: Emergency Medicine

## 2019-08-23 ENCOUNTER — Emergency Department: Payer: Medicare HMO

## 2019-08-23 DIAGNOSIS — R110 Nausea: Secondary | ICD-10-CM

## 2019-08-23 DIAGNOSIS — M545 Low back pain: Secondary | ICD-10-CM | POA: Diagnosis not present

## 2019-08-23 DIAGNOSIS — M549 Dorsalgia, unspecified: Secondary | ICD-10-CM

## 2019-08-23 MED ORDER — DROPERIDOL 2.5 MG/ML IJ SOLN
2.5000 mg | Freq: Once | INTRAMUSCULAR | Status: AC
  Administered 2019-08-23: 2.5 mg via INTRAVENOUS
  Filled 2019-08-23: qty 2

## 2019-08-23 MED ORDER — IOHEXOL 300 MG/ML  SOLN
100.0000 mL | Freq: Once | INTRAMUSCULAR | Status: AC | PRN
  Administered 2019-08-23: 100 mL via INTRAVENOUS

## 2019-08-23 MED ORDER — ONDANSETRON HCL 4 MG/2ML IJ SOLN
4.0000 mg | INTRAMUSCULAR | Status: AC
  Administered 2019-08-23: 4 mg via INTRAVENOUS
  Filled 2019-08-23: qty 2

## 2019-08-23 MED ORDER — KETOROLAC TROMETHAMINE 30 MG/ML IJ SOLN
15.0000 mg | Freq: Once | INTRAMUSCULAR | Status: AC
  Administered 2019-08-23: 15 mg via INTRAVENOUS
  Filled 2019-08-23: qty 1

## 2019-08-23 MED ORDER — TRAMADOL HCL 50 MG PO TABS: 100.0000 mg | ORAL_TABLET | Freq: Four times a day (QID) | ORAL | 0 refills | Status: DC | PRN

## 2019-08-23 MED ORDER — MORPHINE SULFATE (PF) 4 MG/ML IV SOLN
4.0000 mg | Freq: Once | INTRAVENOUS | Status: AC
  Administered 2019-08-23: 4 mg via INTRAVENOUS
  Filled 2019-08-23: qty 1

## 2019-08-23 NOTE — ED Notes (Signed)
Pt reports pain in her lower back that started yesterday at 7Am and has progressed to pelvic pain. Denies painful urination. Pt reports chronic pain. Pt asks this nurse for pain medication while in the room.

## 2019-08-23 NOTE — ED Provider Notes (Addendum)
[facility] Emergency Department Provider Note  ____________________________________________   First MD Initiated Contact with Patient 08/23/19 [phone]     (approximate)  I have reviewed the triage vital signs and the nursing notes.   HISTORY  Chief Complaint Back Pain and Abdominal Pain    HPI Patricia Walker is a 74 y.o. female with medical history as listed below who presents for evaluation of of acute onset lower back pain that has gradually worsened over the course of nearly 24 hours and is now radiating to the front of her abdomen. She reports that she has chronic back pain but this is worse and different. She states that it started this morning and was hurting when she woke up and was both a sharp and an aching pain. She has difficulty finding position of comfort and moving around makes it worse. She has had nausea and decreased appetite but no vomiting. She denies fever/chills, sore throat, chest pain, acute shortness of breath. She has no history of gallbladder disease or appendicitis. She has no numbness nor weakness in her extremities. She denies dysuria.        Past Medical History:  Diagnosis Date  . Anxiety   . Arthritis   . Asthma   . Coronary artery disease    mild, nonobstructive  . Depression   . Dyspnea    on exertion  . Dysrhythmia    Atrial Fibrillation  . GERD (gastroesophageal reflux disease)   . Heart murmur   . Hyperlipidemia   . Hypertension   . Hypothyroidism   . MI, old 2016   nonobstructive CAD by Cath  . Morbid obesity ([place])   . Persistent atrial fibrillation ([place])   . Pneumonia 06/2018   and RSV  . Psoriasis   . Sleep apnea    compliant with CPAP  . Thyroid disease     Patient Active Problem List   Diagnosis Date Noted  . Pneumonia 06/15/2018  . Multifocal pneumonia 06/14/2018  . Unstable angina ([place]) 06/13/2017  . New onset of headaches 06/12/2017  . Photophobia of right eye 06/12/2017  . Osteoarthritis  of knee 11/17/2016  . Coronary artery disease 09/16/2016  . History of cardioversion 09/16/2016  . Hyperlipidemia, unspecified 09/16/2016  . Hypertension 09/16/2016  . Obesity, unspecified 09/16/2016  . Atrial fibrillation ([place]) 09/08/2016  . Cardiac arrhythmia 09/09/2015  . Gastroesophageal reflux disease 09/09/2015  . Obstructive sleep apnea syndrome 09/09/2015  . Psoriasis 09/09/2015    Past Surgical History:  Procedure Laterality Date  . ARTERY BIOPSY Right 07/21/2017   Procedure: BIOPSY TEMPORAL ARTERY;  Surgeon: [name], MD;  Location: [facility] ORS;  Service: Vascular;  Laterality: Right;  . CARDIAC CATHETERIZATION    . CARDIOVERSION Right 09/01/2016   Procedure: Cardioversion;  Surgeon: [name], MD;  Location: [facility] ORS;  Service: Cardiovascular;  Laterality: Right;  . CARDIOVERSION N/A 09/09/2016   Procedure: Cardioversion;  Surgeon: [name], MD;  Location: [facility] ORS;  Service: Cardiovascular;  Laterality: N/A;  . CATARACT EXTRACTION W/PHACO Right 03/26/2019   Procedure: CATARACT EXTRACTION PHACO AND INTRAOCULAR LENS PLACEMENT (IOC) RIGHT 6.45, 00:39.9;  Surgeon: Birder Robson, MD;  Location: [place];  Service: Ophthalmology;  Laterality: Right;  . CATARACT EXTRACTION W/PHACO Left 04/16/2019   Procedure: CATARACT EXTRACTION PHACO AND INTRAOCULAR LENS PLACEMENT (IOC) LEFT;   3.14, 00:24.9;  Surgeon: Birder Robson, MD;  Location: [place];  Service: Ophthalmology;  Laterality: Left;  sleep apnea-CPAP  . ELECTROPHYSIOLOGIC STUDY  N/A 02/01/2016   Procedure: CARDIOVERSION;  Surgeon: [name], MD;  Location: [facility] ORS;  Service: Cardiovascular;  Laterality: N/A;  . ESOPHAGEAL DILATION    . ESOPHAGOGASTRODUODENOSCOPY (EGD) WITH PROPOFOL N/A 02/06/2017   Procedure: ESOPHAGOGASTRODUODENOSCOPY (EGD) WITH PROPOFOL;  Surgeon: [name], MD;  Location: [facility] ENDOSCOPY;  Service: Gastroenterology;  Laterality: N/A;  . EUS N/A 02/16/2017    Procedure: FULL UPPER ENDOSCOPIC ULTRASOUND (EUS) RADIAL;  Surgeon: [name], MD;  Location: [facility] ENDOSCOPY;  Service: Gastroenterology;  Laterality: N/A;  . LEFT HEART CATH AND CORONARY ANGIOGRAPHY N/A 09/08/2016   Procedure: Left Heart Cath and Coronary Angiography;  Surgeon: [name], MD;  Location: [place] CV LAB;  Service: Cardiovascular;  Laterality: N/A;  . US ECHOCARDIOGRAPHY      Prior to Admission medications   Medication Sig Start Date End Date Taking? Authorizing Provider  albuterol (PROVENTIL HFA;VENTOLIN HFA) 108 (90 Base) MCG/ACT inhaler Inhale 2 puffs into the lungs every 6 (six) hours as needed for wheezing or shortness of breath.    [provider]  apixaban (ELIQUIS) 5 MG TABS tablet Take 5 mg by mouth 2 (two) times daily.    [provider]  atorvastatin (LIPITOR) 20 MG tablet Take 20 mg by mouth every evening. 08/25/16   [provider]  azelastine (ASTELIN) 0.1 % nasal spray Place 2 sprays into both nostrils 2 (two) times daily as needed for rhinitis or allergies.  10/13/16   [provider]  baclofen (LIORESAL) 10 MG tablet Take 10 mg by mouth 2 (two) times daily. 06/13/17   [provider]  benzonatate (TESSALON) 100 MG capsule Take 1 capsule (100 mg total) by mouth 3 (three) times daily as needed for cough. Patient not taking: Reported on 03/20/2019 06/18/18   Hillary Bow, MD  budesonide-formoterol [place]) 160-4.5 MCG/ACT inhaler Inhale 2 puffs into the lungs 2 (two) times daily.     [provider]  doxepin (SINEQUAN) 25 MG capsule Take 25 mg by mouth at bedtime.    [provider]  esomeprazole (NEXIUM) 40 MG capsule Take 40 mg by mouth daily. 07/01/17   [provider]  folic acid (FOLVITE) 1 MG tablet Take 1 mg by mouth daily.    [provider]  furosemide (LASIX) 20 MG tablet Take 20 mg by mouth daily as needed for fluid. 08/24/16   [provider]  gabapentin  (NEURONTIN) 300 MG capsule Take 300 mg by mouth 2 (two) times daily.    [provider]  levofloxacin (LEVAQUIN) 500 MG tablet Take 1 tablet (500 mg total) by mouth daily. Patient not taking: Reported on 03/20/2019 06/18/18   Hillary Bow, MD  levothyroxine (SYNTHROID, LEVOTHROID) 50 MCG tablet Take 50 mcg by mouth daily before breakfast.    [provider]  methotrexate (RHEUMATREX) 2.5 MG tablet Take 10 mg by mouth once a week. Caution:Chemotherapy. Protect from light.    [provider]  montelukast (SINGULAIR) 10 MG tablet Take 10 mg by mouth at bedtime.  05/26/17   [provider]  PARoxetine (PAXIL) 20 MG tablet Take 20 mg by mouth daily.    [provider]  predniSONE (STERAPRED UNI-PAK 21 TAB) 10 MG (21) TBPK tablet 6 tabs day 1 and taper 10 mg a day - 6 days Patient not taking: Reported on 03/20/2019 06/18/18   Hillary Bow, MD  predniSONE (STERAPRED UNI-PAK 21 TAB) 10 MG (21) TBPK tablet Per packaging instructions Patient not taking: Reported on 03/20/2019 06/25/18  Nance Pear, MD  pregabalin (LYRICA) 50 MG capsule Take 50 mg by mouth daily as needed for pain.    [provider]  ranitidine (ZANTAC) 150 MG tablet Take 150 mg by mouth 2 (two) times daily. 04/27/17   [provider]  SKYRIZI, 150 MG DOSE, 75 MG/0.83ML PSKT Inject 1 Dose into the skin every 3 (three) months. 05/09/18   [provider]  traMADol (ULTRAM) 50 MG tablet Take 2 tablets (100 mg total) by mouth every 6 (six) hours as needed for moderate pain or severe pain. 08/23/19   Hinda Kehr, MD  triamcinolone ointment (KENALOG) 0.1 % Apply 1 application topically as directed. 05/09/18   [provider]    Allergies Codeine and Other  Family History  Problem Relation Age of Onset  . Breast cancer Paternal Aunt   . Other Father   . Heart attack Father     Social History Social History   Tobacco Use  . Smoking status: Former Smoker     Types: Cigarettes    Quit date: 02/01/2013    Years since quitting: 6.5  . Smokeless tobacco: Never Used  Substance Use Topics  . Alcohol use: No  . Drug use: No    Review of Systems Constitutional: No fever/chills Eyes: No visual changes. ENT: No sore throat. Cardiovascular: Denies chest pain. Respiratory: Denies shortness of breath. Gastrointestinal: Abdominal pain that seems to be radiating either from or to her lower back. Nausea, no vomiting. Decreased appetite. Genitourinary: Negative for dysuria. Musculoskeletal: Low back pain radiating to her sides in the front of her abdomen. Integumentary: Negative for rash. Neurological: Negative for headaches, focal weakness or numbness.   ____________________________________________   PHYSICAL EXAM:  VITAL SIGNS: ED Triage Vitals  Enc Vitals Group     BP 08/22/19 2139 115/75     Pulse Rate 08/22/19 2139 73     Resp 08/22/19 2139 16     Temp 08/22/19 2139 98.2 F (36.8 C)     Temp Source 08/22/19 2139 Oral     SpO2 08/22/19 2139 97 %     Weight 08/22/19 2140 131.5 kg (290 lb)     Height 08/22/19 2140 1.626 m (5\' 4" )     Head Circumference --      Peak Flow --      Pain Score 08/22/19 2140 9     Pain Loc --      Pain Edu? --      Excl. in [place]? --     Constitutional: Alert and oriented. Appears uncomfortable. Eyes: Conjunctivae are normal.  Head: Atraumatic. Nose: No congestion/rhinnorhea. Mouth/Throat: Patient is wearing a mask. Neck: No stridor.  No meningeal signs.   Cardiovascular: Normal rate, regular rhythm. Good peripheral circulation. Grossly normal heart sounds. Respiratory: Normal respiratory effort.  No retractions. Gastrointestinal: Morbid obesity. Soft and nondistended. Tenderness to palpation but difficult to localize specifically where she is tender due to body habitus. She seems to be most tender in the supraumbilical region. Musculoskeletal: No lower extremity tenderness nor edema. No gross deformities  of extremities. Neurologic:  Normal speech and language. No gross focal neurologic deficits are appreciated.  Skin:  Skin is warm, dry and intact. Psychiatric: Mood and affect are normal. Speech and behavior are normal.  ____________________________________________   LABS (all labs ordered are listed, but only abnormal results are displayed)  Labs Reviewed  COMPREHENSIVE METABOLIC PANEL - Abnormal; Notable for the following components:      Result Value   Glucose,  Bld 115 (*)    Calcium 8.7 (*)    GFR calc non Af Amer 56 (*)    All other components within normal limits  URINALYSIS, COMPLETE (UACMP) WITH MICROSCOPIC - Abnormal; Notable for the following components:   Color, Urine YELLOW (*)    APPearance CLEAR (*)    Leukocytes,Ua TRACE (*)    All other components within normal limits  URINE CULTURE  LIPASE, BLOOD  CBC  TROPONIN I (HIGH SENSITIVITY)  TROPONIN I (HIGH SENSITIVITY)   ____________________________________________  EKG  ED ECG REPORT I, Hinda Kehr, the attending physician, personally viewed and interpreted this ECG.  Date: 08/22/2019 EKG Time: 21: 58 Rate: 62 Rhythm: Atrial fibrillation QRS Axis: normal Intervals: normal ST/T Wave abnormalities: Non-specific ST segment / T-wave changes, but no clear evidence of acute ischemia. Narrative Interpretation: no definitive evidence of acute ischemia; does not meet STEMI criteria.   ____________________________________________  RADIOLOGY I, Hinda Kehr, personally viewed and evaluated these images (plain radiographs) as part of my medical decision making, as well as reviewing the written report by the radiologist.  ED MD interpretation:  No acute abnormalities identified, no clear cause of pain.  Official radiology report(s): CT ABDOMEN PELVIS W CONTRAST  Result Date: 08/23/2019 CLINICAL DATA:  Abdominal and back pain. Nausea. EXAM: CT ABDOMEN AND PELVIS WITH CONTRAST TECHNIQUE: Multidetector CT imaging of  the abdomen and pelvis was performed using the standard protocol following bolus administration of intravenous contrast. CONTRAST:  123mL OMNIPAQUE IOHEXOL 300 MG/ML  SOLN COMPARISON:  CT abdomen pelvis dated December 25, 2017. FINDINGS: Lower chest: No acute abnormality. Hepatobiliary: No focal liver abnormality is seen. Unchanged hepatic steatosis. No gallstones, gallbladder wall thickening, or biliary dilatation. Pancreas: Unremarkable. No pancreatic ductal dilatation or surrounding inflammatory changes. Spleen: Normal in size without focal abnormality. Adrenals/Urinary Tract: Unchanged 2.2 cm left adrenal adenoma. The right adrenal gland is unremarkable. Unchanged subcentimeter bilateral renal cysts. No renal calculi or hydronephrosis. Unchanged small left renovascular calcification. The bladder is unremarkable. Stomach/Bowel: Unchanged small hiatal hernia. The stomach is otherwise within normal limits. No bowel wall thickening, distention, or surrounding inflammatory changes. Normal appendix. Vascular/Lymphatic: Aortic atherosclerosis. No enlarged abdominal or pelvic lymph nodes. Reproductive: Uterus and bilateral adnexa are unremarkable. Other: No abdominal wall hernia or abnormality. No abdominopelvic ascites. No pneumoperitoneum. Musculoskeletal: No acute or significant osseous findings. IMPRESSION: 1. No acute intra-abdominal process. 2. Unchanged hepatic steatosis. 3. Unchanged left adrenal adenoma. 4. Aortic Atherosclerosis (ICD10-I70.0). Electronically Signed   By: Titus Dubin M.D.   On: 08/23/2019 05:24   CT L-SPINE NO CHARGE  Result Date: 08/23/2019 CLINICAL DATA:  Worsening back pain this morning EXAM: CT lumbar spine with contrast TECHNIQUE: Multiplanar CT images of the lumbar spine were reconstructed from contemporary CT of the abdomen and pelvis. CONTRAST:  None additional COMPARISON:  Lumbar radiography 02/26/2018 FINDINGS: Alignment: Transitional S1 vertebra based on the lowest ribs.  Vertebrae: No acute fracture or focal pathologic process. Paraspinal and other soft tissues: Reported separately. No acute paravertebral finding. Disc levels: L1-L2: Spondylosis and mild facet spurring. No impingement L2-L3: Slight retrolisthesis. Spondylosis with disc narrowing and mild bulging. L3-L4: Mild facet spurring. L4-L5: Greatest level of disc narrowing with endplate degeneration and circumferential endplate ridging. Mild facet spurring. Mild spinal stenosis. L5-S1:Facet osteoarthritis which is asymmetrically advanced on the right where there is bulky spurring. Negative disc space and no impingement. S1-2: Pseudoarticulation of the left transverse process. No neural impingement. IMPRESSION: 1. No acute finding. 2. Disc and facet degeneration as described  above. Electronically Signed   By: Monte Fantasia M.D.   On: 08/23/2019 06:27    ____________________________________________   PROCEDURES   Procedure(s) performed (including Critical Care):  Procedures   ____________________________________________   INITIAL IMPRESSION / MDM / [place] / ED COURSE  As part of my medical decision making, I reviewed the following data within the [place] History obtained from family, Nursing notes reviewed and incorporated, Labs reviewed , EKG interpreted , Old chart reviewed, Patient signed out to Dr. Jimmye Norman, Notes from prior ED visits and [place] Controlled Substance Database   Differential diagnosis includes, but is not limited to, diverticulitis, gallbladder disease, appendicitis, AAS, acute on chronic back pain or other musculoskeletal pain/strain.  Patient's vital signs are reassuring. She is on Eliquis for chronic atrial fibrillation. She is not tachycardic in spite of nearly 7 hours of waiting in the lobby for bed due to overwhelming ED patient volumes. Comprehensive metabolic panel is normal, CBC is normal with no leukocytosis, lipase is normal, initial  high-sensitivity troponin is 12 and the patient has no ischemic changes and no chest pain.  Urinalysis shows 11-20 WBCs but she is not having any dysuria and this likely does not represent an acute UTI but I will send a urine culture.  Plan: IV, morphine 4 mg IV, Zofran 4 mg IV, CT scan with IV contrast. Reassess.       Clinical Course as of [place] 14 0721  Fri [place] 14, 2021  0630 No acute findings, no suggestion of cord impingement.  CT L-SPINE NO CHARGE [CF]  0631 The patient is still reporting a great deal of back pain radiating to the front as well as persistent nausea.  I updated her and her sister who is now at bedside regarding the reassuring results of her work-up.  She is still very concerned about the pain and the nausea.  Her images did not demonstrate any acute or emergent abnormalities including normal gallbladder, normal appendix, no evidence of diverticulitis, no obvious aortic abnormalities, and nonacute L-spine.I will try to improve her pain with Toradol 15 mg IV and her nausea with droperidol 2.5 mg IV.  I think this [place] also be helpful for the acute on chronic nature of the back pain.  I discussed the plan for discharge as her pain can be adequately controlled, even if not completely resolved, and the patient and her sister seem to understand the plan.  She has no gross neurological abnormalities that would necessitate advanced imaging such as an MRI.  I will plan to discharge the patient if she is feeling better so that she can get 6 no to clinic for a knee injection as was previously planned for later this morning (around 9:15 AM).   [CF]  0710 Transferred ED care to Dr. Jimmye Norman to reassess appropriateness for discharge after mediation has a change to work.   [CF]  0720 Patient now feeling better and she and her sister are comfortable going home.  Will discharge as planned.   [CF]    Clinical Course User Index [CF] Hinda Kehr, MD      ____________________________________________  FINAL CLINICAL IMPRESSION(S) / ED DIAGNOSES  Final diagnoses:  Back pain  Nausea     MEDICATIONS GIVEN DURING THIS VISIT:  Medications  morphine 4 MG/ML injection 4 mg (4 mg Intravenous Given 08/23/19 0422)  ondansetron (ZOFRAN) injection 4 mg (4 mg Intravenous Given 08/23/19 0422)  iohexol (OMNIPAQUE) 300 MG/ML solution 100 mL (100 mLs Intravenous Contrast  Given 08/23/19 0420)  ketorolac (TORADOL) 30 MG/ML injection 15 mg (15 mg Intravenous Given 08/23/19 0648)  droperidol (INAPSINE) 2.5 MG/ML injection 2.5 mg (2.5 mg Intravenous Given 08/23/19 0648)     ED Discharge Orders         Ordered    traMADol (ULTRAM) 50 MG tablet  Every 6 hours PRN     08/23/19 0636          *Please note:  Michiye Lisboa Slifer was evaluated in Emergency Department on 08/23/2019 for the symptoms described in the history of present illness. She was evaluated in the context of the global COVID-19 pandemic, which necessitated consideration that the patient might be at risk for infection with the SARS-CoV-2 virus that causes COVID-19. Institutional protocols and algorithms that pertain to the evaluation of patients at risk for COVID-19 are in a state of rapid change based on information released by regulatory bodies including the CDC and federal and state organizations. These policies and algorithms were followed during the patient's care in the ED.  Some ED evaluations and interventions [place] be delayed as a result of limited staffing during the pandemic.*  Note:  This document was prepared using Dragon voice recognition software and [place] include unintentional dictation errors.   Hinda Kehr, MD 08/23/19 [id number]    Hinda Kehr, MD 08/23/19 [phone]

## 2019-08-23 NOTE — Discharge Instructions (Addendum)
Your workup in the Emergency Department today was reassuring.  We did not find any specific abnormalities.  We recommend you drink plenty of fluids, take your regular medications and/or any new ones prescribed today, and follow up with the doctor(s) listed in these documents as recommended.  Return to the Emergency Department if you develop new or worsening symptoms that concern you.  

## 2019-08-23 NOTE — ED Notes (Signed)
Pt to CT

## 2019-08-23 NOTE — ED Notes (Signed)
Patient on stretcher.  Sister says patient is getting better and they are wanting to go home soon.

## 2019-08-24 LAB — URINE CULTURE
Culture: <10000 — AB
Special Requests: NORMAL

## 2019-08-26 ENCOUNTER — Other Ambulatory Visit
Admission: RE | Admit: 2019-08-26 | Discharge: 2019-08-26 | Disposition: A | Discharge status: Home / Self Care | Payer: Medicare HMO | Source: Ambulatory Visit | Attending: Internal Medicine | Admitting: Internal Medicine

## 2019-08-26 ENCOUNTER — Other Ambulatory Visit: Payer: Self-pay

## 2019-08-26 DIAGNOSIS — Z01812 Encounter for preprocedural laboratory examination: Secondary | ICD-10-CM | POA: Diagnosis present

## 2019-08-26 DIAGNOSIS — Z20822 Contact with and (suspected) exposure to covid-19: Secondary | ICD-10-CM | POA: Insufficient documentation

## 2019-08-26 LAB — SARS CORONAVIRUS 2 (TAT 6-24 HRS): SARS Coronavirus 2: NEGATIVE

## 2019-08-27 ENCOUNTER — Encounter: Payer: Self-pay | Admitting: Internal Medicine

## 2019-08-28 ENCOUNTER — Ambulatory Visit: Payer: Medicare HMO | Admitting: Anesthesiology

## 2019-08-28 ENCOUNTER — Ambulatory Visit
Admission: RE | Admit: 2019-08-28 | Discharge: 2019-08-28 | Disposition: A | Discharge status: Home / Self Care | Payer: Medicare HMO | Attending: Internal Medicine | Admitting: Internal Medicine

## 2019-08-28 ENCOUNTER — Encounter
Admission: RE | Disposition: A | Discharge status: Home / Self Care | Payer: Self-pay | Source: Home / Self Care | Attending: Internal Medicine

## 2019-08-28 ENCOUNTER — Other Ambulatory Visit: Payer: Self-pay

## 2019-08-28 DIAGNOSIS — K5730 Diverticulosis of large intestine without perforation or abscess without bleeding: Secondary | ICD-10-CM | POA: Insufficient documentation

## 2019-08-28 DIAGNOSIS — Z86010 Personal history of colonic polyps: Secondary | ICD-10-CM | POA: Diagnosis not present

## 2019-08-28 DIAGNOSIS — Z79899 Other long term (current) drug therapy: Secondary | ICD-10-CM | POA: Diagnosis not present

## 2019-08-28 DIAGNOSIS — Z7901 Long term (current) use of anticoagulants: Secondary | ICD-10-CM | POA: Insufficient documentation

## 2019-08-28 DIAGNOSIS — I252 Old myocardial infarction: Secondary | ICD-10-CM | POA: Insufficient documentation

## 2019-08-28 DIAGNOSIS — I4891 Unspecified atrial fibrillation: Secondary | ICD-10-CM | POA: Insufficient documentation

## 2019-08-28 DIAGNOSIS — M1990 Unspecified osteoarthritis, unspecified site: Secondary | ICD-10-CM | POA: Diagnosis not present

## 2019-08-28 DIAGNOSIS — Z1211 Encounter for screening for malignant neoplasm of colon: Secondary | ICD-10-CM | POA: Insufficient documentation

## 2019-08-28 DIAGNOSIS — I2510 Atherosclerotic heart disease of native coronary artery without angina pectoris: Secondary | ICD-10-CM | POA: Insufficient documentation

## 2019-08-28 DIAGNOSIS — Z885 Allergy status to narcotic agent status: Secondary | ICD-10-CM | POA: Insufficient documentation

## 2019-08-28 DIAGNOSIS — K449 Diaphragmatic hernia without obstruction or gangrene: Secondary | ICD-10-CM | POA: Diagnosis not present

## 2019-08-28 DIAGNOSIS — D123 Benign neoplasm of transverse colon: Secondary | ICD-10-CM | POA: Diagnosis not present

## 2019-08-28 DIAGNOSIS — R1310 Dysphagia, unspecified: Secondary | ICD-10-CM | POA: Diagnosis present

## 2019-08-28 DIAGNOSIS — G4730 Sleep apnea, unspecified: Secondary | ICD-10-CM | POA: Insufficient documentation

## 2019-08-28 DIAGNOSIS — D125 Benign neoplasm of sigmoid colon: Secondary | ICD-10-CM | POA: Insufficient documentation

## 2019-08-28 DIAGNOSIS — K222 Esophageal obstruction: Secondary | ICD-10-CM | POA: Diagnosis not present

## 2019-08-28 DIAGNOSIS — K2100 Gastro-esophageal reflux disease with esophagitis, without bleeding: Secondary | ICD-10-CM | POA: Insufficient documentation

## 2019-08-28 DIAGNOSIS — J45909 Unspecified asthma, uncomplicated: Secondary | ICD-10-CM | POA: Diagnosis not present

## 2019-08-28 DIAGNOSIS — E039 Hypothyroidism, unspecified: Secondary | ICD-10-CM | POA: Diagnosis not present

## 2019-08-28 DIAGNOSIS — E785 Hyperlipidemia, unspecified: Secondary | ICD-10-CM | POA: Insufficient documentation

## 2019-08-28 DIAGNOSIS — Z6841 Body Mass Index (BMI) 40.0 and over, adult: Secondary | ICD-10-CM | POA: Insufficient documentation

## 2019-08-28 DIAGNOSIS — Z7952 Long term (current) use of systemic steroids: Secondary | ICD-10-CM | POA: Insufficient documentation

## 2019-08-28 DIAGNOSIS — Z79890 Hormone replacement therapy (postmenopausal): Secondary | ICD-10-CM | POA: Diagnosis not present

## 2019-08-28 DIAGNOSIS — Z7951 Long term (current) use of inhaled steroids: Secondary | ICD-10-CM | POA: Diagnosis not present

## 2019-08-28 DIAGNOSIS — I10 Essential (primary) hypertension: Secondary | ICD-10-CM | POA: Diagnosis not present

## 2019-08-28 DIAGNOSIS — K640 First degree hemorrhoids: Secondary | ICD-10-CM | POA: Insufficient documentation

## 2019-08-28 HISTORY — PX: ESOPHAGOGASTRODUODENOSCOPY (EGD) WITH PROPOFOL: SHX5813

## 2019-08-28 HISTORY — PX: COLONOSCOPY WITH PROPOFOL: SHX5780

## 2019-08-28 SURGERY — COLONOSCOPY WITH PROPOFOL
Anesthesia: General

## 2019-08-28 MED ORDER — PROPOFOL 500 MG/50ML IV EMUL
INTRAVENOUS | Status: AC
  Filled 2019-08-28: qty 50

## 2019-08-28 MED ORDER — PROPOFOL 10 MG/ML IV BOLUS
INTRAVENOUS | Status: AC
  Filled 2019-08-28: qty 20

## 2019-08-28 MED ORDER — PROPOFOL 500 MG/50ML IV EMUL
INTRAVENOUS | Status: DC | PRN
  Administered 2019-08-28: 120 ug/kg/min via INTRAVENOUS

## 2019-08-28 MED ORDER — SODIUM CHLORIDE 0.9 % IV SOLN
INTRAVENOUS | Status: DC
  Administered 2019-08-28: 1000 mL via INTRAVENOUS

## 2019-08-28 NOTE — Op Note (Signed)
[facility] Gastroenterology Patient Name: Patricia Walker Procedure Date: 08/28/2019 9:24 AM MRN: [id number] Account #: [id number] Date of Birth: May 30, 1945 Admit Type: Outpatient Age: 74 Room: [facility] ENDO ROOM 3 Gender: Female Note Status: Finalized Procedure:             Colonoscopy Indications:           High risk colon cancer surveillance: Personal history                         of colonic polyps Providers:             Benay Pike. Everest Hacking MD, MD Medicines:             Propofol per Anesthesia Complications:         No immediate complications. Procedure:             Pre-Anesthesia Assessment:                        - The risks and benefits of the procedure and the                         sedation options and risks were discussed with the                         patient. All questions were answered and informed                         consent was obtained.                        - Patient identification and proposed procedure were                         verified prior to the procedure by the nurse. The                         procedure was verified in the procedure room.                        - ASA Grade Assessment: III - A patient with severe                         systemic disease.                        - After reviewing the risks and benefits, the patient                         was deemed in satisfactory condition to undergo the                         procedure.                        After obtaining informed consent, the colonoscope was                         passed under direct vision. Throughout the procedure,  the patient's blood pressure, pulse, and oxygen                         saturations were monitored continuously. The                         Colonoscope was introduced through the anus and                         advanced to the the cecum, identified by appendiceal                         orifice and ileocecal valve. The  colonoscopy was                         performed without difficulty. The patient tolerated                         the procedure well. The quality of the bowel                         preparation was excellent. The ileocecal valve,                         appendiceal orifice, and rectum were photographed. Findings:      The perianal and digital rectal examinations were normal. Pertinent       negatives include normal sphincter tone and no palpable rectal lesions.      Non-bleeding internal hemorrhoids were found during retroflexion. The       hemorrhoids were Grade I (internal hemorrhoids that do not prolapse).      Many small and large-mouthed diverticula were found in the sigmoid colon.      Two sessile polyps were found in the sigmoid colon and transverse colon.       The polyps were 3 to 5 mm in size. These polyps were removed with a cold       biopsy forceps. Resection and retrieval were complete.      The exam was otherwise without abnormality. Impression:            - Non-bleeding internal hemorrhoids.                        - Diverticulosis in the sigmoid colon.                        - Two 3 to 5 mm polyps in the sigmoid colon and in the                         transverse colon, removed with a cold biopsy forceps.                         Resected and retrieved.                        - The examination was otherwise normal. Recommendation:        - Await pathology results from EGD, also performed  today.                        - Monitor results to esophageal dilation                        - Patient has a contact number available for                         emergencies. The signs and symptoms of potential                         delayed complications were discussed with the patient.                         Return to normal activities tomorrow. Written                         discharge instructions were provided to the patient.                        -  Resume previous diet.                        - Continue present medications.                        - Resume Eliquis (apixaban) at prior dose today. Refer                         to managing physician for further adjustment of                         therapy.                        - Await pathology results.                        - Repeat colonoscopy is recommended for surveillance.                         The colonoscopy date will be determined after                         pathology results from today's exam become available                         for review.                        - Return to physician assistant in 6 weeks.                        - Follow up with Octavia Bruckner, PA-C in [ ]  months. Procedure Code(s):     --- Professional ---                        [phone], Colonoscopy, flexible; with biopsy, single or  multiple Diagnosis Code(s):     --- Professional ---                        K57.30, Diverticulosis of large intestine without                         perforation or abscess without bleeding                        K63.5, Polyp of colon                        K64.0, First degree hemorrhoids                        Z86.010, Personal history of colonic polyps CPT copyright 2019 American Medical Association. All rights reserved. The codes documented in this report are preliminary and upon coder review [place]  be revised to meet current compliance requirements. Efrain Sella MD, MD 08/28/2019 9:54:02 AM This report has been signed electronically. Number of Addenda: 0 Note Initiated On: 08/28/2019 9:24 AM Scope Withdrawal Time: 0 hours 5 minutes 59 seconds  Total Procedure Duration: 0 hours 7 minutes 53 seconds  Estimated Blood Loss:  Estimated blood loss: none.      [facility]

## 2019-08-28 NOTE — Transfer of Care (Signed)
Immediate Anesthesia Transfer of Care Note  Patient: Patricia Walker  Procedure(s) Performed: COLONOSCOPY WITH PROPOFOL (N/A ) ESOPHAGOGASTRODUODENOSCOPY (EGD) WITH PROPOFOL (N/A )  Patient Location: PACU and Endoscopy Unit  Anesthesia Type:General  Level of Consciousness: sedated  Airway & Oxygen Therapy: Patient Spontanous Breathing and Patient connected to face mask  Post-op Assessment: Report given to RN  Post vital signs: stable  Last Vitals:  Vitals Value Taken Time  BP    Temp    Pulse    Resp    SpO2      Last Pain:  Vitals:   08/28/19 0834  TempSrc: Temporal  PainSc: 0-No pain         Complications: No apparent anesthesia complications

## 2019-08-28 NOTE — H&P (Signed)
Outpatient short stay form Pre-procedure 08/28/2019 8:56 AM Patricia Walker K. Patricia Walker, M.D.  Primary Physician: Lamonte Sakai, M.D.  Reason for visit:  GERD, dysphagia, change in bowel habits, personal hx of colon polyps  History of present illness:  74 y/o female presents with GERD incompletely responsive to esomeprazole, dysphagia of solids and liquids to an area 2cm above the sternal notch, 2 loose stools daily without known bleeding, and a personal hx of colon polyps. No abnormal weight loss or significant anorexia.     Current Facility-Administered Medications:  .  0.9 %  sodium chloride infusion, , Intravenous, Continuous, [place], Benay Pike, MD, Last Rate: 20 mL/hr at 08/28/19 0847, 1,000 mL at 08/28/19 0847  Medications Prior to Admission  Medication Sig Dispense Refill Last Dose  . albuterol (PROVENTIL HFA;VENTOLIN HFA) 108 (90 Base) MCG/ACT inhaler Inhale 2 puffs into the lungs every 6 (six) hours as needed for wheezing or shortness of breath.   Past Week at Unknown time  . apixaban (ELIQUIS) 5 MG TABS tablet Take 5 mg by mouth 2 (two) times daily.   Past Week at Unknown time  . atorvastatin (LIPITOR) 20 MG tablet Take 20 mg by mouth every evening.  1 Past Week at Unknown time  . azelastine (ASTELIN) 0.1 % nasal spray Place 2 sprays into both nostrils 2 (two) times daily as needed for rhinitis or allergies.    Past Week at Unknown time  . baclofen (LIORESAL) 10 MG tablet Take 10 mg by mouth 2 (two) times daily.   Past Week at Unknown time  . benzonatate (TESSALON) 100 MG capsule Take 1 capsule (100 mg total) by mouth 3 (three) times daily as needed for cough. 20 capsule 0 Past Week at Unknown time  . budesonide-formoterol (SYMBICORT) 160-4.5 MCG/ACT inhaler Inhale 2 puffs into the lungs 2 (two) times daily.    Past Week at Unknown time  . doxepin (SINEQUAN) 25 MG capsule Take 25 mg by mouth at bedtime.   Past Week at Unknown time  . esomeprazole (NEXIUM) 40 MG capsule Take 40 mg by mouth  daily.  1 Past Week at Unknown time  . folic acid (FOLVITE) 1 MG tablet Take 1 mg by mouth daily.   Past Week at Unknown time  . furosemide (LASIX) 20 MG tablet Take 20 mg by mouth daily as needed for fluid.  2 Past Week at Unknown time  . gabapentin (NEURONTIN) 300 MG capsule Take 300 mg by mouth 2 (two) times daily.   Past Week at Unknown time  . levofloxacin (LEVAQUIN) 500 MG tablet Take 1 tablet (500 mg total) by mouth daily. 5 tablet 0 Past Week at Unknown time  . levothyroxine (SYNTHROID, LEVOTHROID) 50 MCG tablet Take 50 mcg by mouth daily before breakfast.   Past Week at Unknown time  . methotrexate (RHEUMATREX) 2.5 MG tablet Take 10 mg by mouth once a week. Caution:Chemotherapy. Protect from light.   Past Week at Unknown time  . montelukast (SINGULAIR) 10 MG tablet Take 10 mg by mouth at bedtime.   6 Past Week at Unknown time  . PARoxetine (PAXIL) 20 MG tablet Take 20 mg by mouth daily.   Past Week at Unknown time  . predniSONE (STERAPRED UNI-PAK 21 TAB) 10 MG (21) TBPK tablet 6 tabs day 1 and taper 10 mg a day - 6 days 21 tablet 0 Past Week at Unknown time  . predniSONE (STERAPRED UNI-PAK 21 TAB) 10 MG (21) TBPK tablet Per packaging instructions 21 tablet 0 Past  Week at Unknown time  . pregabalin (LYRICA) 50 MG capsule Take 50 mg by mouth daily as needed for pain.   Past Week at Unknown time  . ranitidine (ZANTAC) 150 MG tablet Take 150 mg by mouth 2 (two) times daily.  2 Past Week at Unknown time  . SKYRIZI, 150 MG DOSE, 75 MG/0.83ML PSKT Inject 1 Dose into the skin every 3 (three) months.   Past Week at Unknown time  . traMADol (ULTRAM) 50 MG tablet Take 2 tablets (100 mg total) by mouth every 6 (six) hours as needed for moderate pain or severe pain. 20 tablet 0 Past Week at Unknown time  . triamcinolone ointment (KENALOG) 0.1 % Apply 1 application topically as directed.   Past Week at Unknown time     Allergies  Allergen Reactions  . Codeine Hives, Itching and Rash  . Other Itching  and Other (See Comments)    States antibiotic in the past caused itching but can not remember name     Past Medical History:  Diagnosis Date  . Anxiety   . Arthritis   . Asthma   . Coronary artery disease    mild, nonobstructive  . Depression   . Dyspnea    on exertion  . Dysrhythmia    Atrial Fibrillation  . GERD (gastroesophageal reflux disease)   . Heart murmur   . Hyperlipidemia   . Hypertension   . Hypothyroidism   . MI, old 2016   nonobstructive CAD by Cath  . Morbid obesity ([place])   . Persistent atrial fibrillation ([place])   . Pneumonia 06/2018   and RSV  . Psoriasis   . Sleep apnea    compliant with CPAP  . Thyroid disease     Review of systems:  Otherwise negative.    Physical Exam  Gen: Alert, oriented. Appears stated age.  HEENT: [place]/AT. PERRLA. Lungs: CTA, no wheezes. CV: RR nl S1, S2. Abd: soft, benign, no masses. BS+ Ext: No edema. Pulses 2+    Planned procedures: Proceed with EGD and colonoscopy. The patient understands the nature of the planned procedure, indications, risks, alternatives and potential complications including but not limited to bleeding, infection, perforation, damage to internal organs and possible oversedation/side effects from anesthesia. The patient agrees and gives consent to proceed.  Please refer to procedure notes for findings, recommendations and patient disposition/instructions.     Patricia Walker K. Patricia Walker, M.D. Gastroenterology 08/28/2019  8:56 AM

## 2019-08-28 NOTE — Anesthesia Preprocedure Evaluation (Signed)
Anesthesia Evaluation  Patient identified by MRN, date of birth, ID band Patient awake    Reviewed: Allergy & Precautions, H&P , NPO status , Patient's Chart, lab work & pertinent test results  Airway Mallampati: III  TM Distance: >3 FB Neck ROM: full    Dental  (+) Chipped, Poor Dentition   Pulmonary shortness of breath, asthma , sleep apnea , former smoker,    Pulmonary exam normal breath sounds clear to auscultation       Cardiovascular hypertension, + CAD and + Past MI  + dysrhythmias Atrial Fibrillation  Rhythm:irregular     Neuro/Psych  Headaches, PSYCHIATRIC DISORDERS Anxiety Depression    GI/Hepatic Neg liver ROS, GERD  ,  Endo/Other  Hypothyroidism Morbid obesity  Renal/GU negative Renal ROS  negative genitourinary   Musculoskeletal   Abdominal   Peds  Hematology negative hematology ROS (+)   Anesthesia Other Findings Past Medical History: No date: Anxiety No date: Arthritis No date: Asthma No date: Coronary artery disease     Comment:  mild, nonobstructive No date: Depression No date: Dyspnea     Comment:  on exertion No date: Dysrhythmia     Comment:  Atrial Fibrillation No date: GERD (gastroesophageal reflux disease) No date: Heart murmur No date: Hyperlipidemia No date: Hypertension No date: Hypothyroidism 2016: MI, old     Comment:  nonobstructive CAD by Cath No date: Morbid obesity ([place]) No date: Persistent atrial fibrillation ([place]) 06/2018: Pneumonia     Comment:  and RSV No date: Psoriasis No date: Sleep apnea     Comment:  compliant with CPAP No date: Thyroid disease  Past Surgical History: 07/21/2017: ARTERY BIOPSY; Right     Comment:  Procedure: BIOPSY TEMPORAL ARTERY;  Surgeon: [name], MD;  Location: [facility] ORS;  Service: Vascular;                Laterality: Right; No date: CARDIAC CATHETERIZATION 09/01/2016: CARDIOVERSION; Right     Comment:   Procedure: Cardioversion;  Surgeon: [name], MD;              Location: [facility] ORS;  Service: Cardiovascular;                Laterality: Right; 09/09/2016: CARDIOVERSION; N/A     Comment:  Procedure: Cardioversion;  Surgeon: [name], MD;              Location: [facility] ORS;  Service: Cardiovascular;                Laterality: N/A; 03/26/2019: CATARACT EXTRACTION W/PHACO; Right     Comment:  Procedure: CATARACT EXTRACTION PHACO AND INTRAOCULAR               LENS PLACEMENT (IOC) RIGHT 6.45, 00:39.9;  Surgeon:               Birder Robson, MD;  Location: [place];                Service: Ophthalmology;  Laterality: Right; 04/16/2019: CATARACT EXTRACTION W/PHACO; Left     Comment:  Procedure: CATARACT EXTRACTION PHACO AND INTRAOCULAR               LENS PLACEMENT (IOC) LEFT;   3.14, 00:24.9;  Surgeon:               Birder Robson, MD;  Location: [place];  Service: Ophthalmology;  Laterality: Left;  sleep               apnea-CPAP 02/01/2016: ELECTROPHYSIOLOGIC STUDY; N/A     Comment:  Procedure: CARDIOVERSION;  Surgeon: [name], MD;               Location: [facility] ORS;  Service: Cardiovascular;                Laterality: N/A; No date: ESOPHAGEAL DILATION 02/06/2017: ESOPHAGOGASTRODUODENOSCOPY (EGD) WITH PROPOFOL; N/A     Comment:  Procedure: ESOPHAGOGASTRODUODENOSCOPY (EGD) WITH               PROPOFOL;  Surgeon: [name], MD;  Location: [facility]               ENDOSCOPY;  Service: Gastroenterology;  Laterality: N/A; 02/16/2017: EUS; N/A     Comment:  Procedure: FULL UPPER ENDOSCOPIC ULTRASOUND (EUS)               RADIAL;  Surgeon: [name], MD;  Location: [facility]               ENDOSCOPY;  Service: Gastroenterology;  Laterality: N/A; 09/08/2016: LEFT HEART CATH AND CORONARY ANGIOGRAPHY; N/A     Comment:  Procedure: Left Heart Cath and Coronary Angiography;                Surgeon: [name], MD;  Location: [place] CV               LAB;  Service: Cardiovascular;  Laterality: N/A; No date: US ECHOCARDIOGRAPHY  BMI    Body Mass Index: 48.92 kg/m      Reproductive/Obstetrics negative OB ROS                             Anesthesia Physical Anesthesia Plan  ASA: III  Anesthesia Plan: General   Post-op Pain Management:    Induction:   PONV Risk Score and Plan: Propofol infusion and TIVA  Airway Management Planned: Nasal CPAP  Additional Equipment:   Intra-op Plan:   Post-operative Plan:   Informed Consent: I have reviewed the patients History and Physical, chart, labs and discussed the procedure including the risks, benefits and alternatives for the proposed anesthesia with the patient or authorized representative who has indicated his/her understanding and acceptance.     Dental Advisory Given  Plan Discussed with: Anesthesiologist, CRNA and Surgeon  Anesthesia Plan Comments:         Anesthesia Quick Evaluation

## 2019-08-28 NOTE — Op Note (Signed)
[facility] Gastroenterology Patient Name: Patricia Walker Procedure Date: 08/28/2019 9:24 AM MRN: [id number] Account #: [id number] Date of Birth: 11-13-1945 Admit Type: Outpatient Age: 74 Room: [facility] ENDO ROOM 3 Gender: Female Note Status: Finalized Procedure:             Upper GI endoscopy Indications:           Dysphagia, Gastro-esophageal reflux disease, Failure                         to respond to medical treatment Providers:             Benay Pike. Mirza Fessel MD, MD Medicines:             Propofol per Anesthesia Complications:         No immediate complications. Procedure:             Pre-Anesthesia Assessment:                        - The risks and benefits of the procedure and the                         sedation options and risks were discussed with the                         patient. All questions were answered and informed                         consent was obtained.                        - Patient identification and proposed procedure were                         verified prior to the procedure by the nurse. The                         procedure was verified in the procedure room.                        - ASA Grade Assessment: III - A patient with severe                         systemic disease.                        - After reviewing the risks and benefits, the patient                         was deemed in satisfactory condition to undergo the                         procedure.                        After obtaining informed consent, the endoscope was                         passed under direct vision. Throughout the procedure,  the patient's blood pressure, pulse, and oxygen                         saturations were monitored continuously. The Endoscope                         was introduced through the mouth, and advanced to the                         third part of duodenum. The upper GI endoscopy was   accomplished without difficulty. The patient tolerated                         the procedure well. Findings:      LA Grade A (one or more mucosal breaks less than 5 mm, not extending       between tops of 2 mucosal folds) esophagitis with no bleeding was found       in the distal esophagus. Biopsies were obtained from the proximal and       distal esophagus with cold forceps for histology of suspected       eosinophilic esophagitis.      One benign-appearing, intrinsic mild stenosis was found at the       gastroesophageal junction. This stenosis measured 1.5 cm (inner       diameter) x less than one cm (in length). The stenosis was traversed.       The scope was withdrawn. Dilation was performed with a Maloney dilator       with no resistance at 65 Fr.      A 2 cm hiatal hernia was present.      The examined duodenum was normal.      The exam was otherwise without abnormality. Impression:            - LA Grade A reflux esophagitis with no bleeding.                         Biopsied.                        - Benign-appearing esophageal stenosis. Dilated.                        - 2 cm hiatal hernia.                        - Normal examined duodenum.                        - The examination was otherwise normal. Recommendation:        - Await pathology results.                        - Monitor results to esophageal dilation                        - Proceed with colonoscopy Procedure Code(s):     --- Professional ---                        [phone], Esophagogastroduodenoscopy, flexible,  transoral; with biopsy, single or multiple                        [id number], Dilation of esophagus, by unguided sound or                         bougie, single or multiple passes Diagnosis Code(s):     --- Professional ---                        R13.10, Dysphagia, unspecified                        K44.9, Diaphragmatic hernia without obstruction or                         gangrene                         K22.2, Esophageal obstruction                        K21.00, Gastro-esophageal reflux disease with                         esophagitis, without bleeding CPT copyright 2019 American Medical Association. All rights reserved. The codes documented in this report are preliminary and upon coder review [place]  be revised to meet current compliance requirements. Efrain Sella MD, MD 08/28/2019 9:40:44 AM This report has been signed electronically. Number of Addenda: 0 Note Initiated On: 08/28/2019 9:24 AM Estimated Blood Loss:  Estimated blood loss: none.      [facility]

## 2019-08-29 ENCOUNTER — Encounter: Payer: Self-pay | Admitting: *Deleted

## 2019-08-29 LAB — SURGICAL PATHOLOGY

## 2019-08-31 NOTE — Anesthesia Postprocedure Evaluation (Signed)
Anesthesia Post Note  Patient: Patricia Walker  Procedure(s) Performed: COLONOSCOPY WITH PROPOFOL (N/A ) ESOPHAGOGASTRODUODENOSCOPY (EGD) WITH PROPOFOL (N/A )  Patient location during evaluation: PACU Anesthesia Type: General Level of consciousness: awake and alert Pain management: pain level controlled Vital Signs Assessment: post-procedure vital signs reviewed and stable Respiratory status: spontaneous breathing, nonlabored ventilation and respiratory function stable Cardiovascular status: blood pressure returned to baseline and stable Postop Assessment: no apparent nausea or vomiting Anesthetic complications: no     Last Vitals:  Vitals:   08/28/19 1014 08/28/19 1024  BP: 138/80 129/78  Pulse:    Resp:    Temp:    SpO2:      Last Pain:  Vitals:   08/29/19 0743  TempSrc:   PainSc: 0-No pain                 Tera Mater

## 2019-09-03 DIAGNOSIS — L409 Psoriasis, unspecified: Principal | ICD-10-CM

## 2019-09-03 MED ORDER — METHOTREXATE SODIUM 2.5 MG TABLET
ORAL_TABLET | 0 refills | 0 days | Status: CP
Start: 2019-09-03 — End: ?

## 2019-10-06 DIAGNOSIS — L409 Psoriasis, unspecified: Principal | ICD-10-CM

## 2019-10-07 MED ORDER — METHOTREXATE SODIUM 2.5 MG TABLET
ORAL_TABLET | 0 refills | 0 days | Status: CP
Start: 2019-10-07 — End: ?

## 2019-11-02 DIAGNOSIS — L409 Psoriasis, unspecified: Principal | ICD-10-CM

## 2019-11-05 MED ORDER — METHOTREXATE SODIUM 2.5 MG TABLET
ORAL_TABLET | 0 refills | 0 days | Status: CP
Start: 2019-11-05 — End: ?

## 2020-01-28 MED ORDER — FLUOCINOLONE ACETONIDE OIL 0.01 % EAR DROPS
Freq: Two times a day (BID) | OTIC | 2 refills | 200.00000 days | Status: CP
Start: 2020-01-28 — End: ?

## 2020-03-10 ENCOUNTER — Ambulatory Visit: Admit: 2020-03-10 | Discharge: 2020-03-11 | Payer: MEDICARE | Attending: Dermatology | Primary: Dermatology

## 2020-03-10 DIAGNOSIS — Z79899 Other long term (current) drug therapy: Principal | ICD-10-CM

## 2020-03-10 DIAGNOSIS — K219 Gastro-esophageal reflux disease without esophagitis: Principal | ICD-10-CM

## 2020-03-10 DIAGNOSIS — R21 Rash and other nonspecific skin eruption: Principal | ICD-10-CM

## 2020-03-10 DIAGNOSIS — I2510 Atherosclerotic heart disease of native coronary artery without angina pectoris: Principal | ICD-10-CM

## 2020-03-10 DIAGNOSIS — L299 Pruritus, unspecified: Principal | ICD-10-CM

## 2020-03-10 DIAGNOSIS — L409 Psoriasis, unspecified: Principal | ICD-10-CM

## 2020-03-10 DIAGNOSIS — D492 Neoplasm of unspecified behavior of bone, soft tissue, and skin: Principal | ICD-10-CM

## 2020-03-10 MED ORDER — CLOBETASOL 0.05 % SCALP SOLUTION
Freq: Two times a day (BID) | TOPICAL | 3 refills | 0.00000 days | Status: CP
Start: 2020-03-10 — End: 2021-03-10

## 2020-03-10 MED ORDER — TRIAMCINOLONE ACETONIDE 0.1 % TOPICAL OINTMENT
INTRAMUSCULAR | 3 refills | 0.00000 days | Status: CP
Start: 2020-03-10 — End: ?

## 2020-03-23 ENCOUNTER — Other Ambulatory Visit: Payer: Self-pay

## 2020-03-23 ENCOUNTER — Other Ambulatory Visit: Payer: Self-pay | Admitting: Sports Medicine

## 2020-03-23 ENCOUNTER — Emergency Department: Payer: Medicare HMO

## 2020-03-23 DIAGNOSIS — I482 Chronic atrial fibrillation, unspecified: Secondary | ICD-10-CM | POA: Insufficient documentation

## 2020-03-23 DIAGNOSIS — M5136 Other intervertebral disc degeneration, lumbar region: Secondary | ICD-10-CM

## 2020-03-23 DIAGNOSIS — R079 Chest pain, unspecified: Secondary | ICD-10-CM | POA: Diagnosis not present

## 2020-03-23 DIAGNOSIS — M5442 Lumbago with sciatica, left side: Secondary | ICD-10-CM

## 2020-03-23 DIAGNOSIS — I25110 Atherosclerotic heart disease of native coronary artery with unstable angina pectoris: Secondary | ICD-10-CM | POA: Diagnosis not present

## 2020-03-23 DIAGNOSIS — E039 Hypothyroidism, unspecified: Secondary | ICD-10-CM | POA: Insufficient documentation

## 2020-03-23 DIAGNOSIS — I10 Essential (primary) hypertension: Secondary | ICD-10-CM | POA: Insufficient documentation

## 2020-03-23 DIAGNOSIS — Z79899 Other long term (current) drug therapy: Secondary | ICD-10-CM | POA: Diagnosis not present

## 2020-03-23 DIAGNOSIS — Z87891 Personal history of nicotine dependence: Secondary | ICD-10-CM | POA: Insufficient documentation

## 2020-03-23 DIAGNOSIS — M62830 Muscle spasm of back: Secondary | ICD-10-CM

## 2020-03-23 DIAGNOSIS — J45909 Unspecified asthma, uncomplicated: Secondary | ICD-10-CM | POA: Insufficient documentation

## 2020-03-23 DIAGNOSIS — M4726 Other spondylosis with radiculopathy, lumbar region: Secondary | ICD-10-CM

## 2020-03-23 DIAGNOSIS — Z7901 Long term (current) use of anticoagulants: Secondary | ICD-10-CM | POA: Insufficient documentation

## 2020-03-23 LAB — COMPREHENSIVE METABOLIC PANEL
ALT: 20 U/L (ref 0–44)
AST: 26 U/L (ref 15–41)
Albumin: 3.8 g/dL (ref 3.5–5.0)
Alkaline Phosphatase: 97 U/L (ref 38–126)
Anion gap: 10 (ref 5–15)
BUN: 23 mg/dL (ref 8–23)
CO2: 22 mmol/L (ref 22–32)
Calcium: 9 mg/dL (ref 8.9–10.3)
Chloride: 102 mmol/L (ref 98–111)
Creatinine, Ser: 0.97 mg/dL (ref 0.44–1.00)
GFR, Estimated: >60 mL/min (ref >60)
Glucose, Bld: 255 mg/dL — ABNORMAL HIGH (ref 70–99)
Potassium: 4.1 mmol/L (ref 3.5–5.1)
Sodium: 134 mmol/L — ABNORMAL LOW (ref 135–145)
Total Bilirubin: 0.6 mg/dL (ref 0.3–1.2)
Total Protein: 7.2 g/dL (ref 6.5–8.1)

## 2020-03-23 LAB — TROPONIN I (HIGH SENSITIVITY): Troponin I (High Sensitivity): 15 ng/L (ref <18)

## 2020-03-23 LAB — CBC
HCT: 43.1 % (ref 36.0–46.0)
Hemoglobin: 14.1 g/dL (ref 12.0–15.0)
MCH: 28.3 pg (ref 26.0–34.0)
MCHC: 32.7 g/dL (ref 30.0–36.0)
MCV: 86.5 fL (ref 80.0–100.0)
Platelets: 304 10*3/uL (ref 150–400)
RBC: 4.98 MIL/uL (ref 3.87–5.11)
RDW: 13.2 % (ref 11.5–15.5)
WBC: 5.8 10*3/uL (ref 4.0–10.5)
nRBC: 0 % (ref 0.0–0.2)

## 2020-03-23 NOTE — ED Triage Notes (Signed)
Pt in with co chest pain since this am, EMS gave nitro x1 pain from 8-6. Pt has hx of afib did not take her meds this am.

## 2020-03-24 ENCOUNTER — Emergency Department
Admission: EM | Admit: 2020-03-24 | Discharge: 2020-03-24 | Disposition: A | Discharge status: Home / Self Care | Payer: Medicare HMO | Attending: Emergency Medicine | Admitting: Emergency Medicine

## 2020-03-24 DIAGNOSIS — R079 Chest pain, unspecified: Secondary | ICD-10-CM

## 2020-03-24 DIAGNOSIS — Z7901 Long term (current) use of anticoagulants: Secondary | ICD-10-CM

## 2020-03-24 DIAGNOSIS — I482 Chronic atrial fibrillation, unspecified: Secondary | ICD-10-CM

## 2020-03-24 LAB — TROPONIN I (HIGH SENSITIVITY): Troponin I (High Sensitivity): 17 ng/L (ref <18)

## 2020-03-24 MED ORDER — ACETAMINOPHEN 325 MG PO TABS
650.0000 mg | ORAL_TABLET | Freq: Once | ORAL | Status: AC
  Administered 2020-03-24: 650 mg via ORAL
  Filled 2020-03-24: qty 2

## 2020-03-24 NOTE — ED Notes (Signed)
Tylenol admin at pt request for c/o HA

## 2020-03-24 NOTE — ED Notes (Signed)
Pt reports chest pain that began this morning and has not subsided evn after taking two of her prescribed nitroglycerin tabs at home.

## 2020-03-24 NOTE — ED Provider Notes (Signed)
[facility] Emergency Department Provider Note  ____________________________________________   Event Date/Time   First MD Initiated Contact with Patient 03/24/20 [phone]     (approximate)  I have reviewed the triage vital signs and the nursing notes.   HISTORY  Chief Complaint Chest Pain    HPI Patricia Walker is a 74 y.o. female with medical history as listed below which notably includes mild nonobstructive CAD and history of A. fib.  She presents tonight for evaluation of  intermittent sharp and pressure-like chest pain over the last 24 hours.  Nothing particular made it better or worse and it was acute in onset and severe at times, mild at other times.  She is not feeling it currently but has felt that several times since coming to the emergency department.  Yesterday morning she went to her orthopedic surgeon because her back has been bothering her and she was having chest pain at that time.  She was prescribed gabapentin and steroids for the back pain and she got that prescription filled.  Later in the day she started having worse chest pain and one time she felt like the pain was also involving her left arm and hand.  She has not been short of breath.  She denies fever COVID through and through, nausea, vomiting, abdominal pain, and dysuria.  She tried taking a couple of nitroglycerin but says that it did not really help.        Past Medical History:  Diagnosis Date  . Anxiety   . Arthritis   . Asthma   . Coronary artery disease    mild, nonobstructive  . Depression   . Dyspnea    on exertion  . Dysrhythmia    Atrial Fibrillation  . GERD (gastroesophageal reflux disease)   . Heart murmur   . Hyperlipidemia   . Hypertension   . Hypothyroidism   . MI, old 2016   nonobstructive CAD by Cath  . Morbid obesity ([place])   . Persistent atrial fibrillation ([place])   . Pneumonia 06/2018   and RSV  . Psoriasis   . Sleep apnea    compliant with CPAP  .  Thyroid disease     Patient Active Problem List   Diagnosis Date Noted  . Pneumonia 06/15/2018  . Multifocal pneumonia 06/14/2018  . Unstable angina ([place]) 06/13/2017  . New onset of headaches 06/12/2017  . Photophobia of right eye 06/12/2017  . Osteoarthritis of knee 11/17/2016  . Coronary artery disease 09/16/2016  . History of cardioversion 09/16/2016  . Hyperlipidemia, unspecified 09/16/2016  . Hypertension 09/16/2016  . Obesity, unspecified 09/16/2016  . Atrial fibrillation ([place]) 09/08/2016  . Cardiac arrhythmia 09/09/2015  . Gastroesophageal reflux disease 09/09/2015  . Obstructive sleep apnea syndrome 09/09/2015  . Psoriasis 09/09/2015    Past Surgical History:  Procedure Laterality Date  . ARTERY BIOPSY Right 07/21/2017   Procedure: BIOPSY TEMPORAL ARTERY;  Surgeon: [name], MD;  Location: [facility] ORS;  Service: Vascular;  Laterality: Right;  . CARDIAC CATHETERIZATION    . CARDIOVERSION Right 09/01/2016   Procedure: Cardioversion;  Surgeon: [name], MD;  Location: [facility] ORS;  Service: Cardiovascular;  Laterality: Right;  . CARDIOVERSION N/A 09/09/2016   Procedure: Cardioversion;  Surgeon: [name], MD;  Location: [facility] ORS;  Service: Cardiovascular;  Laterality: N/A;  . CATARACT EXTRACTION W/PHACO Right 03/26/2019   Procedure: CATARACT EXTRACTION PHACO AND INTRAOCULAR LENS PLACEMENT (IOC) RIGHT 6.45, 00:39.9;  Surgeon: Birder Robson, MD;  Location:  [place];  Service: Ophthalmology;  Laterality: Right;  . CATARACT EXTRACTION W/PHACO Left 04/16/2019   Procedure: CATARACT EXTRACTION PHACO AND INTRAOCULAR LENS PLACEMENT (IOC) LEFT;   3.14, 00:24.9;  Surgeon: Birder Robson, MD;  Location: [place];  Service: Ophthalmology;  Laterality: Left;  sleep apnea-CPAP  . COLONOSCOPY WITH PROPOFOL N/A 08/28/2019   Procedure: COLONOSCOPY WITH PROPOFOL;  Surgeon: Toledo, Benay Pike, MD;  Location: [facility] ENDOSCOPY;  Service: Gastroenterology;   Laterality: N/A;  . ELECTROPHYSIOLOGIC STUDY N/A 02/01/2016   Procedure: CARDIOVERSION;  Surgeon: [name], MD;  Location: [facility] ORS;  Service: Cardiovascular;  Laterality: N/A;  . ESOPHAGEAL DILATION    . ESOPHAGOGASTRODUODENOSCOPY (EGD) WITH PROPOFOL N/A 02/06/2017   Procedure: ESOPHAGOGASTRODUODENOSCOPY (EGD) WITH PROPOFOL;  Surgeon: [name], MD;  Location[facility];  Service: Gastroenterology;  Laterality: N/A;  . ESOPHAGOGASTRODUODENOSCOPY (EGD) WITH PROPOFOL N/A 08/28/2019   Procedure: ESOPHAGOGASTRODUODENOSCOPY (EGD) WITH PROPOFOL;  Surgeon: Toledo, Benay Pike, MD;  Location: [facility] ENDOSCOPY;  Service: Gastroenterology;  Laterality: N/A;  . EUS N/A 02/16/2017   Procedure: FULL UPPER ENDOSCOPIC ULTRASOUND (EUS) RADIAL;  Surgeon: [name], MD;  Location: [facility] ENDOSCOPY;  Service: Gastroenterology;  Laterality: N/A;  . LEFT HEART CATH AND CORONARY ANGIOGRAPHY N/A 09/08/2016   Procedure: Left Heart Cath and Coronary Angiography;  Surgeon: [name], MD;  Location: [place] CV LAB;  Service: Cardiovascular;  Laterality: N/A;  . US ECHOCARDIOGRAPHY      Prior to Admission medications   Medication Sig Start Date End Date Taking? Authorizing Provider  albuterol (PROVENTIL HFA;VENTOLIN HFA) 108 (90 Base) MCG/ACT inhaler Inhale 2 puffs into the lungs every 6 (six) hours as needed for wheezing or shortness of breath.    [provider]  apixaban (ELIQUIS) 5 MG TABS tablet Take 5 mg by mouth 2 (two) times daily.    [provider]  atorvastatin (LIPITOR) 20 MG tablet Take 20 mg by mouth every evening. 08/25/16   [provider]  azelastine (ASTELIN) 0.1 % nasal spray Place 2 sprays into both nostrils 2 (two) times daily as needed for rhinitis or allergies.  10/13/16   [provider]  baclofen (LIORESAL) 10 MG tablet Take 10 mg by mouth 2 (two) times daily. 06/13/17   [provider]  benzonatate (TESSALON) 100 MG capsule Take 1  capsule (100 mg total) by mouth 3 (three) times daily as needed for cough. 06/18/18   Hillary Bow, MD  budesonide-formoterol (SYMBICORT) 160-4.5 MCG/ACT inhaler Inhale 2 puffs into the lungs 2 (two) times daily.     [provider]  doxepin (SINEQUAN) 25 MG capsule Take 25 mg by mouth at bedtime.    [provider]  esomeprazole (NEXIUM) 40 MG capsule Take 40 mg by mouth daily. 07/01/17   [provider]  folic acid (FOLVITE) 1 MG tablet Take 1 mg by mouth daily.    [provider]  furosemide (LASIX) 20 MG tablet Take 20 mg by mouth daily as needed for fluid. 08/24/16   [provider]  gabapentin (NEURONTIN) 300 MG capsule Take 300 mg by mouth 2 (two) times daily.    [provider]  levofloxacin (LEVAQUIN) 500 MG tablet Take 1 tablet (500 mg total) by mouth daily. 06/18/18   Hillary Bow, MD  levothyroxine (SYNTHROID, LEVOTHROID) 50 MCG tablet Take 50 mcg by mouth daily before breakfast.    [provider]  methotrexate (RHEUMATREX) 2.5 MG tablet Take 10 mg by mouth once a week. Caution:Chemotherapy. Protect  from light.    [provider]  montelukast (SINGULAIR) 10 MG tablet Take 10 mg by mouth at bedtime.  05/26/17   [provider]  PARoxetine (PAXIL) 20 MG tablet Take 20 mg by mouth daily.    [provider]  predniSONE (STERAPRED UNI-PAK 21 TAB) 10 MG (21) TBPK tablet 6 tabs day 1 and taper 10 mg a day - 6 days 06/18/18   Hillary Bow, MD  predniSONE (STERAPRED UNI-PAK 21 TAB) 10 MG (21) TBPK tablet Per packaging instructions 06/25/18   Nance Pear, MD  pregabalin (LYRICA) 50 MG capsule Take 50 mg by mouth daily as needed for pain.    [provider]  ranitidine (ZANTAC) 150 MG tablet Take 150 mg by mouth 2 (two) times daily. 04/27/17   [provider]  SKYRIZI, 150 MG DOSE, 75 MG/0.83ML PSKT Inject 1 Dose into the skin every 3 (three) months. 05/09/18   [provider]   traMADol (ULTRAM) 50 MG tablet Take 2 tablets (100 mg total) by mouth every 6 (six) hours as needed for moderate pain or severe pain. 08/23/19   Hinda Kehr, MD  triamcinolone ointment (KENALOG) 0.1 % Apply 1 application topically as directed. 05/09/18   [provider]    Allergies Codeine and Other  Family History  Problem Relation Age of Onset  . Breast cancer Paternal Aunt   . Other Father   . Heart attack Father     Social History Social History   Tobacco Use  . Smoking status: Former Smoker    Types: Cigarettes    Quit date: 02/01/2013    Years since quitting: 7.1  . Smokeless tobacco: Never Used  Vaping Use  . Vaping Use: Never used  Substance Use Topics  . Alcohol use: No  . Drug use: No    Review of Systems Constitutional: No fever/chills Eyes: No visual changes. ENT: No sore throat. Cardiovascular: +chest pain. Respiratory: Denies shortness of breath. Gastrointestinal: No abdominal pain.  No nausea, no vomiting.  No diarrhea.  No constipation. Genitourinary: Negative for dysuria. Musculoskeletal: Negative for neck pain.  Negative for back pain. Integumentary: Negative for rash. Neurological: Negative for headaches, focal weakness or numbness.   ____________________________________________   PHYSICAL EXAM:  VITAL SIGNS: ED Triage Vitals  Enc Vitals Group     BP 03/23/20 2204 126/66     Pulse Rate 03/23/20 2204 82     Resp 03/23/20 2204 20     Temp 03/23/20 2204 98.9 F (37.2 C)     Temp Source 03/23/20 2204 Oral     SpO2 03/23/20 2204 93 %     Weight 03/23/20 2207 (!) 137 kg (302 lb)     Height 03/23/20 2207 1.626 m (5\' 4" )     Head Circumference --      Peak Flow --      Pain Score 03/23/20 2207 6     Pain Loc --      Pain Edu? --      Excl. in [place]? --     Constitutional: Alert and oriented.  Eyes: Conjunctivae are normal.  Head: Atraumatic. Nose: No congestion/rhinnorhea. Mouth/Throat: Patient is wearing a mask. Neck: No  stridor.  No meningeal signs.   Cardiovascular: Normal rate, irregularly irregular rhythm. Respiratory: Normal respiratory effort.  No retractions. Gastrointestinal: Soft and nontender. No distention.  Musculoskeletal: No lower extremity tenderness nor edema. No gross deformities of extremities. Neurologic:  Normal speech and language. No gross focal neurologic deficits are  appreciated.  Skin:  Skin is warm, dry and intact. Psychiatric: Mood and affect are normal. Speech and behavior are normal.  ____________________________________________   LABS (all labs ordered are listed, but only abnormal results are displayed)  Labs Reviewed  COMPREHENSIVE METABOLIC PANEL - Abnormal; Notable for the following components:      Result Value   Sodium 134 (*)    Glucose, Bld 255 (*)    All other components within normal limits  CBC  TROPONIN I (HIGH SENSITIVITY)  TROPONIN I (HIGH SENSITIVITY)   ____________________________________________  EKG  ED ECG REPORT I, Hinda Kehr, the attending physician, personally viewed and interpreted this ECG.  Date: 03/23/2020 EKG Time: 22: 04 Rate: 87 Rhythm: Atrial fibrillation QRS Axis: normal Intervals: LVH, normal other than lack of P waves ST/T Wave abnormalities: Non-specific ST segment / T-wave changes, but no clear evidence of acute ischemia. Narrative Interpretation: no definitive evidence of acute ischemia; does not meet STEMI criteria.   ____________________________________________  RADIOLOGY I, Hinda Kehr, personally viewed and evaluated these images (plain radiographs) as part of my medical decision making, as well as reviewing the written report by the radiologist.  ED MD interpretation: No acute abnormality identified on chest x-ray  Official radiology report(s): DG Chest 2 View  Result Date: 03/23/2020 CLINICAL DATA:  Chest pain EXAM: CHEST - 2 VIEW COMPARISON:  06/25/2018 FINDINGS: Heart and mediastinal contours are within  normal limits. No focal opacities or effusions. No acute bony abnormality. IMPRESSION: No active cardiopulmonary disease. Electronically Signed   By: Rolm Baptise M.D.   On: 03/23/2020 22:42    ____________________________________________   PROCEDURES   Procedure(s) performed (including Critical Care):  .1-3 Lead EKG Interpretation Performed by: Hinda Kehr, MD Authorized by: Hinda Kehr, MD     Interpretation: abnormal     ECG rate:  82   ECG rate assessment: normal     Rhythm: atrial fibrillation     Ectopy: none     Conduction: normal       ____________________________________________   INITIAL IMPRESSION / MDM / ASSESSMENT AND PLAN / ED COURSE  As part of my medical decision making, I reviewed the following data within the [facility] notes reviewed and incorporated, Labs reviewed , EKG interpreted , Old chart reviewed, Radiograph reviewed  and Notes from prior ED visits   Differential diagnosis includes, but is not limited to, ACS/CAD, musculoskeletal pain, A. fib, electrolyte or metabolic abnormality, pneumonia, COVID-19, pneumothorax, PE.  The patient is on the cardiac monitor to evaluate for evidence of arrhythmia and/or significant heart rate changes.  Vital signs have been stable and within normal limits for about 8 hours.  No hypoxemia, no tachycardia, mild hypertension.  No evidence of ischemia on EKG.  Patient is on Eliquis even if she is missed a dose today and she has a low risk for PE.  I personally reviewed the patient's imaging and agree with the radiologist's interpretation that there is no evidence of acute abnormality on chest x-ray.  Lab results are reassuring including her comprehensive metabolic panel, CBC, and 2 high-sensitivity troponins, both of which are less than 20 and have a delta of less than 5.  I provided the reassuring results to the patient.  She says she has a follow-up appointment scheduled at 10 AM today, which  is less than 4 hours from now.  We talked about the limited utility of staying in the hospital given low risk for ACS or other emergent medical condition  versus follow-up with her cardiologist in a few hours and she is comfortable with the plan to follow-up.  She is having no discomfort at the time of discharge.  I gave my usual and customary return precautions.         ____________________________________________  FINAL CLINICAL IMPRESSION(S) / ED DIAGNOSES  Final diagnoses:  Chest pain, unspecified type  Chronic atrial fibrillation ([place])  Long term current use of anticoagulant     MEDICATIONS GIVEN DURING THIS VISIT:  Medications  acetaminophen (TYLENOL) tablet 650 mg (650 mg Oral Given 03/24/20 0331)     ED Discharge Orders    None      *Please note:  Patricia Walker was evaluated in Emergency Department on 03/24/2020 for the symptoms described in the history of present illness. She was evaluated in the context of the global COVID-19 pandemic, which necessitated consideration that the patient might be at risk for infection with the SARS-CoV-2 virus that causes COVID-19. Institutional protocols and algorithms that pertain to the evaluation of patients at risk for COVID-19 are in a state of rapid change based on information released by regulatory bodies including the CDC and federal and state organizations. These policies and algorithms were followed during the patient's care in the ED.  Some ED evaluations and interventions [place] be delayed as a result of limited staffing during and after the pandemic.*  Note:  This document was prepared using Dragon voice recognition software and [place] include unintentional dictation errors.   Hinda Kehr, MD 03/24/20 [phone]

## 2020-03-24 NOTE — Discharge Instructions (Addendum)
You have been seen in the Emergency Department (ED) today for chest pain.  As we have discussed today's test results are normal, but you Wojtas[place] require further testing.  Please follow up with the recommended doctor as instructed above in these documents regarding today's emergent visit and your recent symptoms to discuss further management.  Continue to take your regular medications.   Return to the Emergency Department (ED) if you experience any further chest pain/pressure/tightness, difficulty breathing, or sudden sweating, or other symptoms that concern you.    

## 2020-03-31 DIAGNOSIS — L409 Psoriasis, unspecified: Principal | ICD-10-CM

## 2020-04-01 ENCOUNTER — Ambulatory Visit: Payer: Medicare HMO

## 2020-04-01 ENCOUNTER — Other Ambulatory Visit: Payer: Self-pay

## 2020-04-01 ENCOUNTER — Ambulatory Visit
Admission: RE | Admit: 2020-04-01 | Discharge: 2020-04-01 | Disposition: A | Discharge status: Home / Self Care | Payer: Medicare HMO | Source: Ambulatory Visit | Attending: Sports Medicine | Admitting: Sports Medicine

## 2020-04-01 DIAGNOSIS — M5441 Lumbago with sciatica, right side: Secondary | ICD-10-CM | POA: Insufficient documentation

## 2020-04-01 DIAGNOSIS — M5442 Lumbago with sciatica, left side: Secondary | ICD-10-CM | POA: Insufficient documentation

## 2020-04-01 DIAGNOSIS — M5136 Other intervertebral disc degeneration, lumbar region: Secondary | ICD-10-CM | POA: Diagnosis present

## 2020-04-01 DIAGNOSIS — G8929 Other chronic pain: Secondary | ICD-10-CM | POA: Diagnosis present

## 2020-04-01 DIAGNOSIS — M4726 Other spondylosis with radiculopathy, lumbar region: Secondary | ICD-10-CM | POA: Insufficient documentation

## 2020-04-01 DIAGNOSIS — M62830 Muscle spasm of back: Secondary | ICD-10-CM | POA: Diagnosis present

## 2020-04-07 ENCOUNTER — Other Ambulatory Visit: Admit: 2020-04-07 | Discharge: 2020-04-08 | Payer: MEDICARE

## 2020-04-07 DIAGNOSIS — L409 Psoriasis, unspecified: Principal | ICD-10-CM

## 2020-04-07 DIAGNOSIS — Z79899 Other long term (current) drug therapy: Principal | ICD-10-CM

## 2020-04-13 DIAGNOSIS — L409 Psoriasis, unspecified: Principal | ICD-10-CM

## 2020-04-13 MED ORDER — HUMIRA PEN CITRATE FREE 40 MG/0.4 ML
SUBCUTANEOUS | 11 refills | 28 days | Status: CP
Start: 2020-04-13 — End: ?
  Filled 2020-04-22: qty 3, 30d supply, fill #0

## 2020-04-13 MED ORDER — HUMIRA PEN CITRATE FREE STARTER PACK FOR PS/UV 1X 80 MG/0.8 ML, 2X 40 MG/0.4 ML
0 refills | 0 days | Status: CP
Start: 2020-04-13 — End: ?

## 2020-04-15 NOTE — Unmapped (Addendum)
[facility] Specialty Medication Onboarding    Specialty Medication: Humira starter kit and humira maintenance  Prior Authorization: Approved   Financial Assistance: No - copay  <$25  Final Copay/Day Supply: $4 / 28 days for loading and maintenance                             Insurance Restrictions: None     Notes to Pharmacist:     The triage team has completed the benefits investigation and has determined that the patient is able to fill this medication at [facility]. Please contact the patient to complete the onboarding or follow up with the prescribing physician as needed.

## 2020-04-21 ENCOUNTER — Ambulatory Visit: Admit: 2020-04-21 | Discharge: 2020-04-22 | Payer: MEDICARE | Attending: Dermatology | Primary: Dermatology

## 2020-04-21 DIAGNOSIS — D492 Neoplasm of unspecified behavior of bone, soft tissue, and skin: Principal | ICD-10-CM

## 2020-04-21 DIAGNOSIS — L304 Erythema intertrigo: Principal | ICD-10-CM

## 2020-04-21 MED ORDER — NYSTATIN-TRIAMCINOLONE 100,000 UNIT/G-0.1 % TOPICAL CREAM
Freq: Four times a day (QID) | TOPICAL | 3 refills | 0.00000 days | Status: CP
Start: 2020-04-21 — End: 2021-04-21

## 2020-04-21 MED ORDER — EMPTY CONTAINER
2 refills | 0 days
Start: 2020-04-21 — End: ?

## 2020-04-21 NOTE — Unmapped (Signed)
[facility]   Patient Onboarding/Medication Counseling    Lisa Carlson is a 75 y.o. female with psoriasis who I am counseling today on initiation of therapy.  I am speaking to the patient.    Was a Nurse, learning disability used for this call? No    Verified patient's date of birth / HIPAA.    Specialty medication(s) to be sent: Inflammatory Disorders: Humira      Non-specialty medications/supplies to be sent: sharps kit      Medications not needed at this time: na         Humira (adalimumab)    Medication & Administration     Dosage: Plaque psoriasis: Inject 80mg  under the skin on day 1, then 40mg  every 14 days starting on day 8    Lab tests required prior to treatment initiation:  ??? Tuberculosis: Tuberculosis screening resulted in a non-reactive Quantiferon TB Gold assay.  ??? Hepatitis B: Hepatitis B serology studies are complete and non-reactive.    Administration:     Prefilled auto-injector pen  1. Gather all supplies needed for injection on a clean, flat working surface: medication pen removed from packaging, alcohol swab, sharps container, etc.  2. Look at the medication label - look for correct medication, correct dose, and check the expiration date  3. Look at the medication - the liquid visible in the window on the side of the pen device should appear clear and colorless  4. Lay the auto-injector pen on a flat surface and allow it to warm up to room temperature for at least 30-45 minutes  5. Select injection site - you can use the front of your thigh or your belly (but not the area 2 inches around your belly button); if someone else is giving you the injection you can also use your upper arm in the skin covering your triceps muscle  6. Prepare injection site - wash your hands and clean the skin at the injection site with an alcohol swab and let it air dry, do not touch the injection site again before the injection  7. Pull the 2 safety caps straight off - gray/white to uncover the needle cover and the plum cap to uncover the plum activator button, do not remove until immediately prior to injection and do not touch the white needle cover  8. Gently squeeze the area of cleaned skin and hold it firmly to create a firm surface at the selected injection site  9. Put the white needle cover against your skin at the injection site at a 90 degree angle, hold the pen such that you can see the clear medication window  10. Press down and hold the pen firmly against your skin, press the plum activator button to initiate the injection, there will be a click when the injection starts  11. Continue to hold the pen firmly against your skin for about 10-15 seconds - the window will start to turn solid yellow  12. To verify the injection is complete after 10-15 seconds, look and ensure the window is solid yellow and then pull the pen away from your skin  13. Dispose of the used auto-injector pen immediately in your sharps disposal container the needle will be covered automatically  14. If you see any blood at the injection site, press a cotton ball or gauze on the site and maintain pressure until the bleeding stops, do not rub the injection site    Adherence/Missed dose instructions:  If your injection is given more  than 3 days after your scheduled injection date ??? consult your pharmacist for additional instructions on how to adjust your dosing schedule.    Goals of Therapy     - Achieve remission/inactive disease or low/minimal disease activity  - Maintenance of function  - Minimization of systemic manifestations and comorbidities  - Maintenance of effective psychosocial functioning    Side Effects & Monitoring Parameters     ??? Injection site reaction (redness, irritation, inflammation localized to the site of administration)  ??? Signs of a common cold ??? minor sore throat, runny or stuffy nose, etc.  ??? Upset stomach  ??? Headache    The following side effects should be reported to the provider:  ??? Signs of a hypersensitivity reaction ??? rash; hives; itching; red, swollen, blistered, or peeling skin; wheezing; tightness in the chest or throat; difficulty breathing, swallowing, or talking; swelling of the mouth, face, lips, tongue, or throat; etc.  ??? Reduced immune function ??? report signs of infection such as fever; chills; body aches; very bad sore throat; ear or sinus pain; cough; more sputum or change in color of sputum; pain with passing urine; wound that will not heal, etc.  Also at a slightly higher risk of some malignancies (mainly skin and blood cancers) due to this reduced immune function.  o In the case of signs of infection ??? the patient should hold the next dose of Humira?? and call your primary care provider to ensure adequate medical care.  Treatment Ehrhard be resumed when infection is treated and patient is asymptomatic.  ??? Changes in skin ??? a new growth or lump that forms; changes in shape, size, or color of a previous mole or marking  ??? Signs of unexplained bruising or bleeding ??? throwing up blood or emesis that looks like coffee grounds; black, tarry, or bloody stool; etc.  ??? Signs of new or worsening heart failure ??? shortness of breath; sudden weight gain; heartbeat that is not normal; swelling in the arms or legs that is new or worse      Contraindications, Warnings, & Precautions     ??? Have your bloodwork checked as you have been told by your prescriber  ??? Talk with your doctor if you are pregnant, planning to become pregnant, or breastfeeding  ??? Discuss the possible need for holding your dose(s) of Humira?? when a planned procedure is scheduled with the prescriber as it Fazzino delay healing/recovery timeline       Drug/Food Interactions     ??? Medication list reviewed in Epic. The patient was instructed to inform the care team before taking any new medications or supplements. No drug interactions identified.   ??? Talk with you prescriber or pharmacist before receiving any live vaccinations while taking this medication and after you stop taking it    Storage, Handling Precautions, & Disposal     ??? Store this medication in the refrigerator.  Do not freeze  ??? If needed, you Flaum store at room temperature for up to 14 days  ??? Store in original packaging, protected from light  ??? Do not shake  ??? Dispose of used syringes/pens in a sharps disposal container            Current Medications (including OTC/herbals), Comorbidities and Allergies     Current Outpatient Medications   Medication Sig Dispense Refill   ??? albuterol (PROVENTIL HFA;VENTOLIN HFA) 90 mcg/actuation inhaler Inhale 2 puffs every six (6) hours as needed for wheezing.     ???  amoxicillin-clavulanate (AUGMENTIN) 875-125 mg per tablet amoxicillin 875 mg-potassium clavulanate 125 mg tablet     ??? apixaban (ELIQUIS) 5 mg Tab Take 5 mg by mouth Two (2) times a day.     ??? atorvastatin (LIPITOR) 20 MG tablet Take 20 mg by mouth daily.      ??? B-complex with vitamin C (TOTAL B W/C) tablet vitamin b complex with b12     ??? baclofen (LIORESAL) 10 MG tablet Take 10 mg by mouth Two (2) times a day.      ??? betamethasone, augmented, (DIPROLENE) 0.05 % ointment Apply to scaly rash twice daily until smooth, then stop 100 g 5   ??? calcium carbonate (OS-CAL) 500 mg calcium (1,250 mg) chewable tablet Chew 0.5 tablets daily as needed.      ??? camphor-menthoL (SARNA) 0.5-0.5 % lotion Apply as needed for itch 222 mL 3   ??? cetirizine (ZYRTEC) 10 MG tablet Take 10 mg by mouth daily.      ??? ciprofloxacin-dexamethasone (CIPRODEX) otic suspension Administer 1 drop into both ears daily as needed.     ??? clobetasol (TEMOVATE) 0.05 % cream Apply 1 application topically Two (2) times a day.     ??? clobetasoL (TEMOVATE) 0.05 % external solution Apply topically Two (2) times a day. 50 mL 3   ??? clobetasoL (TEMOVATE) 0.05 % ointment Apply 1 application topically Two (2) times a day. 120 g 1   ??? cyclobenzaprine (FLEXERIL) 10 MG tablet cyclobenzaprine 10 mg tablet     ??? diltiazem (TIAZAC) 120 MG 24 hr capsule diltiazem CD 120 mg capsule,extended release 24 hr     ??? empty container Misc Use as directed to dispose of Humira pens. 1 each 2   ??? esomeprazole (NEXIUM) 40 MG capsule Take 40 mg by mouth every morning before breakfast.     ??? fluconazole (DIFLUCAN) 100 MG tablet Take 100 mg by mouth daily.  0   ??? fluocinolone (SYNALAR) 0.01 % external solution fluocinolone 0.01 % topical solution     ??? fluocinolone acetonide oiL 0.01 % Drop Administer 1 drop into both ears Two (2) times a day. 20 mL 2   ??? fluticasone (FLONASE) 50 mcg/actuation nasal spray 2 sprays into each nostril daily.      ??? folic acid (FOLVITE) 1 MG tablet Take 5 tablets daily on non-methotrexate days. 150 tablet 5   ??? furosemide (LASIX) 20 MG tablet Take 20 mg by mouth Two (2) times a day.     ??? gabapentin (NEURONTIN) 300 MG capsule Take 300 mg by mouth Three (3) times a day.     ??? HUMIRA PEN CITRATE FREE 40 MG/0.4 ML Inject the contents of 1 pen (40 mg total) under the skin every fourteen (14) days. 2 each 11   ??? HUMIRA PEN CITRATE FREE STARTER PACK FOR PS/UV 1X 80 MG/0.8 ML, 2X 40 MG/0.4 ML Inject the contents of 1 pen (80 mg) as initial dose THEN inject 1 pen (40 mg) every other week beginning 1 week after initial dose. 3 each 0   ??? hydroCHLOROthiazide (HYDRODIURIL) 25 MG tablet Take 25 mg by mouth daily.      ??? HYDROcodone-acetaminophen (NORCO) 5-325 mg per tablet hydrocodone 5 mg-acetaminophen 325 mg tablet     ??? hydrOXYzine (ATARAX) 25 MG tablet Take 25 mg by mouth Three (3) times a day as needed.      ??? ibuprofen (ADVIL,MOTRIN) 800 MG tablet Take 800 mg by mouth every six (6) hours as needed for  pain.      ??? ipratropium (ATROVENT) 42 mcg (0.06 %) nasal spray 2 sprays into each nostril four (4) times a day.      ??? ivermectin (STROMECTOL) 3 mg Tab Take 9 tablets by mouth and repeat in one week 18 tablet 0   ??? levothyroxine 25 mcg cap Take 50 mg by mouth once.     ??? losartan-hydrochlorothiazide (HYZAAR) 50-12.5 mg per tablet losartan 50 mg-hydrochlorothiazide 12.5 mg tablet ??? melatonin 10 mg Tab Take by mouth.      ??? meloxicam (MOBIC) 15 MG tablet meloxicam 15 mg tablet     ??? methotrexate 2.5 MG tablet TAKE 10 TABLETS ONCE WEEKLY. 120 tablet 0   ??? montelukast (SINGULAIR) 10 mg tablet Take 10 mg by mouth nightly.      ??? neomycin-bacitracin-polymyxin-hydrocortisone (CORTISPORIN) 3.5-400-10,000 mg-unit/g-1% ophthalmic ointment Two (2) times a day.     ??? neomycin-polymyxin-hydrocortisone (CORTISPORIN) 3.5-10,000-1 mg/mL-unit/mL-% otic solution neomycin-polymyxin-hydrocort 3.5 mg/mL-10,000 unit/mL-1 % ear solution     ??? nitroglycerin (NITROSTAT) 0.4 MG SL tablet Place 0.4 mg under the tongue every five (5) minutes as needed for chest pain.     ??? nystatin (MYCOSTATIN) powder      ??? nystatin-triamcinolone (MYCOLOG II) 100,000-0.1 unit/g-% cream Apply topically four (4) times a day. 30 g 3   ??? PARoxetine (PAXIL) 20 MG tablet Take 20 mg by mouth daily.      ??? PNV no.63-iron,carbonyl-FA-dha 27 mg iron- 800 mcg-200 mg cap Take 300 mg by mouth every morning before breakfast.      ??? pramoxine (SARNA SENSITIVE) 1 % Lotn Apply 1 application topically two (2) times a day. 222 mL 6   ??? predniSONE (DELTASONE) 5 MG tablet Take 5 mg by mouth Two (2) times a day.  0   ??? pregabalin (LYRICA) 50 MG capsule Take 50 mg by mouth daily as needed.      ??? promethazine (PHENERGAN) 25 MG tablet promethazine 25 mg tablet     ??? ranitidine (ZANTAC) 150 MG tablet Take 150 mg by mouth Two (2) times a day.     ??? ranolazine (RANEXA) 1,000 mg SR tablet Take 1,000 mg by mouth every morning before breakfast.      ??? secukinumab (COSENTYX) 150 mg/mL PnIj injection Cosentyx Pen 300 mg/2 Pens (150 mg/mL) subcutaneous     ??? silver sulfaDIAZINE (SILVADENE, SSD) 1 % cream      ??? SPIRIVA RESPIMAT 1.25 mcg/actuation Mist USE AS DIRECTED 1 INHALATION EVERY DAY     ??? SYMBICORT 160-4.5 mcg/actuation inhaler INHALE 1 PUFF ORALLY EVERY 12 HOURS, RINSE MOUTH WELL AFTER EACH USELRH  1   ??? traZODone (DESYREL) 100 MG tablet Take 100 mg by mouth nightly.      ??? triamcinolone (KENALOG) 0.1 % ointment APPLY TO AFFECTED AREA TWICE A DAY 454 g 0   ??? triamcinolone (KENALOG) 0.1 % ointment Apply topically Two (2) times a day. 80 g 3   ??? triamcinolone (KENALOG) 0.1 % ointment Apply a thick layer twice a day to psoriasis until smooth, then stop 454 g 3     No current facility-administered medications for this visit.       Allergies   Allergen Reactions   ??? Codeine Itching       Patient Active Problem List   Diagnosis   ??? Atrial fibrillation (CMS-HCC)   ??? Coronary artery disease   ??? Gastroesophageal reflux disease   ??? Hyperlipidemia, unspecified   ??? Hypertension   ??? Obesity, unspecified   ???  Obstructive sleep apnea syndrome   ??? Psoriasis       Reviewed and up to date in Epic.    Appropriateness of Therapy     Is medication and dose appropriate based on diagnosis? Yes    Prescription has been clinically reviewed: Yes    Baseline Quality of Life Assessment      How many days over the past month did your psoriasis  keep you from your normal activities? For example, brushing your teeth or getting up in the morning. 0    Financial Information     Medication Assistance provided: Prior Authorization    Anticipated copay of $4 reviewed with patient. Verified delivery address.    Delivery Information     Scheduled delivery date: Wed, Jan 12    Expected start date: Wed, Jan 12    Medication will be delivered via Same Day Courier to the prescription address in [facility].  This shipment will not require a signature.      Explained the services we provide at [facility] and that each month we would call to set up refills.  Stressed importance of returning phone calls so that we could ensure they receive their medications in time each month.  Informed patient that we should be setting up refills 7-10 days prior to when they will run out of medication.  A pharmacist will reach out to perform a clinical assessment periodically.  Informed patient that a welcome packet and a drug information handout will be sent.      Patient verbalized understanding of the above information as well as how to contact the pharmacy at [phone] option 4 with any questions/concerns.  The pharmacy is open Monday through Friday 8:30am-4:30pm.  A pharmacist is available 24/7 via pager to answer any clinical questions they Posas have.    Patient Specific Needs     - Does the patient have any physical, cognitive, or cultural barriers? No    - Patient prefers to have medications discussed with  Patient     - Is the patient or caregiver able to read and understand education materials at a high school level or above? Yes    - Patient's primary language is  English     - Is the patient high risk? No    - Does the patient require a Care Management Plan? No     - Does the patient require physician intervention or other additional services (i.e. nutrition, smoking cessation, social work)? No      Kassity Woodson A Desiree Lucy [facility] Specialty Pharmacist

## 2020-04-21 NOTE — Unmapped (Signed)
Managing your wound after skin surgery    Please avoid strenuous activity for 48 hours and all heavy exercise for 1 week  If your wound is on your arm, leg, or shoulder, then avoid heavy lifting, straining, or exercise with that limb for at least one week.  If your wound is on the head or neck, avoid bending over if at all possible.  Sleep with your head elevated for 48 hours to reduce the risk of bleeding.  Please don???t smoke for 3 weeks; smoking prevents healthy wound healing  Pain management: Use  600 mg of acetaminophen (Tylenol ) every 4 hours, or medication we prescribed as needed. Pain should decrease steadily for the 1st few days after your surgery.   Possible complications:  Bleeding: a small amount of blood on the bandage is normal.   If there is a significant bleeding into the bandage or beneath the stitches (this will look like swelling and purple discoloration), apply firm pressure  with a cloth or bandage for 20 minutes without interruption. Repeat if bleeding continues. If it persists, please call [phone]. During nonoffice hours use the message of this number to contact the on-call dermatologist or go to the emergency room.  Infection: usually indicated by pain that increases 4 to 6 days after surgery. Mild redness, swelling, soreness are normal, but, with increasing redness, pain, drainage, and swelling, you should contact our office.  Decreased sensation, increased sensitivity, and itching Mousseau last for 18 months.  Scarring: scars mature for one year after surgery. Reducing motion and tension over the wound for the 1st month, as well as using sun protection, reduces scarring.  Bruising is normal around the wound and resolves over 2 to 3 weeks.  For wounds with dissolving stitches:  Please keep the original bandage in place for at least 48 hours. You can remove the large outer layer at that time and keep the small bandage clean and dry for one week or until you return. If the  bandage becomes soiled, wet, or detached, you can reinforce it with tape or add petrolatum  (Vaseline) to the stitches, then place a fresh, clean, nonstick bandage (telfa) with tape or use a non stick Band-Aid.  For wounds with stitches that need to be removed:  Please keep the original bandage in place for at least 48 hours. You can remove the large outer bandage at that time. You Bolduc leave the smaller bandage in place until it falls off by itself, or change it after it gets wet with bathing. To change, apply a layer of petrolatum (Vaseline) over top of the Steri-Strips (thin white stripes) and cover with a nonstick stick bandage (telfa) and  paper tape or use a non stick Band-Aid.  If you???re changing the bandage, you can gently remove any crust with warm water and mild soap using a soft cloth a Q-tip. Do not use triple antibiotic ointment, Neosporin, or hydrogen peroxide.  After the stitches and bandages have been removed: clean daily with water with warm water and gentle soap using a soft cloth. You can continue to use a small bandage and sunscreen for one to 2 months or until fully healed.    If instructed to use vinegar soaks, use a fresh bottle of white vinegar and bottled water to make the soak  Please add 1 cup of white vinegar to a quart of room temperature water.  Soak affected area for 10-20 minutes once or twice a day.

## 2020-04-22 MED FILL — EMPTY CONTAINER: 120 days supply | Qty: 1 | Fill #0

## 2020-04-24 NOTE — Unmapped (Signed)
Final Diagnosis       Date                     Value               Ref Range           Status                04/21/2020                                                       Final             Right shoulder, excision   - Epidermal inclusion cyst [FORMATTING REMOVED]  ----------    Patient notified of benign result. No further treatment needed.

## 2020-05-08 DIAGNOSIS — L409 Psoriasis, unspecified: Principal | ICD-10-CM

## 2020-05-08 MED ORDER — HUMIRA PEN CITRATE FREE 40 MG/0.4 ML
SUBCUTANEOUS | 11 refills | 28.00000 days | Status: CP
Start: 2020-05-08 — End: ?

## 2020-05-08 NOTE — Unmapped (Signed)
Message from [facility] requesting a new rx for Humira Pen.  Pharmacy info added to the patient's chart.

## 2020-05-08 NOTE — Unmapped (Signed)
Addended by: [name] on: 05/08/2020 03:52 PM     Modules accepted: Orders

## 2020-05-08 NOTE — Unmapped (Signed)
Sent script to [facility]

## 2020-05-15 NOTE — Unmapped (Signed)
This patient has been disenrolled from the [facility] specialty pharmacy services due to a pharmacy change. The patient is now filling at [facility] specialty.    Lanney Gins  [facility] Specialty Pharmacist

## 2020-05-16 MED ORDER — FOLIC ACID 1 MG TABLET
ORAL_TABLET | 0 refills | 0.00000 days
Start: 2020-05-16 — End: ?

## 2020-06-12 ENCOUNTER — Ambulatory Visit: Admit: 2020-06-12 | Discharge: 2020-06-13 | Payer: MEDICARE | Attending: Dermatology | Primary: Dermatology

## 2020-06-12 DIAGNOSIS — L409 Psoriasis, unspecified: Principal | ICD-10-CM

## 2020-06-12 DIAGNOSIS — Z79899 Other long term (current) drug therapy: Principal | ICD-10-CM

## 2020-06-12 MED ORDER — FOLIC ACID 1 MG TABLET
ORAL_TABLET | 5 refills | 0.00000 days | Status: CP
Start: 2020-06-12 — End: ?

## 2020-06-12 MED ORDER — HYDROXYZINE HCL 25 MG TABLET
ORAL_TABLET | Freq: Three times a day (TID) | ORAL | 2 refills | 30.00000 days | Status: CP | PRN
Start: 2020-06-12 — End: ?

## 2020-06-12 MED ORDER — METHOTREXATE SODIUM 2.5 MG TABLET
ORAL_TABLET | ORAL | 1 refills | 28.00000 days | Status: CP
Start: 2020-06-12 — End: ?

## 2020-06-20 ENCOUNTER — Other Ambulatory Visit: Payer: Self-pay

## 2020-06-20 ENCOUNTER — Encounter: Payer: Self-pay | Admitting: Emergency Medicine

## 2020-06-20 ENCOUNTER — Emergency Department: Payer: Medicare HMO

## 2020-06-20 ENCOUNTER — Emergency Department
Admission: EM | Admit: 2020-06-20 | Discharge: 2020-06-20 | Disposition: A | Discharge status: Home / Self Care | Payer: Medicare HMO | Attending: Emergency Medicine | Admitting: Emergency Medicine

## 2020-06-20 DIAGNOSIS — J40 Bronchitis, not specified as acute or chronic: Secondary | ICD-10-CM | POA: Insufficient documentation

## 2020-06-20 DIAGNOSIS — I10 Essential (primary) hypertension: Secondary | ICD-10-CM | POA: Diagnosis not present

## 2020-06-20 DIAGNOSIS — Z20822 Contact with and (suspected) exposure to covid-19: Secondary | ICD-10-CM

## 2020-06-20 DIAGNOSIS — E039 Hypothyroidism, unspecified: Secondary | ICD-10-CM | POA: Diagnosis not present

## 2020-06-20 DIAGNOSIS — J029 Acute pharyngitis, unspecified: Secondary | ICD-10-CM | POA: Insufficient documentation

## 2020-06-20 DIAGNOSIS — J45909 Unspecified asthma, uncomplicated: Secondary | ICD-10-CM | POA: Diagnosis not present

## 2020-06-20 DIAGNOSIS — I2510 Atherosclerotic heart disease of native coronary artery without angina pectoris: Secondary | ICD-10-CM | POA: Diagnosis not present

## 2020-06-20 DIAGNOSIS — R0602 Shortness of breath: Secondary | ICD-10-CM | POA: Diagnosis present

## 2020-06-20 LAB — CBC WITH DIFFERENTIAL/PLATELET
Abs Immature Granulocytes: 0.05 10*3/uL (ref 0.00–0.07)
Basophils Absolute: 0.1 10*3/uL (ref 0.0–0.1)
Basophils Relative: 1 %
Eosinophils Absolute: 0.2 10*3/uL (ref 0.0–0.5)
Eosinophils Relative: 2 %
HCT: 42.1 % (ref 36.0–46.0)
Hemoglobin: 13.8 g/dL (ref 12.0–15.0)
Immature Granulocytes: 1 %
Lymphocytes Relative: 14 %
Lymphs Abs: 1.2 10*3/uL (ref 0.7–4.0)
MCH: 28.5 pg (ref 26.0–34.0)
MCHC: 32.8 g/dL (ref 30.0–36.0)
MCV: 86.8 fL (ref 80.0–100.0)
Monocytes Absolute: 0.8 10*3/uL (ref 0.1–1.0)
Monocytes Relative: 9 %
Neutro Abs: 6.6 10*3/uL (ref 1.7–7.7)
Neutrophils Relative %: 73 %
Platelets: 289 10*3/uL (ref 150–400)
RBC: 4.85 MIL/uL (ref 3.87–5.11)
RDW: 13.8 % (ref 11.5–15.5)
WBC: 8.9 10*3/uL (ref 4.0–10.5)
nRBC: 0 % (ref 0.0–0.2)

## 2020-06-20 LAB — BASIC METABOLIC PANEL
Anion gap: 8 (ref 5–15)
BUN: 15 mg/dL (ref 8–23)
CO2: 27 mmol/L (ref 22–32)
Calcium: 8.8 mg/dL — ABNORMAL LOW (ref 8.9–10.3)
Chloride: 101 mmol/L (ref 98–111)
Creatinine, Ser: 0.96 mg/dL (ref 0.44–1.00)
GFR, Estimated: >60 mL/min (ref >60)
Glucose, Bld: 127 mg/dL — ABNORMAL HIGH (ref 70–99)
Potassium: 3.7 mmol/L (ref 3.5–5.1)
Sodium: 136 mmol/L (ref 135–145)

## 2020-06-20 LAB — TROPONIN I (HIGH SENSITIVITY): Troponin I (High Sensitivity): 15 ng/L (ref <18)

## 2020-06-20 LAB — RESP PANEL BY RT-PCR (FLU A&B, COVID) ARPGX2
Influenza A by PCR: NEGATIVE
Influenza B by PCR: NEGATIVE
SARS Coronavirus 2 by RT PCR: NEGATIVE

## 2020-06-20 MED ORDER — ACETAMINOPHEN 500 MG PO TABS
1000.0000 mg | ORAL_TABLET | Freq: Once | ORAL | Status: AC
  Administered 2020-06-20: 1000 mg via ORAL
  Filled 2020-06-20: qty 2

## 2020-06-20 NOTE — ED Provider Notes (Signed)
[facility] Emergency Department Provider Note  ____________________________________________   Event Date/Time   First MD Initiated Contact with Patient 06/20/20 [phone]     (approximate)  I have reviewed the triage vital signs and the nursing notes.   HISTORY  Chief Complaint Headache, Chills, Cough, Generalized Body Aches, and Sore Throat   HPI Patricia Walker is a 75 y.o. female with a past medical history of HTN, HDL, CAD, depression, anxiety, arthritis, obesity, A. fib on Eliquis who presents for assessment of 2 to 3 days of fevers, chills, shortness of breath, chest tightness and productive cough with green sputum.  Chest endorses mild headache but denies any earache.  Endorses mild sore throat.  Denies any nausea, vomiting, diarrhea, dysuria, abdominal pain, back pain, rash or extremity pain.  No recent falls or injuries.  Endorses very remote tobacco abuse but no current tobacco abuse EtOH use or illicit drug use.  No other acute concerns at this time.         Past Medical History:  Diagnosis Date   Anxiety    Arthritis    Asthma    Coronary artery disease    mild, nonobstructive   Depression    Dyspnea    on exertion   Dysrhythmia    Atrial Fibrillation   GERD (gastroesophageal reflux disease)    Heart murmur    Hyperlipidemia    Hypertension    Hypothyroidism    MI, old 2016   nonobstructive CAD by Cath   Morbid obesity ([place])    Persistent atrial fibrillation ([place])    Pneumonia 06/2018   and RSV   Psoriasis    Sleep apnea    compliant with CPAP   Thyroid disease     Patient Active Problem List   Diagnosis Date Noted   Pneumonia 06/15/2018   Multifocal pneumonia 06/14/2018   Unstable angina ([place]) 06/13/2017   New onset of headaches 06/12/2017   Photophobia of right eye 06/12/2017   Osteoarthritis of knee 11/17/2016   Coronary artery disease 09/16/2016   History of cardioversion 09/16/2016    Hyperlipidemia, unspecified 09/16/2016   Hypertension 09/16/2016   Obesity, unspecified 09/16/2016   Atrial fibrillation ([place]) 09/08/2016   Cardiac arrhythmia 09/09/2015   Gastroesophageal reflux disease 09/09/2015   Obstructive sleep apnea syndrome 09/09/2015   Psoriasis 09/09/2015    Past Surgical History:  Procedure Laterality Date   ARTERY BIOPSY Right 07/21/2017   Procedure: BIOPSY TEMPORAL ARTERY;  Surgeon: [name], MD;  Location: [facility] ORS;  Service: Vascular;  Laterality: Right;   CARDIAC CATHETERIZATION     CARDIOVERSION Right 09/01/2016   Procedure: Cardioversion;  Surgeon: [name], MD;  Location: [facility];  Service: Cardiovascular;  Laterality: Right;   CARDIOVERSION N/A 09/09/2016   Procedure: Cardioversion;  Surgeon: [name], MD;  Location: [facility];  Service: Cardiovascular;  Laterality: N/A;   CATARACT EXTRACTION W/PHACO Right 03/26/2019   Procedure: CATARACT EXTRACTION PHACO AND INTRAOCULAR LENS PLACEMENT ([place]) RIGHT 6.45, 00:39.9;  Surgeon: Birder Robson, MD;  Location: [place];  Service: Ophthalmology;  Laterality: Right;   CATARACT EXTRACTION W/PHACO Left 04/16/2019   Procedure: CATARACT EXTRACTION PHACO AND INTRAOCULAR LENS PLACEMENT (IOC) LEFT;   3.14, 00:24.9;  Surgeon: Birder Robson, MD;  Location: [place];  Service: Ophthalmology;  Laterality: Left;  sleep apnea-CPAP   COLONOSCOPY WITH PROPOFOL N/A 08/28/2019   Procedure: COLONOSCOPY WITH PROPOFOL;  Surgeon: Toledo, Benay Pike, MD;  Location: [facility];  Service: Gastroenterology;  Laterality: N/A;   ELECTROPHYSIOLOGIC STUDY N/A 02/01/2016   Procedure: CARDIOVERSION;  Surgeon: [name], MD;  Location: [facility];  Service: Cardiovascular;  Laterality: N/A;   ESOPHAGEAL DILATION     ESOPHAGOGASTRODUODENOSCOPY (EGD) WITH PROPOFOL N/A 02/06/2017   Procedure: ESOPHAGOGASTRODUODENOSCOPY (EGD) WITH PROPOFOL;  Surgeon: [name], MD;  Location:  [facility] ENDOSCOPY;  Service: Gastroenterology;  Laterality: N/A;   ESOPHAGOGASTRODUODENOSCOPY (EGD) WITH PROPOFOL N/A 08/28/2019   Procedure: ESOPHAGOGASTRODUODENOSCOPY (EGD) WITH PROPOFOL;  Surgeon: Toledo, Benay Pike, MD;  Location: [facility] ENDOSCOPY;  Service: Gastroenterology;  Laterality: N/A;   EUS N/A 02/16/2017   Procedure: FULL UPPER ENDOSCOPIC ULTRASOUND (EUS) RADIAL;  Surgeon: [name], MD;  Location: [facility] ENDOSCOPY;  Service: Gastroenterology;  Laterality: N/A;   LEFT HEART CATH AND CORONARY ANGIOGRAPHY N/A 09/08/2016   Procedure: Left Heart Cath and Coronary Angiography;  Surgeon: [name], MD;  Location: [place] CV [facility];  Service: Cardiovascular;  Laterality: N/A;   US ECHOCARDIOGRAPHY      Prior to Admission medications   Medication Sig Start Date End Date Taking? Authorizing Provider  albuterol (PROVENTIL HFA;VENTOLIN HFA) 108 (90 Base) MCG/ACT inhaler Inhale 2 puffs into the lungs every 6 (six) hours as needed for wheezing or shortness of breath.    [provider]  apixaban (ELIQUIS) 5 MG TABS tablet Take 5 mg by mouth 2 (two) times daily.    [provider]  atorvastatin (LIPITOR) 20 MG tablet Take 20 mg by mouth every evening. 08/25/16   [provider]  azelastine (ASTELIN) 0.1 % nasal spray Place 2 sprays into both nostrils 2 (two) times daily as needed for rhinitis or allergies.  10/13/16   [provider]  baclofen (LIORESAL) 10 MG tablet Take 10 mg by mouth 2 (two) times daily. 06/13/17   [provider]  benzonatate (TESSALON) 100 MG capsule Take 1 capsule (100 mg total) by mouth 3 (three) times daily as needed for cough. 06/18/18   Hillary Bow, MD  budesonide-formoterol (SYMBICORT) 160-4.5 MCG/ACT inhaler Inhale 2 puffs into the lungs 2 (two) times daily.     [provider]  doxepin (SINEQUAN) 25 MG capsule Take 25 mg by mouth at bedtime.    [provider]  esomeprazole (NEXIUM) 40 MG capsule  Take 40 mg by mouth daily. 07/01/17   [provider]  folic acid (FOLVITE) 1 MG tablet Take 1 mg by mouth daily.    [provider]  furosemide (LASIX) 20 MG tablet Take 20 mg by mouth daily as needed for fluid. 08/24/16   [provider]  gabapentin (NEURONTIN) 300 MG capsule Take 300 mg by mouth 2 (two) times daily.    [provider]  levofloxacin (LEVAQUIN) 500 MG tablet Take 1 tablet (500 mg total) by mouth daily. 06/18/18   Hillary Bow, MD  levothyroxine (SYNTHROID, LEVOTHROID) 50 MCG tablet Take 50 mcg by mouth daily before breakfast.    [provider]  methotrexate (RHEUMATREX) 2.5 MG tablet Take 10 mg by mouth once a week. Caution:Chemotherapy. Protect from light.    [provider]  montelukast (SINGULAIR) 10 MG tablet Take 10 mg by mouth at bedtime.  05/26/17   [provider]  PARoxetine (PAXIL) 20 MG tablet Take 20 mg by mouth daily.    [provider]  predniSONE (STERAPRED UNI-PAK 21 TAB) 10 MG (21) TBPK tablet 6 tabs day 1 and taper 10 mg a day - 6 days 06/18/18   Hillary Bow, MD  predniSONE (  STERAPRED UNI-PAK 21 TAB) 10 MG (21) TBPK tablet Per packaging instructions 06/25/18   Nance Pear, MD  pregabalin (LYRICA) 50 MG capsule Take 50 mg by mouth daily as needed for pain.    [provider]  ranitidine (ZANTAC) 150 MG tablet Take 150 mg by mouth 2 (two) times daily. 04/27/17   [provider]  SKYRIZI, 150 MG DOSE, 75 MG/0.83ML PSKT Inject 1 Dose into the skin every 3 (three) months. 05/09/18   [provider]  traMADol (ULTRAM) 50 MG tablet Take 2 tablets (100 mg total) by mouth every 6 (six) hours as needed for moderate pain or severe pain. 08/23/19   Hinda Kehr, MD  triamcinolone ointment (KENALOG) 0.1 % Apply 1 application topically as directed. 05/09/18   [provider]    Allergies Codeine and Other  Family History  Problem Relation Age of Onset   Breast  cancer Paternal Aunt    Other Father    Heart attack Father     Social History Social History   Tobacco Use   Smoking status: Former Smoker    Types: Cigarettes    Quit date: 02/01/2013    Years since quitting: 7.3   Smokeless tobacco: Never Used  Vaping Use   Vaping Use: Never used  Substance Use Topics   Alcohol use: No   Drug use: No    Review of Systems  Review of Systems  Constitutional: Positive for chills, fever and malaise/fatigue.  HENT: Negative for sore throat.   Eyes: Negative for pain.  Respiratory: Positive for cough and shortness of breath. Negative for stridor.   Cardiovascular: Positive for chest pain.  Gastrointestinal: Negative for vomiting.  Skin: Negative for rash.  Neurological: Positive for headaches. Negative for seizures and loss of consciousness.  Psychiatric/Behavioral: Negative for suicidal ideas.  All other systems reviewed and are negative.     ____________________________________________   PHYSICAL EXAM:  VITAL SIGNS: ED Triage Vitals  Enc Vitals Group     BP 06/20/20 0731 (!) 152/57     Pulse Rate 06/20/20 0731 82     Resp 06/20/20 0731 20     Temp 06/20/20 0734 98.5 F (36.9 C)     Temp Source 06/20/20 0734 Oral     SpO2 06/20/20 0731 97 %     Weight 06/20/20 0732 (!) 310 lb (140.6 kg)     Height 06/20/20 0732 5\' 4"  (1.626 m)     Head Circumference --      Peak Flow --      Pain Score 06/20/20 0732 7     Pain Loc --      Pain Edu? --      Excl. in [place]? --    Vitals:   06/20/20 0734 06/20/20 1100  BP:  (!) 150/56  Pulse:  76  Resp:  (!) 22  Temp: 98.5 F (36.9 C)   SpO2:  95%   Physical Exam Vitals and nursing note reviewed.  Constitutional:      General: She is not in acute distress.    Appearance: She is well-developed. She is obese.  HENT:     Head: Normocephalic and atraumatic.     Right Ear: External ear normal.     Left Ear: External ear normal.     Nose: Nose normal.  Eyes:      Conjunctiva/sclera: Conjunctivae normal.  Cardiovascular:     Rate and Rhythm: Normal rate. Rhythm irregular.     Heart sounds: No murmur heard.  Pulmonary:     Effort: Pulmonary effort is normal. No respiratory distress.     Breath sounds: Normal breath sounds.  Abdominal:     Palpations: Abdomen is soft.     Tenderness: There is no abdominal tenderness.  Musculoskeletal:     Cervical back: Neck supple.  Skin:    General: Skin is warm and dry.     Capillary Refill: Capillary refill takes less than 2 seconds.  Neurological:     Mental Status: She is alert and oriented to person, place, and time.  Psychiatric:        Mood and Affect: Mood normal.     Lungs are clear bilaterally.  Patient's respiratory rate is 18 on my assessment.  Her SPO2 is 97%. ____________________________________________   LABS (all labs ordered are listed, but only abnormal results are displayed)  Labs Reviewed  BASIC METABOLIC PANEL - Abnormal; Notable for the following components:      Result Value   Glucose, Bld 127 (*)    Calcium 8.8 (*)    All other components within normal limits  RESP PANEL BY RT-PCR (FLU A&B, COVID) ARPGX2  CBC WITH DIFFERENTIAL/PLATELET  TROPONIN I (HIGH SENSITIVITY)  TROPONIN I (HIGH SENSITIVITY)   ____________________________________________  EKG  A. fib with a ventricular rate of 78 with some nonspecific ST changes in aVL and 1 without other clear evidence of acute ischemia or other significant underlying arrhythmia. ____________________________________________  RADIOLOGY  ED MD interpretation: Mild cardiomegaly without focal consolidation, overt edema, effusion, pneumothorax or other acute thoracic process.  Official radiology report(s): DG Chest 2 View  Result Date: 06/20/2020 CLINICAL DATA:  Cough and chest pain EXAM: CHEST - 2 VIEW COMPARISON:  March 23, 2020 FINDINGS: Lungs are clear. Heart is slightly enlarged with pulmonary vascularity normal. There is  aortic atherosclerosis. No adenopathy. No bone lesions. IMPRESSION: Heart mildly enlarged. Lungs clear. Aortic Atherosclerosis (ICD10-I70.0). Electronically Signed   By: Lowella Grip III M.D.   On: 06/20/2020 08:17    ____________________________________________   PROCEDURES  Procedure(s) performed (including Critical Care):  .1-3 Lead EKG Interpretation Performed by: Lucrezia Starch, MD Authorized by: Lucrezia Starch, MD     Interpretation: normal     ECG rate assessment: normal     Rhythm: sinus rhythm     Ectopy: none       ____________________________________________   INITIAL IMPRESSION / ASSESSMENT AND PLAN / ED COURSE        Patient presents with above stage III exam for assessment of cough, sore throat myalgias and fever over the last couple days.  On arrival patient is afebrile and slightly productive otherwise stable vital signs on room air.  On exam is old with posterior oropharyngeal erythema but no other findings to suggest other deep space infection of the head or neck.  Lungs are clear bilaterally and she does not appear dehydrated.  Chest x-ray shows no overt edema, full consolidation, effusion or other acute derangement.  ECG has an isolated nonspecific change but given nonelevated troponin obtained.  There is after symptom onset of low suspicion for ACS or myocarditis.  CBC and BMP are unremarkable.  Low suspicion for bacterial pneumonia sepsis or other Mi life-threatening pathology at this time.  Low suspicion for PE as patient is anticoagulated on Eliquis.  Impression is likely viral bronchitis.  Covid and influenza PCR sent.  Patient discharged stable condition.  Strict precautions advised and discussed.       ____________________________________________   FINAL CLINICAL  IMPRESSION(S) / ED DIAGNOSES  Final diagnoses:  Bronchitis  Person under investigation for COVID-19    Medications  acetaminophen (TYLENOL) tablet 1,000 mg (1,000 mg Oral  Given 06/20/20 1034)     ED Discharge Orders    None       Note:  This document was prepared using Dragon voice recognition software and [employer] include unintentional dictation errors.   Lucrezia Starch, MD 06/20/20 1140

## 2020-06-20 NOTE — ED Triage Notes (Signed)
Pt reports cough is productive with thick green sputum.

## 2020-06-20 NOTE — ED Notes (Signed)
Patient given call bell, remote, and warm blanket

## 2020-06-20 NOTE — ED Triage Notes (Signed)
Pt reports chills, headache, cough and bodyaches for the last few days. Pt denies fevers. Pt reports no exposures.

## 2020-06-20 NOTE — ED Notes (Signed)
Pt verbalized understanding of d/c instructions at this time. Pt denied further questions at this time. Pt assisted to ED entrance at this time via wheelchair, NAD noted, RR even and unlabored at this time.

## 2020-07-05 DIAGNOSIS — L409 Psoriasis, unspecified: Principal | ICD-10-CM

## 2020-07-05 MED ORDER — HYDROXYZINE HCL 25 MG TABLET
ORAL_TABLET | 1 refills | 0.00000 days
Start: 2020-07-05 — End: ?

## 2020-09-08 ENCOUNTER — Emergency Department: Payer: Medicare HMO

## 2020-09-08 ENCOUNTER — Other Ambulatory Visit: Payer: Self-pay | Admitting: Physical Medicine & Rehabilitation

## 2020-09-08 ENCOUNTER — Other Ambulatory Visit: Payer: Self-pay

## 2020-09-08 ENCOUNTER — Emergency Department
Admission: EM | Admit: 2020-09-08 | Discharge: 2020-09-08 | Disposition: A | Discharge status: Home / Self Care | Payer: Medicare HMO | Attending: Emergency Medicine | Admitting: Emergency Medicine

## 2020-09-08 ENCOUNTER — Other Ambulatory Visit (HOSPITAL_COMMUNITY): Payer: Self-pay | Admitting: Physical Medicine & Rehabilitation

## 2020-09-08 DIAGNOSIS — M79604 Pain in right leg: Secondary | ICD-10-CM | POA: Insufficient documentation

## 2020-09-08 DIAGNOSIS — M5441 Lumbago with sciatica, right side: Secondary | ICD-10-CM

## 2020-09-08 DIAGNOSIS — E039 Hypothyroidism, unspecified: Secondary | ICD-10-CM | POA: Insufficient documentation

## 2020-09-08 DIAGNOSIS — N390 Urinary tract infection, site not specified: Secondary | ICD-10-CM | POA: Insufficient documentation

## 2020-09-08 DIAGNOSIS — Z87891 Personal history of nicotine dependence: Secondary | ICD-10-CM | POA: Diagnosis not present

## 2020-09-08 DIAGNOSIS — J45909 Unspecified asthma, uncomplicated: Secondary | ICD-10-CM | POA: Insufficient documentation

## 2020-09-08 DIAGNOSIS — I4819 Other persistent atrial fibrillation: Secondary | ICD-10-CM | POA: Diagnosis not present

## 2020-09-08 DIAGNOSIS — Z7951 Long term (current) use of inhaled steroids: Secondary | ICD-10-CM | POA: Diagnosis not present

## 2020-09-08 DIAGNOSIS — I2510 Atherosclerotic heart disease of native coronary artery without angina pectoris: Secondary | ICD-10-CM | POA: Insufficient documentation

## 2020-09-08 DIAGNOSIS — M79605 Pain in left leg: Secondary | ICD-10-CM | POA: Diagnosis not present

## 2020-09-08 DIAGNOSIS — Z7901 Long term (current) use of anticoagulants: Secondary | ICD-10-CM | POA: Insufficient documentation

## 2020-09-08 DIAGNOSIS — Z79899 Other long term (current) drug therapy: Secondary | ICD-10-CM | POA: Insufficient documentation

## 2020-09-08 DIAGNOSIS — G8929 Other chronic pain: Secondary | ICD-10-CM

## 2020-09-08 LAB — CBC WITH DIFFERENTIAL/PLATELET
Abs Immature Granulocytes: 0.03 10*3/uL (ref 0.00–0.07)
Basophils Absolute: 0.1 10*3/uL (ref 0.0–0.1)
Basophils Relative: 1 %
Eosinophils Absolute: 0.1 10*3/uL (ref 0.0–0.5)
Eosinophils Relative: 1 %
HCT: 45 % (ref 36.0–46.0)
Hemoglobin: 15 g/dL (ref 12.0–15.0)
Immature Granulocytes: 0 %
Lymphocytes Relative: 15 %
Lymphs Abs: 1.2 10*3/uL (ref 0.7–4.0)
MCH: 27.8 pg (ref 26.0–34.0)
MCHC: 33.3 g/dL (ref 30.0–36.0)
MCV: 83.5 fL (ref 80.0–100.0)
Monocytes Absolute: 0.7 10*3/uL (ref 0.1–1.0)
Monocytes Relative: 9 %
Neutro Abs: 6.2 10*3/uL (ref 1.7–7.7)
Neutrophils Relative %: 74 %
Platelets: 281 10*3/uL (ref 150–400)
RBC: 5.39 MIL/uL — ABNORMAL HIGH (ref 3.87–5.11)
RDW: 13.2 % (ref 11.5–15.5)
WBC: 8.4 10*3/uL (ref 4.0–10.5)
nRBC: 0 % (ref 0.0–0.2)

## 2020-09-08 LAB — URINALYSIS, COMPLETE (UACMP) WITH MICROSCOPIC
Bilirubin Urine: NEGATIVE
Glucose, UA: NEGATIVE mg/dL
Hgb urine dipstick: NEGATIVE
Ketones, ur: 5 mg/dL — AB
Nitrite: NEGATIVE
Protein, ur: 30 mg/dL — AB
Specific Gravity, Urine: 1.026 (ref 1.005–1.030)
WBC, UA: >50 WBC/hpf — ABNORMAL HIGH (ref 0–5)
pH: 5 (ref 5.0–8.0)

## 2020-09-08 LAB — COMPREHENSIVE METABOLIC PANEL
ALT: 25 U/L (ref 0–44)
AST: 27 U/L (ref 15–41)
Albumin: 3.8 g/dL (ref 3.5–5.0)
Alkaline Phosphatase: 71 U/L (ref 38–126)
Anion gap: 12 (ref 5–15)
BUN: 22 mg/dL (ref 8–23)
CO2: 24 mmol/L (ref 22–32)
Calcium: 9.4 mg/dL (ref 8.9–10.3)
Chloride: 100 mmol/L (ref 98–111)
Creatinine, Ser: 1.17 mg/dL — ABNORMAL HIGH (ref 0.44–1.00)
GFR, Estimated: 49 mL/min — ABNORMAL LOW (ref >60)
Glucose, Bld: 108 mg/dL — ABNORMAL HIGH (ref 70–99)
Potassium: 4 mmol/L (ref 3.5–5.1)
Sodium: 136 mmol/L (ref 135–145)
Total Bilirubin: 1 mg/dL (ref 0.3–1.2)
Total Protein: 7 g/dL (ref 6.5–8.1)

## 2020-09-08 LAB — MAGNESIUM: Magnesium: 1.7 mg/dL (ref 1.7–2.4)

## 2020-09-08 LAB — CK: Total CK: 83 U/L (ref 38–234)

## 2020-09-08 MED ORDER — CEPHALEXIN 500 MG PO CAPS: 500.0000 mg | ORAL_CAPSULE | Freq: Two times a day (BID) | ORAL | 0 refills | Status: AC

## 2020-09-08 MED ORDER — ACETAMINOPHEN 325 MG PO TABS
650.0000 mg | ORAL_TABLET | Freq: Once | ORAL | Status: AC
  Administered 2020-09-08: 650 mg via ORAL
  Filled 2020-09-08: qty 2

## 2020-09-08 NOTE — Discharge Instructions (Addendum)
There were no signs of blood clots in your feet had good pulses in them.  Your MRI is as below.  You should discuss this with your doctor who is managing this for you to decide the next steps.  We are also treating you for a UTI.  Return to the ER for fevers, worsening pain or any other concern   1. Slight increase in size of left subarticular disc protrusion at  L3-L4 with narrowing of the left lateral recess and mass effect on  the left L4 nerve root.  2. No central spinal canal or neural foraminal stenosis.

## 2020-09-08 NOTE — ED Triage Notes (Signed)
Pt comes with c/o bilateral leg pain that started last Friday. Pt states throbbing and aching pain.  Pt denies any injuries and recent trips.

## 2020-09-08 NOTE — ED Provider Notes (Signed)
[facility] Emergency Department Provider Note  ____________________________________________   Event Date/Time   First MD Initiated Contact with Patient 09/08/20 1721     (approximate)  I have reviewed the triage vital signs and the nursing notes.   HISTORY  Chief Complaint Leg Pain    HPI Patricia Walker is a 75 y.o. female with A. fib on Eliquis who comes in for leg pain.  Patient reports having bilateral leg pain since Friday.  Patient states the pain is constant, slightly improved with tramadol prescribed by a doctor, worse with laying down.  She denies any weakness in the legs, saddle anesthesia, stool or bladder incontinence.  She does report chronic back pain and has plan for an MRI tomorrow.  She states the pain is worse as it shoots down the side of her right leg from her SI joint down.  On review of records patient has a history of sciatica on the right.  Patient reports having a procedure on her lumbar area a few months ago's to try to help with the pain but denies any recent procedure in the last few weeks.  Patient's primary doctor has an MRI planned for tomorrow of her lumbar spine but given she reported that her feet symptoms felt cold she was told to go to the ER to make sure there is no signs of blood clots.  While was talking to patient she stated that she felt like she was in Afib but reports normally always being in Afib.           Past Medical History:  Diagnosis Date  . Anxiety   . Arthritis   . Asthma   . Coronary artery disease    mild, nonobstructive  . Depression   . Dyspnea    on exertion  . Dysrhythmia    Atrial Fibrillation  . GERD (gastroesophageal reflux disease)   . Heart murmur   . Hyperlipidemia   . Hypertension   . Hypothyroidism   . MI, old 2016   nonobstructive CAD by Cath  . Morbid obesity ([place])   . Persistent atrial fibrillation ([place])   . Pneumonia 06/2018   and RSV  . Psoriasis   . Sleep apnea     compliant with CPAP  . Thyroid disease     Patient Active Problem List   Diagnosis Date Noted  . Pneumonia 06/15/2018  . Multifocal pneumonia 06/14/2018  . Unstable angina ([place]) 06/13/2017  . New onset of headaches 06/12/2017  . Photophobia of right eye 06/12/2017  . Osteoarthritis of knee 11/17/2016  . Coronary artery disease 09/16/2016  . History of cardioversion 09/16/2016  . Hyperlipidemia, unspecified 09/16/2016  . Hypertension 09/16/2016  . Obesity, unspecified 09/16/2016  . Atrial fibrillation ([place]) 09/08/2016  . Cardiac arrhythmia 09/09/2015  . Gastroesophageal reflux disease 09/09/2015  . Obstructive sleep apnea syndrome 09/09/2015  . Psoriasis 09/09/2015    Past Surgical History:  Procedure Laterality Date  . ARTERY BIOPSY Right 07/21/2017   Procedure: BIOPSY TEMPORAL ARTERY;  Surgeon: [name], MD;  Location: [facility] ORS;  Service: Vascular;  Laterality: Right;  . CARDIAC CATHETERIZATION    . CARDIOVERSION Right 09/01/2016   Procedure: Cardioversion;  Surgeon: [name], MD;  Location: [facility] OR[facility];  Service: Cardiovascular;  Laterality: Right;  . CARDIOVERSION N/A 09/09/2016   Procedure: Cardioversion;  Surgeon: [name], MD;  Location: [facility] OR[facility];  Service: Cardiovascular;  Laterality: N/A;  . CATARACT EXTRACTION W/PHACO Right 03/26/2019   Procedure:  CATARACT EXTRACTION PHACO AND INTRAOCULAR LENS PLACEMENT (IOC) RIGHT 6.45, 00:39.9;  Surgeon: Birder Robson, MD;  Location: [place];  Service: Ophthalmology;  Laterality: Right;  . CATARACT EXTRACTION W/PHACO Left 04/16/2019   Procedure: CATARACT EXTRACTION PHACO AND INTRAOCULAR LENS PLACEMENT (IOC) LEFT;   3.14, 00:24.9;  Surgeon: Birder Robson, MD;  Location: [place];  Service: Ophthalmology;  Laterality: Left;  sleep apnea-CPAP  . COLONOSCOPY WITH PROPOFOL N/A 08/28/2019   Procedure: COLONOSCOPY WITH PROPOFOL;  Surgeon: Toledo, Benay Pike, MD;  Location: [facility]OSCOPY;  Service:  Gastroenterology;  Laterality: N/A;  . ELECTROPHYSIOLOGIC STUDY N/A 02/01/2016   Procedure: CARDIOVERSION;  Surgeon: [name], MD;  Location: [facility];  Service: Cardiovascular;  Laterality: N/A;  . ESOPHAGEAL DILATION    . ESOPHAGOGASTRODUODENOSCOPY (EGD) WITH PROPOFOL N/A 02/06/2017   Procedure: ESOPHAGOGASTRODUODENOSCOPY (EGD) WITH PROPOFOL;  Surgeon: [name], MD;  Location: [facility] ENDOSCOPY;  Service: Gastroenterology;  Laterality: N/A;  . ESOPHAGOGASTRODUODENOSCOPY (EGD) WITH PROPOFOL N/A 08/28/2019   Procedure: ESOPHAGOGASTRODUODENOSCOPY (EGD) WITH PROPOFOL;  Surgeon: Toledo, Benay Pike, MD;  Location: [facility] ENDOSCOPY;  Service: Gastroenterology;  Laterality: N/A;  . EUS N/A 02/16/2017   Procedure: FULL UPPER ENDOSCOPIC ULTRASOUND (EUS) RADIAL;  Surgeon: [name], MD;  Location: [facility] ENDOS[facility];  Service: Gastroenterology;  Laterality: N/A;  . LEFT HEART CATH AND CORONARY ANGIOGRAPHY N/A 09/08/2016   Procedure: Left Heart Cath and Coronary Angiography;  Surgeon: [name], MD;  Location: [place] [facility];  Service: Cardiovascular;  Laterality: N/A;  . US ECHOCARDIOGRAPHY      Prior to Admission medications   Medication Sig Start Date End Date Taking? Authorizing Provider  albuterol (PROVENTIL HFA;VENTOLIN HFA) 108 (90 Base) MCG/ACT inhaler Inhale 2 puffs into the lungs every 6 (six) hours as needed for wheezing or shortness of breath.    [provider]  apixaban (ELIQUIS) 5 MG TABS tablet Take 5 mg by mouth 2 (two) times daily.    [provider]  atorvastatin (LIPITOR) 20 MG tablet Take 20 mg by mouth every evening. 08/25/16   [provider]  azelastine (ASTELIN) 0.1 % nasal spray Place 2 sprays into both nostrils 2 (two) times daily as needed for rhinitis or allergies.  10/13/16   [provider]  baclofen (LIORESAL) 10 MG tablet Take 10 mg by mouth 2 (two) times daily. 06/13/17   [provider]  benzonatate (TESSALON) 100  MG capsule Take 1 capsule (100 mg total) by mouth 3 (three) times daily as needed for cough. 06/18/18   Hillary Bow, MD  budesonide-formoterol (SYMBICORT) 160-4.5 MCG/ACT inhaler Inhale 2 puffs into the lungs 2 (two) times daily.     [provider]  doxepin (SINEQUAN) 25 MG capsule Take 25 mg by mouth at bedtime.    [provider]  esomeprazole (NEXIUM) 40 MG capsule Take 40 mg by mouth daily. 07/01/17   [provider]  folic acid (FOLVITE) 1 MG tablet Take 1 mg by mouth daily.    [provider]  furosemide (LASIX) 20 MG tablet Take 20 mg by mouth daily as needed for fluid. 08/24/16   [provider]  gabapentin (NEURONTIN) 300 MG capsule Take 300 mg by mouth 2 (two) times daily.    [provider]  levofloxacin (LEVAQUIN) 500 MG tablet Take 1 tablet (500 mg total) by mouth daily. 06/18/18   Hillary Bow, MD  levothyroxine (SYNTHROID, LEVOTHROID) 50 MCG tablet Take 50 mcg by mouth daily before breakfast.    [provider]  methotrexate (RHEUMATREX) 2.5 MG tablet Take 10 mg by mouth once a week. Caution:Chemotherapy. Protect from light.    [provider]  montelukast (SINGULAIR) 10 MG tablet Take 10 mg by mouth at bedtime.  05/26/17   [provider]  PARoxetine (PAXIL) 20 MG tablet Take 20 mg by mouth daily.    [provider]  predniSONE (STERAPRED UNI-PAK 21 TAB) 10 MG (21) TBPK tablet 6 tabs day 1 and taper 10 mg a day - 6 days 06/18/18   Hillary Bow, MD  predniSONE (STERAPRED UNI-PAK 21 TAB) 10 MG (21) TBPK tablet Per packaging instructions 06/25/18   Nance Pear, MD  pregabalin (LYRICA) 50 MG capsule Take 50 mg by mouth daily as needed for pain.    [provider]  ranitidine (ZANTAC) 150 MG tablet Take 150 mg by mouth 2 (two) times daily. 04/27/17   [provider]  SKYRIZI, 150 MG DOSE, 75 MG/0.83ML PSKT Inject 1 Dose into the skin every 3 (three) months. 05/09/18   [provider]  traMADol (ULTRAM) 50 MG tablet Take 2 tablets (100 mg total) by mouth every 6 (six) hours as needed for moderate pain or severe pain. 08/23/19   Hinda Kehr, MD  triamcinolone ointment (KENALOG) 0.1 % Apply 1 application topically as directed. 05/09/18   [provider]    Allergies Codeine and Other  Family History  Problem Relation Age of Onset  . Breast cancer Paternal Aunt   . Other Father   . Heart attack Father     Social History Social History   Tobacco Use  . Smoking status: Former Smoker    Types: Cigarettes    Quit date: 02/01/2013    Years since quitting: 7.6  . Smokeless tobacco: Never Used  Vaping Use  . Vaping Use: Never used  Substance Use Topics  . Alcohol use: No  . Drug use: No      Review of Systems Constitutional: No fever/chills Eyes: No visual changes. ENT: No sore throat. Cardiovascular: Denies chest pain.  Feels like she is in afib  Respiratory: Denies shortness of breath. Gastrointestinal: No abdominal pain.  No nausea, no vomiting.  No diarrhea.  No constipation. Genitourinary: Negative for dysuria. Musculoskeletal: Negative for back pain.  Leg pain Skin: Negative for rash. Neurological: Negative for headaches, focal weakness or numbness. All other ROS negative ____________________________________________   PHYSICAL EXAM:  VITAL SIGNS: ED Triage Vitals  Enc Vitals Group     BP 09/08/20 1710 (!) 154/105     Pulse Rate 09/08/20 1710 81     Resp 09/08/20 1710 18     Temp 09/08/20 1710 98 F (36.7 C)     Temp src --      SpO2 09/08/20 1710 96 %     Weight --      Height --      Head Circumference --      Peak Flow --      Pain Score 09/08/20 1711 10     Pain Loc --      Pain Edu? --      Excl. in [place]? --     Constitutional: Alert and oriented. Well appearing and in no acute distress. Eyes: Conjunctivae are normal. EOMI. Head: Atraumatic. Nose: No congestion/rhinnorhea. Mouth/Throat: Mucous  membranes are moist.   Neck: No stridor. Trachea Midline. FROM Cardiovascular:normal rate, irregular, Grossly normal heart sounds.  Good peripheral circulation. Respiratory: Normal respiratory effort.  No retractions. Lungs CTAB.  Gastrointestinal: Soft and nontender. No distention. No abdominal bruits.  Musculoskeletal: 2+ pulses bilaterally with warm well-perfused feet.  Tenderness with even light pressure onto her bilateral legs.  Sensation intact bilaterally.  Able to lift both legs up off the bed but reports pain in her hip when doing so.  Patient still able to ambulate. Back: Mild L-spine tenderness and bilateral flank tenderness which is chronic per patient has been going on for months. Neurologic:  Normal speech and language. No gross focal neurologic deficits are appreciated.  Skin:  Skin is warm, dry and intact. No rash noted. Psychiatric: Mood and affect are normal. Speech and behavior are normal. GU: Deferred   ____________________________________________   LABS (all labs ordered are listed, but only abnormal results are displayed)  Labs Reviewed  CBC WITH DIFFERENTIAL/PLATELET - Abnormal; Notable for the following components:      Result Value   RBC 5.39 (*)    All other components within normal limits  COMPREHENSIVE METABOLIC PANEL - Abnormal; Notable for the following components:   Glucose, Bld 108 (*)    Creatinine, Ser 1.17 (*)    GFR, Estimated 49 (*)    All other components within normal limits  MAGNESIUM  CK  URINALYSIS, COMPLETE (UACMP) WITH MICROSCOPIC   ____________________________________________   ED ECG REPORT I, Vanessa [place], the attending physician, personally viewed and interpreted this ECG.  Atrial fibrillation rate of 70, T wave version in 1 aVL, V6, normal intervals.  Similar T wave inversions to prior ____________________________________________  RADIOLOGY   Official radiology report(s): US Venous Img Lower Bilateral  Result Date:  09/08/2020 CLINICAL DATA:  Bilateral lower extremity pain and edema EXAM: Bilateral LOWER EXTREMITY VENOUS DOPPLER ULTRASOUND TECHNIQUE: Gray-scale sonography with compression, as well as color and duplex ultrasound, were performed to evaluate the deep venous system(s) from the level of the common femoral vein through the popliteal and proximal calf veins. COMPARISON:  03/27/2018 FINDINGS: VENOUS Normal compressibility of the common femoral, superficial femoral, and popliteal veins, as well as the visualized calf veins. Visualized portions of profunda femoral vein and great saphenous vein unremarkable. No filling defects to suggest DVT on grayscale or color Doppler imaging. Doppler waveforms show normal direction of venous flow, normal respiratory plasticity and response to augmentation. Limited views of the contralateral common femoral vein are unremarkable. OTHER None. Limitations: none IMPRESSION: Negative. Electronically Signed   By: Donavan Foil M.D.   On: 09/08/2020 19:17    ____________________________________________   PROCEDURES  Procedure(s) performed (including Critical Care):  Procedures   ____________________________________________   INITIAL IMPRESSION / ASSESSMENT AND PLAN / ED COURSE  Patricia Walker was evaluated in Emergency Department on 09/08/2020 for the symptoms described in the history of present illness. She was evaluated in the context of the global COVID-19 pandemic, which necessitated consideration that the patient might be at risk for infection with the SARS-CoV-2 virus that causes COVID-19. Institutional protocols and algorithms that pertain to the evaluation of patients at risk for COVID-19 are in a state of rapid change based on information released by regulatory bodies including the CDC and federal and state organizations. These policies and algorithms were followed during the patient's care in the ED.    Patient comes in for leg pain bilaterally.  Patient told to  come in to evaluate for blood clots.  Patient has good distal pulses and unlikely arterial issue.  Ultrasounds are ordered to evaluate for venous clot.  We will get some labs to evaluate for [facility]  abnormalities, AKI,, EKG to evaluate for how fast her heart is going given patient states that it felt like it was racing.  7:51 PM patient not stating that she still does have some numbness on her buttock region.  This is concerning for possible saddle anesthesia.  Will get MRI to make sure no evidence of cord compression  10:47 PM UA looks concerning for UTI patient does report foul smell so we will start course of Keflex.  We will send for urine culture.  MRI shows similar disc protrusion with mass-effect on L4 nerve root most likely causing patient's pain.  Patient will already has follow-up with her doctor who is managing this.  She understands that she does not need to go to the MRI tomorrow and talk to her doctor about these results.  At this time however there is no signs of cord compression and patient is safe for discharge  Patient is still able to ambulate even with the pain; she already has tramadol at home           ____________________________________________   FINAL CLINICAL IMPRESSION(S) / ED DIAGNOSES   Final diagnoses:  Pain in both lower extremities  Urinary tract infection without hematuria, site unspecified      MEDICATIONS GIVEN DURING THIS VISIT:  Medications  acetaminophen (TYLENOL) tablet 650 mg (650 mg Oral Given 09/08/20 1849)     ED Discharge Orders         Ordered    cephALEXin (KEFLEX) 500 MG capsule  2 times daily        09/08/20 2248           Note:  This document was prepared using Dragon voice recognition software and [place] include unintentional dictation errors.   Vanessa [place], MD 09/08/20 2249

## 2020-09-08 NOTE — ED Notes (Signed)
Patient transported to Ultrasound 

## 2020-09-08 NOTE — ED Triage Notes (Signed)
First Nurse note:  C/O bilateral leg pain since Friday, also c/o legs feeling cold.  Has been using a heating pad.

## 2020-09-09 ENCOUNTER — Ambulatory Visit: Payer: Medicare HMO

## 2020-09-10 LAB — URINE CULTURE

## 2020-09-11 DIAGNOSIS — N390 Urinary tract infection, site not specified: Secondary | ICD-10-CM | POA: Diagnosis not present

## 2020-09-11 DIAGNOSIS — E782 Mixed hyperlipidemia: Secondary | ICD-10-CM | POA: Diagnosis not present

## 2020-09-11 DIAGNOSIS — M940 Chondrocostal junction syndrome [Tietze]: Secondary | ICD-10-CM | POA: Diagnosis not present

## 2020-09-11 DIAGNOSIS — I10 Essential (primary) hypertension: Secondary | ICD-10-CM | POA: Diagnosis not present

## 2020-09-11 DIAGNOSIS — R300 Dysuria: Secondary | ICD-10-CM | POA: Diagnosis not present

## 2020-09-14 DIAGNOSIS — M47816 Spondylosis without myelopathy or radiculopathy, lumbar region: Secondary | ICD-10-CM | POA: Diagnosis not present

## 2020-09-18 DIAGNOSIS — Z20822 Contact with and (suspected) exposure to covid-19: Secondary | ICD-10-CM | POA: Diagnosis not present

## 2020-09-25 ENCOUNTER — Emergency Department: Payer: Medicare HMO

## 2020-09-25 ENCOUNTER — Emergency Department
Admission: EM | Admit: 2020-09-25 | Discharge: 2020-09-25 | Disposition: A | Discharge status: Home / Self Care | Payer: Medicare HMO | Attending: Emergency Medicine | Admitting: Emergency Medicine

## 2020-09-25 ENCOUNTER — Encounter: Payer: Self-pay | Admitting: Emergency Medicine

## 2020-09-25 ENCOUNTER — Other Ambulatory Visit: Payer: Self-pay

## 2020-09-25 DIAGNOSIS — J45909 Unspecified asthma, uncomplicated: Secondary | ICD-10-CM | POA: Insufficient documentation

## 2020-09-25 DIAGNOSIS — U071 COVID-19: Secondary | ICD-10-CM | POA: Diagnosis not present

## 2020-09-25 DIAGNOSIS — Z87891 Personal history of nicotine dependence: Secondary | ICD-10-CM | POA: Insufficient documentation

## 2020-09-25 DIAGNOSIS — I10 Essential (primary) hypertension: Secondary | ICD-10-CM | POA: Diagnosis not present

## 2020-09-25 DIAGNOSIS — Z7951 Long term (current) use of inhaled steroids: Secondary | ICD-10-CM | POA: Insufficient documentation

## 2020-09-25 DIAGNOSIS — M25511 Pain in right shoulder: Secondary | ICD-10-CM | POA: Diagnosis not present

## 2020-09-25 DIAGNOSIS — E039 Hypothyroidism, unspecified: Secondary | ICD-10-CM | POA: Insufficient documentation

## 2020-09-25 DIAGNOSIS — Z20822 Contact with and (suspected) exposure to covid-19: Secondary | ICD-10-CM | POA: Diagnosis not present

## 2020-09-25 DIAGNOSIS — I2510 Atherosclerotic heart disease of native coronary artery without angina pectoris: Secondary | ICD-10-CM | POA: Diagnosis not present

## 2020-09-25 DIAGNOSIS — Z7901 Long term (current) use of anticoagulants: Secondary | ICD-10-CM | POA: Diagnosis not present

## 2020-09-25 DIAGNOSIS — M545 Low back pain, unspecified: Secondary | ICD-10-CM | POA: Diagnosis not present

## 2020-09-25 DIAGNOSIS — I517 Cardiomegaly: Secondary | ICD-10-CM | POA: Diagnosis not present

## 2020-09-25 DIAGNOSIS — R059 Cough, unspecified: Secondary | ICD-10-CM | POA: Diagnosis not present

## 2020-09-25 MED ORDER — ONDANSETRON 8 MG PO TBDP
8.0000 mg | ORAL_TABLET | Freq: Once | ORAL | Status: AC
  Administered 2020-09-25: 8 mg via ORAL
  Filled 2020-09-25: qty 1

## 2020-09-25 MED ORDER — PREDNISONE 20 MG PO TABS
60.0000 mg | ORAL_TABLET | Freq: Once | ORAL | Status: AC
  Administered 2020-09-25: 60 mg via ORAL
  Filled 2020-09-25: qty 3

## 2020-09-25 MED ORDER — ACETAMINOPHEN 325 MG PO TABS
650.0000 mg | ORAL_TABLET | Freq: Once | ORAL | Status: AC
  Administered 2020-09-25: 650 mg via ORAL
  Filled 2020-09-25: qty 2

## 2020-09-25 MED ORDER — BUTALBITAL-APAP-CAFFEINE 50-325-40 MG PO TABS: 1.0000 | ORAL_TABLET | Freq: Four times a day (QID) | ORAL | 0 refills | Status: AC | PRN

## 2020-09-25 MED ORDER — PREDNISONE 50 MG PO TABS: 50.0000 mg | ORAL_TABLET | Freq: Every day | ORAL | 0 refills | Status: DC

## 2020-09-25 MED ORDER — ALBUTEROL SULFATE HFA 108 (90 BASE) MCG/ACT IN AERS: 2.0000 | INHALATION_SPRAY | Freq: Four times a day (QID) | RESPIRATORY_TRACT | 2 refills | Status: DC | PRN

## 2020-09-25 MED ORDER — ONDANSETRON 4 MG PO TBDP: 4.0000 mg | ORAL_TABLET | Freq: Three times a day (TID) | ORAL | 0 refills | Status: DC | PRN

## 2020-09-25 MED ORDER — PAXLOVID 10 X 150 MG & 10 X 100MG PO TBPK: 2.0000 | ORAL_TABLET | Freq: Two times a day (BID) | ORAL | 0 refills | Status: AC

## 2020-09-25 MED ORDER — BENZONATATE 100 MG PO CAPS: 100.0000 mg | ORAL_CAPSULE | Freq: Four times a day (QID) | ORAL | 0 refills | Status: DC | PRN

## 2020-09-25 NOTE — ED Provider Notes (Signed)
[facility] Emergency Department Provider Note  ____________________________________________  Time seen: Approximately 12:38 PM  I have reviewed the triage vital signs and the nursing notes.   HISTORY  Chief Complaint Back Pain and Cough (COVID +)    HPI Patricia Walker is a 75 y.o. female who presents the emergency department with multiple complaints.  Patient is here for COVID today.  She has had a week of cough, states that the cough was worsening and had a positive COVID test and her primary care office.  She was sent here to ensure that she has no evidence of pneumonia.  Patient is also complaining of shoulder pain and low back pain.  She has a history of chronic lower back issues, has been receiving epidural injections but states that the last injection has not been effective.  They are discussing doing a nerve ablation but states that she has been sick so much over the spring but they have not been able to schedule this.  Patient believes that she has injured her right shoulder trying to compensate by getting up with her back pain.  Patient denies any fevers or chills currently, nasal congestion, sore throat, frank shortness of breath, chest pain, abdominal pain, nausea or vomiting.  No bowel or bladder dysfunction, saddle anesthesia or paresthesias.  No urinary or GI symptoms.       Past Medical History:  Diagnosis Date   Anxiety    Arthritis    Asthma    Coronary artery disease    mild, nonobstructive   Depression    Dyspnea    on exertion   Dysrhythmia    Atrial Fibrillation   GERD (gastroesophageal reflux disease)    Heart murmur    Hyperlipidemia    Hypertension    Hypothyroidism    MI, old 2016   nonobstructive CAD by Cath   Morbid obesity ([place])    Persistent atrial fibrillation ([place])    Pneumonia 06/2018   and RSV   Psoriasis    Sleep apnea    compliant with CPAP   Thyroid disease     Patient Active Problem List   Diagnosis Date  Noted   Pneumonia 06/15/2018   Multifocal pneumonia 06/14/2018   Unstable angina ([place]) 06/13/2017   New onset of headaches 06/12/2017   Photophobia of right eye 06/12/2017   Osteoarthritis of knee 11/17/2016   Coronary artery disease 09/16/2016   History of cardioversion 09/16/2016   Hyperlipidemia, unspecified 09/16/2016   Hypertension 09/16/2016   Obesity, unspecified 09/16/2016   Atrial fibrillation ([place]) 09/08/2016   Cardiac arrhythmia 09/09/2015   Gastroesophageal reflux disease 09/09/2015   Obstructive sleep apnea syndrome 09/09/2015   Psoriasis 09/09/2015    Past Surgical History:  Procedure Laterality Date   ARTERY BIOPSY Right 07/21/2017   Procedure: BIOPSY TEMPORAL ARTERY;  Surgeon: [name], MD;  Location: [facility];  Service: Vascular;  Laterality: Right;   CARDIAC CATHETERIZATION     CARDIOVERSION Right 09/01/2016   Procedure: Cardioversion;  Surgeon: [name], MD;  Location: [facility];  Service: Cardiovascular;  Laterality: Right;   CARDIOVERSION N/A 09/09/2016   Procedure: Cardioversion;  Surgeon: [name], MD;  Location: [facility];  Service: Cardiovascular;  Laterality: N/A;   CATARACT EXTRACTION W/PHACO Right 03/26/2019   Procedure: CATARACT EXTRACTION PHACO AND INTRAOCULAR LENS PLACEMENT ([place]) RIGHT 6.45, 00:39.9;  Surgeon: Birder Robson, MD;  Location: [facility];  Service: Ophthalmology;  Laterality: Right;   CATARACT EXTRACTION W/PHACO Left 04/16/2019  Procedure: CATARACT EXTRACTION PHACO AND INTRAOCULAR LENS PLACEMENT ([facility]) LEFT;   3.14, 00:24.9;  Surgeon: Birder Robson, MD;  Location: [facility];  Service: Ophthalmology;  Laterality: Left;  sleep apnea-CPAP   COLONOSCOPY WITH PROPOFOL N/A 08/28/2019   Procedure: COLONOSCOPY WITH PROPOFOL;  Surgeon: Toledo, Benay Pike, MD;  Location: [facility];  Service: Gastroenterology;  Laterality: N/A;   ELECTROPHYSIOLOGIC STUDY N/A 02/01/2016   Procedure: CARDIOVERSION;   Surgeon: [name], MD;  Location: [facility];  Service: Cardiovascular;  Laterality: N/A;   ESOPHAGEAL DILATION     ESOPHAGOGASTRODUODENOSCOPY (EGD) WITH PROPOFOL N/A 02/06/2017   Procedure: ESOPHAGOGASTRODUODENOSCOPY (EGD) WITH PROPOFOL;  Surgeon: [name], MD;  Location: [facility];  Service: Gastroenterology;  Laterality: N/A;   ESOPHAGOGASTRODUODENOSCOPY (EGD) WITH PROPOFOL N/A 08/28/2019   Procedure: ESOPHAGOGASTRODUODENOSCOPY (EGD) WITH PROPOFOL;  Surgeon: Toledo, Benay Pike, MD;  Location: [facility];  Service: Gastroenterology;  Laterality: N/A;   EUS N/A 02/16/2017   Procedure: FULL UPPER ENDOSCOPIC ULTRASOUND (EUS) RADIAL;  Surgeon: [name], MD;  Location: [facility];  Service: Gastroenterology;  Laterality: N/A;   LEFT HEART CATH AND CORONARY ANGIOGRAPHY N/A 09/08/2016   Procedure: Left Heart Cath and Coronary Angiography;  Surgeon: [name], MD;  Location: [facility];  Service: Cardiovascular;  Laterality: N/A;   US ECHOCARDIOGRAPHY      Prior to Admission medications   Medication Sig Start Date End Date Taking? Authorizing Provider  albuterol (VENTOLIN HFA) 108 (90 Base) MCG/ACT inhaler Inhale 2 puffs into the lungs every 6 (six) hours as needed for wheezing or shortness of breath. 09/25/20  Yes Osama Coleson, Charline Bills, PA-C  benzonatate (TESSALON PERLES) 100 MG capsule Take 1 capsule (100 mg total) by mouth every 6 (six) hours as needed for cough. 09/25/20 09/25/21 Yes Flornce Record, Charline Bills, PA-C  butalbital-acetaminophen-caffeine (FIORICET) [phone] MG tablet Take 1 tablet by mouth every 6 (six) hours as needed for headache. 09/25/20 09/25/21 Yes Jadelyn Elks, Charline Bills, PA-C  nirmatrelvir/ritonavir EUA, renal dosing, (PAXLOVID) TBPK Take 2 tablets by mouth 2 (two) times daily for 5 days. Patient GFR is 49. Take nirmatrelvir (150 mg) one tablet twice daily for 5 days and ritonavir (100 mg) one tablet twice daily for 5 days. 09/25/20 09/30/20 Yes  Juvenal Umar, Charline Bills, PA-C  ondansetron (ZOFRAN-ODT) 4 MG disintegrating tablet Take 1 tablet (4 mg total) by mouth every 8 (eight) hours as needed for nausea or vomiting. 09/25/20  Yes Shirlene Andaya, Charline Bills, PA-C  predniSONE (DELTASONE) 50 MG tablet Take 1 tablet (50 mg total) by mouth daily with breakfast. 09/25/20  Yes Marieclaire Bettenhausen, Charline Bills, PA-C  apixaban (ELIQUIS) 5 MG TABS tablet Take 5 mg by mouth 2 (two) times daily.    [provider]  atorvastatin (LIPITOR) 20 MG tablet Take 20 mg by mouth every evening. 08/25/16   [provider]  azelastine (ASTELIN) 0.1 % nasal spray Place 2 sprays into both nostrils 2 (two) times daily as needed for rhinitis or allergies.  10/13/16   [provider]  baclofen (LIORESAL) 10 MG tablet Take 10 mg by mouth 2 (two) times daily. 06/13/17   [provider]  budesonide-formoterol (SYMBICORT) 160-4.5 MCG/ACT inhaler Inhale 2 puffs into the lungs 2 (two) times daily.     [provider]  doxepin (SINEQUAN) 25 MG capsule Take 25 mg by mouth at bedtime.    [provider]  esomeprazole (NEXIUM) 40 MG capsule Take 40 mg by mouth daily. 07/01/17   [provider]  folic  acid (FOLVITE) 1 MG tablet Take 1 mg by mouth daily.    [provider]  furosemide (LASIX) 20 MG tablet Take 20 mg by mouth daily as needed for fluid. 08/24/16   [provider]  gabapentin (NEURONTIN) 300 MG capsule Take 300 mg by mouth 2 (two) times daily.    [provider]  levofloxacin (LEVAQUIN) 500 MG tablet Take 1 tablet (500 mg total) by mouth daily. 06/18/18   Hillary Bow, MD  levothyroxine (SYNTHROID, LEVOTHROID) 50 MCG tablet Take 50 mcg by mouth daily before breakfast.    [provider]  methotrexate (RHEUMATREX) 2.5 MG tablet Take 10 mg by mouth once a week. Caution:Chemotherapy. Protect from light.    [provider]  montelukast (SINGULAIR) 10 MG tablet Take 10 mg by mouth at bedtime.   05/26/17   [provider]  PARoxetine (PAXIL) 20 MG tablet Take 20 mg by mouth daily.    [provider]  pregabalin (LYRICA) 50 MG capsule Take 50 mg by mouth daily as needed for pain.    [provider]  ranitidine (ZANTAC) 150 MG tablet Take 150 mg by mouth 2 (two) times daily. 04/27/17   [provider]  SKYRIZI, 150 MG DOSE, 75 MG/0.83ML PSKT Inject 1 Dose into the skin every 3 (three) months. 05/09/18   [provider]  traMADol (ULTRAM) 50 MG tablet Take 2 tablets (100 mg total) by mouth every 6 (six) hours as needed for moderate pain or severe pain. 08/23/19   Hinda Kehr, MD  triamcinolone ointment (KENALOG) 0.1 % Apply 1 application topically as directed. 05/09/18   [provider]    Allergies Codeine and Other  Family History  Problem Relation Age of Onset   Breast cancer Paternal Aunt    Other Father    Heart attack Father     Social History Social History   Tobacco Use   Smoking status: Former    Pack years: 0.00    Types: Cigarettes    Quit date: 02/01/2013    Years since quitting: 7.6   Smokeless tobacco: Never  Vaping Use   Vaping Use: Never used  Substance Use Topics   Alcohol use: No   Drug use: No     Review of Systems  Constitutional: No fever/chills Eyes: No visual changes. No discharge ENT: No upper respiratory complaints. Cardiovascular: no chest pain. Respiratory: Positive cough. No SOB. Gastrointestinal: No abdominal pain.  No nausea, no vomiting.  No diarrhea.  No constipation. Genitourinary: Negative for dysuria. No hematuria Musculoskeletal: Positive for right shoulder and lower back pain Skin: Negative for rash, abrasions, lacerations, ecchymosis. Neurological: Negative for headaches, focal weakness or numbness.  10 System ROS otherwise negative.  ____________________________________________   PHYSICAL EXAM:  VITAL SIGNS: ED Triage Vitals  Enc Vitals Group     BP 09/25/20 1139  105/68     Pulse Rate 09/25/20 1139 85     Resp 09/25/20 1139 16     Temp 09/25/20 1139 99 F (37.2 C)     Temp Source 09/25/20 1139 Oral     SpO2 09/25/20 1139 95 %     Weight 09/25/20 1136 209 lb (94.8 kg)     Height 09/25/20 1136 5\' 4"  (1.626 m)     Head Circumference --      Peak Flow --      Pain Score 09/25/20 1136 8     Pain Loc --      Pain Edu? --  Excl. in [place]? --      Constitutional: Alert and oriented. Well appearing and in no acute distress. Eyes: Conjunctivae are normal. PERRL. EOMI. Head: Atraumatic. ENT:      Ears:       Nose: No congestion/rhinnorhea.      Mouth/Throat: Mucous membranes are moist.  Neck: No stridor.  Hematological/Lymphatic/Immunilogical: No cervical lymphadenopathy. Cardiovascular: Normal rate, regular rhythm. Normal S1 and S2.  Good peripheral circulation. Respiratory: Normal respiratory effort without tachypnea or retractions. Lungs with faint expiratory wheezing in the bilateral lower lung fields.  No rales or rhonchi.Kermit Balo air entry to the bases with no decreased or absent breath sounds. Gastrointestinal: Bowel sounds 4 quadrants. Soft and nontender to palpation. No guarding or rigidity. No palpable masses. No distention. No CVA tenderness Musculoskeletal: Full range of motion to all extremities. No gross deformities appreciated.  Visualization of the right shoulder reveals no obvious signs of trauma.  Limited range of motion due to pain.  Diffuse tenderness throughout the shoulder joint.  No specific point tenderness.  No palpable abnormality.  Radial pulses sensation intact distally.  Examination of the lower back reveals no visible signs of trauma.  Diffuse tenderness in the lumbar spine extending into the SI joints and sciatic notches bilaterally.  Dorsalis pedis pulses sensation intact and equal bilateral lower extremities. Neurologic:  Normal speech and language. No gross focal neurologic deficits are appreciated.  Skin:  Skin is warm,  dry and intact. No rash noted. Psychiatric: Mood and affect are normal. Speech and behavior are normal. Patient exhibits appropriate insight and judgement.   ____________________________________________   LABS (all labs ordered are listed, but only abnormal results are displayed)  Labs Reviewed - No data to display ____________________________________________  EKG   ____________________________________________  RADIOLOGY I personally viewed and evaluated these images as part of my medical decision making, as well as reviewing the written report by the radiologist.  ED Provider Interpretation: No acute consolidation on chest x-ray concerning for pneumonia.  No evidence of acute trauma to shoulder or lumbar spine  DG Chest 1 View  Result Date: 09/25/2020 CLINICAL DATA:  Cough for 1 week. EXAM: CHEST  1 VIEW COMPARISON:  PA and lateral chest 06/20/2020. FINDINGS: The lungs are clear. Cardiomegaly. No pneumothorax or pleural fluid. No acute or focal bony abnormality. IMPRESSION: Cardiomegaly without acute disease. Electronically Signed   By: Inge Rise M.D.   On: 09/25/2020 13:29   DG Lumbar Spine 2-3 Views  Result Date: 09/25/2020 CLINICAL DATA:  Pain EXAM: LUMBAR SPINE - 2-3 VIEW COMPARISON:  Lumbar MRI [place] 31, 2022; lumbar CT [place] 14, 2021 FINDINGS: Frontal, lateral, and spot lumbosacral lateral images were obtained. There are 5 nonrib bearing lumbar type vertebral bodies. There is a transitional lumbosacral vertebra, denoted as S1. No fracture or spondylolisthesis. Severe disc space narrowing at L4-5 noted. Mild disc space narrowing at T12-L1 and L1-2. Anterior osteophytes noted L1, L2, L4, L5. No erosive changes. Foci of aortic atherosclerosis noted. 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 No fracture or spondylolisthesis. Aortic Atherosclerosis (ICD10-I70.0). Electronically Signed   By: Lowella Grip III M.D.   On: 09/25/2020 13:32    DG Shoulder Right  Result Date: 09/25/2020 CLINICAL DATA:  Pain EXAM: RIGHT SHOULDER - 2+ VIEW COMPARISON:  None. FINDINGS: Frontal, oblique, and Y scapular images obtained. No fracture or dislocation. Joint spaces appear normal. No erosive change. Visualized right lung clear. IMPRESSION: No fracture or dislocation.  No appreciable arthropathy. Electronically  Signed   By: Lowella Grip III M.D.   On: 09/25/2020 13:29    ____________________________________________    PROCEDURES  Procedure(s) performed:    Procedures    Medications  acetaminophen (TYLENOL) tablet 650 mg (650 mg Oral Given 09/25/20 1426)  ondansetron (ZOFRAN-ODT) disintegrating tablet 8 mg (8 mg Oral Given 09/25/20 1426)  predniSONE (DELTASONE) tablet 60 mg (60 mg Oral Given 09/25/20 1426)     ____________________________________________   INITIAL IMPRESSION / ASSESSMENT AND PLAN / ED COURSE  Pertinent labs & imaging results that were available during my care of the patient were reviewed by me and considered in my medical decision making (see chart for details).  Review of the [place] CSRS was performed in accordance of the [place] prior to dispensing any controlled drugs.           Patient's diagnosis is consistent with COVID-19, shoulder and low back pain.  Patient presented to emergency department with 2 chronic and one acute issue.  Patient has had ongoing issues with her shoulder and her back.  There were no concerning physical exam findings or findings on imaging to warrant further evaluation.  Patient will have these managed outpatient once she has symptomatic improvement of her COVID symptoms.  Patient did arrive COVID-positive.  There was no evidence of superimposed bacterial pneumonia.  At this time patient will be treated with steroid, albuterol, cough medication, Zofran and Paxil ovate.  Follow-up primary care as needed.  Return precautions discussed with the patient.  Once her COVID symptoms have  improved follow-up with her orthopedic and neurosurgeon for evaluation of her ongoing chronic issues with her shoulder and back..  Follow-up with primary care in regards to [place].  Patient is given ED precautions to return to the ED for any worsening or new symptoms.     ____________________________________________  FINAL CLINICAL IMPRESSION(S) / ED DIAGNOSES  Final diagnoses:  COVID-19      NEW MEDICATIONS STARTED DURING THIS VISIT:  ED Discharge Orders          Ordered    nirmatrelvir/ritonavir EUA, renal dosing, (PAXLOVID) TBPK  2 times daily        09/25/20 1424    predniSONE (DELTASONE) 50 MG tablet  Daily with breakfast        09/25/20 1424    albuterol (VENTOLIN HFA) 108 (90 Base) MCG/ACT inhaler  Every 6 hours PRN        09/25/20 1424    butalbital-acetaminophen-caffeine (FIORICET) 50-325-40 MG tablet  Every 6 hours PRN        09/25/20 1424    ondansetron (ZOFRAN-ODT) 4 MG disintegrating tablet  Every 8 hours PRN        09/25/20 1424    benzonatate (TESSALON PERLES) 100 MG capsule  Every 6 hours PRN        09/25/20 1424                This chart was dictated using voice recognition software/Dragon. Despite best efforts to proofread, errors can occur which can change the meaning. Any change was purely unintentional.    Darletta Moll, PA-C 09/25/20 1444    Lavonia Drafts, MD 09/25/20 1454

## 2020-09-25 NOTE — ED Notes (Signed)
See triage note. Pt here for back and right shoulder pain. Pt was informed that she tested positive for covid today and has been having cough, headaches, and chills.

## 2020-09-25 NOTE — ED Triage Notes (Signed)
Pt to ED via POV, pt states that she had a procedure on her back in [place] and has been having pain in her back since then. Pt also c/o cough x 1 week. Pt states that she had COVID test today and they just called her and told her that her COVID test was positive. Pt is in NAD.

## 2020-09-26 DIAGNOSIS — J45909 Unspecified asthma, uncomplicated: Secondary | ICD-10-CM | POA: Diagnosis not present

## 2020-10-05 ENCOUNTER — Other Ambulatory Visit: Payer: Self-pay

## 2020-10-05 ENCOUNTER — Emergency Department: Payer: Medicare HMO

## 2020-10-05 ENCOUNTER — Encounter: Payer: Self-pay | Admitting: Emergency Medicine

## 2020-10-05 ENCOUNTER — Emergency Department
Admission: EM | Admit: 2020-10-05 | Discharge: 2020-10-05 | Disposition: A | Discharge status: Home / Self Care | Payer: Medicare HMO | Source: Home / Self Care | Attending: Emergency Medicine | Admitting: Emergency Medicine

## 2020-10-05 DIAGNOSIS — U071 COVID-19: Secondary | ICD-10-CM | POA: Insufficient documentation

## 2020-10-05 DIAGNOSIS — R0602 Shortness of breath: Secondary | ICD-10-CM | POA: Diagnosis not present

## 2020-10-05 DIAGNOSIS — F419 Anxiety disorder, unspecified: Secondary | ICD-10-CM | POA: Diagnosis present

## 2020-10-05 DIAGNOSIS — Z79899 Other long term (current) drug therapy: Secondary | ICD-10-CM | POA: Insufficient documentation

## 2020-10-05 DIAGNOSIS — Z885 Allergy status to narcotic agent status: Secondary | ICD-10-CM | POA: Diagnosis not present

## 2020-10-05 DIAGNOSIS — N281 Cyst of kidney, acquired: Secondary | ICD-10-CM | POA: Diagnosis not present

## 2020-10-05 DIAGNOSIS — G8929 Other chronic pain: Secondary | ICD-10-CM | POA: Diagnosis present

## 2020-10-05 DIAGNOSIS — Z87891 Personal history of nicotine dependence: Secondary | ICD-10-CM | POA: Diagnosis not present

## 2020-10-05 DIAGNOSIS — R109 Unspecified abdominal pain: Secondary | ICD-10-CM | POA: Insufficient documentation

## 2020-10-05 DIAGNOSIS — Z7901 Long term (current) use of anticoagulants: Secondary | ICD-10-CM | POA: Diagnosis not present

## 2020-10-05 DIAGNOSIS — R531 Weakness: Secondary | ICD-10-CM | POA: Diagnosis not present

## 2020-10-05 DIAGNOSIS — Z79890 Hormone replacement therapy (postmenopausal): Secondary | ICD-10-CM | POA: Diagnosis not present

## 2020-10-05 DIAGNOSIS — R059 Cough, unspecified: Secondary | ICD-10-CM

## 2020-10-05 DIAGNOSIS — Z8249 Family history of ischemic heart disease and other diseases of the circulatory system: Secondary | ICD-10-CM | POA: Diagnosis not present

## 2020-10-05 DIAGNOSIS — J45909 Unspecified asthma, uncomplicated: Secondary | ICD-10-CM | POA: Insufficient documentation

## 2020-10-05 DIAGNOSIS — I4891 Unspecified atrial fibrillation: Secondary | ICD-10-CM | POA: Diagnosis not present

## 2020-10-05 DIAGNOSIS — R519 Headache, unspecified: Secondary | ICD-10-CM | POA: Diagnosis present

## 2020-10-05 DIAGNOSIS — I10 Essential (primary) hypertension: Secondary | ICD-10-CM | POA: Insufficient documentation

## 2020-10-05 DIAGNOSIS — I700 Atherosclerosis of aorta: Secondary | ICD-10-CM | POA: Diagnosis not present

## 2020-10-05 DIAGNOSIS — E039 Hypothyroidism, unspecified: Secondary | ICD-10-CM | POA: Insufficient documentation

## 2020-10-05 DIAGNOSIS — K219 Gastro-esophageal reflux disease without esophagitis: Secondary | ICD-10-CM | POA: Diagnosis present

## 2020-10-05 DIAGNOSIS — Z8616 Personal history of COVID-19: Secondary | ICD-10-CM | POA: Diagnosis not present

## 2020-10-05 DIAGNOSIS — I4819 Other persistent atrial fibrillation: Secondary | ICD-10-CM | POA: Insufficient documentation

## 2020-10-05 DIAGNOSIS — F32A Depression, unspecified: Secondary | ICD-10-CM | POA: Diagnosis not present

## 2020-10-05 DIAGNOSIS — G4733 Obstructive sleep apnea (adult) (pediatric): Secondary | ICD-10-CM | POA: Diagnosis not present

## 2020-10-05 DIAGNOSIS — Z7951 Long term (current) use of inhaled steroids: Secondary | ICD-10-CM | POA: Insufficient documentation

## 2020-10-05 DIAGNOSIS — I2510 Atherosclerotic heart disease of native coronary artery without angina pectoris: Secondary | ICD-10-CM | POA: Insufficient documentation

## 2020-10-05 DIAGNOSIS — J441 Chronic obstructive pulmonary disease with (acute) exacerbation: Secondary | ICD-10-CM | POA: Diagnosis not present

## 2020-10-05 DIAGNOSIS — J440 Chronic obstructive pulmonary disease with acute lower respiratory infection: Secondary | ICD-10-CM | POA: Diagnosis not present

## 2020-10-05 DIAGNOSIS — Z7952 Long term (current) use of systemic steroids: Secondary | ICD-10-CM | POA: Diagnosis not present

## 2020-10-05 DIAGNOSIS — E785 Hyperlipidemia, unspecified: Secondary | ICD-10-CM | POA: Diagnosis not present

## 2020-10-05 DIAGNOSIS — J209 Acute bronchitis, unspecified: Secondary | ICD-10-CM | POA: Diagnosis not present

## 2020-10-05 DIAGNOSIS — I252 Old myocardial infarction: Secondary | ICD-10-CM | POA: Diagnosis not present

## 2020-10-05 DIAGNOSIS — R911 Solitary pulmonary nodule: Secondary | ICD-10-CM | POA: Diagnosis not present

## 2020-10-05 DIAGNOSIS — R030 Elevated blood-pressure reading, without diagnosis of hypertension: Secondary | ICD-10-CM | POA: Diagnosis present

## 2020-10-05 DIAGNOSIS — L409 Psoriasis, unspecified: Secondary | ICD-10-CM | POA: Diagnosis present

## 2020-10-05 DIAGNOSIS — R079 Chest pain, unspecified: Secondary | ICD-10-CM | POA: Diagnosis not present

## 2020-10-05 LAB — LACTIC ACID, PLASMA
Lactic Acid, Venous: 2.4 mmol/L (ref 0.5–1.9)
Lactic Acid, Venous: 1.7 mmol/L (ref 0.5–1.9)

## 2020-10-05 LAB — COMPREHENSIVE METABOLIC PANEL
ALT: 31 U/L (ref 0–44)
AST: 29 U/L (ref 15–41)
Albumin: 3.4 g/dL — ABNORMAL LOW (ref 3.5–5.0)
Alkaline Phosphatase: 78 U/L (ref 38–126)
Anion gap: 8 (ref 5–15)
BUN: 19 mg/dL (ref 8–23)
CO2: 28 mmol/L (ref 22–32)
Calcium: 8.6 mg/dL — ABNORMAL LOW (ref 8.9–10.3)
Chloride: 102 mmol/L (ref 98–111)
Creatinine, Ser: 0.99 mg/dL (ref 0.44–1.00)
GFR, Estimated: 60 mL/min — ABNORMAL LOW (ref >60)
Glucose, Bld: 160 mg/dL — ABNORMAL HIGH (ref 70–99)
Potassium: 4.1 mmol/L (ref 3.5–5.1)
Sodium: 138 mmol/L (ref 135–145)
Total Bilirubin: 0.8 mg/dL (ref 0.3–1.2)
Total Protein: 6.8 g/dL (ref 6.5–8.1)

## 2020-10-05 LAB — CBC WITH DIFFERENTIAL/PLATELET
Abs Immature Granulocytes: 0.07 10*3/uL (ref 0.00–0.07)
Basophils Absolute: 0 10*3/uL (ref 0.0–0.1)
Basophils Relative: 0 %
Eosinophils Absolute: 0.1 10*3/uL (ref 0.0–0.5)
Eosinophils Relative: 2 %
HCT: 46.9 % — ABNORMAL HIGH (ref 36.0–46.0)
Hemoglobin: 15.2 g/dL — ABNORMAL HIGH (ref 12.0–15.0)
Immature Granulocytes: 1 %
Lymphocytes Relative: 17 %
Lymphs Abs: 1.2 10*3/uL (ref 0.7–4.0)
MCH: 27.2 pg (ref 26.0–34.0)
MCHC: 32.4 g/dL (ref 30.0–36.0)
MCV: 83.9 fL (ref 80.0–100.0)
Monocytes Absolute: 0.5 10*3/uL (ref 0.1–1.0)
Monocytes Relative: 7 %
Neutro Abs: 5 10*3/uL (ref 1.7–7.7)
Neutrophils Relative %: 73 %
Platelets: 335 10*3/uL (ref 150–400)
RBC: 5.59 MIL/uL — ABNORMAL HIGH (ref 3.87–5.11)
RDW: 13.3 % (ref 11.5–15.5)
WBC: 6.8 10*3/uL (ref 4.0–10.5)
nRBC: 0 % (ref 0.0–0.2)

## 2020-10-05 LAB — URINALYSIS, COMPLETE (UACMP) WITH MICROSCOPIC
Bilirubin Urine: NEGATIVE
Glucose, UA: NEGATIVE mg/dL
Hgb urine dipstick: NEGATIVE
Ketones, ur: NEGATIVE mg/dL
Leukocytes,Ua: NEGATIVE
Nitrite: NEGATIVE
Protein, ur: NEGATIVE mg/dL
Specific Gravity, Urine: >1.046 — ABNORMAL HIGH (ref 1.005–1.030)
pH: 5 (ref 5.0–8.0)

## 2020-10-05 LAB — TROPONIN I (HIGH SENSITIVITY)
Troponin I (High Sensitivity): 20 ng/L — ABNORMAL HIGH (ref <18)
Troponin I (High Sensitivity): 20 ng/L — ABNORMAL HIGH (ref <18)

## 2020-10-05 LAB — BRAIN NATRIURETIC PEPTIDE: B Natriuretic Peptide: 316.9 pg/mL — ABNORMAL HIGH (ref 0.0–100.0)

## 2020-10-05 MED ORDER — GUAIFENESIN 100 MG/5ML PO SOLN
200.0000 mg | ORAL | Status: DC | PRN
  Administered 2020-10-05: 200 mg via ORAL
  Filled 2020-10-05: qty 20

## 2020-10-05 MED ORDER — SODIUM CHLORIDE 0.9 % IV BOLUS
500.0000 mL | Freq: Once | INTRAVENOUS | Status: AC
  Administered 2020-10-05: 500 mL via INTRAVENOUS

## 2020-10-05 MED ORDER — ACETAMINOPHEN 500 MG PO TABS
1000.0000 mg | ORAL_TABLET | Freq: Once | ORAL | Status: AC
  Administered 2020-10-05: 1000 mg via ORAL
  Filled 2020-10-05: qty 2

## 2020-10-05 MED ORDER — IPRATROPIUM-ALBUTEROL 0.5-2.5 (3) MG/3ML IN SOLN
3.0000 mL | Freq: Once | RESPIRATORY_TRACT | Status: AC
  Administered 2020-10-05: 3 mL via RESPIRATORY_TRACT
  Filled 2020-10-05: qty 3

## 2020-10-05 MED ORDER — IOHEXOL 350 MG/ML SOLN
100.0000 mL | Freq: Once | INTRAVENOUS | Status: AC | PRN
  Administered 2020-10-05: 100 mL via INTRAVENOUS
  Filled 2020-10-05: qty 100

## 2020-10-05 MED ORDER — PROMETHAZINE-PHENYLEPHRINE 6.25-5 MG/5ML PO SYRP
5.0000 mL | ORAL_SOLUTION | ORAL | 0 refills | Status: DC | PRN
Start: 2020-10-05 — End: 2020-10-14

## 2020-10-05 NOTE — ED Notes (Signed)
Pt taken to ct 

## 2020-10-05 NOTE — ED Provider Notes (Signed)
[facility] Emergency Department Provider Note  ____________________________________________   Event Date/Time   First MD Initiated Contact with Patient 10/05/20 1035     (approximate)  I have reviewed the triage vital signs and the nursing notes.   HISTORY  Chief Complaint Cough and Weakness    HPI Patricia Walker is a 75 y.o. female with depression, chronic back pain, A. fib on Eliquis who comes in with multiple symptoms.  Patient states that she is continue to have a progressive cough and weakness for the past 2 weeks.  She states that she was diagnosed with COVID about 2 weeks ago.  She has been on Palovid, steroid, cough medicine without any improvement in symptoms.  Patient states her symptoms are severe, not getting better.  She states that yesterday she started develop worsening shortness of breath worse with ambulation, better at rest.  Denies having this previously.  Denies any unilateral leg swelling.  Denies a history of blood clots.  She also reports some abdominal pain all over her abdomen has been going on for about 3 days.  She denies any falls or hitting her head.  She states that she thinks she might have a UTI.  She was given treatment with Keflex for UTI on 5/31.  I did review her culture from that date that stated that it looked like multiple species were present.  Unable to get resistances.          Past Medical History:  Diagnosis Date   Anxiety    Arthritis    Asthma    Coronary artery disease    mild, nonobstructive   Depression    Dyspnea    on exertion   Dysrhythmia    Atrial Fibrillation   GERD (gastroesophageal reflux disease)    Heart murmur    Hyperlipidemia    Hypertension    Hypothyroidism    MI, old 2016   nonobstructive CAD by Cath   Morbid obesity ([place])    Persistent atrial fibrillation ([place])    Pneumonia 06/2018   and RSV   Psoriasis    Sleep apnea    compliant with CPAP   Thyroid disease     Patient  Active Problem List   Diagnosis Date Noted   Pneumonia 06/15/2018   Multifocal pneumonia 06/14/2018   Unstable angina ([place]) 06/13/2017   New onset of headaches 06/12/2017   Photophobia of right eye 06/12/2017   Osteoarthritis of knee 11/17/2016   Coronary artery disease 09/16/2016   History of cardioversion 09/16/2016   Hyperlipidemia, unspecified 09/16/2016   Hypertension 09/16/2016   Obesity, unspecified 09/16/2016   Atrial fibrillation ([place]) 09/08/2016   Cardiac arrhythmia 09/09/2015   Gastroesophageal reflux disease 09/09/2015   Obstructive sleep apnea syndrome 09/09/2015   Psoriasis 09/09/2015    Past Surgical History:  Procedure Laterality Date   ARTERY BIOPSY Right 07/21/2017   Procedure: BIOPSY TEMPORAL ARTERY;  Surgeon: [name], MD;  Location: [facility];  Service: Vascular;  Laterality: Right;   CARDIAC CATHETERIZATION     CARDIOVERSION Right 09/01/2016   Procedure: Cardioversion;  Surgeon: [name], MD;  Location: [facility];  Service: Cardiovascular;  Laterality: Right;   CARDIOVERSION N/A 09/09/2016   Procedure: Cardioversion;  Surgeon: [name], MD;  Location: [facility];  Service: Cardiovascular;  Laterality: N/A;   CATARACT EXTRACTION W/PHACO Right 03/26/2019   Procedure: CATARACT EXTRACTION PHACO AND INTRAOCULAR LENS PLACEMENT ([place]) RIGHT 6.45, 00:39.9;  Surgeon: Birder Robson, MD;  Location:  [place];  Service: Ophthalmology;  Laterality: Right;   CATARACT EXTRACTION W/PHACO Left 04/16/2019   Procedure: CATARACT EXTRACTION PHACO AND INTRAOCULAR LENS PLACEMENT ([facility]) LEFT;   3.14, 00:24.9;  Surgeon: Birder Robson, MD;  Location: [facility];  Service: Ophthalmology;  Laterality: Left;  sleep apnea-CPAP   COLONOSCOPY WITH PROPOFOL N/A 08/28/2019   Procedure: COLONOSCOPY WITH PROPOFOL;  Surgeon: Toledo, Benay Pike, MD;  Location: [facility];  Service: Gastroenterology;  Laterality: N/A;   ELECTROPHYSIOLOGIC STUDY N/A  02/01/2016   Procedure: CARDIOVERSION;  Surgeon: [name], MD;  Location: [facility];  Service: Cardiovascular;  Laterality: N/A;   ESOPHAGEAL DILATION     ESOPHAGOGASTRODUODENOSCOPY (EGD) WITH PROPOFOL N/A 02/06/2017   Procedure: ESOPHAGOGASTRODUODENOSCOPY (EGD) WITH PROPOFOL;  Surgeon: [name], MD;  Location: [facility] END[facility];  Service: Gastroenterology;  Laterality: N/A;   ESOPHAGOGASTRODUODENOSCOPY (EGD) WITH PROPOFOL N/A 08/28/2019   Procedure: ESOPHAGOGASTRODUODENOSCOPY (EGD) WITH PROPOFOL;  Surgeon: Toledo, Benay Pike, MD;  Location: [facility]OSCOPY;  Service: Gastroenterology;  Laterality: N/A;   EUS N/A 02/16/2017   Procedure: FULL UPPER ENDOSCOPIC ULTRASOUND (EUS) RADIAL;  Surgeon: [name], MD;  Location: [facility] ENDOS[facility];  Service: Gastroenterology;  Laterality: N/A;   LEFT HEART CATH AND CORONARY ANGIOGRAPHY N/A 09/08/2016   Procedure: Left Heart Cath and Coronary Angiography;  Surgeon: [name], MD;  Location: [facility];  Service: Cardiovascular;  Laterality: N/A;   US ECHOCARDIOGRAPHY      Prior to Admission medications   Medication Sig Start Date End Date Taking? Authorizing Provider  albuterol (VENTOLIN HFA) 108 (90 Base) MCG/ACT inhaler Inhale 2 puffs into the lungs every 6 (six) hours as needed for wheezing or shortness of breath. 09/25/20   Cuthriell, Charline Bills, PA-C  apixaban (ELIQUIS) 5 MG TABS tablet Take 5 mg by mouth 2 (two) times daily.    [provider]  atorvastatin (LIPITOR) 20 MG tablet Take 20 mg by mouth every evening. 08/25/16   [provider]  azelastine (ASTELIN) 0.1 % nasal spray Place 2 sprays into both nostrils 2 (two) times daily as needed for rhinitis or allergies.  10/13/16   [provider]  baclofen (LIORESAL) 10 MG tablet Take 10 mg by mouth 2 (two) times daily. 06/13/17   [provider]  benzonatate (TESSALON PERLES) 100 MG capsule Take 1 capsule (100 mg total) by mouth every 6 (six) hours as  needed for cough. 09/25/20 09/25/21  Cuthriell, Charline Bills, PA-C  budesonide-formoterol (SYMBICORT) 160-4.5 MCG/ACT inhaler Inhale 2 puffs into the lungs 2 (two) times daily.     [provider]  butalbital-acetaminophen-caffeine (FIORICET) [phone] MG tablet Take 1 tablet by mouth every 6 (six) hours as needed for headache. 09/25/20 09/25/21  Cuthriell, Charline Bills, PA-C  doxepin (SINEQUAN) 25 MG capsule Take 25 mg by mouth at bedtime.    [provider]  esomeprazole (NEXIUM) 40 MG capsule Take 40 mg by mouth daily. 07/01/17   [provider]  folic acid (FOLVITE) 1 MG tablet Take 1 mg by mouth daily.    [provider]  furosemide (LASIX) 20 MG tablet Take 20 mg by mouth daily as needed for fluid. 08/24/16   [provider]  gabapentin (NEURONTIN) 300 MG capsule Take 300 mg by mouth 2 (two) times daily.    [provider]  levofloxacin (LEVAQUIN) 500 MG tablet Take 1 tablet (500 mg total) by mouth daily. 06/18/18   Hillary Bow, MD  levothyroxine (SYNTHROID, LEVOTHROID) 50 MCG tablet Take 50  mcg by mouth daily before breakfast.    [provider]  methotrexate (RHEUMATREX) 2.5 MG tablet Take 10 mg by mouth once a week. Caution:Chemotherapy. Protect from light.    [provider]  montelukast (SINGULAIR) 10 MG tablet Take 10 mg by mouth at bedtime.  05/26/17   [provider]  ondansetron (ZOFRAN-ODT) 4 MG disintegrating tablet Take 1 tablet (4 mg total) by mouth every 8 (eight) hours as needed for nausea or vomiting. 09/25/20   Cuthriell, Charline Bills, PA-C  PARoxetine (PAXIL) 20 MG tablet Take 20 mg by mouth daily.    [provider]  predniSONE (DELTASONE) 50 MG tablet Take 1 tablet (50 mg total) by mouth daily with breakfast. 09/25/20   Cuthriell, Charline Bills, PA-C  pregabalin (LYRICA) 50 MG capsule Take 50 mg by mouth daily as needed for pain.    [provider]  ranitidine (ZANTAC) 150 MG tablet Take 150 mg  by mouth 2 (two) times daily. 04/27/17   [provider]  SKYRIZI, 150 MG DOSE, 75 MG/0.83ML PSKT Inject 1 Dose into the skin every 3 (three) months. 05/09/18   [provider]  traMADol (ULTRAM) 50 MG tablet Take 2 tablets (100 mg total) by mouth every 6 (six) hours as needed for moderate pain or severe pain. 08/23/19   Hinda Kehr, MD  triamcinolone ointment (KENALOG) 0.1 % Apply 1 application topically as directed. 05/09/18   [provider]    Allergies Codeine and Other  Family History  Problem Relation Age of Onset   Breast cancer Paternal Aunt    Other Father    Heart attack Father     Social History Social History   Tobacco Use   Smoking status: Former    Pack years: 0.00    Types: Cigarettes    Quit date: 02/01/2013    Years since quitting: 7.6   Smokeless tobacco: Never  Vaping Use   Vaping Use: Never used  Substance Use Topics   Alcohol use: No   Drug use: No      Review of Systems Constitutional: No fever/chills, positive for weakness Eyes: No visual changes. ENT: No sore throat. Cardiovascular: Denies chest pain. Respiratory: Cough, shortness of breath Gastrointestinal: Abdominal pain no nausea, no vomiting.  No diarrhea.  No constipation. Genitourinary: Positive dysuria Musculoskeletal: Negative for back pain. Skin: Negative for rash. Neurological: Negative for headaches, focal weakness or numbness. All other ROS negative ____________________________________________   PHYSICAL EXAM:  VITAL SIGNS: ED Triage Vitals  Enc Vitals Group     BP 10/05/20 0917 (!) 116/51     Pulse Rate 10/05/20 0917 66     Resp 10/05/20 0917 16     Temp 10/05/20 0917 98 F (36.7 C)     Temp Source 10/05/20 0917 Oral     SpO2 10/05/20 0917 97 %     Weight 10/05/20 0914 209 lb 7 oz (95 kg)     Height 10/05/20 0914 5\' 4"  (1.626 m)     Head Circumference --      Peak Flow --      Pain Score 10/05/20 0914 0     Pain Loc --      Pain Edu? --       Excl. in [place]? --     Constitutional: Alert and oriented.  Frequently coughing Eyes: Conjunctivae are normal. EOMI. Head: Atraumatic. Nose: No congestion/rhinnorhea. Mouth/Throat: Mucous membranes are moist.   Neck: No stridor. Trachea Midline. FROM Cardiovascular: Normal rate, regular  rhythm. Grossly normal heart sounds.  Good peripheral circulation. Respiratory: Normal respiratory effort.  No retractions. Lungs clear.  Gastrointestinal: Reports tenderness throughout her abdomen but mostly in the right lower quadrant no distention. No abdominal bruits.  Musculoskeletal: No lower extremity tenderness nor edema.  No joint effusions. Neurologic:  Normal speech and language. No gross focal neurologic deficits are appreciated.  Skin:  Skin is warm, dry and intact. No rash noted. Psychiatric: Mood and affect are normal. Speech and behavior are normal. GU: Deferred   ____________________________________________   LABS (all labs ordered are listed, but only abnormal results are displayed)  Labs Reviewed  LACTIC ACID, PLASMA - Abnormal; Notable for the following components:      Result Value   Lactic Acid, Venous 2.4 (*)    All other components within normal limits  COMPREHENSIVE METABOLIC PANEL - Abnormal; Notable for the following components:   Glucose, Bld 160 (*)    Calcium 8.6 (*)    Albumin 3.4 (*)    GFR, Estimated 60 (*)    All other components within normal limits  CBC WITH DIFFERENTIAL/PLATELET - Abnormal; Notable for the following components:   RBC 5.59 (*)    Hemoglobin 15.2 (*)    HCT 46.9 (*)    All other components within normal limits  LACTIC ACID, PLASMA  URINALYSIS, COMPLETE (UACMP) WITH MICROSCOPIC  BRAIN NATRIURETIC PEPTIDE  TROPONIN I (HIGH SENSITIVITY)   ____________________________________________   ED ECG REPORT I, Vanessa Bartlett, the attending physician, personally viewed and interpreted this ECG.  Atrial fibrillation rate of 62, no ST elevation, T  wave version in 1 and aVL, normal intervals ____________________________________________  RADIOLOGY Robert Bellow, personally viewed and evaluated these images (plain radiographs) as part of my medical decision making, as well as reviewing the written report by the radiologist.  ED MD interpretation:   No pneumonia  Official radiology report(s): DG Chest 2 View  Result Date: 10/05/2020 CLINICAL DATA:  COVID-19 positive, cough. EXAM: CHEST - 2 VIEW COMPARISON:  September 25, 2020. FINDINGS: The heart size and mediastinal contours are within normal limits. Both lungs are clear. The visualized skeletal structures are unremarkable. IMPRESSION: No active cardiopulmonary disease. Electronically Signed   By: Marijo Conception M.D.   On: 10/05/2020 09:45    ____________________________________________   PROCEDURES  Procedure(s) performed (including Critical Care):  Procedures   ____________________________________________   INITIAL IMPRESSION / ASSESSMENT AND PLAN / ED COURSE  Patricia Walker was evaluated in Emergency Department on 10/05/2020 for the symptoms described in the history of present illness. She was evaluated in the context of the global COVID-19 pandemic, which necessitated consideration that the patient might be at risk for infection with the SARS-CoV-2 virus that causes COVID-19. Institutional protocols and algorithms that pertain to the evaluation of patients at risk for COVID-19 are in a state of rapid change based on information released by regulatory bodies including the CDC and federal and state organizations. These policies and algorithms were followed during the patient's care in the ED.    Patient is a 75 year old who comes in with increasing weakness, cough, shortness of breath status post COVID.  Labs ordered to evaluate for [place] MIs, AKI.  Will get EKG and cardiac marker to make sure no evidence of ACS and get a CT PE to evaluate for pulmonary embolism given patient is not  improving after 2-week course of COVID.  Patient also reported lower abdominal pain and will get CT to make sure notes  of obstruction, appendicitis or other acute pathology.  We will give patient some fluids, Tylenol, cough medicine and reevaluate.  Patient has a history of A. fib and she is in her chronic A. fib but is rate controlled.  Labs are reassuring.  Initial cardiac marker is 20 but on repeat is still 20.  I do not think that this represents ACS given she never had any chest pain.  2:28 PM reevaluated patient discussed with the son over FaceTime.  Work-up is reassuring.  Discussed the CT showing nodules that they can follow-up with outpatient.  Patient son states is actually been about day 10 of COVID suspect a lot of her symptoms are still from her recovering from [place].  Will get ambulatory sat.  Ambulatory sat was 91 to 99%.  She does have some increased work of breathing which I suspect is still from the [place] but given the reassuring CT imaging and her sats stayed above 90% discussed with patient and family and they feel comfortable going home.  She is requesting additional cough syrup given she ran out.  I was unable to tell the cough syrup she was on but her's grandson sent a picture and I refilled this cough syrup since she states that it worked well for her.  At this time they feel comfortable going home and will return if the shortness of breath is getting worse or there is any other concerns  Discussed with patient that the cough syrup she is on can cause sedation she states that she only uses it at nighttime.   I discussed the provisional nature of ED diagnosis, the treatment so far, the ongoing plan of care, follow up appointments and return precautions with the patient and any family or support people present. They expressed understanding and agreed with the plan, discharged home.       ____________________________________________   FINAL CLINICAL IMPRESSION(S) / ED  DIAGNOSES   Final diagnoses:  Cough  Weakness  COVID-19      MEDICATIONS GIVEN DURING THIS VISIT:  Medications  guaiFENesin (ROBITUSSIN) 100 MG/5ML solution 200 mg (200 mg Oral Given 10/05/20 1102)  sodium chloride 0.9 % bolus 500 mL (0 mLs Intravenous Stopped 10/05/20 1126)  acetaminophen (TYLENOL) tablet 1,000 mg (1,000 mg Oral Given 10/05/20 1102)  ipratropium-albuterol (DUONEB) 0.5-2.5 (3) MG/3ML nebulizer solution 3 mL (3 mLs Nebulization Given 10/05/20 1102)  iohexol (OMNIPAQUE) 350 MG/ML injection 100 mL (100 mLs Intravenous Contrast Given 10/05/20 1141)     ED Discharge Orders          Ordered    promethazine-phenylephrine (PROMETHAZINE VC PLAIN) 6.25-5 MG/5ML SYRP  Every 4 hours PRN        10/05/20 1541             Note:  This document was prepared using Dragon voice recognition software and [place] include unintentional dictation errors.    Vanessa [place], MD 10/05/20 1550

## 2020-10-05 NOTE — Discharge Instructions (Addendum)
Continue to use your inhalers at home.  I prescribed some more of your cough medicine.  Return to the ER if develop worsening shortness of breath, worsening weakness or any other concerns otherwise follow-up with your primary care doctor    IMPRESSION: No definite evidence of pulmonary embolus.   Coronary artery calcifications are noted.   Two right-sided pulmonary nodules are noted, the largest measuring 4 mm. No follow-up needed if patient is low-risk (and has no known or suspected primary neoplasm). Non-contrast chest CT can be considered in 12 months if patient is high-risk. This recommendation follows the consensus statement: Guidelines for Management of Incidental Pulmonary Nodules Detected on CT Images: From the Fleischner Society 2017; Radiology 2017; 284:228-243.   No acute abnormality seen in the abdomen or pelvis.   Aortic Atherosclerosis (ICD10-I70.0).

## 2020-10-05 NOTE — ED Notes (Signed)
Pt has completed her breathing treatment, call bell given to pt, no distress noted, cont to monitor

## 2020-10-05 NOTE — ED Notes (Signed)
Pt ambulated with a pulse ox and pt maintained sats of 91-99% while ambulating, pt's sat of 91% lasted for a second and moved to 95% without a change in activity, by the time the pt ambulated back to the room her O2 sat was noted to be 99%. Pt does sound labored to breathe while up moving about

## 2020-10-05 NOTE — ED Triage Notes (Signed)
Diagnosed with COVID a couple of weeks ago.  Arrives today with c/o persistent cough and feeling weak.  Denies fever.

## 2020-10-08 ENCOUNTER — Inpatient Hospital Stay
Admission: EM | Admit: 2020-10-08 | Discharge: 2020-10-14 | DRG: 191 | Disposition: A | Discharge status: Home Health | Payer: Medicare HMO | Attending: Student | Admitting: Student

## 2020-10-08 ENCOUNTER — Emergency Department: Payer: Medicare HMO

## 2020-10-08 ENCOUNTER — Encounter: Payer: Self-pay | Admitting: Emergency Medicine

## 2020-10-08 ENCOUNTER — Other Ambulatory Visit: Payer: Self-pay

## 2020-10-08 DIAGNOSIS — L409 Psoriasis, unspecified: Secondary | ICD-10-CM | POA: Diagnosis present

## 2020-10-08 DIAGNOSIS — G8929 Other chronic pain: Secondary | ICD-10-CM | POA: Diagnosis present

## 2020-10-08 DIAGNOSIS — Z7952 Long term (current) use of systemic steroids: Secondary | ICD-10-CM | POA: Diagnosis not present

## 2020-10-08 DIAGNOSIS — I2510 Atherosclerotic heart disease of native coronary artery without angina pectoris: Secondary | ICD-10-CM | POA: Diagnosis present

## 2020-10-08 DIAGNOSIS — Z79899 Other long term (current) drug therapy: Secondary | ICD-10-CM

## 2020-10-08 DIAGNOSIS — F32A Depression, unspecified: Secondary | ICD-10-CM | POA: Diagnosis present

## 2020-10-08 DIAGNOSIS — K219 Gastro-esophageal reflux disease without esophagitis: Secondary | ICD-10-CM | POA: Diagnosis present

## 2020-10-08 DIAGNOSIS — J441 Chronic obstructive pulmonary disease with (acute) exacerbation: Secondary | ICD-10-CM

## 2020-10-08 DIAGNOSIS — E039 Hypothyroidism, unspecified: Secondary | ICD-10-CM | POA: Diagnosis present

## 2020-10-08 DIAGNOSIS — Z8249 Family history of ischemic heart disease and other diseases of the circulatory system: Secondary | ICD-10-CM | POA: Diagnosis not present

## 2020-10-08 DIAGNOSIS — Z79890 Hormone replacement therapy (postmenopausal): Secondary | ICD-10-CM | POA: Diagnosis not present

## 2020-10-08 DIAGNOSIS — R0602 Shortness of breath: Secondary | ICD-10-CM | POA: Diagnosis not present

## 2020-10-08 DIAGNOSIS — E785 Hyperlipidemia, unspecified: Secondary | ICD-10-CM

## 2020-10-08 DIAGNOSIS — J209 Acute bronchitis, unspecified: Secondary | ICD-10-CM | POA: Diagnosis present

## 2020-10-08 DIAGNOSIS — R519 Headache, unspecified: Secondary | ICD-10-CM | POA: Diagnosis present

## 2020-10-08 DIAGNOSIS — Z7901 Long term (current) use of anticoagulants: Secondary | ICD-10-CM | POA: Diagnosis not present

## 2020-10-08 DIAGNOSIS — F419 Anxiety disorder, unspecified: Secondary | ICD-10-CM | POA: Diagnosis present

## 2020-10-08 DIAGNOSIS — Z8616 Personal history of COVID-19: Secondary | ICD-10-CM | POA: Diagnosis not present

## 2020-10-08 DIAGNOSIS — R059 Cough, unspecified: Secondary | ICD-10-CM | POA: Diagnosis not present

## 2020-10-08 DIAGNOSIS — Z87891 Personal history of nicotine dependence: Secondary | ICD-10-CM | POA: Diagnosis not present

## 2020-10-08 DIAGNOSIS — R030 Elevated blood-pressure reading, without diagnosis of hypertension: Secondary | ICD-10-CM | POA: Diagnosis present

## 2020-10-08 DIAGNOSIS — J440 Chronic obstructive pulmonary disease with acute lower respiratory infection: Secondary | ICD-10-CM | POA: Diagnosis present

## 2020-10-08 DIAGNOSIS — I700 Atherosclerosis of aorta: Secondary | ICD-10-CM | POA: Diagnosis not present

## 2020-10-08 DIAGNOSIS — I252 Old myocardial infarction: Secondary | ICD-10-CM | POA: Diagnosis not present

## 2020-10-08 DIAGNOSIS — G4733 Obstructive sleep apnea (adult) (pediatric): Secondary | ICD-10-CM | POA: Diagnosis present

## 2020-10-08 DIAGNOSIS — Z885 Allergy status to narcotic agent status: Secondary | ICD-10-CM | POA: Diagnosis not present

## 2020-10-08 DIAGNOSIS — I4819 Other persistent atrial fibrillation: Secondary | ICD-10-CM | POA: Diagnosis present

## 2020-10-08 DIAGNOSIS — U071 COVID-19: Secondary | ICD-10-CM | POA: Diagnosis not present

## 2020-10-08 HISTORY — DX: Chronic obstructive pulmonary disease with (acute) exacerbation: J44.1

## 2020-10-08 LAB — CBC
HCT: 44.6 % (ref 36.0–46.0)
Hemoglobin: 14.9 g/dL (ref 12.0–15.0)
MCH: 28.1 pg (ref 26.0–34.0)
MCHC: 33.4 g/dL (ref 30.0–36.0)
MCV: 84.2 fL (ref 80.0–100.0)
Platelets: 285 10*3/uL (ref 150–400)
RBC: 5.3 MIL/uL — ABNORMAL HIGH (ref 3.87–5.11)
RDW: 13.7 % (ref 11.5–15.5)
WBC: 8.8 10*3/uL (ref 4.0–10.5)
nRBC: 0 % (ref 0.0–0.2)

## 2020-10-08 LAB — URINALYSIS, COMPLETE (UACMP) WITH MICROSCOPIC
Bacteria, UA: NONE SEEN
Bilirubin Urine: NEGATIVE
Glucose, UA: NEGATIVE mg/dL
Hgb urine dipstick: NEGATIVE
Ketones, ur: NEGATIVE mg/dL
Leukocytes,Ua: NEGATIVE
Nitrite: NEGATIVE
Protein, ur: NEGATIVE mg/dL
Specific Gravity, Urine: 1.025 (ref 1.005–1.030)
pH: 5 (ref 5.0–8.0)

## 2020-10-08 LAB — BASIC METABOLIC PANEL
Anion gap: 8 (ref 5–15)
BUN: 24 mg/dL — ABNORMAL HIGH (ref 8–23)
CO2: 24 mmol/L (ref 22–32)
Calcium: 8.9 mg/dL (ref 8.9–10.3)
Chloride: 105 mmol/L (ref 98–111)
Creatinine, Ser: 0.83 mg/dL (ref 0.44–1.00)
GFR, Estimated: >60 mL/min (ref >60)
Glucose, Bld: 183 mg/dL — ABNORMAL HIGH (ref 70–99)
Potassium: 4.4 mmol/L (ref 3.5–5.1)
Sodium: 137 mmol/L (ref 135–145)

## 2020-10-08 LAB — BRAIN NATRIURETIC PEPTIDE: B Natriuretic Peptide: 187.4 pg/mL — ABNORMAL HIGH (ref 0.0–100.0)

## 2020-10-08 LAB — RESP PANEL BY RT-PCR (FLU A&B, COVID) ARPGX2
Influenza A by PCR: NEGATIVE
Influenza B by PCR: NEGATIVE
SARS Coronavirus 2 by RT PCR: POSITIVE — AB

## 2020-10-08 LAB — TROPONIN I (HIGH SENSITIVITY)
Troponin I (High Sensitivity): 18 ng/L — ABNORMAL HIGH (ref <18)
Troponin I (High Sensitivity): 11 ng/L (ref <18)

## 2020-10-08 MED ORDER — APIXABAN 5 MG PO TABS
5.0000 mg | ORAL_TABLET | Freq: Two times a day (BID) | ORAL | Status: DC
  Administered 2020-10-08 – 2020-10-14 (×12): 5 mg via ORAL
  Filled 2020-10-08 (×12): qty 1

## 2020-10-08 MED ORDER — GUAIFENESIN ER 600 MG PO TB12
600.0000 mg | ORAL_TABLET | Freq: Two times a day (BID) | ORAL | Status: DC
  Administered 2020-10-08 – 2020-10-14 (×12): 600 mg via ORAL
  Filled 2020-10-08 (×12): qty 1

## 2020-10-08 MED ORDER — FUROSEMIDE 20 MG PO TABS: 20.0000 mg | ORAL_TABLET | Freq: Every day | ORAL | Status: DC | PRN

## 2020-10-08 MED ORDER — IPRATROPIUM-ALBUTEROL 0.5-2.5 (3) MG/3ML IN SOLN
3.0000 mL | Freq: Four times a day (QID) | RESPIRATORY_TRACT | Status: DC
Start: 2020-10-09 — End: 2020-10-10
  Administered 2020-10-09 (×4): 3 mL via RESPIRATORY_TRACT
  Filled 2020-10-08 (×3): qty 3

## 2020-10-08 MED ORDER — DOXEPIN HCL 25 MG PO CAPS: 25.0000 mg | ORAL_CAPSULE | Freq: Every day | ORAL | Status: DC

## 2020-10-08 MED ORDER — METHYLPREDNISOLONE SODIUM SUCC 125 MG IJ SOLR
125.0000 mg | Freq: Once | INTRAMUSCULAR | Status: AC
  Administered 2020-10-08: 125 mg via INTRAVENOUS
  Filled 2020-10-08: qty 2

## 2020-10-08 MED ORDER — BACLOFEN 10 MG PO TABS: 10.0000 mg | ORAL_TABLET | Freq: Two times a day (BID) | ORAL | Status: DC

## 2020-10-08 MED ORDER — FOLIC ACID 1 MG PO TABS
1.0000 mg | ORAL_TABLET | Freq: Every day | ORAL | Status: DC
  Administered 2020-10-09 – 2020-10-14 (×6): 1 mg via ORAL
  Filled 2020-10-08 (×6): qty 1

## 2020-10-08 MED ORDER — TRAMADOL HCL 50 MG PO TABS
100.0000 mg | ORAL_TABLET | Freq: Four times a day (QID) | ORAL | Status: DC | PRN
  Administered 2020-10-09: 50 mg via ORAL
  Administered 2020-10-09: 100 mg via ORAL
  Filled 2020-10-08 (×2): qty 2

## 2020-10-08 MED ORDER — IPRATROPIUM-ALBUTEROL 0.5-2.5 (3) MG/3ML IN SOLN: 3.0000 mL | RESPIRATORY_TRACT | Status: DC | PRN

## 2020-10-08 MED ORDER — AZELASTINE HCL 0.1 % NA SOLN
2.0000 | Freq: Two times a day (BID) | NASAL | Status: DC | PRN
  Filled 2020-10-08: qty 30

## 2020-10-08 MED ORDER — ONDANSETRON HCL 4 MG/2ML IJ SOLN: 4.0000 mg | Freq: Four times a day (QID) | INTRAMUSCULAR | Status: DC | PRN

## 2020-10-08 MED ORDER — MAGNESIUM HYDROXIDE 400 MG/5ML PO SUSP: 30.0000 mL | Freq: Every day | ORAL | Status: DC | PRN

## 2020-10-08 MED ORDER — IPRATROPIUM-ALBUTEROL 0.5-2.5 (3) MG/3ML IN SOLN
3.0000 mL | Freq: Once | RESPIRATORY_TRACT | Status: AC
  Administered 2020-10-08: 3 mL via RESPIRATORY_TRACT
  Filled 2020-10-08: qty 3

## 2020-10-08 MED ORDER — MONTELUKAST SODIUM 10 MG PO TABS
10.0000 mg | ORAL_TABLET | Freq: Every day | ORAL | Status: DC
  Administered 2020-10-08 – 2020-10-13 (×6): 10 mg via ORAL
  Filled 2020-10-08 (×5): qty 1

## 2020-10-08 MED ORDER — TRAZODONE HCL 50 MG PO TABS: 25.0000 mg | ORAL_TABLET | Freq: Every evening | ORAL | Status: DC | PRN

## 2020-10-08 MED ORDER — SODIUM CHLORIDE 0.9 % IV SOLN
2.0000 g | Freq: Once | INTRAVENOUS | Status: AC
  Administered 2020-10-08: 2 g via INTRAVENOUS
  Filled 2020-10-08: qty 20

## 2020-10-08 MED ORDER — ASCORBIC ACID 500 MG PO TABS
500.0000 mg | ORAL_TABLET | Freq: Every day | ORAL | Status: DC
  Administered 2020-10-09 – 2020-10-14 (×6): 500 mg via ORAL
  Filled 2020-10-08 (×6): qty 1

## 2020-10-08 MED ORDER — ONDANSETRON 4 MG PO TBDP: 4.0000 mg | ORAL_TABLET | Freq: Three times a day (TID) | ORAL | Status: DC | PRN

## 2020-10-08 MED ORDER — ATORVASTATIN CALCIUM 20 MG PO TABS
20.0000 mg | ORAL_TABLET | Freq: Every evening | ORAL | Status: DC
  Administered 2020-10-08 – 2020-10-13 (×6): 20 mg via ORAL
  Filled 2020-10-08 (×6): qty 1

## 2020-10-08 MED ORDER — VITAMIN D 25 MCG (1000 UNIT) PO TABS
1000.0000 [IU] | ORAL_TABLET | Freq: Every day | ORAL | Status: DC
  Administered 2020-10-09 – 2020-10-14 (×6): 1000 [IU] via ORAL
  Filled 2020-10-08 (×6): qty 1

## 2020-10-08 MED ORDER — BUTALBITAL-APAP-CAFFEINE 50-325-40 MG PO TABS
1.0000 | ORAL_TABLET | Freq: Four times a day (QID) | ORAL | Status: DC | PRN
  Administered 2020-10-09: 1 via ORAL
  Filled 2020-10-08: qty 1

## 2020-10-08 MED ORDER — PREDNISONE 20 MG PO TABS
40.0000 mg | ORAL_TABLET | Freq: Every day | ORAL | Status: DC
  Administered 2020-10-10: 40 mg via ORAL
  Filled 2020-10-08: qty 2

## 2020-10-08 MED ORDER — ACETAMINOPHEN 325 MG PO TABS
650.0000 mg | ORAL_TABLET | Freq: Four times a day (QID) | ORAL | Status: DC | PRN
  Administered 2020-10-09: 650 mg via ORAL
  Filled 2020-10-08: qty 2

## 2020-10-08 MED ORDER — GABAPENTIN 300 MG PO CAPS: 300.0000 mg | ORAL_CAPSULE | Freq: Two times a day (BID) | ORAL | Status: DC

## 2020-10-08 MED ORDER — ENOXAPARIN SODIUM 40 MG/0.4ML IJ SOSY: 40.0000 mg | PREFILLED_SYRINGE | INTRAMUSCULAR | Status: DC

## 2020-10-08 MED ORDER — ACETAMINOPHEN 650 MG RE SUPP: 650.0000 mg | Freq: Four times a day (QID) | RECTAL | Status: DC | PRN

## 2020-10-08 MED ORDER — ONDANSETRON HCL 4 MG PO TABS: 4.0000 mg | ORAL_TABLET | Freq: Four times a day (QID) | ORAL | Status: DC | PRN

## 2020-10-08 MED ORDER — PROMETHAZINE-PHENYLEPHRINE 6.25-5 MG/5ML PO SYRP: 5.0000 mL | ORAL_SOLUTION | ORAL | Status: DC | PRN

## 2020-10-08 MED ORDER — LEVOTHYROXINE SODIUM 50 MCG PO TABS
50.0000 ug | ORAL_TABLET | Freq: Every day | ORAL | Status: DC
  Administered 2020-10-09 – 2020-10-14 (×6): 50 ug via ORAL
  Filled 2020-10-08 (×6): qty 1

## 2020-10-08 MED ORDER — BENZONATATE 100 MG PO CAPS: 100.0000 mg | ORAL_CAPSULE | Freq: Four times a day (QID) | ORAL | Status: DC | PRN

## 2020-10-08 MED ORDER — ZINC SULFATE 220 (50 ZN) MG PO CAPS
220.0000 mg | ORAL_CAPSULE | Freq: Every day | ORAL | Status: DC
  Administered 2020-10-09 – 2020-10-14 (×6): 220 mg via ORAL
  Filled 2020-10-08 (×6): qty 1

## 2020-10-08 MED ORDER — TRIAMCINOLONE ACETONIDE 0.1 % EX OINT
1.0000 "application " | TOPICAL_OINTMENT | CUTANEOUS | Status: DC
  Filled 2020-10-08: qty 15

## 2020-10-08 MED ORDER — SODIUM CHLORIDE 0.9 % IV BOLUS
500.0000 mL | Freq: Once | INTRAVENOUS | Status: AC
  Administered 2020-10-08: 500 mL via INTRAVENOUS

## 2020-10-08 MED ORDER — METHYLPREDNISOLONE SODIUM SUCC 40 MG IJ SOLR
40.0000 mg | Freq: Four times a day (QID) | INTRAMUSCULAR | Status: AC
  Administered 2020-10-09 (×4): 40 mg via INTRAVENOUS
  Filled 2020-10-08 (×4): qty 1

## 2020-10-08 MED ORDER — SODIUM CHLORIDE 0.9 % IV SOLN
500.0000 mg | INTRAVENOUS | Status: DC
  Administered 2020-10-08 – 2020-10-09 (×2): 500 mg via INTRAVENOUS
  Filled 2020-10-08 (×3): qty 500

## 2020-10-08 MED ORDER — SODIUM CHLORIDE 0.9 % IV SOLN: 2.0000 g | INTRAVENOUS | Status: DC

## 2020-10-08 MED ORDER — METHOTREXATE 2.5 MG PO TABS: 10.0000 mg | ORAL_TABLET | ORAL | Status: DC

## 2020-10-08 MED ORDER — PREGABALIN 50 MG PO CAPS: 50.0000 mg | ORAL_CAPSULE | Freq: Every day | ORAL | Status: DC | PRN

## 2020-10-08 MED ORDER — PANTOPRAZOLE SODIUM 40 MG PO TBEC: 40.0000 mg | DELAYED_RELEASE_TABLET | Freq: Every day | ORAL | Status: DC

## 2020-10-08 MED ORDER — PAROXETINE HCL 20 MG PO TABS
20.0000 mg | ORAL_TABLET | Freq: Every day | ORAL | Status: DC
  Administered 2020-10-09 – 2020-10-14 (×6): 20 mg via ORAL
  Filled 2020-10-08 (×6): qty 1

## 2020-10-08 MED ORDER — SODIUM CHLORIDE 0.9 % IV SOLN: INTRAVENOUS | Status: DC

## 2020-10-08 NOTE — ED Triage Notes (Signed)
Pt comes into the ED via POV c/o generalized weakness that has gotten worse over the past couple weeks.  Pt states she was diagnosed with COVID a couple weeks ago and has progressively gotten worse.  PT also admits to increased SHOB and nausea.  Pt currently has even and unlabored respirations and is in NAD.

## 2020-10-08 NOTE — ED Notes (Signed)
Pharmacy tech at bedside, phlebotomy requested for blood draw

## 2020-10-08 NOTE — H&P (Addendum)
[place]   PATIENT NAME: Patricia Walker    MR#:  [id number]  DATE OF BIRTH:  07-18-1945  DATE OF ADMISSION:  10/08/2020  PRIMARY CARE PHYSICIAN: Perrin Maltese, MD   Patient is coming from: Home  REQUESTING/REFERRING PHYSICIAN: Lannie Fields, PA-C  CHIEF COMPLAINT:   Chief Complaint  Patient presents with  . Weakness    HISTORY OF PRESENT ILLNESS:  Patricia Walker is a 75 y.o. Caucasian female with medical history significant for asthma, coronary artery disease, depression, hypertension, dyslipidemia, hypothyroidism and obstructive sleep apnea on CPAP, who presented to the ER with acute onset of worsening generalized weakness with associated dyspnea, cough productive of greenish sputum and wheezing.  She was seen twice in the ER for similar symptoms without improvement.   She admits to dyspnea on exertion.  She has been up at night due to her excessive cough despite cough suppressant.  She is unable to take care of her grandchildren.  No fever or chills.  She admits to nausea without vomiting or abdominal pain.  She admits to dysuria, without oliguria, urinary frequency or hematuria or flank pain.  No headache or dizziness or blurred vision.  Patient had a positive COVID with 19 infection about 3 weeks ago.  ED Course: Blood pressure was 122/105 and heart rate 105 with otherwise normal vital signs when she came to the ER.  Labs revealed a BUN of 24 with a blood glucose of 183.  BNP was 187.4 and high-sensitivity troponin I was 18.  CBC was within normal.  UA was unremarkable.  COVID-19 PCR came back positive.  EKG as reviewed by me : Showed atrial fibrillation with controlled ventricular sponsor of 82 with moderate voltage criteria for LVH, slightly poor R wave progression and T wave inversion laterally. Imaging: Two-view chest X ray showed no acute cardiopulmonary disease. 3 days ago the patient had a negative chest CTA for PE or pneumonia.  It showed 2 pulmonary nodules that can  be followed up on an outpatient basis.  The patient was given IV Rocephin and Zithromax as well as DuoNeb's and IV Solu-Medrol.  She will be admitted to a medically monitored bed for further evaluation and management. PAST MEDICAL HISTORY:   Past Medical History:  Diagnosis Date  . Anxiety   . Arthritis   . Asthma   . Coronary artery disease    mild, nonobstructive  . Depression   . Dyspnea    on exertion  . Dysrhythmia    Atrial Fibrillation  . GERD (gastroesophageal reflux disease)   . Heart murmur   . Hyperlipidemia   . Hypertension   . Hypothyroidism   . MI, old 2016   nonobstructive CAD by Cath  . Morbid obesity ([place])   . Persistent atrial fibrillation ([place])   . Pneumonia 06/2018   and RSV  . Psoriasis   . Sleep apnea    compliant with CPAP  . Thyroid disease     PAST SURGICAL HISTORY:   Past Surgical History:  Procedure Laterality Date  . ARTERY BIOPSY Right 07/21/2017   Procedure: BIOPSY TEMPORAL ARTERY;  Surgeon: [name], MD;  Location: [facility];  Service: Vascular;  Laterality: Right;  . CARDIAC CATHETERIZATION    . CARDIOVERSION Right 09/01/2016   Procedure: Cardioversion;  Surgeon: [name], MD;  Location: [facility];  Service: Cardiovascular;  Laterality: Right;  . CARDIOVERSION N/A 09/09/2016   Procedure: Cardioversion;  Surgeon: [name], MD;  Location: [facility];  Service: Cardiovascular;  Laterality: N/A;  . CATARACT EXTRACTION W/PHACO Right 03/26/2019   Procedure: CATARACT EXTRACTION PHACO AND INTRAOCULAR LENS PLACEMENT (IOC) RIGHT 6.45, 00:39.9;  Surgeon: Birder Robson, MD;  Location: [facility];  Service: Ophthalmology;  Laterality: Right;  . CATARACT EXTRACTION W/PHACO Left 04/16/2019   Procedure: CATARACT EXTRACTION PHACO AND INTRAOCULAR LENS PLACEMENT (IOC) LEFT;   3.14, 00:24.9;  Surgeon: Birder Robson, MD;  Location: [place];  Service: Ophthalmology;  Laterality: Left;  sleep apnea-CPAP  .  COLONOSCOPY WITH PROPOFOL N/A 08/28/2019   Procedure: COLONOSCOPY WITH PROPOFOL;  Surgeon: Toledo, Benay Pike, MD;  Location: [facility];  Service: Gastroenterology;  Laterality: N/A;  . ELECTROPHYSIOLOGIC STUDY N/A 02/01/2016   Procedure: CARDIOVERSION;  Surgeon: [name], MD;  Location: [facility];  Service: Cardiovascular;  Laterality: N/A;  . ESOPHAGEAL DILATION    . ESOPHAGOGASTRODUODENOSCOPY (EGD) WITH PROPOFOL N/A 02/06/2017   Procedure: ESOPHAGOGASTRODUODENOSCOPY (EGD) WITH PROPOFOL;  Surgeon: [name], MD;  Location: [facility];  Service: Gastroenterology;  Laterality: N/A;  . ESOPHAGOGASTRODUODENOSCOPY (EGD) WITH PROPOFOL N/A 08/28/2019   Procedure: ESOPHAGOGASTRODUODENOSCOPY (EGD) WITH PROPOFOL;  Surgeon: Toledo, Benay Pike, MD;  Location: [facility];  Service: Gastroenterology;  Laterality: N/A;  . EUS N/A 02/16/2017   Procedure: FULL UPPER ENDOSCOPIC ULTRASOUND (EUS) RADIAL;  Surgeon: [name], MD;  Location: [facility];  Service: Gastroenterology;  Laterality: N/A;  . LEFT HEART CATH AND CORONARY ANGIOGRAPHY N/A 09/08/2016   Procedure: Left Heart Cath and Coronary Angiography;  Surgeon: [name], MD;  Location: [place] [facility];  Service: Cardiovascular;  Laterality: N/A;  . US ECHOCARDIOGRAPHY      SOCIAL HISTORY:   Social History   Tobacco Use  . Smoking status: Former    Pack years: 0.00    Types: Cigarettes    Quit date: 02/01/2013    Years since quitting: 7.6  . Smokeless tobacco: Never  Substance Use Topics  . Alcohol use: No    FAMILY HISTORY:   Family History  Problem Relation Age of Onset  . Breast cancer Paternal Aunt   . Other Father   . Heart attack Father     DRUG ALLERGIES:   Allergies  Allergen Reactions  . Codeine Hives, Itching and Rash  . Other Itching and Other (See Comments)    States antibiotic in the past caused itching but can not remember name    REVIEW OF SYSTEMS:   ROS As per history of  present illness. All pertinent systems were reviewed above. Constitutional, HEENT, cardiovascular, respiratory, GI, GU, musculoskeletal, neuro, psychiatric, endocrine, integumentary and hematologic systems were reviewed and are otherwise negative/unremarkable except for positive findings mentioned above in the HPI.   MEDICATIONS AT HOME:   Prior to Admission medications   Medication Sig Start Date End Date Taking? Authorizing Provider  albuterol (VENTOLIN HFA) 108 (90 Base) MCG/ACT inhaler Inhale 2 puffs into the lungs every 6 (six) hours as needed for wheezing or shortness of breath. 09/25/20   Cuthriell, Charline Bills, PA-C  apixaban (ELIQUIS) 5 MG TABS tablet Take 5 mg by mouth 2 (two) times daily.    [provider]  atorvastatin (LIPITOR) 20 MG tablet Take 20 mg by mouth every evening. 08/25/16   [provider]  azelastine (ASTELIN) 0.1 % nasal spray Place 2 sprays into both nostrils 2 (two) times daily as needed for rhinitis or allergies.  10/13/16   [provider]  baclofen (LIORESAL) 10 MG tablet Take 10  mg by mouth 2 (two) times daily. 06/13/17   [provider]  benzonatate (TESSALON PERLES) 100 MG capsule Take 1 capsule (100 mg total) by mouth every 6 (six) hours as needed for cough. 09/25/20 09/25/21  Cuthriell, Charline Bills, PA-C  budesonide-formoterol (SYMBICORT) 160-4.5 MCG/ACT inhaler Inhale 2 puffs into the lungs 2 (two) times daily.     [provider]  butalbital-acetaminophen-caffeine (FIORICET) [id number] MG tablet Take 1 tablet by mouth every 6 (six) hours as needed for headache. 09/25/20 09/25/21  Cuthriell, Charline Bills, PA-C  doxepin (SINEQUAN) 25 MG capsule Take 25 mg by mouth at bedtime.    [provider]  esomeprazole (NEXIUM) 40 MG capsule Take 40 mg by mouth daily. 07/01/17   [provider]  folic acid (FOLVITE) 1 MG tablet Take 1 mg by mouth daily.    [provider]  furosemide (LASIX) 20 MG tablet Take 20 mg  by mouth daily as needed for fluid. 08/24/16   [provider]  gabapentin (NEURONTIN) 300 MG capsule Take 300 mg by mouth 2 (two) times daily.    [provider]  levofloxacin (LEVAQUIN) 500 MG tablet Take 1 tablet (500 mg total) by mouth daily. 06/18/18   Hillary Bow, MD  levothyroxine (SYNTHROID, LEVOTHROID) 50 MCG tablet Take 50 mcg by mouth daily before breakfast.    [provider]  methotrexate (RHEUMATREX) 2.5 MG tablet Take 10 mg by mouth once a week. Caution:Chemotherapy. Protect from light.    [provider]  montelukast (SINGULAIR) 10 MG tablet Take 10 mg by mouth at bedtime.  05/26/17   [provider]  ondansetron (ZOFRAN-ODT) 4 MG disintegrating tablet Take 1 tablet (4 mg total) by mouth every 8 (eight) hours as needed for nausea or vomiting. 09/25/20   Cuthriell, Charline Bills, PA-C  PARoxetine (PAXIL) 20 MG tablet Take 20 mg by mouth daily.    [provider]  predniSONE (DELTASONE) 50 MG tablet Take 1 tablet (50 mg total) by mouth daily with breakfast. 09/25/20   Cuthriell, Charline Bills, PA-C  pregabalin (LYRICA) 50 MG capsule Take 50 mg by mouth daily as needed for pain.    [provider]  promethazine-phenylephrine (PROMETHAZINE VC PLAIN) 6.25-5 MG/5ML SYRP Take 5 mLs by mouth every 4 (four) hours as needed for up to 5 days for congestion. 10/05/20 10/10/20  Vanessa Portage, MD  ranitidine (ZANTAC) 150 MG tablet Take 150 mg by mouth 2 (two) times daily. 04/27/17   [provider]  SKYRIZI, 150 MG DOSE, 75 MG/0.83ML PSKT Inject 1 Dose into the skin every 3 (three) months. 05/09/18   [provider]  traMADol (ULTRAM) 50 MG tablet Take 2 tablets (100 mg total) by mouth every 6 (six) hours as needed for moderate pain or severe pain. 08/23/19   Hinda Kehr, MD  triamcinolone ointment (KENALOG) 0.1 % Apply 1 application topically as directed. 05/09/18   [provider]      VITAL SIGNS:  Blood pressure (!)  122/105, pulse 90, temperature 98.5 F (36.9 C), temperature source Oral, resp. rate 20, height 5\' 4"  (1.626 m), weight 131.5 kg, SpO2 99 %.  PHYSICAL EXAMINATION:  Physical Exam  GENERAL:  75 y.o.-year-old Caucasian female patient lying in the bed with no acute distress.  EYES: Pupils equal, round, reactive to light and accommodation. No scleral icterus. Extraocular muscles intact.  HEENT: Head atraumatic, normocephalic. Oropharynx and nasopharynx clear.  NECK:  Supple, no jugular venous distention. No thyroid enlargement, no tenderness.  LUNGS: Diffuse expiratory wheezes with tight expiratory airflow and harsh vesicular breathing. CARDIOVASCULAR: Regular rate and rhythm, S1, S2 normal. No murmurs, rubs, or gallops.  ABDOMEN: Soft, nondistended, nontender. Bowel sounds present. No organomegaly or mass.  EXTREMITIES: No pedal edema, cyanosis, or clubbing.  NEUROLOGIC: Cranial nerves II through XII are intact. Muscle strength 5/5 in all extremities. Sensation intact. Gait not checked.  PSYCHIATRIC: The patient is alert and oriented x 3.  Normal affect and good eye contact. SKIN: No obvious rash, lesion, or ulcer.   LABORATORY PANEL:   CBC Recent Labs  Lab 10/08/20 1551  WBC 8.8  HGB 14.9  HCT 44.6  PLT 285   ------------------------------------------------------------------------------------------------------------------  Chemistries  Recent Labs  Lab 10/05/20 0920 10/08/20 1551  NA 138 137  K 4.1 4.4  CL 102 105  CO2 28 24  GLUCOSE 160* 183*  BUN 19 24*  CREATININE 0.99 0.83  CALCIUM 8.6* 8.9  AST 29  --   ALT 31  --   ALKPHOS 78  --   BILITOT 0.8  --    ------------------------------------------------------------------------------------------------------------------  Cardiac Enzymes No results for input(s): TROPONINI in the last 168  hours. ------------------------------------------------------------------------------------------------------------------  RADIOLOGY:  DG Chest 2 View  Result Date: 10/08/2020 CLINICAL DATA:  Cough and shortness of breath. EXAM: CHEST - 2 VIEW COMPARISON:  October 05, 2020 FINDINGS: The heart size and mediastinal contours are within normal limits. Mild calcification of the aortic arch is seen. Both lungs are clear. The visualized skeletal structures are unremarkable. IMPRESSION: No active cardiopulmonary disease. Electronically Signed   By: Virgina Norfolk M.D.   On: 10/08/2020 18:41      IMPRESSION AND PLAN:  Active Problems:   COPD exacerbation ([place])  1.  COPD acute exacerbation likely secondary to acute bronchitis with recent COVID-19 infection. - The patient will be admitted to a medical monitored bed. - We will continue steroid therapy with IV Solu-Medrol and bronchodilator therapy with DuoNebs on a scheduled and as needed doses. - We will continue antibiotic therapy with IV Rocephin and Zithromax. - Mucolytic therapy will be provided. - Symbicort to be held off. - We will follow blood and sputum culture. -The patient had COVID-19 3 weeks ago and I do not believe she needs isolation. - We will place her on vitamin D3, vitamin C and zinc sulfate. - The patient had a negative chest CTA 3 days ago.    2.  Dyslipidemia. - We will continue statin therapy.  3.  Hypothyroidism. - We will continue Synthroid.  4.  Obstructive sleep apnea. - We will resume her CPAP nightly.  5.  Chronic atrial fibrillation with controlled ventricular response. - We will continue p.o. Eliquis.  6.  Depression. - We will continue Paxil.  7.  Psoriasis. - She is maintained on [employer].  DVT prophylaxis: We will continue Eliquis. Code Status: full code. Family Communication:  The plan of care was discussed in details with the patient (and family). I answered all questions. The patient agreed to  proceed with the above mentioned plan. Further management will depend upon hospital course. Disposition Plan: Back to previous home environment Consults called: none. All the records are reviewed and case discussed with ED provider.  Status is: Inpatient  Remains inpatient appropriate because:Ongoing diagnostic testing needed not appropriate for outpatient work up, Unsafe d/c plan, IV treatments appropriate due to intensity of illness or inability to take PO, and Inpatient level of care appropriate due to severity of illness  Dispo: The patient  is from: Home              Anticipated d/c is to: Home              Patient currently is not medically stable to d/c.   Difficult to place patient No   TOTAL TIME TAKING CARE OF THIS PATIENT: 55 minutes.    Christel Mormon M.D on 10/08/2020 at 8:16 PM  [facility]   From 7 PM-7 AM, contact night-coverage [email or web address]  CC: Primary care physician; Perrin Maltese, MD

## 2020-10-08 NOTE — ED Provider Notes (Signed)
[facility]EMER[facility]ENCY DEPARTMENT  ____________________________________________  Time seen: Approximately 5:19 PM  I have reviewed the triage vital signs and the nursing notes.   HISTORY  Chief Complaint Weakness   Historian Patient     HPI Patricia Walker is a 75 y.o. female with a history of atrial fibrillation, hypertension, hyperlipidemia and coronary artery disease, presents to the emergency department with a persistent sensation of weakness.  Patient was seen and evaluated on 6/27 and had an extensive work-up for similar complaints and was advised to return to the emergency department for reevaluation if symptoms persisted or worsen.  Patient denies fever at home but has had exertional shortness of breath and some episodic left-sided chest pain.  Patient denies current chest pain.  She states that her cough seems to be worsening and is keeping her up at night.  She states that she has been taking a cough suppressant at night but has attempted no other alleviating measures.   Past Medical History:  Diagnosis Date   Anxiety    Arthritis    Asthma    Coronary artery disease    mild, nonobstructive   Depression    Dyspnea    on exertion   Dysrhythmia    Atrial Fibrillation   GERD (gastroesophageal reflux disease)    Heart murmur    Hyperlipidemia    Hypertension    Hypothyroidism    MI, old 2016   nonobstructive CAD by Cath   Morbid obesity ([place])    Persistent atrial fibrillation (HCC)    Pneumonia 06/2018   and RSV   Psoriasis    Sleep apnea    compliant with CPAP   Thyroid disease      Immunizations up to date:  Yes.     Past Medical History:  Diagnosis Date   Anxiety    Arthritis    Asthma    Coronary artery disease    mild, nonobstructive   Depression    Dyspnea    on exertion   Dysrhythmia    Atrial Fibrillation   GERD (gastroesophageal reflux disease)    Heart murmur    Hyperlipidemia    Hypertension    Hypothyroidism    MI, old 2016    nonobstructive CAD by Cath   Morbid obesity ([place])    Persistent atrial fibrillation ([place])    Pneumonia 06/2018   and RSV   Psoriasis    Sleep apnea    compliant with CPAP   Thyroid disease     Patient Active Problem List   Diagnosis Date Noted   COPD exacerbation ([place]) 10/08/2020   Pneumonia 06/15/2018   Multifocal pneumonia 06/14/2018   Unstable angina ([place]) 06/13/2017   New onset of headaches 06/12/2017   Photophobia of right eye 06/12/2017   Osteoarthritis of knee 11/17/2016   Coronary artery disease 09/16/2016   History of cardioversion 09/16/2016   Hyperlipidemia, unspecified 09/16/2016   Hypertension 09/16/2016   Obesity, unspecified 09/16/2016   Atrial fibrillation ([place]) 09/08/2016   Cardiac arrhythmia 09/09/2015   Gastroesophageal reflux disease 09/09/2015   Obstructive sleep apnea syndrome 09/09/2015   Psoriasis 09/09/2015    Past Surgical History:  Procedure Laterality Date   ARTERY BIOPSY Right 07/21/2017   Procedure: BIOPSY TEMPORAL ARTERY;  Surgeon: [name], MD;  Location: [facility];  Service: Vascular;  Laterality: Right;   CARDIAC CATHETERIZATION     CARDIOVERSION Right 09/01/2016   Procedure: Cardioversion;  Surgeon: [name], MD;  Location: [facility];  Service: Cardiovascular;  Laterality: Right;   CARDIOVERSION N/A 09/09/2016   Procedure: Cardioversion;  Surgeon: [name], MD;  Location: [facility];  Service: Cardiovascular;  Laterality: N/A;   CATARACT EXTRACTION W/PHACO Right 03/26/2019   Procedure: CATARACT EXTRACTION PHACO AND INTRAOCULAR LENS PLACEMENT (IOC) RIGHT 6.45, 00:39.9;  Surgeon: Birder Robson, MD;  Location: [place];  Service: Ophthalmology;  Laterality: Right;   CATARACT EXTRACTION W/PHACO Left 04/16/2019   Procedure: CATARACT EXTRACTION PHACO AND INTRAOCULAR LENS PLACEMENT (IOC) LEFT;   3.14, 00:24.9;  Surgeon: Birder Robson, MD;  Location: [place];  Service: Ophthalmology;  Laterality: Left;   sleep apnea-CPAP   COLONOSCOPY WITH PROPOFOL N/A 08/28/2019   Procedure: COLONOSCOPY WITH PROPOFOL;  Surgeon: Toledo, Benay Pike, MD;  Location: [facility];  Service: Gastroenterology;  Laterality: N/A;   ELECTROPHYSIOLOGIC STUDY N/A 02/01/2016   Procedure: CARDIOVERSION;  Surgeon: [name], MD;  Location: [facility];  Service: Cardiovascular;  Laterality: N/A;   ESOPHAGEAL DILATION     ESOPHAGOGASTRODUODENOSCOPY (EGD) WITH PROPOFOL N/A 02/06/2017   Procedure: ESOPHAGOGASTRODUODENOSCOPY (EGD) WITH PROPOFOL;  Surgeon: [name], MD;  Location: [facility];  Service: Gastroenterology;  Laterality: N/A;   ESOPHAGOGASTRODUODENOSCOPY (EGD) WITH PROPOFOL N/A 08/28/2019   Procedure: ESOPHAGOGASTRODUODENOSCOPY (EGD) WITH PROPOFOL;  Surgeon: Toledo, Benay Pike, MD;  Location: [facility];  Service: Gastroenterology;  Laterality: N/A;   EUS N/A 02/16/2017   Procedure: FULL UPPER ENDOSCOPIC ULTRASOUND (EUS) RADIAL;  Surgeon: [name], MD;  Location: [facility];  Service: Gastroenterology;  Laterality: N/A;   LEFT HEART CATH AND CORONARY ANGIOGRAPHY N/A 09/08/2016   Procedure: Left Heart Cath and Coronary Angiography;  Surgeon: [name], MD;  Location: [place] [facility];  Service: Cardiovascular;  Laterality: N/A;   US ECHOCARDIOGRAPHY      Prior to Admission medications   Medication Sig Start Date End Date Taking? Authorizing Provider  albuterol (VENTOLIN HFA) 108 (90 Base) MCG/ACT inhaler Inhale 2 puffs into the lungs every 6 (six) hours as needed for wheezing or shortness of breath. 09/25/20  Yes Cuthriell, Charline Bills, PA-C  azelastine (ASTELIN) 0.1 % nasal spray Place 2 sprays into both nostrils 2 (two) times daily as needed for rhinitis or allergies.  10/13/16  Yes [provider]  butalbital-acetaminophen-caffeine (FIORICET) 50-325-40 MG tablet Take 1 tablet by mouth every 6 (six) hours as needed for headache. 09/25/20 09/25/21 Yes Cuthriell, Charline Bills, PA-C   folic acid (FOLVITE) 1 MG tablet Take 1 mg by mouth daily.   Yes [provider]  furosemide (LASIX) 20 MG tablet Take 20 mg by mouth daily as needed for fluid. 08/24/16  Yes [provider]  levothyroxine (SYNTHROID, LEVOTHROID) 50 MCG tablet Take 50 mcg by mouth daily before breakfast.   Yes [provider]  ondansetron (ZOFRAN-ODT) 4 MG disintegrating tablet Take 1 tablet (4 mg total) by mouth every 8 (eight) hours as needed for nausea or vomiting. 09/25/20  Yes Cuthriell, Charline Bills, PA-C  PARoxetine (PAXIL) 20 MG tablet Take 20 mg by mouth daily.   Yes [provider]  predniSONE (DELTASONE) 50 MG tablet Take 1 tablet (50 mg total) by mouth daily with breakfast. 09/25/20  Yes Cuthriell, Charline Bills, PA-C  pregabalin (LYRICA) 50 MG capsule Take 50 mg by mouth daily as needed for pain.   Yes [provider]  promethazine-phenylephrine (PROMETHAZINE VC PLAIN) 6.25-5 MG/5ML SYRP Take 5 mLs by mouth every 4 (four) hours as needed for up to 5 days for congestion. 10/05/20 10/10/20 Yes Marjean Donna  E, MD  ranitidine (ZANTAC) 150 MG tablet Take 150 mg by mouth 2 (two) times daily. 04/27/17  Yes [provider]  SKYRIZI, 150 MG DOSE, 75 MG/0.83ML PSKT Inject 1 Dose into the skin every 3 (three) months. 05/09/18  Yes [provider]  traMADol (ULTRAM) 50 MG tablet Take 2 tablets (100 mg total) by mouth every 6 (six) hours as needed for moderate pain or severe pain. 08/23/19  Yes Hinda Kehr, MD  triamcinolone ointment (KENALOG) 0.1 % Apply 1 application topically as directed. 05/09/18  Yes [provider]  apixaban (ELIQUIS) 5 MG TABS tablet Take 5 mg by mouth 2 (two) times daily.    [provider]  atorvastatin (LIPITOR) 20 MG tablet Take 20 mg by mouth every evening. 08/25/16   [provider]  baclofen (LIORESAL) 10 MG tablet Take 10 mg by mouth 2 (two) times daily. Patient not taking: No sig reported 06/13/17   [provider]  benzonatate (TESSALON PERLES) 100 MG capsule Take 1 capsule (100 mg total) by mouth every 6 (six) hours as needed for cough. 09/25/20 09/25/21  Cuthriell, Charline Bills, PA-C  budesonide-formoterol (SYMBICORT) 160-4.5 MCG/ACT inhaler Inhale 2 puffs into the lungs 2 (two) times daily.    [provider]  doxepin (SINEQUAN) 25 MG capsule Take 25 mg by mouth at bedtime. Patient not taking: No sig reported    [provider]  esomeprazole (NEXIUM) 40 MG capsule Take 40 mg by mouth daily. Patient not taking: No sig reported 07/01/17   [provider]  gabapentin (NEURONTIN) 300 MG capsule Take 300 mg by mouth 2 (two) times daily. Patient not taking: No sig reported    [provider]  levofloxacin (LEVAQUIN) 500 MG tablet Take 1 tablet (500 mg total) by mouth daily. Patient not taking: No sig reported 06/18/18   Hillary Bow, MD  methotrexate (RHEUMATREX) 2.5 MG tablet Take 10 mg by mouth once a week. Caution:Chemotherapy. Protect from light. Patient not taking: No sig reported    [provider]  montelukast (SINGULAIR) 10 MG tablet Take 10 mg by mouth at bedtime.  Patient not taking: No sig reported 05/26/17   [provider]    Allergies Codeine and Other  Family History  Problem Relation Age of Onset   Breast cancer Paternal Aunt    Other Father    Heart attack Father     Social History Social History   Tobacco Use   Smoking status: Former    Pack years: 0.00    Types: Cigarettes    Quit date: 02/01/2013    Years since quitting: 7.6   Smokeless tobacco: Never  Vaping Use   Vaping Use: Never used  Substance Use Topics   Alcohol use: No   Drug use: No     Review of Systems  Constitutional: No fever/chills Eyes:  No discharge ENT: No upper respiratory complaints. Respiratory: Patient has cough and shortness of breath.  Gastrointestinal:   No nausea, no vomiting.  No diarrhea.  No  constipation. Musculoskeletal: Negative for musculoskeletal pain. Skin: Negative for rash, abrasions, lacerations, ecchymosis.    ____________________________________________   PHYSICAL EXAM:  VITAL SIGNS: ED Triage Vitals  Enc Vitals Group     BP 10/08/20 1549 108/71     Pulse Rate 10/08/20 1549 77     Resp 10/08/20 1549 19     Temp 10/08/20 1549 98.5 F (36.9 C)     Temp Source 10/08/20 1549 Oral  SpO2 10/08/20 1549 98 %     Weight 10/08/20 1550 290 lb (131.5 kg)     Height 10/08/20 1550 5\' 4"  (1.626 m)     Head Circumference --      Peak Flow --      Pain Score 10/08/20 1550 0     Pain Loc --      Pain Edu? --      Excl. in [place]? --      Constitutional: Alert and oriented. Well appearing and in no acute distress. Eyes: Conjunctivae are normal. PERRL. EOMI. Head: Atraumatic. ENT:      Nose: No congestion/rhinnorhea.      Mouth/Throat: Mucous membranes are moist.  Neck: No stridor.  No cervical spine tenderness to palpation. Cardiovascular: Normal rate, regular rhythm. Normal S1 and S2.  Good peripheral circulation. Respiratory: Normal respiratory effort without tachypnea or retractions.  Patient has expiratory wheezing bilaterally.  Decreased breath sounds in the bases bilaterally. Gastrointestinal: Bowel sounds x 4 quadrants. Soft and nontender to palpation. No guarding or rigidity. No distention. Musculoskeletal: Full range of motion to all extremities. No obvious deformities noted Neurologic:  Normal for age. No gross focal neurologic deficits are appreciated.  Skin:  Skin is warm, dry and intact. No rash noted. Psychiatric: Mood and affect are normal for age. Speech and behavior are normal.   ____________________________________________   LABS (all labs ordered are listed, but only abnormal results are displayed)  Labs Reviewed  RESP PANEL BY RT-PCR (FLU A&B, COVID) ARPGX2 - Abnormal; Notable for the following components:      Result Value   SARS  Coronavirus 2 by RT PCR POSITIVE (*)    All other components within normal limits  BASIC METABOLIC PANEL - Abnormal; Notable for the following components:   Glucose, Bld 183 (*)    BUN 24 (*)    All other components within normal limits  CBC - Abnormal; Notable for the following components:   RBC 5.30 (*)    All other components within normal limits  URINALYSIS, COMPLETE (UACMP) WITH MICROSCOPIC - Abnormal; Notable for the following components:   Color, Urine YELLOW (*)    APPearance HAZY (*)    All other components within normal limits  BRAIN NATRIURETIC PEPTIDE - Abnormal; Notable for the following components:   B Natriuretic Peptide 187.4 (*)    All other components within normal limits  TROPONIN I (HIGH SENSITIVITY) - Abnormal; Notable for the following components:   Troponin I (High Sensitivity) 18 (*)    All other components within normal limits  CULTURE, BLOOD (ROUTINE X 2)  CULTURE, BLOOD (ROUTINE X 2)  EXPECTORATED SPUTUM ASSESSMENT W GRAM STAIN, RFLX TO RESP C  BASIC METABOLIC PANEL  CBC  TROPONIN I (HIGH SENSITIVITY)   ____________________________________________  EKG   ____________________________________________  RADIOLOGY Unk Pinto, personally viewed and evaluated these images (plain radiographs) as part of my medical decision making, as well as reviewing the written report by the radiologist.    DG Chest 2 View  Result Date: 10/08/2020 CLINICAL DATA:  Cough and shortness of breath. EXAM: CHEST - 2 VIEW COMPARISON:  October 05, 2020 FINDINGS: The heart size and mediastinal contours are within normal limits. Mild calcification of the aortic arch is seen. Both lungs are clear. The visualized skeletal structures are unremarkable. IMPRESSION: No active cardiopulmonary disease. Electronically Signed   By: Virgina Norfolk M.D.   On: 10/08/2020 18:41    ____________________________________________    PROCEDURES  Procedure(s) performed:  Procedures     Medications  azithromycin (ZITHROMAX) 500 mg in sodium chloride 0.9 % 250 mL IVPB (0 mg Intravenous Stopped 10/08/20 2120)  butalbital-acetaminophen-caffeine (FIORICET) 50-325-40 MG per tablet 1 tablet (has no administration in time range)  traMADol (ULTRAM) tablet 100 mg (has no administration in time range)  atorvastatin (LIPITOR) tablet 20 mg (has no administration in time range)  furosemide (LASIX) tablet 20 mg (has no administration in time range)  PARoxetine (PAXIL) tablet 20 mg (has no administration in time range)  levothyroxine (SYNTHROID) tablet 50 mcg (has no administration in time range)  apixaban (ELIQUIS) tablet 5 mg (has no administration in time range)  folic acid (FOLVITE) tablet 1 mg (has no administration in time range)  pregabalin (LYRICA) capsule 50 mg (has no administration in time range)  azelastine (ASTELIN) 0.1 % nasal spray 2 spray (has no administration in time range)  benzonatate (TESSALON) capsule 100 mg (has no administration in time range)  montelukast (SINGULAIR) tablet 10 mg (has no administration in time range)  triamcinolone ointment (KENALOG) 0.1 % 1 application (has no administration in time range)  0.9 %  sodium chloride infusion (has no administration in time range)  acetaminophen (TYLENOL) tablet 650 mg (has no administration in time range)    Or  acetaminophen (TYLENOL) suppository 650 mg (has no administration in time range)  traZODone (DESYREL) tablet 25 mg (has no administration in time range)  magnesium hydroxide (MILK OF MAGNESIA) suspension 30 mL (has no administration in time range)  ondansetron (ZOFRAN) tablet 4 mg (has no administration in time range)    Or  ondansetron (ZOFRAN) injection 4 mg (has no administration in time range)  cefTRIAXone (ROCEPHIN) 2 g in sodium chloride 0.9 % 100 mL IVPB (has no administration in time range)  methylPREDNISolone sodium succinate (SOLU-MEDROL) 40 mg/mL injection 40 mg (has no  administration in time range)    Followed by  predniSONE (DELTASONE) tablet 40 mg (has no administration in time range)  ipratropium-albuterol (DUONEB) 0.5-2.5 (3) MG/3ML nebulizer solution 3 mL (has no administration in time range)  guaiFENesin (MUCINEX) 12 hr tablet 600 mg (has no administration in time range)  ipratropium-albuterol (DUONEB) 0.5-2.5 (3) MG/3ML nebulizer solution 3 mL (has no administration in time range)  sodium chloride 0.9 % bolus 500 mL (0 mLs Intravenous Stopped 10/08/20 1900)  ipratropium-albuterol (DUONEB) 0.5-2.5 (3) MG/3ML nebulizer solution 3 mL (3 mLs Nebulization Given 10/08/20 1736)  ipratropium-albuterol (DUONEB) 0.5-2.5 (3) MG/3ML nebulizer solution 3 mL (3 mLs Nebulization Given 10/08/20 1901)  ipratropium-albuterol (DUONEB) 0.5-2.5 (3) MG/3ML nebulizer solution 3 mL (3 mLs Nebulization Given 10/08/20 1902)  methylPREDNISolone sodium succinate (SOLU-MEDROL) 125 mg/2 mL injection 125 mg (125 mg Intravenous Given 10/08/20 1918)  cefTRIAXone (ROCEPHIN) 2 g in sodium chloride 0.9 % 100 mL IVPB (0 g Intravenous Stopped 10/08/20 1952)     ____________________________________________   INITIAL IMPRESSION / ASSESSMENT AND PLAN / ED COURSE  Pertinent labs & imaging results that were available during my care of the patient were reviewed by me and considered in my medical decision making (see chart for details).      Assessment and plan Weakness:  75 year old female presents to the emergency department with a sensation of persistent fatigue and weakness at home.  Vital signs are reassuring at triage.  On physical exam, patient seemed breathless with increased work of breathing.  She was able to ambulate easily to the emergency department to the restroom.  She had expiratory wheezing on exam bilaterally.  Differential diagnosis  includes bronchitis, post viral pneumonia, deconditioning secondary to COVID-19 infection...  Will obtain troponin, BNP, administer DuoNeb and  give 500 cc of supplemental normal saline and will reassess.  Patient's chest x-ray showed no edema, consolidations or opacities concerning for pneumonia.  Patient's troponin improved from emergency department encounter on 6/27.  BNP also improved.  After the patient's initial DuoNeb, patient's wheezing seems significantly worse.  Patient was given 2 more duo nebs with IV Solu-Medrol with no significant improvement.  Patient admitted to hospitalist service with IV Rocephin and azithromycin given in the emergency department for likely COPD exacerbation.  Patient admitted under the care of of hospitalist Dr. Sidney Ace.         ____________________________________________  FINAL CLINICAL IMPRESSION(S) / ED DIAGNOSES  Final diagnoses:  COPD exacerbation ([place])      NEW MEDICATIONS STARTED DURING THIS VISIT:  ED Discharge Orders     None           This chart was dictated using voice recognition software/Dragon. Despite best efforts to proofread, errors can occur which can change the meaning. Any change was purely unintentional.     Karren Cobble 10/08/20 2157    Duffy Bruce, MD 10/11/20 1540

## 2020-10-09 DIAGNOSIS — J441 Chronic obstructive pulmonary disease with (acute) exacerbation: Secondary | ICD-10-CM | POA: Diagnosis not present

## 2020-10-09 LAB — CBC
HCT: 41.8 % (ref 36.0–46.0)
Hemoglobin: 14.2 g/dL (ref 12.0–15.0)
MCH: 27.6 pg (ref 26.0–34.0)
MCHC: 34 g/dL (ref 30.0–36.0)
MCV: 81.2 fL (ref 80.0–100.0)
Platelets: 266 10*3/uL (ref 150–400)
RBC: 5.15 MIL/uL — ABNORMAL HIGH (ref 3.87–5.11)
RDW: 13.7 % (ref 11.5–15.5)
WBC: 7.9 10*3/uL (ref 4.0–10.5)
nRBC: 0 % (ref 0.0–0.2)

## 2020-10-09 LAB — BASIC METABOLIC PANEL
Anion gap: 11 (ref 5–15)
BUN: 20 mg/dL (ref 8–23)
CO2: 21 mmol/L — ABNORMAL LOW (ref 22–32)
Calcium: 8.7 mg/dL — ABNORMAL LOW (ref 8.9–10.3)
Chloride: 103 mmol/L (ref 98–111)
Creatinine, Ser: 0.75 mg/dL (ref 0.44–1.00)
GFR, Estimated: >60 mL/min (ref >60)
Glucose, Bld: 224 mg/dL — ABNORMAL HIGH (ref 70–99)
Potassium: 4.5 mmol/L (ref 3.5–5.1)
Sodium: 135 mmol/L (ref 135–145)

## 2020-10-09 MED ORDER — BUDESONIDE 0.25 MG/2ML IN SUSP
0.2500 mg | Freq: Two times a day (BID) | RESPIRATORY_TRACT | Status: DC
  Filled 2020-10-09: qty 2

## 2020-10-09 MED ORDER — PANTOPRAZOLE SODIUM 40 MG PO TBEC
40.0000 mg | DELAYED_RELEASE_TABLET | Freq: Every day | ORAL | Status: DC
  Administered 2020-10-09 – 2020-10-14 (×6): 40 mg via ORAL
  Filled 2020-10-09 (×6): qty 1

## 2020-10-09 MED ORDER — ARFORMOTEROL TARTRATE 15 MCG/2ML IN NEBU
15.0000 ug | INHALATION_SOLUTION | Freq: Two times a day (BID) | RESPIRATORY_TRACT | Status: DC
  Filled 2020-10-09 (×4): qty 2

## 2020-10-09 MED ORDER — BENZONATATE 100 MG PO CAPS
200.0000 mg | ORAL_CAPSULE | Freq: Three times a day (TID) | ORAL | Status: DC
  Administered 2020-10-09 – 2020-10-14 (×17): 200 mg via ORAL
  Filled 2020-10-09 (×17): qty 2

## 2020-10-09 MED ORDER — GUAIFENESIN-DM 100-10 MG/5ML PO SYRP
5.0000 mL | ORAL_SOLUTION | ORAL | Status: DC | PRN
  Administered 2020-10-10: 5 mL via ORAL
  Filled 2020-10-09: qty 5

## 2020-10-09 NOTE — ED Notes (Signed)
Ambulatory to restroom without assistance with personal cane. Continues to complain of headache

## 2020-10-09 NOTE — Plan of Care (Signed)

## 2020-10-09 NOTE — ED Notes (Signed)
Pt helped to bathroom to void

## 2020-10-09 NOTE — Progress Notes (Signed)
PROGRESS NOTE    Patricia Walker  RUE:[id number] DOB: 05/29/1945 DOA: 10/08/2020 PCP: Perrin Maltese, MD    Brief Narrative:  75 y.o. Caucasian female with medical history significant for asthma, coronary artery disease, depression, hypertension, dyslipidemia, hypothyroidism and obstructive sleep apnea on CPAP, who presented to the ER with acute onset of worsening generalized weakness with associated dyspnea, cough productive of greenish sputum and wheezing.  She was seen twice in the ER for similar symptoms without improvement.   She admits to dyspnea on exertion.  She has been up at night due to her excessive cough despite cough suppressant.  She is unable to take care of her grandchildren.  No fever or chills.  She admits to nausea without vomiting or abdominal pain.  She admits to dysuria, without oliguria, urinary frequency or hematuria or flank pain.  No headache or dizziness or blurred vision.  Patient had a positive COVID with 19 infection about 3 weeks ago.  She was admitted with a working diagnosis of decompensated COPD and started on a regimen of nebulizers and steroids with good result.  The patient is on room air as of 7/1 she still has significantly coarse breath sounds with increased work of breathing.   Assessment & Plan:   Active Problems:   COPD exacerbation (HCC)  Acute exacerbation of COPD Likely secondary to acute bronchitis and in setting of recent COVID infection Patient was COVID-positive 3 weeks ago Started on regimen of steroids and nebulizers with good result Plan: Continue IV Solu-Medrol 40 mg every 6 hours x1 day Scheduled DuoNebs every 4 hours Schedule Brovana twice daily Scheduled Pulmicort twice daily Empiric Rocephin and azithromycin Mucolytic's and antitussives Continue home Singulair Supplemental vitamins Supplemental oxygen as necessary Monitor vitals and fever curve  Hyperlipidemia PTA statin  Hypothyroidism PTA Synthroid  Obstructive sleep  apnea Nightly CPAP  Atrial fibrillation with controlled ventricular response Does not appear to be on any rate control agents Currently rate controlled at time of my evaluation Continue home Eliquis for VTE prophylaxis  Chronic pain Ultram and Lyrica  Depression PTA Paxil   DVT prophylaxis: Eliquis Code Status: Full Family Communication: Granddaughter Summer [phone] on 7/1 Disposition Plan: Status is: Inpatient  Remains inpatient appropriate because:Inpatient level of care appropriate due to severity of illness  Dispo: The patient is from: Home              Anticipated d/c is to: Home              Patient currently is not medically stable to d/c.   Difficult to place patient No  Acute decompensated COPD.  Increased work of breathing.  Patient requires inpatient management for intravenous steroids, IV antibiotics, nebulizers and frequent respiratory care.     Level of care: Med-Surg  Consultants:  None  Procedures:  None  Antimicrobials:  Rocephin Azithromycin   Subjective: Seen and examined.  Still with increased work of breathing.  Reports subjective improvement since admission.  No pain complaints.  Objective: Vitals:   10/09/20 0000 10/09/20 0607 10/09/20 0800 10/09/20 1200  BP: (!) 160/113 (!) 147/86 (!) 146/78 118/71  Pulse: 70 78 76 88  Resp: 16 18 (!) 22 (!) 24  Temp: 97.6 F (36.4 C) 97.9 F (36.6 C) 98 F (36.7 C)   TempSrc: Oral Oral Oral   SpO2: 100% 99% 100% 96%  Weight:      Height:       No intake or output data in the 24 hours  ending 10/09/20 1355 Filed Weights   10/08/20 1550  Weight: 131.5 kg    Examination:  General exam: No acute distress.  Appears frail Respiratory system: Scattered crackles bilaterally.  End expiratory wheeze.  Increased work of breathing.  Room air Cardiovascular system: Regular rate, irregular rhythm, no appreciable murmurs, trace pedal edema Gastrointestinal system: Soft, nontender, nondistended,  normal bowel sounds Central nervous system: Alert and oriented. No focal neurological deficits. Extremities: Symmetric 5 x 5 power. Skin: No rashes, lesions or ulcers Psychiatry: Judgement and insight appear normal. Mood & affect appropriate.     Data Reviewed: I have personally reviewed following labs and imaging studies  CBC: Recent Labs  Lab 10/05/20 0920 10/08/20 1551 10/09/20 0701  WBC 6.8 8.8 7.9  NEUTROABS 5.0  --   --   HGB 15.2* 14.9 14.2  HCT 46.9* 44.6 41.8  MCV 83.9 84.2 81.2  PLT 335 285 789   Basic Metabolic Panel: Recent Labs  Lab 10/05/20 0920 10/08/20 1551 10/09/20 0701  NA 138 137 135  K 4.1 4.4 4.5  CL 102 105 103  CO2 28 24 21*  GLUCOSE 160* 183* 224*  BUN 19 24* 20  CREATININE 0.99 0.83 0.75  CALCIUM 8.6* 8.9 8.7*   GFR: Estimated Creatinine Clearance: 83.2 mL/min (by C-G formula based on SCr of 0.75 mg/dL). Liver Function Tests: Recent Labs  Lab 10/05/20 0920  AST 29  ALT 31  ALKPHOS 78  BILITOT 0.8  PROT 6.8  ALBUMIN 3.4*   No results for input(s): LIPASE, AMYLASE in the last 168 hours. No results for input(s): AMMONIA in the last 168 hours. Coagulation Profile: No results for input(s): INR, PROTIME in the last 168 hours. Cardiac Enzymes: No results for input(s): CKTOTAL, CKMB, CKMBINDEX, TROPONINI in the last 168 hours. BNP (last 3 results) No results for input(s): PROBNP in the last 8760 hours. HbA1C: No results for input(s): HGBA1C in the last 72 hours. CBG: No results for input(s): GLUCAP in the last 168 hours. Lipid Profile: No results for input(s): CHOL, HDL, LDLCALC, TRIG, CHOLHDL, LDLDIRECT in the last 72 hours. Thyroid Function Tests: No results for input(s): TSH, T4TOTAL, FREET4, T3FREE, THYROIDAB in the last 72 hours. Anemia Panel: No results for input(s): VITAMINB12, FOLATE, FERRITIN, TIBC, IRON, RETICCTPCT in the last 72 hours. Sepsis Labs: Recent Labs  Lab 10/05/20 0920 10/05/20 1138  LATICACIDVEN 2.4* 1.7     Recent Results (from the past 240 hour(s))  Resp Panel by RT-PCR (Flu A&B, Covid) Nasopharyngeal Swab     Status: Abnormal   Collection Time: 10/08/20  7:55 PM   Specimen: Nasopharyngeal Swab; Nasopharyngeal(NP) swabs in vial transport medium  Result Value Ref Range Status   SARS Coronavirus 2 by RT PCR POSITIVE (A) NEGATIVE Final    Comment: RESULT CALLED TO, READ BACK BY AND VERIFIED WITH: ROBIN REGISTER @2103  ON 10/08/20 SKL (NOTE) SARS-CoV-2 target nucleic acids are DETECTED.  The SARS-CoV-2 RNA is generally detectable in upper respiratory specimens during the acute phase of infection. Positive results are indicative of the presence of the identified virus, but do not rule out bacterial infection or co-infection with other pathogens not detected by the test. Clinical correlation with patient history and other diagnostic information is necessary to determine patient infection status. The expected result is Negative.  Fact Sheet for Patients: EntrepreneurPulse.com.au  Fact Sheet for Healthcare Providers: IncredibleEmployment.be  This test is not yet approved or cleared by the [place] FDA and  has been authorized for detection and/or diagnosis  of SARS-CoV-2 by FDA under an Emergency Use Authorization (EUA).  This EUA will remain in effect (meaning this test can  be used) for the duration of  the COVID-19 declaration under Section 564(b)(1) of the Act, 21 U.S.C. section 360bbb-3(b)(1), unless the authorization is terminated or revoked sooner.     Influenza A by PCR NEGATIVE NEGATIVE Final   Influenza B by PCR NEGATIVE NEGATIVE Final    Comment: (NOTE) The Xpert Xpress SARS-CoV-2/FLU/RSV plus assay is intended as an aid in the diagnosis of influenza from Nasopharyngeal swab specimens and should not be used as a sole basis for treatment. Nasal washings and aspirates are unacceptable for Xpert Xpress  SARS-CoV-2/FLU/RSV testing.  Fact Sheet for Patients: EntrepreneurPulse.com.au  Fact Sheet for Healthcare Providers: IncredibleEmployment.be  This test is not yet approved or cleared by the [place] FDA and has been authorized for detection and/or diagnosis of SARS-CoV-2 by FDA under an Emergency Use Authorization (EUA). This EUA will remain in effect (meaning this test can be used) for the duration of the COVID-19 declaration under Section 564(b)(1) of the Act, 21 U.S.C. section 360bbb-3(b)(1), unless the authorization is terminated or revoked.  Performed at [facility], [address] [place], [place] [address]   Culture, blood (Routine X 2) w Reflex to ID Panel     Status: None (Preliminary result)   Collection Time: 10/08/20  8:30 PM   Specimen: BLOOD  Result Value Ref Range Status   Specimen Description BLOOD BLOOD LEFT HAND  Final   Special Requests   Final    BOTTLES DRAWN AEROBIC AND ANAEROBIC Blood Culture adequate volume   Culture   Final    NO GROWTH < 12 HOURS Performed at [facility], [address] [place], [place] [address]    Report Status PENDING  Incomplete  Culture, blood (Routine X 2) w Reflex to ID Panel     Status: None (Preliminary result)   Collection Time: 10/08/20  8:43 PM   Specimen: BLOOD  Result Value Ref Range Status   Specimen Description BLOOD BLOOD RIGHT HAND  Final   Special Requests   Final    BOTTLES DRAWN AEROBIC AND ANAEROBIC Blood Culture adequate volume   Culture   Final    NO GROWTH < 12 HOURS Performed at [facility], [address], [place], [place] [address]    Report Status PENDING  Incomplete         Radiology Studies: DG Chest 2 View  Result Date: 10/08/2020 CLINICAL DATA:  Cough and shortness of breath. EXAM: CHEST - 2 VIEW COMPARISON:  October 05, 2020 FINDINGS: The heart size and mediastinal contours are within normal limits. Mild calcification  of the aortic arch is seen. Both lungs are clear. The visualized skeletal structures are unremarkable. IMPRESSION: No active cardiopulmonary disease. Electronically Signed   By: Virgina Norfolk M.D.   On: 10/08/2020 18:41        Scheduled Meds:  apixaban  5 mg Oral BID   arformoterol  15 mcg Nebulization BID   vitamin C  500 mg Oral Daily   atorvastatin  20 mg Oral QPM   benzonatate  200 mg Oral TID   budesonide (PULMICORT) nebulizer solution  0.25 mg Nebulization BID   cholecalciferol  1,000 Units Oral Daily   folic acid  1 mg Oral Daily   guaiFENesin  600 mg Oral BID   ipratropium-albuterol  3 mL Nebulization QID   levothyroxine  50 mcg Oral QAC breakfast  methylPREDNISolone (SOLU-MEDROL) injection  40 mg Intravenous Q6H   Followed by   Derrill Memo ON 10/10/2020] predniSONE  40 mg Oral Q breakfast   montelukast  10 mg Oral QHS   PARoxetine  20 mg Oral Daily   triamcinolone ointment  1 application Topical UD   zinc sulfate  220 mg Oral Daily   Continuous Infusions:  sodium chloride 75 mL/hr at 10/08/20 2226   azithromycin Stopped (10/08/20 2120)   cefTRIAXone (ROCEPHIN)  IV       LOS: 1 day    Time spent: 35 minutes    Sidney Ace, MD [facility] Pager [phone]  If 7PM-7AM, please contact night-coverage 10/09/2020, 1:55 PM

## 2020-10-09 NOTE — Plan of Care (Signed)
  Problem: Education: Goal: Knowledge of General Education information will improve Description: Including pain rating scale, medication(s)/side effects and non-pharmacologic comfort measures 10/09/2020 2357 by Rande Brunt, RN Outcome: Progressing 10/09/2020 2356 by Rande Brunt, RN Outcome: Progressing   Problem: Health Behavior/Discharge Planning: Goal: Ability to manage health-related needs will improve 10/09/2020 2357 by Rande Brunt, RN Outcome: Progressing 10/09/2020 2356 by Rande Brunt, RN Outcome: Progressing   Problem: Clinical Measurements: Goal: Ability to maintain clinical measurements within normal limits will improve 10/09/2020 2357 by Rande Brunt, RN Outcome: Progressing 10/09/2020 2356 by Grace Isaac D, RN Outcome: Progressing Goal: Will remain free from infection 10/09/2020 2357 by Rande Brunt, RN Outcome: Progressing 10/09/2020 2356 by Grace Isaac D, RN Outcome: Progressing Goal: Diagnostic test results will improve 10/09/2020 2357 by Rande Brunt, RN Outcome: Progressing 10/09/2020 2356 by Grace Isaac D, RN Outcome: Progressing Goal: Respiratory complications will improve 10/09/2020 2357 by Grace Isaac D, RN Outcome: Progressing 10/09/2020 2356 by Grace Isaac D, RN Outcome: Progressing Goal: Cardiovascular complication will be avoided 10/09/2020 2357 by Rande Brunt, RN Outcome: Progressing 10/09/2020 2356 by Rande Brunt, RN Outcome: Progressing   Problem: Activity: Goal: Risk for activity intolerance will decrease 10/09/2020 2357 by Rande Brunt, RN Outcome: Progressing 10/09/2020 2356 by Grace Isaac D, RN Outcome: Progressing   Problem: Nutrition: Goal: Adequate nutrition will be maintained 10/09/2020 2357 by Rande Brunt, RN Outcome: Progressing 10/09/2020 2356 by Grace Isaac D, RN Outcome: Progressing   Problem: Coping: Goal: Level of anxiety will decrease 10/09/2020 2357 by Rande Brunt, RN Outcome:  Progressing 10/09/2020 2356 by Grace Isaac D, RN Outcome: Progressing   Problem: Elimination: Goal: Will not experience complications related to bowel motility 10/09/2020 2357 by Rande Brunt, RN Outcome: Progressing 10/09/2020 2356 by Rande Brunt, RN Outcome: Progressing Goal: Will not experience complications related to urinary retention 10/09/2020 2357 by Rande Brunt, RN Outcome: Progressing 10/09/2020 2356 by Rande Brunt, RN Outcome: Progressing   Problem: Pain Managment: Goal: General experience of comfort will improve 10/09/2020 2357 by Rande Brunt, RN Outcome: Progressing 10/09/2020 2356 by Grace Isaac D, RN Outcome: Progressing   Problem: Safety: Goal: Ability to remain free from injury will improve 10/09/2020 2357 by Rande Brunt, RN Outcome: Progressing 10/09/2020 2356 by Grace Isaac D, RN Outcome: Progressing   Problem: Skin Integrity: Goal: Risk for impaired skin integrity will decrease 10/09/2020 2357 by Rande Brunt, RN Outcome: Progressing 10/09/2020 2356 by Grace Isaac D, RN Outcome: Progressing   Problem: Education: Goal: Knowledge of risk factors and measures for prevention of condition will improve 10/09/2020 2357 by Rande Brunt, RN Outcome: Progressing 10/09/2020 2356 by Grace Isaac D, RN Outcome: Progressing   Problem: Coping: Goal: Psychosocial and spiritual needs will be supported 10/09/2020 2357 by Rande Brunt, RN Outcome: Progressing 10/09/2020 2356 by Grace Isaac D, RN Outcome: Progressing   Problem: Respiratory: Goal: Will maintain a patent airway 10/09/2020 2357 by Grace Isaac D, RN Outcome: Progressing 10/09/2020 2356 by Grace Isaac D, RN Outcome: Progressing Goal: Complications related to the disease process, condition or treatment will be avoided or minimized 10/09/2020 2357 by Rande Brunt, RN Outcome: Progressing 10/09/2020 2356 by Rande Brunt, RN Outcome: Progressing

## 2020-10-10 DIAGNOSIS — J441 Chronic obstructive pulmonary disease with (acute) exacerbation: Secondary | ICD-10-CM | POA: Diagnosis not present

## 2020-10-10 MED ORDER — MOMETASONE FURO-FORMOTEROL FUM 100-5 MCG/ACT IN AERO
2.0000 | INHALATION_SPRAY | Freq: Two times a day (BID) | RESPIRATORY_TRACT | Status: DC
  Administered 2020-10-10 – 2020-10-14 (×9): 2 via RESPIRATORY_TRACT
  Filled 2020-10-10: qty 8.8

## 2020-10-10 MED ORDER — IPRATROPIUM-ALBUTEROL 20-100 MCG/ACT IN AERS
1.0000 | INHALATION_SPRAY | Freq: Four times a day (QID) | RESPIRATORY_TRACT | Status: DC
  Administered 2020-10-10 – 2020-10-14 (×16): 1 via RESPIRATORY_TRACT
  Filled 2020-10-10: qty 4

## 2020-10-10 MED ORDER — DICYCLOMINE HCL 20 MG PO TABS
20.0000 mg | ORAL_TABLET | Freq: Three times a day (TID) | ORAL | Status: DC
  Administered 2020-10-10 – 2020-10-14 (×13): 20 mg via ORAL
  Filled 2020-10-10 (×17): qty 1

## 2020-10-10 MED ORDER — AZITHROMYCIN 250 MG PO TABS
500.0000 mg | ORAL_TABLET | Freq: Every day | ORAL | Status: AC
  Administered 2020-10-10 – 2020-10-12 (×3): 500 mg via ORAL
  Filled 2020-10-10 (×3): qty 2

## 2020-10-10 NOTE — Progress Notes (Signed)
Patient has refused cpap during stay. Will have RN to call if changes mind

## 2020-10-10 NOTE — Evaluation (Signed)
Occupational Therapy Evaluation Patient Details Name: Patricia Walker MRN: [id number] DOB: 01/18/1946 Today's Date: 10/10/2020    History of Present Illness 75 y.o. Caucasian female with medical history significant for asthma, coronary artery disease, depression, hypertension, dyslipidemia, hypothyroidism and obstructive sleep apnea on CPAP, who presented to the ER with acute onset of worsening generalized weakness with associated dyspnea, cough productive of greenish sputum and wheezing.  She was seen twice in the ER for similar symptoms without improvement. She admits to dyspnea on exertion.  She has been up at night due to her excessive cough despite cough suppressant.  She is unable to take care of her grandchildren.  No fever or chills.  She admits to nausea without vomiting or abdominal pain.  She admits to dysuria, without oliguria, urinary frequency or hematuria or flank pain.  No headache or dizziness or blurred vision.  Patient had a positive COVID 19 infection about 3 weeks ago. She was admitted with a working diagnosis of decompensated COPD.   Clinical Impression   Pt was seen for OT evaluation this date. Pt reports that prior to COVID-19 diagnosis she was independent, completing all ADL tasks independently and ambulating without an assisted device. She lives with her sister and 2 grandchildren who help out with bringing groceries in sometimes. Since CV diagnosis, pt reports that she has been using a SPC for mobility and required a bit more time and effort to perform ADL and IADL tasks. Pt reports becoming easily fatigued or out of breath with minimal exertion. Currently pt demonstrates impairments in activity tolerance and generalized weakness as described below (See OT problem list) which functionally limit her ability to perform ADL/self-care tasks and mobility at baseline independence. Pt currently requires set up assist for a seated sponge bath but no other assist required for bathing or LB  dressing. Pt educated in energy conservation conservation strategies including pursed lip breathing, activity pacing, AE/DME, prioritizing of meaningful occupations, and falls prevention. Pt verbalized understanding but would benefit from additional skilled OT services to maximize recall and carryover of learned techniques and facilitate implementation of learned techniques into daily routines. Upon discharge, recommend HHOT services to maximize pt safety and return to functional independence during meaningful occupations of daily life.      Follow Up Recommendations  Home health OT    Equipment Recommendations  Other (comment) Management consultant)    Recommendations for Other Services       Precautions / Restrictions Precautions Precautions: None Precaution Comments: low falls risk, ambulating w SPC in room mod indep Restrictions Weight Bearing Restrictions: No      Mobility Bed Mobility Overal bed mobility: Modified Independent                  Transfers Overall transfer level: Modified independent Equipment used: Straight cane             General transfer comment: no overt difficulties, endorses feeling winded with exertion, but negotiates transfers and mobility in room with [facility] without assist    Balance Overall balance assessment: No apparent balance deficits (not formally assessed)                                         ADL either performed or assessed with clinical judgement   ADL Overall ADL's : Modified independent  General ADL Comments: Pt able to perform sponge bath from seated position with set up only. Mod indep with toilet transfers using SPC. Mod indep with LB dressing in room.     Vision Patient Visual Report: No change from baseline Vision Assessment?: No apparent visual deficits     Perception     Praxis      Pertinent Vitals/Pain Pain Assessment: 0-10 Pain Score: 5  Pain  Location: chest from coughing Pain Descriptors / Indicators: Aching Pain Intervention(s): Limited activity within patient's tolerance;Monitored during session;Patient requesting pain meds-RN notified     Hand Dominance Right   Extremity/Trunk Assessment Upper Extremity Assessment Upper Extremity Assessment: Generalized weakness (hx R shoulder pain/issues with crossing midline)   Lower Extremity Assessment Lower Extremity Assessment: Generalized weakness       Communication Communication Communication: No difficulties   Cognition Arousal/Alertness: Awake/alert Behavior During Therapy: WFL for tasks assessed/performed Overall Cognitive Status: Within Functional Limits for tasks assessed                                     General Comments  Pt reports feeling 4/10 SOB, just back from bathroom before OT's arrival    Exercises Other Exercises Other Exercises: Pt educated in energy conservation strategies including rest breaks, AE/DME, falls prevention, work simplification, and task modifications to minimize SOB/over exertion. Pt verbalized understanding   Shoulder Instructions      Home Living Family/patient expects to be discharged to:: Private residence Living Arrangements: Other relatives;Non-relatives/Friends (sister and 2 grandkids, ages 5 and 63) Available Help at Discharge: Family Type of Home: House Home Access: Stairs to enter Technical brewer of Steps: 3 Entrance Stairs-Rails: Right Home Layout: One level     Bathroom Shower/Tub: Teacher, early years/pre: Standard     Home Equipment: Cane - single point;Walker - 2 wheels          Prior Functioning/Environment Level of Independence: Independent with assistive device(s)        Comments: Pt was indep with mobility but since getting sick over past several weeks has been using the cane. Indep with basic ADL. Drives. Indep with cleaning, cooking. Grandsons bring in groceries         OT Problem List: Decreased strength;Decreased activity tolerance      OT Treatment/Interventions: Self-care/ADL training;Therapeutic exercise;Therapeutic activities;Energy conservation;DME and/or AE instruction;Patient/family education    OT Goals(Current goals can be found in the care plan section) Acute Rehab OT Goals Patient Stated Goal: feel better and go home when I'm ready OT Goal Formulation: With patient Time For Goal Achievement: 10/24/20 Potential to Achieve Goals: Good ADL Goals Additional ADL Goal #1: Pt will verbalize plan to implement at least 2 learned energy conservation strategies to maximize safety/indep with ADL/mobility while recovering. Additional ADL Goal #2: Pt will verbalize plan to implement at least 1 learned falls prevention strategy to maximize safety with ADL/mobility and minimize falls risk.  OT Frequency: Min 1X/week   Barriers to D/C:            Co-evaluation              AM-PAC OT "6 Clicks" Daily Activity     Outcome Measure Help from another person eating meals?: None Help from another person taking care of personal grooming?: None Help from another person toileting, which includes using toliet, bedpan, or urinal?: None Help from another person bathing (including washing, rinsing, drying)?:  None Help from another person to put on and taking off regular upper body clothing?: None Help from another person to put on and taking off regular lower body clothing?: None 6 Click Score: 24   End of Session    Activity Tolerance: Patient tolerated treatment well Patient left: in chair;with call bell/phone within reach  OT Visit Diagnosis: Other abnormalities of gait and mobility (R26.89);Muscle weakness (generalized) (M62.81)                Time: 7262-0355 OT Time Calculation (min): 30 min Charges:  OT General Charges $OT Visit: 1 Visit OT Evaluation $OT Eval Low Complexity: 1 Low OT Treatments $Self Care/Home Management : 23-37  mins  Hanley Hays, MPH, MS, OTR/L ascom [phone] 10/10/20, 3:04 PM

## 2020-10-10 NOTE — Progress Notes (Signed)
PROGRESS NOTE    Patricia Walker  VZC:[id number] DOB: Oct 05, 1945 DOA: 10/08/2020 PCP: Perrin Maltese, MD    Brief Narrative:  75 y.o. Caucasian female with medical history significant for asthma, coronary artery disease, depression, hypertension, dyslipidemia, hypothyroidism and obstructive sleep apnea on CPAP, who presented to the ER with acute onset of worsening generalized weakness with associated dyspnea, cough productive of greenish sputum and wheezing.  She was seen twice in the ER for similar symptoms without improvement.   She admits to dyspnea on exertion.  She has been up at night due to her excessive cough despite cough suppressant.  She is unable to take care of her grandchildren.  No fever or chills.  She admits to nausea without vomiting or abdominal pain.  She admits to dysuria, without oliguria, urinary frequency or hematuria or flank pain.  No headache or dizziness or blurred vision.  Patient had a positive COVID with 19 infection about 3 weeks ago.  She was admitted with a working diagnosis of decompensated COPD and started on a regimen of nebulizers and steroids with good result.  The patient is on room air as of 7/1 she still has significantly coarse breath sounds with increased work of breathing.   Assessment & Plan:   Active Problems:   COPD exacerbation (HCC)  Acute exacerbation of COPD Likely secondary to acute bronchitis and in setting of recent COVID infection Patient was COVID-positive 3 weeks ago Started on regimen of steroids and nebulizers with good result Plan: P.o. prednisone 40 mg a day Scheduled DuoNebs every 4 hours Schedule Brovana twice daily Scheduled Pulmicort twice daily Empiric azithromycin Mucolytic's and antitussives Continue home Singulair Supplemental vitamins Supplemental oxygen as necessary Monitor vitals and fever curve Tentative plan discharge home 7/3 Request therapy evaluations Encourage patient to ambulate  Headache Appears to be  related to steroids versus history of COVID infection Patient endorses migraine-like symptoms No history of migraines Plan: As needed Fioricet  Hyperlipidemia PTA statin  Hypothyroidism PTA Synthroid  Obstructive sleep apnea Nightly CPAP  Atrial fibrillation with controlled ventricular response Does not appear to be on any rate control agents Currently rate controlled at time of my evaluation Continue home Eliquis for VTE prophylaxis  Chronic pain Ultram and Lyrica  Depression PTA Paxil   DVT prophylaxis: Eliquis Code Status: Full Family Communication: Granddaughter Summer [phone] on 7/1 Disposition Plan: Status is: Inpatient  Remains inpatient appropriate because:Inpatient level of care appropriate due to severity of illness  Dispo: The patient is from: Home              Anticipated d/c is to: Home              Patient currently is not medically stable to d/c.   Difficult to place patient No  Acute decompensated COPD.  Increased work of breathing with associated wheezing.  Requires inpatient management for frequent nebulizers and respiratory support.     Level of care: Med-Surg  Consultants:  None  Procedures:  None  Antimicrobials:  Rocephin Azithromycin   Subjective: Patient seen and examined.  Endorses headache.  Still with wheezing and increased work of breathing.  Also endorsing cough.  Objective: Vitals:   10/09/20 2012 10/09/20 2308 10/10/20 0533 10/10/20 0952  BP: 123/71 135/78 (!) 154/94 (!) 145/86  Pulse: 89 80 81 (!) 57  Resp: 17  20 18   Temp:  97.7 F (36.5 C) 98 F (36.7 C) 98 F (36.7 C)  TempSrc:      SpO2:  96% 99% 97% 97%  Weight:      Height:        Intake/Output Summary (Last 24 hours) at 10/10/2020 1124 Last data filed at 10/10/2020 0600 Gross per 24 hour  Intake 250 ml  Output 550 ml  Net -300 ml   Filed Weights   10/08/20 1550  Weight: 131.5 kg    Examination:  General exam: No acute distress.  Appears  chronically ill Respiratory system: Bilateral scattered crackles.  Diffuse end expiratory wheeze.  Normal work of breathing.  Room air Cardiovascular system: Regular rate, irregular rhythm, no appreciable murmurs, trace pedal edema Gastrointestinal system: Soft, nontender, nondistended, normal bowel sounds Central nervous system: Alert and oriented. No focal neurological deficits. Extremities: Symmetric 5 x 5 power. Skin: No rashes, lesions or ulcers Psychiatry: Judgement and insight appear normal. Mood & affect appropriate.     Data Reviewed: I have personally reviewed following labs and imaging studies  CBC: Recent Labs  Lab 10/05/20 0920 10/08/20 1551 10/09/20 0701  WBC 6.8 8.8 7.9  NEUTROABS 5.0  --   --   HGB 15.2* 14.9 14.2  HCT 46.9* 44.6 41.8  MCV 83.9 84.2 81.2  PLT 335 285 009   Basic Metabolic Panel: Recent Labs  Lab 10/05/20 0920 10/08/20 1551 10/09/20 0701  NA 138 137 135  K 4.1 4.4 4.5  CL 102 105 103  CO2 28 24 21*  GLUCOSE 160* 183* 224*  BUN 19 24* 20  CREATININE 0.99 0.83 0.75  CALCIUM 8.6* 8.9 8.7*   GFR: Estimated Creatinine Clearance: 83.2 mL/min (by C-G formula based on SCr of 0.75 mg/dL). Liver Function Tests: Recent Labs  Lab 10/05/20 0920  AST 29  ALT 31  ALKPHOS 78  BILITOT 0.8  PROT 6.8  ALBUMIN 3.4*   No results for input(s): LIPASE, AMYLASE in the last 168 hours. No results for input(s): AMMONIA in the last 168 hours. Coagulation Profile: No results for input(s): INR, PROTIME in the last 168 hours. Cardiac Enzymes: No results for input(s): CKTOTAL, CKMB, CKMBINDEX, TROPONINI in the last 168 hours. BNP (last 3 results) No results for input(s): PROBNP in the last 8760 hours. HbA1C: No results for input(s): HGBA1C in the last 72 hours. CBG: No results for input(s): GLUCAP in the last 168 hours. Lipid Profile: No results for input(s): CHOL, HDL, LDLCALC, TRIG, CHOLHDL, LDLDIRECT in the last 72 hours. Thyroid Function  Tests: No results for input(s): TSH, T4TOTAL, FREET4, T3FREE, THYROIDAB in the last 72 hours. Anemia Panel: No results for input(s): VITAMINB12, FOLATE, FERRITIN, TIBC, IRON, RETICCTPCT in the last 72 hours. Sepsis Labs: Recent Labs  Lab 10/05/20 0920 10/05/20 1138  LATICACIDVEN 2.4* 1.7    Recent Results (from the past 240 hour(s))  Resp Panel by RT-PCR (Flu A&B, Covid) Nasopharyngeal Swab     Status: Abnormal   Collection Time: 10/08/20  7:55 PM   Specimen: Nasopharyngeal Swab; Nasopharyngeal(NP) swabs in vial transport medium  Result Value Ref Range Status   SARS Coronavirus 2 by RT PCR POSITIVE (A) NEGATIVE Final    Comment: RESULT CALLED TO, READ BACK BY AND VERIFIED WITH: ROBIN REGISTER @2103  ON 10/08/20 SKL (NOTE) SARS-CoV-2 target nucleic acids are DETECTED.  The SARS-CoV-2 RNA is generally detectable in upper respiratory specimens during the acute phase of infection. Positive results are indicative of the presence of the identified virus, but do not rule out bacterial infection or co-infection with other pathogens not detected by the test. Clinical correlation with patient history and other  diagnostic information is necessary to determine patient infection status. The expected result is Negative.  Fact Sheet for Patients: EntrepreneurPulse.com.au  Fact Sheet for Healthcare Providers: IncredibleEmployment.be  This test is not yet approved or cleared by the [place] FDA and  has been authorized for detection and/or diagnosis of SARS-CoV-2 by FDA under an Emergency Use Authorization (EUA).  This EUA will remain in effect (meaning this test can  be used) for the duration of  the COVID-19 declaration under Section 564(b)(1) of the Act, 21 U.S.C. section 360bbb-3(b)(1), unless the authorization is terminated or revoked sooner.     Influenza A by PCR NEGATIVE NEGATIVE Final   Influenza B by PCR NEGATIVE NEGATIVE Final     Comment: (NOTE) The Xpert Xpress SARS-CoV-2/FLU/RSV plus assay is intended as an aid in the diagnosis of influenza from Nasopharyngeal swab specimens and should not be used as a sole basis for treatment. Nasal washings and aspirates are unacceptable for Xpert Xpress SARS-CoV-2/FLU/RSV testing.  Fact Sheet for Patients: EntrepreneurPulse.com.au  Fact Sheet for Healthcare Providers: IncredibleEmployment.be  This test is not yet approved or cleared by the [place] FDA and has been authorized for detection and/or diagnosis of SARS-CoV-2 by FDA under an Emergency Use Authorization (EUA). This EUA will remain in effect (meaning this test can be used) for the duration of the COVID-19 declaration under Section 564(b)(1) of the Act, 21 U.S.C. section 360bbb-3(b)(1), unless the authorization is terminated or revoked.  Performed at [facility], [address] [place], [place] [address]   Culture, blood (Routine X 2) w Reflex to ID Panel     Status: None (Preliminary result)   Collection Time: 10/08/20  8:30 PM   Specimen: BLOOD  Result Value Ref Range Status   Specimen Description BLOOD BLOOD LEFT HAND  Final   Special Requests   Final    BOTTLES DRAWN AEROBIC AND ANAEROBIC Blood Culture adequate volume   Culture   Final    NO GROWTH 2 DAYS Performed at [facility], [address] [place], [place] [address]    Report Status PENDING  Incomplete  Culture, blood (Routine X 2) w Reflex to ID Panel     Status: None (Preliminary result)   Collection Time: 10/08/20  8:43 PM   Specimen: BLOOD  Result Value Ref Range Status   Specimen Description BLOOD BLOOD RIGHT HAND  Final   Special Requests   Final    BOTTLES DRAWN AEROBIC AND ANAEROBIC Blood Culture adequate volume   Culture   Final    NO GROWTH 2 DAYS Performed at [facility], [address] [place], [place] [address]    Report Status PENDING  Incomplete          Radiology Studies: DG Chest 2 View  Result Date: 10/08/2020 CLINICAL DATA:  Cough and shortness of breath. EXAM: CHEST - 2 VIEW COMPARISON:  October 05, 2020 FINDINGS: The heart size and mediastinal contours are within normal limits. Mild calcification of the aortic arch is seen. Both lungs are clear. The visualized skeletal structures are unremarkable. IMPRESSION: No active cardiopulmonary disease. Electronically Signed   By: Virgina Norfolk M.D.   On: 10/08/2020 18:41        Scheduled Meds:  apixaban  5 mg Oral BID   arformoterol  15 mcg Nebulization BID   vitamin C  500 mg Oral Daily   atorvastatin  20 mg Oral QPM   azithromycin  500 mg Oral Daily   benzonatate  200  mg Oral TID   budesonide (PULMICORT) nebulizer solution  0.25 mg Nebulization BID   cholecalciferol  1,000 Units Oral Daily   dicyclomine  20 mg Oral TID AC   folic acid  1 mg Oral Daily   guaiFENesin  600 mg Oral BID   ipratropium-albuterol  3 mL Nebulization QID   levothyroxine  50 mcg Oral QAC breakfast   montelukast  10 mg Oral QHS   pantoprazole  40 mg Oral Daily   PARoxetine  20 mg Oral Daily   predniSONE  40 mg Oral Q breakfast   triamcinolone ointment  1 application Topical UD   zinc sulfate  220 mg Oral Daily   Continuous Infusions:     LOS: 2 days    Time spent: 25 minutes    Sidney Ace, MD [facility] Pager [phone]  If 7PM-7AM, please contact night-coverage 10/10/2020, 11:24 AM

## 2020-10-10 NOTE — Progress Notes (Signed)
PHARMACIST - PHYSICIAN COMMUNICATION  CONCERNING: Antibiotic IV to Oral Route Change Policy  RECOMMENDATION: This patient is receiving azithromycin by the intravenous route.  Based on criteria approved by the Pharmacy and Therapeutics Committee, the antibiotic(s) is/are being converted to the equivalent oral dose form(s).   DESCRIPTION: These criteria include: Patient being treated for a respiratory tract infection, urinary tract infection, cellulitis or clostridium difficile associated diarrhea if on metronidazole The patient is not neutropenic and does not exhibit a GI malabsorption state The patient is eating (either orally or via tube) and/or has been taking other orally administered medications for a least 24 hours The patient is improving clinically and has a Tmax < 100.5  If you have questions about this conversion, please contact the [place]  10/10/20

## 2020-10-11 DIAGNOSIS — J441 Chronic obstructive pulmonary disease with (acute) exacerbation: Secondary | ICD-10-CM | POA: Diagnosis not present

## 2020-10-11 LAB — BASIC METABOLIC PANEL
Anion gap: 6 (ref 5–15)
BUN: 23 mg/dL (ref 8–23)
CO2: 27 mmol/L (ref 22–32)
Calcium: 8.9 mg/dL (ref 8.9–10.3)
Chloride: 103 mmol/L (ref 98–111)
Creatinine, Ser: 1.04 mg/dL — ABNORMAL HIGH (ref 0.44–1.00)
GFR, Estimated: 56 mL/min — ABNORMAL LOW (ref >60)
Glucose, Bld: 145 mg/dL — ABNORMAL HIGH (ref 70–99)
Potassium: 3.8 mmol/L (ref 3.5–5.1)
Sodium: 136 mmol/L (ref 135–145)

## 2020-10-11 LAB — CBC WITH DIFFERENTIAL/PLATELET
Abs Immature Granulocytes: 0.08 10*3/uL — ABNORMAL HIGH (ref 0.00–0.07)
Basophils Absolute: 0 10*3/uL (ref 0.0–0.1)
Basophils Relative: 0 %
Eosinophils Absolute: 0 10*3/uL (ref 0.0–0.5)
Eosinophils Relative: 0 %
HCT: 41.4 % (ref 36.0–46.0)
Hemoglobin: 13.5 g/dL (ref 12.0–15.0)
Immature Granulocytes: 1 %
Lymphocytes Relative: 14 %
Lymphs Abs: 1.6 10*3/uL (ref 0.7–4.0)
MCH: 26.9 pg (ref 26.0–34.0)
MCHC: 32.6 g/dL (ref 30.0–36.0)
MCV: 82.5 fL (ref 80.0–100.0)
Monocytes Absolute: 0.9 10*3/uL (ref 0.1–1.0)
Monocytes Relative: 8 %
Neutro Abs: 9.1 10*3/uL — ABNORMAL HIGH (ref 1.7–7.7)
Neutrophils Relative %: 77 %
Platelets: 264 10*3/uL (ref 150–400)
RBC: 5.02 MIL/uL (ref 3.87–5.11)
RDW: 14.3 % (ref 11.5–15.5)
WBC: 11.8 10*3/uL — ABNORMAL HIGH (ref 4.0–10.5)
nRBC: 0 % (ref 0.0–0.2)

## 2020-10-11 MED ORDER — METHYLPREDNISOLONE SODIUM SUCC 40 MG IJ SOLR
40.0000 mg | Freq: Three times a day (TID) | INTRAMUSCULAR | Status: DC
  Administered 2020-10-11 – 2020-10-13 (×7): 40 mg via INTRAVENOUS
  Filled 2020-10-11 (×7): qty 1
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
## 2020-10-11 NOTE — Progress Notes (Signed)
PROGRESS NOTE    Patricia Walker  IPJ:[id number] DOB: Nov 25, 1945 DOA: 10/08/2020 PCP: Perrin Maltese, MD    Brief Narrative:  75 y.o. Caucasian female with medical history significant for asthma, coronary artery disease, depression, hypertension, dyslipidemia, hypothyroidism and obstructive sleep apnea on CPAP, who presented to the ER with acute onset of worsening generalized weakness with associated dyspnea, cough productive of greenish sputum and wheezing.  She was seen twice in the ER for similar symptoms without improvement.   She admits to dyspnea on exertion.  She has been up at night due to her excessive cough despite cough suppressant.  She is unable to take care of her grandchildren.  No fever or chills.  She admits to nausea without vomiting or abdominal pain.  She admits to dysuria, without oliguria, urinary frequency or hematuria or flank pain.  No headache or dizziness or blurred vision.  Patient had a positive COVID with 19 infection about 3 weeks ago.  She was admitted with a working diagnosis of decompensated COPD and started on a regimen of nebulizers and steroids with good result.  The patient is on room air as of 7/1 she still has significantly coarse breath sounds with increased work of breathing.  Nebulizers were transition to inhalers in the setting of active COVID infection   Assessment & Plan:   Active Problems:   COPD exacerbation (HCC)  Acute exacerbation of COPD Likely secondary to acute bronchitis and in setting of recent COVID infection Patient was COVID-positive 3 weeks ago Started on a regimen of steroids and nebulizers Still with very coarse rhonchorous breath sounds and significant cough Plan: DC p.o. prednisone for now Restart IV Solu-Medrol 40 every 8 DuoNebs MDI Dulera twice daily All nebulizers on hold Continue azithromycin 3-day course, last dose today Encourage patient to ambulate  Headache Appears to be related to steroids versus history of COVID  infection Patient endorses migraine-like symptoms No history of migraines Headache appears to have improved Plan: As needed Fioricet  Hyperlipidemia PTA statin  Hypothyroidism PTA Synthroid  Obstructive sleep apnea Nightly CPAP  Atrial fibrillation with controlled ventricular response Does not appear to be on any rate control agents Currently rate controlled at time of my evaluation Continue home Eliquis for VTE prophylaxis  Chronic pain Ultram and Lyrica  Depression PTA Paxil   DVT prophylaxis: Eliquis Code Status: Full Family Communication: Granddaughter Summer [phone] on 7/3 Disposition Plan: Status is: Inpatient  Remains inpatient appropriate because:Inpatient level of care appropriate due to severity of illness  Dispo: The patient is from: Home              Anticipated d/c is to: Home              Patient currently is not medically stable to d/c.   Difficult to place patient No  Acute decompensated COPD in the setting of COVID infection.  Still with significantly rhonchorous breath sounds and wheezing.  Disposition 2 to 3 days     Level of care: Med-Surg  Consultants:  None  Procedures:  None  Antimicrobials:    Subjective: Patient seen and examined.  No complaints of headache this morning but still with some significant cough and rhonchorous breath sounds.  Objective: Vitals:   10/10/20 1932 10/11/20 0346 10/11/20 0824 10/11/20 0924  BP: (!) 143/76 136/86 (!) 152/97   Pulse: 62 61 67   Resp: 20 (!) 22 18   Temp: (!) 97.5 F (36.4 C) 97.7 F (36.5 C) 98 F (36.7  C)   TempSrc: Oral Oral    SpO2: 97% 98% 96% 98%  Weight:      Height:        Intake/Output Summary (Last 24 hours) at 10/11/2020 1154 Last data filed at 10/10/2020 1900 Gross per 24 hour  Intake 480 ml  Output --  Net 480 ml   Filed Weights   10/08/20 1550  Weight: 131.5 kg    Examination:  General exam: No acute distress.  Fatigued Respiratory system: Coarse  rhonchi bilaterally.  Mild and expiratory wheeze.  Room air Cardiovascular system: Regular rate, irregular rhythm, no appreciable murmurs, trace pedal edema Gastrointestinal system: Soft, nontender, nondistended, normal bowel sounds Central nervous system: Alert and oriented. No focal neurological deficits. Extremities: Symmetric 5 x 5 power. Skin: No rashes, lesions or ulcers Psychiatry: Judgement and insight appear normal. Mood & affect appropriate.     Data Reviewed: I have personally reviewed following labs and imaging studies  CBC: Recent Labs  Lab 10/05/20 0920 10/08/20 1551 10/09/20 0701 10/11/20 0732  WBC 6.8 8.8 7.9 11.8*  NEUTROABS 5.0  --   --  9.1*  HGB 15.2* 14.9 14.2 13.5  HCT 46.9* 44.6 41.8 41.4  MCV 83.9 84.2 81.2 82.5  PLT 335 285 266 035   Basic Metabolic Panel: Recent Labs  Lab 10/05/20 0920 10/08/20 1551 10/09/20 0701 10/11/20 0732  NA 138 137 135 136  K 4.1 4.4 4.5 3.8  CL 102 105 103 103  CO2 28 24 21* 27  GLUCOSE 160* 183* 224* 145*  BUN 19 24* 20 23  CREATININE 0.99 0.83 0.75 1.04*  CALCIUM 8.6* 8.9 8.7* 8.9   GFR: Estimated Creatinine Clearance: 64 mL/min (A) (by C-G formula based on SCr of 1.04 mg/dL (H)). Liver Function Tests: Recent Labs  Lab 10/05/20 0920  AST 29  ALT 31  ALKPHOS 78  BILITOT 0.8  PROT 6.8  ALBUMIN 3.4*   No results for input(s): LIPASE, AMYLASE in the last 168 hours. No results for input(s): AMMONIA in the last 168 hours. Coagulation Profile: No results for input(s): INR, PROTIME in the last 168 hours. Cardiac Enzymes: No results for input(s): CKTOTAL, CKMB, CKMBINDEX, TROPONINI in the last 168 hours. BNP (last 3 results) No results for input(s): PROBNP in the last 8760 hours. HbA1C: No results for input(s): HGBA1C in the last 72 hours. CBG: No results for input(s): GLUCAP in the last 168 hours. Lipid Profile: No results for input(s): CHOL, HDL, LDLCALC, TRIG, CHOLHDL, LDLDIRECT in the last 72  hours. Thyroid Function Tests: No results for input(s): TSH, T4TOTAL, FREET4, T3FREE, THYROIDAB in the last 72 hours. Anemia Panel: No results for input(s): VITAMINB12, FOLATE, FERRITIN, TIBC, IRON, RETICCTPCT in the last 72 hours. Sepsis Labs: Recent Labs  Lab 10/05/20 0920 10/05/20 1138  LATICACIDVEN 2.4* 1.7    Recent Results (from the past 240 hour(s))  Resp Panel by RT-PCR (Flu A&B, Covid) Nasopharyngeal Swab     Status: Abnormal   Collection Time: 10/08/20  7:55 PM   Specimen: Nasopharyngeal Swab; Nasopharyngeal(NP) swabs in vial transport medium  Result Value Ref Range Status   SARS Coronavirus 2 by RT PCR POSITIVE (A) NEGATIVE Final    Comment: RESULT CALLED TO, READ BACK BY AND VERIFIED WITH: ROBIN REGISTER @2103  ON 10/08/20 SKL (NOTE) SARS-CoV-2 target nucleic acids are DETECTED.  The SARS-CoV-2 RNA is generally detectable in upper respiratory specimens during the acute phase of infection. Positive results are indicative of the presence of the identified virus, but do  not rule out bacterial infection or co-infection with other pathogens not detected by the test. Clinical correlation with patient history and other diagnostic information is necessary to determine patient infection status. The expected result is Negative.  Fact Sheet for Patients: EntrepreneurPulse.com.au  Fact Sheet for Healthcare Providers: IncredibleEmployment.be  This test is not yet approved or cleared by the [place] FDA and  has been authorized for detection and/or diagnosis of SARS-CoV-2 by FDA under an Emergency Use Authorization (EUA).  This EUA will remain in effect (meaning this test can  be used) for the duration of  the COVID-19 declaration under Section 564(b)(1) of the Act, 21 U.S.C. section 360bbb-3(b)(1), unless the authorization is terminated or revoked sooner.     Influenza A by PCR NEGATIVE NEGATIVE Final   Influenza B by PCR NEGATIVE  NEGATIVE Final    Comment: (NOTE) The Xpert Xpress SARS-CoV-2/FLU/RSV plus assay is intended as an aid in the diagnosis of influenza from Nasopharyngeal swab specimens and should not be used as a sole basis for treatment. Nasal washings and aspirates are unacceptable for Xpert Xpress SARS-CoV-2/FLU/RSV testing.  Fact Sheet for Patients: EntrepreneurPulse.com.au  Fact Sheet for Healthcare Providers: IncredibleEmployment.be  This test is not yet approved or cleared by the [place] FDA and has been authorized for detection and/or diagnosis of SARS-CoV-2 by FDA under an Emergency Use Authorization (EUA). This EUA will remain in effect (meaning this test can be used) for the duration of the COVID-19 declaration under Section 564(b)(1) of the Act, 21 U.S.C. section 360bbb-3(b)(1), unless the authorization is terminated or revoked.  Performed at [facility], [address] [place], [place] [address]   Culture, blood (Routine X 2) w Reflex to ID Panel     Status: None (Preliminary result)   Collection Time: 10/08/20  8:30 PM   Specimen: BLOOD  Result Value Ref Range Status   Specimen Description BLOOD BLOOD LEFT HAND  Final   Special Requests   Final    BOTTLES DRAWN AEROBIC AND ANAEROBIC Blood Culture adequate volume   Culture   Final    NO GROWTH 2 DAYS Performed at [facility], [address], [place], [place] [address]    Report Status PENDING  Incomplete  Culture, blood (Routine X 2) w Reflex to ID Panel     Status: None (Preliminary result)   Collection Time: 10/08/20  8:43 PM   Specimen: BLOOD  Result Value Ref Range Status   Specimen Description BLOOD BLOOD RIGHT HAND  Final   Special Requests   Final    BOTTLES DRAWN AEROBIC AND ANAEROBIC Blood Culture adequate volume   Culture   Final    NO GROWTH 2 DAYS Performed at [facility], [address], [place], [place] [address]    Report Status  PENDING  Incomplete         Radiology Studies: No results found.      Scheduled Meds:  apixaban  5 mg Oral BID   vitamin C  500 mg Oral Daily   atorvastatin  20 mg Oral QPM   azithromycin  500 mg Oral Daily   benzonatate  200 mg Oral TID   cholecalciferol  1,000 Units Oral Daily   dicyclomine  20 mg Oral TID AC   folic acid  1 mg Oral Daily   guaiFENesin  600 mg Oral BID   Ipratropium-Albuterol  1 puff Inhalation Q6H   levothyroxine  50 mcg Oral QAC breakfast   methylPREDNISolone (SOLU-MEDROL) injection  40 mg Intravenous Q8H   mometasone-formoterol  2 puff Inhalation BID   montelukast  10 mg Oral QHS   pantoprazole  40 mg Oral Daily   PARoxetine  20 mg Oral Daily   triamcinolone ointment  1 application Topical UD   zinc sulfate  220 mg Oral Daily   Continuous Infusions:     LOS: 3 days    Time spent: 25 minutes    Sidney Ace, MD [facility] Pager [phone]  If 7PM-7AM, please contact night-coverage 10/11/2020, 11:54 AM

## 2020-10-11 NOTE — Evaluation (Signed)
Physical Therapy Evaluation Patient Details Name: Patricia Walker MRN: [id number] DOB: [birth year] Today's Date: 10/11/2020   History of Present Illness  Patricia Walker is a 75yoF who comes to [facility] on 6/30 c acute onset of generalized weakness and dyspnea, cough c productive green sputum. She was seen twice in the ER for similar symptoms without improvement. PMH: asthma, CAD, depression, HTN, dyslipidemia, hypoTSH, OSA on CPAP. Per chart, positive COVID test 2-3 weeks ago. PCR (+) this admission.  Clinical Impression  Pt admitted with above diagnosis. Pt currently with functional limitations due to the deficits listed below (see "PT Problem List"). Upon entry, pt in bed, awake and agreeable to participate. The pt is alert, pleasant, interactive, and able to provide info regarding prior level of function, both in tolerance and independence. ModI bed mobility, transfers, and requires supervision assistance for AMB, only due to onset lightheadedness and orthostatic BP. Pt has no desaturation with activity. Pt requires no cues for safe SPC use. Patient's performance this date reveals decreased ability, independence, and tolerance in performing all basic mobility required for performance of activities of daily living. Pt requires additional DME, close physical assistance, and cues for safe participate in mobility. Pt will benefit from skilled PT intervention to increase independence and safety with basic mobility in preparation for discharge to the venue listed below.       Follow Up Recommendations Home health PT;Supervision - Intermittent    Equipment Recommendations  None recommended by PT    Recommendations for Other Services       Precautions / Restrictions Precautions Precautions: None Precaution Comments: dizziness, mild orthostatis at eval 7/3 Restrictions Weight Bearing Restrictions: No      Mobility  Bed Mobility Overal bed mobility: Modified Independent                   Transfers Overall transfer level: Modified independent Equipment used: Straight cane                Ambulation/Gait Ambulation/Gait assistance: Supervision Gait Distance (Feet): 160 Feet Assistive device: Straight cane Gait Pattern/deviations: WFL(Within Functional Limits);Trendelenburg     General Gait Details: baseline asymmetry; global weakness in legs with mobilityl progressive lightheadedness with limited resolution; systolic BP drop ~11 mmHg  Stairs            Wheelchair Mobility    Modified Rankin (Stroke Patients Only)       Balance Overall balance assessment: No apparent balance deficits (not formally assessed);Modified Independent                                           Pertinent Vitals/Pain      Home Living Family/patient expects to be discharged to:: Private residence Living Arrangements: Other relatives;Non-relatives/Friends (Sister; 39yo, 71yo grandchildren) Available Help at Discharge: Family Type of Home: House Home Access: Stairs to enter Entrance Stairs-Rails: Right Entrance Stairs-Number of Steps: 3 Home Layout: One level Home Equipment: Cane - single point;Walker - 2 wheels      Prior Function Level of Independence: Independent with assistive device(s) (household distance AMB at baseline, no falls history in 6 months. Electric cart at grocery.)         Comments: Pt was indep with mobility but since getting sick over past several weeks has been using the cane. Indep with basic ADL. Drives. Indep with cleaning, cooking. Grandsons bring in groceries  Hand Dominance   Dominant Hand: Right    Extremity/Trunk Assessment                Communication   Communication: No difficulties  Cognition                                              General Comments      Exercises     Assessment/Plan    PT Assessment Patient needs continued PT services  PT Problem List Decreased  activity tolerance;Decreased balance;Decreased mobility;Decreased strength       PT Treatment Interventions Balance training;Gait training;Functional mobility training;Therapeutic activities;Therapeutic exercise;Patient/family education    PT Goals (Current goals can be found in the Care Plan section)  Acute Rehab PT Goals Patient Stated Goal: feel better and go home when I'm ready PT Goal Formulation: With patient Time For Goal Achievement: 10/25/20 Potential to Achieve Goals: Good    Frequency Min 2X/week   Barriers to discharge        Co-evaluation               AM-PAC PT "6 Clicks" Mobility  Outcome Measure Help needed turning from your back to your side while in a flat bed without using bedrails?: None Help needed moving from lying on your back to sitting on the side of a flat bed without using bedrails?: None Help needed moving to and from a bed to a chair (including a wheelchair)?: None Help needed standing up from a chair using your arms (e.g., wheelchair or bedside chair)?: None Help needed to walk in hospital room?: None Help needed climbing 3-5 steps with a railing? : A Little 6 Click Score: 23    End of Session   Activity Tolerance: Patient tolerated treatment well;Patient limited by fatigue Patient left: in bed;with call bell/phone within reach Nurse Communication: Mobility status PT Visit Diagnosis: Muscle weakness (generalized) (M62.81);Difficulty in walking, not elsewhere classified (R26.2);Other symptoms and signs involving the nervous system (R29.898)    Time: 8115-7262 PT Time Calculation (min) (ACUTE ONLY): 21 min   Charges:   PT Evaluation $PT Eval Low Complexity: 1 Low        12:30 PM, 10/11/20 Etta Grandchild, PT, DPT Physical Therapist - [facility]  [phone] ([place])    [place] [facility] 10/11/2020, 12:27 PM

## 2020-10-12 DIAGNOSIS — J441 Chronic obstructive pulmonary disease with (acute) exacerbation: Secondary | ICD-10-CM | POA: Diagnosis not present

## 2020-10-12 MED ORDER — FUROSEMIDE 10 MG/ML IJ SOLN
40.0000 mg | Freq: Once | INTRAMUSCULAR | Status: AC
  Administered 2020-10-12: 40 mg via INTRAVENOUS
  Filled 2020-10-12: qty 4

## 2020-10-12 MED ORDER — HYDRALAZINE HCL 20 MG/ML IJ SOLN
10.0000 mg | INTRAMUSCULAR | Status: DC | PRN
  Administered 2020-10-12: 10 mg via INTRAVENOUS
  Filled 2020-10-12: qty 1

## 2020-10-12 NOTE — Progress Notes (Signed)
Patient A&O and ambulatory. Reported LE's painful to touch and occasional numbness. Expiratory wheezing in lungs bilaterally and spontaneous coughing with thick, tan, mucous. States that she is, "feeling better." Will continue to monitor.

## 2020-10-12 NOTE — Progress Notes (Signed)
PROGRESS NOTE    Patricia Walker  QVZ:[id number] DOB: 12/10/1945 DOA: 10/08/2020 PCP: Perrin Maltese, MD    Brief Narrative:  75 y.o. Caucasian female with medical history significant for asthma, coronary artery disease, depression, hypertension, dyslipidemia, hypothyroidism and obstructive sleep apnea on CPAP, who presented to the ER with acute onset of worsening generalized weakness with associated dyspnea, cough productive of greenish sputum and wheezing.  She was seen twice in the ER for similar symptoms without improvement.   She admits to dyspnea on exertion.  She has been up at night due to her excessive cough despite cough suppressant.  She is unable to take care of her grandchildren.  No fever or chills.  She admits to nausea without vomiting or abdominal pain.  She admits to dysuria, without oliguria, urinary frequency or hematuria or flank pain.  No headache or dizziness or blurred vision.  Patient had a positive COVID with 19 infection about 3 weeks ago.  She was admitted with a working diagnosis of decompensated COPD and started on a regimen of nebulizers and steroids with good result.  The patient is on room air as of 7/1 she still has significantly coarse breath sounds with increased work of breathing.  Nebulizers were transition to inhalers in the setting of active COVID infection   Assessment & Plan:   Active Problems:   COPD exacerbation (HCC)  Acute exacerbation of COPD Likely secondary to acute bronchitis and in setting of recent COVID infection Patient was COVID-positive 3 weeks ago Started on a regimen of steroids and nebulizers Still with very coarse rhonchorous breath sounds and significant cough Plan: Continue Solu-Medrol 40 mg every 8 hours for today Aggressively taper starting tomorrow DuoNebs MDI Dulera twice daily All nebulizers on hold Completed azithromycin 3-day course, no further antibiotics Encourage patient to ambulate Therapy  evaluations  Hypertension No established diagnosis Blood pressure running high in the setting of steroid use, COPD, headache Plan: Lasix 40 mg IV x1 As needed IV hydralazine  Headache Appears to be related to steroids versus history of COVID infection Patient endorses migraine-like symptoms No history of migraines Headache appears to have improved Plan: As needed Fioricet  Hyperlipidemia PTA statin  Hypothyroidism PTA Synthroid  Obstructive sleep apnea Nightly CPAP  Atrial fibrillation with controlled ventricular response Does not appear to be on any rate control agents Currently rate controlled at time of my evaluation Continue home Eliquis for VTE prophylaxis  Chronic pain Ultram and Lyrica  Depression PTA Paxil   DVT prophylaxis: Eliquis Code Status: Full Family Communication: Granddaughter Summer [phone] on 7/3 Disposition Plan: Status is: Inpatient  Remains inpatient appropriate because:Inpatient level of care appropriate due to severity of illness  Dispo: The patient is from: Home              Anticipated d/c is to: Home              Patient currently is not medically stable to d/c.   Difficult to place patient No  Acute decompensated COPD in the setting of COVID infection.  Still with significantly rhonchorous breath sounds and wheezing.  Disposition 2 to 3 days     Level of care: Med-Surg  Consultants:  None  Procedures:  None  Antimicrobials:    Subjective: Patient seen and examined.  Reports he is feeling better.  Still with significant cough and shortness of breath associated with wheezing.  Objective: Vitals:   10/11/20 1913 10/12/20 0421 10/12/20 0800 10/12/20 1200  BP: [name] Kitchen)  138/97 (!) 145/68 (!) 160/90 138/74  Pulse: 80 63 66 84  Resp: 20 20 16 16   Temp: 98.5 F (36.9 C) 98.7 F (37.1 C) 97.9 F (36.6 C)   TempSrc: Oral Oral    SpO2: 96% 95% 94% 95%  Weight:      Height:        Intake/Output Summary (Last 24 hours)  at 10/12/2020 1229 Last data filed at 10/12/2020 0601 Gross per 24 hour  Intake --  Output 0 ml  Net 0 ml   Filed Weights   10/08/20 1550  Weight: 131.5 kg    Examination:  General exam: No acute distress Respiratory system: Scattered coarse rhonchi bilaterally.  Improved air movement.  And expiratory wheeze.  Room air Cardiovascular system: Regular rate, irregular rhythm, no appreciable murmurs, trace pedal edema Gastrointestinal system: Soft, nontender, nondistended, normal bowel sounds Central nervous system: Alert and oriented. No focal neurological deficits. Extremities: Symmetric 5 x 5 power. Skin: No rashes, lesions or ulcers Psychiatry: Judgement and insight appear normal. Mood & affect appropriate.     Data Reviewed: I have personally reviewed following labs and imaging studies  CBC: Recent Labs  Lab 10/08/20 1551 10/09/20 0701 10/11/20 0732  WBC 8.8 7.9 11.8*  NEUTROABS  --   --  9.1*  HGB 14.9 14.2 13.5  HCT 44.6 41.8 41.4  MCV 84.2 81.2 82.5  PLT 285 266 454   Basic Metabolic Panel: Recent Labs  Lab 10/08/20 1551 10/09/20 0701 10/11/20 0732  NA 137 135 136  K 4.4 4.5 3.8  CL 105 103 103  CO2 24 21* 27  GLUCOSE 183* 224* 145*  BUN 24* 20 23  CREATININE 0.83 0.75 1.04*  CALCIUM 8.9 8.7* 8.9   GFR: Estimated Creatinine Clearance: 64 mL/min (A) (by C-G formula based on SCr of 1.04 mg/dL (H)). Liver Function Tests: No results for input(s): AST, ALT, ALKPHOS, BILITOT, PROT, ALBUMIN in the last 168 hours.  No results for input(s): LIPASE, AMYLASE in the last 168 hours. No results for input(s): AMMONIA in the last 168 hours. Coagulation Profile: No results for input(s): INR, PROTIME in the last 168 hours. Cardiac Enzymes: No results for input(s): CKTOTAL, CKMB, CKMBINDEX, TROPONINI in the last 168 hours. BNP (last 3 results) No results for input(s): PROBNP in the last 8760 hours. HbA1C: No results for input(s): HGBA1C in the last 72  hours. CBG: No results for input(s): GLUCAP in the last 168 hours. Lipid Profile: No results for input(s): CHOL, HDL, LDLCALC, TRIG, CHOLHDL, LDLDIRECT in the last 72 hours. Thyroid Function Tests: No results for input(s): TSH, T4TOTAL, FREET4, T3FREE, THYROIDAB in the last 72 hours. Anemia Panel: No results for input(s): VITAMINB12, FOLATE, FERRITIN, TIBC, IRON, RETICCTPCT in the last 72 hours. Sepsis Labs: No results for input(s): PROCALCITON, LATICACIDVEN in the last 168 hours.   Recent Results (from the past 240 hour(s))  Resp Panel by RT-PCR (Flu A&B, Covid) Nasopharyngeal Swab     Status: Abnormal   Collection Time: 10/08/20  7:55 PM   Specimen: Nasopharyngeal Swab; Nasopharyngeal(NP) swabs in vial transport medium  Result Value Ref Range Status   SARS Coronavirus 2 by RT PCR POSITIVE (A) NEGATIVE Final    Comment: RESULT CALLED TO, READ BACK BY AND VERIFIED WITH: ROBIN REGISTER @2103  ON 10/08/20 SKL (NOTE) SARS-CoV-2 target nucleic acids are DETECTED.  The SARS-CoV-2 RNA is generally detectable in upper respiratory specimens during the acute phase of infection. Positive results are indicative of the presence of the identified  virus, but do not rule out bacterial infection or co-infection with other pathogens not detected by the test. Clinical correlation with patient history and other diagnostic information is necessary to determine patient infection status. The expected result is Negative.  Fact Sheet for Patients: EntrepreneurPulse.com.au  Fact Sheet for Healthcare Providers: IncredibleEmployment.be  This test is not yet approved or cleared by the [place] FDA and  has been authorized for detection and/or diagnosis of SARS-CoV-2 by FDA under an Emergency Use Authorization (EUA).  This EUA will remain in effect (meaning this test can  be used) for the duration of  the COVID-19 declaration under Section 564(b)(1) of the Act,  21 U.S.C. section 360bbb-3(b)(1), unless the authorization is terminated or revoked sooner.     Influenza A by PCR NEGATIVE NEGATIVE Final   Influenza B by PCR NEGATIVE NEGATIVE Final    Comment: (NOTE) The Xpert Xpress SARS-CoV-2/FLU/RSV plus assay is intended as an aid in the diagnosis of influenza from Nasopharyngeal swab specimens and should not be used as a sole basis for treatment. Nasal washings and aspirates are unacceptable for Xpert Xpress SARS-CoV-2/FLU/RSV testing.  Fact Sheet for Patients: EntrepreneurPulse.com.au  Fact Sheet for Healthcare Providers: IncredibleEmployment.be  This test is not yet approved or cleared by the [place] FDA and has been authorized for detection and/or diagnosis of SARS-CoV-2 by FDA under an Emergency Use Authorization (EUA). This EUA will remain in effect (meaning this test can be used) for the duration of the COVID-19 declaration under Section 564(b)(1) of the Act, 21 U.S.C. section 360bbb-3(b)(1), unless the authorization is terminated or revoked.  Performed at [facility], [address] [place], [place] [address]   Culture, blood (Routine X 2) w Reflex to ID Panel     Status: None (Preliminary result)   Collection Time: 10/08/20  8:30 PM   Specimen: BLOOD  Result Value Ref Range Status   Specimen Description BLOOD BLOOD LEFT HAND  Final   Special Requests   Final    BOTTLES DRAWN AEROBIC AND ANAEROBIC Blood Culture adequate volume   Culture   Final    NO GROWTH 4 DAYS Performed at [facility], [address], [place], [place] [address]    Report Status PENDING  Incomplete  Culture, blood (Routine X 2) w Reflex to ID Panel     Status: None (Preliminary result)   Collection Time: 10/08/20  8:43 PM   Specimen: BLOOD  Result Value Ref Range Status   Specimen Description BLOOD BLOOD RIGHT HAND  Final   Special Requests   Final    BOTTLES DRAWN AEROBIC AND ANAEROBIC  Blood Culture adequate volume   Culture   Final    NO GROWTH 4 DAYS Performed at [facility], [address], [place], [place] [address]    Report Status PENDING  Incomplete         Radiology Studies: No results found.      Scheduled Meds:  apixaban  5 mg Oral BID   vitamin C  500 mg Oral Daily   atorvastatin  20 mg Oral QPM   azithromycin  500 mg Oral Daily   benzonatate  200 mg Oral TID   cholecalciferol  1,000 Units Oral Daily   dicyclomine  20 mg Oral TID AC   folic acid  1 mg Oral Daily   guaiFENesin  600 mg Oral BID   Ipratropium-Albuterol  1 puff Inhalation Q6H   levothyroxine  50 mcg Oral QAC breakfast  methylPREDNISolone (SOLU-MEDROL) injection  40 mg Intravenous Q8H   mometasone-formoterol  2 puff Inhalation BID   montelukast  10 mg Oral QHS   pantoprazole  40 mg Oral Daily   PARoxetine  20 mg Oral Daily   triamcinolone ointment  1 application Topical UD   zinc sulfate  220 mg Oral Daily   Continuous Infusions:     LOS: 4 days    Time spent: 25 minutes    Sidney Ace, MD [facility] Pager [phone]  If 7PM-7AM, please contact night-coverage 10/12/2020, 12:29 PM

## 2020-10-13 DIAGNOSIS — J441 Chronic obstructive pulmonary disease with (acute) exacerbation: Secondary | ICD-10-CM | POA: Diagnosis not present

## 2020-10-13 LAB — CBC WITH DIFFERENTIAL/PLATELET
Abs Immature Granulocytes: 0.12 10*3/uL — ABNORMAL HIGH (ref 0.00–0.07)
Basophils Absolute: 0 10*3/uL (ref 0.0–0.1)
Basophils Relative: 0 %
Eosinophils Absolute: 0 10*3/uL (ref 0.0–0.5)
Eosinophils Relative: 0 %
HCT: 43.8 % (ref 36.0–46.0)
Hemoglobin: 14.8 g/dL (ref 12.0–15.0)
Immature Granulocytes: 1 %
Lymphocytes Relative: 7 %
Lymphs Abs: 0.7 10*3/uL (ref 0.7–4.0)
MCH: 27.6 pg (ref 26.0–34.0)
MCHC: 33.8 g/dL (ref 30.0–36.0)
MCV: 81.6 fL (ref 80.0–100.0)
Monocytes Absolute: 0.4 10*3/uL (ref 0.1–1.0)
Monocytes Relative: 4 %
Neutro Abs: 9.1 10*3/uL — ABNORMAL HIGH (ref 1.7–7.7)
Neutrophils Relative %: 88 %
Platelets: 313 10*3/uL (ref 150–400)
RBC: 5.37 MIL/uL — ABNORMAL HIGH (ref 3.87–5.11)
RDW: 14.1 % (ref 11.5–15.5)
WBC: 10.3 10*3/uL (ref 4.0–10.5)
nRBC: 0 % (ref 0.0–0.2)

## 2020-10-13 LAB — CULTURE, BLOOD (ROUTINE X 2)
Culture: NO GROWTH
Culture: NO GROWTH
Special Requests: ADEQUATE
Special Requests: ADEQUATE

## 2020-10-13 LAB — BASIC METABOLIC PANEL
Anion gap: 10 (ref 5–15)
BUN: 29 mg/dL — ABNORMAL HIGH (ref 8–23)
CO2: 26 mmol/L (ref 22–32)
Calcium: 9.2 mg/dL (ref 8.9–10.3)
Chloride: 99 mmol/L (ref 98–111)
Creatinine, Ser: 1.1 mg/dL — ABNORMAL HIGH (ref 0.44–1.00)
GFR, Estimated: 53 mL/min — ABNORMAL LOW (ref >60)
Glucose, Bld: 252 mg/dL — ABNORMAL HIGH (ref 70–99)
Potassium: 3.9 mmol/L (ref 3.5–5.1)
Sodium: 135 mmol/L (ref 135–145)

## 2020-10-13 MED ORDER — METHYLPREDNISOLONE SODIUM SUCC 40 MG IJ SOLR
40.0000 mg | Freq: Two times a day (BID) | INTRAMUSCULAR | Status: DC
  Administered 2020-10-13 – 2020-10-14 (×2): 40 mg via INTRAVENOUS
  Filled 2020-10-13 (×2): qty 1

## 2020-10-13 NOTE — Progress Notes (Signed)
PROGRESS NOTE    Patricia Walker  PXT:[id number] DOB: 07-28-1945 DOA: 10/08/2020 PCP: Perrin Maltese, MD    Brief Narrative:  75 y.o. Caucasian female with medical history significant for asthma, coronary artery disease, depression, hypertension, dyslipidemia, hypothyroidism and obstructive sleep apnea on CPAP, who presented to the ER with acute onset of worsening generalized weakness with associated dyspnea, cough productive of greenish sputum and wheezing.  She was seen twice in the ER for similar symptoms without improvement.   She admits to dyspnea on exertion.  She has been up at night due to her excessive cough despite cough suppressant.  She is unable to take care of her grandchildren.  No fever or chills.  She admits to nausea without vomiting or abdominal pain.  She admits to dysuria, without oliguria, urinary frequency or hematuria or flank pain.  No headache or dizziness or blurred vision.  Patient had a positive COVID with 19 infection about 3 weeks ago.  She was admitted with a working diagnosis of decompensated COPD and started on a regimen of nebulizers and steroids with good result.  The patient is on room air as of 7/1 she still has significantly coarse breath sounds with increased work of breathing.  Nebulizers were transition to inhalers in the setting of active COVID infection   Assessment & Plan:   Active Problems:   COPD exacerbation (HCC)  Acute exacerbation of COPD Likely secondary to acute bronchitis and in setting of recent COVID infection Patient was COVID-positive 3 weeks ago Started on a regimen of steroids and nebulizers Still with very coarse rhonchorous breath sounds and significant cough Plan: Wean steroids, decrease Solu-Medrol to 40 mg IV every 12 hours Can continue to taper steroids as she improves clinically DuoNebs MDI Dulera twice daily All nebulizers on hold Completed azithromycin 3-day course, no further antibiotics Encourage patient to  ambulate Therapy evaluations, home health recommended Home PT and OT ordered  Hypertension No established diagnosis Blood pressure running high in the setting of steroid use, COPD, headache Plan: As needed IV hydralazine  Headache Appears to be related to steroids versus history of COVID infection Patient endorses migraine-like symptoms No history of migraines Headache appears to have improved Plan: As needed Fioricet  Hyperlipidemia PTA statin  Hypothyroidism PTA Synthroid  Obstructive sleep apnea Nightly CPAP  Atrial fibrillation with controlled ventricular response Does not appear to be on any rate control agents Currently rate controlled at time of my evaluation Continue home Eliquis for VTE prophylaxis  Chronic pain Ultram and Lyrica  Depression PTA Paxil   DVT prophylaxis: Eliquis Code Status: Full Family Communication: Granddaughter Summer [phone] on 7/5 Disposition Plan: Status is: Inpatient  Remains inpatient appropriate because:Inpatient level of care appropriate due to severity of illness  Dispo: The patient is from: Home              Anticipated d/c is to: Home              Patient currently is not medically stable to d/c.   Difficult to place patient No  Acute decompensated COPD in setting of COVID infection.  Clinically improving, on room air but still with significantly rhonchorous breath sounds and increased work of breathing.  Suspect 1 to 2 days prior to discharge.     Level of care: Med-Surg  Consultants:  None  Procedures:  None  Antimicrobials:    Subjective: Patient seen and examined.  Reports slow but steady improvement.  Still with significant cough and shortness  of breath.  Wheezing improved. Objective: Vitals:   10/12/20 1200 10/12/20 1529 10/12/20 2030 10/13/20 0313  BP: 138/74 140/79 130/73 127/81  Pulse: 84 60 78 66  Resp: 16 18 19 15   Temp:  98 F (36.7 C) 97.7 F (36.5 C) 97.7 F (36.5 C)  TempSrc:     Oral  SpO2: 95% 95% 92% 95%  Weight:      Height:       No intake or output data in the 24 hours ending 10/13/20 1120  Filed Weights   10/08/20 1550  Weight: 131.5 kg    Examination:  General exam: No acute distress Respiratory system: Scattered coarse rhonchi bilaterally.  Improved air movement.  Improved end expiratory wheeze.  Room air Cardiovascular system: Regular rate, irregular rhythm, no murmurs, trace pedal edema Gastrointestinal system: Soft, nontender, nondistended, normal bowel sounds Central nervous system: Alert and oriented. No focal neurological deficits. Extremities: Symmetric 5 x 5 power. Skin: No rashes, lesions or ulcers Psychiatry: Judgement and insight appear normal. Mood & affect appropriate.     Data Reviewed: I have personally reviewed following labs and imaging studies  CBC: Recent Labs  Lab 10/08/20 1551 10/09/20 0701 10/11/20 0732 10/13/20 0900  WBC 8.8 7.9 11.8* 10.3  NEUTROABS  --   --  9.1* 9.1*  HGB 14.9 14.2 13.5 14.8  HCT 44.6 41.8 41.4 43.8  MCV 84.2 81.2 82.5 81.6  PLT 285 266 264 332   Basic Metabolic Panel: Recent Labs  Lab 10/08/20 1551 10/09/20 0701 10/11/20 0732 10/13/20 0900  NA 137 135 136 135  K 4.4 4.5 3.8 3.9  CL 105 103 103 99  CO2 24 21* 27 26  GLUCOSE 183* 224* 145* 252*  BUN 24* 20 23 29*  CREATININE 0.83 0.75 1.04* 1.10*  CALCIUM 8.9 8.7* 8.9 9.2   GFR: Estimated Creatinine Clearance: 60.5 mL/min (A) (by C-G formula based on SCr of 1.1 mg/dL (H)). Liver Function Tests: No results for input(s): AST, ALT, ALKPHOS, BILITOT, PROT, ALBUMIN in the last 168 hours.  No results for input(s): LIPASE, AMYLASE in the last 168 hours. No results for input(s): AMMONIA in the last 168 hours. Coagulation Profile: No results for input(s): INR, PROTIME in the last 168 hours. Cardiac Enzymes: No results for input(s): CKTOTAL, CKMB, CKMBINDEX, TROPONINI in the last 168 hours. BNP (last 3 results) No results for  input(s): PROBNP in the last 8760 hours. HbA1C: No results for input(s): HGBA1C in the last 72 hours. CBG: No results for input(s): GLUCAP in the last 168 hours. Lipid Profile: No results for input(s): CHOL, HDL, LDLCALC, TRIG, CHOLHDL, LDLDIRECT in the last 72 hours. Thyroid Function Tests: No results for input(s): TSH, T4TOTAL, FREET4, T3FREE, THYROIDAB in the last 72 hours. Anemia Panel: No results for input(s): VITAMINB12, FOLATE, FERRITIN, TIBC, IRON, RETICCTPCT in the last 72 hours. Sepsis Labs: No results for input(s): PROCALCITON, LATICACIDVEN in the last 168 hours.   Recent Results (from the past 240 hour(s))  Resp Panel by RT-PCR (Flu A&B, Covid) Nasopharyngeal Swab     Status: Abnormal   Collection Time: 10/08/20  7:55 PM   Specimen: Nasopharyngeal Swab; Nasopharyngeal(NP) swabs in vial transport medium  Result Value Ref Range Status   SARS Coronavirus 2 by RT PCR POSITIVE (A) NEGATIVE Final    Comment: RESULT CALLED TO, READ BACK BY AND VERIFIED WITH: ROBIN REGISTER @2103  ON 10/08/20 SKL (NOTE) SARS-CoV-2 target nucleic acids are DETECTED.  The SARS-CoV-2 RNA is generally detectable in upper respiratory specimens  during the acute phase of infection. Positive results are indicative of the presence of the identified virus, but do not rule out bacterial infection or co-infection with other pathogens not detected by the test. Clinical correlation with patient history and other diagnostic information is necessary to determine patient infection status. The expected result is Negative.  Fact Sheet for Patients: EntrepreneurPulse.com.au  Fact Sheet for Healthcare Providers: IncredibleEmployment.be  This test is not yet approved or cleared by the [place] FDA and  has been authorized for detection and/or diagnosis of SARS-CoV-2 by FDA under an Emergency Use Authorization (EUA).  This EUA will remain in effect (meaning this test can   be used) for the duration of  the COVID-19 declaration under Section 564(b)(1) of the Act, 21 U.S.C. section 360bbb-3(b)(1), unless the authorization is terminated or revoked sooner.     Influenza A by PCR NEGATIVE NEGATIVE Final   Influenza B by PCR NEGATIVE NEGATIVE Final    Comment: (NOTE) The Xpert Xpress SARS-CoV-2/FLU/RSV plus assay is intended as an aid in the diagnosis of influenza from Nasopharyngeal swab specimens and should not be used as a sole basis for treatment. Nasal washings and aspirates are unacceptable for Xpert Xpress SARS-CoV-2/FLU/RSV testing.  Fact Sheet for Patients: EntrepreneurPulse.com.au  Fact Sheet for Healthcare Providers: IncredibleEmployment.be  This test is not yet approved or cleared by the [place] FDA and has been authorized for detection and/or diagnosis of SARS-CoV-2 by FDA under an Emergency Use Authorization (EUA). This EUA will remain in effect (meaning this test can be used) for the duration of the COVID-19 declaration under Section 564(b)(1) of the Act, 21 U.S.C. section 360bbb-3(b)(1), unless the authorization is terminated or revoked.  Performed at [facility], [address] [place], [place] [address]   Culture, blood (Routine X 2) w Reflex to ID Panel     Status: None   Collection Time: 10/08/20  8:30 PM   Specimen: BLOOD  Result Value Ref Range Status   Specimen Description BLOOD BLOOD LEFT HAND  Final   Special Requests   Final    BOTTLES DRAWN AEROBIC AND ANAEROBIC Blood Culture adequate volume   Culture   Final    NO GROWTH 5 DAYS Performed at [facility], [address] [place], [place] [address]    Report Status 10/13/2020 FINAL  Final  Culture, blood (Routine X 2) w Reflex to ID Panel     Status: None   Collection Time: 10/08/20  8:43 PM   Specimen: BLOOD  Result Value Ref Range Status   Specimen Description BLOOD BLOOD RIGHT HAND  Final   Special  Requests   Final    BOTTLES DRAWN AEROBIC AND ANAEROBIC Blood Culture adequate volume   Culture   Final    NO GROWTH 5 DAYS Performed at [facility], [address] [place], [place] [address]    Report Status 10/13/2020 FINAL  Final         Radiology Studies: No results found.      Scheduled Meds:  apixaban  5 mg Oral BID   vitamin C  500 mg Oral Daily   atorvastatin  20 mg Oral QPM   benzonatate  200 mg Oral TID   cholecalciferol  1,000 Units Oral Daily   dicyclomine  20 mg Oral TID AC   folic acid  1 mg Oral Daily   guaiFENesin  600 mg Oral BID   Ipratropium-Albuterol  1 puff Inhalation Q6H   levothyroxine  50  mcg Oral QAC breakfast   methylPREDNISolone (SOLU-MEDROL) injection  40 mg Intravenous Q12H   mometasone-formoterol  2 puff Inhalation BID   montelukast  10 mg Oral QHS   pantoprazole  40 mg Oral Daily   PARoxetine  20 mg Oral Daily   triamcinolone ointment  1 application Topical UD   zinc sulfate  220 mg Oral Daily   Continuous Infusions:     LOS: 5 days    Time spent: 25 minutes    Sidney Ace, MD [facility] Pager [phone]  If 7PM-7AM, please contact night-coverage 10/13/2020, 11:20 AM

## 2020-10-13 NOTE — Progress Notes (Signed)
Physical Therapy Treatment Patient Details Name: Patricia Walker MRN: [id number] DOB: Jul 24, 1945 Today's Date: 10/13/2020    History of Present Illness Patricia Walker is a 89yoF who comes to [facility] on 6/30 c acute onset of generalized weakness and dyspnea, cough c productive green sputum. She was seen twice in the ER for similar symptoms without improvement. PMH: asthma, CAD, depression, HTN, dyslipidemia, hypoTSH, OSA on CPAP. Per chart, positive COVID test 2-3 weeks ago. PCR (+) this admission.    PT Comments    Patient is agreeable to PT session. Reports she just walked some in the room, agrees to do more. She is motivated to improve and get home. Reports she has been walking on her own several times a day. She is independent with bed mobility and transfers and independent with gait in room using SPC. Patient limited by sob and fatigue. Safe with mobility. Will continue to monitor while here for continued improvements with strength and endurance.      Follow Up Recommendations  No PT follow up     Equipment Recommendations  None recommended by PT    Recommendations for Other Services       Precautions / Restrictions Precautions Precautions: None Restrictions Weight Bearing Restrictions: No    Mobility  Bed Mobility Overal bed mobility: Independent                  Transfers Overall transfer level: Independent Equipment used: Straight cane             General transfer comment: fatigued and sob with activity, O2 sats remained in 90%s  Ambulation/Gait Ambulation/Gait assistance: Independent Gait Distance (Feet): 70 Feet Assistive device: Straight cane Gait Pattern/deviations: WFL(Within Functional Limits) Gait velocity: WFL   General Gait Details: no difficulties ambulating, Has been ambulating in room independently several times a day. No physical assist needed.   Stairs             Wheelchair Mobility    Modified Rankin (Stroke Patients Only)        Balance Overall balance assessment: No apparent balance deficits (not formally assessed);Modified Independent                                          Cognition Arousal/Alertness: Awake/alert Behavior During Therapy: WFL for tasks assessed/performed Overall Cognitive Status: Within Functional Limits for tasks assessed                                        Exercises      General Comments        Pertinent Vitals/Pain Pain Assessment: No/denies pain    Home Living                      Prior Function            PT Goals (current goals can now be found in the care plan section) Acute Rehab PT Goals Patient Stated Goal: feel better and go home when I'm ready PT Goal Formulation: With patient Time For Goal Achievement: 10/25/20 Potential to Achieve Goals: Good Progress towards PT goals: Progressing toward goals    Frequency    Min 2X/week      PT Plan Current plan remains appropriate    Co-evaluation  AM-PAC PT "6 Clicks" Mobility   Outcome Measure  Help needed turning from your back to your side while in a flat bed without using bedrails?: None Help needed moving from lying on your back to sitting on the side of a flat bed without using bedrails?: None Help needed moving to and from a bed to a chair (including a wheelchair)?: None Help needed standing up from a chair using your arms (e.g., wheelchair or bedside chair)?: None Help needed to walk in hospital room?: None Help needed climbing 3-5 steps with a railing? : A Little 6 Click Score: 23    End of Session   Activity Tolerance: Patient tolerated treatment well;Patient limited by fatigue Patient left: in bed;with call bell/phone within reach Nurse Communication: Mobility status PT Visit Diagnosis: Difficulty in walking, not elsewhere classified (R26.2)     Time: 9872-1587 PT Time Calculation (min) (ACUTE ONLY): 8 min  Charges:   $Gait Training: 8-22 mins                     {Kellar Westberg, PT, GCS 10/13/20,11:32 AM

## 2020-10-13 NOTE — Progress Notes (Signed)
Mobility Specialist - Progress Note   10/13/20 1500  Mobility  Activity Ambulated in room  Level of Assistance Modified independent, requires aide device or extra time  Assistive Device Front wheel walker  Distance Ambulated (ft) 50 ft  Mobility Ambulated independently in room  Mobility Response Tolerated well  Mobility performed by Mobility specialist  $Mobility charge 1 Mobility    During mobility: 89 HR, 96% SpO2   Pt motivated for activity. Ambulated in room with SPC, no LOB. Voiced fatigue, but well aware of self-pacing and activity tolerance. Mild dizziness once seated, BP taken: 136/101 EOB and 147/84 once returned supine. Tolerated well. RN notified.    Kathee Delton Mobility Specialist 10/13/20, 3:42 PM

## 2020-10-14 DIAGNOSIS — J441 Chronic obstructive pulmonary disease with (acute) exacerbation: Secondary | ICD-10-CM | POA: Diagnosis not present

## 2020-10-14 MED ORDER — GUAIFENESIN-DM 100-10 MG/5ML PO SYRP: 5.0000 mL | ORAL_SOLUTION | ORAL | 0 refills | Status: DC | PRN

## 2020-10-14 MED ORDER — PREDNISONE 10 MG (21) PO TBPK: ORAL_TABLET | ORAL | 0 refills | Status: DC

## 2020-10-14 MED ORDER — GUAIFENESIN ER 600 MG PO TB12: 600.0000 mg | ORAL_TABLET | Freq: Two times a day (BID) | ORAL | 0 refills | Status: AC

## 2020-10-14 MED ORDER — BENZONATATE 100 MG PO CAPS: 100.0000 mg | ORAL_CAPSULE | Freq: Four times a day (QID) | ORAL | 0 refills | Status: AC | PRN

## 2020-10-14 NOTE — Progress Notes (Signed)
Patient discharged to home via wheelchair. Patient given all pertinent information, prescriptions and personal belongings.  Patient able to teach back discharge instructions.  IV site d/ced.  No acute distress noted.  Care relinquished.

## 2020-10-14 NOTE — Progress Notes (Signed)
Occupational Therapy Treatment Patient Details Name: Patricia Walker MRN: [id number] DOB: 13-Apr-1945 Today's Date: 10/14/2020    History of present illness Patricia Walker is a 17yoF who comes to [facility] on 6/30 c acute onset of generalized weakness and dyspnea, cough c productive green sputum. She was seen twice in the ER for similar symptoms without improvement. PMH: asthma, CAD, depression, HTN, dyslipidemia, hypoTSH, OSA on CPAP. Per chart, positive COVID test 2-3 weeks ago. PCR (+) this admission.   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 Upon arrival to room pt reclinedin bed, eager for OOB mobility. Pt requires single UE support standing with dynamic activity reaching inside BOS, no LOBs noted navigating obstacles for ~60 ft mobility in room. Pt independently verbalized plan to implement ECS. All goals met and education complete. Pt functioning near baseline independence, no further skilled acute OT needs. Will sign off. Pt agreeable to plan, all questions answered. No OT follow up needed.    Follow Up Recommendations  No OT follow up    Equipment Recommendations   (reacher)    Recommendations for Other Services      Precautions / Restrictions Precautions Precautions: None Restrictions Weight Bearing Restrictions: No       Mobility Bed Mobility Overal bed mobility: Independent                  Transfers Overall transfer level: Modified independent                    Balance Overall balance assessment: No apparent balance deficits (not formally assessed);Modified Independent                                         ADL either performed or assessed with clinical judgement   ADL Overall ADL's : Modified independent                                       General ADL Comments: single UE support standing with dynamic activity reaching inside BOS      Cognition Arousal/Alertness: Awake/alert Behavior During  Therapy: WFL for tasks assessed/performed Overall Cognitive Status: Within Functional Limits for tasks assessed                                          Exercises Exercises: Other exercises Other Exercises Other Exercises: Pt independently verbalized plan to implement ECS. Independent in room mobility ~60 ft including navigating obstacles           Pertinent Vitals/ Pain       Pain Assessment: No/denies pain         Frequency  Min 1X/week        Progress Toward Goals  OT Goals(current goals can now be found in the care plan section)  Progress towards OT goals: Goals met/education completed, patient discharged from OT  Acute Rehab OT Goals Patient Stated Goal: feel better and go home when I'm ready OT Goal Formulation: All assessment and education complete, DC therapy Time For Goal Achievement: 10/24/20 Potential to Achieve Goals: Good ADL Goals Additional ADL Goal #1: Pt will verbalize plan to implement at least 2 learned energy conservation strategies  to maximize safety/indep with ADL/mobility while recovering. Additional ADL Goal #2: Pt will verbalize plan to implement at least 1 learned falls prevention strategy to maximize safety with ADL/mobility and minimize falls risk.  Plan All goals met and education completed, patient discharged from OT services;Discharge plan needs to be updated       AM-PAC OT "6 Clicks" Daily Activity     Outcome Measure   Help from another person eating meals?: None Help from another person taking care of personal grooming?: None Help from another person toileting, which includes using toliet, bedpan, or urinal?: None Help from another person bathing (including washing, rinsing, drying)?: None Help from another person to put on and taking off regular upper body clothing?: None Help from another person to put on and taking off regular lower body clothing?: None 6 Click Score: 24    End of Session Equipment Utilized  During Treatment:  [facility])  OT Visit Diagnosis: Other abnormalities of gait and mobility (R26.89);Muscle weakness (generalized) (M62.81)   Activity Tolerance Patient tolerated treatment well   Patient Left in bed;with call bell/phone within reach   Nurse Communication          Time: 5038-8828 OT Time Calculation (min): 12 min  Charges: OT General Charges $OT Visit: 1 Visit OT Treatments $Self Care/Home Management : 8-22 mins  Patricia Walker, M.S. OTR/L  10/14/20, 1:04 PM  ascom [phone]

## 2020-10-14 NOTE — Discharge Summary (Signed)
[facility] Discharge Summary   Patient: Patricia Walker KWI:[id number]  PCP: Perrin Maltese, MD  Date of admission: 10/08/2020   Date of discharge:  10/14/2020     Discharge Diagnoses:  Principal diagnosis Acute COPD exacerbation Active Problems:   COPD exacerbation ([place])   Admitted From: Home Disposition:  Home   Recommendations for Outpatient Follow-up:  PCP: in 1 wk Follow up LABS/TEST:     Diet recommendation: Cardiac diet  Activity: The patient is advised to gradually reintroduce usual activities, as tolerated  Discharge Condition: stable  Code Status: Full code   History of present illness: As per the H and P dictated on admission 75 y.o. Caucasian female with medical history significant for asthma, coronary artery disease, depression, hypertension, dyslipidemia, hypothyroidism and obstructive sleep apnea on CPAP, who presented to the ER with acute onset of worsening generalized weakness with associated dyspnea, cough productive of greenish sputum and wheezing.  She was seen twice in the ER for similar symptoms without improvement.   She admits to dyspnea on exertion.  She has been up at night due to her excessive cough despite cough suppressant.  She is unable to take care of her grandchildren.  No fever or chills.  She admits to nausea without vomiting or abdominal pain.  She admits to dysuria, without oliguria, urinary frequency or hematuria or flank pain.  No headache or dizziness or blurred vision.  Patient had a positive COVID with 19 infection about 3 weeks ago.   She was admitted with a working diagnosis of decompensated COPD and started on a regimen of nebulizers and steroids with good result.  The patient is on room air as of 7/1 she still has significantly coarse breath sounds with increased work of breathing.  Nebulizers were transition to inhalers in the setting of active COVID infection  Hospital Course:  Active Problems:   COPD exacerbation ([place]) # Acute  exacerbation of COPD, Likely secondary to acute bronchitis and in setting of recent COVID infection. Patient was COVID-positive 3 weeks ago. S/p steroids and nebulizers, gradually improved. Weaned steroids, decrease Solu-Medrol to 40 mg IV every 12 hours.  Transition to oral tapering dose of prednisone on discharge.  Advised to continue inhalers.  S/p Z-Pak antibiotics, no more need of antibiotics at discharge.  Patient was advised to follow with PCP in 1 week. # Hypertension, No established diagnosis, Blood pressure was running high in the setting of steroid use, COPD, headache. S/p as needed IV hydralazine.  Patient was advised to monitor BP at home and follow with PCP for further management # Headache, Appears to be related to steroids versus history of COVID infection. Patient endorses migraine-like symptoms. No history of migraines. Headache improved, s/p As needed Fioricet # Hyperlipidemia, PTA statin # Hypothyroidism, PTA Synthroid # Obstructive sleep apnea, Nightly CPAP # Atrial fibrillation with controlled ventricular response, Does not appear to be on any rate control agents. Currently rate controlled at time of my evaluation. Continue home Eliquis for VTE prophylaxis, follow with PCP and cardiology as an outpatient. # Chronic pain, Ultram and Lyrica # Depression, PTA Paxil Body mass index is 49.78 kg/m.  Nutrition Interventions:  - Patient was instructed, not to drive, operate heavy machinery, perform activities at heights, swimming or participation in water activities or provide baby sitting services while on Pain, Sleep and Anxiety Medications; until her outpatient Physician has advised to do so again.  - Also recommended to not to take more than prescribed Pain, Sleep and Anxiety  Medications. Patient was ambulatory without any assistance. Patient was seen by physical therapy, who recommended no needs On the day of the discharge the patient's vitals were stable, and no other acute  medical condition were reported by patient. the patient was felt safe to be discharge at Home.  Consultants: None Procedures: None  Discharge Exam: General: Appear in no distress, no Rash; Oral Mucosa Clear, moist. Cardiovascular: S1 and S2 Present, no Murmur, Respiratory: normal respiratory effort, Bilateral Air entry present and mild Crackles, mild wheezes Abdomen: Bowel Sound present, Soft and no tenderness, no hernia Extremities: no Pedal edema, no calf tenderness Neurology: alert and oriented to time, place, and person affect appropriate.  Filed Weights   10/08/20 1550  Weight: 131.5 kg   Vitals:   10/14/20 0310 10/14/20 0754  BP: (!) 155/90 (!) 142/93  Pulse: 60 (!) 48  Resp: 19 16  Temp: 97.7 F (36.5 C) 97.9 F (36.6 C)  SpO2: 94% 94%    DISCHARGE MEDICATION: Allergies as of 10/14/2020       Reactions   Codeine Hives, Itching, Rash   Other Itching, Other (See Comments)   States antibiotic in the past caused itching but can not remember name        Medication List     STOP taking these medications    baclofen 10 MG tablet Commonly known as: LIORESAL   doxepin 25 MG capsule Commonly known as: SINEQUAN   esomeprazole 40 MG capsule Commonly known as: NEXIUM   gabapentin 300 MG capsule Commonly known as: NEURONTIN   levofloxacin 500 MG tablet Commonly known as: Levaquin   methotrexate 2.5 MG tablet Commonly known as: RHEUMATREX   montelukast 10 MG tablet Commonly known as: SINGULAIR   predniSONE 50 MG tablet Commonly known as: DELTASONE Replaced by: predniSONE 10 MG (21) Tbpk tablet   promethazine-phenylephrine 6.25-5 MG/5ML Syrp Commonly known as: Promethazine VC Plain       TAKE these medications    albuterol 108 (90 Base) MCG/ACT inhaler Commonly known as: VENTOLIN HFA Inhale 2 puffs into the lungs every 6 (six) hours as needed for wheezing or shortness of breath.   apixaban 5 MG Tabs tablet Commonly known as: ELIQUIS Take 5 mg  by mouth 2 (two) times daily.   atorvastatin 20 MG tablet Commonly known as: LIPITOR Take 20 mg by mouth every evening.   azelastine 0.1 % nasal spray Commonly known as: ASTELIN Place 2 sprays into both nostrils 2 (two) times daily as needed for rhinitis or allergies.   benzonatate 100 MG capsule Commonly known as: Tessalon Perles Take 1 capsule (100 mg total) by mouth every 6 (six) hours as needed for up to 15 days for cough.   budesonide-formoterol 160-4.5 MCG/ACT inhaler Commonly known as: SYMBICORT Inhale 2 puffs into the lungs 2 (two) times daily.   butalbital-acetaminophen-caffeine 50-325-40 MG tablet Commonly known as: FIORICET Take 1 tablet by mouth every 6 (six) hours as needed for headache.   folic acid 1 MG tablet Commonly known as: FOLVITE Take 1 mg by mouth daily.   furosemide 20 MG tablet Commonly known as: LASIX Take 20 mg by mouth daily as needed for fluid.   guaiFENesin 600 MG 12 hr tablet Commonly known as: MUCINEX Take 1 tablet (600 mg total) by mouth 2 (two) times daily for 7 days.   guaiFENesin-dextromethorphan 100-10 MG/5ML syrup Commonly known as: ROBITUSSIN DM Take 5 mLs by mouth every 4 (four) hours as needed for cough.   levothyroxine 50 MCG  tablet Commonly known as: SYNTHROID Take 50 mcg by mouth daily before breakfast.   ondansetron 4 MG disintegrating tablet Commonly known as: ZOFRAN-ODT Take 1 tablet (4 mg total) by mouth every 8 (eight) hours as needed for nausea or vomiting.   PARoxetine 20 MG tablet Commonly known as: PAXIL Take 20 mg by mouth daily.   predniSONE 10 MG (21) Tbpk tablet Commonly known as: STERAPRED UNI-PAK 21 TAB 40 mg pod x 2 days, 30 mg pod x 2 days, 20 mg pod x 2 days, 10 mg pod x 3 days Replaces: predniSONE 50 MG tablet   pregabalin 50 MG capsule Commonly known as: LYRICA Take 50 mg by mouth daily as needed for pain.   ranitidine 150 MG tablet Commonly known as: ZANTAC Take 150 mg by mouth 2 (two) times  daily.   Skyrizi (150 MG Dose) 75 MG/0.83ML Pskt Generic drug: Risankizumab-rzaa(150 MG Dose) Inject 1 Dose into the skin every 3 (three) months.   traMADol 50 MG tablet Commonly known as: Ultram Take 2 tablets (100 mg total) by mouth every 6 (six) hours as needed for moderate pain or severe pain.   triamcinolone ointment 0.1 % Commonly known as: KENALOG Apply 1 application topically as directed.       Allergies  Allergen Reactions   Codeine Hives, Itching and Rash   Other Itching and Other (See Comments)    States antibiotic in the past caused itching but can not remember name     The results of significant diagnostics from this hospitalization (including imaging, microbiology, ancillary and laboratory) are listed below for reference.    Significant Diagnostic Studies: DG Chest 1 View  Result Date: 09/25/2020 CLINICAL DATA:  Cough for 1 week. EXAM: CHEST  1 VIEW COMPARISON:  PA and lateral chest 06/20/2020. FINDINGS: The lungs are clear. Cardiomegaly. No pneumothorax or pleural fluid. No acute or focal bony abnormality. IMPRESSION: Cardiomegaly without acute disease. Electronically Signed   By: Inge Rise M.D.   On: 09/25/2020 13:29   DG Chest 2 View  Result Date: 10/08/2020 CLINICAL DATA:  Cough and shortness of breath. EXAM: CHEST - 2 VIEW COMPARISON:  October 05, 2020 FINDINGS: The heart size and mediastinal contours are within normal limits. Mild calcification of the aortic arch is seen. Both lungs are clear. The visualized skeletal structures are unremarkable. IMPRESSION: No active cardiopulmonary disease. Electronically Signed   By: Virgina Norfolk M.D.   On: 10/08/2020 18:41   DG Chest 2 View  Result Date: 10/05/2020 CLINICAL DATA:  COVID-19 positive, cough. EXAM: CHEST - 2 VIEW COMPARISON:  September 25, 2020. FINDINGS: The heart size and mediastinal contours are within normal limits. Both lungs are clear. The visualized skeletal structures are unremarkable. IMPRESSION:  No active cardiopulmonary disease. Electronically Signed   By: Marijo Conception M.D.   On: 10/05/2020 09:45   DG Lumbar Spine 2-3 Views  Result Date: 09/25/2020 CLINICAL DATA:  Pain EXAM: LUMBAR SPINE - 2-3 VIEW COMPARISON:  Lumbar MRI [place] 31, 2022; lumbar CT [place] 14, 2021 FINDINGS: Frontal, lateral, and spot lumbosacral lateral images were obtained. There are 5 nonrib bearing lumbar type vertebral bodies. There is a transitional lumbosacral vertebra, denoted as S1. No fracture or spondylolisthesis. Severe disc space narrowing at L4-5 noted. Mild disc space narrowing at T12-L1 and L1-2. Anterior osteophytes noted L1, L2, L4, L5. No erosive changes. Foci of aortic atherosclerosis noted. 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 No fracture or spondylolisthesis. Aortic Atherosclerosis (  ICD10-I70.0). Electronically Signed   By: Lowella Grip III M.D.   On: 09/25/2020 13:32   DG Shoulder Right  Result Date: 09/25/2020 CLINICAL DATA:  Pain EXAM: RIGHT SHOULDER - 2+ VIEW COMPARISON:  None. FINDINGS: Frontal, oblique, and Y scapular images obtained. No fracture or dislocation. Joint spaces appear normal. No erosive change. Visualized right lung clear. IMPRESSION: No fracture or dislocation.  No appreciable arthropathy. Electronically Signed   By: Lowella Grip III M.D.   On: 09/25/2020 13:29   CT Angio Chest PE W and/or Wo Contrast  Result Date: 10/05/2020 CLINICAL DATA:  Cough.  Chronic lower back pain. EXAM: CT ANGIOGRAPHY CHEST CT ABDOMEN AND PELVIS WITH CONTRAST TECHNIQUE: Multidetector CT imaging of the chest was performed using the standard protocol during bolus administration of intravenous contrast. Multiplanar CT image reconstructions and MIPs were obtained to evaluate the vascular anatomy. Multidetector CT imaging of the abdomen and pelvis was performed using the standard protocol during bolus administration of intravenous contrast. CONTRAST:  1102mL  OMNIPAQUE IOHEXOL 350 MG/ML SOLN COMPARISON:  June 14, 2018.  [place] 14, 2021. FINDINGS: CTA CHEST FINDINGS Cardiovascular: Satisfactory opacification of the pulmonary arteries to the segmental level. No evidence of pulmonary embolism. Normal heart size. No pericardial effusion. Atherosclerosis of thoracic aorta is noted without aneurysm or dissection. Coronary artery calcifications are noted. Mediastinum/Nodes: No enlarged mediastinal, hilar, or axillary lymph nodes. Thyroid gland, trachea, and esophagus demonstrate no significant findings. Lungs/Pleura: No pneumothorax or pleural effusion is noted. 3 mm nodule is noted posteriorly in the right upper lobe best seen on image number 26 of series 6. 4 mm nodule is noted in right lower lobe posteriorly best seen on image number 49 of series 6. Musculoskeletal: No chest wall abnormality. No acute or significant osseous findings. Review of the MIP images confirms the above findings. CT ABDOMEN and PELVIS FINDINGS Hepatobiliary: No focal liver abnormality is seen. No gallstones, gallbladder wall thickening, or biliary dilatation. Pancreas: Unremarkable. No pancreatic ductal dilatation or surrounding inflammatory changes. Spleen: Normal in size without focal abnormality. Adrenals/Urinary Tract: Stable left adrenal adenoma. Bilateral renal cysts are noted. No hydronephrosis or renal obstruction is noted. No renal or ureteral calculi are noted. Urinary bladder is unremarkable. Stomach/Bowel: Stomach is within normal limits. Appendix appears normal. No evidence of bowel wall thickening, distention, or inflammatory changes. Vascular/Lymphatic: Aortic atherosclerosis. No enlarged abdominal or pelvic lymph nodes. Reproductive: Uterus and bilateral adnexa are unremarkable. Other: No abdominal wall hernia or abnormality. No abdominopelvic ascites. Musculoskeletal: No acute or significant osseous findings. Review of the MIP images confirms the above findings. IMPRESSION: No definite  evidence of pulmonary embolus. Coronary artery calcifications are noted. Two right-sided pulmonary nodules are noted, the largest measuring 4 mm. No follow-up needed if patient is low-risk (and has no known or suspected primary neoplasm). Non-contrast chest CT can be considered in 12 months if patient is high-risk. This recommendation follows the consensus statement: Guidelines for Management of Incidental Pulmonary Nodules Detected on CT Images: From the Fleischner Society 2017; Radiology 2017; 284:228-243. No acute abnormality seen in the abdomen or pelvis. Aortic Atherosclerosis (ICD10-I70.0). Electronically Signed   By: Marijo Conception M.D.   On: 10/05/2020 12:14   CT ABDOMEN PELVIS W CONTRAST  Result Date: 10/05/2020 CLINICAL DATA:  Cough.  Chronic lower back pain. EXAM: CT ANGIOGRAPHY CHEST CT ABDOMEN AND PELVIS WITH CONTRAST TECHNIQUE: Multidetector CT imaging of the chest was performed using the standard protocol during bolus administration of intravenous contrast. Multiplanar CT image reconstructions and  MIPs were obtained to evaluate the vascular anatomy. Multidetector CT imaging of the abdomen and pelvis was performed using the standard protocol during bolus administration of intravenous contrast. CONTRAST:  167mL OMNIPAQUE IOHEXOL 350 MG/ML SOLN COMPARISON:  June 14, 2018.  [place] 14, 2021. FINDINGS: CTA CHEST FINDINGS Cardiovascular: Satisfactory opacification of the pulmonary arteries to the segmental level. No evidence of pulmonary embolism. Normal heart size. No pericardial effusion. Atherosclerosis of thoracic aorta is noted without aneurysm or dissection. Coronary artery calcifications are noted. Mediastinum/Nodes: No enlarged mediastinal, hilar, or axillary lymph nodes. Thyroid gland, trachea, and esophagus demonstrate no significant findings. Lungs/Pleura: No pneumothorax or pleural effusion is noted. 3 mm nodule is noted posteriorly in the right upper lobe best seen on image number 26 of series  6. 4 mm nodule is noted in right lower lobe posteriorly best seen on image number 49 of series 6. Musculoskeletal: No chest wall abnormality. No acute or significant osseous findings. Review of the MIP images confirms the above findings. CT ABDOMEN and PELVIS FINDINGS Hepatobiliary: No focal liver abnormality is seen. No gallstones, gallbladder wall thickening, or biliary dilatation. Pancreas: Unremarkable. No pancreatic ductal dilatation or surrounding inflammatory changes. Spleen: Normal in size without focal abnormality. Adrenals/Urinary Tract: Stable left adrenal adenoma. Bilateral renal cysts are noted. No hydronephrosis or renal obstruction is noted. No renal or ureteral calculi are noted. Urinary bladder is unremarkable. Stomach/Bowel: Stomach is within normal limits. Appendix appears normal. No evidence of bowel wall thickening, distention, or inflammatory changes. Vascular/Lymphatic: Aortic atherosclerosis. No enlarged abdominal or pelvic lymph nodes. Reproductive: Uterus and bilateral adnexa are unremarkable. Other: No abdominal wall hernia or abnormality. No abdominopelvic ascites. Musculoskeletal: No acute or significant osseous findings. Review of the MIP images confirms the above findings. IMPRESSION: No definite evidence of pulmonary embolus. Coronary artery calcifications are noted. Two right-sided pulmonary nodules are noted, the largest measuring 4 mm. No follow-up needed if patient is low-risk (and has no known or suspected primary neoplasm). Non-contrast chest CT can be considered in 12 months if patient is high-risk. This recommendation follows the consensus statement: Guidelines for Management of Incidental Pulmonary Nodules Detected on CT Images: From the Fleischner Society 2017; Radiology 2017; 284:228-243. No acute abnormality seen in the abdomen or pelvis. Aortic Atherosclerosis (ICD10-I70.0). Electronically Signed   By: Marijo Conception M.D.   On: 10/05/2020 12:14    Microbiology: Recent  Results (from the past 240 hour(s))  Resp Panel by RT-PCR (Flu A&B, Covid) Nasopharyngeal Swab     Status: Abnormal   Collection Time: 10/08/20  7:55 PM   Specimen: Nasopharyngeal Swab; Nasopharyngeal(NP) swabs in vial transport medium  Result Value Ref Range Status   SARS Coronavirus 2 by RT PCR POSITIVE (A) NEGATIVE Final    Comment: RESULT CALLED TO, READ BACK BY AND VERIFIED WITH: ROBIN REGISTER @2103  ON 10/08/20 SKL (NOTE) SARS-CoV-2 target nucleic acids are DETECTED.  The SARS-CoV-2 RNA is generally detectable in upper respiratory specimens during the acute phase of infection. Positive results are indicative of the presence of the identified virus, but do not rule out bacterial infection or co-infection with other pathogens not detected by the test. Clinical correlation with patient history and other diagnostic information is necessary to determine patient infection status. The expected result is Negative.  Fact Sheet for Patients: EntrepreneurPulse.com.au  Fact Sheet for Healthcare Providers: IncredibleEmployment.be  This test is not yet approved or cleared by the [place] FDA and  has been authorized for detection and/or diagnosis of SARS-CoV-2 by FDA  under an Emergency Use Authorization (EUA).  This EUA will remain in effect (meaning this test can  be used) for the duration of  the COVID-19 declaration under Section 564(b)(1) of the Act, 21 U.S.C. section 360bbb-3(b)(1), unless the authorization is terminated or revoked sooner.     Influenza A by PCR NEGATIVE NEGATIVE Final   Influenza B by PCR NEGATIVE NEGATIVE Final    Comment: (NOTE) The Xpert Xpress SARS-CoV-2/FLU/RSV plus assay is intended as an aid in the diagnosis of influenza from Nasopharyngeal swab specimens and should not be used as a sole basis for treatment. Nasal washings and aspirates are unacceptable for Xpert Xpress SARS-CoV-2/FLU/RSV testing.  Fact Sheet for  Patients: EntrepreneurPulse.com.au  Fact Sheet for Healthcare Providers: IncredibleEmployment.be  This test is not yet approved or cleared by the [place] FDA and has been authorized for detection and/or diagnosis of SARS-CoV-2 by FDA under an Emergency Use Authorization (EUA). This EUA will remain in effect (meaning this test can be used) for the duration of the COVID-19 declaration under Section 564(b)(1) of the Act, 21 U.S.C. section 360bbb-3(b)(1), unless the authorization is terminated or revoked.  Performed at [facility], [address] [place], [place] [address]   Culture, blood (Routine X 2) w Reflex to ID Panel     Status: None   Collection Time: 10/08/20  8:30 PM   Specimen: BLOOD  Result Value Ref Range Status   Specimen Description BLOOD BLOOD LEFT HAND  Final   Special Requests   Final    BOTTLES DRAWN AEROBIC AND ANAEROBIC Blood Culture adequate volume   Culture   Final    NO GROWTH 5 DAYS Performed at [facility], [address] [place], [place] [address]    Report Status 10/13/2020 FINAL  Final  Culture, blood (Routine X 2) w Reflex to ID Panel     Status: None   Collection Time: 10/08/20  8:43 PM   Specimen: BLOOD  Result Value Ref Range Status   Specimen Description BLOOD BLOOD RIGHT HAND  Final   Special Requests   Final    BOTTLES DRAWN AEROBIC AND ANAEROBIC Blood Culture adequate volume   Culture   Final    NO GROWTH 5 DAYS Performed at [facility], [address] [place], [place] [address]    Report Status 10/13/2020 FINAL  Final     Labs: CBC: Recent Labs  Lab 10/08/20 1551 10/09/20 0701 10/11/20 0732 10/13/20 0900  WBC 8.8 7.9 11.8* 10.3  NEUTROABS  --   --  9.1* 9.1*  HGB 14.9 14.2 13.5 14.8  HCT 44.6 41.8 41.4 43.8  MCV 84.2 81.2 82.5 81.6  PLT 285 266 264 440   Basic Metabolic Panel: Recent Labs  Lab 10/08/20 1551 10/09/20 0701 10/11/20 0732  10/13/20 0900  NA 137 135 136 135  K 4.4 4.5 3.8 3.9  CL 105 103 103 99  CO2 24 21* 27 26  GLUCOSE 183* 224* 145* 252*  BUN 24* 20 23 29*  CREATININE 0.83 0.75 1.04* 1.10*  CALCIUM 8.9 8.7* 8.9 9.2   Liver Function Tests: No results for input(s): AST, ALT, ALKPHOS, BILITOT, PROT, ALBUMIN in the last 168 hours. No results for input(s): LIPASE, AMYLASE in the last 168 hours. No results for input(s): AMMONIA in the last 168 hours. Cardiac Enzymes: No results for input(s): CKTOTAL, CKMB, CKMBINDEX, TROPONINI in the last 168 hours. BNP (last 3 results) Recent Labs    10/05/20 0920 10/08/20 1551  BNP  316.9* 187.4*   CBG: No results for input(s): GLUCAP in the last 168 hours.  Time spent: 35 minutes  Signed:  Val Riles  [facility]  10/14/2020 1:06 PM

## 2020-10-26 DIAGNOSIS — J45909 Unspecified asthma, uncomplicated: Secondary | ICD-10-CM | POA: Diagnosis not present

## 2020-11-03 DIAGNOSIS — G4733 Obstructive sleep apnea (adult) (pediatric): Secondary | ICD-10-CM | POA: Diagnosis not present

## 2020-11-03 DIAGNOSIS — R5383 Other fatigue: Secondary | ICD-10-CM | POA: Diagnosis not present

## 2020-11-03 DIAGNOSIS — U071 COVID-19: Secondary | ICD-10-CM | POA: Diagnosis not present

## 2020-11-03 DIAGNOSIS — E559 Vitamin D deficiency, unspecified: Secondary | ICD-10-CM | POA: Diagnosis not present

## 2020-11-03 DIAGNOSIS — E669 Obesity, unspecified: Secondary | ICD-10-CM | POA: Diagnosis not present

## 2020-11-03 DIAGNOSIS — R0600 Dyspnea, unspecified: Secondary | ICD-10-CM | POA: Diagnosis not present

## 2020-11-03 DIAGNOSIS — U099 Post covid-19 condition, unspecified: Secondary | ICD-10-CM | POA: Diagnosis not present

## 2020-11-03 DIAGNOSIS — E782 Mixed hyperlipidemia: Secondary | ICD-10-CM | POA: Diagnosis not present

## 2020-11-03 DIAGNOSIS — I10 Essential (primary) hypertension: Secondary | ICD-10-CM | POA: Diagnosis not present

## 2020-11-19 DIAGNOSIS — Z6841 Body Mass Index (BMI) 40.0 and over, adult: Secondary | ICD-10-CM | POA: Diagnosis not present

## 2020-11-19 DIAGNOSIS — M19232 Secondary osteoarthritis, left wrist: Secondary | ICD-10-CM | POA: Diagnosis not present

## 2020-11-19 DIAGNOSIS — M25532 Pain in left wrist: Secondary | ICD-10-CM | POA: Diagnosis not present

## 2020-11-19 DIAGNOSIS — M1812 Unilateral primary osteoarthritis of first carpometacarpal joint, left hand: Secondary | ICD-10-CM | POA: Diagnosis not present

## 2020-11-25 DIAGNOSIS — M25511 Pain in right shoulder: Secondary | ICD-10-CM | POA: Diagnosis not present

## 2020-11-25 DIAGNOSIS — M7541 Impingement syndrome of right shoulder: Secondary | ICD-10-CM | POA: Diagnosis not present

## 2020-11-25 DIAGNOSIS — M778 Other enthesopathies, not elsewhere classified: Secondary | ICD-10-CM | POA: Diagnosis not present

## 2020-11-25 DIAGNOSIS — M7551 Bursitis of right shoulder: Secondary | ICD-10-CM | POA: Diagnosis not present

## 2020-11-25 DIAGNOSIS — G8929 Other chronic pain: Secondary | ICD-10-CM | POA: Diagnosis not present

## 2020-11-25 DIAGNOSIS — M62838 Other muscle spasm: Secondary | ICD-10-CM | POA: Diagnosis not present

## 2020-11-26 DIAGNOSIS — J45909 Unspecified asthma, uncomplicated: Secondary | ICD-10-CM | POA: Diagnosis not present

## 2020-12-01 DIAGNOSIS — Z20822 Contact with and (suspected) exposure to covid-19: Secondary | ICD-10-CM | POA: Diagnosis not present

## 2020-12-01 DIAGNOSIS — U071 COVID-19: Secondary | ICD-10-CM | POA: Diagnosis not present

## 2020-12-15 DIAGNOSIS — R079 Chest pain, unspecified: Secondary | ICD-10-CM | POA: Diagnosis not present

## 2020-12-15 DIAGNOSIS — J189 Pneumonia, unspecified organism: Secondary | ICD-10-CM | POA: Diagnosis not present

## 2020-12-15 DIAGNOSIS — U071 COVID-19: Secondary | ICD-10-CM | POA: Diagnosis not present

## 2020-12-15 DIAGNOSIS — I2510 Atherosclerotic heart disease of native coronary artery without angina pectoris: Secondary | ICD-10-CM | POA: Diagnosis not present

## 2020-12-15 DIAGNOSIS — U099 Post covid-19 condition, unspecified: Secondary | ICD-10-CM | POA: Diagnosis not present

## 2020-12-15 DIAGNOSIS — I4891 Unspecified atrial fibrillation: Secondary | ICD-10-CM | POA: Diagnosis not present

## 2020-12-15 DIAGNOSIS — E782 Mixed hyperlipidemia: Secondary | ICD-10-CM | POA: Diagnosis not present

## 2020-12-15 DIAGNOSIS — R0600 Dyspnea, unspecified: Secondary | ICD-10-CM | POA: Diagnosis not present

## 2020-12-15 DIAGNOSIS — E669 Obesity, unspecified: Secondary | ICD-10-CM | POA: Diagnosis not present

## 2020-12-15 DIAGNOSIS — G933 Postviral fatigue syndrome: Secondary | ICD-10-CM | POA: Diagnosis not present

## 2020-12-15 DIAGNOSIS — G4489 Other headache syndrome: Secondary | ICD-10-CM | POA: Diagnosis not present

## 2020-12-15 DIAGNOSIS — G4733 Obstructive sleep apnea (adult) (pediatric): Secondary | ICD-10-CM | POA: Diagnosis not present

## 2020-12-15 DIAGNOSIS — G3184 Mild cognitive impairment, so stated: Secondary | ICD-10-CM | POA: Diagnosis not present

## 2020-12-21 DIAGNOSIS — Z20822 Contact with and (suspected) exposure to covid-19: Secondary | ICD-10-CM | POA: Diagnosis not present

## 2020-12-27 DIAGNOSIS — J45909 Unspecified asthma, uncomplicated: Secondary | ICD-10-CM | POA: Diagnosis not present

## 2020-12-31 ENCOUNTER — Encounter: Payer: Self-pay | Admitting: Internal Medicine

## 2020-12-31 ENCOUNTER — Encounter (INDEPENDENT_AMBULATORY_CARE_PROVIDER_SITE_OTHER): Payer: Self-pay

## 2020-12-31 ENCOUNTER — Other Ambulatory Visit: Payer: Self-pay

## 2020-12-31 ENCOUNTER — Ambulatory Visit (INDEPENDENT_AMBULATORY_CARE_PROVIDER_SITE_OTHER): Payer: Medicare HMO | Admitting: Internal Medicine

## 2020-12-31 VITALS — BP 144/90 | HR 65 | Temp 97.9°F | Resp 16 | Ht 64.5 in | Wt 306.0 lb

## 2020-12-31 DIAGNOSIS — G4733 Obstructive sleep apnea (adult) (pediatric): Secondary | ICD-10-CM | POA: Diagnosis not present

## 2020-12-31 DIAGNOSIS — J449 Chronic obstructive pulmonary disease, unspecified: Secondary | ICD-10-CM

## 2020-12-31 DIAGNOSIS — Z7189 Other specified counseling: Secondary | ICD-10-CM | POA: Diagnosis not present

## 2020-12-31 DIAGNOSIS — R0602 Shortness of breath: Secondary | ICD-10-CM | POA: Diagnosis not present

## 2020-12-31 DIAGNOSIS — Z8616 Personal history of COVID-19: Secondary | ICD-10-CM | POA: Diagnosis not present

## 2020-12-31 DIAGNOSIS — U099 Post covid-19 condition, unspecified: Secondary | ICD-10-CM

## 2020-12-31 NOTE — Patient Instructions (Signed)
Sleep Apnea Sleep apnea affects breathing during sleep. It causes breathing to stop for 10 seconds or more, or to become shallow. People with sleep apnea usually snore loudly. It can also increase the risk of: Heart attack. Stroke. Being very overweight (obese). Diabetes. Heart failure. Irregular heartbeat. High blood pressure. The goal of treatment is to help you breathe normally again. What are the causes? The most common cause of this condition is a collapsed or blocked airway. There are three kinds of sleep apnea: Obstructive sleep apnea. This is caused by a blocked or collapsed airway. Central sleep apnea. This happens when the brain does not send the right signals to the muscles that control breathing. Mixed sleep apnea. This is a combination of obstructive and central sleep apnea. What increases the risk? Being overweight. Smoking. Having a small airway. Being older. Being female. Drinking alcohol. Taking medicines to calm yourself (sedatives or tranquilizers). Having family members with the condition. Having a tongue or tonsils that are larger than normal. What are the signs or symptoms? Trouble staying asleep. Loud snoring. Headaches in the morning. Waking up gasping. Dry mouth or sore throat in the morning. Being sleepy or tired during the day. If you are sleepy or tired during the day, you Witz also: Not be able to focus your mind (concentrate). Forget things. Get angry a lot and have mood swings. Feel sad (depressed). Have changes in your personality. Have less interest in sex, if you are female. Be unable to have an erection, if you are female. How is this treated?  Sleeping on your side. Using a medicine to get rid of mucus in your nose (decongestant). Avoiding the use of alcohol, medicines to help you relax, or certain pain medicines (narcotics). Losing weight, if needed. Changing your diet. Quitting smoking. Using a machine to open your airway while you  sleep, such as: An oral appliance. This is a mouthpiece that shifts your lower jaw forward. A CPAP device. This device blows air through a mask when you breathe out (exhale). An EPAP device. This has valves that you put in each nostril. A BPAP device. This device blows air through a mask when you breathe in (inhale) and breathe out. Having surgery if other treatments do not work. Follow these instructions at home: Lifestyle Make changes that your doctor recommends. Eat a healthy diet. Lose weight if needed. Avoid alcohol, medicines to help you relax, and some pain medicines. Do not smoke or use any products that contain nicotine or tobacco. If you need help quitting, ask your doctor. General instructions Take over-the-counter and prescription medicines only as told by your doctor. If you were given a machine to use while you sleep, use it only as told by your doctor. If you are having surgery, make sure to tell your doctor you have sleep apnea. You Drahos need to bring your device with you. Keep all follow-up visits. Contact a doctor if: The machine that you were given to use during sleep bothers you or does not seem to be working. You do not get better. You get worse. Get help right away if: Your chest hurts. You have trouble breathing in enough air. You have an uncomfortable feeling in your back, arms, or stomach. You have trouble talking. One side of your body feels weak. A part of your face is hanging down. These symptoms Crombie be an emergency. Get help right away. Call your local emergency services (911 in the [place] Do not wait to see if the symptoms  will go away. Do not drive yourself to the hospital. Summary This condition affects breathing during sleep. The most common cause is a collapsed or blocked airway. The goal of treatment is to help you breathe normally while you sleep. This information is not intended to replace advice given to you by your health care provider. Make  sure you discuss any questions you have with your health care provider. Document Revised: 03/06/2020 Document Reviewed: 03/06/2020 Else[employer] Patient Education  2022 [employer]. Chronic Obstructive Pulmonary Disease Chronic obstructive pulmonary disease (COPD) is a long-term (chronic) lung problem. When you have COPD, it is hard for air to get in and out of your lungs. Usually the condition gets worse over time, and your lungs will never return to normal. There are things you can do to keep yourself as healthy as possible. What are the causes? Smoking. This is the most common cause. Certain genes passed from parent to child (inherited). What increases the risk? Being exposed to secondhand smoke from cigarettes, pipes, or cigars. Being exposed to chemicals and other irritants, such as fumes and dust in the work environment. Having chronic lung conditions or infections. What are the signs or symptoms? Shortness of breath, especially during physical activity. A long-term cough with a large amount of thick mucus. Sometimes, the cough Gentles not have any mucus (dry cough). Wheezing. Breathing quickly. Skin that looks gray or blue, especially in the fingers, toes, or lips. Feeling tired (fatigue). Weight loss. Chest tightness. Having infections often. Episodes when breathing symptoms become much worse (exacerbations). At the later stages of this disease, you Kressin have swelling in the ankles, feet, or legs. How is this treated? Taking medicines. Quitting smoking, if you smoke. Rehabilitation. This includes steps to make your body work better. It Hank[place] involve a team of specialists. Doing exercises. Making changes to your diet. Using oxygen. Lung surgery. Lung transplant. Comfort measures (palliative care). Follow these instructions at home: Medicines Take over-the-counter and prescription medicines only as told by your doctor. Talk to your doctor before taking any cough or allergy  medicines. You Nease need to avoid medicines that cause your lungs to be dry. Lifestyle If you smoke, stop smoking. Smoking makes the problem worse. Do not smoke or use any products that contain nicotine or tobacco. If you need help quitting, ask your doctor. Avoid being around things that make your breathing worse. This Mclees include smoke, chemicals, and fumes. Stay active, but remember to rest as well. Learn and use tips on how to manage stress and control your breathing. Make sure you get enough sleep. Most adults need at least 7 hours of sleep every night. Eat healthy foods. Eat smaller meals more often. Rest before meals. Controlled breathing Learn and use tips on how to control your breathing as told by your doctor. Try: Breathing in (inhaling) through your nose for 1 second. Then, pucker your lips and breath out (exhale) through your lips for 2 seconds. Putting one hand on your belly (abdomen). Breathe in slowly through your nose for 1 second. Your hand on your belly should move out. Pucker your lips and breathe out slowly through your lips. Your hand on your belly should move in as you breathe out.  Controlled coughing Learn and use controlled coughing to clear mucus from your lungs. Follow these steps: Lean your head a little forward. Breathe in deeply. Try to hold your breath for 3 seconds. Keep your mouth slightly open while coughing 2 times. Spit any mucus out into  a tissue. Rest and do the steps again 1 or 2 times as needed. General instructions Make sure you get all the shots (vaccines) that your doctor recommends. Ask your doctor about a flu shot and a pneumonia shot. Use oxygen therapy and pulmonary rehabilitation if told by your doctor. If you need home oxygen therapy, ask your doctor if you should buy a tool to measure your oxygen level (oximeter). Make a COPD action plan with your doctor. This helps you to know what to do if you feel worse than usual. Manage any other  conditions you have as told by your doctor. Avoid going outside when it is very hot, cold, or humid. Avoid people who have a sickness you can catch (contagious). Keep all follow-up visits. Contact a doctor if: You cough up more mucus than usual. There is a change in the color or thickness of the mucus. It is harder to breathe than usual. Your breathing is faster than usual. You have trouble sleeping. You need to use your medicines more often than usual. You have trouble doing your normal activities such as getting dressed or walking around the house. Get help right away if: You have shortness of breath while resting. You have shortness of breath that stops you from: Being able to talk. Doing normal activities. Your chest hurts for longer than 5 minutes. Your skin color is more blue than usual. Your pulse oximeter shows that you have low oxygen for longer than 5 minutes. You have a fever. You feel too tired to breathe normally. These symptoms [place] represent a serious problem that is an emergency. Do not wait to see if the symptoms will go away. Get medical help right away. Call your local emergency services (911 in the [place] Do not drive yourself to the hospital. Summary Chronic obstructive pulmonary disease (COPD) is a long-term lung problem. The way your lungs work will never return to normal. Usually the condition gets worse over time. There are things you can do to keep yourself as healthy as possible. Take over-the-counter and prescription medicines only as told by your doctor. If you smoke, stop. Smoking makes the problem worse. This information is not intended to replace advice given to you by your health care provider. Make sure you discuss any questions you have with your health care provider. Document Revised: 02/04/2020 Document Reviewed: 02/04/2020 Elsevier Patient Education  2022 [employer].

## 2020-12-31 NOTE — Progress Notes (Signed)
[facility] [place], [place] [address]  Pulmonary Sleep Medicine   Office Visit Note  Patient Name: Patricia Walker DOB: [birth year] MRN [id number]  Date of Service: 12/31/2020  Complaints/HPI: She had Covid back in July. She staets that since the covid she has been having some breathing issues. She had a second bout of covid in August she was not admitted for this time. She also has a history COPD also and has been on inhalers. She states that this is not a confirmed diagnosis and so she wants a definitive diagnosis. Patient also does have sleep apnea. She states that she has a CPAP machine but has not been using the device. Patient states she feels somewhat clausterphobic.  I did discuss with her the importance of being compliant with PAP therapy and the inherent cardiovascular risks that can be a problem with long-term sleep apnea that is untreated.  Patient also has chronic pain for which she takes meds not sure what she takes for the chronic pain.  ROS  General: (-) fever, (-) chills, (-) night sweats, (-) weakness Skin: (-) rashes, (-) itching,. Eyes: (-) visual changes, (-) redness, (-) itching. Nose and Sinuses: (-) nasal stuffiness or itchiness, (-) postnasal drip, (-) nosebleeds, (-) sinus trouble. Mouth and Throat: (-) sore throat, (-) hoarseness. Neck: (-) swollen glands, (-) enlarged thyroid, (-) neck pain. Respiratory: + cough, (-) bloody sputum, + shortness of breath, - wheezing. Cardiovascular: + ankle swelling, (-) chest pain. Lymphatic: (-) lymph node enlargement. Neurologic: (-) numbness, (-) tingling. Psychiatric: (-) anxiety, (-) depression   Current Medication: Outpatient Encounter Medications as of 12/31/2020  Medication Sig Note   Adalimumab (HUMIRA PEN) 40 MG/0.4ML PNKT Inject into the skin.    albuterol (VENTOLIN HFA) 108 (90 Base) MCG/ACT inhaler Inhale 2 puffs into the lungs every 6 (six) hours as needed for wheezing or shortness of  breath.    apixaban (ELIQUIS) 5 MG TABS tablet Take 5 mg by mouth 2 (two) times daily.    atorvastatin (LIPITOR) 20 MG tablet Take 20 mg by mouth every evening.    azelastine (ASTELIN) 0.1 % nasal spray Place 2 sprays into both nostrils 2 (two) times daily as needed for rhinitis or allergies.  03/26/2019: PRN   budesonide-formoterol (SYMBICORT) 160-4.5 MCG/ACT inhaler Inhale 2 puffs into the lungs 2 (two) times daily.    butalbital-acetaminophen-caffeine (FIORICET) 50-325-40 MG tablet Take 1 tablet by mouth every 6 (six) hours as needed for headache.    folic acid (FOLVITE) 1 MG tablet Take 1 mg by mouth daily.    furosemide (LASIX) 20 MG tablet Take 20 mg by mouth daily as needed for fluid.    guaiFENesin-dextromethorphan (ROBITUSSIN DM) 100-10 MG/5ML syrup Take 5 mLs by mouth every 4 (four) hours as needed for cough.    levothyroxine (SYNTHROID, LEVOTHROID) 50 MCG tablet Take 50 mcg by mouth daily before breakfast.    ondansetron (ZOFRAN-ODT) 4 MG disintegrating tablet Take 1 tablet (4 mg total) by mouth every 8 (eight) hours as needed for nausea or vomiting.    PARoxetine (PAXIL) 20 MG tablet Take 20 mg by mouth daily.    predniSONE (STERAPRED UNI-PAK 21 TAB) 10 MG (21) TBPK tablet 40 mg pod x 2 days, 30 mg pod x 2 days, 20 mg pod x 2 days, 10 mg pod x 3 days    pregabalin (LYRICA) 50 MG capsule Take 50 mg by mouth daily as needed for pain.    ranitidine (ZANTAC) 150  MG tablet Take 150 mg by mouth 2 (two) times daily.    SKYRIZI, 150 MG DOSE, 75 MG/0.83ML PSKT Inject 1 Dose into the skin every 3 (three) months.    traMADol (ULTRAM) 50 MG tablet Take 2 tablets (100 mg total) by mouth every 6 (six) hours as needed for moderate pain or severe pain.    triamcinolone ointment (KENALOG) 0.1 % Apply 1 application topically as directed.    No facility-administered encounter medications on file as of 12/31/2020.    Surgical History: Past Surgical History:  Procedure Laterality Date   ARTERY BIOPSY  Right 07/21/2017   Procedure: BIOPSY TEMPORAL ARTERY;  Surgeon: [name], MD;  Location: [facility];  Service: Vascular;  Laterality: Right;   CARDIAC CATHETERIZATION     CARDIOVERSION Right 09/01/2016   Procedure: Cardioversion;  Surgeon: [name], MD;  Location: [facility];  Service: Cardiovascular;  Laterality: Right;   CARDIOVERSION N/A 09/09/2016   Procedure: Cardioversion;  Surgeon: [name], MD;  Location: [facility];  Service: Cardiovascular;  Laterality: N/A;   CATARACT EXTRACTION W/PHACO Right 03/26/2019   Procedure: CATARACT EXTRACTION PHACO AND INTRAOCULAR LENS PLACEMENT (IOC) RIGHT 6.45, 00:39.9;  Surgeon: Birder Robson, MD;  Location: [place];  Service: Ophthalmology;  Laterality: Right;   CATARACT EXTRACTION W/PHACO Left 04/16/2019   Procedure: CATARACT EXTRACTION PHACO AND INTRAOCULAR LENS PLACEMENT (IOC) LEFT;   3.14, 00:24.9;  Surgeon: Birder Robson, MD;  Location: [place];  Service: Ophthalmology;  Laterality: Left;  sleep apnea-CPAP   COLONOSCOPY WITH PROPOFOL N/A 08/28/2019   Procedure: COLONOSCOPY WITH PROPOFOL;  Surgeon: Toledo, Benay Pike, MD;  Location: [facility];  Service: Gastroenterology;  Laterality: N/A;   ELECTROPHYSIOLOGIC STUDY N/A 02/01/2016   Procedure: CARDIOVERSION;  Surgeon: [name], MD;  Location: [facility];  Service: Cardiovascular;  Laterality: N/A;   ESOPHAGEAL DILATION     ESOPHAGOGASTRODUODENOSCOPY (EGD) WITH PROPOFOL N/A 02/06/2017   Procedure: ESOPHAGOGASTRODUODENOSCOPY (EGD) WITH PROPOFOL;  Surgeon: [name], MD;  Location: [facility];  Service: Gastroenterology;  Laterality: N/A;   ESOPHAGOGASTRODUODENOSCOPY (EGD) WITH PROPOFOL N/A 08/28/2019   Procedure: ESOPHAGOGASTRODUODENOSCOPY (EGD) WITH PROPOFOL;  Surgeon: Toledo, Benay Pike, MD;  Location: [facility];  Service: Gastroenterology;  Laterality: N/A;   EUS N/A 02/16/2017   Procedure: FULL UPPER ENDOSCOPIC ULTRASOUND (EUS) RADIAL;   Surgeon: [name], MD;  Location: [facility];  Service: Gastroenterology;  Laterality: N/A;   LEFT HEART CATH AND CORONARY ANGIOGRAPHY N/A 09/08/2016   Procedure: Left Heart Cath and Coronary Angiography;  Surgeon: [name], MD;  Location: [place] [facility];  Service: Cardiovascular;  Laterality: N/A;   US ECHOCARDIOGRAPHY      Medical History: Past Medical History:  Diagnosis Date   Anxiety    Arthritis    Asthma    Coronary artery disease    mild, nonobstructive   Depression    Dyspnea    on exertion   Dysrhythmia    Atrial Fibrillation   GERD (gastroesophageal reflux disease)    Heart murmur    Hyperlipidemia    Hypertension    Hypothyroidism    MI, old 2016   nonobstructive CAD by Cath   Morbid obesity ([place])    Persistent atrial fibrillation (HCC)    Pneumonia 06/2018   and RSV   Psoriasis    Sleep apnea    compliant with CPAP   Thyroid disease     Family History: Family History  Problem Relation Age of Onset  Breast cancer Paternal Aunt    Other Father    Heart attack Father     Social History: Social History   Socioeconomic History   Marital status: Divorced    Spouse name: Not on file   Number of children: Not on file   Years of education: Not on file   Highest education level: Not on file  Occupational History   Not on file  Tobacco Use   Smoking status: Former    Types: Cigarettes    Quit date: 02/01/2013    Years since quitting: 7.9   Smokeless tobacco: Never  Vaping Use   Vaping Use: Never used  Substance and Sexual Activity   Alcohol use: No   Drug use: No   Sexual activity: Not on file  Other Topics Concern   Not on file  Social History Narrative   Lives in [place] with multiple family members   unemployed   Social Determinants of Health   Financial Resource Strain: Not on file  Food Insecurity: Not on file  Transportation Needs: Not on file  Physical Activity: Not on file  Stress: Not on file   Social Connections: Not on file  Intimate Partner Violence: Not on file    Vital Signs: Blood pressure (!) 144/90, pulse 65, temperature 97.9 F (36.6 C), resp. rate 16, height 5' 4.5" (1.638 m), weight (!) 306 lb (138.8 kg), SpO2 96 %.  Examination: General Appearance: The patient is well-developed, well-nourished, and in no distress. Skin: Gross inspection of skin unremarkable. Head: normocephalic, no gross deformities. Eyes: no gross deformities noted. ENT: ears appear grossly normal no exudates. Neck: Supple. No thyromegaly. No LAD. Respiratory: few rhonchi noted at this time. Cardiovascular: Normal S1 and S2 without murmur or rub. Extremities: No cyanosis. pulses are equal. Neurologic: Alert and oriented. No involuntary movements.  LABS: Recent Results (from the past 2160 hour(s))  Lactic acid, plasma     Status: Abnormal   Collection Time: 10/05/20  9:20 AM  Result Value Ref Range   Lactic Acid, Venous 2.4 (HH) 0.5 - 1.9 mmol/L    Comment: CRITICAL RESULT CALLED TO, READ BACK BY AND VERIFIED WITH STEPHANIE RUDD RN @0953  10/05/20 SCS Performed at [facility], [address] [place], [place] [address]   Comprehensive metabolic panel     Status: Abnormal   Collection Time: 10/05/20  9:20 AM  Result Value Ref Range   Sodium 138 135 - 145 mmol/L   Potassium 4.1 3.5 - 5.1 mmol/L   Chloride 102 98 - 111 mmol/L   CO2 28 22 - 32 mmol/L   Glucose, Bld 160 (H) 70 - 99 mg/dL    Comment: Glucose reference range applies only to samples taken after fasting for at least 8 hours.   BUN 19 8 - 23 mg/dL   Creatinine, Ser 0.99 0.44 - 1.00 mg/dL   Calcium 8.6 (L) 8.9 - 10.3 mg/dL   Total Protein 6.8 6.5 - 8.1 g/dL   Albumin 3.4 (L) 3.5 - 5.0 g/dL   AST 29 15 - 41 U/L   ALT 31 0 - 44 U/L   Alkaline Phosphatase 78 38 - 126 U/L   Total Bilirubin 0.8 0.3 - 1.2 mg/dL   GFR, Estimated 60 (L) >60 mL/min    Comment: (NOTE) Calculated using the CKD-EPI Creatinine Equation  (2021)    Anion gap 8 5 - 15    Comment: Performed at [facility], [address] [place], [place] [address]  CBC  with Differential     Status: Abnormal   Collection Time: 10/05/20  9:20 AM  Result Value Ref Range   WBC 6.8 4.0 - 10.5 K/uL   RBC 5.59 (H) 3.87 - 5.11 MIL/uL   Hemoglobin 15.2 (H) 12.0 - 15.0 g/dL   HCT 46.9 (H) 36.0 - 46.0 %   MCV 83.9 80.0 - 100.0 fL   MCH 27.2 26.0 - 34.0 pg   MCHC 32.4 30.0 - 36.0 g/dL   RDW 13.3 11.5 - 15.5 %   Platelets 335 150 - 400 K/uL   nRBC 0.0 0.0 - 0.2 %   Neutrophils Relative % 73 %   Neutro Abs 5.0 1.7 - 7.7 K/uL   Lymphocytes Relative 17 %   Lymphs Abs 1.2 0.7 - 4.0 K/uL   Monocytes Relative 7 %   Monocytes Absolute 0.5 0.1 - 1.0 K/uL   Eosinophils Relative 2 %   Eosinophils Absolute 0.1 0.0 - 0.5 K/uL   Basophils Relative 0 %   Basophils Absolute 0.0 0.0 - 0.1 K/uL   Immature Granulocytes 1 %   Abs Immature Granulocytes 0.07 0.00 - 0.07 K/uL    Comment: Performed at [facility], [place], [place] [address]  Troponin I (High Sensitivity)     Status: Abnormal   Collection Time: 10/05/20  9:20 AM  Result Value Ref Range   Troponin I (High Sensitivity) 20 (H) <18 ng/L    Comment: (NOTE) Elevated high sensitivity troponin I (hsTnI) values and significant  changes across serial measurements [place] suggest ACS but many other  chronic and acute conditions are known to elevate hsTnI results.  Refer to the "Links" section for chest pain algorithms and additional  guidance. Performed at [facility], [address] [place], [place] [address]   Brain natriuretic peptide     Status: Abnormal   Collection Time: 10/05/20  9:20 AM  Result Value Ref Range   B Natriuretic Peptide 316.9 (H) 0.0 - 100.0 pg/mL    Comment: Performed at [facility], [address] [place], [place] [address]  Urinalysis, Complete w Microscopic     Status: Abnormal   Collection Time: 10/05/20 11:37 AM   Result Value Ref Range   Color, Urine YELLOW (A) YELLOW   APPearance CLEAR (A) CLEAR   Specific Gravity, Urine >1.046 (H) 1.005 - 1.030   pH 5.0 5.0 - 8.0   Glucose, UA NEGATIVE NEGATIVE mg/dL   Hgb urine dipstick NEGATIVE NEGATIVE   Bilirubin Urine NEGATIVE NEGATIVE   Ketones, ur NEGATIVE NEGATIVE mg/dL   Protein, ur NEGATIVE NEGATIVE mg/dL   Nitrite NEGATIVE NEGATIVE   Leukocytes,Ua NEGATIVE NEGATIVE   RBC / HPF 0-5 0 - 5 RBC/hpf   WBC, UA 0-5 0 - 5 WBC/hpf   Bacteria, UA RARE (A) NONE SEEN   Squamous Epithelial / LPF 0-5 0 - 5   Mucus PRESENT     Comment: Performed at [facility], [address] [place], [place] [address]  Lactic acid, plasma     Status: None   Collection Time: 10/05/20 11:38 AM  Result Value Ref Range   Lactic Acid, Venous 1.7 0.5 - 1.9 mmol/L    Comment: Performed at [facility], [address] [place], [place] [address]  Troponin I (High Sensitivity)     Status: Abnormal   Collection Time: 10/05/20  1:52 PM  Result Value Ref Range   Troponin I (High Sensitivity) 20 (H) <18 ng/L  Comment: (NOTE) Elevated high sensitivity troponin I (hsTnI) values and significant  changes across serial measurements [place] suggest ACS but many other  chronic and acute conditions are known to elevate hsTnI results.  Refer to the "Links" section for chest pain algorithms and additional  guidance. Performed at [facility], [address] [place], [place] [address]   Basic metabolic panel     Status: Abnormal   Collection Time: 10/08/20  3:51 PM  Result Value Ref Range   Sodium 137 135 - 145 mmol/L   Potassium 4.4 3.5 - 5.1 mmol/L   Chloride 105 98 - 111 mmol/L   CO2 24 22 - 32 mmol/L   Glucose, Bld 183 (H) 70 - 99 mg/dL    Comment: Glucose reference range applies only to samples taken after fasting for at least 8 hours.   BUN 24 (H) 8 - 23 mg/dL   Creatinine, Ser 0.83 0.44 - 1.00 mg/dL   Calcium 8.9 8.9 - 10.3 mg/dL   GFR, Estimated  >60 >60 mL/min    Comment: (NOTE) Calculated using the CKD-EPI Creatinine Equation (2021)    Anion gap 8 5 - 15    Comment: Performed at [facility], [address] [place], [place] [address]  CBC     Status: Abnormal   Collection Time: 10/08/20  3:51 PM  Result Value Ref Range   WBC 8.8 4.0 - 10.5 K/uL   RBC 5.30 (H) 3.87 - 5.11 MIL/uL   Hemoglobin 14.9 12.0 - 15.0 g/dL   HCT 44.6 36.0 - 46.0 %   MCV 84.2 80.0 - 100.0 fL   MCH 28.1 26.0 - 34.0 pg   MCHC 33.4 30.0 - 36.0 g/dL   RDW 13.7 11.5 - 15.5 %   Platelets 285 150 - 400 K/uL   nRBC 0.0 0.0 - 0.2 %    Comment: Performed at [facility], [address] [place], [place] [address]  Brain natriuretic peptide     Status: Abnormal   Collection Time: 10/08/20  3:51 PM  Result Value Ref Range   B Natriuretic Peptide 187.4 (H) 0.0 - 100.0 pg/mL    Comment: Performed at [facility], [place], [place] [address]  Troponin I (High Sensitivity)     Status: Abnormal   Collection Time: 10/08/20  5:36 PM  Result Value Ref Range   Troponin I (High Sensitivity) 18 (H) <18 ng/L    Comment: (NOTE) Elevated high sensitivity troponin I (hsTnI) values and significant  changes across serial measurements [place] suggest ACS but many other  chronic and acute conditions are known to elevate hsTnI results.  Refer to the "Links" section for chest pain algorithms and additional  guidance. Performed at [facility], [address] [place], [place] [address]   Urinalysis, Complete w Microscopic Urine, Clean Catch     Status: Abnormal   Collection Time: 10/08/20  5:45 PM  Result Value Ref Range   Color, Urine YELLOW (A) YELLOW   APPearance HAZY (A) CLEAR   Specific Gravity, Urine 1.025 1.005 - 1.030   pH 5.0 5.0 - 8.0   Glucose, UA NEGATIVE NEGATIVE mg/dL   Hgb urine dipstick NEGATIVE NEGATIVE   Bilirubin Urine NEGATIVE NEGATIVE   Ketones, ur NEGATIVE NEGATIVE mg/dL   Protein, ur NEGATIVE  NEGATIVE mg/dL   Nitrite NEGATIVE NEGATIVE   Leukocytes,Ua NEGATIVE NEGATIVE   RBC / HPF 0-5 0 - 5 RBC/hpf   WBC, UA 0-5 0 - 5 WBC/hpf  Bacteria, UA NONE SEEN NONE SEEN   Squamous Epithelial / LPF 0-5 0 - 5   Mucus PRESENT     Comment: Performed at [facility], [place], [place] [address]  Troponin I (High Sensitivity)     Status: None   Collection Time: 10/08/20  7:55 PM  Result Value Ref Range   Troponin I (High Sensitivity) 11 <18 ng/L    Comment: (NOTE) Elevated high sensitivity troponin I (hsTnI) values and significant  changes across serial measurements [place] suggest ACS but many other  chronic and acute conditions are known to elevate hsTnI results.  Refer to the "Links" section for chest pain algorithms and additional  guidance. Performed at [facility], [address] [place], [place] [address]   Resp Panel by RT-PCR (Flu A&B, Covid) Nasopharyngeal Swab     Status: Abnormal   Collection Time: 10/08/20  7:55 PM   Specimen: Nasopharyngeal Swab; Nasopharyngeal(NP) swabs in vial transport medium  Result Value Ref Range   SARS Coronavirus 2 by RT PCR POSITIVE (A) NEGATIVE    Comment: RESULT CALLED TO, READ BACK BY AND VERIFIED WITH: ROBIN REGISTER @2103  ON 10/08/20 SKL (NOTE) SARS-CoV-2 target nucleic acids are DETECTED.  The SARS-CoV-2 RNA is generally detectable in upper respiratory specimens during the acute phase of infection. Positive results are indicative of the presence of the identified virus, but do not rule out bacterial infection or co-infection with other pathogens not detected by the test. Clinical correlation with patient history and other diagnostic information is necessary to determine patient infection status. The expected result is Negative.  Fact Sheet for Patients: EntrepreneurPulse.com.au  Fact Sheet for Healthcare Providers: IncredibleEmployment.be  This test is not yet  approved or cleared by the [place] FDA and  has been authorized for detection and/or diagnosis of SARS-CoV-2 by FDA under an Emergency Use Authorization (EUA).  This EUA will remain in effect (meaning this test can  be used) for the duration of  the COVID-19 declaration under Section 564(b)(1) of the Act, 21 U.S.C. section 360bbb-3(b)(1), unless the authorization is terminated or revoked sooner.     Influenza A by PCR NEGATIVE NEGATIVE   Influenza B by PCR NEGATIVE NEGATIVE    Comment: (NOTE) The Xpert Xpress SARS-CoV-2/FLU/RSV plus assay is intended as an aid in the diagnosis of influenza from Nasopharyngeal swab specimens and should not be used as a sole basis for treatment. Nasal washings and aspirates are unacceptable for Xpert Xpress SARS-CoV-2/FLU/RSV testing.  Fact Sheet for Patients: EntrepreneurPulse.com.au  Fact Sheet for Healthcare Providers: IncredibleEmployment.be  This test is not yet approved or cleared by the [place] FDA and has been authorized for detection and/or diagnosis of SARS-CoV-2 by FDA under an Emergency Use Authorization (EUA). This EUA will remain in effect (meaning this test can be used) for the duration of the COVID-19 declaration under Section 564(b)(1) of the Act, 21 U.S.C. section 360bbb-3(b)(1), unless the authorization is terminated or revoked.  Performed at [facility], [address] [place], [place] [address]   Culture, blood (Routine X 2) w Reflex to ID Panel     Status: None   Collection Time: 10/08/20  8:30 PM   Specimen: BLOOD  Result Value Ref Range   Specimen Description BLOOD BLOOD LEFT HAND    Special Requests      BOTTLES DRAWN AEROBIC AND ANAEROBIC Blood Culture adequate volume   Culture      NO GROWTH 5 DAYS Performed at [facility], [place], [place] [address]    Report Status 10/13/2020 FINAL   Culture, blood (Routine X 2) w Reflex to  ID Panel     Status: None   Collection Time: 10/08/20  8:43 PM   Specimen: BLOOD  Result Value Ref Range   Specimen Description BLOOD BLOOD RIGHT HAND    Special Requests      BOTTLES DRAWN AEROBIC AND ANAEROBIC Blood Culture adequate volume   Culture      NO GROWTH 5 DAYS Performed at [facility], [address], [place], [place] [address]    Report Status 10/13/2020 FINAL   Basic metabolic panel     Status: Abnormal   Collection Time: 10/09/20  7:01 AM  Result Value Ref Range   Sodium 135 135 - 145 mmol/L   Potassium 4.5 3.5 - 5.1 mmol/L   Chloride 103 98 - 111 mmol/L   CO2 21 (L) 22 - 32 mmol/L   Glucose, Bld 224 (H) 70 - 99 mg/dL    Comment: Glucose reference range applies only to samples taken after fasting for at least 8 hours.   BUN 20 8 - 23 mg/dL   Creatinine, Ser 0.75 0.44 - 1.00 mg/dL   Calcium 8.7 (L) 8.9 - 10.3 mg/dL   GFR, Estimated >60 >60 mL/min    Comment: (NOTE) Calculated using the CKD-EPI Creatinine Equation (2021)    Anion gap 11 5 - 15    Comment: Performed at [facility], [address] [place], [place] [address]  CBC     Status: Abnormal   Collection Time: 10/09/20  7:01 AM  Result Value Ref Range   WBC 7.9 4.0 - 10.5 K/uL   RBC 5.15 (H) 3.87 - 5.11 MIL/uL   Hemoglobin 14.2 12.0 - 15.0 g/dL   HCT 41.8 36.0 - 46.0 %   MCV 81.2 80.0 - 100.0 fL   MCH 27.6 26.0 - 34.0 pg   MCHC 34.0 30.0 - 36.0 g/dL   RDW 13.7 11.5 - 15.5 %   Platelets 266 150 - 400 K/uL   nRBC 0.0 0.0 - 0.2 %    Comment: Performed at [facility], [address] [place], [place] [address]  CBC with Differential/Platelet     Status: Abnormal   Collection Time: 10/11/20  7:32 AM  Result Value Ref Range   WBC 11.8 (H) 4.0 - 10.5 K/uL   RBC 5.02 3.87 - 5.11 MIL/uL   Hemoglobin 13.5 12.0 - 15.0 g/dL   HCT 41.4 36.0 - 46.0 %   MCV 82.5 80.0 - 100.0 fL   MCH 26.9 26.0 - 34.0 pg   MCHC 32.6 30.0 - 36.0 g/dL   RDW 14.3 11.5 - 15.5 %   Platelets 264  150 - 400 K/uL   nRBC 0.0 0.0 - 0.2 %   Neutrophils Relative % 77 %   Neutro Abs 9.1 (H) 1.7 - 7.7 K/uL   Lymphocytes Relative 14 %   Lymphs Abs 1.6 0.7 - 4.0 K/uL   Monocytes Relative 8 %   Monocytes Absolute 0.9 0.1 - 1.0 K/uL   Eosinophils Relative 0 %   Eosinophils Absolute 0.0 0.0 - 0.5 K/uL   Basophils Relative 0 %   Basophils Absolute 0.0 0.0 - 0.1 K/uL   Immature Granulocytes 1 %   Abs Immature Granulocytes 0.08 (H) 0.00 - 0.07 K/uL    Comment: Performed at [facility], [address], [place], [place] [address]  Basic  metabolic panel     Status: Abnormal   Collection Time: 10/11/20  7:32 AM  Result Value Ref Range   Sodium 136 135 - 145 mmol/L   Potassium 3.8 3.5 - 5.1 mmol/L   Chloride 103 98 - 111 mmol/L   CO2 27 22 - 32 mmol/L   Glucose, Bld 145 (H) 70 - 99 mg/dL    Comment: Glucose reference range applies only to samples taken after fasting for at least 8 hours.   BUN 23 8 - 23 mg/dL   Creatinine, Ser 1.04 (H) 0.44 - 1.00 mg/dL   Calcium 8.9 8.9 - 10.3 mg/dL   GFR, Estimated 56 (L) >60 mL/min    Comment: (NOTE) Calculated using the CKD-EPI Creatinine Equation (2021)    Anion gap 6 5 - 15    Comment: Performed at [facility], [address] [place], [place] [address]  CBC with Differential/Platelet     Status: Abnormal   Collection Time: 10/13/20  9:00 AM  Result Value Ref Range   WBC 10.3 4.0 - 10.5 K/uL   RBC 5.37 (H) 3.87 - 5.11 MIL/uL   Hemoglobin 14.8 12.0 - 15.0 g/dL   HCT 43.8 36.0 - 46.0 %   MCV 81.6 80.0 - 100.0 fL   MCH 27.6 26.0 - 34.0 pg   MCHC 33.8 30.0 - 36.0 g/dL   RDW 14.1 11.5 - 15.5 %   Platelets 313 150 - 400 K/uL   nRBC 0.0 0.0 - 0.2 %   Neutrophils Relative % 88 %   Neutro Abs 9.1 (H) 1.7 - 7.7 K/uL   Lymphocytes Relative 7 %   Lymphs Abs 0.7 0.7 - 4.0 K/uL   Monocytes Relative 4 %   Monocytes Absolute 0.4 0.1 - 1.0 K/uL   Eosinophils Relative 0 %   Eosinophils Absolute 0.0 0.0 - 0.5 K/uL   Basophils  Relative 0 %   Basophils Absolute 0.0 0.0 - 0.1 K/uL   Immature Granulocytes 1 %   Abs Immature Granulocytes 0.12 (H) 0.00 - 0.07 K/uL    Comment: Performed at [facility], [address] [place], [place] [address]  Basic metabolic panel     Status: Abnormal   Collection Time: 10/13/20  9:00 AM  Result Value Ref Range   Sodium 135 135 - 145 mmol/L   Potassium 3.9 3.5 - 5.1 mmol/L   Chloride 99 98 - 111 mmol/L   CO2 26 22 - 32 mmol/L   Glucose, Bld 252 (H) 70 - 99 mg/dL    Comment: Glucose reference range applies only to samples taken after fasting for at least 8 hours.   BUN 29 (H) 8 - 23 mg/dL   Creatinine, Ser 1.10 (H) 0.44 - 1.00 mg/dL   Calcium 9.2 8.9 - 10.3 mg/dL   GFR, Estimated 53 (L) >60 mL/min    Comment: (NOTE) Calculated using the CKD-EPI Creatinine Equation (2021)    Anion gap 10 5 - 15    Comment: Performed at [facility], [address], [place], [place] [address]    Radiology: DG Chest 2 View  Result Date: 10/08/2020 CLINICAL DATA:  Cough and shortness of breath. EXAM: CHEST - 2 VIEW COMPARISON:  October 05, 2020 FINDINGS: The heart size and mediastinal contours are within normal limits. Mild calcification of the aortic arch is seen. Both lungs are clear. The visualized skeletal structures are unremarkable. IMPRESSION: No active cardiopulmonary disease. Electronically Signed   By: Virgina Norfolk M.D.   On:  10/08/2020 18:41    No results found.  No results found.    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 10/20/2017   Unstable angina ([place]) 06/13/2017   New onset of headaches 06/12/2017   Photophobia of right eye 06/12/2017   Degeneration of lumbar intervertebral disc 04/28/2017   Osteoarthritis of knee 11/17/2016    Coronary artery disease 09/16/2016   History of cardioversion 09/16/2016   Hyperlipidemia, unspecified 09/16/2016   Hypertension 09/16/2016   Obesity, unspecified 09/16/2016   Status post cardiac catheterization 09/16/2016   Atrial fibrillation ([place]) 09/08/2016   Herpes simplex 03/06/2016   Vitamin D deficiency 03/06/2016   Cardiac arrhythmia 09/09/2015   Gastroesophageal reflux disease 09/09/2015   Obstructive sleep apnea syndrome 09/09/2015   Psoriasis 09/09/2015    1. Post covid-19 condition, unspecified She does have some shortness of breath and concern for long-term effects of COVID-19 including fibrotic lung disease.  Also I would suggest getting a CT scan of the chest to better look at the patient's pulmonary parenchyma - CT Chest Wo Contrast; Future  2. SOB (shortness of breath)  - Spirometry with Graph - Pulmonary function test; Future  3. OSA (obstructive sleep apnea) Noncompliant with CPAP therapy discussed the importance of CPAP usage she states that she is going to try to work better and trying to use the device.  4. Obesity, morbid ([place]) Obesity Counseling: Had a lengthy discussion regarding patients BMI and weight issues. Patient was instructed on portion control as well as increased activity. Also discussed caloric restrictions with trying to maintain intake less than 2000 Kcal. Discussions were made in accordance with the 5As of weight management. Simple actions such as not eating late and if able to, taking a walk is suggested.   5. CPAP use counseling CPAP Counseling: had a lengthy discussion with the patient regarding the importance of PAP therapy in management of the sleep apnea. Patient appears to understand the risk factor reduction and also understands the risks associated with untreated sleep apnea. Patient will try to make a good faith effort to remain compliant with therapy. Also instructed the patient on proper cleaning of the device including the water  must be changed daily if possible and use of distilled water is preferred. Patient understands that the machine should be regularly cleaned with appropriate recommended cleaning solutions that do not damage the PAP machine for example given white vinegar and water rinses. Other methods such as ozone treatment [place] not be as good as these simple methods to achieve cleaning.   6. Obstructive chronic bronchitis without exacerbation ([place]) States that she is a former smoker quit 2014.  As above ordered PFT spirometry will review and assess and then make adjustments to medications accordingly  General Counseling: I have discussed the findings of the evaluation and examination with [facility].  I have also discussed any further diagnostic evaluation thatmay be needed or ordered today. Alysson verbalizes understanding of the findings of todays visit. We also reviewed her medications today and discussed drug interactions and side effects including but not limited excessive drowsiness and altered mental states. We also discussed that there is always a risk not just to her but also people around her. she has been encouraged to call the office with any questions or concerns that should arise related  to todays visit.  Orders Placed This Encounter  Procedures   Spirometry with Graph    Order Specific Question:   Where should this test be performed?    Answer:   [facility]    Order Specific Question:   Basic spirometry    Answer:   Yes    Order Specific Question:   Spirometry pre & post bronchodilator    Answer:   No     Time spent: 11  I have personally obtained a history, examined the patient, evaluated laboratory and imaging results, formulated the assessment and plan and placed orders.    Allyne Gee, MD [facility] Pulmonary and Critical Care Sleep medicine

## 2021-01-13 ENCOUNTER — Other Ambulatory Visit: Payer: Self-pay

## 2021-01-13 ENCOUNTER — Ambulatory Visit (INDEPENDENT_AMBULATORY_CARE_PROVIDER_SITE_OTHER): Payer: Medicare HMO | Admitting: Internal Medicine

## 2021-01-13 DIAGNOSIS — R0602 Shortness of breath: Secondary | ICD-10-CM | POA: Diagnosis not present

## 2021-01-13 LAB — PULMONARY FUNCTION TEST

## 2021-01-19 ENCOUNTER — Ambulatory Visit: Admit: 2021-01-19 | Discharge: 2021-01-20 | Payer: MEDICARE | Attending: Dermatology | Primary: Dermatology

## 2021-01-19 DIAGNOSIS — L409 Psoriasis, unspecified: Principal | ICD-10-CM

## 2021-01-19 MED ORDER — TALTZ AUTOINJECTOR 80 MG/ML SUBCUTANEOUS
SUBCUTANEOUS | 0 refills | 0.00000 days | Status: CP
Start: 2021-01-19 — End: ?
  Filled 2021-01-29: qty 3, 28d supply, fill #0

## 2021-01-19 NOTE — Unmapped (Signed)
Dermatology Note     Assessment and Plan:      Suspected Psoriasis, although with atypical features and having failed multiple biologics >30% BSA involved  - Patient has had multiple biopsies in the past, most recently in 02/2020 which demonstrated:  ?? ?? ?? ??   ?? ??   03/10/2020 ?? ?? Final   ?? Left back, punch excision  -Psoriasiform epidermal hyperplasia with parakeratosis, favor psoriasis.  See comment.  ??   - Clinical presentation and biopsy findings have supported a diagnosis of psoriasis. However, patient has more pruritus than most patients with psoriasis and has failed multiple biologics (Tremfya, Cosentyx, Skyrizi, Cimzia). She has preferred to avoid [name] because of history of depression. She was inadequately controlled with methotrexate in the past, and switched back to Humira which she thought worked best in the past. However, she was badly flared on Humira last visit. Patient has not been taking Humira for several months and did not start methotrexate due to COVID-19 hospitalizations with [facility].   - Discussed treatment options including Taltz vs methotrexate vs   - Proceed as below:  - Continue hydroxyzine 25mg  by mouth as needed for itching, caution sedation. Take one at bedtime and increase to 3 times daily if needed for itching and if it doesn't make you too sleepy.  - Continue clobetasol BID PRN itch   - Plan to start Taltz for now, will ask insurance about restarting Humira if not approved for Taltz.   - Start ixekizumab (TALTZ AUTOINJECTOR) 80 mg/mL AtIn auto-injector; Inject 80mg  subcutaneously every 4 weeks  - Start ixekizumab (TALTZ AUTOINJECTOR) 80 mg/mL AtIn auto-injector; Inject 160mg  subcutaneously x1, then 80mg  every 2 weeks x 12 weeks then start maintenance dose    The patient was advised to call for an appointment should any new, changing, or symptomatic lesions develop.     RTC: Return in about 4 months (around 05/22/2021) for Follow up of psoriasis. or sooner as needed _________________________________________________________________      Chief Complaint     F/u psoriasis    HPI     Lisa Carlson is a 75 y.o. female who presents as a returning patient (last seen by Dr. Odis Luster on 06/12/2020) to [facility] for follow up of psoriasis. At last visit, patient was to continue Humira, start hydroxyzine 25 mg PRN, start methotrexate and folic acid pending labs that were not drawn. Per chart review, patient was seen by outside ED for suspected bronchitis vs COVID-19 on 06/20/20.     Patient reports her history of COVID-19 has caused her to miss Humira injections. She is experiencing problems from COVID-19 with walking, breathing, memory, vision. Her psoriasis flares are severe today on her abdomen and back, and she is experiencing intense pruritus that interrupts her sleep. She last injected her Humira a few weeks ago, but has not had one since.     The patient denies any other new or changing lesions or areas of concern.     Pertinent Past Medical History     No history of skin cancer    Problem List        Musculoskeletal and Integument    Psoriasis - Primary          Family History:   Negative for melanoma    Past Medical History, Family History, Social History, Medication List, Allergies, and Problem List were reviewed in the rooming section of Epic.     ROS: Other than symptoms mentioned in the HPI, no  fevers, chills, or other skin complaints    Physical Examination     GENERAL: Well-appearing female in no acute distress, resting comfortably.  NEURO: Alert and oriented, answers questions appropriately  PSYCH: Normal mood and affect  SKIN (Focal Skin Exam): Per patient request, examination of chest, abdomen, back was performed  - diffuse scaly erythematous plaques on abdomen, arms, legs and back     All areas not commented on are within normal limits or unremarkable    Scribe's Attestation: [name], MD obtained and performed the history, physical exam and medical decision making elements that were entered into the chart. Signed by Catalina Antigua, Scribe, on January 19, 2021 at 11:43 AM.    ----------------------------------------------------------------------------------------------------------------------  January 27, 2021 2:32 PM. Documentation assistance provided by the Scribe. I was present during the time the encounter was recorded. The information recorded by the Scribe was done at my direction and has been reviewed and validated by me.    Lisa Post, MD  ----------------------------------------------------------------------------------------------------------------------    (Approved Template 12/23/2019)

## 2021-01-21 ENCOUNTER — Ambulatory Visit: Payer: Medicare HMO

## 2021-01-21 DIAGNOSIS — L409 Psoriasis, unspecified: Principal | ICD-10-CM

## 2021-01-24 NOTE — Procedures (Signed)
[facility] [address] [place], [id number]    Complete Pulmonary Function Testing Interpretation:  FINDINGS:  The forced vital capacity is mildly decreased.  FEV1 is 1.59 L which is 76% of predicted and is mildly decreased.  Postbronchodilator no significant improvement was noted.  FEV1 FVC ratio was mildly decreased.  Total lung capacity is mildly decreased residual volume is mildly decreased.  Residual volume total capacity ratio was normal.  FRC was moderately decreased.  DLCO was mildly decreased.  IMPRESSION:  This pulmonary function study is suggestive of mild restrictive lung disease clinical correlation is recommended  Allyne Gee, MD [facility] Pulmonary Critical Care Medicine Sleep Medicine

## 2021-01-25 DIAGNOSIS — L409 Psoriasis, unspecified: Principal | ICD-10-CM

## 2021-01-25 MED ORDER — HYDROXYZINE HCL 25 MG TABLET
ORAL_TABLET | Freq: Three times a day (TID) | ORAL | 2 refills | 30.00000 days | PRN
Start: 2021-01-25 — End: ?

## 2021-01-26 DIAGNOSIS — J45909 Unspecified asthma, uncomplicated: Secondary | ICD-10-CM | POA: Diagnosis not present

## 2021-01-26 DIAGNOSIS — M5441 Lumbago with sciatica, right side: Secondary | ICD-10-CM | POA: Diagnosis not present

## 2021-01-26 DIAGNOSIS — M47816 Spondylosis without myelopathy or radiculopathy, lumbar region: Secondary | ICD-10-CM | POA: Diagnosis not present

## 2021-01-26 DIAGNOSIS — M48061 Spinal stenosis, lumbar region without neurogenic claudication: Secondary | ICD-10-CM | POA: Diagnosis not present

## 2021-01-26 DIAGNOSIS — G8929 Other chronic pain: Secondary | ICD-10-CM | POA: Diagnosis not present

## 2021-01-26 DIAGNOSIS — M25511 Pain in right shoulder: Secondary | ICD-10-CM | POA: Diagnosis not present

## 2021-01-26 MED ORDER — HYDROXYZINE HCL 25 MG TABLET
ORAL_TABLET | Freq: Three times a day (TID) | ORAL | 2 refills | 30.00000 days | Status: CP | PRN
Start: 2021-01-26 — End: ?

## 2021-01-27 NOTE — Unmapped (Signed)
[facility] Specialty Medication Onboarding    Specialty Medication: Taltz 80mg /ml autoinjector  Prior Authorization: Approved   Financial Assistance: No - copay  <$25  Final Copay/Day Supply: $0 / 28 days    Insurance Restrictions: Yes - max 1 month supply     Notes to Pharmacist: first fill of load 3 pens=28 days, then 2 pens=28 days    The triage team has completed the benefits investigation and has determined that the patient is able to fill this medication at [facility]. Please contact the patient to complete the onboarding or follow up with the prescribing physician as needed.

## 2021-01-28 ENCOUNTER — Other Ambulatory Visit: Payer: Self-pay

## 2021-01-28 ENCOUNTER — Encounter: Payer: Self-pay | Admitting: Internal Medicine

## 2021-01-28 ENCOUNTER — Ambulatory Visit (INDEPENDENT_AMBULATORY_CARE_PROVIDER_SITE_OTHER): Payer: Medicare HMO | Admitting: Internal Medicine

## 2021-01-28 VITALS — BP 168/91 | HR 68 | Temp 97.8°F | Resp 16 | Ht 64.5 in | Wt 299.2 lb

## 2021-01-28 DIAGNOSIS — U099 Post covid-19 condition, unspecified: Secondary | ICD-10-CM

## 2021-01-28 DIAGNOSIS — I480 Paroxysmal atrial fibrillation: Secondary | ICD-10-CM

## 2021-01-28 DIAGNOSIS — Z7189 Other specified counseling: Secondary | ICD-10-CM

## 2021-01-28 DIAGNOSIS — G4733 Obstructive sleep apnea (adult) (pediatric): Secondary | ICD-10-CM | POA: Diagnosis not present

## 2021-01-28 DIAGNOSIS — J449 Chronic obstructive pulmonary disease, unspecified: Secondary | ICD-10-CM

## 2021-01-28 NOTE — Unmapped (Signed)
[facility]   Patient Onboarding/Medication Counseling    Lisa Carlson is a 75 y.o. female with psoriasis who I am counseling today on initiation of therapy.  I am speaking to the patient.    Was a Nurse, learning disability used for this call? No    Verified patient's date of birth / HIPAA.    Specialty medication(s) to be sent: Inflammatory Disorders: Taltz      Non-specialty medications/supplies to be sent: n/a - patient already has a sharps container      Medications not needed at this time: n/a         Taltz (ixekizumab)    Medication & Administration     Dosage: Plaque psoriasis: Inject 160mg  under the skin at week 0, 80mg  at weeks 2, 4, 6, 8, 10, and 12, then 80mg  every 4 weeks    Lab tests required prior to treatment initiation:  ??? Tuberculosis: Tuberculosis screening resulted in a non-reactive Quantiferon TB Gold assay.    Administration:     Prefilled auto-injector pen  1. Gather all supplies needed for injection on a clean, flat working surface: medication pen removed from packaging, alcohol swab, sharps container, etc.  2. Look at the medication label - look for correct medication, correct dose, and check the expiration date  3. Look at the medication - the liquid visible in the window on the side of the pen device should appear clear and colorless to slight yellow  4. Lay the auto-injector pen on a flat surface and allow it to warm up to room temperature for at least 30 minutes  5. Select injection site - you can use the front of your thigh or your belly (but not the area 2 inches around your belly button); if someone else is giving you the injection you can also use your upper arm in the skin covering your triceps muscle  6. Prepare injection site - wash your hands and clean the skin at the injection site with an alcohol swab and let it air dry, do not touch the injection site again before the injection  7. Twist off the base safety cap in the direction of the arrows, do not remove until immediately prior to injection and do not touch the white needle cover  8. Place the clear base flat and firmly against your skin at the injection site at a 90 degree angle, hold the pen such that you can see the clear medication window  9. While holding the pen in place against the injection site, turn the lock ring to the unlock position; the pen is now ready to inject  10. Press the green injection button to initiate the injection, there will be a click when the injection starts  11. Continue to hold the pen firmly against your skin for about 10-15 seconds - you will see the gray plunger moving during the process; after 10-15 seconds you will hear a second click to let you know the injection is complete  12. To verify the injection is complete look and ensure the gray plunger is all the way at the bottom of the clear base; once completion is verified pull the pen away from your skin  13. Dispose of the used auto-injector pen immediately in your sharps disposal container the needle will be covered automatically  14. If you see any blood at the injection site, press a cotton ball or gauze on the site and maintain pressure until the bleeding stops, do not rub the injection  site    Adherence/Missed dose instructions:  If your injection is given more than 7 days after your scheduled injection date - consult your pharmacist for additional instructions on how to adjust your dosing schedule.    Goals of Therapy       Plaque Psoriasis  ??? Minimize areas of skin involvement (% BSA)  ??? Avoidance of long term glucocorticoid use  ??? Maintenance of effective psychosocial functioning      Side Effects & Monitoring Parameters     ??? Injection site reaction (redness, irritation, inflammation localized to the site of administration)  ??? Signs of a common cold - minor sore throat, runny or stuffy nose, etc.  ??? Upset stomach    The following side effects should be reported to the provider:  ??? Signs of a hypersensitivity reaction - rash; hives; itching; red, swollen, blistered, or peeling skin; wheezing; tightness in the chest or throat; difficulty breathing, swallowing, or talking; swelling of the mouth, face, lips, tongue, or throat; etc.  ??? Reduced immune function - report signs of infection such as fever; chills; body aches; very bad sore throat; ear or sinus pain; cough; more sputum or change in color of sputum; pain with passing urine; wound that will not heal, etc.  Also at a slightly higher risk of some malignancies (mainly skin and blood cancers) due to this reduced immune function.  o In the case of signs of infection - the patient should hold the next dose of Taltz?? and call your primary care provider to ensure adequate medical care.  Treatment [place] be resumed when infection is treated and patient is asymptomatic.  ??? Stomach pain or diarrhea  ??? Redness or white patches in the mouth or throat      Contraindications, Warnings, & Precautions     ??? Have your bloodwork checked as you have been told by your prescriber  ??? New or worsened inflammatory bowel disease has happened with this drug  ??? Talk with your doctor if you are pregnant, planning to become pregnant, or breastfeeding  ??? Discuss the possible need for holding your dose(s) of Taltz when a planned procedure is scheduled with the prescriber as it Hitz delay healing/recovery timeline       Drug/Food Interactions     ??? Medication list reviewed in Epic. The patient was instructed to inform the care team before taking any new medications or supplements. No drug interactions identified.   ??? Talk with you prescriber or pharmacist before receiving any live vaccinations while taking this medication and after you stop taking it    Storage, Handling Precautions, & Disposal     ??? Store this medication in the refrigerator.  Do not freeze  ??? If needed, you [place] store at room temperature for up to 5 days  ??? Store in original packaging, protected from light  ??? Do not shake  ??? Dispose of used syringes/pens in a sharps disposal container          Current Medications (including OTC/herbals), Comorbidities and Allergies     Current Outpatient Medications   Medication Sig Dispense Refill   ??? albuterol (PROVENTIL HFA;VENTOLIN HFA) 90 mcg/actuation inhaler Inhale 2 puffs every six (6) hours as needed for wheezing.     ??? amoxicillin-clavulanate (AUGMENTIN) 875-125 mg per tablet amoxicillin 875 mg-potassium clavulanate 125 mg tablet     ??? apixaban (ELIQUIS) 5 mg Tab Take 5 mg by mouth Two (2) times a day.     ??? atorvastatin (LIPITOR) 20  MG tablet Take 20 mg by mouth daily.      ??? B-complex with vitamin C (TOTAL B W/C) tablet vitamin b complex with b12     ??? baclofen (LIORESAL) 10 MG tablet Take 10 mg by mouth Two (2) times a day.      ??? betamethasone, augmented, (DIPROLENE) 0.05 % ointment Apply to scaly rash twice daily until smooth, then stop 100 g 5   ??? calcium carbonate (OS-CAL) 500 mg calcium (1,250 mg) chewable tablet Chew 0.5 tablets daily as needed.      ??? camphor-menthoL (SARNA) 0.5-0.5 % lotion Apply as needed for itch 222 mL 3   ??? cetirizine (ZYRTEC) 10 MG tablet Take 10 mg by mouth daily.      ??? ciprofloxacin-dexamethasone (CIPRODEX) 0.3-0.1 % otic suspension Administer 1 drop into both ears daily as needed.     ??? clobetasol (TEMOVATE) 0.05 % cream Apply 1 application topically Two (2) times a day.     ??? clobetasoL (TEMOVATE) 0.05 % external solution Apply topically Two (2) times a day. 50 mL 3   ??? clobetasoL (TEMOVATE) 0.05 % ointment Apply 1 application topically Two (2) times a day. 120 g 1   ??? cyclobenzaprine (FLEXERIL) 10 MG tablet cyclobenzaprine 10 mg tablet     ??? diltiazem (TIAZAC) 120 MG 24 hr capsule diltiazem CD 120 mg capsule,extended release 24 hr     ??? empty container Misc Use as directed to dispose of Humira pens. 1 each 2   ??? esomeprazole (NEXIUM) 40 MG capsule Take 40 mg by mouth every morning before breakfast.     ??? fluconazole (DIFLUCAN) 100 MG tablet Take 100 mg by mouth daily.  0   ??? fluocinolone (SYNALAR) 0.01 % external solution fluocinolone 0.01 % topical solution     ??? fluocinolone acetonide oiL 0.01 % Drop Administer 1 drop into both ears Two (2) times a day. 20 mL 2   ??? fluticasone (FLONASE) 50 mcg/actuation nasal spray 2 sprays into each nostril daily.      ??? folic acid (FOLVITE) 1 MG tablet Take 5 tablets daily on non-methotrexate days. 150 tablet 5   ??? furosemide (LASIX) 20 MG tablet Take 20 mg by mouth Two (2) times a day.     ??? gabapentin (NEURONTIN) 300 MG capsule Take 300 mg by mouth Three (3) times a day.     ??? hydroCHLOROthiazide (HYDRODIURIL) 25 MG tablet Take 25 mg by mouth daily.      ??? HYDROcodone-acetaminophen (NORCO) 5-325 mg per tablet hydrocodone 5 mg-acetaminophen 325 mg tablet     ??? hydrOXYzine (ATARAX) 25 MG tablet Take 1 tablet (25 mg total) by mouth Three (3) times a day as needed. 90 tablet 2   ??? ibuprofen (ADVIL,MOTRIN) 800 MG tablet Take 800 mg by mouth every six (6) hours as needed for pain.      ??? ipratropium (ATROVENT) 42 mcg (0.06 %) nasal spray 2 sprays into each nostril four (4) times a day.      ??? ivermectin (STROMECTOL) 3 mg Tab Take 9 tablets by mouth and repeat in one week 18 tablet 0   ??? ixekizumab (TALTZ AUTOINJECTOR) 80 mg/mL AtIn auto-injector Inject the contents of 1 pen (80 mg total) under the skin every twenty-eight (28) days. 1 mL 11   ??? ixekizumab (TALTZ AUTOINJECTOR) 80 mg/mL AtIn auto-injector Inject the contents of 2 pens (160mg ) under the skin on day 1, THEN inject 1 pen (80mg ) every 2 weeks for 12 weeks. THEN  start maintenance dose. 8 mL 0   ??? levothyroxine 25 mcg cap Take 50 mg by mouth once.     ??? losartan-hydrochlorothiazide (HYZAAR) 50-12.5 mg per tablet losartan 50 mg-hydrochlorothiazide 12.5 mg tablet     ??? melatonin 10 mg Tab Take by mouth.      ??? meloxicam (MOBIC) 15 MG tablet meloxicam 15 mg tablet     ??? montelukast (SINGULAIR) 10 mg tablet Take 10 mg by mouth nightly.      ??? neomycin-bacitracin-polymyxin-hydrocortisone (CORTISPORIN) 3.5-400-10,000 mg-unit/g-1% ophthalmic ointment Two (2) times a day.     ??? neomycin-polymyxin-hydrocortisone (CORTISPORIN) 3.5-10,000-1 mg/mL-unit/mL-% otic solution neomycin-polymyxin-hydrocort 3.5 mg/mL-10,000 unit/mL-1 % ear solution     ??? nitroglycerin (NITROSTAT) 0.4 MG SL tablet Place 0.4 mg under the tongue every five (5) minutes as needed for chest pain.     ??? nystatin (MYCOSTATIN) powder      ??? nystatin-triamcinolone (MYCOLOG II) 100,000-0.1 unit/g-% cream Apply topically four (4) times a day. 30 g 3   ??? PARoxetine (PAXIL) 20 MG tablet Take 20 mg by mouth daily.      ??? PNV no.63-iron,carbonyl-FA-dha 27 mg iron- 800 mcg-200 mg cap Take 300 mg by mouth every morning before breakfast.      ??? pramoxine (SARNA SENSITIVE) 1 % Lotn Apply 1 application topically two (2) times a day. 222 mL 6   ??? predniSONE (DELTASONE) 5 MG tablet Take 5 mg by mouth Two (2) times a day.  0   ??? pregabalin (LYRICA) 50 MG capsule Take 50 mg by mouth daily as needed.      ??? promethazine (PHENERGAN) 25 MG tablet promethazine 25 mg tablet     ??? ranitidine (ZANTAC) 150 MG tablet Take 150 mg by mouth Two (2) times a day.     ??? ranolazine (RANEXA) 1,000 mg SR tablet Take 1,000 mg by mouth every morning before breakfast.      ??? silver sulfaDIAZINE (SILVADENE, SSD) 1 % cream      ??? SPIRIVA RESPIMAT 1.25 mcg/actuation Mist USE AS DIRECTED 1 INHALATION EVERY DAY     ??? SYMBICORT 160-4.5 mcg/actuation inhaler INHALE 1 PUFF ORALLY EVERY 12 HOURS, RINSE MOUTH WELL AFTER EACH USELRH  1   ??? traZODone (DESYREL) 100 MG tablet Take 100 mg by mouth nightly.      ??? triamcinolone (KENALOG) 0.1 % ointment APPLY TO AFFECTED AREA TWICE A DAY 454 g 0   ??? triamcinolone (KENALOG) 0.1 % ointment Apply a thick layer twice a day to psoriasis until smooth, then stop 454 g 3     No current facility-administered medications for this visit.       Allergies   Allergen Reactions   ??? Codeine Itching, Hives, Other (See Comments) and Rash       Patient Active Problem List Diagnosis   ??? Atrial fibrillation (CMS-HCC)   ??? Coronary artery disease   ??? Gastroesophageal reflux disease   ??? Hyperlipidemia, unspecified   ??? Hypertension   ??? Obesity, unspecified   ??? Obstructive sleep apnea syndrome   ??? Psoriasis       Reviewed and up to date in Epic.    Appropriateness of Therapy     Acute infections noted within Epic:  No active infections  Patient reported infection: None    Is medication and dose appropriate based on diagnosis and infection status? Yes    Prescription has been clinically reviewed: Yes      Baseline Quality of Life Assessment  How many days over the past month did your psoriasis  keep you from your normal activities? For example, brushing your teeth or getting up in the morning. 0    Financial Information     Medication Assistance provided: Prior Authorization    Anticipated copay of $0 reviewed with patient. Verified delivery address.    Delivery Information     Scheduled delivery date: 10/21    Expected start date: 10/21    Medication will be delivered via Same Day Courier to the prescription address in [facility].  This shipment will not require a signature.      Explained the services we provide at [facility] and that each month we would call to set up refills.  Stressed importance of returning phone calls so that we could ensure they receive their medications in time each month.  Informed patient that we should be setting up refills 7-10 days prior to when they will run out of medication.  A pharmacist will reach out to perform a clinical assessment periodically.  Informed patient that a welcome packet, containing information about our pharmacy and other support services, a Notice of Privacy Practices, and a drug information handout will be sent.      The patient or caregiver noted above participated in the development of this care plan and knows that they can request review of or adjustments to the care plan at any time.      Patient or caregiver verbalized understanding of the above information as well as how to contact the pharmacy at [phone] option 4 with any questions/concerns.  The pharmacy is open Monday through Friday 8:30am-4:30pm.  A pharmacist is available 24/7 via pager to answer any clinical questions they Bhattacharyya have.    Patient Specific Needs     - Does the patient have any physical, cognitive, or cultural barriers? No    - Does the patient have adequate living arrangements? (i.e. the ability to store and take their medication appropriately) Yes    - Did you identify any home environmental safety or security hazards? No    - Patient prefers to have medications discussed with  Patient     - Is the patient or caregiver able to read and understand education materials at a high school level or above? Yes    - Patient's primary language is  English     - Is the patient high risk? No    - Does the patient require physician intervention or other additional services (i.e. dietary/nutrition, smoking cessation, social work)? No      Clydell Hakim  [facility] Specialty Pharmacist

## 2021-01-28 NOTE — Progress Notes (Signed)
[facility] [place], [place] [address]  Pulmonary Sleep Medicine   Office Visit Note  Patient Name: Patricia Walker DOB: August 06, 1945 MRN [id number]  Date of Service: 01/28/2021  Complaints/HPI: Post Covid in July COPD OSA no using the CPAP at this time patient has continued to not use the PAP therapy.  Her COPD has been under fairly good control.  She did have some worsening of symptoms after development of COVID.  Seems to be slowly improving.  Told her the importance of compliance with recommended therapy especially with the PAP.  ROS  General: (-) fever, (-) chills, (-) night sweats, (-) weakness Skin: (-) rashes, (-) itching,. Eyes: (-) visual changes, (-) redness, (-) itching. Nose and Sinuses: (-) nasal stuffiness or itchiness, (-) postnasal drip, (-) nosebleeds, (-) sinus trouble. Mouth and Throat: (-) sore throat, (-) hoarseness. Neck: (-) swollen glands, (-) enlarged thyroid, (-) neck pain. Respiratory: - cough, (-) bloody sputum, + shortness of breath, - wheezing. Cardiovascular: - ankle swelling, (-) chest pain. Lymphatic: (-) lymph node enlargement. Neurologic: (-) numbness, (-) tingling. Psychiatric: (-) anxiety, (-) depression   Current Medication: Outpatient Encounter Medications as of 01/28/2021  Medication Sig Note   albuterol (VENTOLIN HFA) 108 (90 Base) MCG/ACT inhaler Inhale 2 puffs into the lungs every 6 (six) hours as needed for wheezing or shortness of breath.    apixaban (ELIQUIS) 5 MG TABS tablet Take 5 mg by mouth 2 (two) times daily.    atorvastatin (LIPITOR) 20 MG tablet Take 20 mg by mouth every evening.    azelastine (ASTELIN) 0.1 % nasal spray Place 2 sprays into both nostrils 2 (two) times daily as needed for rhinitis or allergies.  03/26/2019: PRN   budesonide-formoterol (SYMBICORT) 160-4.5 MCG/ACT inhaler Inhale 2 puffs into the lungs 2 (two) times daily.    butalbital-acetaminophen-caffeine (FIORICET) 50-325-40 MG tablet  Take 1 tablet by mouth every 6 (six) hours as needed for headache.    folic acid (FOLVITE) 1 MG tablet Take 1 mg by mouth daily.    furosemide (LASIX) 20 MG tablet Take 20 mg by mouth daily as needed for fluid.    guaiFENesin-dextromethorphan (ROBITUSSIN DM) 100-10 MG/5ML syrup Take 5 mLs by mouth every 4 (four) hours as needed for cough.    levothyroxine (SYNTHROID, LEVOTHROID) 50 MCG tablet Take 50 mcg by mouth daily before breakfast.    ondansetron (ZOFRAN-ODT) 4 MG disintegrating tablet Take 1 tablet (4 mg total) by mouth every 8 (eight) hours as needed for nausea or vomiting.    PARoxetine (PAXIL) 20 MG tablet Take 20 mg by mouth daily.    pregabalin (LYRICA) 50 MG capsule Take 50 mg by mouth daily as needed for pain.    ranitidine (ZANTAC) 150 MG tablet Take 150 mg by mouth 2 (two) times daily.    traMADol (ULTRAM) 50 MG tablet Take 2 tablets (100 mg total) by mouth every 6 (six) hours as needed for moderate pain or severe pain.    triamcinolone ointment (KENALOG) 0.1 % Apply 1 application topically as directed.    [DISCONTINUED] Adalimumab (HUMIRA PEN) 40 MG/0.4ML PNKT Inject into the skin. (Patient not taking: Reported on 01/28/2021)    [DISCONTINUED] predniSONE (STERAPRED UNI-PAK 21 TAB) 10 MG (21) TBPK tablet 40 mg pod x 2 days, 30 mg pod x 2 days, 20 mg pod x 2 days, 10 mg pod x 3 days (Patient not taking: Reported on 01/28/2021)    [DISCONTINUED] SKYRIZI, 150 MG DOSE, 75 MG/0.83ML PSKT  Inject 1 Dose into the skin every 3 (three) months. (Patient not taking: Reported on 01/28/2021)    No facility-administered encounter medications on file as of 01/28/2021.    Surgical History: Past Surgical History:  Procedure Laterality Date   ARTERY BIOPSY Right 07/21/2017   Procedure: BIOPSY TEMPORAL ARTERY;  Surgeon: [name], MD;  Location: [facility];  Service: Vascular;  Laterality: Right;   CARDIAC CATHETERIZATION     CARDIOVERSION Right 09/01/2016   Procedure: Cardioversion;   Surgeon: [name], MD;  Location: [facility];  Service: Cardiovascular;  Laterality: Right;   CARDIOVERSION N/A 09/09/2016   Procedure: Cardioversion;  Surgeon: [name], MD;  Location: [facility];  Service: Cardiovascular;  Laterality: N/A;   CATARACT EXTRACTION W/PHACO Right 03/26/2019   Procedure: CATARACT EXTRACTION PHACO AND INTRAOCULAR LENS PLACEMENT (IOC) RIGHT 6.45, 00:39.9;  Surgeon: Birder Robson, MD;  Location: [place];  Service: Ophthalmology;  Laterality: Right;   CATARACT EXTRACTION W/PHACO Left 04/16/2019   Procedure: CATARACT EXTRACTION PHACO AND INTRAOCULAR LENS PLACEMENT (IOC) LEFT;   3.14, 00:24.9;  Surgeon: Birder Robson, MD;  Location: [place];  Service: Ophthalmology;  Laterality: Left;  sleep apnea-CPAP   COLONOSCOPY WITH PROPOFOL N/A 08/28/2019   Procedure: COLONOSCOPY WITH PROPOFOL;  Surgeon: Toledo, Benay Pike, MD;  Location: [facility];  Service: Gastroenterology;  Laterality: N/A;   ELECTROPHYSIOLOGIC STUDY N/A 02/01/2016   Procedure: CARDIOVERSION;  Surgeon: [name], MD;  Location: [facility];  Service: Cardiovascular;  Laterality: N/A;   ESOPHAGEAL DILATION     ESOPHAGOGASTRODUODENOSCOPY (EGD) WITH PROPOFOL N/A 02/06/2017   Procedure: ESOPHAGOGASTRODUODENOSCOPY (EGD) WITH PROPOFOL;  Surgeon: [name], MD;  Location: [facility];  Service: Gastroenterology;  Laterality: N/A;   ESOPHAGOGASTRODUODENOSCOPY (EGD) WITH PROPOFOL N/A 08/28/2019   Procedure: ESOPHAGOGASTRODUODENOSCOPY (EGD) WITH PROPOFOL;  Surgeon: Toledo, Benay Pike, MD;  Location: [facility];  Service: Gastroenterology;  Laterality: N/A;   EUS N/A 02/16/2017   Procedure: FULL UPPER ENDOSCOPIC ULTRASOUND (EUS) RADIAL;  Surgeon: [name], MD;  Location: [facility];  Service: Gastroenterology;  Laterality: N/A;   LEFT HEART CATH AND CORONARY ANGIOGRAPHY N/A 09/08/2016   Procedure: Left Heart Cath and Coronary Angiography;  Surgeon: [name],  MD;  Location: [place] [facility];  Service: Cardiovascular;  Laterality: N/A;   US ECHOCARDIOGRAPHY      Medical History: Past Medical History:  Diagnosis Date   Anxiety    Arthritis    Asthma    Coronary artery disease    mild, nonobstructive   Depression    Dyspnea    on exertion   Dysrhythmia    Atrial Fibrillation   GERD (gastroesophageal reflux disease)    Heart murmur    Hyperlipidemia    Hypertension    Hypothyroidism    MI, old 2016   nonobstructive CAD by Cath   Morbid obesity ([place])    Persistent atrial fibrillation (HCC)    Pneumonia 06/2018   and RSV   Psoriasis    Sleep apnea    compliant with CPAP   Thyroid disease     Family History: Family History  Problem Relation Age of Onset   Breast cancer Paternal Aunt    Other Father    Heart attack Father     Social History: Social History   Socioeconomic History   Marital status: Divorced    Spouse name: Not on file   Number of children: Not on file   Years of education: Not on file  Highest education level: Not on file  Occupational History   Not on file  Tobacco Use   Smoking status: Former    Types: Cigarettes    Quit date: 02/01/2013    Years since quitting: 7.9   Smokeless tobacco: Never  Vaping Use   Vaping Use: Never used  Substance and Sexual Activity   Alcohol use: No   Drug use: No   Sexual activity: Not on file  Other Topics Concern   Not on file  Social History Narrative   Lives in [place] with multiple family members   unemployed   Social Determinants of Health   Financial Resource Strain: Not on file  Food Insecurity: Not on file  Transportation Needs: Not on file  Physical Activity: Not on file  Stress: Not on file  Social Connections: Not on file  Intimate Partner Violence: Not on file    Vital Signs: Blood pressure (!) 168/91, pulse 68, temperature 97.8 F (36.6 C), resp. rate 16, height 5' 4.5" (1.638 m), weight 299 lb 3.2 oz (135.7 kg), SpO2 98  %.  Examination: General Appearance: The patient is well-developed, well-nourished, and in no distress. Skin: Gross inspection of skin unremarkable. Head: normocephalic, no gross deformities. Eyes: no gross deformities noted. ENT: ears appear grossly normal no exudates. Neck: Supple. No thyromegaly. No LAD. Respiratory: no rhonchi noted at this time. Cardiovascular: Normal S1 and S2 without murmur or rub. Extremities: No cyanosis. pulses are equal. Neurologic: Alert and oriented. No involuntary movements.  LABS: No results found for this or any previous visit (from the past 2160 hour(s)).  Radiology: DG Chest 2 View  Result Date: 10/08/2020 CLINICAL DATA:  Cough and shortness of breath. EXAM: CHEST - 2 VIEW COMPARISON:  October 05, 2020 FINDINGS: The heart size and mediastinal contours are within normal limits. Mild calcification of the aortic arch is seen. Both lungs are clear. The visualized skeletal structures are unremarkable. IMPRESSION: No active cardiopulmonary disease. Electronically Signed   By: Virgina Norfolk M.D.   On: 10/08/2020 18:41    No results found.  No results found.    Assessment and Plan: Patient Active Problem List   Diagnosis Date Noted   COPD exacerbation ([place]) 10/08/2020   Pneumonia 06/15/2018   Multifocal pneumonia 06/14/2018   BMI 45.0-49.9, adult ([place]) 06/04/2018   Iliotibial band friction syndrome of both knees 10/20/2017   Unstable angina ([place]) 06/13/2017   New onset of headaches 06/12/2017   Photophobia of right eye 06/12/2017   Degeneration of lumbar intervertebral disc 04/28/2017   Osteoarthritis of knee 11/17/2016   Coronary artery disease 09/16/2016   History of cardioversion 09/16/2016   Hyperlipidemia, unspecified 09/16/2016   Hypertension 09/16/2016   Obesity, unspecified 09/16/2016   Status post cardiac catheterization 09/16/2016   Atrial fibrillation ([place]) 09/08/2016   Herpes simplex 03/06/2016   Vitamin D deficiency 03/06/2016    Cardiac arrhythmia 09/09/2015   Gastroesophageal reflux disease 09/09/2015   Obstructive sleep apnea syndrome 09/09/2015   Psoriasis 09/09/2015    1. OSA (obstructive sleep apnea) Not using CPAP once again was counseled  2. 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  Restriction [place] be coming from her recent bout with COVID we will need to follow this  along in addition obesity [place] be contributing to the restriction  3. Obesity, morbid ([place]) Obesity Counseling: Had a lengthy discussion regarding patients BMI and weight issues. Patient was instructed on portion control as well as increased activity. Also discussed caloric restrictions with trying to maintain intake less than 2000 Kcal. Discussions were made in accordance with the 5As of weight management. Simple actions such as not eating late and if able to, taking a walk is suggested.   4. CPAP use counseling Once again counseled her compliance with PAP she states this is not really likely to happen  5. Post covid-19 condition, unspecified Will follow along closely seems to be slowly improving  6. Paroxysmal atrial fibrillation (HCC) Rate has been controlled  General Counseling: I have discussed the findings of the evaluation and examination with Patricia Walker.  I have also discussed any further diagnostic evaluation thatmay be needed or ordered today. Patricia Walker verbalizes understanding of the findings of todays visit. We also reviewed her medications today and discussed drug interactions and side effects including but not limited excessive drowsiness and altered mental states. We also discussed that there is always a risk not just to her but also people around her. she has been  encouraged to call the office with any questions or concerns that should arise related to todays visit.  No orders of the defined types were placed in this encounter.    Time spent: 24  I have personally obtained a history, examined the patient, evaluated laboratory and imaging results, formulated the assessment and plan and placed orders.    Allyne Gee, MD [facility] Pulmonary and Critical Care Sleep medicine

## 2021-01-28 NOTE — Patient Instructions (Signed)

## 2021-02-01 DIAGNOSIS — E782 Mixed hyperlipidemia: Secondary | ICD-10-CM | POA: Diagnosis not present

## 2021-02-01 DIAGNOSIS — G4733 Obstructive sleep apnea (adult) (pediatric): Secondary | ICD-10-CM | POA: Diagnosis not present

## 2021-02-01 DIAGNOSIS — I10 Essential (primary) hypertension: Secondary | ICD-10-CM | POA: Diagnosis not present

## 2021-02-01 DIAGNOSIS — L409 Psoriasis, unspecified: Secondary | ICD-10-CM | POA: Diagnosis not present

## 2021-02-04 DIAGNOSIS — L409 Psoriasis, unspecified: Principal | ICD-10-CM

## 2021-02-04 DIAGNOSIS — M47816 Spondylosis without myelopathy or radiculopathy, lumbar region: Secondary | ICD-10-CM | POA: Diagnosis not present

## 2021-02-04 MED ORDER — HYDROXYZINE HCL 25 MG TABLET
ORAL_TABLET | 1 refills | 0.00000 days
Start: 2021-02-04 — End: ?

## 2021-02-05 MED ORDER — HYDROXYZINE HCL 25 MG TABLET
ORAL_TABLET | ORAL | 1 refills | 0.00000 days | Status: CP
Start: 2021-02-05 — End: ?

## 2021-02-09 ENCOUNTER — Emergency Department: Payer: Medicare HMO

## 2021-02-09 ENCOUNTER — Other Ambulatory Visit: Payer: Self-pay

## 2021-02-09 ENCOUNTER — Emergency Department
Admission: EM | Admit: 2021-02-09 | Discharge: 2021-02-09 | Disposition: A | Discharge status: Home / Self Care | Payer: Medicare HMO | Attending: Emergency Medicine | Admitting: Emergency Medicine

## 2021-02-09 DIAGNOSIS — Z8616 Personal history of COVID-19: Secondary | ICD-10-CM | POA: Diagnosis not present

## 2021-02-09 DIAGNOSIS — R519 Headache, unspecified: Secondary | ICD-10-CM | POA: Diagnosis not present

## 2021-02-09 DIAGNOSIS — Z7901 Long term (current) use of anticoagulants: Secondary | ICD-10-CM | POA: Diagnosis not present

## 2021-02-09 DIAGNOSIS — J09X2 Influenza due to identified novel influenza A virus with other respiratory manifestations: Secondary | ICD-10-CM | POA: Diagnosis not present

## 2021-02-09 DIAGNOSIS — I4891 Unspecified atrial fibrillation: Secondary | ICD-10-CM | POA: Insufficient documentation

## 2021-02-09 DIAGNOSIS — Z79899 Other long term (current) drug therapy: Secondary | ICD-10-CM | POA: Insufficient documentation

## 2021-02-09 DIAGNOSIS — I10 Essential (primary) hypertension: Secondary | ICD-10-CM | POA: Insufficient documentation

## 2021-02-09 DIAGNOSIS — Z87891 Personal history of nicotine dependence: Secondary | ICD-10-CM | POA: Diagnosis not present

## 2021-02-09 DIAGNOSIS — R5383 Other fatigue: Secondary | ICD-10-CM | POA: Diagnosis not present

## 2021-02-09 DIAGNOSIS — Z7951 Long term (current) use of inhaled steroids: Secondary | ICD-10-CM | POA: Insufficient documentation

## 2021-02-09 DIAGNOSIS — J45909 Unspecified asthma, uncomplicated: Secondary | ICD-10-CM | POA: Insufficient documentation

## 2021-02-09 DIAGNOSIS — Z20822 Contact with and (suspected) exposure to covid-19: Secondary | ICD-10-CM | POA: Diagnosis not present

## 2021-02-09 DIAGNOSIS — J101 Influenza due to other identified influenza virus with other respiratory manifestations: Secondary | ICD-10-CM | POA: Diagnosis not present

## 2021-02-09 DIAGNOSIS — E039 Hypothyroidism, unspecified: Secondary | ICD-10-CM | POA: Insufficient documentation

## 2021-02-09 DIAGNOSIS — R059 Cough, unspecified: Secondary | ICD-10-CM | POA: Diagnosis not present

## 2021-02-09 DIAGNOSIS — I2510 Atherosclerotic heart disease of native coronary artery without angina pectoris: Secondary | ICD-10-CM | POA: Diagnosis not present

## 2021-02-09 DIAGNOSIS — J441 Chronic obstructive pulmonary disease with (acute) exacerbation: Secondary | ICD-10-CM | POA: Insufficient documentation

## 2021-02-09 LAB — CBC
HCT: 49 % — ABNORMAL HIGH (ref 36.0–46.0)
Hemoglobin: 16.4 g/dL — ABNORMAL HIGH (ref 12.0–15.0)
MCH: 28.1 pg (ref 26.0–34.0)
MCHC: 33.5 g/dL (ref 30.0–36.0)
MCV: 83.9 fL (ref 80.0–100.0)
Platelets: 322 10*3/uL (ref 150–400)
RBC: 5.84 MIL/uL — ABNORMAL HIGH (ref 3.87–5.11)
RDW: 12.7 % (ref 11.5–15.5)
WBC: 7.3 10*3/uL (ref 4.0–10.5)
nRBC: 0 % (ref 0.0–0.2)

## 2021-02-09 LAB — RESP PANEL BY RT-PCR (FLU A&B, COVID) ARPGX2
Influenza A by PCR: POSITIVE — AB
Influenza B by PCR: NEGATIVE
SARS Coronavirus 2 by RT PCR: NEGATIVE

## 2021-02-09 LAB — LACTIC ACID, PLASMA: Lactic Acid, Venous: 1.9 mmol/L (ref 0.5–1.9)

## 2021-02-09 MED ORDER — OSELTAMIVIR PHOSPHATE 75 MG PO CAPS: 75.0000 mg | ORAL_CAPSULE | Freq: Two times a day (BID) | ORAL | 0 refills | Status: AC

## 2021-02-09 MED ORDER — SODIUM CHLORIDE 0.9 % IV BOLUS
1000.0000 mL | Freq: Once | INTRAVENOUS | Status: AC
  Administered 2021-02-09: 1000 mL via INTRAVENOUS

## 2021-02-09 NOTE — ED Provider Notes (Signed)
[facility] Emergency Department Provider Note  Time seen: 1:38 PM  I have reviewed the triage vital signs and the nursing notes.   HISTORY  Chief Complaint Cough, Headache, and Generalized Body Aches   HPI Patricia Walker is a 75 y.o. female with a past medical history of anxiety, arthritis, CAD, gastric reflux, hypertension, hyperlipidemia, presents to the emergency department for subjective fever cough congestion and generalized fatigue over the past 2 days.  According to the patient over the past 2 days she has been coughing with congestion, subjective fever at home has been nauseated at times with generalized body aches and fatigue.   Past Medical History:  Diagnosis Date   Anxiety    Arthritis    Asthma    Coronary artery disease    mild, nonobstructive   Depression    Dyspnea    on exertion   Dysrhythmia    Atrial Fibrillation   GERD (gastroesophageal reflux disease)    Heart murmur    Hyperlipidemia    Hypertension    Hypothyroidism    MI, old 2016   nonobstructive CAD by Cath   Morbid obesity ([place])    Persistent atrial fibrillation ([place])    Pneumonia 06/2018   and RSV   Psoriasis    Sleep apnea    compliant with CPAP   Thyroid disease     Patient Active Problem List   Diagnosis Date Noted   COPD exacerbation ([place]) 10/08/2020   Pneumonia 06/15/2018   Multifocal pneumonia 06/14/2018   BMI 45.0-49.9, adult ([place]) 06/04/2018   Iliotibial band friction syndrome of both knees 10/20/2017   Unstable angina ([place]) 06/13/2017   New onset of headaches 06/12/2017   Photophobia of right eye 06/12/2017   Degeneration of lumbar intervertebral disc 04/28/2017   Osteoarthritis of knee 11/17/2016   Coronary artery disease 09/16/2016   History of cardioversion 09/16/2016   Hyperlipidemia, unspecified 09/16/2016   Hypertension 09/16/2016   Obesity, unspecified 09/16/2016   Status post cardiac catheterization 09/16/2016   Atrial fibrillation ([place])  09/08/2016   Herpes simplex 03/06/2016   Vitamin D deficiency 03/06/2016   Cardiac arrhythmia 09/09/2015   Gastroesophageal reflux disease 09/09/2015   Obstructive sleep apnea syndrome 09/09/2015   Psoriasis 09/09/2015    Past Surgical History:  Procedure Laterality Date   ARTERY BIOPSY Right 07/21/2017   Procedure: BIOPSY TEMPORAL ARTERY;  Surgeon: [name], MD;  Location: [facility];  Service: Vascular;  Laterality: Right;   CARDIAC CATHETERIZATION     CARDIOVERSION Right 09/01/2016   Procedure: Cardioversion;  Surgeon: [name], MD;  Location: [facility];  Service: Cardiovascular;  Laterality: Right;   CARDIOVERSION N/A 09/09/2016   Procedure: Cardioversion;  Surgeon: [name], MD;  Location: [facility];  Service: Cardiovascular;  Laterality: N/A;   CATARACT EXTRACTION W/PHACO Right 03/26/2019   Procedure: CATARACT EXTRACTION PHACO AND INTRAOCULAR LENS PLACEMENT ([place]) RIGHT 6.45, 00:39.9;  Surgeon: Birder Robson, MD;  Location: [place];  Service: Ophthalmology;  Laterality: Right;   CATARACT EXTRACTION W/PHACO Left 04/16/2019   Procedure: CATARACT EXTRACTION PHACO AND INTRAOCULAR LENS PLACEMENT (IOC) LEFT;   3.14, 00:24.9;  Surgeon: Birder Robson, MD;  Location: [place];  Service: Ophthalmology;  Laterality: Left;  sleep apnea-CPAP   COLONOSCOPY WITH PROPOFOL N/A 08/28/2019   Procedure: COLONOSCOPY WITH PROPOFOL;  Surgeon: Toledo, Benay Pike, MD;  Location: [facility];  Service: Gastroenterology;  Laterality: N/A;   ELECTROPHYSIOLOGIC STUDY N/A 02/01/2016   Procedure: CARDIOVERSION;  Surgeon:  [name], MD;  Location: [facility];  Service: Cardiovascular;  Laterality: N/A;   ESOPHAGEAL DILATION     ESOPHAGOGASTRODUODENOSCOPY (EGD) WITH PROPOFOL N/A 02/06/2017   Procedure: ESOPHAGOGASTRODUODENOSCOPY (EGD) WITH PROPOFOL;  Surgeon: [name], MD;  Location: [facility];  Service: Gastroenterology;  Laterality: N/A;    ESOPHAGOGASTRODUODENOSCOPY (EGD) WITH PROPOFOL N/A 08/28/2019   Procedure: ESOPHAGOGASTRODUODENOSCOPY (EGD) WITH PROPOFOL;  Surgeon: Toledo, Benay Pike, MD;  Location: [facility];  Service: Gastroenterology;  Laterality: N/A;   EUS N/A 02/16/2017   Procedure: FULL UPPER ENDOSCOPIC ULTRASOUND (EUS) RADIAL;  Surgeon: [name], MD;  Location: [facility];  Service: Gastroenterology;  Laterality: N/A;   LEFT HEART CATH AND CORONARY ANGIOGRAPHY N/A 09/08/2016   Procedure: Left Heart Cath and Coronary Angiography;  Surgeon: [name], MD;  Location: [place] [facility];  Service: Cardiovascular;  Laterality: N/A;   US ECHOCARDIOGRAPHY      Prior to Admission medications   Medication Sig Start Date End Date Taking? Authorizing Provider  albuterol (VENTOLIN HFA) 108 (90 Base) MCG/ACT inhaler Inhale 2 puffs into the lungs every 6 (six) hours as needed for wheezing or shortness of breath. 09/25/20   Cuthriell, Charline Bills, PA-C  apixaban (ELIQUIS) 5 MG TABS tablet Take 5 mg by mouth 2 (two) times daily.    [provider]  atorvastatin (LIPITOR) 20 MG tablet Take 20 mg by mouth every evening. 08/25/16   [provider]  azelastine (ASTELIN) 0.1 % nasal spray Place 2 sprays into both nostrils 2 (two) times daily as needed for rhinitis or allergies.  10/13/16   [provider]  budesonide-formoterol (SYMBICORT) 160-4.5 MCG/ACT inhaler Inhale 2 puffs into the lungs 2 (two) times daily.    [provider]  butalbital-acetaminophen-caffeine (FIORICET) 318 157 8659 MG tablet Take 1 tablet by mouth every 6 (six) hours as needed for headache. 09/25/20 09/25/21  Cuthriell, Charline Bills, PA-C  folic acid (FOLVITE) 1 MG tablet Take 1 mg by mouth daily.    [provider]  furosemide (LASIX) 20 MG tablet Take 20 mg by mouth daily as needed for fluid. 08/24/16   [provider]  guaiFENesin-dextromethorphan (ROBITUSSIN DM) 100-10 MG/5ML syrup Take 5 mLs by mouth  every 4 (four) hours as needed for cough. 10/14/20   Val Riles, MD  levothyroxine (SYNTHROID, LEVOTHROID) 50 MCG tablet Take 50 mcg by mouth daily before breakfast.    [provider]  ondansetron (ZOFRAN-ODT) 4 MG disintegrating tablet Take 1 tablet (4 mg total) by mouth every 8 (eight) hours as needed for nausea or vomiting. 09/25/20   Cuthriell, Charline Bills, PA-C  PARoxetine (PAXIL) 20 MG tablet Take 20 mg by mouth daily.    [provider]  pregabalin (LYRICA) 50 MG capsule Take 50 mg by mouth daily as needed for pain.    [provider]  ranitidine (ZANTAC) 150 MG tablet Take 150 mg by mouth 2 (two) times daily. 04/27/17   [provider]  traMADol (ULTRAM) 50 MG tablet Take 2 tablets (100 mg total) by mouth every 6 (six) hours as needed for moderate pain or severe pain. 08/23/19   Hinda Kehr, MD  triamcinolone ointment (KENALOG) 0.1 % Apply 1 application topically as directed. 05/09/18   [provider]    Allergies  Allergen Reactions   Codeine Hives, Itching and Rash   Other Itching and Other (See Comments)    States antibiotic in the past caused itching but can not remember name    Family History  Problem Relation Age of Onset   Breast cancer Paternal Aunt    Other Father    Heart attack Father     Social History Social History   Tobacco Use   Smoking status: Former    Types: Cigarettes    Quit date: 02/01/2013    Years since quitting: 8.0   Smokeless tobacco: Never  Vaping Use   Vaping Use: Never used  Substance Use Topics   Alcohol use: No   Drug use: No    Review of Systems Constitutional: Subjective fever.  Positive for generalized body aches and fatigue Cardiovascular: Negative for chest pain. Respiratory: Negative for shortness of breath.  Positive for cough congestion Gastrointestinal: Negative for abdominal pain.  Positive for nausea Musculoskeletal: Negative for musculoskeletal complaints Neurological:  Negative for headache All other ROS negative  ____________________________________________   PHYSICAL EXAM:  VITAL SIGNS: ED Triage Vitals  Enc Vitals Group     BP 02/09/21 1219 (!) 89/60     Pulse Rate 02/09/21 1219 78     Resp 02/09/21 1219 (!) 23     Temp 02/09/21 1219 99.2 F (37.3 C)     Temp Source 02/09/21 1219 Oral     SpO2 02/09/21 1219 96 %     Weight 02/09/21 1222 290 lb (131.5 kg)     Height 02/09/21 1222 5\' 4"  (1.626 m)     Head Circumference --      Peak Flow --      Pain Score 02/09/21 1226 8     Pain Loc --      Pain Edu? --      Excl. in [place]? --     Constitutional: Alert and oriented. Well appearing and in no distress. Eyes: Normal exam ENT      Head: Normocephalic and atraumatic.      Mouth/Throat: Mucous membranes are moist. Cardiovascular: Normal rate, regular rhythm.  Respiratory: Normal respiratory effort without tachypnea nor retractions.  Mild expiratory wheeze bilaterally, respiratory rate around 20.  Occasional cough on examination. Gastrointestinal: Soft and nontender. No distention.  Musculoskeletal: Nontender with normal range of motion in all extremities.  Mild tenderness of bilateral lower extremities which she states is chronic.  No significant edema Neurologic:  Normal speech and language. No gross focal neurologic deficits  Skin:  Skin is warm, dry and intact.  Psychiatric: Mood and affect are normal.  ____________________________________________    EKG  EKG viewed and interpreted by myself shows atrial fibrillation at 90 bpm with a slightly widened QRS, normal axis, largely normal intervals with nonspecific ST changes.  ____________________________________________    RADIOLOGY  Chest x-ray is clear  ____________________________________________   INITIAL IMPRESSION / ASSESSMENT AND PLAN / ED COURSE  Pertinent labs & imaging results that were available during my care of the patient were reviewed by me and considered in my  medical decision making (see chart for details).   Patient presents to the emergency department for cough congestion fatigue generalized body aches over the past 2 days.  Symptoms are very suggestive of upper respiratory infection/viral infection.  Patient initially somewhat hypotensive upon arrival however upon recheck patient's blood pressure is normal to hypertensive.  We will check labs including lactic acid, we will IV hydrate and obtain a COVID/flu swab as well as a chest x-ray and continue to closely monitor.  Patient is here with a family member with similar symptoms.  Chest x-ray is clear.  Patient's work-up is reassuring, swab has resulted positive for influenza A.  Patient's grandson who is also here with similar symptoms has resulted positive for influenza A as well.  As the symptoms have only been ongoing for 2 days and the patient we will discharge with Tamiflu have the patient follow-up with her doctor as needed.  Discussed supportive care and return precautions.  Meliya Mcconahy Sohail was evaluated in Emergency Department on 02/09/2021 for the symptoms described in the history of present illness. She was evaluated in the context of the global COVID-19 pandemic, which necessitated consideration that the patient might be at risk for infection with the SARS-CoV-2 virus that causes COVID-19. Institutional protocols and algorithms that pertain to the evaluation of patients at risk for COVID-19 are in a state of rapid change based on information released by regulatory bodies including the CDC and federal and state organizations. These policies and algorithms were followed during the patient's care in the ED.  ____________________________________________   FINAL CLINICAL IMPRESSION(S) / ED DIAGNOSES  Influenza A   Harvest Dark, MD 02/09/21 1410

## 2021-02-09 NOTE — ED Notes (Signed)
Patient verbalized understanding of discharges and had no further questions. Signature pad not available at hallway bedside.

## 2021-02-09 NOTE — ED Triage Notes (Signed)
Pt states that she started having cough, congestion , headache, generalized body aches, states everyone in her house is sick and coughing, grandkid is here also to be seen with her.

## 2021-02-09 NOTE — ED Notes (Signed)
See triage note, pt reports body aches, cough, headache that started yesterday. NAD noted

## 2021-02-16 NOTE — Unmapped (Signed)
[facility] Shared [facility] Clinical Intervention    Type of intervention: Medication on-hold    Medication involved: Taltz    Problem identified/ Intervention: Deshon called to ask about an interaction between Tamiflu and Taltz- we reviewed that while there's no an interaction, it's best to hold off on Taltz injections when she has an active, serious infection like the flu. She hasn't started on her medication yet (she had COVID prior to this). We discussed it's probably best to finish her treatment and to make sure she's been feeling better for a few days before beginning.      Follow-up needed: Will move [facility] call.     Approximate time spent: 0-5 minutes    Clinical evidence used to support intervention: Professional judgement    Chemical engineer Shared [facility] Specialty Pharmacist

## 2021-03-08 NOTE — Unmapped (Signed)
[name] reports lots of itching still along with fatigue - I recommended she follow up with PCP for repeat blood work. (WNL in Ropp 22).    She has seen a lot of improvement with her psoriasis.     Week 4 and 6 doses will be 12/11 and 12/25, respectively.      [facility] Shared [facility] Clinical Assessment & Refill Coordination Note    Lisa Carlson, DOB: 18-Endo-1947  Phone: [phone] (home)     All above HIPAA information was verified with patient.     Was a Nurse, learning disability used for this call? No    Specialty Medication(s):   Inflammatory Disorders: Taltz     Current Outpatient Medications   Medication Sig Dispense Refill   ??? albuterol (PROVENTIL HFA;VENTOLIN HFA) 90 mcg/actuation inhaler Inhale 2 puffs every six (6) hours as needed for wheezing.     ??? amoxicillin-clavulanate (AUGMENTIN) 875-125 mg per tablet amoxicillin 875 mg-potassium clavulanate 125 mg tablet     ??? apixaban (ELIQUIS) 5 mg Tab Take 5 mg by mouth Two (2) times a day.     ??? atorvastatin (LIPITOR) 20 MG tablet Take 20 mg by mouth daily.      ??? B-complex with vitamin C (TOTAL B W/C) tablet vitamin b complex with b12     ??? baclofen (LIORESAL) 10 MG tablet Take 10 mg by mouth Two (2) times a day.      ??? betamethasone, augmented, (DIPROLENE) 0.05 % ointment Apply to scaly rash twice daily until smooth, then stop 100 g 5   ??? calcium carbonate (OS-CAL) 500 mg calcium (1,250 mg) chewable tablet Chew 0.5 tablets daily as needed.      ??? camphor-menthoL (SARNA) 0.5-0.5 % lotion Apply as needed for itch 222 mL 3   ??? cetirizine (ZYRTEC) 10 MG tablet Take 10 mg by mouth daily.      ??? ciprofloxacin-dexamethasone (CIPRODEX) 0.3-0.1 % otic suspension Administer 1 drop into both ears daily as needed.     ??? clobetasol (TEMOVATE) 0.05 % cream Apply 1 application topically Two (2) times a day.     ??? clobetasoL (TEMOVATE) 0.05 % external solution Apply topically Two (2) times a day. 50 mL 3   ??? clobetasoL (TEMOVATE) 0.05 % ointment Apply 1 application topically Two (2) times a day. 120 g 1   ??? cyclobenzaprine (FLEXERIL) 10 MG tablet cyclobenzaprine 10 mg tablet     ??? diltiazem (TIAZAC) 120 MG 24 hr capsule diltiazem CD 120 mg capsule,extended release 24 hr     ??? empty container Misc Use as directed to dispose of Humira pens. 1 each 2   ??? esomeprazole (NEXIUM) 40 MG capsule Take 40 mg by mouth every morning before breakfast.     ??? fluconazole (DIFLUCAN) 100 MG tablet Take 100 mg by mouth daily.  0   ??? fluocinolone (SYNALAR) 0.01 % external solution fluocinolone 0.01 % topical solution     ??? fluocinolone acetonide oiL 0.01 % Drop Administer 1 drop into both ears Two (2) times a day. 20 mL 2   ??? fluticasone (FLONASE) 50 mcg/actuation nasal spray 2 sprays into each nostril daily.      ??? folic acid (FOLVITE) 1 MG tablet Take 5 tablets daily on non-methotrexate days. 150 tablet 5   ??? furosemide (LASIX) 20 MG tablet Take 20 mg by mouth Two (2) times a day.     ??? gabapentin (NEURONTIN) 300 MG capsule Take 300 mg by mouth Three (3) times a  day.     ??? hydroCHLOROthiazide (HYDRODIURIL) 25 MG tablet Take 25 mg by mouth daily.      ??? HYDROcodone-acetaminophen (NORCO) 5-325 mg per tablet hydrocodone 5 mg-acetaminophen 325 mg tablet     ??? hydrOXYzine (ATARAX) 25 MG tablet TAKE 1 TABLET BY MOUTH THREE TIMES A DAY AS NEEDED. 270 tablet 1   ??? ibuprofen (ADVIL,MOTRIN) 800 MG tablet Take 800 mg by mouth every six (6) hours as needed for pain.      ??? ipratropium (ATROVENT) 42 mcg (0.06 %) nasal spray 2 sprays into each nostril four (4) times a day.      ??? ivermectin (STROMECTOL) 3 mg Tab Take 9 tablets by mouth and repeat in one week 18 tablet 0   ??? ixekizumab (TALTZ AUTOINJECTOR) 80 mg/mL AtIn auto-injector Inject the contents of 1 pen (80 mg total) under the skin every twenty-eight (28) days. 1 mL 11   ??? ixekizumab (TALTZ AUTOINJECTOR) 80 mg/mL AtIn auto-injector Inject the contents of 2 pens (160mg ) under the skin on day 1, THEN inject 1 pen (80mg ) every 2 weeks for 12 weeks. THEN start maintenance dose. 8 mL 0   ??? levothyroxine 25 mcg cap Take 50 mg by mouth once.     ??? losartan-hydrochlorothiazide (HYZAAR) 50-12.5 mg per tablet losartan 50 mg-hydrochlorothiazide 12.5 mg tablet     ??? melatonin 10 mg Tab Take by mouth.      ??? meloxicam (MOBIC) 15 MG tablet meloxicam 15 mg tablet     ??? montelukast (SINGULAIR) 10 mg tablet Take 10 mg by mouth nightly.      ??? neomycin-bacitracin-polymyxin-hydrocortisone (CORTISPORIN) 3.5-400-10,000 mg-unit/g-1% ophthalmic ointment Two (2) times a day.     ??? neomycin-polymyxin-hydrocortisone (CORTISPORIN) 3.5-10,000-1 mg/mL-unit/mL-% otic solution neomycin-polymyxin-hydrocort 3.5 mg/mL-10,000 unit/mL-1 % ear solution     ??? nitroglycerin (NITROSTAT) 0.4 MG SL tablet Place 0.4 mg under the tongue every five (5) minutes as needed for chest pain.     ??? nystatin (MYCOSTATIN) powder      ??? nystatin-triamcinolone (MYCOLOG II) 100,000-0.1 unit/g-% cream Apply topically four (4) times a day. 30 g 3   ??? PARoxetine (PAXIL) 20 MG tablet Take 20 mg by mouth daily.      ??? PNV no.63-iron,carbonyl-FA-dha 27 mg iron- 800 mcg-200 mg cap Take 300 mg by mouth every morning before breakfast.      ??? pramoxine (SARNA SENSITIVE) 1 % Lotn Apply 1 application topically two (2) times a day. 222 mL 6   ??? predniSONE (DELTASONE) 5 MG tablet Take 5 mg by mouth Two (2) times a day.  0   ??? pregabalin (LYRICA) 50 MG capsule Take 50 mg by mouth daily as needed.      ??? promethazine (PHENERGAN) 25 MG tablet promethazine 25 mg tablet     ??? ranitidine (ZANTAC) 150 MG tablet Take 150 mg by mouth Two (2) times a day.     ??? ranolazine (RANEXA) 1,000 mg SR tablet Take 1,000 mg by mouth every morning before breakfast.      ??? silver sulfaDIAZINE (SILVADENE, SSD) 1 % cream      ??? SPIRIVA RESPIMAT 1.25 mcg/actuation Mist USE AS DIRECTED 1 INHALATION EVERY DAY     ??? SYMBICORT 160-4.5 mcg/actuation inhaler INHALE 1 PUFF ORALLY EVERY 12 HOURS, RINSE MOUTH WELL AFTER EACH USELRH  1   ??? traZODone (DESYREL) 100 MG tablet Take 100 mg by mouth nightly.      ??? triamcinolone (KENALOG) 0.1 % ointment APPLY TO AFFECTED AREA TWICE  A DAY 454 g 0   ??? triamcinolone (KENALOG) 0.1 % ointment Apply a thick layer twice a day to psoriasis until smooth, then stop 454 g 3     No current facility-administered medications for this visit.        Changes to medications: Lisa Carlson reports no changes at this time.    Allergies   Allergen Reactions   ??? Codeine Itching, Hives, Other (See Comments) and Rash       Changes to allergies: No    SPECIALTY MEDICATION ADHERENCE     Taltz - 0 left  Medication Adherence    Patient reported X missed doses in the last month: 0  Specialty Medication: Taltz          Specialty medication(s) dose(s) confirmed: Regimen is correct and unchanged.     Are there any concerns with adherence? No    Adherence counseling provided? Not needed    CLINICAL MANAGEMENT AND INTERVENTION      Clinical Benefit Assessment:    Do you feel the medicine is effective or helping your condition? Yes    Clinical Benefit counseling provided? Not needed    Adverse Effects Assessment:    Are you experiencing any side effects? No    Are you experiencing difficulty administering your medicine? No    Quality of Life Assessment:    Quality of Life    Rheumatology  Oncology  Dermatology  1. What impact has your specialty medication had on the symptoms of your skin condition (i.e. itchiness, soreness, stinging)?: Minimal  2. What impact has your specialty medication had on your comfort level with your skin?: Minimal  Cystic Fibrosis          Have you discussed this with your provider? Not needed    Acute Infection Status:    Acute infections noted within Epic:  No active infections  Patient reported infection: None    Therapy Appropriateness:    Is therapy appropriate and patient progressing towards therapeutic goals? Yes, therapy is appropriate and should be continued    DISEASE/MEDICATION-SPECIFIC INFORMATION      For patients on injectable medications: Patient currently has 0 doses left.  Next injection is scheduled for 12/11.    PATIENT SPECIFIC NEEDS     - Does the patient have any physical, cognitive, or cultural barriers? No    - Is the patient high risk? No    - Does the patient require a Care Management Plan? No     - Does the patient require physician intervention or other additional services (i.e. nutrition, smoking cessation, social work)? No      SHIPPING     Specialty Medication(s) to be Shipped:   Inflammatory Disorders: Taltz    Other medication(s) to be shipped: No additional medications requested for fill at this time     Changes to insurance: No    Delivery Scheduled: Yes, Expected medication delivery date: Thurs, 12/1.     Medication will be delivered via Same Day Courier to the confirmed prescription address in [facility].    The patient will receive a drug information handout for each medication shipped and additional FDA Medication Guides as required.  Verified that patient has previously received a Conservation officer, historic buildings and a Surveyor, mining.    The patient or caregiver noted above participated in the development of this care plan and knows that they can request review of or adjustments to the care plan at any time.  All of the patient's questions and concerns have been addressed.    Lanney Gins   [facility] Shared [facility] Specialty Pharmacist

## 2021-03-11 MED FILL — TALTZ AUTOINJECTOR 80 MG/ML SUBCUTANEOUS: 28 days supply | Qty: 2 | Fill #1

## 2021-03-22 DIAGNOSIS — E782 Mixed hyperlipidemia: Secondary | ICD-10-CM | POA: Diagnosis not present

## 2021-03-22 DIAGNOSIS — M6281 Muscle weakness (generalized): Secondary | ICD-10-CM | POA: Diagnosis not present

## 2021-03-22 DIAGNOSIS — E669 Obesity, unspecified: Secondary | ICD-10-CM | POA: Diagnosis not present

## 2021-03-22 DIAGNOSIS — G43009 Migraine without aura, not intractable, without status migrainosus: Secondary | ICD-10-CM | POA: Diagnosis not present

## 2021-03-22 DIAGNOSIS — N390 Urinary tract infection, site not specified: Secondary | ICD-10-CM | POA: Diagnosis not present

## 2021-03-22 DIAGNOSIS — H60391 Other infective otitis externa, right ear: Secondary | ICD-10-CM | POA: Diagnosis not present

## 2021-03-22 DIAGNOSIS — I10 Essential (primary) hypertension: Secondary | ICD-10-CM | POA: Diagnosis not present

## 2021-03-22 DIAGNOSIS — G4733 Obstructive sleep apnea (adult) (pediatric): Secondary | ICD-10-CM | POA: Diagnosis not present

## 2021-03-25 ENCOUNTER — Ambulatory Visit: Payer: Medicare HMO | Admitting: Internal Medicine

## 2021-03-25 NOTE — Unmapped (Signed)
Patient LM on nurse line stating she began Taltz last month and now she has a rash on back of neck that is very itchy.  Wants to know if could be a side effect from medication or, if not, could she have something for the itchiness.

## 2021-03-26 MED ORDER — TRIAMCINOLONE ACETONIDE 0.1 % TOPICAL OINTMENT
Freq: Two times a day (BID) | 0 refills | 0.00000 days
Start: 2021-03-26 — End: ?

## 2021-03-27 NOTE — Unmapped (Signed)
Called patient per Dr Odis Luster and informed her to try triamcinolone ointment on the rash first (patient does need more)  Patient states it is more the itching that is bothering her than the actual rash.  Informed her that the triamcinolone ointment should calm the itching as well.  Informed her that if this does not help or improve the rash , to give [place] a call back and will try to get her in after the holidays for evaluation.  Also informed patient she can continue to take the Taltz for now.    Patient appreciated the call back.

## 2021-03-29 MED ORDER — TRIAMCINOLONE ACETONIDE 0.1 % TOPICAL OINTMENT
Freq: Two times a day (BID) | TOPICAL | 1 refills | 0.00000 days | Status: CP
Start: 2021-03-29 — End: ?

## 2021-03-29 NOTE — Unmapped (Signed)
Thank you! I approved the triamcinolone refill to [facility] in [facility]

## 2021-04-01 NOTE — Unmapped (Signed)
[facility] Refill Coordination Note    Specialty Medication(s) to be Shipped:   Inflammatory Disorders: Taltz    Other medication(s) to be shipped: No additional medications requested for fill at this time     Lisa Carlson, DOB: 1945/05/09  Phone: [phone] (home)       All above HIPAA information was verified with patient.     Was a Nurse, learning disability used for this call? No    Completed refill call assessment today to schedule patient's medication shipment from the [facility] [phone]).  All relevant notes have been reviewed.     Specialty medication(s) and dose(s) confirmed: Regimen is correct and unchanged.   Changes to medications: Wyndi reports no changes at this time.  Changes to insurance: No  New side effects reported not previously addressed with a pharmacist or physician: None reported  Questions for the pharmacist: No    Confirmed patient received a Conservation officer, historic buildings and a Surveyor, mining with first shipment. The patient will receive a drug information handout for each medication shipped and additional FDA Medication Guides as required.       DISEASE/MEDICATION-SPECIFIC INFORMATION        For patients on injectable medications: Patient currently has 1  doses left.  Next injection is scheduled for 12/27 (week 6).    SPECIALTY MEDICATION ADHERENCE     Medication Adherence    Patient reported X missed doses in the last month: 0  Specialty Medication: TALTZ AUTOINJECTOR 80 mg/mL  Patient is on additional specialty medications: No  Any gaps in refill history greater than 2 weeks in the last 3 months: no  Demonstrates understanding of importance of adherence: yes  Informant: patient  Reliability of informant: reliable  Confirmed plan for next specialty medication refill: delivery by pharmacy  Refills needed for supportive medications: not needed              Were doses missed due to medication being on hold? No    Taltz 80 mg/ml: 14 days of medicine on hand REFERRAL TO PHARMACIST     Referral to the pharmacist: Not needed      [facility]     Shipping address confirmed in Epic.     Delivery Scheduled: Yes, Expected medication delivery date: 04/09/2021.     Medication will be delivered via Same Day Courier to the prescription address in Epic WAM.    Bekim Werntz D Kollen Armenti   [facility] Shared [facility] Specialty Technician

## 2021-04-07 DIAGNOSIS — J069 Acute upper respiratory infection, unspecified: Secondary | ICD-10-CM | POA: Diagnosis not present

## 2021-04-07 DIAGNOSIS — Z20822 Contact with and (suspected) exposure to covid-19: Secondary | ICD-10-CM | POA: Diagnosis not present

## 2021-04-09 MED FILL — TALTZ AUTOINJECTOR 80 MG/ML SUBCUTANEOUS: 28 days supply | Qty: 2 | Fill #2

## 2021-04-14 ENCOUNTER — Ambulatory Visit: Admit: 2021-04-14 | Discharge: 2021-04-15 | Payer: MEDICARE

## 2021-04-14 DIAGNOSIS — R059 Cough, unspecified type: Principal | ICD-10-CM

## 2021-04-14 DIAGNOSIS — R0600 Dyspnea, unspecified: Principal | ICD-10-CM

## 2021-04-14 DIAGNOSIS — R0602 Shortness of breath: Secondary | ICD-10-CM | POA: Diagnosis not present

## 2021-04-30 DIAGNOSIS — L409 Psoriasis, unspecified: Principal | ICD-10-CM

## 2021-04-30 MED ORDER — TALTZ AUTOINJECTOR 80 MG/ML SUBCUTANEOUS
SUBCUTANEOUS | 0 refills | 0.00000 days | Status: CP
Start: 2021-04-30 — End: ?
  Filled 2021-05-14: qty 1, 28d supply, fill #0

## 2021-04-30 NOTE — Unmapped (Signed)
Pt last seen 01/19/21  requesting medication refill  Medication pended

## 2021-04-30 NOTE — Unmapped (Signed)
[name] Refill Coordination Note    Specialty Medication(s) to be Shipped:   Inflammatory Disorders: Taltz 80mg /ml Inj  Other medication(s) to be shipped: No additional medications requested for fill at this time     Lisa Carlson, DOB: 06/17/1945  Phone: [phone] (home)     All above HIPAA information was verified with patient.     Was a Nurse, learning disability used for this call? No    Completed refill call assessment today to schedule patient's medication shipment from the [facility] [phone]).  All relevant notes have been reviewed.     Specialty medication(s) and dose(s) confirmed: Regimen is correct and unchanged.   Changes to medications: Lisa Carlson reports no changes at this time.  Changes to insurance: No  New side effects reported not previously addressed with a pharmacist or physician: None reported  Questions for the pharmacist: No    Confirmed patient received a Conservation officer, historic buildings and a Surveyor, mining with first shipment. The patient will receive a drug information handout for each medication shipped and additional FDA Medication Guides as required.       DISEASE/MEDICATION-SPECIFIC INFORMATION        For patients on injectable medications: Patient currently has 1 doses left.  Next injection is scheduled for 05/04/2021.    SPECIALTY MEDICATION ADHERENCE     Medication Adherence    Patient reported X missed doses in the last month: 0  Specialty Medication: Taltz 80mg /ml Inj  Patient is on additional specialty medications: No  Patient is on more than two specialty medications: No  Informant: patient  Reliability of informant: reliable  Reasons for non-adherence: no problems identified      Were doses missed due to medication being on hold? No    Taltz 80mg /ml Inj:14 days of medicine on hand     REFERRAL TO PHARMACIST     Referral to the pharmacist: Not needed    [facility]     Shipping address confirmed in Epic.     Delivery Scheduled: Yes, Expected medication delivery date: 05/14/2021.     Medication will be delivered via Same Day Courier to the prescription address in Epic WAM.    Lisa Carlson P [name]   [facility] Shared [facility] Specialty Technician

## 2021-05-04 DIAGNOSIS — E559 Vitamin D deficiency, unspecified: Secondary | ICD-10-CM | POA: Diagnosis not present

## 2021-05-04 DIAGNOSIS — H8113 Benign paroxysmal vertigo, bilateral: Secondary | ICD-10-CM | POA: Diagnosis not present

## 2021-05-04 DIAGNOSIS — G4733 Obstructive sleep apnea (adult) (pediatric): Secondary | ICD-10-CM | POA: Diagnosis not present

## 2021-05-04 DIAGNOSIS — E039 Hypothyroidism, unspecified: Secondary | ICD-10-CM | POA: Diagnosis not present

## 2021-05-04 DIAGNOSIS — M5136 Other intervertebral disc degeneration, lumbar region: Secondary | ICD-10-CM | POA: Diagnosis not present

## 2021-05-04 DIAGNOSIS — E782 Mixed hyperlipidemia: Secondary | ICD-10-CM | POA: Diagnosis not present

## 2021-05-04 DIAGNOSIS — I10 Essential (primary) hypertension: Secondary | ICD-10-CM | POA: Diagnosis not present

## 2021-05-04 DIAGNOSIS — M5030 Other cervical disc degeneration, unspecified cervical region: Secondary | ICD-10-CM | POA: Diagnosis not present

## 2021-05-04 DIAGNOSIS — E669 Obesity, unspecified: Secondary | ICD-10-CM | POA: Diagnosis not present

## 2021-05-04 DIAGNOSIS — R7303 Prediabetes: Secondary | ICD-10-CM | POA: Diagnosis not present

## 2021-05-17 NOTE — Unmapped (Signed)
Call to patient to pre-room. Patient states she will not make it to the appointment, she needs to cancel and she will call back to reschedule.    Patient would like Dr. Odis Luster to know the [place] Oiler has been very helpful. Patient has not had any side effects and her skin has cleared up very well.

## 2021-05-17 NOTE — Unmapped (Unsigned)
Dermatology Note     Assessment and Plan:      Suspected Psoriasis, although with atypical features and having failed multiple biologics >30% BSA involved  - Patient has had multiple biopsies in the past, most recently in 02/2020 which revealed psoriasiform epidermal hyperplasia with parakeratosis, favoring psoriasis.  - Clinical presentation and biopsy findings have supported a diagnosis of psoriasis. However, patient has more pruritus than most patients with psoriasis and has failed multiple biologics (Tremfya, Cosentyx, Skyrizi, Cimzia). She has preferred to avoid [name] because of history of depression. She was inadequately controlled with methotrexate in the past, and switched back to Humira which she thought worked best in the past. However, she was badly flared on Humira last visit. Patient has not been taking Humira for several months and did not start methotrexate due to COVID-19 hospitalizations with [facility].   - Discussed treatment options including Taltz vs methotrexate vs   - Proceed as below:  - Continue hydroxyzine 25mg  by mouth as needed for itching, caution sedation. Take one at bedtime and increase to 3 times daily if needed for itching and if it doesn't make you too sleepy.  - Continue clobetasol BID PRN itch   - Plan to start Taltz for now, will ask insurance about restarting Humira if not approved for Taltz.   - Start ixekizumab (TALTZ AUTOINJECTOR) 80 mg/mL AtIn auto-injector; Inject 80mg  subcutaneously every 4 weeks  - Start ixekizumab (TALTZ AUTOINJECTOR) 80 mg/mL AtIn auto-injector; Inject 160mg  subcutaneously x1, then 80mg  every 2 weeks x 12 weeks then start maintenance dose      There are no diagnoses linked to this encounter.    The patient was advised to call for an appointment should any new, changing, or symptomatic lesions develop.     RTC: No follow-ups on file. or sooner as needed   _________________________________________________________________      Chief Complaint     No chief complaint on file.      HPI     Lisa Carlson is a 76 y.o. female who presents as a returning patient (last seen by Dr. Odis Luster on 01/19/2021) to [facility] for follow up of psoriasis. At last visit, patient was recommended to start Taltz injections and to continue hydroxyzine 25 mg prn for itching and clobetasol BID as needed.    The patient denies any other new or changing lesions or areas of concern.     Pertinent Past Medical History     No history of skin cancer    Family History:   Negative for melanoma    Past Medical History, Family History, Social History, Medication List, Allergies, and Problem List were reviewed in the rooming section of Epic.     ROS: Other than symptoms mentioned in the HPI, no fevers, chills, or other skin complaints    Physical Examination     GENERAL: Well-appearing female in no acute distress, resting comfortably.  NEURO: Alert and oriented, answers questions appropriately  PSYCH: Normal mood and affect  {PE extent:75514}  {PE list:75421}  ***    All areas not commented on are within normal limits or unremarkable    Scribe's Attestation: [name], MD obtained and performed the history, physical exam and medical decision making elements that were entered into the chart. Signed by Hazle Nordmann, Scribe, on ***.    {*** NOTE TO PROVIDER: PLEASE ADD ATTESTATION NOTING YOU AGREE WITH SCRIBE DOCUMENTATION}     (Approved Template 12/23/2019)

## 2021-05-18 NOTE — Unmapped (Signed)
Patient will call back to reschedule. Thanks

## 2021-06-07 NOTE — Unmapped (Signed)
[facility] Refill Coordination Note    Specialty Medication(s) to be Shipped:   Inflammatory Disorders: Taltz 80mg /ml Inj     Other medication(s) to be shipped: No additional medications requested for fill at this time     Lisa Carlson Rash, DOB: 1945-10-24  Phone: [phone] (home)     All above HIPAA information was verified with patient.     Was a Nurse, learning disability used for this call? No    Completed refill call assessment today to schedule patient's medication shipment from the [facility] [phone]).  All relevant notes have been reviewed.     Specialty medication(s) and dose(s) confirmed: Regimen is correct and unchanged.   Changes to medications: Beau reports no changes at this time.  Changes to insurance: No  New side effects reported not previously addressed with a pharmacist or physician: None reported  Questions for the pharmacist: No    Confirmed patient received a Conservation officer, historic buildings and a Surveyor, mining with first shipment. The patient will receive a drug information handout for each medication shipped and additional FDA Medication Guides as required.       DISEASE/MEDICATION-SPECIFIC INFORMATION        For patients on injectable medications: Patient currently has 0 doses left.  Next injection is scheduled for 06/15/2021.    SPECIALTY MEDICATION ADHERENCE     Medication Adherence    Patient reported X missed doses in the last month: 0  Specialty Medication: Taltz  Patient is on additional specialty medications: No  Any gaps in refill history greater than 2 weeks in the last 3 months: no  Demonstrates understanding of importance of adherence: yes  Informant: patient  Reliability of informant: reliable  Confirmed plan for next specialty medication refill: delivery by pharmacy  Refills needed for supportive medications: not needed      Were doses missed due to medication being on hold? No    Taltz 80mg /ml Inj: 0 days of medicine on hand     REFERRAL TO PHARMACIST     Referral to the pharmacist: Not needed    [facility]     Shipping address confirmed in Epic.     Delivery Scheduled: Yes, Expected medication delivery date: 06/10/2021.     Medication will be delivered via Same Day Courier to the prescription address in Epic WAM.    Dealva Lafoy D Lanell Carpenter   [facility] Shared [facility] Specialty Technician

## 2021-06-08 ENCOUNTER — Emergency Department: Payer: Medicare HMO

## 2021-06-08 ENCOUNTER — Emergency Department
Admission: EM | Admit: 2021-06-08 | Discharge: 2021-06-08 | Disposition: A | Discharge status: Home / Self Care | Payer: Medicare HMO | Attending: Emergency Medicine | Admitting: Emergency Medicine

## 2021-06-08 ENCOUNTER — Encounter: Payer: Self-pay | Admitting: *Deleted

## 2021-06-08 ENCOUNTER — Other Ambulatory Visit: Payer: Self-pay

## 2021-06-08 DIAGNOSIS — R42 Dizziness and giddiness: Secondary | ICD-10-CM | POA: Insufficient documentation

## 2021-06-08 DIAGNOSIS — I10 Essential (primary) hypertension: Secondary | ICD-10-CM | POA: Diagnosis not present

## 2021-06-08 DIAGNOSIS — W19XXXA Unspecified fall, initial encounter: Secondary | ICD-10-CM | POA: Insufficient documentation

## 2021-06-08 DIAGNOSIS — R0602 Shortness of breath: Secondary | ICD-10-CM | POA: Diagnosis not present

## 2021-06-08 DIAGNOSIS — I517 Cardiomegaly: Secondary | ICD-10-CM | POA: Diagnosis not present

## 2021-06-08 DIAGNOSIS — R079 Chest pain, unspecified: Secondary | ICD-10-CM | POA: Insufficient documentation

## 2021-06-08 LAB — CBC
HCT: 45.5 % (ref 36.0–46.0)
Hemoglobin: 14.6 g/dL (ref 12.0–15.0)
MCH: 27.1 pg (ref 26.0–34.0)
MCHC: 32.1 g/dL (ref 30.0–36.0)
MCV: 84.6 fL (ref 80.0–100.0)
Platelets: 320 10*3/uL (ref 150–400)
RBC: 5.38 MIL/uL — ABNORMAL HIGH (ref 3.87–5.11)
RDW: 13.7 % (ref 11.5–15.5)
WBC: 7.8 10*3/uL (ref 4.0–10.5)
nRBC: 0 % (ref 0.0–0.2)

## 2021-06-08 LAB — BASIC METABOLIC PANEL
Anion gap: 12 (ref 5–15)
BUN: 22 mg/dL (ref 8–23)
CO2: 25 mmol/L (ref 22–32)
Calcium: 9.6 mg/dL (ref 8.9–10.3)
Chloride: 99 mmol/L (ref 98–111)
Creatinine, Ser: 1.11 mg/dL — ABNORMAL HIGH (ref 0.44–1.00)
GFR, Estimated: 52 mL/min — ABNORMAL LOW (ref >60)
Glucose, Bld: 179 mg/dL — ABNORMAL HIGH (ref 70–99)
Potassium: 4.1 mmol/L (ref 3.5–5.1)
Sodium: 136 mmol/L (ref 135–145)

## 2021-06-08 LAB — TROPONIN I (HIGH SENSITIVITY)
Troponin I (High Sensitivity): 11 ng/L (ref <18)
Troponin I (High Sensitivity): 10 ng/L (ref <18)

## 2021-06-08 LAB — PROTIME-INR
INR: 1 (ref 0.8–1.2)
Prothrombin Time: 13.5 seconds (ref 11.4–15.2)

## 2021-06-08 MED ORDER — DIAZEPAM 2 MG PO TABS: 2.0000 mg | ORAL_TABLET | Freq: Three times a day (TID) | ORAL | 0 refills | Status: AC | PRN

## 2021-06-08 MED ORDER — DIAZEPAM 2 MG PO TABS
2.0000 mg | ORAL_TABLET | Freq: Once | ORAL | Status: AC
Start: 2021-06-08 — End: 2021-06-08
  Administered 2021-06-08: 2 mg via ORAL
  Filled 2021-06-08: qty 1

## 2021-06-08 NOTE — ED Notes (Signed)
See triage note. Pt reports recent issues with vertigo; has been seeing primary about this and is to see the ear doctor soon. Reports got very dizzy today right before she fell because she leaned over to get laundry out of the drier. Pt reports mild HA and reports intermittent CP. Resp reg/unlabored, skin dry, palpitations per pt, laying calmly on stretcher. Pt's grandson at bedside. Pt denies having hit head when she fell or LOC. Pt has rip to skin at L fa; dried blood noted. Stretcher locked low; rail up; call bell within reach. Pt reports that recently a therapist was at her house and tried to reset the crystals in her ear which caused her to nearly vomit.

## 2021-06-08 NOTE — ED Notes (Signed)
Skin tear at pt's L fa from fall covered with non-adhesive dressing. Pt educated on s/s on infection.

## 2021-06-08 NOTE — Discharge Instructions (Signed)
Please seek medical attention for any high fevers, chest pain, shortness of breath, change in behavior, persistent vomiting, bloody stool or any other new or concerning symptoms.  

## 2021-06-08 NOTE — ED Triage Notes (Signed)
Pt reports dizziness and falling this am.  Hx afib., vertigo.  Pt has back pain, left arm abrasion.  Pt has chest pain and sob.  Pt alert  speech clear.

## 2021-06-08 NOTE — ED Provider Notes (Incomplete)
[facility] Provider Note    Event Date/Time   First MD Initiated Contact with Patient 06/08/21 2017     (approximate)   History   Dizziness, Fall, and Chest Pain   HPI  Patricia Walker is a 76 y.o. female  who, per office visit note dated 03/22/21 has history of migraines, HTN, HLD, who presents to the emergency department today because of concern for vertigo and a fall. The patient states that she has been dealing with vertigo for the past roughly 3 months.  Today she had a very bad episode after bending down to get something from her appliance.  She states when she stood up she felt like everything was spinning.  Did not hit her arm and states if nothing was behind her she thinks she would have fallen down.  She does state that she has tried medication for vertigo in the past with out any relief.  Additionally she has had a physical therapist come to her house to perform Epley maneuvers which have temporarily helped.  She states she has a scheduled appointment upcoming with ENT in roughly 1 week.   Physical Exam   Triage Vital Signs: ED Triage Vitals  Enc Vitals Group     BP 06/08/21 1736 138/85     Pulse Rate 06/08/21 1736 93     Resp 06/08/21 1736 19     Temp 06/08/21 1736 99.1 F (37.3 C)     Temp Source 06/08/21 1736 Oral     SpO2 06/08/21 1736 99 %     Weight 06/08/21 1737 300 lb (136.1 kg)     Height 06/08/21 1737 5\' 4"  (1.626 m)     Head Circumference --      Peak Flow --      Pain Score 06/08/21 1737 7     Pain Loc --      Pain Edu? --      Excl. in [place]? --     Most recent vital signs: Vitals:   06/08/21 1947 06/08/21 2003  BP: 137/90   Pulse: 60 (!) 54  Resp: 18 16  Temp:    SpO2: 97% 97%    General: Awake, no distress.  CV:  Good peripheral perfusion.  Resp:  Normal effort.  Abd:  No distention.  Skin:  Skin tear to distal left forearm.   ED Results / Procedures / Treatments   Labs (all labs ordered are listed, but only  abnormal results are displayed) Labs Reviewed  BASIC METABOLIC PANEL - Abnormal; Notable for the following components:      Result Value   Glucose, Bld 179 (*)    Creatinine, Ser 1.11 (*)    GFR, Estimated 52 (*)    All other components within normal limits  CBC - Abnormal; Notable for the following components:   RBC 5.38 (*)    All other components within normal limits  PROTIME-INR  TROPONIN I (HIGH SENSITIVITY)  TROPONIN I (HIGH SENSITIVITY)     EKG  I, Nance Pear, attending physician, personally viewed and interpreted this EKG  EKG Time: 1739 Rate: 72 Rhythm: atrial fibrillation Axis: left axis deviation Intervals: qtc 444 QRS: q waves v1, LVH ST changes: no st elevation Impression: abnormal ekg    RADIOLOGY CXR I independently interpreted and visualized the CXR. My interpretation: cardiomegaly. No pneumonia Radiology interpretation:  IMPRESSION:  Cardiomegaly.  No active disease.       {You MUST document your own interpretation of imaging,  as well as the fact that you reviewed the radiologist's report!:1}   PROCEDURES:  Critical Care performed: {CriticalCareYesNo:19197::"Yes, see critical care procedure note(s)","No"}  Procedures   MEDICATIONS ORDERED IN ED: Medications - No data to display   IMPRESSION / MDM / [place] / ED COURSE  I reviewed the triage vital signs and the nursing notes.                              Differential diagnosis includes, but is not limited to, ***  {If the patient is on the monitor, remove the brackets and asterisks on the sentence below and remember to document it as a Procedure as well. Otherwise delete the sentence below:1} {**The patient is on the cardiac monitor to evaluate for evidence of arrhythmia and/or significant heart rate changes.**} {Remember to include, when applicable, any/all of the following data: independent review of imaging independent review of labs (comment specifically on  pertinent positives and negatives) review of specific prior hospitalizations, PCP/specialist notes, etc. discuss meds given and prescribed document any discussion with consultants (including hospitalists) any clinical decision tools you used and why (PECARN, NEXUS, etc.) did you consider admitting the patient? document social determinants of health affecting patient's care (homelessness, inability to follow up in a timely fashion, etc) document any pre-existing conditions increasing risk on current visit (e.g. diabetes and HTN increasing danger of high-risk chest pain/ACS) describes what meds you gave (especially parenteral) and why any other interventions?:1}     FINAL CLINICAL IMPRESSION(S) / ED DIAGNOSES   Final diagnoses:  None     Rx / DC Orders   ED Discharge Orders     None        Note:  This document was prepared using Dragon voice recognition software and Xiong include unintentional dictation errors.

## 2021-06-08 NOTE — ED Provider Notes (Signed)
[facility] Provider Note    Event Date/Time   First MD Initiated Contact with Patient 06/08/21 2017     (approximate)   History   Dizziness, Fall, and Chest Pain   HPI  Patricia Walker is a 76 y.o. female  who, per office visit note dated 03/22/21 has history of migraines, HTN, HLD, who presents to the emergency department today because of concern for vertigo and a fall. The patient states that she has been dealing with vertigo for the past roughly 3 months.  Today she had a very bad episode after bending down to get something from her appliance.  She states when she stood up she felt like everything was spinning.  Did not hit her arm and states if nothing was behind her she thinks she would have fallen down.  She does state that she has tried medication for vertigo in the past with out any relief.  Additionally she has had a physical therapist come to her house to perform Epley maneuvers which have temporarily helped.  She states she has a scheduled appointment upcoming with ENT in roughly 1 week.   Physical Exam   Triage Vital Signs: ED Triage Vitals  Enc Vitals Group     BP 06/08/21 1736 138/85     Pulse Rate 06/08/21 1736 93     Resp 06/08/21 1736 19     Temp 06/08/21 1736 99.1 F (37.3 C)     Temp Source 06/08/21 1736 Oral     SpO2 06/08/21 1736 99 %     Weight 06/08/21 1737 300 lb (136.1 kg)     Height 06/08/21 1737 5\' 4"  (1.626 m)     Head Circumference --      Peak Flow --      Pain Score 06/08/21 1737 7     Pain Loc --      Pain Edu? --      Excl. in [employer]? --     Most recent vital signs: Vitals:   06/08/21 2145 06/08/21 2200  BP:  108/83  Pulse:  (!) 58  Resp: 17   Temp:    SpO2:  97%    General: Awake, no distress.  CV:  Good peripheral perfusion.  Resp:  Normal effort.  Abd:  No distention.  Skin:  Skin tear to distal left forearm.   ED Results / Procedures / Treatments   Labs (all labs ordered are listed, but only abnormal  results are displayed) Labs Reviewed  BASIC METABOLIC PANEL - Abnormal; Notable for the following components:      Result Value   Glucose, Bld 179 (*)    Creatinine, Ser 1.11 (*)    GFR, Estimated 52 (*)    All other components within normal limits  CBC - Abnormal; Notable for the following components:   RBC 5.38 (*)    All other components within normal limits  PROTIME-INR  TROPONIN I (HIGH SENSITIVITY)  TROPONIN I (HIGH SENSITIVITY)     EKG  I, Nance Pear, attending physician, personally viewed and interpreted this EKG  EKG Time: 1739 Rate: 72 Rhythm: atrial fibrillation Axis: left axis deviation Intervals: qtc 444 QRS: q waves v1, LVH ST changes: no st elevation Impression: abnormal ekg    RADIOLOGY CXR I independently interpreted and visualized the CXR. My interpretation: cardiomegaly. No pneumonia Radiology interpretation:  IMPRESSION:  Cardiomegaly.  No active disease.       PROCEDURES:  Critical Care performed: No  Procedures  MEDICATIONS ORDERED IN ED: Medications  diazepam (VALIUM) tablet 2 mg (2 mg Oral Given 06/08/21 2123)     IMPRESSION / MDM / ASSESSMENT AND PLAN / ED COURSE  I reviewed the triage vital signs and the nursing notes.                              Differential diagnosis includes, but is not limited to, vertigo, CVA, anemia, electrolyte abnormality.    Patient presented to the emergency department today because of concerns for vertigo.  Patient does describe vertiginous symptoms.  They do improve with rest.  States she had been put on medication previously that did not help.  She was given Valium here in the emergency department did have some improvement of her symptoms.  Blood work without any concerning anemia.  Very mild elevation of the creatinine however patient has had this in the past.  Patient states she already has follow-up with ENT scheduled.  Will plan on discharging with prescription for valium to help with  symptoms.     FINAL CLINICAL IMPRESSION(S) / ED DIAGNOSES   Final diagnoses:  Vertigo     Rx / DC Orders   ED Discharge Orders          Ordered    diazepam (VALIUM) 2 MG tablet  Every 8 hours PRN        06/08/21 2214             Note:  This document was prepared using Dragon voice recognition software and [place] include unintentional dictation errors.    Nance Pear, MD 06/08/21 2216

## 2021-06-08 NOTE — ED Notes (Signed)
Pt reports is feeling significantly better than when she first arrived. Grandson remains at bedside.

## 2021-06-08 NOTE — ED Notes (Signed)
Pt taken out to vehicle using wheelchair.

## 2021-06-10 MED FILL — TALTZ AUTOINJECTOR 80 MG/ML SUBCUTANEOUS: SUBCUTANEOUS | 28 days supply | Qty: 1 | Fill #0

## 2021-06-25 DIAGNOSIS — S51802A Unspecified open wound of left forearm, initial encounter: Secondary | ICD-10-CM | POA: Diagnosis not present

## 2021-06-25 DIAGNOSIS — E782 Mixed hyperlipidemia: Secondary | ICD-10-CM | POA: Diagnosis not present

## 2021-06-25 DIAGNOSIS — I2510 Atherosclerotic heart disease of native coronary artery without angina pectoris: Secondary | ICD-10-CM | POA: Diagnosis not present

## 2021-06-25 DIAGNOSIS — R0602 Shortness of breath: Secondary | ICD-10-CM | POA: Diagnosis not present

## 2021-06-25 DIAGNOSIS — I340 Nonrheumatic mitral (valve) insufficiency: Secondary | ICD-10-CM | POA: Diagnosis not present

## 2021-06-25 DIAGNOSIS — I4891 Unspecified atrial fibrillation: Secondary | ICD-10-CM | POA: Diagnosis not present

## 2021-06-25 DIAGNOSIS — R079 Chest pain, unspecified: Secondary | ICD-10-CM | POA: Diagnosis not present

## 2021-06-25 DIAGNOSIS — G4733 Obstructive sleep apnea (adult) (pediatric): Secondary | ICD-10-CM | POA: Diagnosis not present

## 2021-06-25 DIAGNOSIS — R002 Palpitations: Secondary | ICD-10-CM | POA: Diagnosis not present

## 2021-06-25 DIAGNOSIS — S50812A Abrasion of left forearm, initial encounter: Secondary | ICD-10-CM | POA: Diagnosis not present

## 2021-06-25 DIAGNOSIS — H6123 Impacted cerumen, bilateral: Secondary | ICD-10-CM | POA: Diagnosis not present

## 2021-06-25 DIAGNOSIS — I10 Essential (primary) hypertension: Secondary | ICD-10-CM | POA: Diagnosis not present

## 2021-06-25 DIAGNOSIS — E669 Obesity, unspecified: Secondary | ICD-10-CM | POA: Diagnosis not present

## 2021-06-25 DIAGNOSIS — R42 Dizziness and giddiness: Secondary | ICD-10-CM | POA: Diagnosis not present

## 2021-07-02 NOTE — Unmapped (Signed)
[facility] Refill Coordination Note    Specialty Medication(s) to be Shipped:   Inflammatory Disorders: Taltz    Other medication(s) to be shipped: No additional medications requested for fill at this time     Lisa Carlson, DOB: 11/25/1945  Phone: [phone] (home)       All above HIPAA information was verified with patient.     Was a Nurse, learning disability used for this call? No    Completed refill call assessment today to schedule patient's medication shipment from the [facility] [phone]).  All relevant notes have been reviewed.     Specialty medication(s) and dose(s) confirmed: Regimen is correct and unchanged.   Changes to medications: [name] reports no changes at this time.  Changes to insurance: No  New side effects reported not previously addressed with a pharmacist or physician: None reported  Questions for the pharmacist: No    Confirmed patient received a Conservation officer, historic buildings and a Surveyor, mining with first shipment. The patient will receive a drug information handout for each medication shipped and additional FDA Medication Guides as required.       DISEASE/MEDICATION-SPECIFIC INFORMATION        For patients on injectable medications: Patient currently has 0 doses left.  Next injection is scheduled for 07/13/21.    SPECIALTY MEDICATION ADHERENCE     Medication Adherence    Patient reported X missed doses in the last month: 0  Specialty Medication: TALTZ AUTOINJECTOR 80 mg/mL  Patient is on additional specialty medications: No              Were doses missed due to medication being on hold? No    Taltz 80 mg/ml: 0 days of medicine on hand        REFERRAL TO PHARMACIST     Referral to the pharmacist: Not needed      [facility]     Shipping address confirmed in Epic.     Delivery Scheduled: Yes, Expected medication delivery date: 07/08/21.     Medication will be delivered via Same Day Courier to the prescription address in Epic WAM.    Lisa Carlson   [facility] Specialty Technician

## 2021-07-05 DIAGNOSIS — E782 Mixed hyperlipidemia: Secondary | ICD-10-CM | POA: Diagnosis not present

## 2021-07-05 DIAGNOSIS — E669 Obesity, unspecified: Secondary | ICD-10-CM | POA: Diagnosis not present

## 2021-07-05 DIAGNOSIS — I10 Essential (primary) hypertension: Secondary | ICD-10-CM | POA: Diagnosis not present

## 2021-07-05 DIAGNOSIS — G4733 Obstructive sleep apnea (adult) (pediatric): Secondary | ICD-10-CM | POA: Diagnosis not present

## 2021-07-05 DIAGNOSIS — S50812A Abrasion of left forearm, initial encounter: Secondary | ICD-10-CM | POA: Diagnosis not present

## 2021-07-05 DIAGNOSIS — R21 Rash and other nonspecific skin eruption: Secondary | ICD-10-CM | POA: Diagnosis not present

## 2021-07-08 MED FILL — TALTZ AUTOINJECTOR 80 MG/ML SUBCUTANEOUS: SUBCUTANEOUS | 28 days supply | Qty: 1 | Fill #1

## 2021-07-09 DIAGNOSIS — I10 Essential (primary) hypertension: Secondary | ICD-10-CM | POA: Diagnosis not present

## 2021-07-09 DIAGNOSIS — E782 Mixed hyperlipidemia: Secondary | ICD-10-CM | POA: Diagnosis not present

## 2021-07-09 DIAGNOSIS — E039 Hypothyroidism, unspecified: Secondary | ICD-10-CM | POA: Diagnosis not present

## 2021-07-12 DIAGNOSIS — H903 Sensorineural hearing loss, bilateral: Secondary | ICD-10-CM | POA: Diagnosis not present

## 2021-07-12 DIAGNOSIS — R42 Dizziness and giddiness: Secondary | ICD-10-CM | POA: Diagnosis not present

## 2021-07-27 DIAGNOSIS — L409 Psoriasis, unspecified: Secondary | ICD-10-CM | POA: Diagnosis not present

## 2021-07-27 DIAGNOSIS — E669 Obesity, unspecified: Secondary | ICD-10-CM | POA: Diagnosis not present

## 2021-07-27 DIAGNOSIS — G4733 Obstructive sleep apnea (adult) (pediatric): Secondary | ICD-10-CM | POA: Diagnosis not present

## 2021-07-27 DIAGNOSIS — R5383 Other fatigue: Secondary | ICD-10-CM | POA: Diagnosis not present

## 2021-07-27 DIAGNOSIS — E782 Mixed hyperlipidemia: Secondary | ICD-10-CM | POA: Diagnosis not present

## 2021-07-29 ENCOUNTER — Ambulatory Visit: Payer: Medicare HMO | Admitting: Internal Medicine

## 2021-07-30 NOTE — Unmapped (Signed)
[facility] Refill Coordination Note    Specialty Medication(s) to be Shipped:   Inflammatory Disorders: Taltz 80mg /ml Inj     Other medication(s) to be shipped: No additional medications requested for fill at this time     Lisa Carlson, DOB: 07/15/1945  Phone: [phone] (home)     All above HIPAA information was verified with patient.     Was a Nurse, learning disability used for this call? No    Completed refill call assessment today to schedule patient's medication shipment from the [facility] [phone]).  All relevant notes have been reviewed.     Specialty medication(s) and dose(s) confirmed: Regimen is correct and unchanged.   Changes to medications: [name] reports no changes at this time.  Changes to insurance: No  New side effects reported not previously addressed with a pharmacist or physician: None reported  Questions for the pharmacist: No    Confirmed patient received a Conservation officer, historic buildings and a Surveyor, mining with first shipment. The patient will receive a drug information handout for each medication shipped and additional FDA Medication Guides as required.       DISEASE/MEDICATION-SPECIFIC INFORMATION        For patients on injectable medications: Patient currently has 0 doses left.  Next injection is scheduled for 08/10/2021.    SPECIALTY MEDICATION ADHERENCE     Medication Adherence    Patient reported X missed doses in the last month: 0  Specialty Medication: Taltz  Patient is on additional specialty medications: No  Any gaps in refill history greater than 2 weeks in the last 3 months: no  Demonstrates understanding of importance of adherence: yes  Informant: patient  Reliability of informant: reliable  Confirmed plan for next specialty medication refill: delivery by pharmacy  Refills needed for supportive medications: not needed      Were doses missed due to medication being on hold? No    Taltz 80mg /ml Inj: 0 days of medicine on hand     REFERRAL TO PHARMACIST     Referral to the pharmacist: Not needed    [facility]     Shipping address confirmed in Epic.     Delivery Scheduled: Yes, Expected medication delivery date: 08/04/2021.     Medication will be delivered via Same Day Courier to the prescription address in Epic WAM.    Ryin Ambrosius D Saja Bartolini   [facility] Specialty Technician

## 2021-08-04 MED FILL — TALTZ AUTOINJECTOR 80 MG/ML SUBCUTANEOUS: SUBCUTANEOUS | 28 days supply | Qty: 1 | Fill #2

## 2021-08-12 ENCOUNTER — Ambulatory Visit: Payer: Medicare HMO | Admitting: Internal Medicine

## 2021-08-12 DIAGNOSIS — D519 Vitamin B12 deficiency anemia, unspecified: Secondary | ICD-10-CM | POA: Diagnosis not present

## 2021-08-12 DIAGNOSIS — E782 Mixed hyperlipidemia: Secondary | ICD-10-CM | POA: Diagnosis not present

## 2021-08-12 DIAGNOSIS — H60391 Other infective otitis externa, right ear: Secondary | ICD-10-CM | POA: Diagnosis not present

## 2021-08-12 DIAGNOSIS — G4733 Obstructive sleep apnea (adult) (pediatric): Secondary | ICD-10-CM | POA: Diagnosis not present

## 2021-08-12 DIAGNOSIS — F411 Generalized anxiety disorder: Secondary | ICD-10-CM | POA: Diagnosis not present

## 2021-08-12 DIAGNOSIS — E669 Obesity, unspecified: Secondary | ICD-10-CM | POA: Diagnosis not present

## 2021-08-12 DIAGNOSIS — R7303 Prediabetes: Secondary | ICD-10-CM | POA: Diagnosis not present

## 2021-08-12 DIAGNOSIS — G629 Polyneuropathy, unspecified: Secondary | ICD-10-CM | POA: Diagnosis not present

## 2021-08-12 DIAGNOSIS — I10 Essential (primary) hypertension: Secondary | ICD-10-CM | POA: Diagnosis not present

## 2021-08-26 DIAGNOSIS — G4733 Obstructive sleep apnea (adult) (pediatric): Secondary | ICD-10-CM | POA: Diagnosis not present

## 2021-08-26 DIAGNOSIS — R5383 Other fatigue: Secondary | ICD-10-CM | POA: Diagnosis not present

## 2021-08-26 DIAGNOSIS — E782 Mixed hyperlipidemia: Secondary | ICD-10-CM | POA: Diagnosis not present

## 2021-08-26 DIAGNOSIS — I10 Essential (primary) hypertension: Secondary | ICD-10-CM | POA: Diagnosis not present

## 2021-08-26 DIAGNOSIS — I959 Hypotension, unspecified: Secondary | ICD-10-CM | POA: Diagnosis not present

## 2021-08-26 DIAGNOSIS — I340 Nonrheumatic mitral (valve) insufficiency: Secondary | ICD-10-CM | POA: Diagnosis not present

## 2021-08-26 DIAGNOSIS — R7303 Prediabetes: Secondary | ICD-10-CM | POA: Diagnosis not present

## 2021-08-26 DIAGNOSIS — H9191 Unspecified hearing loss, right ear: Secondary | ICD-10-CM | POA: Diagnosis not present

## 2021-08-26 DIAGNOSIS — R110 Nausea: Secondary | ICD-10-CM | POA: Diagnosis not present

## 2021-08-26 DIAGNOSIS — Z20822 Contact with and (suspected) exposure to covid-19: Secondary | ICD-10-CM | POA: Diagnosis not present

## 2021-08-26 DIAGNOSIS — E669 Obesity, unspecified: Secondary | ICD-10-CM | POA: Diagnosis not present

## 2021-08-26 DIAGNOSIS — I2510 Atherosclerotic heart disease of native coronary artery without angina pectoris: Secondary | ICD-10-CM | POA: Diagnosis not present

## 2021-08-26 DIAGNOSIS — R519 Headache, unspecified: Secondary | ICD-10-CM | POA: Diagnosis not present

## 2021-08-26 DIAGNOSIS — R413 Other amnesia: Secondary | ICD-10-CM | POA: Diagnosis not present

## 2021-08-26 DIAGNOSIS — I4891 Unspecified atrial fibrillation: Secondary | ICD-10-CM | POA: Diagnosis not present

## 2021-08-26 DIAGNOSIS — E039 Hypothyroidism, unspecified: Secondary | ICD-10-CM | POA: Diagnosis not present

## 2021-08-26 DIAGNOSIS — R42 Dizziness and giddiness: Secondary | ICD-10-CM | POA: Diagnosis not present

## 2021-08-26 DIAGNOSIS — U071 COVID-19: Secondary | ICD-10-CM | POA: Diagnosis not present

## 2021-08-26 DIAGNOSIS — Z6841 Body Mass Index (BMI) 40.0 and over, adult: Secondary | ICD-10-CM | POA: Diagnosis not present

## 2021-08-27 NOTE — Unmapped (Signed)
[facility] Refill Coordination Note    Specialty Medication(s) to be Shipped:   Inflammatory Disorders: Taltz 80mg /ml Inj     Other medication(s) to be shipped: No additional medications requested for fill at this time     Lisa Carlson, DOB: 14-Jun-1945  Phone: [phone] (home)     All above HIPAA information was verified with patient.     Was a Nurse, learning disability used for this call? No    Completed refill call assessment today to schedule patient's medication shipment from the [facility] [phone]).  All relevant notes have been reviewed.     Specialty medication(s) and dose(s) confirmed: Regimen is correct and unchanged.   Changes to medications: Maelynn reports no changes at this time.  Changes to insurance: No  New side effects reported not previously addressed with a pharmacist or physician: None reported  Questions for the pharmacist: No    Confirmed patient received a Conservation officer, historic buildings and a Surveyor, mining with first shipment. The patient will receive a drug information handout for each medication shipped and additional FDA Medication Guides as required.       DISEASE/MEDICATION-SPECIFIC INFORMATION        For patients on injectable medications: Patient currently has 0 doses left.  Next injection is scheduled for 09/07/2021 (patient will delay if she is not over Covid yet).    SPECIALTY MEDICATION ADHERENCE     Medication Adherence    Patient reported X missed doses in the last month: 0  Specialty Medication: Taltz  Patient is on additional specialty medications: No  Any gaps in refill history greater than 2 weeks in the last 3 months: no  Demonstrates understanding of importance of adherence: yes  Informant: patient  Reliability of informant: reliable  Confirmed plan for next specialty medication refill: delivery by pharmacy  Refills needed for supportive medications: not needed      Were doses missed due to medication being on hold? No    Taltz 80mg /ml Inj: 0 days of medicine on hand     REFERRAL TO PHARMACIST     Referral to the pharmacist: Not needed    [facility]     Shipping address confirmed in Epic.     Delivery Scheduled: Yes, Expected medication delivery date: 08/31/2021.     Medication will be delivered via Same Day Courier to the prescription address in Epic WAM.    Chantavia Bazzle D Tressy Kunzman   [facility] Specialty Technician

## 2021-08-31 MED FILL — TALTZ AUTOINJECTOR 80 MG/ML SUBCUTANEOUS: SUBCUTANEOUS | 28 days supply | Qty: 1 | Fill #3

## 2021-09-10 DIAGNOSIS — T161XXA Foreign body in right ear, initial encounter: Secondary | ICD-10-CM | POA: Diagnosis not present

## 2021-09-10 DIAGNOSIS — H6061 Unspecified chronic otitis externa, right ear: Secondary | ICD-10-CM | POA: Diagnosis not present

## 2021-09-15 DIAGNOSIS — Z0100 Encounter for examination of eyes and vision without abnormal findings: Secondary | ICD-10-CM | POA: Diagnosis not present

## 2021-09-15 DIAGNOSIS — M3501 Sicca syndrome with keratoconjunctivitis: Secondary | ICD-10-CM | POA: Diagnosis not present

## 2021-09-24 NOTE — Unmapped (Signed)
Lisa Carlson reports her Altamease Oiler treatment is working well. She only has a few spots left over on her elbows, and her itching is gone. She occasionally uses topical steroids.    She reports no issues with adverse effects.     [facility] Clinical Assessment & Refill Coordination Note    Lisa Carlson, DOB: 03-21-1946  Phone: [phone] (home)     All above HIPAA information was verified with patient.     Was a Nurse, learning disability used for this call? No    Specialty Medication(s):   Inflammatory Disorders: Taltz     Current Outpatient Medications   Medication Sig Dispense Refill    albuterol (PROVENTIL HFA;VENTOLIN HFA) 90 mcg/actuation inhaler Inhale 2 puffs every six (6) hours as needed for wheezing.      amoxicillin-clavulanate (AUGMENTIN) 875-125 mg per tablet amoxicillin 875 mg-potassium clavulanate 125 mg tablet      apixaban (ELIQUIS) 5 mg Tab Take 5 mg by mouth Two (2) times a day.      atorvastatin (LIPITOR) 20 MG tablet Take 20 mg by mouth daily.       B-complex with vitamin C (TOTAL B W/C) tablet vitamin b complex with b12      baclofen (LIORESAL) 10 MG tablet Take 10 mg by mouth Two (2) times a day.       betamethasone, augmented, (DIPROLENE) 0.05 % ointment Apply to scaly rash twice daily until smooth, then stop 100 g 5    calcium carbonate (OS-CAL) 500 mg calcium (1,250 mg) chewable tablet Chew 0.5 tablets daily as needed.       camphor-menthoL (SARNA) 0.5-0.5 % lotion Apply as needed for itch 222 mL 3    cetirizine (ZYRTEC) 10 MG tablet Take 10 mg by mouth daily.       ciprofloxacin-dexamethasone (CIPRODEX) 0.3-0.1 % otic suspension Administer 1 drop into both ears daily as needed.      clobetasol (TEMOVATE) 0.05 % cream Apply 1 application topically Two (2) times a day.      clobetasoL (TEMOVATE) 0.05 % ointment Apply 1 application topically Two (2) times a day. 120 g 1    cyclobenzaprine (FLEXERIL) 10 MG tablet cyclobenzaprine 10 mg tablet      diltiazem (TIAZAC) 120 MG 24 hr capsule diltiazem CD 120 mg capsule,extended release 24 hr      empty container Misc Use as directed to dispose of Humira pens. 1 each 2    esomeprazole (NEXIUM) 40 MG capsule Take 40 mg by mouth every morning before breakfast.      fluconazole (DIFLUCAN) 100 MG tablet Take 100 mg by mouth daily.  0    fluocinolone (SYNALAR) 0.01 % external solution fluocinolone 0.01 % topical solution      fluocinolone acetonide oiL 0.01 % Drop Administer 1 drop into both ears Two (2) times a day. 20 mL 2    fluticasone (FLONASE) 50 mcg/actuation nasal spray 2 sprays into each nostril daily.       furosemide (LASIX) 20 MG tablet Take 20 mg by mouth Two (2) times a day.      gabapentin (NEURONTIN) 300 MG capsule Take 300 mg by mouth Three (3) times a day.      hydroCHLOROthiazide (HYDRODIURIL) 25 MG tablet Take 25 mg by mouth daily.       HYDROcodone-acetaminophen (NORCO) 5-325 mg per tablet hydrocodone 5 mg-acetaminophen 325 mg tablet      hydrOXYzine (ATARAX) 25 MG tablet TAKE 1 TABLET BY MOUTH THREE TIMES A  DAY AS NEEDED. 270 tablet 1    ibuprofen (ADVIL,MOTRIN) 800 MG tablet Take 800 mg by mouth every six (6) hours as needed for pain.       ipratropium (ATROVENT) 42 mcg (0.06 %) nasal spray 2 sprays into each nostril four (4) times a day.       ivermectin (STROMECTOL) 3 mg Tab Take 9 tablets by mouth and repeat in one week 18 tablet 0    ixekizumab (TALTZ AUTOINJECTOR) 80 mg/mL AtIn auto-injector Inject the contents of 1 pen (80 mg total) under the skin every twenty-eight (28) days. 1 mL 11    levothyroxine 25 mcg cap Take 50 mg by mouth once.      losartan-hydrochlorothiazide (HYZAAR) 50-12.5 mg per tablet losartan 50 mg-hydrochlorothiazide 12.5 mg tablet      melatonin 10 mg Tab Take by mouth.       meloxicam (MOBIC) 15 MG tablet meloxicam 15 mg tablet      montelukast (SINGULAIR) 10 mg tablet Take 10 mg by mouth nightly.       neomycin-bacitracin-polymyxin-hydrocortisone (CORTISPORIN) 3.5-400-10,000 mg-unit/g-1% ophthalmic ointment Two (2) times a day.      neomycin-polymyxin-hydrocortisone (CORTISPORIN) 3.5-10,000-1 mg/mL-unit/mL-% otic solution neomycin-polymyxin-hydrocort 3.5 mg/mL-10,000 unit/mL-1 % ear solution      nitroglycerin (NITROSTAT) 0.4 MG SL tablet Place 0.4 mg under the tongue every five (5) minutes as needed for chest pain.      nystatin (MYCOSTATIN) powder       PARoxetine (PAXIL) 20 MG tablet Take 20 mg by mouth daily.       PNV no.63-iron,carbonyl-FA-dha 27 mg iron- 800 mcg-200 mg cap Take 300 mg by mouth every morning before breakfast.       pramoxine (SARNA SENSITIVE) 1 % Lotn Apply 1 application topically two (2) times a day. 222 mL 6    predniSONE (DELTASONE) 5 MG tablet Take 5 mg by mouth Two (2) times a day.  0    pregabalin (LYRICA) 50 MG capsule Take 50 mg by mouth daily as needed.       promethazine (PHENERGAN) 25 MG tablet promethazine 25 mg tablet      ranitidine (ZANTAC) 150 MG tablet Take 150 mg by mouth Two (2) times a day.      ranolazine (RANEXA) 1,000 mg SR tablet Take 1,000 mg by mouth every morning before breakfast.       silver sulfaDIAZINE (SILVADENE, SSD) 1 % cream       SPIRIVA RESPIMAT 1.25 mcg/actuation Mist USE AS DIRECTED 1 INHALATION EVERY DAY      SYMBICORT 160-4.5 mcg/actuation inhaler INHALE 1 PUFF ORALLY EVERY 12 HOURS, RINSE MOUTH WELL AFTER EACH USELRH  1    traZODone (DESYREL) 100 MG tablet Take 100 mg by mouth nightly.       triamcinolone (KENALOG) 0.1 % ointment Apply a thick layer twice a day to psoriasis until smooth, then stop 454 g 3    triamcinolone (KENALOG) 0.1 % ointment Apply topically Two (2) times a day. 454 g 1     No current facility-administered medications for this visit.        Changes to medications: Lisa Carlson reports no changes at this time.    Allergies   Allergen Reactions    Codeine Itching, Hives, Other (See Comments) and Rash       Changes to allergies: No    SPECIALTY MEDICATION ADHERENCE     Taltz - 0 left  Medication Adherence    Patient reported X missed doses in the  last month: 0  Specialty Medication: Taltz          Specialty medication(s) dose(s) confirmed: Regimen is correct and unchanged.     Are there any concerns with adherence? No    Adherence counseling provided? Not needed    CLINICAL MANAGEMENT AND INTERVENTION      Clinical Benefit Assessment:    Do you feel the medicine is effective or helping your condition? Yes    Clinical Benefit counseling provided? Not needed    Adverse Effects Assessment:    Are you experiencing any side effects? No    Are you experiencing difficulty administering your medicine? No    Quality of Life Assessment:    Quality of Life    Rheumatology  Oncology  Dermatology  1. What impact has your specialty medication had on the symptoms of your skin condition (i.e. itchiness, soreness, stinging)?: Tremendous  2. What impact has your specialty medication had on your comfort level with your skin?: Tremendous  Cystic Fibrosis          Have you discussed this with your provider? Not needed    Acute Infection Status:    Acute infections noted within Epic:  No active infections  Patient reported infection: None    Therapy Appropriateness:    Is therapy appropriate and patient progressing towards therapeutic goals? Yes, therapy is appropriate and should be continued    DISEASE/MEDICATION-SPECIFIC INFORMATION      For patients on injectable medications: Patient currently has 0 doses left.  Next injection is scheduled for Sat, 6/24 .    PATIENT SPECIFIC NEEDS     Does the patient have any physical, cognitive, or cultural barriers? No    Is the patient high risk? No    Does the patient require a Care Management Plan? No     SHIPPING     Specialty Medication(s) to be Shipped:   Inflammatory Disorders: Taltz    Other medication(s) to be shipped: No additional medications requested for fill at this time     Changes to insurance: No    Delivery Scheduled: Yes, Expected medication delivery date: Wed, 6/21.     Medication will be delivered via Same Day Courier to the confirmed prescription address in [facility].    The patient will receive a drug information handout for each medication shipped and additional FDA Medication Guides as required.  Verified that patient has previously received a Conservation officer, historic buildings and a Surveyor, mining.    The patient or caregiver noted above participated in the development of this care plan and knows that they can request review of or adjustments to the care plan at any time.      All of the patient's questions and concerns have been addressed.    Lanney Gins   [facility] Specialty Pharmacist

## 2021-09-27 DIAGNOSIS — I4891 Unspecified atrial fibrillation: Secondary | ICD-10-CM | POA: Diagnosis not present

## 2021-09-27 DIAGNOSIS — R110 Nausea: Secondary | ICD-10-CM | POA: Diagnosis not present

## 2021-09-27 DIAGNOSIS — H9191 Unspecified hearing loss, right ear: Secondary | ICD-10-CM | POA: Diagnosis not present

## 2021-09-27 DIAGNOSIS — R413 Other amnesia: Secondary | ICD-10-CM | POA: Diagnosis not present

## 2021-09-27 DIAGNOSIS — I4892 Unspecified atrial flutter: Secondary | ICD-10-CM | POA: Diagnosis not present

## 2021-09-27 DIAGNOSIS — R42 Dizziness and giddiness: Secondary | ICD-10-CM | POA: Diagnosis not present

## 2021-09-27 DIAGNOSIS — R519 Headache, unspecified: Secondary | ICD-10-CM | POA: Diagnosis not present

## 2021-09-27 DIAGNOSIS — Z6841 Body Mass Index (BMI) 40.0 and over, adult: Secondary | ICD-10-CM | POA: Diagnosis not present

## 2021-09-29 MED FILL — TALTZ AUTOINJECTOR 80 MG/ML SUBCUTANEOUS: SUBCUTANEOUS | 28 days supply | Qty: 1 | Fill #4

## 2021-10-08 DIAGNOSIS — E039 Hypothyroidism, unspecified: Secondary | ICD-10-CM | POA: Diagnosis not present

## 2021-10-08 DIAGNOSIS — E782 Mixed hyperlipidemia: Secondary | ICD-10-CM | POA: Diagnosis not present

## 2021-10-08 DIAGNOSIS — I10 Essential (primary) hypertension: Secondary | ICD-10-CM | POA: Diagnosis not present

## 2021-10-21 NOTE — Unmapped (Signed)
[facility] Refill Coordination Note    Specialty Medication(s) to be Shipped:   Inflammatory Disorders: Taltz 80mg /ml Inj     Other medication(s) to be shipped: No additional medications requested for fill at this time     Lisa Carlson, DOB: 1945-06-07  Phone: [phone] (home)     All above HIPAA information was verified with patient.     Was a Nurse, learning disability used for this call? No    Completed refill call assessment today to schedule patient's medication shipment from the [facility] [phone]).  All relevant notes have been reviewed.     Specialty medication(s) and dose(s) confirmed: Regimen is correct and unchanged.   Changes to medications: Lorilynn reports no changes at this time.  Changes to insurance: No  New side effects reported not previously addressed with a pharmacist or physician: None reported  Questions for the pharmacist: No    Confirmed patient received a Conservation officer, historic buildings and a Surveyor, mining with first shipment. The patient will receive a drug information handout for each medication shipped and additional FDA Medication Guides as required.       DISEASE/MEDICATION-SPECIFIC INFORMATION        For patients on injectable medications: Patient currently has 0 doses left.  Next injection is scheduled for 10/29/2021.    SPECIALTY MEDICATION ADHERENCE     Medication Adherence    Patient reported X missed doses in the last month: 0  Specialty Medication: Taltz  Patient is on additional specialty medications: No  Any gaps in refill history greater than 2 weeks in the last 3 months: no  Demonstrates understanding of importance of adherence: yes  Informant: patient  Reliability of informant: reliable  Confirmed plan for next specialty medication refill: delivery by pharmacy  Refills needed for supportive medications: not needed      Were doses missed due to medication being on hold? No    Taltz 80mg /ml Inj: 0 days of medicine on hand     REFERRAL TO PHARMACIST     Referral to the pharmacist: Not needed    [facility]     Shipping address confirmed in Epic.     Delivery Scheduled: Yes, Expected medication delivery date: 10/22/2021.     Medication will be delivered via Same Day Courier to the prescription address in Epic WAM.    Ieshia Hatcher D Carlicia Leavens   [facility] Specialty Technician

## 2021-10-22 MED FILL — TALTZ AUTOINJECTOR 80 MG/ML SUBCUTANEOUS: SUBCUTANEOUS | 28 days supply | Qty: 1 | Fill #5

## 2021-11-08 DIAGNOSIS — E039 Hypothyroidism, unspecified: Secondary | ICD-10-CM | POA: Diagnosis not present

## 2021-11-08 DIAGNOSIS — E782 Mixed hyperlipidemia: Secondary | ICD-10-CM | POA: Diagnosis not present

## 2021-11-08 DIAGNOSIS — I10 Essential (primary) hypertension: Secondary | ICD-10-CM | POA: Diagnosis not present

## 2021-11-19 NOTE — Unmapped (Signed)
[facility] Refill Coordination Note    Specialty Medication(s) to be Shipped:   Inflammatory Disorders: Taltz 80mg /ml Inj     Other medication(s) to be shipped: No additional medications requested for fill at this time     Lisa Carlson, DOB: 01-21-1946  Phone: [phone] (home)     All above HIPAA information was verified with patient.     Was a Nurse, learning disability used for this call? No    Completed refill call assessment today to schedule patient's medication shipment from the [facility] [phone]).  All relevant notes have been reviewed.     Specialty medication(s) and dose(s) confirmed: Regimen is correct and unchanged.   Changes to medications: Lisa Carlson reports no changes at this time.  Changes to insurance: No  New side effects reported not previously addressed with a pharmacist or physician: None reported  Questions for the pharmacist: No    Confirmed patient received a Conservation officer, historic buildings and a Surveyor, mining with first shipment. The patient will receive a drug information handout for each medication shipped and additional FDA Medication Guides as required.       DISEASE/MEDICATION-SPECIFIC INFORMATION        For patients on injectable medications: Patient currently has 0 doses left.  Next injection is scheduled for 11/26/2021.    SPECIALTY MEDICATION ADHERENCE     Medication Adherence    Patient reported X missed doses in the last month: 0  Specialty Medication: Taltz  Patient is on additional specialty medications: No  Any gaps in refill history greater than 2 weeks in the last 3 months: no  Demonstrates understanding of importance of adherence: yes  Informant: patient  Reliability of informant: reliable              Confirmed plan for next specialty medication refill: delivery by pharmacy  Refills needed for supportive medications: not needed      Were doses missed due to medication being on hold? No    Taltz 80mg /ml Inj: 0 days of medicine on hand REFERRAL TO PHARMACIST     Referral to the pharmacist: Not needed    [facility]     Shipping address confirmed in Epic.     Delivery Scheduled: Yes, Expected medication delivery date: 11/23/2021.     Medication will be delivered via Same Day Courier to the prescription address in Epic WAM.    Valere Dross   [facility] Specialty Technician

## 2021-11-19 NOTE — Unmapped (Signed)
This encounter was created in error - please disregard.

## 2021-11-23 MED FILL — TALTZ AUTOINJECTOR 80 MG/ML SUBCUTANEOUS: SUBCUTANEOUS | 28 days supply | Qty: 1 | Fill #6

## 2021-12-10 NOTE — Unmapped (Signed)
[facility] Pharm[facility] Refill Coordination Note    Specialty Medication(s) to be Shipped:   Inflammatory Disorders: Taltz 80mg /ml Inj     Other medication(s) to be shipped: No additional medications requested for fill at this time     Lisa Carlson, DOB: 1945/08/13  Phone: [phone] (home)     All above HIPAA information was verified with patient.     Was a Nurse, learning disability used for this call? No    Completed refill call assessment today to schedule patient's medication shipment from the [facility] [phone]).  All relevant notes have been reviewed.     Specialty medication(s) and dose(s) confirmed: Regimen is correct and unchanged.   Changes to medications: Avryl reports no changes at this time.  Changes to insurance: No  New side effects reported not previously addressed with a pharmacist or physician: None reported  Questions for the pharmacist: No    Confirmed patient received a Conservation officer, historic buildings and a Surveyor, mining with first shipment. The patient will receive a drug information handout for each medication shipped and additional FDA Medication Guides as required.       DISEASE/MEDICATION-SPECIFIC INFORMATION        For patients on injectable medications: Patient currently has 0 doses left.  Next injection is scheduled for 12/24/2021.    SPECIALTY MEDICATION ADHERENCE     Medication Adherence    Patient reported X missed doses in the last month: 0  Specialty Medication: Taltz  Patient is on additional specialty medications: No  Any gaps in refill history greater than 2 weeks in the last 3 months: no  Demonstrates understanding of importance of adherence: yes  Informant: patient  Reliability of informant: reliable              Confirmed plan for next specialty medication refill: delivery by pharmacy  Refills needed for supportive medications: not needed      Were doses missed due to medication being on hold? No    Taltz 80mg /ml Inj: 0 days of medicine on hand REFERRAL TO PHARMACIST     Referral to the pharmacist: Not needed    [facility]     Shipping address confirmed in Epic.     Delivery Scheduled: Yes, Expected medication delivery date: 12/17/2021.     Medication will be delivered via Same Day Courier to the prescription address in Epic WAM.    Valere Dross   [facility] Specialty Technician

## 2021-12-17 MED FILL — TALTZ AUTOINJECTOR 80 MG/ML SUBCUTANEOUS: SUBCUTANEOUS | 28 days supply | Qty: 1 | Fill #7

## 2022-01-08 DIAGNOSIS — I10 Essential (primary) hypertension: Secondary | ICD-10-CM | POA: Diagnosis not present

## 2022-01-08 DIAGNOSIS — E782 Mixed hyperlipidemia: Secondary | ICD-10-CM | POA: Diagnosis not present

## 2022-01-11 DIAGNOSIS — L409 Psoriasis, unspecified: Principal | ICD-10-CM

## 2022-01-11 MED ORDER — TRIAMCINOLONE ACETONIDE 0.1 % TOPICAL OINTMENT
INTRAMUSCULAR | 1 refills | 0.00000 days | Status: CP
Start: 2022-01-11 — End: ?

## 2022-01-13 NOTE — Unmapped (Signed)
[facility] Pharm[facility] Refill Coordination Note    Specialty Medication(s) to be Shipped:   Inflammatory Disorders: Taltz 80mg /ml Inj     Other medication(s) to be shipped: No additional medications requested for fill at this time     Lisa Carlson, DOB: 04-23-1945  Phone: [phone] (home)     All above HIPAA information was verified with patient.     Was a Nurse, learning disability used for this call? No    Completed refill call assessment today to schedule patient's medication shipment from the [facility] [phone]).  All relevant notes have been reviewed.     Specialty medication(s) and dose(s) confirmed: Regimen is correct and unchanged.   Changes to medications: Lisa Carlson reports no changes at this time.  Changes to insurance: No  New side effects reported not previously addressed with a pharmacist or physician: None reported  Questions for the pharmacist: No    Confirmed patient received a Conservation officer, historic buildings and a Surveyor, mining with first shipment. The patient will receive a drug information handout for each medication shipped and additional FDA Medication Guides as required.       DISEASE/MEDICATION-SPECIFIC INFORMATION        For patients on injectable medications: Patient currently has 0 doses left.  Next injection is scheduled for 10/13.    SPECIALTY MEDICATION ADHERENCE     Medication Adherence    Patient reported X missed doses in the last month: 0  Specialty Medication: Taltz  Patient is on additional specialty medications: No  Any gaps in refill history greater than 2 weeks in the last 3 months: no  Demonstrates understanding of importance of adherence: yes  Informant: patient  Reliability of informant: reliable              Confirmed plan for next specialty medication refill: delivery by pharmacy  Refills needed for supportive medications: not needed      Were doses missed due to medication being on hold? No    Taltz 80mg /ml Inj: 0 days of medicine on hand     REFERRAL TO PHARMACIST     Referral to the pharmacist: Not needed    [facility]     Shipping address confirmed in Epic.     Delivery Scheduled: Yes, Expected medication delivery date: 01/19/2022.     Medication will be delivered via Same Day Courier to the prescription address in Epic WAM.    Lisa Carlson   [facility] Specialty Technician

## 2022-01-19 MED FILL — TALTZ AUTOINJECTOR 80 MG/ML SUBCUTANEOUS: SUBCUTANEOUS | 28 days supply | Qty: 1 | Fill #8

## 2022-01-26 ENCOUNTER — Emergency Department
Admission: EM | Admit: 2022-01-26 | Discharge: 2022-01-26 | Disposition: A | Discharge status: Home / Self Care | Payer: Medicare HMO | Attending: Emergency Medicine | Admitting: Emergency Medicine

## 2022-01-26 ENCOUNTER — Other Ambulatory Visit: Payer: Self-pay

## 2022-01-26 ENCOUNTER — Emergency Department: Payer: Medicare HMO

## 2022-01-26 DIAGNOSIS — R0602 Shortness of breath: Secondary | ICD-10-CM | POA: Diagnosis not present

## 2022-01-26 DIAGNOSIS — J209 Acute bronchitis, unspecified: Secondary | ICD-10-CM | POA: Diagnosis not present

## 2022-01-26 DIAGNOSIS — J40 Bronchitis, not specified as acute or chronic: Secondary | ICD-10-CM

## 2022-01-26 DIAGNOSIS — R059 Cough, unspecified: Secondary | ICD-10-CM | POA: Diagnosis not present

## 2022-01-26 DIAGNOSIS — E860 Dehydration: Secondary | ICD-10-CM | POA: Insufficient documentation

## 2022-01-26 LAB — BASIC METABOLIC PANEL
Anion gap: 8 (ref 5–15)
BUN: 25 mg/dL — ABNORMAL HIGH (ref 8–23)
CO2: 26 mmol/L (ref 22–32)
Calcium: 9.2 mg/dL (ref 8.9–10.3)
Chloride: 104 mmol/L (ref 98–111)
Creatinine, Ser: 1.21 mg/dL — ABNORMAL HIGH (ref 0.44–1.00)
GFR, Estimated: 47 mL/min — ABNORMAL LOW (ref >60)
Glucose, Bld: 115 mg/dL — ABNORMAL HIGH (ref 70–99)
Potassium: 4.1 mmol/L (ref 3.5–5.1)
Sodium: 138 mmol/L (ref 135–145)

## 2022-01-26 LAB — CBC
HCT: 46.4 % — ABNORMAL HIGH (ref 36.0–46.0)
Hemoglobin: 15.1 g/dL — ABNORMAL HIGH (ref 12.0–15.0)
MCH: 27.6 pg (ref 26.0–34.0)
MCHC: 32.5 g/dL (ref 30.0–36.0)
MCV: 84.7 fL (ref 80.0–100.0)
Platelets: 327 10*3/uL (ref 150–400)
RBC: 5.48 MIL/uL — ABNORMAL HIGH (ref 3.87–5.11)
RDW: 13.3 % (ref 11.5–15.5)
WBC: 6.4 10*3/uL (ref 4.0–10.5)
nRBC: 0 % (ref 0.0–0.2)

## 2022-01-26 LAB — TROPONIN I (HIGH SENSITIVITY): Troponin I (High Sensitivity): 10 ng/L (ref <18)

## 2022-01-26 MED ORDER — IPRATROPIUM-ALBUTEROL 0.5-2.5 (3) MG/3ML IN SOLN
3.0000 mL | Freq: Once | RESPIRATORY_TRACT | Status: AC
  Administered 2022-01-26: 3 mL via RESPIRATORY_TRACT
  Filled 2022-01-26: qty 3

## 2022-01-26 MED ORDER — PREDNISONE 50 MG PO TABS: 50.0000 mg | ORAL_TABLET | Freq: Every day | ORAL | 0 refills | Status: DC

## 2022-01-26 MED ORDER — PREDNISONE 20 MG PO TABS
60.0000 mg | ORAL_TABLET | Freq: Once | ORAL | Status: AC
  Administered 2022-01-26: 60 mg via ORAL
  Filled 2022-01-26: qty 3

## 2022-01-26 NOTE — ED Provider Notes (Signed)
[facility] Provider Note    Event Date/Time   First MD Initiated Contact with Patient 01/26/22 1159     (approximate)   History   Cough and Shortness of Breath   HPI  Patricia Walker is a 76 y.o. female extensive past medical history who presents with complaints of cough for several weeks.  She also complains of wheezing.  Denies fevers or chills.  No chest pain or pleurisy.  No calf pain or swelling     Physical Exam   Triage Vital Signs: ED Triage Vitals  Enc Vitals Group     BP 01/26/22 1136 133/77     Pulse Rate 01/26/22 1136 61     Resp 01/26/22 1136 19     Temp 01/26/22 1136 97.9 F (36.6 C)     Temp src --      SpO2 01/26/22 1136 100 %     Weight --      Height --      Head Circumference --      Peak Flow --      Pain Score 01/26/22 1135 6     Pain Loc --      Pain Edu? --      Excl. in [place]? --     Most recent vital signs: Vitals:   01/26/22 1136 01/26/22 1349  BP: 133/77 134/78  Pulse: 61 62  Resp: 19 20  Temp: 97.9 F (36.6 C) 98 F (36.7 C)  SpO2: 100% 100%     General: Awake, no distress.  CV:  Good peripheral perfusion.  Resp:  Normal effort.  Scattered wheezes Abd:  No distention.  Other:     ED Results / Procedures / Treatments   Labs (all labs ordered are listed, but only abnormal results are displayed) Labs Reviewed  BASIC METABOLIC PANEL - Abnormal; Notable for the following components:      Result Value   Glucose, Bld 115 (*)    BUN 25 (*)    Creatinine, Ser 1.21 (*)    GFR, Estimated 47 (*)    All other components within normal limits  CBC - Abnormal; Notable for the following components:   RBC 5.48 (*)    Hemoglobin 15.1 (*)    HCT 46.4 (*)    All other components within normal limits  TROPONIN I (HIGH SENSITIVITY)     EKG ED ECG REPORT I, Lavonia Drafts, the attending physician, personally viewed and interpreted this ECG.  Date: 01/26/2022 EKG Time:  Rate:  Rhythm: normal sinus  rhythm QRS Axis: left Intervals: abnormal ST/T Wave abnormalities: non specific changes Narrative Interpretation: no evidence of acute ischemia    RADIOLOGY X-ray viewed interpreted by me, no evidence of pneumonia    PROCEDURES:  Critical Care performed:   Procedures   MEDICATIONS ORDERED IN ED: Medications  ipratropium-albuterol (DUONEB) 0.5-2.5 (3) MG/3ML nebulizer solution 3 mL (3 mLs Nebulization Given 01/26/22 1228)  ipratropium-albuterol (DUONEB) 0.5-2.5 (3) MG/3ML nebulizer solution 3 mL (3 mLs Nebulization Given 01/26/22 1228)  predniSONE (DELTASONE) tablet 60 mg (60 mg Oral Given 01/26/22 1227)     IMPRESSION / MDM / ASSESSMENT AND PLAN / ED COURSE  I reviewed the triage vital signs and the nursing notes. Patient's presentation is most consistent with acute illness / injury with system symptoms.   Patient presents with complaints of cough, wheezing, mild shortness of breath for several weeks now.  Differential includes bronchitis, pneumonia, asthma/COPD, bronchospasm  Chest x-ray is not  consistent with pneumonia.  Patient's white blood cell count is normal.  High sensitive troponin and EKG are reassuring.  Mild dehydration noted on BMP  Patient treated with DuoNebs as well as prednisone  Suspect bronchitis with bronchospasm given chest x-ray cough and wheezing  Patient improved after treatment, will provide Rx for prednisone, she will continue to do nebulizers at home, precautions discussed       FINAL CLINICAL IMPRESSION(S) / ED DIAGNOSES   Final diagnoses:  Bronchitis     Rx / DC Orders   ED Discharge Orders          Ordered    predniSONE (DELTASONE) 50 MG tablet  Daily with breakfast        01/26/22 1333             Note:  This document was prepared using Dragon voice recognition software and Soyars include unintentional dictation errors.   Lavonia Drafts, MD 01/26/22 [phone]

## 2022-01-26 NOTE — ED Triage Notes (Signed)
Pt come with c/o sob, cough and congestion. Pt states this all started week ago. Pt states no help with prescribed medications.   Pt states some chest pressure.

## 2022-02-01 ENCOUNTER — Ambulatory Visit: Admit: 2022-02-01 | Discharge: 2022-02-02 | Payer: MEDICARE

## 2022-02-01 DIAGNOSIS — R0600 Dyspnea, unspecified: Principal | ICD-10-CM

## 2022-02-01 DIAGNOSIS — J40 Bronchitis, not specified as acute or chronic: Secondary | ICD-10-CM | POA: Diagnosis not present

## 2022-02-01 DIAGNOSIS — J209 Acute bronchitis, unspecified: Secondary | ICD-10-CM | POA: Diagnosis not present

## 2022-02-01 DIAGNOSIS — J069 Acute upper respiratory infection, unspecified: Secondary | ICD-10-CM | POA: Diagnosis not present

## 2022-02-01 DIAGNOSIS — Z20822 Contact with and (suspected) exposure to covid-19: Secondary | ICD-10-CM | POA: Diagnosis not present

## 2022-02-01 DIAGNOSIS — J449 Chronic obstructive pulmonary disease, unspecified: Secondary | ICD-10-CM | POA: Diagnosis not present

## 2022-02-08 DIAGNOSIS — E782 Mixed hyperlipidemia: Secondary | ICD-10-CM | POA: Diagnosis not present

## 2022-02-08 DIAGNOSIS — I10 Essential (primary) hypertension: Secondary | ICD-10-CM | POA: Diagnosis not present

## 2022-02-08 DIAGNOSIS — E039 Hypothyroidism, unspecified: Secondary | ICD-10-CM | POA: Diagnosis not present

## 2022-02-17 DIAGNOSIS — M7541 Impingement syndrome of right shoulder: Secondary | ICD-10-CM | POA: Diagnosis not present

## 2022-02-17 DIAGNOSIS — M7551 Bursitis of right shoulder: Secondary | ICD-10-CM | POA: Diagnosis not present

## 2022-02-17 DIAGNOSIS — M778 Other enthesopathies, not elsewhere classified: Secondary | ICD-10-CM | POA: Diagnosis not present

## 2022-02-25 DIAGNOSIS — M7551 Bursitis of right shoulder: Secondary | ICD-10-CM | POA: Diagnosis not present

## 2022-02-25 DIAGNOSIS — M25512 Pain in left shoulder: Secondary | ICD-10-CM | POA: Diagnosis not present

## 2022-02-25 DIAGNOSIS — M62838 Other muscle spasm: Secondary | ICD-10-CM | POA: Diagnosis not present

## 2022-02-25 DIAGNOSIS — M7541 Impingement syndrome of right shoulder: Secondary | ICD-10-CM | POA: Diagnosis not present

## 2022-02-25 DIAGNOSIS — M778 Other enthesopathies, not elsewhere classified: Secondary | ICD-10-CM | POA: Diagnosis not present

## 2022-03-10 DIAGNOSIS — I10 Essential (primary) hypertension: Secondary | ICD-10-CM | POA: Diagnosis not present

## 2022-03-10 DIAGNOSIS — E782 Mixed hyperlipidemia: Secondary | ICD-10-CM | POA: Diagnosis not present

## 2022-03-21 ENCOUNTER — Other Ambulatory Visit: Payer: Self-pay | Admitting: Family

## 2022-03-21 DIAGNOSIS — N6315 Unspecified lump in the right breast, overlapping quadrants: Secondary | ICD-10-CM

## 2022-03-22 DIAGNOSIS — L409 Psoriasis, unspecified: Principal | ICD-10-CM

## 2022-03-22 MED ORDER — TALTZ AUTOINJECTOR 80 MG/ML SUBCUTANEOUS
SUBCUTANEOUS | 0 refills | 28.00000 days | Status: CP
Start: 2022-03-22 — End: ?
  Filled 2022-03-25: qty 1, 28d supply, fill #0

## 2022-03-23 NOTE — Unmapped (Signed)
Patient declined scheduling an appointment at this time. Per patient she will call back in January    Thanks,  Ellsworth

## 2022-03-24 NOTE — Unmapped (Signed)
[facility] Refill Coordination Note    Specialty Medication(s) to be Shipped:   Inflammatory Disorders: Taltz 80mg /ml Inj     Other medication(s) to be shipped: No additional medications requested for fill at this time     Lisa Carlson, DOB: May 20, 1945  Phone: [phone] (home)     All above HIPAA information was verified with patient.     Was a Nurse, learning disability used for this call? No    Completed refill call assessment today to schedule patient's medication shipment from the [facility] [phone]).  All relevant notes have been reviewed.     Specialty medication(s) and dose(s) confirmed: Regimen is correct and unchanged.   Changes to medications: Lisa Carlson reports no changes at this time.  Changes to insurance: No  New side effects reported not previously addressed with a pharmacist or physician: None reported  Questions for the pharmacist: No    Confirmed patient received a Conservation officer, historic buildings and a Surveyor, mining with first shipment. The patient will receive a drug information handout for each medication shipped and additional FDA Medication Guides as required.       DISEASE/MEDICATION-SPECIFIC INFORMATION        For patients on injectable medications: Patient currently has 0 doses left.  Next injection is scheduled for 12/15.    SPECIALTY MEDICATION ADHERENCE     Medication Adherence    Patient reported X missed doses in the last month: 0  Specialty Medication: taltz  Patient is on additional specialty medications: No  Any gaps in refill history greater than 2 weeks in the last 3 months: no  Demonstrates understanding of importance of adherence: yes  Informant: patient  Reliability of informant: reliable              Confirmed plan for next specialty medication refill: delivery by pharmacy  Refills needed for supportive medications: not needed      Were doses missed due to medication being on hold? No    Taltz 80mg /ml Inj: 0 days of medicine on hand     REFERRAL TO PHARMACIST     Referral to the pharmacist: Not needed    [facility]     Shipping address confirmed in Epic.     Delivery Scheduled: Yes, Expected medication delivery date: 12/15.     Medication will be delivered via Same Day Courier to the prescription address in Epic WAM.    Lisa Carlson   [facility] Specialty Technician

## 2022-04-07 ENCOUNTER — Ambulatory Visit
Admission: RE | Admit: 2022-04-07 | Discharge: 2022-04-07 | Disposition: A | Discharge status: Home / Self Care | Payer: Medicare HMO | Source: Ambulatory Visit | Attending: Family | Admitting: Family

## 2022-04-07 DIAGNOSIS — N6315 Unspecified lump in the right breast, overlapping quadrants: Secondary | ICD-10-CM | POA: Diagnosis not present

## 2022-04-07 DIAGNOSIS — N6489 Other specified disorders of breast: Secondary | ICD-10-CM | POA: Diagnosis not present

## 2022-04-07 DIAGNOSIS — R928 Other abnormal and inconclusive findings on diagnostic imaging of breast: Secondary | ICD-10-CM | POA: Diagnosis not present

## 2022-04-13 DIAGNOSIS — G4733 Obstructive sleep apnea (adult) (pediatric): Secondary | ICD-10-CM | POA: Diagnosis not present

## 2022-04-13 DIAGNOSIS — E1165 Type 2 diabetes mellitus with hyperglycemia: Secondary | ICD-10-CM | POA: Diagnosis not present

## 2022-04-15 DIAGNOSIS — L409 Psoriasis, unspecified: Principal | ICD-10-CM

## 2022-04-15 MED ORDER — TALTZ AUTOINJECTOR 80 MG/ML SUBCUTANEOUS
SUBCUTANEOUS | 0 refills | 28.00000 days
Start: 2022-04-15 — End: ?

## 2022-04-15 NOTE — Unmapped (Signed)
Please help pt schedule follow up for medication refill. Pt not seen in the past year.

## 2022-04-18 DIAGNOSIS — M542 Cervicalgia: Secondary | ICD-10-CM | POA: Diagnosis not present

## 2022-04-18 DIAGNOSIS — M79602 Pain in left arm: Secondary | ICD-10-CM | POA: Diagnosis not present

## 2022-04-19 DIAGNOSIS — L409 Psoriasis, unspecified: Principal | ICD-10-CM

## 2022-04-19 MED ORDER — TRIAMCINOLONE ACETONIDE 0.1 % TOPICAL OINTMENT
1 refills | 0.00000 days
Start: 2022-04-19 — End: ?

## 2022-04-20 NOTE — Unmapped (Signed)
Patient needs an appointment for Taltz refills. Patient will contact the provider.

## 2022-04-20 NOTE — Unmapped (Signed)
Patient was last seen in the clinic on 01/19/2021 and has not yet arranged an appointment with the clinic. Please advise.

## 2022-04-21 DIAGNOSIS — M79602 Pain in left arm: Secondary | ICD-10-CM | POA: Diagnosis not present

## 2022-04-21 DIAGNOSIS — M542 Cervicalgia: Secondary | ICD-10-CM | POA: Diagnosis not present

## 2022-04-21 MED ORDER — TRIAMCINOLONE ACETONIDE 0.1 % TOPICAL OINTMENT
INTRAMUSCULAR | 1 refills | 0.00000 days | Status: CP
Start: 2022-04-21 — End: ?

## 2022-04-25 DIAGNOSIS — M542 Cervicalgia: Secondary | ICD-10-CM | POA: Diagnosis not present

## 2022-04-25 DIAGNOSIS — M79602 Pain in left arm: Secondary | ICD-10-CM | POA: Diagnosis not present

## 2022-04-28 DIAGNOSIS — M542 Cervicalgia: Secondary | ICD-10-CM | POA: Diagnosis not present

## 2022-04-28 DIAGNOSIS — M79602 Pain in left arm: Secondary | ICD-10-CM | POA: Diagnosis not present

## 2022-05-02 DIAGNOSIS — M542 Cervicalgia: Secondary | ICD-10-CM | POA: Diagnosis not present

## 2022-05-02 DIAGNOSIS — M79602 Pain in left arm: Secondary | ICD-10-CM | POA: Diagnosis not present

## 2022-05-03 ENCOUNTER — Other Ambulatory Visit: Payer: Self-pay | Admitting: Sports Medicine

## 2022-05-03 DIAGNOSIS — M7541 Impingement syndrome of right shoulder: Secondary | ICD-10-CM

## 2022-05-03 DIAGNOSIS — M62838 Other muscle spasm: Secondary | ICD-10-CM | POA: Diagnosis not present

## 2022-05-03 DIAGNOSIS — M778 Other enthesopathies, not elsewhere classified: Secondary | ICD-10-CM

## 2022-05-03 DIAGNOSIS — M25512 Pain in left shoulder: Secondary | ICD-10-CM | POA: Diagnosis not present

## 2022-05-03 DIAGNOSIS — M7551 Bursitis of right shoulder: Secondary | ICD-10-CM | POA: Diagnosis not present

## 2022-05-03 DIAGNOSIS — Z6841 Body Mass Index (BMI) 40.0 and over, adult: Secondary | ICD-10-CM | POA: Diagnosis not present

## 2022-05-10 DIAGNOSIS — E039 Hypothyroidism, unspecified: Secondary | ICD-10-CM | POA: Diagnosis not present

## 2022-05-10 DIAGNOSIS — E782 Mixed hyperlipidemia: Secondary | ICD-10-CM | POA: Diagnosis not present

## 2022-05-10 DIAGNOSIS — I10 Essential (primary) hypertension: Secondary | ICD-10-CM | POA: Diagnosis not present

## 2022-05-13 ENCOUNTER — Ambulatory Visit
Admission: RE | Admit: 2022-05-13 | Discharge: 2022-05-13 | Disposition: A | Discharge status: Home / Self Care | Payer: Medicare HMO | Source: Ambulatory Visit | Attending: Sports Medicine | Admitting: Sports Medicine

## 2022-05-13 DIAGNOSIS — M25512 Pain in left shoulder: Secondary | ICD-10-CM | POA: Diagnosis not present

## 2022-05-13 DIAGNOSIS — M7552 Bursitis of left shoulder: Secondary | ICD-10-CM | POA: Diagnosis not present

## 2022-05-13 DIAGNOSIS — M778 Other enthesopathies, not elsewhere classified: Secondary | ICD-10-CM

## 2022-05-13 DIAGNOSIS — M7551 Bursitis of right shoulder: Secondary | ICD-10-CM

## 2022-05-13 DIAGNOSIS — M25412 Effusion, left shoulder: Secondary | ICD-10-CM | POA: Diagnosis not present

## 2022-05-13 DIAGNOSIS — M19012 Primary osteoarthritis, left shoulder: Secondary | ICD-10-CM | POA: Diagnosis not present

## 2022-05-13 DIAGNOSIS — M62838 Other muscle spasm: Secondary | ICD-10-CM

## 2022-05-13 DIAGNOSIS — M7541 Impingement syndrome of right shoulder: Secondary | ICD-10-CM

## 2022-05-16 ENCOUNTER — Other Ambulatory Visit: Payer: Self-pay | Admitting: Family

## 2022-05-16 ENCOUNTER — Ambulatory Visit: Payer: Self-pay

## 2022-05-16 DIAGNOSIS — R21 Rash and other nonspecific skin eruption: Secondary | ICD-10-CM

## 2022-05-17 ENCOUNTER — Ambulatory Visit (INDEPENDENT_AMBULATORY_CARE_PROVIDER_SITE_OTHER): Payer: Medicare HMO | Admitting: Family

## 2022-05-17 DIAGNOSIS — Z20822 Contact with and (suspected) exposure to covid-19: Secondary | ICD-10-CM | POA: Diagnosis not present

## 2022-05-17 DIAGNOSIS — R059 Cough, unspecified: Secondary | ICD-10-CM

## 2022-05-17 LAB — POCT INFLUENZA A/B
Influenza A, POC: NEGATIVE
Influenza B, POC: NEGATIVE

## 2022-05-17 LAB — POCT RESPIRATORY SYNCYTIAL VIRUS: RSV Rapid Ag: NEGATIVE

## 2022-05-17 LAB — POC COVID19 BINAXNOW: SARS Coronavirus 2 Ag: NEGATIVE

## 2022-05-17 NOTE — Progress Notes (Signed)
  Subjective:     Patient ID: Patricia Walker, female   DOB: 10/08/1945, 77 y.o.   MRN: [id number]  Encounter for COVID/Flu/RSV  Patient has been having upper respiratory symptoms for several days.   No other concerns today.      Review of Systems  HENT:  Positive for rhinorrhea and sinus pressure.   Respiratory:  Positive for cough, shortness of breath and wheezing.        Objective:   Physical Exam Vitals and nursing note reviewed.    Nurse Visit. No Physical Exam Performed.   COVID/Flu/RSV testing in office today Results as follow:  COVID: Negative Flu A: Negative Flu B: Negative RSV: Negative    Assessment & Plan    [name] was seen today for follow-up.  Diagnoses and all orders for this visit:  Exposure to COVID-19 virus  Cough in adult -     POCT respiratory syncytial virus -     POCT Influenza A/B -     POC COVID-19 BinaxNow    Labs today - will call with results.   Continue current meds POC reviewed and agreed to.   No follow-ups on file.

## 2022-05-19 ENCOUNTER — Telehealth: Payer: Self-pay

## 2022-05-19 MED ORDER — AMOXICILLIN-POT CLAVULANATE 875-125 MG PO TABS: 1.0000 | ORAL_TABLET | Freq: Two times a day (BID) | ORAL | 0 refills | Status: DC

## 2022-05-19 NOTE — Telephone Encounter (Signed)
Pt called and left vm regarding if you can send rx abx for her (covid test negative tuesday) symptoms are mainly sinus congestion. Also mentioned that she has a boil & asked if there's abx that can help with both of these? Please advise

## 2022-05-23 DIAGNOSIS — M7552 Bursitis of left shoulder: Secondary | ICD-10-CM | POA: Diagnosis not present

## 2022-05-23 DIAGNOSIS — M67912 Unspecified disorder of synovium and tendon, left shoulder: Secondary | ICD-10-CM | POA: Diagnosis not present

## 2022-05-23 DIAGNOSIS — M7541 Impingement syndrome of right shoulder: Secondary | ICD-10-CM | POA: Diagnosis not present

## 2022-05-23 DIAGNOSIS — M19012 Primary osteoarthritis, left shoulder: Secondary | ICD-10-CM | POA: Diagnosis not present

## 2022-05-23 DIAGNOSIS — M25412 Effusion, left shoulder: Secondary | ICD-10-CM | POA: Diagnosis not present

## 2022-05-23 DIAGNOSIS — G8929 Other chronic pain: Secondary | ICD-10-CM | POA: Diagnosis not present

## 2022-05-28 NOTE — Unmapped (Signed)
Specialty Medication(s): Altamease Oiler    Lisa Carlson has been dis-enrolled from the [facility] specialty pharmacy services due to  no active order, patient needs visit for more.  .    Additional information provided to the patient: I spoke with patient and she's aware of need. She'll plan to call clinic soon.     Lanney Gins  [facility] Specialty Pharmacist

## 2022-06-06 ENCOUNTER — Telehealth: Payer: Medicare HMO

## 2022-06-10 ENCOUNTER — Telehealth: Payer: Self-pay

## 2022-06-10 NOTE — Telephone Encounter (Signed)
Pt called and left vm regarding rx mounjaro, said her appetite has increased since being on rx so she wanted me to ask if you can switch her back to rx ozempic because she seemed to do better on that? Please advise

## 2022-06-23 ENCOUNTER — Other Ambulatory Visit: Payer: Self-pay | Admitting: Family

## 2022-06-23 DIAGNOSIS — E1165 Type 2 diabetes mellitus with hyperglycemia: Secondary | ICD-10-CM

## 2022-07-15 ENCOUNTER — Telehealth: Payer: Self-pay

## 2022-07-15 ENCOUNTER — Ambulatory Visit (INDEPENDENT_AMBULATORY_CARE_PROVIDER_SITE_OTHER): Payer: Medicare HMO | Admitting: Family

## 2022-07-15 DIAGNOSIS — R0989 Other specified symptoms and signs involving the circulatory and respiratory systems: Secondary | ICD-10-CM

## 2022-07-15 DIAGNOSIS — Z20828 Contact with and (suspected) exposure to other viral communicable diseases: Secondary | ICD-10-CM

## 2022-07-15 DIAGNOSIS — R059 Cough, unspecified: Secondary | ICD-10-CM

## 2022-07-15 DIAGNOSIS — Z20822 Contact with and (suspected) exposure to covid-19: Secondary | ICD-10-CM | POA: Diagnosis not present

## 2022-07-15 DIAGNOSIS — R051 Acute cough: Secondary | ICD-10-CM

## 2022-07-15 LAB — POCT XPERT XPRESS SARS COVID-2/FLU/RSV
FLU A: NEGATIVE
FLU B: NEGATIVE
RSV RNA, PCR: NEGATIVE
SARS Coronavirus 2: NEGATIVE

## 2022-07-15 MED ORDER — SEMAGLUTIDE (1 MG/DOSE) 4 MG/3ML ~~LOC~~ SOPN: 1.0000 mg | PEN_INJECTOR | SUBCUTANEOUS | 2 refills | Status: DC

## 2022-07-15 MED ORDER — AMOXICILLIN-POT CLAVULANATE 875-125 MG PO TABS: 1.0000 | ORAL_TABLET | Freq: Two times a day (BID) | ORAL | 0 refills | Status: DC

## 2022-07-15 NOTE — Telephone Encounter (Signed)
Pt asked when she came by for covid test if we can switch her back to ozempic? Said mounjaro isn't supressing her appetite anymore, please advise

## 2022-07-16 ENCOUNTER — Encounter: Payer: Self-pay | Admitting: Family

## 2022-07-16 NOTE — Progress Notes (Signed)
  Subjective   CHIEF COMPLAINT  COVID Testing      REASON FOR VISIT  COVID Test Only   Objective  Lab Orders         POCT XPERT XPRESS SARS COVID-2/FLU/RSV [[id number]]    .COVID Negative Flu A  Negative Flu B Negative RSV Negative   Assessment & Plan  Diagnoses and all orders for this visit:  Contact with and (suspected) exposure to covid-19  Cough in adult -     POCT XPERT XPRESS SARS COVID-2/FLU/RSV [[id number]]   Sending antibiotics for pt.  Continue current other meds POC reviewed and agreed to.   Follow up as previously scheduled unless not improving.   Total time spent: 10 minutes  Miki Kins, FNP  07/15/2022

## 2022-07-19 ENCOUNTER — Other Ambulatory Visit: Payer: Self-pay | Admitting: Family

## 2022-07-19 DIAGNOSIS — I4891 Unspecified atrial fibrillation: Secondary | ICD-10-CM

## 2022-07-19 NOTE — Telephone Encounter (Signed)
Pt covid negative, rx augmentin sent

## 2022-08-11 DIAGNOSIS — H04223 Epiphora due to insufficient drainage, bilateral lacrimal glands: Secondary | ICD-10-CM | POA: Diagnosis not present

## 2022-08-11 DIAGNOSIS — H02889 Meibomian gland dysfunction of unspecified eye, unspecified eyelid: Secondary | ICD-10-CM | POA: Diagnosis not present

## 2022-08-11 DIAGNOSIS — M3501 Sicca syndrome with keratoconjunctivitis: Secondary | ICD-10-CM | POA: Diagnosis not present

## 2022-08-15 DIAGNOSIS — L409 Psoriasis, unspecified: Principal | ICD-10-CM

## 2022-08-15 MED ORDER — TALTZ AUTOINJECTOR 80 MG/ML SUBCUTANEOUS
SUBCUTANEOUS | 0 refills | 28.00000 days
Start: 2022-08-15 — End: ?

## 2022-08-17 DIAGNOSIS — L409 Psoriasis, unspecified: Principal | ICD-10-CM

## 2022-08-17 MED ORDER — TALTZ AUTOINJECTOR 80 MG/ML SUBCUTANEOUS
SUBCUTANEOUS | 0 refills | 28.00000 days | Status: CP
Start: 2022-08-17 — End: ?
  Filled 2022-08-22: qty 1, 28d supply, fill #0

## 2022-08-17 NOTE — Unmapped (Signed)
Short term refill provided to bridge patient to f/up appt later this month given severity of disease. She needs to keep appt for more refills

## 2022-08-17 NOTE — Unmapped (Signed)
[facility] Specialty Medication Onboarding    Specialty Medication: TALTZ AUTOINJECTOR 80 mg/mL Atin auto-injector (ixekizumab)  Prior Authorization: Approved   Financial Assistance: No - copay  <$25  Final Copay/Day Supply: $0 / 28 days    Insurance Restrictions: None     Notes to Pharmacist:   Credit Card on File: not applicable    The triage team has completed the benefits investigation and has determined that the patient is able to fill this medication at [facility]. Please contact the patient to complete the onboarding or follow up with the prescribing physician as needed.

## 2022-08-19 NOTE — Unmapped (Signed)
Last filled within past 6 months (12/15) - no onboarding required.    Patient reports she's had some flaring since being off therapy - needed appt for more refills. She denies any severe flaring and reports it's only recently starting to come back some on elbows and belly. She has used triamcinolone sparingly (doesn't feel strong need for it). Given her lack of severe flaring, it's reasonable to continue maintenance dosing.     Previously she was doing very well and was without flares - and only some small residual psoriasis on her elbows.     [facility] Clinical Assessment & Refill Coordination Note    Lisa Carlson, DOB: May 20, 1945  Phone: [phone] (home)     All above HIPAA information was verified with patient.     Was a Nurse, learning disability used for this call? No    Specialty Medication(s):   Inflammatory Disorders: Taltz     Current Outpatient Medications   Medication Sig Dispense Refill    albuterol (PROVENTIL HFA;VENTOLIN HFA) 90 mcg/actuation inhaler Inhale 2 puffs every six (6) hours as needed for wheezing.      amoxicillin-clavulanate (AUGMENTIN) 875-125 mg per tablet amoxicillin 875 mg-potassium clavulanate 125 mg tablet      apixaban (ELIQUIS) 5 mg Tab Take 5 mg by mouth Two (2) times a day.      atorvastatin (LIPITOR) 20 MG tablet Take 20 mg by mouth daily.       B-complex with vitamin C (TOTAL B W/C) tablet vitamin b complex with b12      baclofen (LIORESAL) 10 MG tablet Take 10 mg by mouth Two (2) times a day.       betamethasone, augmented, (DIPROLENE) 0.05 % ointment Apply to scaly rash twice daily until smooth, then stop 100 g 5    calcium carbonate (OS-CAL) 500 mg calcium (1,250 mg) chewable tablet Chew 0.5 tablets daily as needed.       camphor-menthoL (SARNA) 0.5-0.5 % lotion Apply as needed for itch 222 mL 3    cetirizine (ZYRTEC) 10 MG tablet Take 10 mg by mouth daily.       ciprofloxacin-dexamethasone (CIPRODEX) 0.3-0.1 % otic suspension Administer 1 drop into both ears daily as needed.      clobetasol (TEMOVATE) 0.05 % cream Apply 1 application topically Two (2) times a day.      clobetasoL (TEMOVATE) 0.05 % ointment Apply 1 application topically Two (2) times a day. 120 g 1    cyclobenzaprine (FLEXERIL) 10 MG tablet cyclobenzaprine 10 mg tablet      diltiazem (TIAZAC) 120 MG 24 hr capsule diltiazem CD 120 mg capsule,extended release 24 hr      empty container Misc Use as directed to dispose of Humira pens. 1 each 2    esomeprazole (NEXIUM) 40 MG capsule Take 40 mg by mouth every morning before breakfast.      fluconazole (DIFLUCAN) 100 MG tablet Take 100 mg by mouth daily.  0    fluocinolone (SYNALAR) 0.01 % external solution fluocinolone 0.01 % topical solution      fluocinolone acetonide oiL 0.01 % Drop Administer 1 drop into both ears Two (2) times a day. 20 mL 2    fluticasone (FLONASE) 50 mcg/actuation nasal spray 2 sprays into each nostril daily.       furosemide (LASIX) 20 MG tablet Take 20 mg by mouth Two (2) times a day.      gabapentin (NEURONTIN) 300 MG capsule Take 300 mg by mouth  Three (3) times a day.      hydroCHLOROthiazide (HYDRODIURIL) 25 MG tablet Take 25 mg by mouth daily.       HYDROcodone-acetaminophen (NORCO) 5-325 mg per tablet hydrocodone 5 mg-acetaminophen 325 mg tablet      hydrOXYzine (ATARAX) 25 MG tablet TAKE 1 TABLET BY MOUTH THREE TIMES A DAY AS NEEDED. 270 tablet 1    ibuprofen (ADVIL,MOTRIN) 800 MG tablet Take 800 mg by mouth every six (6) hours as needed for pain.       ipratropium (ATROVENT) 42 mcg (0.06 %) nasal spray 2 sprays into each nostril four (4) times a day.       ivermectin (STROMECTOL) 3 mg Tab Take 9 tablets by mouth and repeat in one week 18 tablet 0    ixekizumab (TALTZ AUTOINJECTOR) 80 mg/mL AtIn auto-injector Inject the contents of 1 pen (80 mg total) under the skin every twenty-eight (28) days. 1 mL 0    levothyroxine 25 mcg cap Take 50 mg by mouth once.      losartan-hydrochlorothiazide (HYZAAR) 50-12.5 mg per tablet losartan 50 mg-hydrochlorothiazide 12.5 mg tablet      melatonin 10 mg Tab Take by mouth.       meloxicam (MOBIC) 15 MG tablet meloxicam 15 mg tablet      montelukast (SINGULAIR) 10 mg tablet Take 10 mg by mouth nightly.       neomycin-bacitracin-polymyxin-hydrocortisone (CORTISPORIN) 3.5-400-10,000 mg-unit/g-1% ophthalmic ointment Two (2) times a day.      neomycin-polymyxin-hydrocortisone (CORTISPORIN) 3.5-10,000-1 mg/mL-unit/mL-% otic solution neomycin-polymyxin-hydrocort 3.5 mg/mL-10,000 unit/mL-1 % ear solution      nitroglycerin (NITROSTAT) 0.4 MG SL tablet Place 0.4 mg under the tongue every five (5) minutes as needed for chest pain.      nystatin (MYCOSTATIN) powder       PARoxetine (PAXIL) 20 MG tablet Take 20 mg by mouth daily.       PNV no.63-iron,carbonyl-FA-dha 27 mg iron- 800 mcg-200 mg cap Take 300 mg by mouth every morning before breakfast.       pramoxine (SARNA SENSITIVE) 1 % Lotn Apply 1 application topically two (2) times a day. 222 mL 6    predniSONE (DELTASONE) 5 MG tablet Take 5 mg by mouth Two (2) times a day.  0    pregabalin (LYRICA) 50 MG capsule Take 50 mg by mouth daily as needed.       promethazine (PHENERGAN) 25 MG tablet promethazine 25 mg tablet      ranitidine (ZANTAC) 150 MG tablet Take 150 mg by mouth Two (2) times a day.      ranolazine (RANEXA) 1,000 mg SR tablet Take 1,000 mg by mouth every morning before breakfast.       silver sulfaDIAZINE (SILVADENE, SSD) 1 % cream       SPIRIVA RESPIMAT 1.25 mcg/actuation Mist USE AS DIRECTED 1 INHALATION EVERY DAY      SYMBICORT 160-4.5 mcg/actuation inhaler INHALE 1 PUFF ORALLY EVERY 12 HOURS, RINSE MOUTH WELL AFTER EACH USELRH  1    traZODone (DESYREL) 100 MG tablet Take 100 mg by mouth nightly.       triamcinolone (KENALOG) 0.1 % ointment Apply a thick layer twice a day to psoriasis until smooth, then stop 454 g 3    triamcinolone (KENALOG) 0.1 % ointment APPLY TO AFFECTED AREA TWICE A DAY 454 g 1     No current facility-administered medications for this visit.        Changes to medications: Trishna reports no changes at this  time.    Allergies   Allergen Reactions    Codeine Itching, Hives, Other (See Comments) and Rash       Changes to allergies: No    SPECIALTY MEDICATION ADHERENCE     Taltz - 0 left    Medication Adherence    Patient reported X missed doses in the last month: all  Specialty Medication: Taltz          Specialty medication(s) dose(s) confirmed: Regimen is correct and unchanged.     Are there any concerns with adherence? No    Adherence counseling provided? Not needed    CLINICAL MANAGEMENT AND INTERVENTION      Clinical Benefit Assessment:    Do you feel the medicine is effective or helping your condition? Yes    Clinical Benefit counseling provided? Not needed    Adverse Effects Assessment:    Are you experiencing any side effects? No    Are you experiencing difficulty administering your medicine? No    Quality of Life Assessment:    Quality of Life    Rheumatology  Oncology  Dermatology  1. What impact has your specialty medication had on the symptoms of your skin condition (i.e. itchiness, soreness, stinging)?: Tremendous  2. What impact has your specialty medication had on your comfort level with your skin?: Tremendous  Cystic Fibrosis          How many days over the past month did your psoriasis  keep you from your normal activities? For example, brushing your teeth or getting up in the morning. 0    Have you discussed this with your provider? Not needed    Acute Infection Status:    Acute infections noted within Epic:  No active infections  Patient reported infection: None    Therapy Appropriateness:    Is therapy appropriate and patient progressing towards therapeutic goals? Yes, therapy is appropriate and should be continued    DISEASE/MEDICATION-SPECIFIC INFORMATION      For patients on injectable medications: Patient currently has 0 doses left.  Next injection is scheduled for asap (has been off ~ 5 months).    Chronic Inflammatory Diseases: Have you experienced any flares in the last month? Yes, mild, but due to being off therapy (needs appt for more)  Has this been reported to your provider? No    PATIENT SPECIFIC NEEDS     Does the patient have any physical, cognitive, or cultural barriers? No    Is the patient high risk? No    Did the patient require a clinical intervention? No    Does the patient require physician intervention or other additional services (i.e., nutrition, smoking cessation, social work)? No    SOCIAL DETERMINANTS OF HEALTH     At the [facility], we have learned that life circumstances - like trouble affording food, housing, utilities, or transportation can affect the health of many of our patients.   That is why we wanted to ask: are you currently experiencing any life circumstances that are negatively impacting your health and/or quality of life? Patient declined to answer    Social Determinants of Health     Financial Resource Strain: Not on file   Internet Connectivity: Not on file   Food Insecurity: Not on file   Tobacco Use: Medium Risk (01/26/2022)    Received from [facility]    Patient History     Smoking Tobacco Use: Former     Smokeless Tobacco Use: Never  Passive Exposure: Not on file   Housing/Utilities: Not on file   Alcohol Use: Not on file   Transportation Needs: Not on file   Substance Use: Not on file   Health Literacy: Not on file   Physical Activity: Not on file   Interpersonal Safety: Not on file   Stress: Not on file   Intimate Partner Violence: Not on file   Depression: Not on file   Social Connections: Not on file       Would you be willing to receive help with any of the needs that you have identified today? Not applicable       SHIPPING     Specialty Medication(s) to be Shipped:   Inflammatory Disorders: Taltz    Other medication(s) to be shipped: No additional medications requested for fill at this time     Changes to insurance: No    Delivery Scheduled: Yes, Expected medication delivery date: 5/13.     Medication will be delivered via Same Day Courier to the confirmed prescription address in [facility].    The patient will receive a drug information handout for each medication shipped and additional FDA Medication Guides as required.  Verified that patient has previously received a Conservation officer, historic buildings and a Surveyor, mining.    The patient or caregiver noted above participated in the development of this care plan and knows that they can request review of or adjustments to the care plan at any time.      All of the patient's questions and concerns have been addressed.    Lanney Gins   [facility] Specialty Pharmacist

## 2022-08-26 ENCOUNTER — Other Ambulatory Visit: Payer: Self-pay | Admitting: Family

## 2022-08-30 MED ORDER — TALTZ AUTOINJECTOR 80 MG/ML SUBCUTANEOUS
SUBCUTANEOUS | 11 refills | 28.00000 days
Start: 2022-08-30 — End: ?

## 2022-08-31 ENCOUNTER — Ambulatory Visit: Admit: 2022-08-31 | Discharge: 2022-09-01 | Payer: MEDICARE | Attending: Dermatology | Primary: Dermatology

## 2022-08-31 DIAGNOSIS — L409 Psoriasis, unspecified: Principal | ICD-10-CM

## 2022-08-31 DIAGNOSIS — L304 Erythema intertrigo: Principal | ICD-10-CM

## 2022-08-31 DIAGNOSIS — Z79899 Other long term (current) drug therapy: Principal | ICD-10-CM

## 2022-08-31 MED ORDER — TALTZ AUTOINJECTOR 80 MG/ML SUBCUTANEOUS
SUBCUTANEOUS | 11 refills | 28.00000 days | Status: CP
Start: 2022-08-31 — End: ?
  Filled 2022-09-20: qty 1, 28d supply, fill #0

## 2022-08-31 MED ORDER — NYSTATIN 100,000 UNIT/GRAM TOPICAL CREAM
Freq: Two times a day (BID) | TOPICAL | 5 refills | 0.00000 days | Status: CP
Start: 2022-08-31 — End: ?

## 2022-08-31 NOTE — Unmapped (Unsigned)
Dermatology Note     Assessment and Plan:    Ran out of taltz more than a month ago, overdue for tb test   Benign Lesions/ Findings:   {derm skin check benign A&P:75424}  - Reassurance provided regarding the benign appearance of lesions noted on exam today; no treatment is indicated in the absence of symptoms/changes.  - Reinforced importance of photoprotective strategies including liberal and frequent sunscreen use of a broad-spectrum SPF 30 or greater, use of protective clothing, and sun avoidance for prevention of cutaneous malignancy and photoaging.  Counseled patient on the importance of regular self-skin monitoring as well as routine clinical skin examinations as scheduled.     Personal history of {derm skin check cancer list:75440} ***  - No evidence of recurrence at this time.  - Discussed maintaining vigilance and counseled on sun protection as above.    There are no diagnoses linked to this encounter.    The patient was advised to call for an appointment should any new, changing, or symptomatic lesions develop.     RTC: No follow-ups on file. or sooner as needed   _________________________________________________________________      Chief Complaint     Chief Complaint   Patient presents with    Follow-up     On psoriasis and needs a refill for Taltz        HPI     Lisa Carlson is a 77 y.o. female who presents as a returning patient (last seen 01/19/2021) to Dermatology for follow up of psoriasis. Psoriasis has done well on Taltz. No side effects    The patient denies any other new or changing lesions or areas of concern.     Pertinent Past Medical History     {derm PMH :75456}    Problem List    None    ***    Family History:   {derm skin check melanoma history:75452}    Past Medical History, Family History, Social History, Medication List, Allergies, and Problem List were reviewed in the rooming section of Epic.     ROS: Other than symptoms mentioned in the HPI, {derm skin check ros:75408::no fevers, chills, or other skin complaints}    Physical Examination     GENERAL: Well-appearing female in no acute distress, resting comfortably.  {PE systems:75418::NEURO: Alert and oriented, answers questions appropriately}  {PE extent:75514}  {PE list:75421}  ***    {PE limitations:75419::All areas not commented on are within normal limits or unremarkable}      (Approved Template 12/23/2019)

## 2022-09-09 DIAGNOSIS — Z79899 Other long term (current) drug therapy: Secondary | ICD-10-CM | POA: Diagnosis not present

## 2022-09-13 LAB — QUANTIFERON TB GOLD PLUS
QUANTIFERON MITOGEN VALUE: >10 [IU]/mL
QUANTIFERON NIL VALUE: 0.01 [IU]/mL
QUANTIFERON TB GOLD: NEGATIVE
QUANTIFERON TB1 AG VALUE: 0.01 [IU]/mL
QUANTIFERON TB2 AG VALUE: 0.01 [IU]/mL

## 2022-09-16 NOTE — Unmapped (Signed)
Quant gold negative, continue therapy as planned

## 2022-10-09 ENCOUNTER — Other Ambulatory Visit: Payer: Self-pay | Admitting: Family

## 2022-10-09 DIAGNOSIS — I2510 Atherosclerotic heart disease of native coronary artery without angina pectoris: Secondary | ICD-10-CM

## 2022-10-11 ENCOUNTER — Other Ambulatory Visit: Payer: Self-pay | Admitting: Family

## 2022-10-11 DIAGNOSIS — M5440 Lumbago with sciatica, unspecified side: Secondary | ICD-10-CM

## 2022-10-17 NOTE — Unmapped (Signed)
[facility] Refill Coordination Note    Specialty Medication(s) to be Shipped:   Inflammatory Disorders: Taltz 80mg /ml Inj      Other medication(s) to be shipped: No additional medications requested for fill at this time     Lisa Carlson, DOB: 1945/07/13  Phone: [phone] (home)     All above HIPAA information was verified with patient.     Was a Nurse, learning disability used for this call? No    Completed refill call assessment today to schedule patient's medication shipment from the [facility] [phone]).  All relevant notes have been reviewed.     Specialty medication(s) and dose(s) confirmed: Regimen is correct and unchanged.   Changes to medications: Linita reports no changes at this time.  Changes to insurance: No  New side effects reported not previously addressed with a pharmacist or physician: None reported  Questions for the pharmacist: No    Confirmed patient received a Conservation officer, historic buildings and a Surveyor, mining with first shipment. The patient will receive a drug information handout for each medication shipped and additional FDA Medication Guides as required.       DISEASE/MEDICATION-SPECIFIC INFORMATION        For patients on injectable medications: Patient currently has 0 doses left.  Next injection is scheduled for 10/20/22.    SPECIALTY MEDICATION ADHERENCE     Medication Adherence    Patient reported X missed doses in the last month: 0  Specialty Medication: ixekizumab (TALTZ AUTOINJECTOR) 80 mg/mL AtIn auto-injector  Patient is on additional specialty medications: No  Informant: patient      Were doses missed due to medication being on hold? No    Taltz 80mg /ml Inj: 0 days of medicine on hand     REFERRAL TO PHARMACIST     Referral to the pharmacist: Not needed    Lisa Carlson     Shipping address confirmed in Epic.     Delivery Scheduled: Yes, Expected medication delivery date: 10/20/22.     Medication will be delivered via Same Day Courier to the prescription address in Epic WAM.    [name]   [facility] Specialty Technician

## 2022-10-19 MED FILL — TALTZ AUTOINJECTOR 80 MG/ML SUBCUTANEOUS: SUBCUTANEOUS | 28 days supply | Qty: 1 | Fill #1

## 2022-11-01 ENCOUNTER — Ambulatory Visit (INDEPENDENT_AMBULATORY_CARE_PROVIDER_SITE_OTHER): Payer: Medicare HMO | Admitting: Family

## 2022-11-01 VITALS — BP 130/82 | HR 94 | Ht 64.0 in | Wt 292.0 lb

## 2022-11-01 DIAGNOSIS — I480 Paroxysmal atrial fibrillation: Secondary | ICD-10-CM

## 2022-11-01 DIAGNOSIS — R5383 Other fatigue: Secondary | ICD-10-CM | POA: Diagnosis not present

## 2022-11-01 DIAGNOSIS — R7303 Prediabetes: Secondary | ICD-10-CM

## 2022-11-01 DIAGNOSIS — I25118 Atherosclerotic heart disease of native coronary artery with other forms of angina pectoris: Secondary | ICD-10-CM

## 2022-11-01 DIAGNOSIS — I10 Essential (primary) hypertension: Secondary | ICD-10-CM

## 2022-11-01 DIAGNOSIS — E559 Vitamin D deficiency, unspecified: Secondary | ICD-10-CM | POA: Diagnosis not present

## 2022-11-01 DIAGNOSIS — J4489 Other specified chronic obstructive pulmonary disease: Secondary | ICD-10-CM

## 2022-11-01 DIAGNOSIS — M5432 Sciatica, left side: Secondary | ICD-10-CM

## 2022-11-01 DIAGNOSIS — E782 Mixed hyperlipidemia: Secondary | ICD-10-CM | POA: Diagnosis not present

## 2022-11-01 DIAGNOSIS — E039 Hypothyroidism, unspecified: Secondary | ICD-10-CM | POA: Diagnosis not present

## 2022-11-01 DIAGNOSIS — M5416 Radiculopathy, lumbar region: Secondary | ICD-10-CM

## 2022-11-01 DIAGNOSIS — E538 Deficiency of other specified B group vitamins: Secondary | ICD-10-CM

## 2022-11-01 DIAGNOSIS — M5431 Sciatica, right side: Secondary | ICD-10-CM

## 2022-11-01 NOTE — Progress Notes (Signed)
Established Patient Office Visit  Subjective:  Patient ID: Patricia Walker, female    DOB: 1945-06-04  Age: 77 y.o. MRN: [id number]  Chief Complaint  Patient presents with   Leg Pain    Leg issues and etc     Leg Pain  The incident occurred more than 1 week ago. There was no injury mechanism. The pain is present in the left leg, left hip, right hip and right leg. The quality of the pain is described as burning, shooting and stabbing. The pain is severe. The pain has been Constant since onset. Associated symptoms include an inability to bear weight, a loss of sensation, muscle weakness, numbness and tingling. She reports no foreign bodies present. The symptoms are aggravated by movement and weight bearing. She has tried acetaminophen, NSAIDs, rest, non-weight bearing, ice, heat and elevation for the symptoms. The treatment provided no relief.   She has not been in to the office for quite a while, needs labs and refills as well.   No other concerns at this time.   Past Medical History:  Diagnosis Date   Anxiety    Arthritis    Asthma    COPD exacerbation (HCC) 10/08/2020   Coronary artery disease    mild, nonobstructive   Depression    Dyspnea    on exertion   Dysrhythmia    Atrial Fibrillation   GERD (gastroesophageal reflux disease)    Heart murmur    Hyperlipidemia    Hypertension    Hypothyroidism    MI, old 2016   nonobstructive CAD by Cath   Morbid obesity (HCC)    Persistent atrial fibrillation (HCC)    Pneumonia 06/2018   and RSV   Psoriasis    Sleep apnea    compliant with CPAP   Thyroid disease     Past Surgical History:  Procedure Laterality Date   ARTERY BIOPSY Right 07/21/2017   Procedure: BIOPSY TEMPORAL ARTERY;  Surgeon: Renford Dills, MD;  Location: [facility];  Service: Vascular;  Laterality: Right;   CARDIAC CATHETERIZATION     CARDIOVERSION Right 09/01/2016   Procedure: Cardioversion;  Surgeon: Laurier Nancy, MD;  Location: [facility];  Service:  Cardiovascular;  Laterality: Right;   CARDIOVERSION N/A 09/09/2016   Procedure: Cardioversion;  Surgeon: Laurier Nancy, MD;  Location: [facility];  Service: Cardiovascular;  Laterality: N/A;   CATARACT EXTRACTION W/PHACO Right 03/26/2019   Procedure: CATARACT EXTRACTION PHACO AND INTRAOCULAR LENS PLACEMENT (IOC) RIGHT 6.45, 00:39.9;  Surgeon: Galen Manila, MD;  Location: [facility];  Service: Ophthalmology;  Laterality: Right;   CATARACT EXTRACTION W/PHACO Left 04/16/2019   Procedure: CATARACT EXTRACTION PHACO AND INTRAOCULAR LENS PLACEMENT (IOC) LEFT;   3.14, 00:24.9;  Surgeon: Galen Manila, MD;  Location: [facility];  Service: Ophthalmology;  Laterality: Left;  sleep apnea-CPAP   COLONOSCOPY WITH PROPOFOL N/A 08/28/2019   Procedure: COLONOSCOPY WITH PROPOFOL;  Surgeon: Toledo, Boykin Nearing, MD;  Location: [facility];  Service: Gastroenterology;  Laterality: N/A;   ELECTROPHYSIOLOGIC STUDY N/A 02/01/2016   Procedure: CARDIOVERSION;  Surgeon: Laurier Nancy, MD;  Location: [facility];  Service: Cardiovascular;  Laterality: N/A;   ESOPHAGEAL DILATION     ESOPHAGOGASTRODUODENOSCOPY (EGD) WITH PROPOFOL N/A 02/06/2017   Procedure: ESOPHAGOGASTRODUODENOSCOPY (EGD) WITH PROPOFOL;  Surgeon: Wyline Mood, MD;  Location: [facility] END[facility];  Service: Gastroenterology;  Laterality: N/A;   ESOPHAGOGASTRODUODENOSCOPY (EGD) WITH PROPOFOL N/A 08/28/2019   Procedure: ESOPHAGOGASTRODUODENOSCOPY (EGD) WITH PROPOFOL;  Surgeon: Norma Fredrickson, Boykin Nearing, MD;  Location:  [facility];  Service: Gastroenterology;  Laterality: N/A;   EUS N/A 02/16/2017   Procedure: FULL UPPER ENDOSCOPIC ULTRASOUND (EUS) RADIAL;  Surgeon: Doren Custard, MD;  Location: [facility];  Service: Gastroenterology;  Laterality: N/A;   LEFT HEART CATH AND CORONARY ANGIOGRAPHY N/A 09/08/2016   Procedure: Left Heart Cath and Coronary Angiography;  Surgeon: Laurier Nancy, MD;  Location: [facility] IN[facility] LAB;  Service:  Cardiovascular;  Laterality: N/A;   US ECHOCARDIOGRAPHY      Social History   Socioeconomic History   Marital status: Divorced    Spouse name: Not on file   Number of children: Not on file   Years of education: Not on file   Highest education level: Not on file  Occupational History   Not on file  Tobacco Use   Smoking status: Former    Current packs/day: 0.00    Types: Cigarettes    Quit date: 02/01/2013    Years since quitting: 9.7   Smokeless tobacco: Never  Vaping Use   Vaping status: Never Used  Substance and Sexual Activity   Alcohol use: No   Drug use: No   Sexual activity: Not on file  Other Topics Concern   Not on file  Social History Narrative   Lives in [place] with multiple family members   unemployed   Social Determinants of Health   Financial Resource Strain: Not on file  Food Insecurity: Not on file  Transportation Needs: Not on file  Physical Activity: Not on file  Stress: Not on file  Social Connections: Not on file  Intimate Partner Violence: Not on file    Family History  Problem Relation Age of Onset   Breast cancer Paternal Aunt    Other Father    Heart attack Father     Allergies  Allergen Reactions   Codeine Hives, Itching and Rash   Other Itching and Other (See Comments)    States antibiotic in the past caused itching but can not remember name    Review of Systems  Musculoskeletal:        Leg pain  Neurological:  Positive for tingling and numbness.       Objective:   BP 130/82   Pulse 94   Ht 5\' 4"  (1.626 m)   Wt 292 lb (132.5 kg)   SpO2 97%   BMI 50.12 kg/m   Vitals:   11/01/22 1008  BP: 130/82  Pulse: 94  Height: 5\' 4"  (1.626 m)  Weight: 292 lb (132.5 kg)  SpO2: 97%  BMI (Calculated): 50.1    Physical Exam Vitals reviewed.  Constitutional:      General: She is not in acute distress.    Appearance: Normal appearance. She is obese. She is ill-appearing. She is not toxic-appearing.  Neurological:      Mental Status: She is alert.  Psychiatric:        Mood and Affect: Mood normal.        Behavior: Behavior normal.        Thought Content: Thought content normal.        Judgment: Judgment normal.      Results for orders placed or performed in visit on 11/01/22  Lipid panel  Result Value Ref Range   Cholesterol, Total 215 (H) 100 - 199 mg/dL   Triglycerides 161 (H) 0 - 149 mg/dL   HDL 41 >09 mg/dL   VLDL Cholesterol Cal 28 5 - 40 mg/dL   LDL Chol Calc (NIH) 604 (  H) 0 - 99 mg/dL   Chol/HDL Ratio 5.2 (H) 0.0 - 4.4 ratio  VITAMIN D 25 Hydroxy (Vit-D Deficiency, Fractures)  Result Value Ref Range   Vit D, 25-Hydroxy 23.8 (L) 30.0 - 100.0 ng/mL  CBC With Differential  Result Value Ref Range   WBC 6.1 3.4 - 10.8 x10E3/uL   RBC 5.67 (H) 3.77 - 5.28 x10E6/uL   Hemoglobin 15.2 11.1 - 15.9 g/dL   Hematocrit 62.1 (H) 30.8 - 46.6 %   MCV 84 79 - 97 fL   MCH 26.8 26.6 - 33.0 pg   MCHC 31.9 31.5 - 35.7 g/dL   RDW 65.7 84.6 - 96.2 %   Neutrophils 65 Not Estab. %   Lymphs 22 Not Estab. %   Monocytes 8 Not Estab. %   Eos 3 Not Estab. %   Basos 1 Not Estab. %   Neutrophils Absolute 4.0 1.4 - 7.0 x10E3/uL   Lymphocytes Absolute 1.3 0.7 - 3.1 x10E3/uL   Monocytes Absolute 0.5 0.1 - 0.9 x10E3/uL   EOS (ABSOLUTE) 0.2 0.0 - 0.4 x10E3/uL   Basophils Absolute 0.1 0.0 - 0.2 x10E3/uL   Immature Granulocytes 1 Not Estab. %   Immature Grans (Abs) 0.0 0.0 - 0.1 x10E3/uL  CMP14+EGFR  Result Value Ref Range   Glucose 128 (H) 70 - 99 mg/dL   BUN 17 8 - 27 mg/dL   Creatinine, Ser 9.52 0.57 - 1.00 mg/dL   eGFR 61 >84 XL/KGM/0.10   BUN/Creatinine Ratio 18 12 - 28   Sodium 139 134 - 144 mmol/L   Potassium 4.7 3.5 - 5.2 mmol/L   Chloride 104 96 - 106 mmol/L   CO2 21 20 - 29 mmol/L   Calcium 9.5 8.7 - 10.3 mg/dL   Total Protein 6.8 6.0 - 8.5 g/dL   Albumin 4.1 3.8 - 4.8 g/dL   Globulin, Total 2.7 1.5 - 4.5 g/dL   Bilirubin Total 0.5 0.0 - 1.2 mg/dL   Alkaline Phosphatase 87 44 - 121 IU/L   AST  23 0 - 40 IU/L   ALT 20 0 - 32 IU/L  TSH  Result Value Ref Range   TSH 4.840 (H) 0.450 - 4.500 uIU/mL  Hemoglobin A1c  Result Value Ref Range   Hgb A1c MFr Bld 5.9 (H) 4.8 - 5.6 %   Est. average glucose Bld gHb Est-mCnc 123 mg/dL  Vitamin U72  Result Value Ref Range   Vitamin B-12 522 232 - 1,245 pg/mL  Iron, TIBC and Ferritin Panel  Result Value Ref Range   Total Iron Binding Capacity 356 250 - 450 ug/dL   UIBC 536 644 - 034 ug/dL   Iron 58 27 - 742 ug/dL   Iron Saturation 16 15 - 55 %   Ferritin 88 15 - 150 ng/mL    Recent Results (from the past 2160 hour(s))  Lipid panel     Status: Abnormal   Collection Time: 11/01/22 10:35 AM  Result Value Ref Range   Cholesterol, Total 215 (H) 100 - 199 mg/dL   Triglycerides 595 (H) 0 - 149 mg/dL   HDL 41 >63 mg/dL   VLDL Cholesterol Cal 28 5 - 40 mg/dL   LDL Chol Calc (NIH) 875 (H) 0 - 99 mg/dL   Chol/HDL Ratio 5.2 (H) 0.0 - 4.4 ratio    Comment:  T. Chol/HDL Ratio                                             Men  Women                               1/2 Avg.Risk  3.4    3.3                                   Avg.Risk  5.0    4.4                                2X Avg.Risk  9.6    7.1                                3X Avg.Risk 23.4   11.0   VITAMIN D 25 Hydroxy (Vit-D Deficiency, Fractures)     Status: Abnormal   Collection Time: 11/01/22 10:35 AM  Result Value Ref Range   Vit D, 25-Hydroxy 23.8 (L) 30.0 - 100.0 ng/mL    Comment: Vitamin D deficiency has been defined by the Institute of Medicine and an Endocrine Society practice guideline as a level of serum 25-OH vitamin D less than 20 ng/mL (1,2). The Endocrine Society went on to further define vitamin D insufficiency as a level between 21 and 29 ng/mL (2). 1. IOM (Institute of Medicine). 2010. Dietary reference    intakes for calcium and D. [place]: The    [employer]. 2. Holick MF, Binkley Lyman, Bischoff-Ferrari HA, et al.     Evaluation, treatment, and prevention of vitamin D    deficiency: an Endocrine Society clinical practice    guideline. JCEM. 2011 Jul; 96(7):1911-30.   CBC With Differential     Status: Abnormal   Collection Time: 11/01/22 10:35 AM  Result Value Ref Range   WBC 6.1 3.4 - 10.8 x10E3/uL   RBC 5.67 (H) 3.77 - 5.28 x10E6/uL   Hemoglobin 15.2 11.1 - 15.9 g/dL   Hematocrit 24.4 (H) 01.0 - 46.6 %   MCV 84 79 - 97 fL   MCH 26.8 26.6 - 33.0 pg   MCHC 31.9 31.5 - 35.7 g/dL   RDW 27.2 53.6 - 64.4 %   Neutrophils 65 Not Estab. %   Lymphs 22 Not Estab. %   Monocytes 8 Not Estab. %   Eos 3 Not Estab. %   Basos 1 Not Estab. %   Neutrophils Absolute 4.0 1.4 - 7.0 x10E3/uL   Lymphocytes Absolute 1.3 0.7 - 3.1 x10E3/uL   Monocytes Absolute 0.5 0.1 - 0.9 x10E3/uL   EOS (ABSOLUTE) 0.2 0.0 - 0.4 x10E3/uL   Basophils Absolute 0.1 0.0 - 0.2 x10E3/uL   Immature Granulocytes 1 Not Estab. %   Immature Grans (Abs) 0.0 0.0 - 0.1 x10E3/uL    Comment: **Effective November 07, 2022, profile [id number] CBC/Differential**   (No Platelet) will be made non-orderable. [facility] Offers:   [id number] CBC With Differential/Platelet   CMP14+EGFR     Status: Abnormal   Collection Time: 11/01/22 10:35 AM  Result Value Ref Range   Glucose 128 (H) 70 -  99 mg/dL   BUN 17 8 - 27 mg/dL   Creatinine, Ser 5.18 0.57 - 1.00 mg/dL   eGFR 61 >84 ZY/SAY/3.01   BUN/Creatinine Ratio 18 12 - 28   Sodium 139 134 - 144 mmol/L   Potassium 4.7 3.5 - 5.2 mmol/L   Chloride 104 96 - 106 mmol/L   CO2 21 20 - 29 mmol/L   Calcium 9.5 8.7 - 10.3 mg/dL   Total Protein 6.8 6.0 - 8.5 g/dL   Albumin 4.1 3.8 - 4.8 g/dL   Globulin, Total 2.7 1.5 - 4.5 g/dL   Bilirubin Total 0.5 0.0 - 1.2 mg/dL   Alkaline Phosphatase 87 44 - 121 IU/L   AST 23 0 - 40 IU/L   ALT 20 0 - 32 IU/L  TSH     Status: Abnormal   Collection Time: 11/01/22 10:35 AM  Result Value Ref Range   TSH 4.840 (H) 0.450 - 4.500 uIU/mL  Hemoglobin A1c     Status: Abnormal   Collection  Time: 11/01/22 10:35 AM  Result Value Ref Range   Hgb A1c MFr Bld 5.9 (H) 4.8 - 5.6 %    Comment:          Prediabetes: 5.7 - 6.4          Diabetes: >6.4          Glycemic control for adults with diabetes: <7.0    Est. average glucose Bld gHb Est-mCnc 123 mg/dL  Vitamin S01     Status: None   Collection Time: 11/01/22 10:35 AM  Result Value Ref Range   Vitamin B-12 522 232 - 1,245 pg/mL  Iron, TIBC and Ferritin Panel     Status: None   Collection Time: 11/01/22 10:35 AM  Result Value Ref Range   Total Iron Binding Capacity 356 250 - 450 ug/dL   UIBC 093 235 - 573 ug/dL   Iron 58 27 - 220 ug/dL   Iron Saturation 16 15 - 55 %   Ferritin 88 15 - 150 ng/mL       Assessment & Plan:   Problem List Items Addressed This Visit       Active Problems   Atrial fibrillation [facility])    Patient is seen by Cardiology, who manage this condition.  She is well controlled with current therapy.   Will defer to them for further changes to plan of care.       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  She is well controlled with current therapy.   Will defer to them for further changes to plan of care.       Hyperlipidemia, unspecified - Primary    Checking labs today.  Continue current therapy for lipid control. Will modify as needed based on labwork results.       Relevant Orders   Lipid panel (Completed)   CBC With Differential (Completed)   CMP14+EGFR (Completed)   Hypertension   Relevant Orders   CBC With Differential (Completed)   CMP14+EGFR (Completed)   Vitamin D deficiency    Checking labs today.  Will continue supplements as needed.       Relevant Orders   VITAMIN D 25 Hydroxy (Vit-D Deficiency, Fractures) (Completed)   CBC With Differential (Completed)   CMP14+EGFR (Completed)   Obstructive chronic bronchitis without exacerbation    Patient stable.   Well controlled with current therapy.  Continue current meds.       Other Visit Diagnoses     B12 deficiency due to diet       Checking labs today.  Will continue supplements as needed.   Relevant Orders   CBC With Differential (Completed)   CMP14+EGFR (Completed)   Vitamin B12 (Completed)   Prediabetes       Checking labs today Patient counseled on dietary choices and verbalized understanding.   Relevant Orders   CBC With Differential (Completed)   CMP14+EGFR (Completed)   Hemoglobin A1c (Completed)   Hypothyroidism (acquired)       Relevant Orders   CBC With Differential (Completed)   CMP14+EGFR (Completed)   TSH (Completed)   Other fatigue       Relevant Orders   CBC With Differential (Completed)   CMP14+EGFR (Completed)   Iron, TIBC and Ferritin Panel (Completed)   Obesity, morbid (HCC)   (Chronic)     Bilateral sciatica       Relevant Orders   DG Lumbar Spine 2-3 Views   Lumbar radiculopathy       xr of LS Spine today.  Will call with results.  Sending refills for muscle relaxer.       Return in about 1 week (around 11/08/2022) for F/U.   Total time spent: 30 minutes  Miki Kins, FNP  11/01/2022   This document [place] have been prepared by [facility] Voice Recognition software and as such [place] include unintentional dictation errors.

## 2022-11-02 LAB — CBC WITH DIFFERENTIAL
Basophils Absolute: 0.1 10*3/uL (ref 0.0–0.2)
Basos: 1 %
EOS (ABSOLUTE): 0.2 10*3/uL (ref 0.0–0.4)
Eos: 3 %
Hematocrit: 47.7 % — ABNORMAL HIGH (ref 34.0–46.6)
Hemoglobin: 15.2 g/dL (ref 11.1–15.9)
Immature Grans (Abs): 0 10*3/uL (ref 0.0–0.1)
Immature Granulocytes: 1 %
Lymphocytes Absolute: 1.3 10*3/uL (ref 0.7–3.1)
Lymphs: 22 %
MCH: 26.8 pg (ref 26.6–33.0)
MCHC: 31.9 g/dL (ref 31.5–35.7)
MCV: 84 fL (ref 79–97)
Monocytes Absolute: 0.5 10*3/uL (ref 0.1–0.9)
Monocytes: 8 %
Neutrophils Absolute: 4 10*3/uL (ref 1.4–7.0)
Neutrophils: 65 %
RBC: 5.67 x10E6/uL — ABNORMAL HIGH (ref 3.77–5.28)
RDW: 13.2 % (ref 11.7–15.4)
WBC: 6.1 10*3/uL (ref 3.4–10.8)

## 2022-11-02 LAB — CMP14+EGFR
ALT: 20 IU/L (ref 0–32)
AST: 23 IU/L (ref 0–40)
Albumin: 4.1 g/dL (ref 3.8–4.8)
Alkaline Phosphatase: 87 IU/L (ref 44–121)
BUN/Creatinine Ratio: 18 (ref 12–28)
BUN: 17 mg/dL (ref 8–27)
Bilirubin Total: 0.5 mg/dL (ref 0.0–1.2)
CO2: 21 mmol/L (ref 20–29)
Calcium: 9.5 mg/dL (ref 8.7–10.3)
Chloride: 104 mmol/L (ref 96–106)
Creatinine, Ser: 0.97 mg/dL (ref 0.57–1.00)
Globulin, Total: 2.7 g/dL (ref 1.5–4.5)
Glucose: 128 mg/dL — ABNORMAL HIGH (ref 70–99)
Potassium: 4.7 mmol/L (ref 3.5–5.2)
Sodium: 139 mmol/L (ref 134–144)
Total Protein: 6.8 g/dL (ref 6.0–8.5)
eGFR: 61 mL/min/{1.73_m2} (ref >59)

## 2022-11-02 LAB — LIPID PANEL
Chol/HDL Ratio: 5.2 ratio — ABNORMAL HIGH (ref 0.0–4.4)
Cholesterol, Total: 215 mg/dL — ABNORMAL HIGH (ref 100–199)
HDL: 41 mg/dL (ref >39)
LDL Chol Calc (NIH): 146 mg/dL — ABNORMAL HIGH (ref 0–99)
Triglycerides: 153 mg/dL — ABNORMAL HIGH (ref 0–149)
VLDL Cholesterol Cal: 28 mg/dL (ref 5–40)

## 2022-11-02 LAB — VITAMIN D 25 HYDROXY (VIT D DEFICIENCY, FRACTURES): Vit D, 25-Hydroxy: 23.8 ng/mL — ABNORMAL LOW (ref 30.0–100.0)

## 2022-11-02 LAB — IRON,TIBC AND FERRITIN PANEL
Ferritin: 88 ng/mL (ref 15–150)
Iron Saturation: 16 % (ref 15–55)
Iron: 58 ug/dL (ref 27–139)
Total Iron Binding Capacity: 356 ug/dL (ref 250–450)
UIBC: 298 ug/dL (ref 118–369)

## 2022-11-02 LAB — TSH: TSH: 4.84 u[IU]/mL — ABNORMAL HIGH (ref 0.450–4.500)

## 2022-11-02 LAB — HEMOGLOBIN A1C
Est. average glucose Bld gHb Est-mCnc: 123 mg/dL
Hgb A1c MFr Bld: 5.9 % — ABNORMAL HIGH (ref 4.8–5.6)

## 2022-11-02 LAB — VITAMIN B12: Vitamin B-12: 522 pg/mL (ref 232–1245)

## 2022-11-08 ENCOUNTER — Other Ambulatory Visit: Payer: Self-pay

## 2022-11-08 ENCOUNTER — Other Ambulatory Visit: Payer: Self-pay | Admitting: Family

## 2022-11-08 MED ORDER — ATORVASTATIN CALCIUM 40 MG PO TABS: 40.0000 mg | ORAL_TABLET | Freq: Every day | ORAL | 11 refills | Status: DC

## 2022-11-08 MED ORDER — VITAMIN D (ERGOCALCIFEROL) 1.25 MG (50000 UNIT) PO CAPS: 50000.0000 [IU] | ORAL_CAPSULE | ORAL | 1 refills | Status: DC

## 2022-11-12 ENCOUNTER — Encounter: Payer: Self-pay | Admitting: Family

## 2022-11-12 DIAGNOSIS — J4489 Other specified chronic obstructive pulmonary disease: Secondary | ICD-10-CM | POA: Insufficient documentation

## 2022-11-12 NOTE — Assessment & Plan Note (Signed)
Checking labs today.  Continue current therapy for lipid control. Will modify as needed based on labwork results.  

## 2022-11-12 NOTE — Assessment & Plan Note (Signed)
Patient is seen by Cardiology, who manage this condition.  She is well controlled with current therapy.   Will defer to them for further changes to plan of care.

## 2022-11-12 NOTE — Assessment & Plan Note (Signed)
Patient stable.  Well controlled with current therapy.   Continue current meds.  

## 2022-11-12 NOTE — Assessment & Plan Note (Signed)
Checking labs today.  Will continue supplements as needed.  

## 2022-11-16 NOTE — Unmapped (Signed)
[facility] Refill Coordination Note    Specialty Medication(s) to be Shipped:   Inflammatory Disorders: Taltz    Other medication(s) to be shipped: No additional medications requested for fill at this time     Lisa Carlson, DOB: 03/22/1946  Phone: [phone] (home)       All above HIPAA information was verified with patient.     Was a Nurse, learning disability used for this call? No    Completed refill call assessment today to schedule patient's medication shipment from the [facility] [phone]).  All relevant notes have been reviewed.     Specialty medication(s) and dose(s) confirmed: Regimen is correct and unchanged.   Changes to medications: Lisa Carlson reports no changes at this time.  Changes to insurance: No  New side effects reported not previously addressed with a pharmacist or physician: None reported  Questions for the pharmacist: No    Confirmed patient received a Conservation officer, historic buildings and a Surveyor, mining with first shipment. The patient will receive a drug information handout for each medication shipped and additional FDA Medication Guides as required.       DISEASE/MEDICATION-SPECIFIC INFORMATION        For patients on injectable medications: Patient currently has 0 doses left.  Next injection is scheduled for 11/27/22.    SPECIALTY MEDICATION ADHERENCE     Medication Adherence    Patient reported X missed doses in the last month: 0  Specialty Medication: ixekizumab (TALTZ AUTOINJECTOR) 80 mg/mL AtIn auto-injecto  Patient is on additional specialty medications: No  Patient is on more than two specialty medications: No  Any gaps in refill history greater than 2 weeks in the last 3 months: no  Demonstrates understanding of importance of adherence: yes  Informant: patient              Were doses missed due to medication being on hold? No    TALTZ AUTOINJECTOR 80   mg/ml: 0 days of medicine on hand       REFERRAL TO PHARMACIST     Referral to the pharmacist: Not needed      [facility]     Shipping address confirmed in Epic.       Delivery Scheduled: Yes, Expected medication delivery date: 11/22/22.     Medication will be delivered via Same Day Courier to the prescription address in Epic WAM.    Lisa Carlson   [facility] Specialty Technician

## 2022-11-22 ENCOUNTER — Other Ambulatory Visit: Payer: Self-pay | Admitting: Internal Medicine

## 2022-11-22 ENCOUNTER — Other Ambulatory Visit: Payer: Self-pay | Admitting: Family

## 2022-11-22 MED FILL — TALTZ AUTOINJECTOR 80 MG/ML SUBCUTANEOUS: SUBCUTANEOUS | 28 days supply | Qty: 1 | Fill #2

## 2022-12-13 ENCOUNTER — Telehealth: Payer: Self-pay

## 2022-12-14 ENCOUNTER — Telehealth: Payer: Self-pay | Admitting: Family

## 2022-12-14 NOTE — Telephone Encounter (Signed)
Pt states she spoke with someone yesterday about switching to [facility], Ozempic was causing reactions.

## 2022-12-15 ENCOUNTER — Other Ambulatory Visit: Payer: Self-pay

## 2022-12-15 ENCOUNTER — Other Ambulatory Visit: Payer: Self-pay | Admitting: Family

## 2022-12-15 MED ORDER — MOUNJARO 2.5 MG/0.5ML ~~LOC~~ SOAJ: 2.5000 mg | SUBCUTANEOUS | 1 refills | Status: DC

## 2022-12-15 NOTE — Unmapped (Signed)
[facility] Refill Coordination Note    Specialty Medication(s) to be Shipped:   Inflammatory Disorders: Taltz    Other medication(s) to be shipped: No additional medications requested for fill at this time     Lisa Carlson, DOB: [id number]-Robinette-1947  Phone: [phone] (home)       All above HIPAA information was verified with patient.     Was a Nurse, learning disability used for this call? No    Completed refill call assessment today to schedule patient's medication shipment from the [facility] [phone]).  All relevant notes have been reviewed.     Specialty medication(s) and dose(s) confirmed: Regimen is correct and unchanged.   Changes to medications: Lisa Carlson reports no changes at this time.  Changes to insurance: No  New side effects reported not previously addressed with a pharmacist or physician: None reported  Questions for the pharmacist: No    Confirmed patient received a Conservation officer, historic buildings and a Surveyor, mining with first shipment. The patient will receive a drug information handout for each medication shipped and additional FDA Medication Guides as required.       DISEASE/MEDICATION-SPECIFIC INFORMATION        For patients on injectable medications: Patient currently has 0 doses left.  Next injection is scheduled for 12/25/22.    SPECIALTY MEDICATION ADHERENCE     Medication Adherence    Patient reported X missed doses in the last month: 0  Specialty Medication: ixekizumab (TALTZ AUTOINJECTOR) 80 mg/mL AtIn auto-injector  Patient is on additional specialty medications: No  Informant: patient              Were doses missed due to medication being on hold? No    TALTZ AUTOINJECTOR 80   mg/ml: 0 days of medicine on hand       REFERRAL TO PHARMACIST     Referral to the pharmacist: Not needed      [facility]     Shipping address confirmed in Epic.       Delivery Scheduled: Yes, Expected medication delivery date: 12/11/22.     Medication will be delivered via Same Day Courier to the prescription address in Epic WAM.    Lisa Carlson   [facility] Specialty Technician

## 2022-12-16 ENCOUNTER — Telehealth: Payer: Self-pay

## 2022-12-16 NOTE — Telephone Encounter (Signed)
Spoke with pt who verbalized understanding.

## 2022-12-20 NOTE — Telephone Encounter (Signed)
Patient will call around to find current Rx dose at a pharmacy that office will at that time send order to be filled.

## 2022-12-21 ENCOUNTER — Telehealth: Payer: Self-pay | Admitting: Family

## 2022-12-21 MED FILL — TALTZ AUTOINJECTOR 80 MG/ML SUBCUTANEOUS: SUBCUTANEOUS | 28 days supply | Qty: 1 | Fill #3

## 2022-12-21 NOTE — Telephone Encounter (Signed)
Patient stated that the pharmacy now has the [facility] in the 2.5 mg dose and will be filling the prescription for her. Patient was advised to pick up medication and verbalized understanding.

## 2022-12-21 NOTE — Telephone Encounter (Signed)
Patient left VM that there was some confusion about her [facility] Rx. I am unsure what the confusion is that she is talking about so we need to call her and find out.

## 2022-12-26 ENCOUNTER — Telehealth: Payer: Self-pay

## 2022-12-26 ENCOUNTER — Encounter: Payer: Self-pay | Admitting: Internal Medicine

## 2022-12-26 ENCOUNTER — Other Ambulatory Visit: Payer: Self-pay | Admitting: Internal Medicine

## 2022-12-26 ENCOUNTER — Ambulatory Visit (INDEPENDENT_AMBULATORY_CARE_PROVIDER_SITE_OTHER): Payer: Medicare HMO | Admitting: Internal Medicine

## 2022-12-26 VITALS — Ht 64.0 in

## 2022-12-26 DIAGNOSIS — R0989 Other specified symptoms and signs involving the circulatory and respiratory systems: Secondary | ICD-10-CM

## 2022-12-26 DIAGNOSIS — J40 Bronchitis, not specified as acute or chronic: Secondary | ICD-10-CM

## 2022-12-26 DIAGNOSIS — J069 Acute upper respiratory infection, unspecified: Secondary | ICD-10-CM | POA: Diagnosis not present

## 2022-12-26 DIAGNOSIS — R051 Acute cough: Secondary | ICD-10-CM

## 2022-12-26 DIAGNOSIS — R059 Cough, unspecified: Secondary | ICD-10-CM

## 2022-12-26 LAB — POCT XPERT XPRESS SARS COVID-2/FLU/RSV
FLU A: NEGATIVE
FLU B: NEGATIVE
RSV RNA, PCR: NEGATIVE
SARS Coronavirus 2: NEGATIVE

## 2022-12-26 MED ORDER — LEVOFLOXACIN 500 MG PO TABS
500.0000 mg | ORAL_TABLET | Freq: Every day | ORAL | 0 refills | Status: AC
Start: 2022-12-26 — End: 2023-01-02

## 2022-12-26 NOTE — Progress Notes (Signed)
Patient notified and verbalized understanding. 

## 2022-12-26 NOTE — Telephone Encounter (Signed)
Patient left voicemail requesting a Rx for symptoms of: cough,congestion, wheezing etc. Patient was contacted and advised to come for a nurse visit to be tested for COVID and verbalized understanding. Patient will be coming in for a nurse visit today 12/26/22.

## 2022-12-27 ENCOUNTER — Other Ambulatory Visit: Payer: Self-pay | Admitting: Internal Medicine

## 2022-12-27 DIAGNOSIS — J40 Bronchitis, not specified as acute or chronic: Secondary | ICD-10-CM

## 2022-12-27 MED ORDER — PROMETHAZINE HCL 12.5 MG PO TABS: 12.5000 mg | ORAL_TABLET | Freq: Three times a day (TID) | ORAL | 0 refills | Status: DC | PRN

## 2023-01-02 ENCOUNTER — Telehealth: Payer: Self-pay

## 2023-01-03 ENCOUNTER — Other Ambulatory Visit: Payer: Self-pay | Admitting: Family

## 2023-01-03 DIAGNOSIS — I4891 Unspecified atrial fibrillation: Secondary | ICD-10-CM

## 2023-01-03 NOTE — Telephone Encounter (Signed)
Symptoms addressed by PCP.

## 2023-01-03 NOTE — Telephone Encounter (Signed)
Completed by Katherine Mantle.

## 2023-01-11 IMAGING — MR MR LUMBAR SPINE W/O CM
5 series · 31 of 48 positions shown · non-contrast
Comparison: Lumbar spine MRI 04/01/2020

CLINICAL DATA: Low back pain and possible cauda equina syndrome

EXAM:
MRI LUMBAR SPINE WITHOUT CONTRAST
TECHNIQUE: Multiplanar, multisequence MR imaging of the lumbar spine was
performed. No intravenous contrast was administered.

[Series 5: T2 · sagittal · 4.0mm · 0.81mm/px · 6 of 17 slices shown (1 of 2)]
[im 1/17]
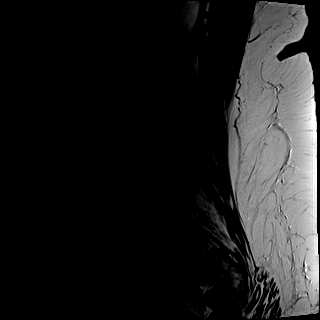
[im 4/17]
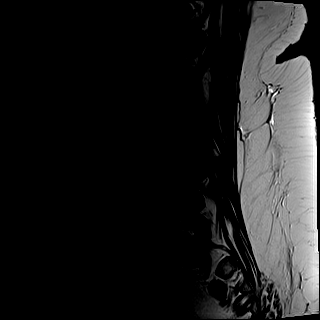
[im 7/17]
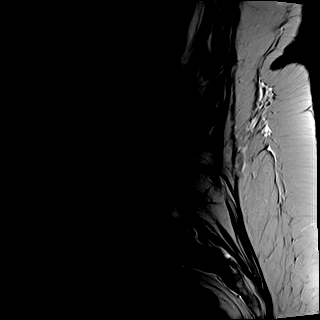
[im 10/17]
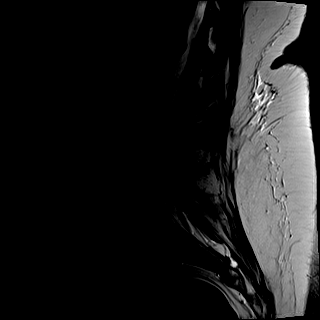
[im 13/17]
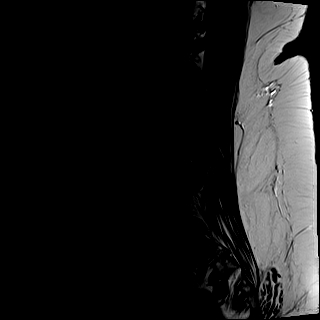
[im 17/17]
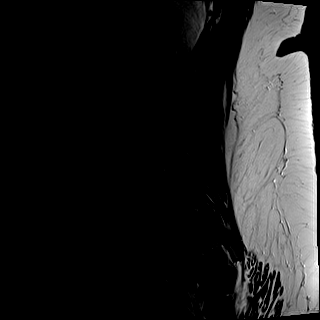

[Series 6: T1 · sagittal · 4.0mm · 0.81mm/px · 6 of 17 slices shown (1 of 2)]
[im 1/17]
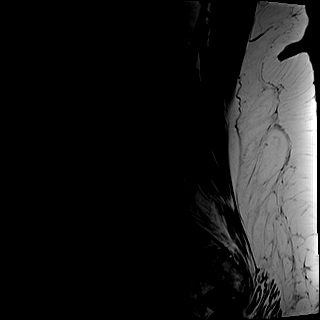
[im 4/17]
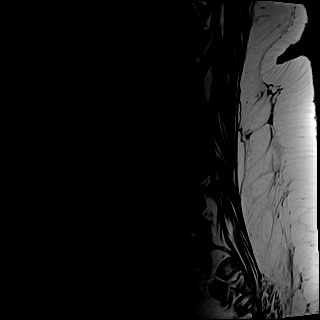
[im 7/17]
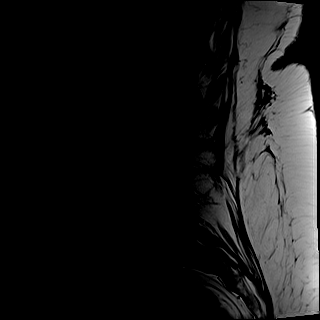
[im 10/17]
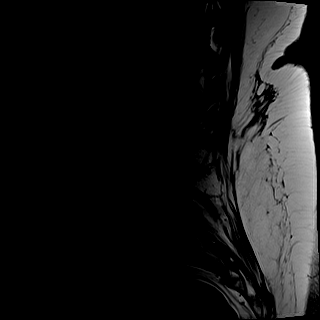
[im 13/17]
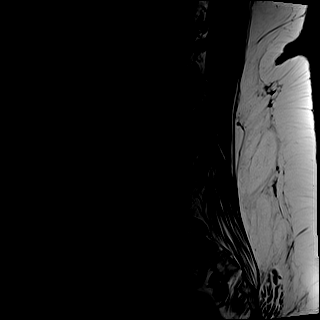
[im 17/17]
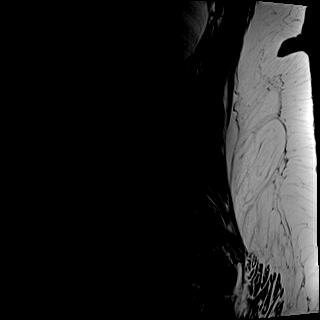

[Series 7: STIR · sagittal · 4.0mm · 0.41mm/px · 1 of 17 slices shown]
[im 1/17]
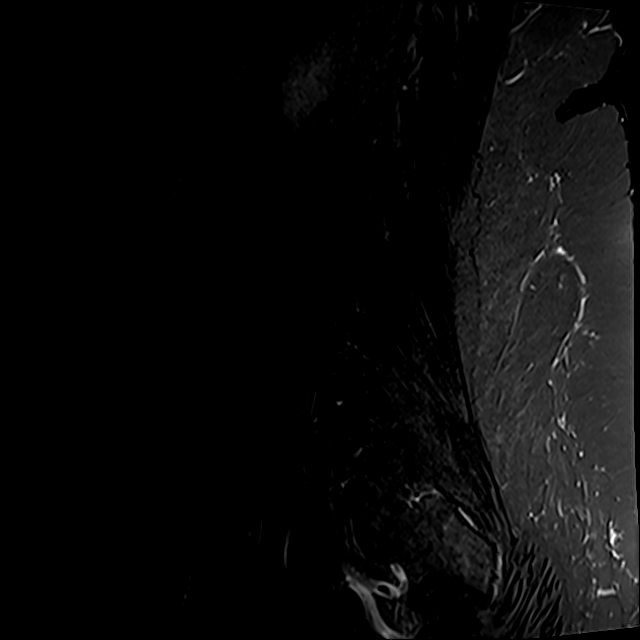

[Series 9: T1 · axial · 4.0mm · 0.39mm/px · z∈[-69,+175]mm · 9 of 39 slices shown (2 of 2)]
[im 1/39]
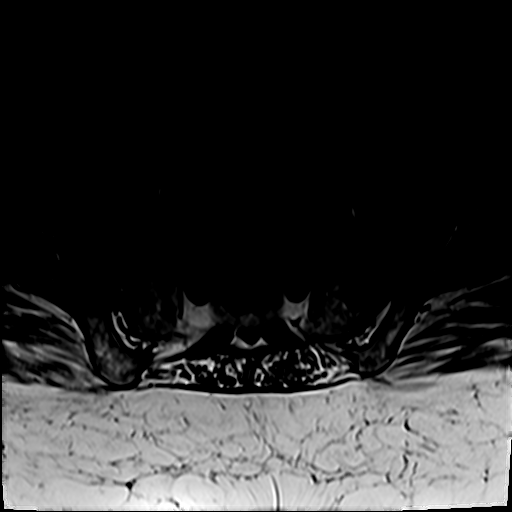
[im 6/39]
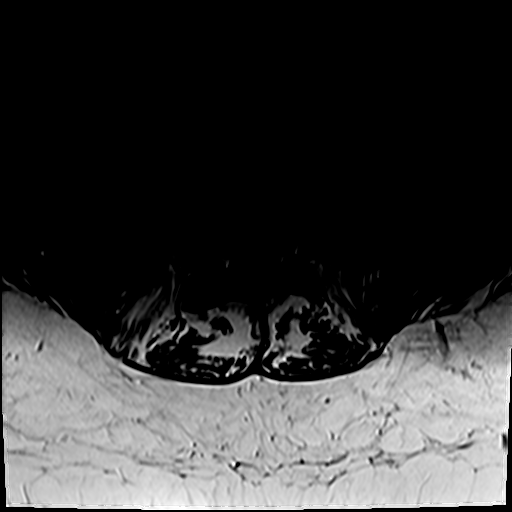
[im 11/39]
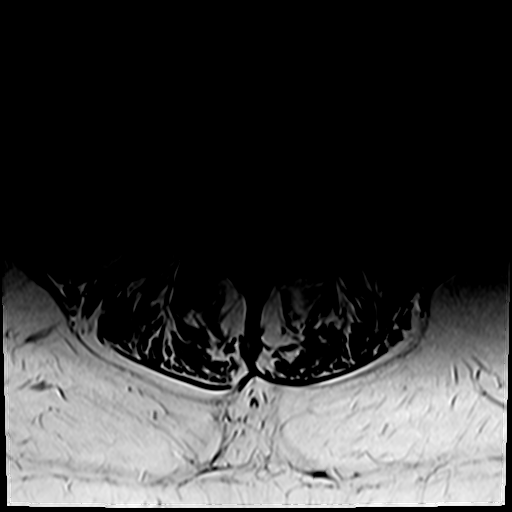
[im 17/39]
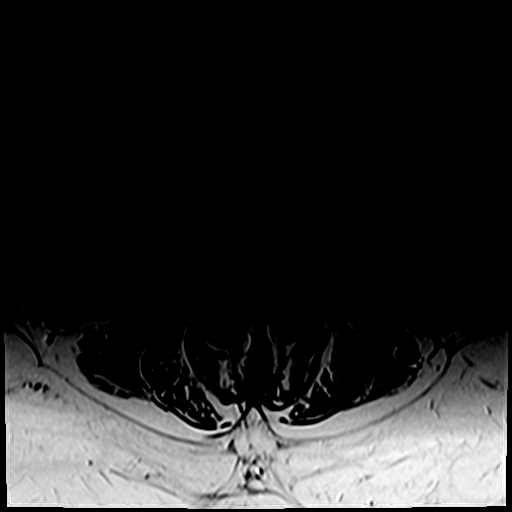
[im 20/39]
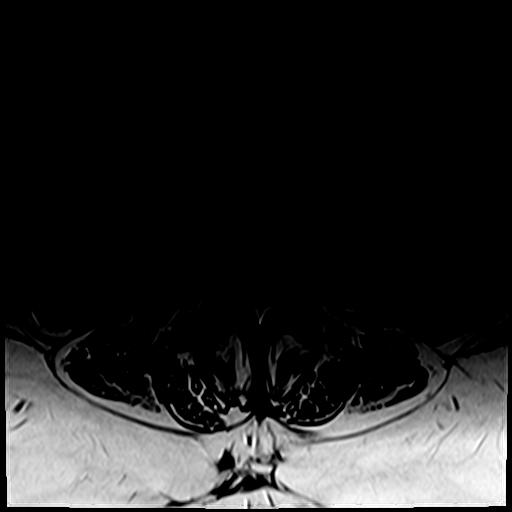
[im 22/39]
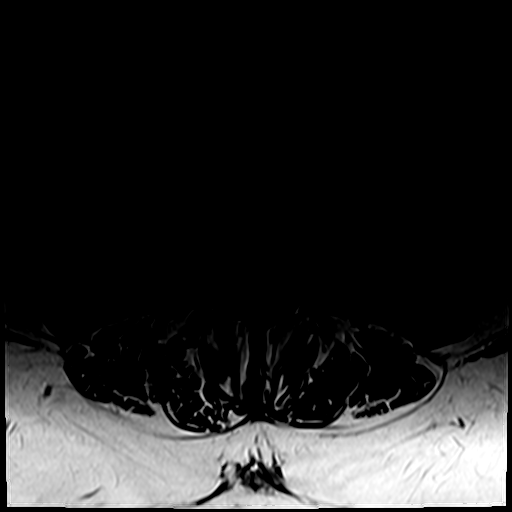
[im 28/39]
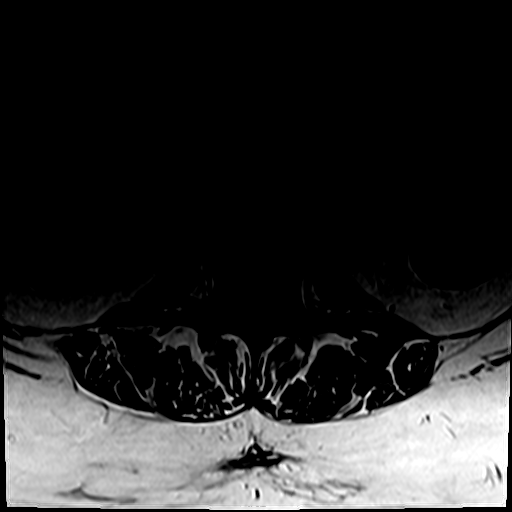
[im 33/39]
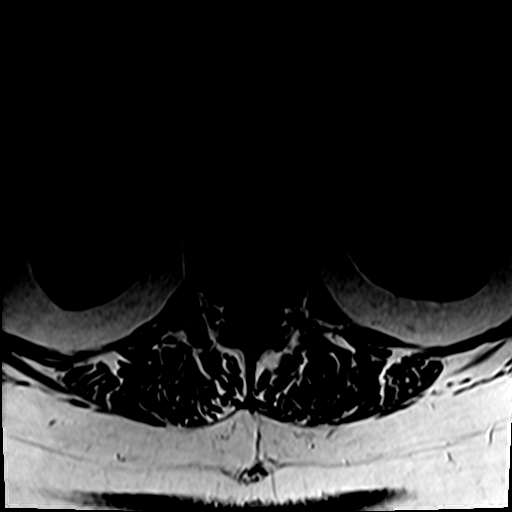
[im 39/39]
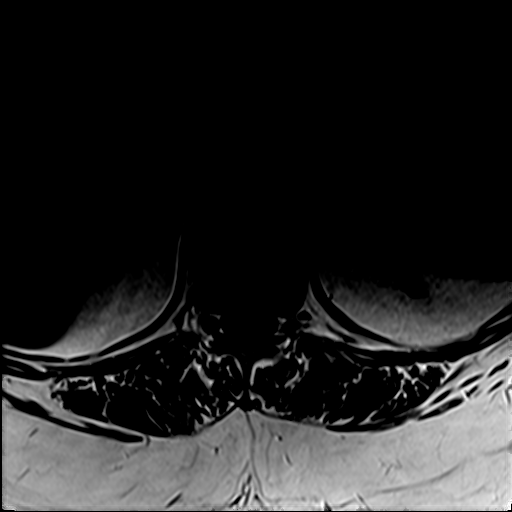

[Series 10: T2 · axial · 4.0mm · 0.78mm/px · z∈[-69,+175]mm · 9 of 39 slices shown (2 of 2)]
[im 1/39]
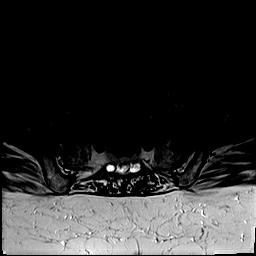
[im 6/39]
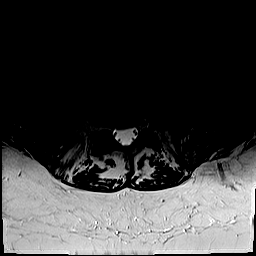
[im 11/39]
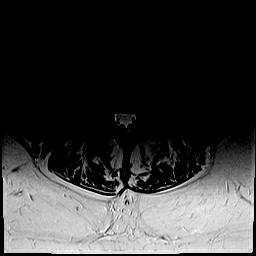
[im 17/39]
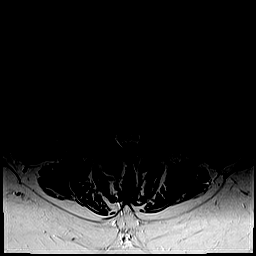
[im 20/39]
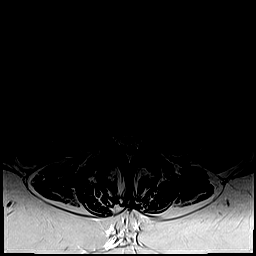
[im 22/39]
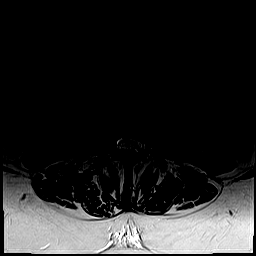
[im 28/39]
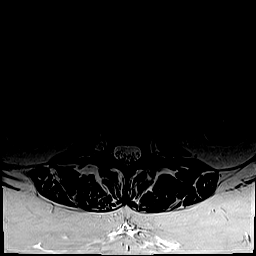
[im 33/39]
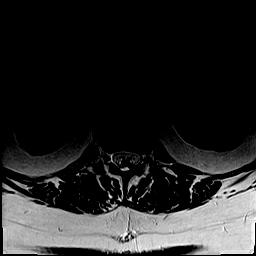
[im 39/39]
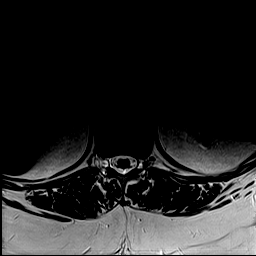

[31 of 48 positions shown; findings below may reference images not displayed]

FINDINGS: Segmentation:  Standard

Alignment:  Unchanged trace grade 1 retrolisthesis at L1-2 at L3-4.

Vertebrae: Modic type 1 endplate changes at L3-4. No fracture or
aggressive bone lesion.

Conus medullaris and cauda equina: Conus extends to the L1 level.
Conus and cauda equina appear normal.

Paraspinal and other soft tissues: Negative

Disc levels:

T12-L1: Normal disc space and facets. No spinal canal or
neuroforaminal stenosis.

L1-L2: Unchanged minimal disc bulge without stenosis.

L2-L3: Normal disc space and facets. No spinal canal or
neuroforaminal stenosis.

L3-L4: Mild facet hypertrophy with left asymmetric disc bulge and
superimposed left subarticular disc protrusion which has slightly
increased in size. There is narrowing of the left lateral recess
with mass effect on the left L4 nerve root. No central spinal canal
or neural foraminal stenosis.

L4-L5: Moderate right and mild left facet hypertrophy. Normal disc.
No stenosis.

L5-S1: Normal disc space and facets. No spinal canal or
neuroforaminal stenosis.

Visualized sacrum: Normal.
IMPRESSION: 1. Slight increase in size of left subarticular disc protrusion at
L3-L4 with narrowing of the left lateral recess and mass effect on
the left L4 nerve root.
2. No central spinal canal or neural foraminal stenosis.

## 2023-01-12 NOTE — Unmapped (Signed)
[facility] Refill Coordination Note    Specialty Medication(s) to be Shipped:   Inflammatory Disorders: Taltz    Other medication(s) to be shipped: No additional medications requested for fill at this time     Lisa Carlson, DOB: 1945/05/26  Phone: [phone] (home)       All above HIPAA information was verified with patient.     Was a Nurse, learning disability used for this call? No    Completed refill call assessment today to schedule patient's medication shipment from the [facility] [phone]).  All relevant notes have been reviewed.     Specialty medication(s) and dose(s) confirmed: Regimen is correct and unchanged.   Changes to medications: Lisa Carlson reports no changes at this time.  Changes to insurance: No  New side effects reported not previously addressed with a pharmacist or physician: None reported  Questions for the pharmacist: No    Confirmed patient received a Conservation officer, historic buildings and a Surveyor, mining with first shipment. The patient will receive a drug information handout for each medication shipped and additional FDA Medication Guides as required.       DISEASE/MEDICATION-SPECIFIC INFORMATION        For patients on injectable medications: Patient currently has 0 doses left.  Next injection is scheduled for 01/22/23.    SPECIALTY MEDICATION ADHERENCE     Medication Adherence    Specialty Medication: ixekizumab (TALTZ AUTOINJECTOR) 80 mg/mL AtIn auto-injector              Were doses missed due to medication being on hold? No    TALTZ AUTOINJECTOR 80   mg/ml: 0 days of medicine on hand       REFERRAL TO PHARMACIST     Referral to the pharmacist: Not needed      [facility]     Shipping address confirmed in Epic.       Delivery Scheduled: Yes, Expected medication delivery date: 01/20/23.     Medication will be delivered via Same Day Courier to the prescription address in Epic WAM.    [name]   [facility] Specialty Technician

## 2023-01-13 ENCOUNTER — Other Ambulatory Visit: Payer: Self-pay

## 2023-01-13 ENCOUNTER — Emergency Department: Payer: Medicare HMO

## 2023-01-13 ENCOUNTER — Encounter: Payer: Self-pay | Admitting: Emergency Medicine

## 2023-01-13 ENCOUNTER — Emergency Department
Admission: EM | Admit: 2023-01-13 | Discharge: 2023-01-13 | Disposition: A | Discharge status: Home / Self Care | Payer: Medicare HMO | Attending: Emergency Medicine | Admitting: Emergency Medicine

## 2023-01-13 DIAGNOSIS — M545 Low back pain, unspecified: Secondary | ICD-10-CM | POA: Diagnosis not present

## 2023-01-13 DIAGNOSIS — M549 Dorsalgia, unspecified: Secondary | ICD-10-CM | POA: Diagnosis not present

## 2023-01-13 DIAGNOSIS — M8580 Other specified disorders of bone density and structure, unspecified site: Secondary | ICD-10-CM | POA: Diagnosis not present

## 2023-01-13 DIAGNOSIS — J45909 Unspecified asthma, uncomplicated: Secondary | ICD-10-CM | POA: Diagnosis not present

## 2023-01-13 DIAGNOSIS — M47816 Spondylosis without myelopathy or radiculopathy, lumbar region: Secondary | ICD-10-CM | POA: Diagnosis not present

## 2023-01-13 DIAGNOSIS — I25112 Atherosclerotic heart disease of native coronary artery with refractory angina pectoris: Secondary | ICD-10-CM | POA: Diagnosis not present

## 2023-01-13 DIAGNOSIS — I4891 Unspecified atrial fibrillation: Secondary | ICD-10-CM | POA: Diagnosis not present

## 2023-01-13 DIAGNOSIS — E039 Hypothyroidism, unspecified: Secondary | ICD-10-CM | POA: Insufficient documentation

## 2023-01-13 DIAGNOSIS — M5442 Lumbago with sciatica, left side: Secondary | ICD-10-CM

## 2023-01-13 DIAGNOSIS — Z7901 Long term (current) use of anticoagulants: Secondary | ICD-10-CM | POA: Insufficient documentation

## 2023-01-13 DIAGNOSIS — I10 Essential (primary) hypertension: Secondary | ICD-10-CM | POA: Insufficient documentation

## 2023-01-13 DIAGNOSIS — J441 Chronic obstructive pulmonary disease with (acute) exacerbation: Secondary | ICD-10-CM | POA: Diagnosis not present

## 2023-01-13 DIAGNOSIS — Z951 Presence of aortocoronary bypass graft: Secondary | ICD-10-CM | POA: Insufficient documentation

## 2023-01-13 DIAGNOSIS — M419 Scoliosis, unspecified: Secondary | ICD-10-CM | POA: Diagnosis not present

## 2023-01-13 DIAGNOSIS — Z79899 Other long term (current) drug therapy: Secondary | ICD-10-CM | POA: Diagnosis not present

## 2023-01-13 DIAGNOSIS — S3992XA Unspecified injury of lower back, initial encounter: Secondary | ICD-10-CM | POA: Diagnosis present

## 2023-01-13 DIAGNOSIS — S39012A Strain of muscle, fascia and tendon of lower back, initial encounter: Secondary | ICD-10-CM | POA: Insufficient documentation

## 2023-01-13 DIAGNOSIS — W19XXXA Unspecified fall, initial encounter: Secondary | ICD-10-CM | POA: Diagnosis not present

## 2023-01-13 DIAGNOSIS — Z7951 Long term (current) use of inhaled steroids: Secondary | ICD-10-CM | POA: Diagnosis not present

## 2023-01-13 MED ORDER — DIAZEPAM 2 MG PO TABS
2.0000 mg | ORAL_TABLET | Freq: Once | ORAL | Status: AC
  Administered 2023-01-13: 2 mg via ORAL
  Filled 2023-01-13: qty 1

## 2023-01-13 MED ORDER — HYDROCODONE-ACETAMINOPHEN 5-325 MG PO TABS
1.0000 | ORAL_TABLET | Freq: Once | ORAL | Status: AC
  Administered 2023-01-13: 1 via ORAL
  Filled 2023-01-13: qty 1

## 2023-01-13 MED ORDER — HYDROCODONE-ACETAMINOPHEN 5-325 MG PO TABS: 1.0000 | ORAL_TABLET | Freq: Four times a day (QID) | ORAL | 0 refills | Status: DC | PRN

## 2023-01-13 MED ORDER — KETOROLAC TROMETHAMINE 60 MG/2ML IM SOLN
30.0000 mg | Freq: Once | INTRAMUSCULAR | Status: AC
  Administered 2023-01-13: 30 mg via INTRAMUSCULAR
  Filled 2023-01-13: qty 2

## 2023-01-13 MED ORDER — METHYLPREDNISOLONE 4 MG PO TBPK: ORAL_TABLET | ORAL | 0 refills | Status: DC

## 2023-01-13 MED ORDER — LIDOCAINE 5 % EX PTCH
1.0000 | MEDICATED_PATCH | Freq: Once | CUTANEOUS | Status: DC
  Administered 2023-01-13: 1 via TRANSDERMAL
  Filled 2023-01-13: qty 1

## 2023-01-13 MED ORDER — DIAZEPAM 2 MG PO TABS: 2.0000 mg | ORAL_TABLET | Freq: Three times a day (TID) | ORAL | 0 refills | Status: DC | PRN

## 2023-01-13 NOTE — ED Provider Notes (Signed)
[facility] Provider Note    Event Date/Time   First MD Initiated Contact with Patient 01/13/23 [phone]     (approximate)   History   Back Pain   HPI  Patricia Walker is a 77 y.o. female brought to the ED via EMS from home with a chief complaint of nontraumatic lower back pain.  Patient states she was trying to get out of bed to use the restroom, swung her legs around and experienced acute pain to her lower back radiating down her left buttock into her leg.  Denies extremity weakness.  Denies bowel or bladder incontinence.  Voices no other complaints or injuries.     Past Medical History   Past Medical History:  Diagnosis Date   Anxiety    Arthritis    Asthma    COPD exacerbation (HCC) 10/08/2020   Coronary artery disease    mild, nonobstructive   Depression    Dyspnea    on exertion   Dysrhythmia    Atrial Fibrillation   GERD (gastroesophageal reflux disease)    Heart murmur    Hyperlipidemia    Hypertension    Hypothyroidism    MI, old 2016   nonobstructive CAD by Cath   Morbid obesity (HCC)    Persistent atrial fibrillation (HCC)    Pneumonia 06/2018   and RSV   Psoriasis    Sleep apnea    compliant with CPAP   Thyroid disease      Active Problem List   Patient Active Problem List   Diagnosis Date Noted   Obstructive chronic bronchitis without exacerbation (HCC) 11/12/2022   Pneumonia 06/15/2018   Multifocal pneumonia 06/14/2018   BMI 45.0-49.9, adult (HCC) 06/04/2018   Iliotibial band friction syndrome of both knees 10/20/2017   Unstable angina (HCC) 06/13/2017   New onset of headaches 06/12/2017   Photophobia of right eye 06/12/2017   Degeneration of lumbar intervertebral disc 04/28/2017   Osteoarthritis of knee 11/17/2016   Coronary artery disease of native artery of native heart with stable angina pectoris (HCC) 09/16/2016   History of cardioversion 09/16/2016   Hyperlipidemia, unspecified 09/16/2016   Hypertension  09/16/2016   Obesity, unspecified 09/16/2016   Status post cardiac catheterization 09/16/2016   Atrial fibrillation (HCC) 09/08/2016   Herpes simplex 03/06/2016   Vitamin D deficiency 03/06/2016   Cardiac arrhythmia 09/09/2015   Gastroesophageal reflux disease 09/09/2015   Obstructive sleep apnea syndrome 09/09/2015   Psoriasis 09/09/2015     Past Surgical History   Past Surgical History:  Procedure Laterality Date   ARTERY BIOPSY Right 07/21/2017   Procedure: BIOPSY TEMPORAL ARTERY;  Surgeon: Renford Dills, MD;  Location: [facility] OR[facility];  Service: Vascular;  Laterality: Right;   CARDIAC CATHETERIZATION     CARDIOVERSION Right 09/01/2016   Procedure: Cardioversion;  Surgeon: Laurier Nancy, MD;  Location: [facility];  Service: Cardiovascular;  Laterality: Right;   CARDIOVERSION N/A 09/09/2016   Procedure: Cardioversion;  Surgeon: Laurier Nancy, MD;  Location: [facility];  Service: Cardiovascular;  Laterality: N/A;   CATARACT EXTRACTION W/PHACO Right 03/26/2019   Procedure: CATARACT EXTRACTION PHACO AND INTRAOCULAR LENS PLACEMENT (IOC) RIGHT 6.45, 00:39.9;  Surgeon: Galen Manila, MD;  Location: [facility];  Service: Ophthalmology;  Laterality: Right;   CATARACT EXTRACTION W/PHACO Left 04/16/2019   Procedure: CATARACT EXTRACTION PHACO AND INTRAOCULAR LENS PLACEMENT (IOC) LEFT;   3.14, 00:24.9;  Surgeon: Galen Manila, MD;  Location: [facility];  Service: Ophthalmology;  Laterality: Left;  sleep apnea-CPAP   COLONOSCOPY WITH PROPOFOL N/A 08/28/2019   Procedure: COLONOSCOPY WITH PROPOFOL;  Surgeon: Toledo, Boykin Nearing, MD;  Location: [facility];  Service: Gastroenterology;  Laterality: N/A;   ELECTROPHYSIOLOGIC STUDY N/A 02/01/2016   Procedure: CARDIOVERSION;  Surgeon: Laurier Nancy, MD;  Location: [facility];  Service: Cardiovascular;  Laterality: N/A;   ESOPHAGEAL DILATION     ESOPHAGOGASTRODUODENOSCOPY (EGD) WITH PROPOFOL N/A 02/06/2017   Procedure:  ESOPHAGOGASTRODUODENOSCOPY (EGD) WITH PROPOFOL;  Surgeon: Wyline Mood, MD;  Location[facility];  Service: Gastroenterology;  Laterality: N/A;   ESOPHAGOGASTRODUODENOSCOPY (EGD) WITH PROPOFOL N/A 08/28/2019   Procedure: ESOPHAGOGASTRODUODENOSCOPY (EGD) WITH PROPOFOL;  Surgeon: Toledo, Boykin Nearing, MD;  Location: [facility];  Service: Gastroenterology;  Laterality: N/A;   EUS N/A 02/16/2017   Procedure: FULL UPPER ENDOSCOPIC ULTRASOUND (EUS) RADIAL;  Surgeon: Doren Custard, MD;  Location: [facility] END[facility];  Service: Gastroenterology;  Laterality: N/A;   LEFT HEART CATH AND CORONARY ANGIOGRAPHY N/A 09/08/2016   Procedure: Left Heart Cath and Coronary Angiography;  Surgeon: Laurier Nancy, MD;  Location: [facility];  Service: Cardiovascular;  Laterality: N/A;   US ECHOCARDIOGRAPHY       Home Medications   Prior to Admission medications   Medication Sig Start Date End Date Taking? Authorizing Provider  diazepam (VALIUM) 2 MG tablet Take 1 tablet (2 mg total) by mouth every 8 (eight) hours as needed for muscle spasms. 01/13/23  Yes Irean Hong, MD  HYDROcodone-acetaminophen (NORCO) 5-325 MG tablet Take 1 tablet by mouth every 6 (six) hours as needed for moderate pain. 01/13/23  Yes Irean Hong, MD  methylPREDNISolone (MEDROL DOSEPAK) 4 MG TBPK tablet Take as directed 01/13/23  Yes Irean Hong, MD  albuterol (VENTOLIN HFA) 108 (90 Base) MCG/ACT inhaler Inhale 2 puffs into the lungs every 6 (six) hours as needed for wheezing or shortness of breath. 09/25/20   Cuthriell, Delorise Royals, PA-C  atorvastatin (LIPITOR) 40 MG tablet ONCE A DAY FOR CHOLESTEROL 11/23/22   Miki Kins, FNP  azelastine (ASTELIN) 0.1 % nasal spray Place 2 sprays into both nostrils 2 (two) times daily as needed for rhinitis or allergies.  10/13/16   [provider]  baclofen (LIORESAL) 10 MG tablet TAKE 1 TABLET BY MOUTH TWICE DAILY AS NEEDED FOR BACK SPASM. 11/24/22   Margaretann Loveless, MD   budesonide-formoterol [facility]) 160-4.5 MCG/ACT inhaler Inhale 2 puffs into the lungs 2 (two) times daily.    [provider]  ELIQUIS 5 MG TABS tablet TAKE 1 TABLET BY MOUTH TWICE A DAY 01/04/23   Miki Kins, FNP  esomeprazole (NEXIUM) 40 MG capsule TAKE 1 CAPSULE BY MOUTH EVERY DAY IN THE MORNING 11/23/22   Miki Kins, FNP  gabapentin (NEURONTIN) 300 MG capsule Take 300 mg by mouth 2 (two) times daily.    [provider]  isosorbide mononitrate (IMDUR) 30 MG 24 hr tablet TAKE 1 TABLET BY MOUTH EVERYDAY AT BEDTIME 10/11/22   Miki Kins, FNP  meloxicam (MOBIC) 15 MG tablet TAKE 1 TABLET BY MOUTH EVERY DAY 10/12/22   Miki Kins, FNP  montelukast (SINGULAIR) 10 MG tablet TAKE 1 TABLET BY MOUTH EVERY DAY 11/23/22   Miki Kins, FNP  promethazine (PHENERGAN) 12.5 MG tablet Take 1 tablet (12.5 mg total) by mouth every 8 (eight) hours as needed (for cough). 12/27/22   Margaretann Loveless, MD  TALTZ 80 MG/ML SOAJ Inject into the skin.    [provider]  tirzepatide [facility]) 2.5 MG/0.5ML Pen Inject 2.5 mg into the skin once a week. 12/15/22   Miki Kins, FNP  Vitamin D, Ergocalciferol, (DRISDOL) 1.25 MG (50000 UNIT) CAPS capsule Take 1 capsule (50,000 Units total) by mouth every 7 (seven) days. 11/08/22   Miki Kins, FNP     Allergies  Codeine and Other   Family History   Family History  Problem Relation Age of Onset   Breast cancer Paternal Aunt    Other Father    Heart attack Father      Physical Exam  Triage Vital Signs: ED Triage Vitals  Encounter Vitals Group     BP 01/13/23 0100 (!) 142/100     Systolic BP Percentile --      Diastolic BP Percentile --      Pulse Rate 01/13/23 0100 70     Resp 01/13/23 0100 18     Temp 01/13/23 0100 98.5 F (36.9 C)     Temp Source 01/13/23 0100 Oral     SpO2 01/13/23 0052 100 %     Weight 01/13/23 0100 300 lb (136.1 kg)     Height 01/13/23 0100 5\' 4"  (1.626 m)     Head  Circumference --      Peak Flow --      Pain Score 01/13/23 0100 10     Pain Loc --      Pain Education --      Exclude from Growth Chart --     Updated Vital Signs: BP (!) 142/100 (BP Location: Left Arm)   Pulse 70   Temp 98.5 F (36.9 C) (Oral)   Resp 18   Ht 5\' 4"  (1.626 m)   Wt 136.1 kg   SpO2 99%   BMI 51.49 kg/m    General: Awake, no distress.  CV:  Good peripheral perfusion.  Resp:  Normal effort.  Abd:  No distention.  Other:  No midline lumbar spine tenderness to palpation.  Left para lumbar spinal muscle spasms.  Tender to palpation left buttock. + SLR.  2+ distal pulses.  Brisk, less than 5-second capillary refill.   ED Results / Procedures / Treatments  Labs (all labs ordered are listed, but only abnormal results are displayed) Labs Reviewed - No data to display   EKG  None   RADIOLOGY I have independently visualized and interpreted patient's imaging study as well as noted the radiology interpretation:  Lumbar spine: No acute osseous abnormality, degenerative changes  Official radiology report(s): DG Lumbar Spine Complete  Result Date: 01/13/2023 CLINICAL DATA:  Back pain onset after getting out of bed tonight. EXAM: LUMBAR SPINE - COMPLETE 4+ VIEW COMPARISON:  CT abdomen and pelvis and reconstructions 10/05/2020 FINDINGS: There is a slight levorotary scoliosis with a AP listhesis. L5 again noted transitional with left hemisacralization. There is osteopenia without evidence of fractures. The vertebra are normal in heights. There is moderate spondylosis. Within normal limits in disc heights except for chronic L3-4 disc collapse, where the marginal osteophytes are more prominent than elsewhere. There are moderate facet osteophytes from L3-4 down, greatest at L4-5. The SI joints unremarkable, as visualized. There is abdominal aortic atherosclerosis. Comparison to the prior study reveals no significant interval change IMPRESSION: 1. No evidence of lumbar spine  fracture or traumatic subluxation. 2. Osteopenia, degenerative changes and scoliosis. 3. Abdominal aortic atherosclerosis. 4. No significant change from prior CT. Electronically Signed   By: Almira Bar M.D.   On: 01/13/2023 04:13  PROCEDURES:  Critical Care performed: No  Procedures   MEDICATIONS ORDERED IN ED: Medications  lidocaine (LIDODERM) 5 % 1 patch (has no administration in time range)  HYDROcodone-acetaminophen (NORCO/VICODIN) 5-325 MG per tablet 1 tablet (has no administration in time range)  ketorolac (TORADOL) injection 30 mg (30 mg Intramuscular Given 01/13/23 0412)  diazepam (VALIUM) tablet 2 mg (2 mg Oral Given 01/13/23 0412)     IMPRESSION / MDM / ASSESSMENT AND PLAN / ED COURSE  I reviewed the triage vital signs and the nursing notes.                             77 year old female presenting with nontraumatic low back pain/lumbar strain.  No focal neurological deficits on examination.  Will obtain x-rays, administer IM ketorolac, oral Valium for muscle relaxation and reassess.  Patient's presentation is most consistent with acute, uncomplicated illness.  0865 Updated patient of x-ray imaging results.  Will start steroid taper, lidocaine patch, hydrocodone which patient has taken before without adverse effect.  Strict return precautions given.  Patient verbalizes understanding and agrees with plan of care.  [id number] No walkers available; patient will stay in the ED until morning when we can reach supply company for walker.  FINAL CLINICAL IMPRESSION(S) / ED DIAGNOSES   Final diagnoses:  Strain of lumbar region, initial encounter  Acute left-sided low back pain with left-sided sciatica     Rx / DC Orders   ED Discharge Orders          Ordered    methylPREDNISolone (MEDROL DOSEPAK) 4 MG TBPK tablet        01/13/23 0453    HYDROcodone-acetaminophen (NORCO) 5-325 MG tablet  Every 6 hours PRN        01/13/23 0453    diazepam (VALIUM) 2 MG tablet  Every 8  hours PRN        01/13/23 0453             Note:  This document was prepared using Dragon voice recognition software and [place]berger include unintentional dictation errors.   Irean Hong, MD 01/13/23 [phone]

## 2023-01-13 NOTE — ED Notes (Signed)
Walker supplied to pt. D/c instructions and prescriptions reviewed with pt. Pt will call daughter for ride home.

## 2023-01-13 NOTE — ED Notes (Signed)
Pt ambulatory short distances with some stand by assistance.

## 2023-01-13 NOTE — Discharge Instructions (Signed)
Start steroid taper as prescribed. You Twining take medicines as needed for pain & muscle spasms (Norco/Valium #15). Use walker to help you balance as you walk. Apply Lidocaine patch as directed. Return to the ER for worsening symptoms, persistent vomiting, difficulty breathing or other concerns.

## 2023-01-13 NOTE — ED Triage Notes (Signed)
EMS brings pt in from home for c/o lower back pain that began after getting OOB; denies any specific injury or falls

## 2023-01-13 NOTE — ED Notes (Signed)
Unable to find walker for pt a this time. MD made aware, will see how we can best assist pt.

## 2023-01-16 ENCOUNTER — Other Ambulatory Visit: Payer: Self-pay | Admitting: Family

## 2023-01-20 MED FILL — TALTZ AUTOINJECTOR 80 MG/ML SUBCUTANEOUS: SUBCUTANEOUS | 28 days supply | Qty: 1 | Fill #4

## 2023-01-31 ENCOUNTER — Ambulatory Visit: Payer: Medicare HMO | Admitting: Family

## 2023-01-31 ENCOUNTER — Encounter: Payer: Self-pay | Admitting: Family

## 2023-01-31 VITALS — BP 130/75 | HR 84 | Ht 64.0 in | Wt 298.0 lb

## 2023-01-31 DIAGNOSIS — G8929 Other chronic pain: Secondary | ICD-10-CM

## 2023-01-31 DIAGNOSIS — M545 Low back pain, unspecified: Secondary | ICD-10-CM

## 2023-01-31 DIAGNOSIS — I4891 Unspecified atrial fibrillation: Secondary | ICD-10-CM

## 2023-01-31 LAB — POCT URINALYSIS DIPSTICK
Bilirubin, UA: NEGATIVE
Blood, UA: NEGATIVE
Glucose, UA: NEGATIVE
Ketones, UA: NEGATIVE
Leukocytes, UA: NEGATIVE
Nitrite, UA: NEGATIVE
Protein, UA: NEGATIVE
Spec Grav, UA: >=1.03 — AB (ref 1.010–1.025)
Urobilinogen, UA: 0.2 U/dL
pH, UA: 6 (ref 5.0–8.0)

## 2023-01-31 MED ORDER — METOPROLOL SUCCINATE ER 25 MG PO TB24: 25.0000 mg | ORAL_TABLET | Freq: Every day | ORAL | 2 refills | Status: DC

## 2023-01-31 MED ORDER — GEMTESA 75 MG PO TABS: 75.0000 mg | ORAL_TABLET | Freq: Every day | ORAL | 5 refills | Status: DC

## 2023-01-31 NOTE — Progress Notes (Signed)
Acute Office Visit  Subjective:     Patient ID: Patricia Walker, female    DOB: 05-30-1945, 77 y.o.   MRN: [id number]  Patient is in today for  Chief Complaint  Patient presents with   Follow-up    Back pain & trouble walking    Back Pain This is a recurrent problem. The current episode started more than 1 year ago. The problem occurs intermittently. The problem has been waxing and waning since onset. The pain is present in the lumbar spine. The quality of the pain is described as shooting and stabbing. The pain radiates to the left thigh and right thigh. The pain is severe. The pain is The same all the time. The symptoms are aggravated by standing, position, twisting and bending. Stiffness is present All day. Associated symptoms include chest pain and weakness. Risk factors include obesity, history of steroid use, sedentary lifestyle, menopause and lack of exercise. She has tried analgesics, bed rest, home exercises, heat, ice, muscle relaxant, NSAIDs and walking for the symptoms. The treatment provided mild relief.  Palpitations  This is a recurrent problem. The current episode started more than 1 year ago. The problem occurs intermittently. The problem has been waxing and waning. Nothing aggravates the symptoms. Associated symptoms include chest pain, dizziness, near-syncope and weakness. She has tried bed rest, breathing exercises and deep relaxation for the symptoms. The treatment provided no relief. Risk factors include diabetes mellitus, dyslipidemia, obesity, stress and post menopause. Her past medical history is significant for anxiety and heart disease.     Review of Systems  Cardiovascular:  Positive for chest pain, palpitations and near-syncope.  Musculoskeletal:  Positive for back pain.  Neurological:  Positive for dizziness and weakness.        Objective:    BP 130/75   Pulse 84   Ht 5\' 4"  (1.626 m)   Wt 298 lb (135.2 kg)   SpO2 96%   BMI 51.15 kg/m   Physical  Exam Vitals and nursing note reviewed.  Constitutional:      Appearance: Normal appearance. She is normal weight.  HENT:     Head: Normocephalic.  Eyes:     Extraocular Movements: Extraocular movements intact.     Conjunctiva/sclera: Conjunctivae normal.     Pupils: Pupils are equal, round, and reactive to light.  Cardiovascular:     Rate and Rhythm: Rhythm irregular.  Pulmonary:     Effort: Pulmonary effort is normal.  Neurological:     General: No focal deficit present.     Mental Status: She is alert and oriented to person, place, and time. Mental status is at baseline.  Psychiatric:        Mood and Affect: Mood normal.        Behavior: Behavior normal.        Thought Content: Thought content normal.        Judgment: Judgment normal.     Results for orders placed or performed in visit on 01/31/23  POCT urinalysis dipstick  Result Value Ref Range   Color, UA     Clarity, UA     Glucose, UA Negative Negative   Bilirubin, UA Negative    Ketones, UA Negative    Spec Grav, UA >=1.030 (A) 1.010 - 1.025   Blood, UA Negative    pH, UA 6.0 5.0 - 8.0   Protein, UA Negative Negative   Urobilinogen, UA 0.2 0.2 or 1.0 E.U./dL   Nitrite, UA Negative  Leukocytes, UA Negative Negative   Appearance     Odor      Recent Results (from the past 2160 hours)  POCT urinalysis dipstick     Status: Abnormal   Collection Time: 01/31/23  1:39 PM  Result Value Ref Range   Color, UA     Clarity, UA     Glucose, UA Negative Negative   Bilirubin, UA Negative    Ketones, UA Negative    Spec Grav, UA >=1.030 (A) 1.010 - 1.025   Blood, UA Negative    pH, UA 6.0 5.0 - 8.0   Protein, UA Negative Negative   Urobilinogen, UA 0.2 0.2 or 1.0 E.U./dL   Nitrite, UA Negative    Leukocytes, UA Negative Negative   Appearance     Odor    CBC with Differential     Status: Abnormal   Collection Time: 02/02/23  6:34 PM  Result Value Ref Range   WBC 8.5 4.0 - 10.5 K/uL   RBC 5.40 (H) 3.87 - 5.11  MIL/uL   Hemoglobin 14.7 12.0 - 15.0 g/dL   HCT 16.1 09.6 - 04.5 %   MCV 84.4 80.0 - 100.0 fL   MCH 27.2 26.0 - 34.0 pg   MCHC 32.2 30.0 - 36.0 g/dL   RDW 40.9 81.1 - 91.4 %   Platelets 350 150 - 400 K/uL   nRBC 0.0 0.0 - 0.2 %   Neutrophils Relative % 62 %   Neutro Abs 5.3 1.7 - 7.7 K/uL   Lymphocytes Relative 22 %   Lymphs Abs 1.9 0.7 - 4.0 K/uL   Monocytes Relative 10 %   Monocytes Absolute 0.8 0.1 - 1.0 K/uL   Eosinophils Relative 4 %   Eosinophils Absolute 0.4 0.0 - 0.5 K/uL   Basophils Relative 1 %   Basophils Absolute 0.1 0.0 - 0.1 K/uL   Immature Granulocytes 1 %   Abs Immature Granulocytes 0.04 0.00 - 0.07 K/uL    Comment: Performed at [facility], [address] [place], [place] [address]  Comprehensive metabolic panel     Status: Abnormal   Collection Time: 02/02/23  6:34 PM  Result Value Ref Range   Sodium 135 135 - 145 mmol/L   Potassium 4.2 3.5 - 5.1 mmol/L   Chloride 101 98 - 111 mmol/L   CO2 24 22 - 32 mmol/L   Glucose, Bld 111 (H) 70 - 99 mg/dL    Comment: Glucose reference range applies only to samples taken after fasting for at least 8 hours.   BUN 24 (H) 8 - 23 mg/dL   Creatinine, Ser 6.21 0.44 - 1.00 mg/dL   Calcium 8.8 (L) 8.9 - 10.3 mg/dL   Total Protein 7.1 6.5 - 8.1 g/dL   Albumin 3.8 3.5 - 5.0 g/dL   AST 20 15 - 41 U/L   ALT 17 0 - 44 U/L   Alkaline Phosphatase 77 38 - 126 U/L   Total Bilirubin 0.8 0.3 - 1.2 mg/dL   GFR, Estimated 58 (L) >60 mL/min    Comment: (NOTE) Calculated using the CKD-EPI Creatinine Equation (2021)    Anion gap 10 5 - 15    Comment: Performed at [facility], [address] [place], [place] [address]  Troponin I (High Sensitivity)     Status: None   Collection Time: 02/02/23  6:34 PM  Result Value Ref Range   Troponin I (High Sensitivity) 12 <18 ng/L    Comment: (NOTE) Elevated high sensitivity troponin  I (hsTnI) values and significant  changes across serial measurements Pfeffer suggest ACS but  many other  chronic and acute conditions are known to elevate hsTnI results.  Refer to the "Links" section for chest pain algorithms and additional  guidance. Performed at [facility], [address] [place], [place] [address]   Troponin I (High Sensitivity)     Status: None   Collection Time: 02/02/23  9:13 PM  Result Value Ref Range   Troponin I (High Sensitivity) 12 <18 ng/L    Comment: (NOTE) Elevated high sensitivity troponin I (hsTnI) values and significant  changes across serial measurements Heid suggest ACS but many other  chronic and acute conditions are known to elevate hsTnI results.  Refer to the "Links" section for chest pain algorithms and additional  guidance. Performed at [facility], [address] [place], [place] [address]   POCT XPERT XPRESS SARS COVID-2/FLU/RSV [[id number]]     Status: Normal   Collection Time: 02/23/23  4:30 PM  Result Value Ref Range   SARS Coronavirus 2 negative    FLU A negative    FLU B negative    RSV RNA, PCR negative     Allergies as of 01/31/2023       Reactions   Codeine Hives, Itching, Rash   Other Itching, Other (See Comments)   States antibiotic in the past caused itching but can not remember name        Medication List        Accurate as of January 31, 2023 11:59 PM. If you have any questions, ask your nurse or doctor.          albuterol 108 (90 Base) MCG/ACT inhaler Commonly known as: VENTOLIN HFA INHALE 1-2 PUFFS EVERY 4-6 HOURS AS NEEDED FOR SHORTNESS OF BREATH   atorvastatin 40 MG tablet Commonly known as: LIPITOR ONCE A DAY FOR CHOLESTEROL   azelastine 0.1 % nasal spray Commonly known as: ASTELIN Place 2 sprays into both nostrils 2 (two) times daily as needed for rhinitis or allergies.   baclofen 10 MG tablet Commonly known as: LIORESAL TAKE 1 TABLET BY MOUTH TWICE DAILY AS NEEDED FOR BACK SPASM.   budesonide-formoterol 160-4.5 MCG/ACT inhaler Commonly known as:  SYMBICORT Inhale 2 puffs into the lungs 2 (two) times daily.   diazepam 2 MG tablet Commonly known as: Valium Take 1 tablet (2 mg total) by mouth every 8 (eight) hours as needed for muscle spasms.   Eliquis 5 MG Tabs tablet Generic drug: apixaban TAKE 1 TABLET BY MOUTH TWICE A DAY   esomeprazole 40 MG capsule Commonly known as: NEXIUM TAKE 1 CAPSULE BY MOUTH EVERY DAY IN THE MORNING   gabapentin 300 MG capsule Commonly known as: NEURONTIN Take 300 mg by mouth 2 (two) times daily.   Gemtesa 75 MG Tabs Generic drug: Vibegron Take 1 tablet (75 mg total) by mouth daily. Started by: Miki Kins   HYDROcodone-acetaminophen 5-325 MG tablet Commonly known as: Norco Take 1 tablet by mouth every 6 (six) hours as needed for moderate pain.   isosorbide mononitrate 30 MG 24 hr tablet Commonly known as: IMDUR TAKE 1 TABLET BY MOUTH EVERYDAY AT BEDTIME   meloxicam 15 MG tablet Commonly known as: MOBIC TAKE 1 TABLET BY MOUTH EVERY DAY   methylPREDNISolone 4 MG Tbpk tablet Commonly known as: MEDROL DOSEPAK Take as directed   metoprolol succinate 25 MG 24 hr tablet Commonly known as: Toprol XL Take 1 tablet (25 mg total) by  mouth daily. Started by: Miki Kins   montelukast 10 MG tablet Commonly known as: SINGULAIR TAKE 1 TABLET BY MOUTH EVERY DAY   Mounjaro 2.5 MG/0.5ML Pen Generic drug: tirzepatide Inject 2.5 mg into the skin once a week.   promethazine 12.5 MG tablet Commonly known as: PHENERGAN Take 1 tablet (12.5 mg total) by mouth every 8 (eight) hours as needed (for cough).   Taltz 80 MG/ML pen Generic drug: ixekizumab Inject into the skin.   Vitamin D (Ergocalciferol) 1.25 MG (50000 UNIT) Caps capsule Commonly known as: DRISDOL Take 1 capsule (50,000 Units total) by mouth every 7 (seven) days.            Assessment & Plan:   Problem List Items Addressed This Visit       Active Problems   Atrial fibrillation (HCC)   EKG In office today  clearly different from previous.  Discussed with Cardiology - will send metoprolol and have patient setup appt with Cardio this week.       Relevant Medications   metoprolol succinate (TOPROL XL) 25 MG 24 hr tablet   Other Relevant Orders   EKG 12-Lead   Chronic bilateral low back pain without sciatica - Primary   Sending referral to neurosurgery.  Will defer to them for further treatments, as we have tried multiple conservative measures without any improvements.   Reassess at follow up.       Relevant Orders   POCT urinalysis dipstick (Completed)   Ambulatory referral to Neurosurgery     Return in about 1 month (around 03/03/2023).  Total time spent: 30 minutes  Miki Kins, FNP  01/31/2023   This document Gangl have been prepared by [facility] Voice Recognition software and as such Zahner include unintentional dictation errors.

## 2023-02-02 ENCOUNTER — Emergency Department
Admission: EM | Admit: 2023-02-02 | Discharge: 2023-02-02 | Disposition: A | Discharge status: Home / Self Care | Payer: Medicare HMO | Attending: Student in an Organized Health Care Education/Training Program | Admitting: Student in an Organized Health Care Education/Training Program

## 2023-02-02 ENCOUNTER — Emergency Department: Payer: Medicare HMO

## 2023-02-02 ENCOUNTER — Other Ambulatory Visit: Payer: Self-pay

## 2023-02-02 ENCOUNTER — Encounter: Payer: Self-pay | Admitting: *Deleted

## 2023-02-02 DIAGNOSIS — I4891 Unspecified atrial fibrillation: Secondary | ICD-10-CM | POA: Insufficient documentation

## 2023-02-02 DIAGNOSIS — R0602 Shortness of breath: Secondary | ICD-10-CM | POA: Insufficient documentation

## 2023-02-02 DIAGNOSIS — I2510 Atherosclerotic heart disease of native coronary artery without angina pectoris: Secondary | ICD-10-CM | POA: Diagnosis not present

## 2023-02-02 DIAGNOSIS — R0789 Other chest pain: Secondary | ICD-10-CM | POA: Diagnosis not present

## 2023-02-02 DIAGNOSIS — Z7901 Long term (current) use of anticoagulants: Secondary | ICD-10-CM | POA: Diagnosis not present

## 2023-02-02 DIAGNOSIS — M50322 Other cervical disc degeneration at C5-C6 level: Secondary | ICD-10-CM | POA: Diagnosis not present

## 2023-02-02 DIAGNOSIS — M1712 Unilateral primary osteoarthritis, left knee: Secondary | ICD-10-CM | POA: Diagnosis not present

## 2023-02-02 DIAGNOSIS — R519 Headache, unspecified: Secondary | ICD-10-CM | POA: Diagnosis not present

## 2023-02-02 DIAGNOSIS — S0990XA Unspecified injury of head, initial encounter: Secondary | ICD-10-CM | POA: Diagnosis not present

## 2023-02-02 DIAGNOSIS — M25552 Pain in left hip: Secondary | ICD-10-CM | POA: Diagnosis not present

## 2023-02-02 DIAGNOSIS — J40 Bronchitis, not specified as acute or chronic: Secondary | ICD-10-CM | POA: Diagnosis not present

## 2023-02-02 DIAGNOSIS — S199XXA Unspecified injury of neck, initial encounter: Secondary | ICD-10-CM | POA: Diagnosis not present

## 2023-02-02 DIAGNOSIS — M4802 Spinal stenosis, cervical region: Secondary | ICD-10-CM | POA: Diagnosis not present

## 2023-02-02 DIAGNOSIS — R079 Chest pain, unspecified: Secondary | ICD-10-CM | POA: Diagnosis present

## 2023-02-02 LAB — CBC WITH DIFFERENTIAL/PLATELET
Abs Immature Granulocytes: 0.04 10*3/uL (ref 0.00–0.07)
Basophils Absolute: 0.1 10*3/uL (ref 0.0–0.1)
Basophils Relative: 1 %
Eosinophils Absolute: 0.4 10*3/uL (ref 0.0–0.5)
Eosinophils Relative: 4 %
HCT: 45.6 % (ref 36.0–46.0)
Hemoglobin: 14.7 g/dL (ref 12.0–15.0)
Immature Granulocytes: 1 %
Lymphocytes Relative: 22 %
Lymphs Abs: 1.9 10*3/uL (ref 0.7–4.0)
MCH: 27.2 pg (ref 26.0–34.0)
MCHC: 32.2 g/dL (ref 30.0–36.0)
MCV: 84.4 fL (ref 80.0–100.0)
Monocytes Absolute: 0.8 10*3/uL (ref 0.1–1.0)
Monocytes Relative: 10 %
Neutro Abs: 5.3 10*3/uL (ref 1.7–7.7)
Neutrophils Relative %: 62 %
Platelets: 350 10*3/uL (ref 150–400)
RBC: 5.4 MIL/uL — ABNORMAL HIGH (ref 3.87–5.11)
RDW: 13.3 % (ref 11.5–15.5)
WBC: 8.5 10*3/uL (ref 4.0–10.5)
nRBC: 0 % (ref 0.0–0.2)

## 2023-02-02 LAB — COMPREHENSIVE METABOLIC PANEL
ALT: 17 U/L (ref 0–44)
AST: 20 U/L (ref 15–41)
Albumin: 3.8 g/dL (ref 3.5–5.0)
Alkaline Phosphatase: 77 U/L (ref 38–126)
Anion gap: 10 (ref 5–15)
BUN: 24 mg/dL — ABNORMAL HIGH (ref 8–23)
CO2: 24 mmol/L (ref 22–32)
Calcium: 8.8 mg/dL — ABNORMAL LOW (ref 8.9–10.3)
Chloride: 101 mmol/L (ref 98–111)
Creatinine, Ser: 1 mg/dL (ref 0.44–1.00)
GFR, Estimated: 58 mL/min — ABNORMAL LOW (ref >60)
Glucose, Bld: 111 mg/dL — ABNORMAL HIGH (ref 70–99)
Potassium: 4.2 mmol/L (ref 3.5–5.1)
Sodium: 135 mmol/L (ref 135–145)
Total Bilirubin: 0.8 mg/dL (ref 0.3–1.2)
Total Protein: 7.1 g/dL (ref 6.5–8.1)

## 2023-02-02 LAB — TROPONIN I (HIGH SENSITIVITY)
Troponin I (High Sensitivity): 12 ng/L (ref <18)
Troponin I (High Sensitivity): 12 ng/L (ref <18)

## 2023-02-02 MED ORDER — PREDNISONE 10 MG PO TABS: 10.0000 mg | ORAL_TABLET | Freq: Every day | ORAL | 0 refills | Status: DC

## 2023-02-02 MED ORDER — DOXYCYCLINE HYCLATE 100 MG PO TABS: 100.0000 mg | ORAL_TABLET | Freq: Two times a day (BID) | ORAL | 0 refills | Status: AC

## 2023-02-02 MED ORDER — IPRATROPIUM-ALBUTEROL 0.5-2.5 (3) MG/3ML IN SOLN
3.0000 mL | Freq: Once | RESPIRATORY_TRACT | Status: AC
  Administered 2023-02-02: 3 mL via RESPIRATORY_TRACT
  Filled 2023-02-02: qty 3

## 2023-02-02 MED ORDER — PREDNISONE 20 MG PO TABS
40.0000 mg | ORAL_TABLET | Freq: Once | ORAL | Status: AC
  Administered 2023-02-02: 40 mg via ORAL
  Filled 2023-02-02: qty 2
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
## 2023-02-02 NOTE — ED Notes (Signed)
 Provided pt with discharge instructions and education. All of pt questions answered. Pt in possession of all belongings. Pt AAOX4 and stable at time of discharge.Pt ambulated w/ steady gait towards ED exit.

## 2023-02-03 ENCOUNTER — Ambulatory Visit (INDEPENDENT_AMBULATORY_CARE_PROVIDER_SITE_OTHER): Payer: Medicare HMO | Admitting: Cardiology

## 2023-02-03 ENCOUNTER — Encounter: Payer: Self-pay | Admitting: Cardiology

## 2023-02-03 VITALS — BP 138/72 | HR 73 | Wt 293.8 lb

## 2023-02-03 DIAGNOSIS — Z0130 Encounter for examination of blood pressure without abnormal findings: Secondary | ICD-10-CM

## 2023-02-03 DIAGNOSIS — I4891 Unspecified atrial fibrillation: Secondary | ICD-10-CM

## 2023-02-03 NOTE — Assessment & Plan Note (Signed)
Patient in atrial fibrillation on exam today, rate controlled. Will order a stress test and echo. Continue current medications.

## 2023-02-03 NOTE — Progress Notes (Signed)
ROS:     Review of Systems  Constitutional: Negative.   HENT: Negative.    Eyes: Negative.   Respiratory:  Positive for cough and sputum production. Negative for shortness of breath.   Cardiovascular:  Positive for chest pain.  Gastrointestinal: Negative.  Negative for abdominal pain, constipation and diarrhea.  Genitourinary: Negative.   Musculoskeletal:  Negative for joint pain and myalgias.  Skin: Negative.   Neurological: Negative.  Negative for dizziness and headaches.  Endo/Heme/Allergies: Negative.   All other systems reviewed and are negative.     All other systems are reviewed and negative.    PHYSICAL EXAM: VS:  BP 138/72   Pulse 73   Wt 293 lb 12.8 oz (133.3 kg)   SpO2 95%   BMI 50.43 kg/m  , BMI Body mass index is 50.43 kg/m. Last weight:  Wt Readings from Last 3 Encounters:  02/03/23 293 lb 12.8 oz (133.3 kg)  02/02/23 298 lb (135.2 kg)  01/31/23 298 lb (135.2 kg)     Physical Exam Vitals and nursing note reviewed.  Constitutional:      Appearance: Normal appearance. She is normal weight.  HENT:     Head: Normocephalic and atraumatic.     Nose: Nose normal.     Mouth/Throat:     Mouth: Mucous membranes are moist.     Pharynx: Oropharynx is clear.  Eyes:      Conjunctiva/sclera: Conjunctivae normal.     Pupils: Pupils are equal, round, and reactive to light.  Cardiovascular:     Rate and Rhythm: Normal rate and regular rhythm.     Pulses: Normal pulses.     Heart sounds: Normal heart sounds.  Pulmonary:     Effort: Pulmonary effort is normal.     Breath sounds: Normal breath sounds.  Abdominal:     General: Abdomen is flat. Bowel sounds are normal.     Palpations: Abdomen is soft.  Musculoskeletal:        General: Normal range of motion.     Cervical back: Normal range of motion.  Skin:    General: Skin is warm and dry.  Neurological:     General: No focal deficit present.     Mental Status: She is alert and oriented to person, place, and time. Mental status is at baseline.  Psychiatric:        Mood and Affect: Mood normal.        Behavior: Behavior normal.      EKG: none today  Recent Labs: 11/01/2022: TSH 4.840 02/02/2023: ALT 17; BUN 24; Creatinine, Ser 1.00; Hemoglobin 14.7; Platelets 350; Potassium 4.2; Sodium 135    Lipid Panel    Component Value Date/Time   CHOL 215 (H) 11/01/2022 1035   CHOL 190 05/18/2012 0928   TRIG 153 (H) 11/01/2022 1035   TRIG 138 05/18/2012 0928   HDL 41 11/01/2022 1035   HDL 35 (L) 05/18/2012 0928   CHOLHDL 5.2 (H) 11/01/2022 1035   VLDL 28 05/18/2012 0928   LDLCALC 146 (H) 11/01/2022 1035   LDLCALC 127 (H) 05/18/2012 0928     Previous testing reviewed today: Patient: 44 - Patricia Walker DOB:  1945/06/27  Date:  01/20/2020 10:00 Provider: Adrian Blackwater MD Encounter: ALL ANGIOGRAMS (CTA BRAIN, CAROTIDS, RENAL ARTERIES, PE)     TESTS  Imaging: Computed Tomographic Angiography:  Cardiac multidetector CT was performed paying particular attention to the coronary arteries for the diagnosis of: CAD. Diagnostic Drugs:  Administered iohexol (Omnipaque) through an  ROS:     Review of Systems  Constitutional: Negative.   HENT: Negative.    Eyes: Negative.   Respiratory:  Positive for cough and sputum production. Negative for shortness of breath.   Cardiovascular:  Positive for chest pain.  Gastrointestinal: Negative.  Negative for abdominal pain, constipation and diarrhea.  Genitourinary: Negative.   Musculoskeletal:  Negative for joint pain and myalgias.  Skin: Negative.   Neurological: Negative.  Negative for dizziness and headaches.  Endo/Heme/Allergies: Negative.   All other systems reviewed and are negative.     All other systems are reviewed and negative.    PHYSICAL EXAM: VS:  BP 138/72   Pulse 73   Wt 293 lb 12.8 oz (133.3 kg)   SpO2 95%   BMI 50.43 kg/m  , BMI Body mass index is 50.43 kg/m. Last weight:  Wt Readings from Last 3 Encounters:  02/03/23 293 lb 12.8 oz (133.3 kg)  02/02/23 298 lb (135.2 kg)  01/31/23 298 lb (135.2 kg)     Physical Exam Vitals and nursing note reviewed.  Constitutional:      Appearance: Normal appearance. She is normal weight.  HENT:     Head: Normocephalic and atraumatic.     Nose: Nose normal.     Mouth/Throat:     Mouth: Mucous membranes are moist.     Pharynx: Oropharynx is clear.  Eyes:      Conjunctiva/sclera: Conjunctivae normal.     Pupils: Pupils are equal, round, and reactive to light.  Cardiovascular:     Rate and Rhythm: Normal rate and regular rhythm.     Pulses: Normal pulses.     Heart sounds: Normal heart sounds.  Pulmonary:     Effort: Pulmonary effort is normal.     Breath sounds: Normal breath sounds.  Abdominal:     General: Abdomen is flat. Bowel sounds are normal.     Palpations: Abdomen is soft.  Musculoskeletal:        General: Normal range of motion.     Cervical back: Normal range of motion.  Skin:    General: Skin is warm and dry.  Neurological:     General: No focal deficit present.     Mental Status: She is alert and oriented to person, place, and time. Mental status is at baseline.  Psychiatric:        Mood and Affect: Mood normal.        Behavior: Behavior normal.      EKG: none today  Recent Labs: 11/01/2022: TSH 4.840 02/02/2023: ALT 17; BUN 24; Creatinine, Ser 1.00; Hemoglobin 14.7; Platelets 350; Potassium 4.2; Sodium 135    Lipid Panel    Component Value Date/Time   CHOL 215 (H) 11/01/2022 1035   CHOL 190 05/18/2012 0928   TRIG 153 (H) 11/01/2022 1035   TRIG 138 05/18/2012 0928   HDL 41 11/01/2022 1035   HDL 35 (L) 05/18/2012 0928   CHOLHDL 5.2 (H) 11/01/2022 1035   VLDL 28 05/18/2012 0928   LDLCALC 146 (H) 11/01/2022 1035   LDLCALC 127 (H) 05/18/2012 0928     Previous testing reviewed today: Patient: 44 - Patricia Walker DOB:  1945/06/27  Date:  01/20/2020 10:00 Provider: Adrian Blackwater MD Encounter: ALL ANGIOGRAMS (CTA BRAIN, CAROTIDS, RENAL ARTERIES, PE)     TESTS  Imaging: Computed Tomographic Angiography:  Cardiac multidetector CT was performed paying particular attention to the coronary arteries for the diagnosis of: CAD. Diagnostic Drugs:  Administered iohexol (Omnipaque) through an  Cardiology Office Note   Date:  02/03/2023   ID:  Patricia Walker, DOB 13-Sep-1945, MRN [id number]  PCP:  Miki Kins, FNP  Cardiologist:  Marisue Ivan, NP      History of Present Illness: Patricia Walker is a 77 y.o. female who presents for No chief complaint on file.   Patient in office for abnormal EKG. Patient saw PCP earlier this week, was in atrial fibrillation with RVR. Metoprolol was started, patient already on Eliquis. Patient went to ED yesterday for chest pain, negative troponin levels, EKG atrial fibrillation with slow ventricular response. Diagnosed with bronchitis.       Past Medical History:  Diagnosis Date   Anxiety    Arthritis    Asthma    COPD exacerbation (HCC) 10/08/2020   Coronary artery disease    mild, nonobstructive   Depression    Dyspnea    on exertion   Dysrhythmia    Atrial Fibrillation   GERD (gastroesophageal reflux disease)    Heart murmur    Hyperlipidemia    Hypertension    Hypothyroidism    MI, old 2016   nonobstructive CAD by Cath   Morbid obesity (HCC)    Persistent atrial fibrillation (HCC)    Pneumonia 06/2018   and RSV   Psoriasis    Sleep apnea    compliant with CPAP   Thyroid disease      Past Surgical History:  Procedure Laterality Date   ARTERY BIOPSY Right 07/21/2017   Procedure: BIOPSY TEMPORAL ARTERY;  Surgeon: Renford Dills, MD;  Location: [facility];  Service: Vascular;  Laterality: Right;   CARDIAC CATHETERIZATION     CARDIOVERSION Right 09/01/2016   Procedure: Cardioversion;  Surgeon: Laurier Nancy, MD;  Location: [facility];  Service: Cardiovascular;  Laterality: Right;   CARDIOVERSION N/A 09/09/2016   Procedure: Cardioversion;  Surgeon: Laurier Nancy, MD;  Location: [facility];  Service: Cardiovascular;  Laterality: N/A;   CATARACT EXTRACTION W/PHACO Right 03/26/2019   Procedure: CATARACT EXTRACTION PHACO AND INTRAOCULAR LENS PLACEMENT (IOC) RIGHT 6.45, 00:39.9;  Surgeon: Galen Manila, MD;   Location: [facility];  Service: Ophthalmology;  Laterality: Right;   CATARACT EXTRACTION W/PHACO Left 04/16/2019   Procedure: CATARACT EXTRACTION PHACO AND INTRAOCULAR LENS PLACEMENT (IOC) LEFT;   3.14, 00:24.9;  Surgeon: Galen Manila, MD;  Location: [facility];  Service: Ophthalmology;  Laterality: Left;  sleep apnea-CPAP   COLONOSCOPY WITH PROPOFOL N/A 08/28/2019   Procedure: COLONOSCOPY WITH PROPOFOL;  Surgeon: Toledo, Boykin Nearing, MD;  Location: [facility];  Service: Gastroenterology;  Laterality: N/A;   ELECTROPHYSIOLOGIC STUDY N/A 02/01/2016   Procedure: CARDIOVERSION;  Surgeon: Laurier Nancy, MD;  Location: [facility];  Service: Cardiovascular;  Laterality: N/A;   ESOPHAGEAL DILATION     ESOPHAGOGASTRODUODENOSCOPY (EGD) WITH PROPOFOL N/A 02/06/2017   Procedure: ESOPHAGOGASTRODUODENOSCOPY (EGD) WITH PROPOFOL;  Surgeon: Wyline Mood, MD;  Location: [facility];  Service: Gastroenterology;  Laterality: N/A;   ESOPHAGOGASTRODUODENOSCOPY (EGD) WITH PROPOFOL N/A 08/28/2019   Procedure: ESOPHAGOGASTRODUODENOSCOPY (EGD) WITH PROPOFOL;  Surgeon: Toledo, Boykin Nearing, MD;  Location: [facility];  Service: Gastroenterology;  Laterality: N/A;   EUS N/A 02/16/2017   Procedure: FULL UPPER ENDOSCOPIC ULTRASOUND (EUS) RADIAL;  Surgeon: Doren Custard, MD;  Location: [facility];  Service: Gastroenterology;  Laterality: N/A;   LEFT HEART CATH AND CORONARY ANGIOGRAPHY N/A 09/08/2016   Procedure: Left Heart Cath and Coronary Angiography;  Surgeon: Laurier Nancy, MD;  Location: [facility];  ROS:     Review of Systems  Constitutional: Negative.   HENT: Negative.    Eyes: Negative.   Respiratory:  Positive for cough and sputum production. Negative for shortness of breath.   Cardiovascular:  Positive for chest pain.  Gastrointestinal: Negative.  Negative for abdominal pain, constipation and diarrhea.  Genitourinary: Negative.   Musculoskeletal:  Negative for joint pain and myalgias.  Skin: Negative.   Neurological: Negative.  Negative for dizziness and headaches.  Endo/Heme/Allergies: Negative.   All other systems reviewed and are negative.     All other systems are reviewed and negative.    PHYSICAL EXAM: VS:  BP 138/72   Pulse 73   Wt 293 lb 12.8 oz (133.3 kg)   SpO2 95%   BMI 50.43 kg/m  , BMI Body mass index is 50.43 kg/m. Last weight:  Wt Readings from Last 3 Encounters:  02/03/23 293 lb 12.8 oz (133.3 kg)  02/02/23 298 lb (135.2 kg)  01/31/23 298 lb (135.2 kg)     Physical Exam Vitals and nursing note reviewed.  Constitutional:      Appearance: Normal appearance. She is normal weight.  HENT:     Head: Normocephalic and atraumatic.     Nose: Nose normal.     Mouth/Throat:     Mouth: Mucous membranes are moist.     Pharynx: Oropharynx is clear.  Eyes:      Conjunctiva/sclera: Conjunctivae normal.     Pupils: Pupils are equal, round, and reactive to light.  Cardiovascular:     Rate and Rhythm: Normal rate and regular rhythm.     Pulses: Normal pulses.     Heart sounds: Normal heart sounds.  Pulmonary:     Effort: Pulmonary effort is normal.     Breath sounds: Normal breath sounds.  Abdominal:     General: Abdomen is flat. Bowel sounds are normal.     Palpations: Abdomen is soft.  Musculoskeletal:        General: Normal range of motion.     Cervical back: Normal range of motion.  Skin:    General: Skin is warm and dry.  Neurological:     General: No focal deficit present.     Mental Status: She is alert and oriented to person, place, and time. Mental status is at baseline.  Psychiatric:        Mood and Affect: Mood normal.        Behavior: Behavior normal.      EKG: none today  Recent Labs: 11/01/2022: TSH 4.840 02/02/2023: ALT 17; BUN 24; Creatinine, Ser 1.00; Hemoglobin 14.7; Platelets 350; Potassium 4.2; Sodium 135    Lipid Panel    Component Value Date/Time   CHOL 215 (H) 11/01/2022 1035   CHOL 190 05/18/2012 0928   TRIG 153 (H) 11/01/2022 1035   TRIG 138 05/18/2012 0928   HDL 41 11/01/2022 1035   HDL 35 (L) 05/18/2012 0928   CHOLHDL 5.2 (H) 11/01/2022 1035   VLDL 28 05/18/2012 0928   LDLCALC 146 (H) 11/01/2022 1035   LDLCALC 127 (H) 05/18/2012 0928     Previous testing reviewed today: Patient: 44 - Patricia Walker DOB:  1945/06/27  Date:  01/20/2020 10:00 Provider: Adrian Blackwater MD Encounter: ALL ANGIOGRAMS (CTA BRAIN, CAROTIDS, RENAL ARTERIES, PE)     TESTS  Imaging: Computed Tomographic Angiography:  Cardiac multidetector CT was performed paying particular attention to the coronary arteries for the diagnosis of: CAD. Diagnostic Drugs:  Administered iohexol (Omnipaque) through an  antecubital vein and images from the examination were analyzed for the presence and extent of coronary artery disease, using  3D image processing software. 100 mL of non-ionic contrast (Omnipaque) was used.   TEST CONCLUSIONS  Quality of study: Good  1-Calcium scoer: 300.9  2-Right dominant system.  3-Calcified mid LAD and RCA with mild disease. LCX has no significant disease.   Adrian Blackwater MD  Electronically signed by: Adrian Blackwater     Date: 01/21/2020 13:15  Patient: [id number] - Patricia Walker DOB:  September 17, 1945  Date:  01/20/2020 09:00 Provider: Adrian Blackwater MD Encounter: ECHO    TESTS  Imaging: Echocardiogram:  An echocardiogram in (2-d) mode was performed and in Doppler mode with color flow velocity mapping was performed. The aortic valve cusps are normal 1.6 cm, flow velocity was normal 1.3 m/s, and normal calculated aortic valve systolic mean flow gradient 4.0 mmHg. Mitral valve diastolic peak flow velocity E 1.0 m/s and E/A ratio 4.0. LVOT flow velocity was normal 1.1 m/s. LV systolic dimension 1.8 cm, diastolic 2.9 cm, posterior wall thickness 1.8 cm, fractional shortening 35 %, and EF 67 %. IVS thickness 2.0 cm. LA dimension 7.2 cm  RIGHT atrium= 16.1 cm2. Mitral Valve =  Ea=8.3  DT= 165 msec. Tricuspid Valve =  TR jet V= 3.0     RAP= 5  RVSP= 43.0 mmHg. Mitral Valve has Mild Regurgitation. Pulmonic Valve has Trace Regurgitation. Tricuspid Valve has Mild to Moderate Regurgitation.     ASSESSMENT  Technically difficult study due to body habitus.  Unable to visualize and evaluate right side of heart.   Severely dilated left atrium and mildly dilated right atrium with ventricles and aorta appearing normal in size.  Normal left ventricular systolic function.  Moderate left ventricular hypertrophy with GRADE 3 (restrictive physiology) diastolic dysfunction.  Normal left ventricular wall motion.  Trace pulmonary regurgitation.  Mild to moderate tricuspid regurgitation.  Mild pulmonary hypertension.  Mild mitral regurgitation.  No pericardial effusion.  No subcostal view.     THERAPY    Referring physician: Laurier Nancy  Sonographer: Richardson Landry, RCS.   Adrian Blackwater MD  Electronically signed by: Adrian Blackwater     Date: 01/21/2020 13:16  Patient: [age] - Patricia Walker DOB:  09/04/1945  Date:  07/06/2017 08:45 Provider: Adrian Blackwater MD Encounter: [facility]   [facility] [address] [place], [place] [address] [phone] STUDY:  Rest / Gated Stress Myocardial Perfusion With Wall Motion, Left Ventricular Ejection Fraction.Persantine Stress Test. SEX:      Female  Cardiology Office Note   Date:  02/03/2023   ID:  Patricia Walker, DOB 13-Sep-1945, MRN [id number]  PCP:  Miki Kins, FNP  Cardiologist:  Marisue Ivan, NP      History of Present Illness: Patricia Walker is a 77 y.o. female who presents for No chief complaint on file.   Patient in office for abnormal EKG. Patient saw PCP earlier this week, was in atrial fibrillation with RVR. Metoprolol was started, patient already on Eliquis. Patient went to ED yesterday for chest pain, negative troponin levels, EKG atrial fibrillation with slow ventricular response. Diagnosed with bronchitis.       Past Medical History:  Diagnosis Date   Anxiety    Arthritis    Asthma    COPD exacerbation (HCC) 10/08/2020   Coronary artery disease    mild, nonobstructive   Depression    Dyspnea    on exertion   Dysrhythmia    Atrial Fibrillation   GERD (gastroesophageal reflux disease)    Heart murmur    Hyperlipidemia    Hypertension    Hypothyroidism    MI, old 2016   nonobstructive CAD by Cath   Morbid obesity (HCC)    Persistent atrial fibrillation (HCC)    Pneumonia 06/2018   and RSV   Psoriasis    Sleep apnea    compliant with CPAP   Thyroid disease      Past Surgical History:  Procedure Laterality Date   ARTERY BIOPSY Right 07/21/2017   Procedure: BIOPSY TEMPORAL ARTERY;  Surgeon: Renford Dills, MD;  Location: [facility];  Service: Vascular;  Laterality: Right;   CARDIAC CATHETERIZATION     CARDIOVERSION Right 09/01/2016   Procedure: Cardioversion;  Surgeon: Laurier Nancy, MD;  Location: [facility];  Service: Cardiovascular;  Laterality: Right;   CARDIOVERSION N/A 09/09/2016   Procedure: Cardioversion;  Surgeon: Laurier Nancy, MD;  Location: [facility];  Service: Cardiovascular;  Laterality: N/A;   CATARACT EXTRACTION W/PHACO Right 03/26/2019   Procedure: CATARACT EXTRACTION PHACO AND INTRAOCULAR LENS PLACEMENT (IOC) RIGHT 6.45, 00:39.9;  Surgeon: Galen Manila, MD;   Location: [facility];  Service: Ophthalmology;  Laterality: Right;   CATARACT EXTRACTION W/PHACO Left 04/16/2019   Procedure: CATARACT EXTRACTION PHACO AND INTRAOCULAR LENS PLACEMENT (IOC) LEFT;   3.14, 00:24.9;  Surgeon: Galen Manila, MD;  Location: [facility];  Service: Ophthalmology;  Laterality: Left;  sleep apnea-CPAP   COLONOSCOPY WITH PROPOFOL N/A 08/28/2019   Procedure: COLONOSCOPY WITH PROPOFOL;  Surgeon: Toledo, Boykin Nearing, MD;  Location: [facility];  Service: Gastroenterology;  Laterality: N/A;   ELECTROPHYSIOLOGIC STUDY N/A 02/01/2016   Procedure: CARDIOVERSION;  Surgeon: Laurier Nancy, MD;  Location: [facility];  Service: Cardiovascular;  Laterality: N/A;   ESOPHAGEAL DILATION     ESOPHAGOGASTRODUODENOSCOPY (EGD) WITH PROPOFOL N/A 02/06/2017   Procedure: ESOPHAGOGASTRODUODENOSCOPY (EGD) WITH PROPOFOL;  Surgeon: Wyline Mood, MD;  Location: [facility];  Service: Gastroenterology;  Laterality: N/A;   ESOPHAGOGASTRODUODENOSCOPY (EGD) WITH PROPOFOL N/A 08/28/2019   Procedure: ESOPHAGOGASTRODUODENOSCOPY (EGD) WITH PROPOFOL;  Surgeon: Toledo, Boykin Nearing, MD;  Location: [facility];  Service: Gastroenterology;  Laterality: N/A;   EUS N/A 02/16/2017   Procedure: FULL UPPER ENDOSCOPIC ULTRASOUND (EUS) RADIAL;  Surgeon: Doren Custard, MD;  Location: [facility];  Service: Gastroenterology;  Laterality: N/A;   LEFT HEART CATH AND CORONARY ANGIOGRAPHY N/A 09/08/2016   Procedure: Left Heart Cath and Coronary Angiography;  Surgeon: Laurier Nancy, MD;  Location: [facility];  ROS:     Review of Systems  Constitutional: Negative.   HENT: Negative.    Eyes: Negative.   Respiratory:  Positive for cough and sputum production. Negative for shortness of breath.   Cardiovascular:  Positive for chest pain.  Gastrointestinal: Negative.  Negative for abdominal pain, constipation and diarrhea.  Genitourinary: Negative.   Musculoskeletal:  Negative for joint pain and myalgias.  Skin: Negative.   Neurological: Negative.  Negative for dizziness and headaches.  Endo/Heme/Allergies: Negative.   All other systems reviewed and are negative.     All other systems are reviewed and negative.    PHYSICAL EXAM: VS:  BP 138/72   Pulse 73   Wt 293 lb 12.8 oz (133.3 kg)   SpO2 95%   BMI 50.43 kg/m  , BMI Body mass index is 50.43 kg/m. Last weight:  Wt Readings from Last 3 Encounters:  02/03/23 293 lb 12.8 oz (133.3 kg)  02/02/23 298 lb (135.2 kg)  01/31/23 298 lb (135.2 kg)     Physical Exam Vitals and nursing note reviewed.  Constitutional:      Appearance: Normal appearance. She is normal weight.  HENT:     Head: Normocephalic and atraumatic.     Nose: Nose normal.     Mouth/Throat:     Mouth: Mucous membranes are moist.     Pharynx: Oropharynx is clear.  Eyes:      Conjunctiva/sclera: Conjunctivae normal.     Pupils: Pupils are equal, round, and reactive to light.  Cardiovascular:     Rate and Rhythm: Normal rate and regular rhythm.     Pulses: Normal pulses.     Heart sounds: Normal heart sounds.  Pulmonary:     Effort: Pulmonary effort is normal.     Breath sounds: Normal breath sounds.  Abdominal:     General: Abdomen is flat. Bowel sounds are normal.     Palpations: Abdomen is soft.  Musculoskeletal:        General: Normal range of motion.     Cervical back: Normal range of motion.  Skin:    General: Skin is warm and dry.  Neurological:     General: No focal deficit present.     Mental Status: She is alert and oriented to person, place, and time. Mental status is at baseline.  Psychiatric:        Mood and Affect: Mood normal.        Behavior: Behavior normal.      EKG: none today  Recent Labs: 11/01/2022: TSH 4.840 02/02/2023: ALT 17; BUN 24; Creatinine, Ser 1.00; Hemoglobin 14.7; Platelets 350; Potassium 4.2; Sodium 135    Lipid Panel    Component Value Date/Time   CHOL 215 (H) 11/01/2022 1035   CHOL 190 05/18/2012 0928   TRIG 153 (H) 11/01/2022 1035   TRIG 138 05/18/2012 0928   HDL 41 11/01/2022 1035   HDL 35 (L) 05/18/2012 0928   CHOLHDL 5.2 (H) 11/01/2022 1035   VLDL 28 05/18/2012 0928   LDLCALC 146 (H) 11/01/2022 1035   LDLCALC 127 (H) 05/18/2012 0928     Previous testing reviewed today: Patient: 44 - Patricia Walker DOB:  1945/06/27  Date:  01/20/2020 10:00 Provider: Adrian Blackwater MD Encounter: ALL ANGIOGRAMS (CTA BRAIN, CAROTIDS, RENAL ARTERIES, PE)     TESTS  Imaging: Computed Tomographic Angiography:  Cardiac multidetector CT was performed paying particular attention to the coronary arteries for the diagnosis of: CAD. Diagnostic Drugs:  Administered iohexol (Omnipaque) through an  antecubital vein and images from the examination were analyzed for the presence and extent of coronary artery disease, using  3D image processing software. 100 mL of non-ionic contrast (Omnipaque) was used.   TEST CONCLUSIONS  Quality of study: Good  1-Calcium scoer: 300.9  2-Right dominant system.  3-Calcified mid LAD and RCA with mild disease. LCX has no significant disease.   Adrian Blackwater MD  Electronically signed by: Adrian Blackwater     Date: 01/21/2020 13:15  Patient: [id number] - Patricia Walker DOB:  September 17, 1945  Date:  01/20/2020 09:00 Provider: Adrian Blackwater MD Encounter: ECHO    TESTS  Imaging: Echocardiogram:  An echocardiogram in (2-d) mode was performed and in Doppler mode with color flow velocity mapping was performed. The aortic valve cusps are normal 1.6 cm, flow velocity was normal 1.3 m/s, and normal calculated aortic valve systolic mean flow gradient 4.0 mmHg. Mitral valve diastolic peak flow velocity E 1.0 m/s and E/A ratio 4.0. LVOT flow velocity was normal 1.1 m/s. LV systolic dimension 1.8 cm, diastolic 2.9 cm, posterior wall thickness 1.8 cm, fractional shortening 35 %, and EF 67 %. IVS thickness 2.0 cm. LA dimension 7.2 cm  RIGHT atrium= 16.1 cm2. Mitral Valve =  Ea=8.3  DT= 165 msec. Tricuspid Valve =  TR jet V= 3.0     RAP= 5  RVSP= 43.0 mmHg. Mitral Valve has Mild Regurgitation. Pulmonic Valve has Trace Regurgitation. Tricuspid Valve has Mild to Moderate Regurgitation.     ASSESSMENT  Technically difficult study due to body habitus.  Unable to visualize and evaluate right side of heart.   Severely dilated left atrium and mildly dilated right atrium with ventricles and aorta appearing normal in size.  Normal left ventricular systolic function.  Moderate left ventricular hypertrophy with GRADE 3 (restrictive physiology) diastolic dysfunction.  Normal left ventricular wall motion.  Trace pulmonary regurgitation.  Mild to moderate tricuspid regurgitation.  Mild pulmonary hypertension.  Mild mitral regurgitation.  No pericardial effusion.  No subcostal view.     THERAPY    Referring physician: Laurier Nancy  Sonographer: Richardson Landry, RCS.   Adrian Blackwater MD  Electronically signed by: Adrian Blackwater     Date: 01/21/2020 13:16  Patient: [id number] - Patricia Walker DOB:  09/04/1945  Date:  07/06/2017 08:45 Provider: Adrian Blackwater MD Encounter: [facility]   [facility] [address] [place], [place] [address] [phone] STUDY:  Rest / Gated Stress Myocardial Perfusion With Wall Motion, Left Ventricular Ejection Fraction.Persantine Stress Test. SEX:      Female
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
## 2023-02-14 ENCOUNTER — Ambulatory Visit: Payer: Medicare HMO | Admitting: Orthopedic Surgery

## 2023-02-15 ENCOUNTER — Inpatient Hospital Stay
Admission: RE | Admit: 2023-02-15 | Discharge: 2023-02-15 | Disposition: A | Discharge status: Home / Self Care | Payer: Self-pay | Source: Ambulatory Visit | Attending: Orthopedic Surgery | Admitting: Orthopedic Surgery

## 2023-02-15 ENCOUNTER — Other Ambulatory Visit: Payer: Self-pay | Admitting: Family Medicine

## 2023-02-15 DIAGNOSIS — Z049 Encounter for examination and observation for unspecified reason: Secondary | ICD-10-CM

## 2023-02-16 ENCOUNTER — Other Ambulatory Visit: Payer: Self-pay | Admitting: Cardiology

## 2023-02-16 DIAGNOSIS — I4891 Unspecified atrial fibrillation: Secondary | ICD-10-CM

## 2023-02-16 DIAGNOSIS — R0609 Other forms of dyspnea: Secondary | ICD-10-CM

## 2023-02-17 ENCOUNTER — Ambulatory Visit (INDEPENDENT_AMBULATORY_CARE_PROVIDER_SITE_OTHER): Payer: Medicare HMO

## 2023-02-17 DIAGNOSIS — I4891 Unspecified atrial fibrillation: Secondary | ICD-10-CM | POA: Diagnosis not present

## 2023-02-17 DIAGNOSIS — I351 Nonrheumatic aortic (valve) insufficiency: Secondary | ICD-10-CM

## 2023-02-21 ENCOUNTER — Telehealth: Payer: Self-pay

## 2023-02-21 NOTE — Telephone Encounter (Signed)
Patient wants to know if you can call her in something for cough and congestion.

## 2023-02-23 ENCOUNTER — Ambulatory Visit
Admission: RE | Admit: 2023-02-23 | Discharge: 2023-02-23 | Disposition: A | Discharge status: Home / Self Care | Payer: Medicare HMO | Source: Ambulatory Visit | Attending: Family | Admitting: Family

## 2023-02-23 ENCOUNTER — Encounter: Payer: Self-pay | Admitting: Family

## 2023-02-23 ENCOUNTER — Ambulatory Visit
Admission: RE | Admit: 2023-02-23 | Discharge: 2023-02-23 | Disposition: A | Discharge status: Home / Self Care | Payer: Medicare HMO | Attending: Family | Admitting: Family

## 2023-02-23 ENCOUNTER — Ambulatory Visit: Payer: Medicare HMO | Admitting: Family

## 2023-02-23 VITALS — BP 120/74 | HR 82 | Ht 64.0 in | Wt 296.0 lb

## 2023-02-23 DIAGNOSIS — R051 Acute cough: Secondary | ICD-10-CM

## 2023-02-23 DIAGNOSIS — Z0130 Encounter for examination of blood pressure without abnormal findings: Secondary | ICD-10-CM

## 2023-02-23 DIAGNOSIS — R062 Wheezing: Secondary | ICD-10-CM | POA: Diagnosis not present

## 2023-02-23 DIAGNOSIS — R0600 Dyspnea, unspecified: Secondary | ICD-10-CM | POA: Diagnosis not present

## 2023-02-23 DIAGNOSIS — R059 Cough, unspecified: Secondary | ICD-10-CM | POA: Diagnosis not present

## 2023-02-23 DIAGNOSIS — R0602 Shortness of breath: Secondary | ICD-10-CM | POA: Diagnosis not present

## 2023-02-23 LAB — POCT XPERT XPRESS SARS COVID-2/FLU/RSV
FLU A: NEGATIVE
FLU B: NEGATIVE
RSV RNA, PCR: NEGATIVE
SARS Coronavirus 2: NEGATIVE

## 2023-02-23 MED ORDER — LEVOFLOXACIN 500 MG PO TABS: 500.0000 mg | ORAL_TABLET | Freq: Every day | ORAL | 0 refills | Status: AC

## 2023-02-23 MED ORDER — HYDROCOD POLI-CHLORPHE POLI ER 10-8 MG/5ML PO SUER
5.0000 mL | Freq: Two times a day (BID) | ORAL | 0 refills | Status: DC | PRN
Start: 2023-02-23 — End: 2023-07-03

## 2023-02-23 NOTE — Unmapped (Signed)
[facility] Refill Coordination Note    Specialty Medication(s) to be Shipped:   Inflammatory Disorders: Taltz    Other medication(s) to be shipped: No additional medications requested for fill at this time     Lisa Carlson, DOB: 10/23/1945  Phone: [phone] (home)       All above HIPAA information was verified with patient.     Was a Nurse, learning disability used for this call? No    Completed refill call assessment today to schedule patient's medication shipment from the [facility] [phone]).  All relevant notes have been reviewed.     Specialty medication(s) and dose(s) confirmed: Regimen is correct and unchanged.   Changes to medications: Lisa Carlson reports no changes at this time.  Changes to insurance: No  New side effects reported not previously addressed with a pharmacist or physician: None reported  Questions for the pharmacist: No    Confirmed patient received a Conservation officer, historic buildings and a Surveyor, mining with first shipment. The patient will receive a drug information handout for each medication shipped and additional FDA Medication Guides as required.       DISEASE/MEDICATION-SPECIFIC INFORMATION        For patients on injectable medications: Patient currently has 0 doses left.  Next injection is scheduled for 10/10-11/24.    SPECIALTY MEDICATION ADHERENCE     Medication Adherence    Patient reported X missed doses in the last month: 0  Specialty Medication: ixekizumab (TALTZ AUTOINJECTOR) 80 mg/mL AtIn auto-injector  Patient is on additional specialty medications: No  Informant: patient              Were doses missed due to medication being on hold? No    TALTZ AUTOINJECTOR 80   mg/ml: 0 days of medicine on hand       REFERRAL TO PHARMACIST     Referral to the pharmacist: Not needed      [facility]     Shipping address confirmed in Epic.       Delivery Scheduled: Yes, Expected medication delivery date: 02/24/23.     Medication will be delivered via Same Day Courier to the prescription address in Epic WAM.    [name]   [facility] Specialty Technician

## 2023-02-24 ENCOUNTER — Ambulatory Visit: Payer: Medicare HMO | Admitting: Cardiovascular Disease

## 2023-02-24 MED FILL — TALTZ AUTOINJECTOR 80 MG/ML SUBCUTANEOUS: SUBCUTANEOUS | 28 days supply | Qty: 1 | Fill #5

## 2023-02-24 NOTE — Progress Notes (Signed)
Patient notified

## 2023-02-25 ENCOUNTER — Other Ambulatory Visit: Payer: Self-pay | Admitting: Family

## 2023-03-15 ENCOUNTER — Other Ambulatory Visit: Payer: Self-pay | Admitting: Internal Medicine

## 2023-03-17 ENCOUNTER — Other Ambulatory Visit: Payer: Self-pay | Admitting: Family

## 2023-03-17 NOTE — Unmapped (Signed)
Received fax authorization request for Taltz. Initiated process with Cover My Meds. State no PA needed. Called pharmacy and patient can pick up medication on 03/18/2023.      03/17/23        Allean Found, CMA

## 2023-03-20 ENCOUNTER — Telehealth: Payer: Self-pay

## 2023-03-20 NOTE — Telephone Encounter (Signed)
She is very congested still. Would like to know if you could call her in an antibiotic. Please advise.

## 2023-03-26 DIAGNOSIS — M545 Low back pain, unspecified: Secondary | ICD-10-CM | POA: Insufficient documentation

## 2023-03-26 DIAGNOSIS — G8929 Other chronic pain: Secondary | ICD-10-CM | POA: Insufficient documentation

## 2023-03-26 NOTE — Assessment & Plan Note (Signed)
EKG In office today clearly different from previous.  Discussed with Cardiology - will send metoprolol and have patient setup appt with Cardio this week.

## 2023-03-26 NOTE — Assessment & Plan Note (Signed)
Sending referral to neurosurgery.  Will defer to them for further treatments, as we have tried multiple conservative measures without any improvements.   Reassess at follow up.

## 2023-03-27 MED ORDER — LEVOFLOXACIN 500 MG PO TABS: 500.0000 mg | ORAL_TABLET | Freq: Every day | ORAL | 0 refills | Status: DC

## 2023-03-27 NOTE — Telephone Encounter (Signed)
Done

## 2023-03-27 NOTE — Addendum Note (Signed)
Addended by: Grayling Congress on: 03/27/2023 12:22 PM   Modules accepted: Orders

## 2023-03-27 NOTE — Unmapped (Signed)
[facility] Refill Coordination Note    Specialty Medication(s) to be Shipped:   Inflammatory Disorders: Taltz    Other medication(s) to be shipped: No additional medications requested for fill at this time     Lisa Carlson, DOB: 1945-12-27  Phone: [phone] (home)       All above HIPAA information was verified with patient.     Was a Nurse, learning disability used for this call? No    Completed refill call assessment today to schedule patient's medication shipment from the [facility] [phone]).  All relevant notes have been reviewed.     Specialty medication(s) and dose(s) confirmed: Regimen is correct and unchanged.   Changes to medications: [name] reports no changes at this time.  Changes to insurance: No  New side effects reported not previously addressed with a pharmacist or physician: None reported  Questions for the pharmacist: No    Confirmed patient received a Conservation officer, historic buildings and a Surveyor, mining with first shipment. The patient will receive a drug information handout for each medication shipped and additional FDA Medication Guides as required.       DISEASE/MEDICATION-SPECIFIC INFORMATION        For patients on injectable medications: Patient currently has 0 doses left.  Next injection is scheduled for around 12/14. Patient will take missed dose upon delivery.    SPECIALTY MEDICATION ADHERENCE     Medication Adherence    Patient reported X missed doses in the last month: 1  Specialty Medication: ixekizumab (TALTZ AUTOINJECTOR) 80 mg/mL AtIn auto-injector  Patient is on additional specialty medications: No  Informant: patient              Were doses missed due to medication being on hold? No    TALTZ AUTOINJECTOR 80   mg/ml: 0 days of medicine on hand       REFERRAL TO PHARMACIST     Referral to the pharmacist: Not needed      [facility]     Shipping address confirmed in Epic.       Delivery Scheduled: Yes, Expected medication delivery date: 03/28/23. Medication will be delivered via Same Day Courier to the prescription address in Epic WAM.    [name]   [facility] Specialty Technician

## 2023-03-28 MED FILL — TALTZ AUTOINJECTOR 80 MG/ML SUBCUTANEOUS: SUBCUTANEOUS | 28 days supply | Qty: 1 | Fill #6

## 2023-03-29 ENCOUNTER — Other Ambulatory Visit: Payer: Self-pay | Admitting: Family

## 2023-03-29 DIAGNOSIS — Z1231 Encounter for screening mammogram for malignant neoplasm of breast: Secondary | ICD-10-CM

## 2023-04-07 ENCOUNTER — Ambulatory Visit (INDEPENDENT_AMBULATORY_CARE_PROVIDER_SITE_OTHER): Payer: Medicare HMO

## 2023-04-07 DIAGNOSIS — I4891 Unspecified atrial fibrillation: Secondary | ICD-10-CM

## 2023-04-07 DIAGNOSIS — R0609 Other forms of dyspnea: Secondary | ICD-10-CM | POA: Diagnosis not present

## 2023-04-07 MED ORDER — TECHNETIUM TC 99M SESTAMIBI GENERIC - CARDIOLITE
32.2000 | Freq: Once | INTRAVENOUS | Status: AC | PRN
  Administered 2023-04-07: 32.2 via INTRAVENOUS

## 2023-04-07 MED ORDER — TECHNETIUM TC 99M SESTAMIBI GENERIC - CARDIOLITE
10.5000 | Freq: Once | INTRAVENOUS | Status: AC | PRN
  Administered 2023-04-07: 10.5 via INTRAVENOUS

## 2023-04-15 ENCOUNTER — Other Ambulatory Visit: Payer: Self-pay | Admitting: Family

## 2023-04-15 DIAGNOSIS — I2510 Atherosclerotic heart disease of native coronary artery without angina pectoris: Secondary | ICD-10-CM

## 2023-04-23 ENCOUNTER — Encounter: Payer: Self-pay | Admitting: Family

## 2023-04-27 ENCOUNTER — Other Ambulatory Visit: Payer: Self-pay | Admitting: Family

## 2023-04-27 NOTE — Unmapped (Signed)
[facility] Refill Coordination Note    Specialty Medication(s) to be Shipped:   Inflammatory Disorders: Taltz    Other medication(s) to be shipped: No additional medications requested for fill at this time     Lisa Carlson, DOB: [birth year]  Phone: [phone] (home)       All above HIPAA information was verified with patient.     Was a Nurse, learning disability used for this call? No    Completed refill call assessment today to schedule patient's medication shipment from the [facility] [phone]).  All relevant notes have been reviewed.     Specialty medication(s) and dose(s) confirmed: Regimen is correct and unchanged.   Changes to medications: Lisa Carlson reports no changes at this time.  Changes to insurance: No  New side effects reported not previously addressed with a pharmacist or physician: None reported  Questions for the pharmacist: No    Confirmed patient received a Conservation officer, historic buildings and a Surveyor, mining with first shipment. The patient will receive a drug information handout for each medication shipped and additional FDA Medication Guides as required.       DISEASE/MEDICATION-SPECIFIC INFORMATION        For patients on injectable medications: Patient currently has 0 doses left.  Next injection is scheduled for around 1/15. Patient will take missed dose upon delivery.    SPECIALTY MEDICATION ADHERENCE     Medication Adherence    Patient reported X missed doses in the last month: 1  Specialty Medication: ixekizumab (TALTZ AUTOINJECTOR) 80 mg/mL AtIn auto-injector  Patient is on additional specialty medications: No  Informant: patient              Were doses missed due to medication being on hold? No    TALTZ AUTOINJECTOR 80   mg/ml: 0 days of medicine on hand       REFERRAL TO PHARMACIST     Referral to the pharmacist: Yes - routine compliance concerns. Patient has missed 1-3 doses of medication. Refills were scheduled and concern routed to pharmacist for evaluation.      SHIPPING     Shipping address confirmed in Epic.       Delivery Scheduled: Yes, Expected medication delivery date: 05/03/22.     Medication will be delivered via Same Day Courier to the prescription address in Epic WAM.    Lisa Carlson   [facility] Specialty Technician

## 2023-04-27 NOTE — Unmapped (Signed)
This pharmacist was notified by a technician that this patient has reported that they've missed 1 doses of their Taltz.. I have reviewed the patient's medical record and have determined that have determined that no further pharmacist action is needed. It appears dose Mchaffie be delayed about a week due to pharmacy staffing/ability to ship because of weather and holiday delays.    Ancel Easler A. Katrinka Blazing, PharmD, BCPS - Clinical Pharmacist   [facility]    [address], [place], [place] [address]  t [phone], opt 4 then 2 - f [phone]

## 2023-05-04 ENCOUNTER — Encounter: Payer: Self-pay | Admitting: Cardiology

## 2023-05-04 ENCOUNTER — Ambulatory Visit: Payer: Medicare HMO | Admitting: Cardiology

## 2023-05-04 VITALS — BP 112/74 | HR 61 | Ht 64.0 in | Wt 295.0 lb

## 2023-05-04 DIAGNOSIS — L249 Irritant contact dermatitis, unspecified cause: Secondary | ICD-10-CM | POA: Insufficient documentation

## 2023-05-04 DIAGNOSIS — Z0130 Encounter for examination of blood pressure without abnormal findings: Secondary | ICD-10-CM

## 2023-05-04 MED ORDER — METHYLPREDNISOLONE 4 MG PO TBPK: ORAL_TABLET | ORAL | 0 refills | Status: DC

## 2023-05-04 MED ORDER — CEPHALEXIN 500 MG PO CAPS: 500.0000 mg | ORAL_CAPSULE | Freq: Four times a day (QID) | ORAL | 0 refills | Status: AC

## 2023-05-04 MED FILL — TALTZ AUTOINJECTOR 80 MG/ML SUBCUTANEOUS: SUBCUTANEOUS | 28 days supply | Qty: 1 | Fill #7

## 2023-05-04 NOTE — Progress Notes (Signed)
Established Patient Office Visit  Subjective:  Patient ID: Patricia Walker, female    DOB: [birth year]  Age: 78 y.o. MRN: [id number]  Chief Complaint  Patient presents with   Acute Visit    Bite or Infection on L side of her face    Patient in office for an acute visit, complaining of a bite or infection on the left side of her face. Started 3 days ago. Left side of face red, warm to touch, burning, itching, reports occasional lip numbness. Not pruritic, not associated with fever, arthralgias, URI symptoms, or other recent viral syndrome. Negative for bumps or vesicles. Will send in Keflex and medrol dose pack.   Rash This is a new problem. The current episode started in the past 7 days. The problem is unchanged. The affected locations include the face. The rash is characterized by burning, itchiness and redness. She was exposed to nothing. Pertinent negatives include no diarrhea, facial edema, fever, joint pain, shortness of breath, sore throat or vomiting. Treatments tried: peroxide, alcohol. The treatment provided no relief.    No other concerns at this time.   Past Medical History:  Diagnosis Date   Anxiety    Arthritis    Asthma    COPD exacerbation (HCC) 10/08/2020   Coronary artery disease    mild, nonobstructive   Depression    Dyspnea    on exertion   Dysrhythmia    Atrial Fibrillation   GERD (gastroesophageal reflux disease)    Heart murmur    Hyperlipidemia    Hypertension    Hypothyroidism    MI, old 2016   nonobstructive CAD by Cath   Morbid obesity (HCC)    Persistent atrial fibrillation (HCC)    Pneumonia 06/2018   and RSV   Psoriasis    Sleep apnea    compliant with CPAP   Thyroid disease     Past Surgical History:  Procedure Laterality Date   ARTERY BIOPSY Right 07/21/2017   Procedure: BIOPSY TEMPORAL ARTERY;  Surgeon: Renford Dills, MD;  Location: [facility];  Service: Vascular;  Laterality: Right;   CARDIAC CATHETERIZATION     CARDIOVERSION  Right 09/01/2016   Procedure: Cardioversion;  Surgeon: Laurier Nancy, MD;  Location: [facility];  Service: Cardiovascular;  Laterality: Right;   CARDIOVERSION N/A 09/09/2016   Procedure: Cardioversion;  Surgeon: Laurier Nancy, MD;  Location: [facility];  Service: Cardiovascular;  Laterality: N/A;   CATARACT EXTRACTION W/PHACO Right 03/26/2019   Procedure: CATARACT EXTRACTION PHACO AND INTRAOCULAR LENS PLACEMENT (IOC) RIGHT 6.45, 00:39.9;  Surgeon: Galen Manila, MD;  Location: [facility];  Service: Ophthalmology;  Laterality: Right;   CATARACT EXTRACTION W/PHACO Left 04/16/2019   Procedure: CATARACT EXTRACTION PHACO AND INTRAOCULAR LENS PLACEMENT (IOC) LEFT;   3.14, 00:24.9;  Surgeon: Galen Manila, MD;  Location: [facility];  Service: Ophthalmology;  Laterality: Left;  sleep apnea-CPAP   COLONOSCOPY WITH PROPOFOL N/A 08/28/2019   Procedure: COLONOSCOPY WITH PROPOFOL;  Surgeon: Toledo, Boykin Nearing, MD;  Location: [facility];  Service: Gastroenterology;  Laterality: N/A;   ELECTROPHYSIOLOGIC STUDY N/A 02/01/2016   Procedure: CARDIOVERSION;  Surgeon: Laurier Nancy, MD;  Location: [facility];  Service: Cardiovascular;  Laterality: N/A;   ESOPHAGEAL DILATION     ESOPHAGOGASTRODUODENOSCOPY (EGD) WITH PROPOFOL N/A 02/06/2017   Procedure: ESOPHAGOGASTRODUODENOSCOPY (EGD) WITH PROPOFOL;  Surgeon: Wyline Mood, MD;  Location: [facility];  Service: Gastroenterology;  Laterality: N/A;   ESOPHAGOGASTRODUODENOSCOPY (EGD) WITH PROPOFOL N/A 08/28/2019   Procedure: ESOPHAGOGASTRODUODENOSCOPY (  EGD) WITH PROPOFOL;  Surgeon: Toledo, Boykin Nearing, MD;  Location: [facility];  Service: Gastroenterology;  Laterality: N/A;   EUS N/A 02/16/2017   Procedure: FULL UPPER ENDOSCOPIC ULTRASOUND (EUS) RADIAL;  Surgeon: Doren Custard, MD;  Location: [facility];  Service: Gastroenterology;  Laterality: N/A;   LEFT HEART CATH AND CORONARY ANGIOGRAPHY N/A 09/08/2016   Procedure: Left Heart Cath and  Coronary Angiography;  Surgeon: Laurier Nancy, MD;  Location: [facility];  Service: Cardiovascular;  Laterality: N/A;   US ECHOCARDIOGRAPHY      Social History   Socioeconomic History   Marital status: Divorced    Spouse name: Not on file   Number of children: Not on file   Years of education: Not on file   Highest education level: Not on file  Occupational History   Not on file  Tobacco Use   Smoking status: Former    Current packs/day: 0.00    Types: Cigarettes    Quit date: 02/01/2013    Years since quitting: 10.2   Smokeless tobacco: Never  Vaping Use   Vaping status: Never Used  Substance and Sexual Activity   Alcohol use: No   Drug use: No   Sexual activity: Not on file  Other Topics Concern   Not on file  Social History Narrative   Lives in [place] with multiple family members   unemployed   Social Drivers of Corporate investment banker Strain: Not on file  Food Insecurity: Not on file  Transportation Needs: Not on file  Physical Activity: Not on file  Stress: Not on file  Social Connections: Not on file  Intimate Partner Violence: Not on file    Family History  Problem Relation Age of Onset   Breast cancer Paternal Aunt    Other Father    Heart attack Father     Allergies  Allergen Reactions   Codeine Hives, Itching and Rash   Other Itching and Other (See Comments)    States antibiotic in the past caused itching but can not remember name    Outpatient Medications Prior to Visit  Medication Sig   albuterol (VENTOLIN HFA) 108 (90 Base) MCG/ACT inhaler INHALE 1-2 PUFFS EVERY 4-6 HOURS AS NEEDED FOR SHORTNESS OF BREATH   atorvastatin (LIPITOR) 40 MG tablet ONCE A DAY FOR CHOLESTEROL   azelastine (ASTELIN) 0.1 % nasal spray Place 2 sprays into both nostrils 2 (two) times daily as needed for rhinitis or allergies.    baclofen (LIORESAL) 10 MG tablet TAKE 1 TABLET BY MOUTH TWICE DAILY AS NEEDED FOR BACK SPASM.   budesonide-formoterol  (SYMBICORT) 160-4.5 MCG/ACT inhaler Inhale 2 puffs into the lungs 2 (two) times daily.   chlorpheniramine-HYDROcodone (TUSSIONEX) 10-8 MG/5ML Take 5 mLs by mouth every 12 (twelve) hours as needed for cough.   diazepam (VALIUM) 2 MG tablet Take 1 tablet (2 mg total) by mouth every 8 (eight) hours as needed for muscle spasms.   ELIQUIS 5 MG TABS tablet TAKE 1 TABLET BY MOUTH TWICE A DAY   esomeprazole (NEXIUM) 40 MG capsule TAKE 1 CAPSULE BY MOUTH EVERY DAY IN THE MORNING   gabapentin (NEURONTIN) 300 MG capsule Take 300 mg by mouth 2 (two) times daily.   HYDROcodone-acetaminophen (NORCO) 5-325 MG tablet Take 1 tablet by mouth every 6 (six) hours as needed for moderate pain.   isosorbide mononitrate (IMDUR) 30 MG 24 hr tablet TAKE 1 TABLET BY MOUTH EVERYDAY AT BEDTIME   levofloxacin (LEVAQUIN) 500 MG tablet  Take 1 tablet (500 mg total) by mouth daily.   meloxicam (MOBIC) 15 MG tablet TAKE 1 TABLET BY MOUTH EVERY DAY   metoprolol succinate (TOPROL-XL) 25 MG 24 hr tablet TAKE 1 TABLET (25 MG TOTAL) BY MOUTH DAILY.   montelukast (SINGULAIR) 10 MG tablet TAKE 1 TABLET BY MOUTH EVERY DAY   promethazine (PHENERGAN) 12.5 MG tablet Take 1 tablet (12.5 mg total) by mouth every 8 (eight) hours as needed (for cough).   PROMETHAZINE-PHENYLEPHRINE 6.25-5 MG/5ML syrup TAKE EVERY 4-6 HOURS AS NEEDED FOR COUGH.   TALTZ 80 MG/ML SOAJ Inject into the skin.   tirzepatide (MOUNJARO) 2.5 MG/0.5ML Pen INJECT 2.5 MG SUBCUTANEOUSLY WEEKLY   Vibegron (GEMTESA) 75 MG TABS Take 1 tablet (75 mg total) by mouth daily.   Vitamin D, Ergocalciferol, (DRISDOL) 1.25 MG (50000 UNIT) CAPS capsule Take 1 capsule (50,000 Units total) by mouth every 7 (seven) days.   No facility-administered medications prior to visit.    Review of Systems  Constitutional: Negative.  Negative for fever.  HENT: Negative.  Negative for sore throat.   Eyes: Negative.   Respiratory: Negative.  Negative for shortness of breath.   Cardiovascular:  Negative.  Negative for chest pain.  Gastrointestinal: Negative.  Negative for abdominal pain, constipation, diarrhea and vomiting.  Genitourinary: Negative.   Musculoskeletal:  Negative for joint pain and myalgias.  Skin:  Positive for itching and rash.  Neurological: Negative.  Negative for dizziness and headaches.  Endo/Heme/Allergies: Negative.   All other systems reviewed and are negative.      Objective:   BP 112/74   Pulse 61   Ht 5\' 4"  (1.626 m)   Wt 295 lb (133.8 kg)   SpO2 97%   BMI 50.64 kg/m   Vitals:   05/04/23 1118  BP: 112/74  Pulse: 61  Height: 5\' 4"  (1.626 m)  Weight: 295 lb (133.8 kg)  SpO2: 97%  BMI (Calculated): 50.61    Physical Exam Vitals and nursing note reviewed.  Constitutional:      Appearance: Normal appearance. She is normal weight.  HENT:     Head: Normocephalic and atraumatic.      Nose: Nose normal.     Mouth/Throat:     Mouth: Mucous membranes are moist.  Eyes:     Extraocular Movements: Extraocular movements intact.     Conjunctiva/sclera: Conjunctivae normal.     Pupils: Pupils are equal, round, and reactive to light.  Cardiovascular:     Rate and Rhythm: Normal rate and regular rhythm.     Pulses: Normal pulses.     Heart sounds: Normal heart sounds.  Pulmonary:     Effort: Pulmonary effort is normal.     Breath sounds: Normal breath sounds.  Abdominal:     General: Abdomen is flat. Bowel sounds are normal.     Palpations: Abdomen is soft.  Musculoskeletal:        General: Normal range of motion.     Cervical back: Normal range of motion.  Skin:    General: Skin is warm and dry.  Neurological:     General: No focal deficit present.     Mental Status: She is alert and oriented to person, place, and time.  Psychiatric:        Mood and Affect: Mood normal.        Behavior: Behavior normal.        Thought Content: Thought content normal.        Judgment: Judgment normal.  No results found for any visits on  05/04/23.  Recent Results (from the past 2160 hours)  POCT XPERT XPRESS SARS COVID-2/FLU/RSV [[id number]]     Status: Normal   Collection Time: 02/23/23  4:30 PM  Result Value Ref Range   SARS Coronavirus 2 negative    FLU A negative    FLU B negative    RSV RNA, PCR negative       Assessment & Plan:  Keflex Medrol dose pack  Problem List Items Addressed This Visit       Musculoskeletal and Integument   Irritant contact dermatitis - Primary    Return in about 1 week (around 05/11/2023) for with Marchelle Folks.   Total time spent: 25 minutes  [employer], NP  05/04/2023   This document [place] have been prepared by Dragon Voice Recognition software and as such [place] include unintentional dictation errors.

## 2023-05-08 ENCOUNTER — Encounter: Payer: Self-pay | Admitting: Cardiovascular Disease

## 2023-05-08 ENCOUNTER — Ambulatory Visit (INDEPENDENT_AMBULATORY_CARE_PROVIDER_SITE_OTHER): Payer: Medicare HMO | Admitting: Cardiovascular Disease

## 2023-05-08 VITALS — BP 132/76 | HR 52 | Ht 64.0 in | Wt 301.6 lb

## 2023-05-08 DIAGNOSIS — I25118 Atherosclerotic heart disease of native coronary artery with other forms of angina pectoris: Secondary | ICD-10-CM

## 2023-05-08 DIAGNOSIS — G4733 Obstructive sleep apnea (adult) (pediatric): Secondary | ICD-10-CM

## 2023-05-08 DIAGNOSIS — I10 Essential (primary) hypertension: Secondary | ICD-10-CM

## 2023-05-08 DIAGNOSIS — I4891 Unspecified atrial fibrillation: Secondary | ICD-10-CM | POA: Diagnosis not present

## 2023-05-08 NOTE — Progress Notes (Signed)
Cardiology Office Note   Date:  05/08/2023   ID:  Amayiah Gosnell Bounds, DOB 1946-01-18, MRN [id number]  PCP:  Miki Kins, FNP  Cardiologist:  Adrian Blackwater, MD      History of Present Illness: Patricia Walker is a 78 y.o. female who presents for  Chief Complaint  Patient presents with   Results    ECHO NST results    Has been feeling tired, says Zeck be due to having COVID 2 times      Past Medical History:  Diagnosis Date   Anxiety    Arthritis    Asthma    COPD exacerbation (HCC) 10/08/2020   Coronary artery disease    mild, nonobstructive   Depression    Dyspnea    on exertion   Dysrhythmia    Atrial Fibrillation   GERD (gastroesophageal reflux disease)    Heart murmur    Hyperlipidemia    Hypertension    Hypothyroidism    MI, old 2016   nonobstructive CAD by Cath   Morbid obesity (HCC)    Persistent atrial fibrillation (HCC)    Pneumonia 06/2018   and RSV   Psoriasis    Sleep apnea    compliant with CPAP   Thyroid disease      Past Surgical History:  Procedure Laterality Date   ARTERY BIOPSY Right 07/21/2017   Procedure: BIOPSY TEMPORAL ARTERY;  Surgeon: Renford Dills, MD;  Location: [facility];  Service: Vascular;  Laterality: Right;   CARDIAC CATHETERIZATION     CARDIOVERSION Right 09/01/2016   Procedure: Cardioversion;  Surgeon: Laurier Nancy, MD;  Location: [facility];  Service: Cardiovascular;  Laterality: Right;   CARDIOVERSION N/A 09/09/2016   Procedure: Cardioversion;  Surgeon: Laurier Nancy, MD;  Location: [facility];  Service: Cardiovascular;  Laterality: N/A;   CATARACT EXTRACTION W/PHACO Right 03/26/2019   Procedure: CATARACT EXTRACTION PHACO AND INTRAOCULAR LENS PLACEMENT (IOC) RIGHT 6.45, 00:39.9;  Surgeon: Galen Manila, MD;  Location: [facility];  Service: Ophthalmology;  Laterality: Right;   CATARACT EXTRACTION W/PHACO Left 04/16/2019   Procedure: CATARACT EXTRACTION PHACO AND INTRAOCULAR LENS PLACEMENT (IOC) LEFT;    3.14, 00:24.9;  Surgeon: Galen Manila, MD;  Location: [facility];  Service: Ophthalmology;  Laterality: Left;  sleep apnea-CPAP   COLONOSCOPY WITH PROPOFOL N/A 08/28/2019   Procedure: COLONOSCOPY WITH PROPOFOL;  Surgeon: Toledo, Boykin Nearing, MD;  Location: [facility];  Service: Gastroenterology;  Laterality: N/A;   ELECTROPHYSIOLOGIC STUDY N/A 02/01/2016   Procedure: CARDIOVERSION;  Surgeon: Laurier Nancy, MD;  Location: [facility];  Service: Cardiovascular;  Laterality: N/A;   ESOPHAGEAL DILATION     ESOPHAGOGASTRODUODENOSCOPY (EGD) WITH PROPOFOL N/A 02/06/2017   Procedure: ESOPHAGOGASTRODUODENOSCOPY (EGD) WITH PROPOFOL;  Surgeon: Wyline Mood, MD;  Location[facility];  Service: Gastroenterology;  Laterality: N/A;   ESOPHAGOGASTRODUODENOSCOPY (EGD) WITH PROPOFOL N/A 08/28/2019   Procedure: ESOPHAGOGASTRODUODENOSCOPY (EGD) WITH PROPOFOL;  Surgeon: Toledo, Boykin Nearing, MD;  Location: [facility];  Service: Gastroenterology;  Laterality: N/A;   EUS N/A 02/16/2017   Procedure: FULL UPPER ENDOSCOPIC ULTRASOUND (EUS) RADIAL;  Surgeon: Doren Custard, MD;  Location: [facility];  Service: Gastroenterology;  Laterality: N/A;   LEFT HEART CATH AND CORONARY ANGIOGRAPHY N/A 09/08/2016   Procedure: Left Heart Cath and Coronary Angiography;  Surgeon: Laurier Nancy, MD;  Location: [facility];  Service: Cardiovascular;  Laterality: N/A;   US ECHOCARDIOGRAPHY       Current Outpatient Medications  Medication Sig  Dispense Refill   albuterol (VENTOLIN HFA) 108 (90 Base) MCG/ACT inhaler INHALE 1-2 PUFFS EVERY 4-6 HOURS AS NEEDED FOR SHORTNESS OF BREATH 18 each 6   atorvastatin (LIPITOR) 40 MG tablet ONCE A DAY FOR CHOLESTEROL 90 tablet 3   azelastine (ASTELIN) 0.1 % nasal spray Place 2 sprays into both nostrils 2 (two) times daily as needed for rhinitis or allergies.      baclofen (LIORESAL) 10 MG tablet TAKE 1 TABLET BY MOUTH TWICE DAILY AS NEEDED FOR BACK SPASM. 180 tablet 1    budesonide-formoterol (SYMBICORT) 160-4.5 MCG/ACT inhaler Inhale 2 puffs into the lungs 2 (two) times daily.     cephALEXin (KEFLEX) 500 MG capsule Take 1 capsule (500 mg total) by mouth 4 (four) times daily for 10 days. 40 capsule 0   chlorpheniramine-HYDROcodone (TUSSIONEX) 10-8 MG/5ML Take 5 mLs by mouth every 12 (twelve) hours as needed for cough. 70 mL 0   diazepam (VALIUM) 2 MG tablet Take 1 tablet (2 mg total) by mouth every 8 (eight) hours as needed for muscle spasms. 15 tablet 0   ELIQUIS 5 MG TABS tablet TAKE 1 TABLET BY MOUTH TWICE A DAY 180 tablet 1   esomeprazole (NEXIUM) 40 MG capsule TAKE 1 CAPSULE BY MOUTH EVERY DAY IN THE MORNING 90 capsule 3   gabapentin (NEURONTIN) 300 MG capsule Take 300 mg by mouth 2 (two) times daily.     HYDROcodone-acetaminophen (NORCO) 5-325 MG tablet Take 1 tablet by mouth every 6 (six) hours as needed for moderate pain. 15 tablet 0   isosorbide mononitrate (IMDUR) 30 MG 24 hr tablet TAKE 1 TABLET BY MOUTH EVERYDAY AT BEDTIME 90 tablet 1   levofloxacin (LEVAQUIN) 500 MG tablet Take 1 tablet (500 mg total) by mouth daily. 7 tablet 0   meloxicam (MOBIC) 15 MG tablet TAKE 1 TABLET BY MOUTH EVERY DAY 90 tablet 2   methylPREDNISolone (MEDROL DOSEPAK) 4 MG TBPK tablet As directed 1 each 0   metoprolol succinate (TOPROL-XL) 25 MG 24 hr tablet TAKE 1 TABLET (25 MG TOTAL) BY MOUTH DAILY. 90 tablet 1   montelukast (SINGULAIR) 10 MG tablet TAKE 1 TABLET BY MOUTH EVERY DAY 90 tablet 3   promethazine (PHENERGAN) 12.5 MG tablet Take 1 tablet (12.5 mg total) by mouth every 8 (eight) hours as needed (for cough). 30 tablet 0   PROMETHAZINE-PHENYLEPHRINE 6.25-5 MG/5ML syrup TAKE EVERY 4-6 HOURS AS NEEDED FOR COUGH. 120 mL 3   TALTZ 80 MG/ML SOAJ Inject into the skin.     tirzepatide (MOUNJARO) 2.5 MG/0.5ML Pen INJECT 2.5 MG SUBCUTANEOUSLY WEEKLY 2 mL 1   Vibegron (GEMTESA) 75 MG TABS Take 1 tablet (75 mg total) by mouth daily. 30 tablet 5   Vitamin D,  Ergocalciferol, (DRISDOL) 1.25 MG (50000 UNIT) CAPS capsule Take 1 capsule (50,000 Units total) by mouth every 7 (seven) days. 12 capsule 1   No current facility-administered medications for this visit.    Allergies:   Codeine and Other    Social History:   reports that she quit smoking about 10 years ago. Her smoking use included cigarettes. She has never used smokeless tobacco. She reports that she does not drink alcohol and does not use drugs.   Family History:  family history includes Breast cancer in her paternal aunt; Heart attack in her father; Other in her father.    ROS:     Review of Systems  Constitutional: Negative.   HENT: Negative.    Eyes: Negative.   Respiratory:  Negative.    Gastrointestinal: Negative.   Genitourinary: Negative.   Musculoskeletal: Negative.   Skin: Negative.   Neurological: Negative.   Endo/Heme/Allergies: Negative.   Psychiatric/Behavioral: Negative.    All other systems reviewed and are negative.     All other systems are reviewed and negative.    PHYSICAL EXAM: VS:  BP 132/76   Pulse (!) 52   Ht 5\' 4"  (1.626 m)   Wt (!) 301 lb 9.6 oz (136.8 kg)   SpO2 96%   BMI 51.77 kg/m  , BMI Body mass index is 51.77 kg/m. Last weight:  Wt Readings from Last 3 Encounters:  05/08/23 (!) 301 lb 9.6 oz (136.8 kg)  05/04/23 295 lb (133.8 kg)  02/23/23 296 lb (134.3 kg)     Physical Exam Constitutional:      Appearance: Normal appearance.  Cardiovascular:     Rate and Rhythm: Normal rate and regular rhythm.     Heart sounds: Normal heart sounds.  Pulmonary:     Effort: Pulmonary effort is normal.     Breath sounds: Normal breath sounds.  Musculoskeletal:     Right lower leg: No edema.     Left lower leg: No edema.  Neurological:     Mental Status: She is alert.       EKG:   Recent Labs: 11/01/2022: TSH 4.840 02/02/2023: ALT 17; BUN 24; Creatinine, Ser 1.00; Hemoglobin 14.7; Platelets 350; Potassium 4.2; Sodium 135    Lipid  Panel    Component Value Date/Time   CHOL 215 (H) 11/01/2022 1035   CHOL 190 05/18/2012 0928   TRIG 153 (H) 11/01/2022 1035   TRIG 138 05/18/2012 0928   HDL 41 11/01/2022 1035   HDL 35 (L) 05/18/2012 0928   CHOLHDL 5.2 (H) 11/01/2022 1035   VLDL 28 05/18/2012 0928   LDLCALC 146 (H) 11/01/2022 1035   LDLCALC 127 (H) 05/18/2012 4098      Other studies Reviewed: Additional studies/ records that were reviewed today include:  Review of the above records demonstrates:       No data to display            ASSESSMENT AND PLAN:    ICD-10-CM   1. Atrial fibrillation, unspecified type (HCC)  I48.91     2. Coronary artery disease of native artery of native heart with stable angina pectoris (HCC)  I25.118    stress test was normal and normal LVEF    3. Primary hypertension  I10     4. Obstructive sleep apnea syndrome  G47.33    Has to wear cpap as not being compliant as cause of fatigue and tiredness.       Problem List Items Addressed This Visit       Cardiovascular and Mediastinum   Atrial fibrillation (HCC) - Primary   Coronary artery disease of native artery of native heart with stable angina pectoris (HCC)   Hypertension     Respiratory   Obstructive sleep apnea syndrome       Disposition:   Return in about 1 month (around 06/08/2023).    Total time spent: 30 minutes  Signed,  Adrian Blackwater, MD  05/08/2023 11:49 AM    [facility]

## 2023-05-11 ENCOUNTER — Ambulatory Visit: Payer: Medicare HMO | Admitting: Family

## 2023-06-05 ENCOUNTER — Ambulatory Visit (INDEPENDENT_AMBULATORY_CARE_PROVIDER_SITE_OTHER): Payer: 59 | Admitting: Family

## 2023-06-05 ENCOUNTER — Encounter: Payer: Self-pay | Admitting: Family

## 2023-06-05 VITALS — BP 110/74 | HR 67 | Ht 64.0 in | Wt 303.0 lb

## 2023-06-05 DIAGNOSIS — Z0130 Encounter for examination of blood pressure without abnormal findings: Secondary | ICD-10-CM

## 2023-06-05 DIAGNOSIS — R21 Rash and other nonspecific skin eruption: Secondary | ICD-10-CM | POA: Diagnosis not present

## 2023-06-06 DIAGNOSIS — L409 Psoriasis, unspecified: Principal | ICD-10-CM

## 2023-06-06 MED ORDER — TRIAMCINOLONE ACETONIDE 0.1 % EX CREA: 1.0000 | TOPICAL_CREAM | Freq: Two times a day (BID) | CUTANEOUS | 0 refills | Status: DC

## 2023-06-06 NOTE — Unmapped (Addendum)
 [facility] Refill Coordination Note    Specialty Medication(s) to be Shipped:   Inflammatory Disorders: Taltz    Other medication(s) to be shipped: No additional medications requested for fill at this time     Lisa Carlson, DOB: Tumblin 20, 1947  Phone: [phone] (home)       All above HIPAA information was verified with patient.     Was a Nurse, learning disability used for this call? No    Completed refill call assessment today to schedule patient's medication shipment from the [facility] [phone]).  All relevant notes have been reviewed.     Specialty medication(s) and dose(s) confirmed: Regimen is correct and unchanged.   Changes to medications: Lisa Carlson reports no changes at this time.  Changes to insurance: Yes: [employer] updated in [place]  New side effects reported not previously addressed with a pharmacist or physician: None reported  Questions for the pharmacist: No    Confirmed patient received a Conservation officer, historic buildings and a Surveyor, mining with first shipment. The patient will receive a drug information handout for each medication shipped and additional FDA Medication Guides as required.       DISEASE/MEDICATION-SPECIFIC INFORMATION        For patients on injectable medications: Patient currently has 0 doses left.  Next injection is scheduled for asap, Patient's last dose was due around 2/20. Patient will take missed dose upon delivery.    SPECIALTY MEDICATION ADHERENCE     Medication Adherence    Patient reported X missed doses in the last month: 1  Specialty Medication: ixekizumab (TALTZ AUTOINJECTOR) 80 mg/mL AtIn auto-injector  Patient is on additional specialty medications: No  Informant: patient              Were doses missed due to medication being on hold? No    TALTZ AUTOINJECTOR 80   mg/ml: 0 days of medicine on hand       REFERRAL TO PHARMACIST     Referral to the pharmacist: Yes - routine compliance concerns. Patient has missed 1-3 doses of medication. Refills were scheduled and concern routed to pharmacist for evaluation.      SHIPPING     Shipping address confirmed in Epic.       Delivery Scheduled: Yes, Expected medication delivery date: 2/28.     Medication will be delivered via Same Day Courier to the prescription address in Epic W[facility].    [name]   [facility] Specialty Technician

## 2023-06-07 NOTE — Unmapped (Signed)
 This pharmacist was notified by a technician that this patient has reported that they've missed 1 doses of their Taltz.. I have reviewed the patient's medical record and have determined that no further pharmacist action is needed. Medication delayed a few days, was due 2/20. Appears pharmacy was waiting to hear back from patient after 2/5 contact.      Approximate time spent: 0-5 minutes    Lanney Gins, Clinical Specialty Pharmacist  [facility]

## 2023-06-09 MED FILL — TALTZ AUTOINJECTOR 80 MG/ML SUBCUTANEOUS: SUBCUTANEOUS | 28 days supply | Qty: 1 | Fill #8

## 2023-06-10 DIAGNOSIS — J45909 Unspecified asthma, uncomplicated: Secondary | ICD-10-CM | POA: Diagnosis not present

## 2023-06-15 ENCOUNTER — Telehealth: Payer: Self-pay

## 2023-06-15 NOTE — Telephone Encounter (Signed)
 Patient LM stating that her mouth and gum are sore, she ate some nuts and her dentures werent in good and caused her mouth to be sore. She spelled the name of a medication but I'm not sure I heard it clearly sounded like she spelled Charchaine, not sure if she meant Chlorhexidine  instead please advise

## 2023-06-19 ENCOUNTER — Other Ambulatory Visit: Payer: Self-pay | Admitting: Family

## 2023-06-19 DIAGNOSIS — M5440 Lumbago with sciatica, unspecified side: Secondary | ICD-10-CM

## 2023-06-23 ENCOUNTER — Other Ambulatory Visit: Payer: Self-pay

## 2023-06-23 MED ORDER — CHLORHEXIDINE GLUCONATE 0.12 % MT SOLN: 15.0000 mL | Freq: Two times a day (BID) | OROMUCOSAL | 0 refills | Status: DC

## 2023-06-24 ENCOUNTER — Other Ambulatory Visit: Payer: Self-pay | Admitting: Family

## 2023-06-26 ENCOUNTER — Ambulatory Visit: Admitting: Family

## 2023-06-30 NOTE — Unmapped (Signed)
 [facility] Refill Coordination Note    Specialty Medication(s) to be Shipped:   Inflammatory Disorders: Taltz    Other medication(s) to be shipped: No additional medications requested for fill at this time     Lisa Carlson, DOB: January 18, 1946  Phone: [phone] (home)       All above HIPAA information was verified with patient.     Was a Nurse, learning disability used for this call? No    Completed refill call assessment today to schedule patient's medication shipment from the [facility] [phone]).  All relevant notes have been reviewed.     Specialty medication(s) and dose(s) confirmed: Regimen is correct and unchanged.   Changes to medications: Lisa Carlson reports no changes at this time.  Changes to insurance: Yes: [employer] updated in [name]  New side effects reported not previously addressed with a pharmacist or physician: None reported  Questions for the pharmacist: No    Confirmed patient received a Conservation officer, historic buildings and a Surveyor, mining with first shipment. The patient will receive a drug information handout for each medication shipped and additional FDA Medication Guides as required.       DISEASE/MEDICATION-SPECIFIC INFORMATION        For patients on injectable medications: Patient currently has 0 doses left.  Next injection is scheduled for 3/28.     SPECIALTY MEDICATION ADHERENCE     Medication Adherence    Patient reported X missed doses in the last month: 0  Specialty Medication: ixekizumab (TALTZ AUTOINJECTOR) 80 mg/mL AtIn auto-injector  Patient is on additional specialty medications: No  Informant: patient              Were doses missed due to medication being on hold? No    TALTZ AUTOINJECTOR 80   mg/ml: 0 days of medicine on hand       REFERRAL TO PHARMACIST     Referral to the pharmacist: Not needed      [facility]     Shipping address confirmed in Epic.     Cost and Payment: Patient has a $0 copay, payment information is not required.    Delivery Scheduled: Yes, Expected medication delivery date: 3/25.     Medication will be delivered via Same Day Courier to the prescription address in Epic WAM.    [facility]isa [facility]

## 2023-07-03 ENCOUNTER — Other Ambulatory Visit: Payer: Self-pay

## 2023-07-03 ENCOUNTER — Ambulatory Visit: Admitting: Family

## 2023-07-04 MED FILL — TALTZ AUTOINJECTOR 80 MG/ML SUBCUTANEOUS: SUBCUTANEOUS | 28 days supply | Qty: 1 | Fill #9

## 2023-07-05 MED ORDER — HYDROCOD POLI-CHLORPHE POLI ER 10-8 MG/5ML PO SUER
5.0000 mL | Freq: Two times a day (BID) | ORAL | 0 refills | Status: DC | PRN
Start: 2023-07-05 — End: 2023-08-05

## 2023-07-11 ENCOUNTER — Other Ambulatory Visit: Payer: Self-pay

## 2023-07-11 DIAGNOSIS — J45909 Unspecified asthma, uncomplicated: Secondary | ICD-10-CM | POA: Diagnosis not present

## 2023-07-12 MED ORDER — NYSTATIN 100000 UNIT/GM EX POWD: 1.0000 | Freq: Three times a day (TID) | CUTANEOUS | 1 refills | Status: AC | PRN

## 2023-07-13 NOTE — Unmapped (Signed)
 Received fax stating PA for Lisa Carlson has been approved and is good until 04/10/2024. Approval letter to be scanned into patients chart. Pt was informed via MyChart.

## 2023-07-14 DIAGNOSIS — H02889 Meibomian gland dysfunction of unspecified eye, unspecified eyelid: Secondary | ICD-10-CM | POA: Diagnosis not present

## 2023-07-14 DIAGNOSIS — M3501 Sicca syndrome with keratoconjunctivitis: Secondary | ICD-10-CM | POA: Diagnosis not present

## 2023-07-14 DIAGNOSIS — H04223 Epiphora due to insufficient drainage, bilateral lacrimal glands: Secondary | ICD-10-CM | POA: Diagnosis not present

## 2023-08-01 ENCOUNTER — Other Ambulatory Visit: Payer: Self-pay | Admitting: Family

## 2023-08-01 DIAGNOSIS — I2510 Atherosclerotic heart disease of native coronary artery without angina pectoris: Secondary | ICD-10-CM

## 2023-08-01 DIAGNOSIS — I4891 Unspecified atrial fibrillation: Secondary | ICD-10-CM

## 2023-08-04 ENCOUNTER — Encounter: Payer: Self-pay | Admitting: Family

## 2023-08-05 ENCOUNTER — Observation Stay
Admission: EM | Admit: 2023-08-05 | Discharge: 2023-08-07 | Disposition: A | Discharge status: Still In Hospital | Attending: Internal Medicine | Admitting: Internal Medicine

## 2023-08-05 ENCOUNTER — Other Ambulatory Visit: Payer: Self-pay

## 2023-08-05 ENCOUNTER — Emergency Department

## 2023-08-05 DIAGNOSIS — E039 Hypothyroidism, unspecified: Secondary | ICD-10-CM | POA: Diagnosis not present

## 2023-08-05 DIAGNOSIS — K219 Gastro-esophageal reflux disease without esophagitis: Secondary | ICD-10-CM | POA: Diagnosis present

## 2023-08-05 DIAGNOSIS — I10 Essential (primary) hypertension: Secondary | ICD-10-CM | POA: Diagnosis present

## 2023-08-05 DIAGNOSIS — J441 Chronic obstructive pulmonary disease with (acute) exacerbation: Secondary | ICD-10-CM | POA: Diagnosis not present

## 2023-08-05 DIAGNOSIS — R0789 Other chest pain: Secondary | ICD-10-CM | POA: Diagnosis not present

## 2023-08-05 DIAGNOSIS — I700 Atherosclerosis of aorta: Secondary | ICD-10-CM | POA: Insufficient documentation

## 2023-08-05 DIAGNOSIS — J45909 Unspecified asthma, uncomplicated: Secondary | ICD-10-CM | POA: Diagnosis not present

## 2023-08-05 DIAGNOSIS — I480 Paroxysmal atrial fibrillation: Secondary | ICD-10-CM | POA: Diagnosis not present

## 2023-08-05 DIAGNOSIS — Z79899 Other long term (current) drug therapy: Secondary | ICD-10-CM | POA: Insufficient documentation

## 2023-08-05 DIAGNOSIS — I4891 Unspecified atrial fibrillation: Secondary | ICD-10-CM | POA: Diagnosis not present

## 2023-08-05 DIAGNOSIS — R059 Cough, unspecified: Secondary | ICD-10-CM | POA: Diagnosis not present

## 2023-08-05 DIAGNOSIS — I5032 Chronic diastolic (congestive) heart failure: Secondary | ICD-10-CM | POA: Insufficient documentation

## 2023-08-05 DIAGNOSIS — Z7901 Long term (current) use of anticoagulants: Secondary | ICD-10-CM | POA: Insufficient documentation

## 2023-08-05 DIAGNOSIS — I2510 Atherosclerotic heart disease of native coronary artery without angina pectoris: Secondary | ICD-10-CM | POA: Diagnosis not present

## 2023-08-05 DIAGNOSIS — I110 Hypertensive heart disease with heart failure: Secondary | ICD-10-CM | POA: Diagnosis not present

## 2023-08-05 DIAGNOSIS — Z87891 Personal history of nicotine dependence: Secondary | ICD-10-CM | POA: Diagnosis not present

## 2023-08-05 DIAGNOSIS — I25118 Atherosclerotic heart disease of native coronary artery with other forms of angina pectoris: Secondary | ICD-10-CM | POA: Diagnosis not present

## 2023-08-05 DIAGNOSIS — E785 Hyperlipidemia, unspecified: Secondary | ICD-10-CM | POA: Diagnosis not present

## 2023-08-05 DIAGNOSIS — I5033 Acute on chronic diastolic (congestive) heart failure: Secondary | ICD-10-CM | POA: Insufficient documentation

## 2023-08-05 DIAGNOSIS — G4733 Obstructive sleep apnea (adult) (pediatric): Secondary | ICD-10-CM | POA: Diagnosis not present

## 2023-08-05 DIAGNOSIS — R079 Chest pain, unspecified: Secondary | ICD-10-CM | POA: Diagnosis not present

## 2023-08-05 DIAGNOSIS — Z7982 Long term (current) use of aspirin: Secondary | ICD-10-CM | POA: Insufficient documentation

## 2023-08-05 DIAGNOSIS — R0602 Shortness of breath: Secondary | ICD-10-CM | POA: Diagnosis not present

## 2023-08-05 LAB — RESP PANEL BY RT-PCR (RSV, FLU A&B, COVID)  RVPGX2
Influenza A by PCR: NEGATIVE
Influenza B by PCR: NEGATIVE
Resp Syncytial Virus by PCR: NEGATIVE
SARS Coronavirus 2 by RT PCR: NEGATIVE

## 2023-08-05 LAB — CBC
HCT: 42 % (ref 36.0–46.0)
Hemoglobin: 14 g/dL (ref 12.0–15.0)
MCH: 28.7 pg (ref 26.0–34.0)
MCHC: 33.3 g/dL (ref 30.0–36.0)
MCV: 86.1 fL (ref 80.0–100.0)
Platelets: 269 10*3/uL (ref 150–400)
RBC: 4.88 MIL/uL (ref 3.87–5.11)
RDW: 12.9 % (ref 11.5–15.5)
WBC: 8.1 10*3/uL (ref 4.0–10.5)
nRBC: 0 % (ref 0.0–0.2)

## 2023-08-05 LAB — BASIC METABOLIC PANEL WITH GFR
Anion gap: 10 (ref 5–15)
BUN: 13 mg/dL (ref 8–23)
CO2: 25 mmol/L (ref 22–32)
Calcium: 8.3 mg/dL — ABNORMAL LOW (ref 8.9–10.3)
Chloride: 102 mmol/L (ref 98–111)
Creatinine, Ser: 0.96 mg/dL (ref 0.44–1.00)
GFR, Estimated: >60 mL/min (ref >60)
Glucose, Bld: 122 mg/dL — ABNORMAL HIGH (ref 70–99)
Potassium: 3.9 mmol/L (ref 3.5–5.1)
Sodium: 137 mmol/L (ref 135–145)

## 2023-08-05 LAB — BRAIN NATRIURETIC PEPTIDE: B Natriuretic Peptide: 463.7 pg/mL — ABNORMAL HIGH (ref 0.0–100.0)

## 2023-08-05 LAB — TROPONIN I (HIGH SENSITIVITY)
Troponin I (High Sensitivity): 19 ng/L — ABNORMAL HIGH (ref <18)
Troponin I (High Sensitivity): 18 ng/L — ABNORMAL HIGH (ref <18)

## 2023-08-05 MED ORDER — SODIUM CHLORIDE 0.9% FLUSH
3.0000 mL | Freq: Two times a day (BID) | INTRAVENOUS | Status: DC
  Administered 2023-08-05 – 2023-08-06 (×4): 3 mL via INTRAVENOUS

## 2023-08-05 MED ORDER — IPRATROPIUM-ALBUTEROL 0.5-2.5 (3) MG/3ML IN SOLN
6.0000 mL | Freq: Once | RESPIRATORY_TRACT | Status: AC
  Administered 2023-08-05: 6 mL via RESPIRATORY_TRACT
  Filled 2023-08-05: qty 3

## 2023-08-05 MED ORDER — ACETAMINOPHEN 325 MG PO TABS: 650.0000 mg | ORAL_TABLET | Freq: Four times a day (QID) | ORAL | Status: DC | PRN

## 2023-08-05 MED ORDER — GABAPENTIN 300 MG PO CAPS
300.0000 mg | ORAL_CAPSULE | Freq: Every day | ORAL | Status: DC
  Administered 2023-08-05 – 2023-08-07 (×3): 300 mg via ORAL
  Filled 2023-08-05 (×3): qty 1

## 2023-08-05 MED ORDER — APIXABAN 5 MG PO TABS
5.0000 mg | ORAL_TABLET | Freq: Two times a day (BID) | ORAL | Status: DC
  Administered 2023-08-05 – 2023-08-07 (×5): 5 mg via ORAL
  Filled 2023-08-05 (×5): qty 1

## 2023-08-05 MED ORDER — ONDANSETRON HCL 4 MG PO TABS
4.0000 mg | ORAL_TABLET | Freq: Four times a day (QID) | ORAL | Status: DC | PRN
Start: 2023-08-05 — End: 2023-08-07

## 2023-08-05 MED ORDER — ACETAMINOPHEN 325 MG PO TABS
650.0000 mg | ORAL_TABLET | Freq: Four times a day (QID) | ORAL | Status: DC | PRN
  Administered 2023-08-05 – 2023-08-06 (×3): 650 mg via ORAL
  Filled 2023-08-05 (×3): qty 2

## 2023-08-05 MED ORDER — ENOXAPARIN SODIUM 80 MG/0.8ML IJ SOSY: 0.5000 mg/kg | PREFILLED_SYRINGE | INTRAMUSCULAR | Status: DC

## 2023-08-05 MED ORDER — IPRATROPIUM-ALBUTEROL 0.5-2.5 (3) MG/3ML IN SOLN
6.0000 mL | Freq: Once | RESPIRATORY_TRACT | Status: AC
  Administered 2023-08-05: 6 mL via RESPIRATORY_TRACT
  Filled 2023-08-05: qty 6

## 2023-08-05 MED ORDER — ATORVASTATIN CALCIUM 20 MG PO TABS
40.0000 mg | ORAL_TABLET | Freq: Every day | ORAL | Status: DC
  Administered 2023-08-05 – 2023-08-07 (×3): 40 mg via ORAL
  Filled 2023-08-05 (×3): qty 2

## 2023-08-05 MED ORDER — SODIUM CHLORIDE 0.9 % IV SOLN: 250.0000 mL | INTRAVENOUS | Status: AC | PRN

## 2023-08-05 MED ORDER — PANTOPRAZOLE SODIUM 40 MG PO TBEC
40.0000 mg | DELAYED_RELEASE_TABLET | Freq: Every day | ORAL | Status: DC
  Administered 2023-08-05 – 2023-08-07 (×3): 40 mg via ORAL
  Filled 2023-08-05 (×3): qty 1

## 2023-08-05 MED ORDER — SODIUM CHLORIDE 0.9% FLUSH: 3.0000 mL | INTRAVENOUS | Status: DC | PRN

## 2023-08-05 MED ORDER — AZITHROMYCIN 500 MG PO TABS
500.0000 mg | ORAL_TABLET | Freq: Every day | ORAL | Status: AC
  Administered 2023-08-06 – 2023-08-07 (×2): 500 mg via ORAL
  Filled 2023-08-05 (×2): qty 1

## 2023-08-05 MED ORDER — FUROSEMIDE 10 MG/ML IJ SOLN
40.0000 mg | Freq: Two times a day (BID) | INTRAMUSCULAR | Status: DC
  Administered 2023-08-05 – 2023-08-06 (×4): 40 mg via INTRAVENOUS
  Filled 2023-08-05 (×4): qty 4

## 2023-08-05 MED ORDER — IPRATROPIUM-ALBUTEROL 0.5-2.5 (3) MG/3ML IN SOLN
3.0000 mL | Freq: Four times a day (QID) | RESPIRATORY_TRACT | Status: DC
  Administered 2023-08-05 – 2023-08-07 (×6): 3 mL via RESPIRATORY_TRACT
  Filled 2023-08-05 (×7): qty 3

## 2023-08-05 MED ORDER — METHYLPREDNISOLONE SODIUM SUCC 125 MG IJ SOLR
125.0000 mg | Freq: Once | INTRAMUSCULAR | Status: AC
  Administered 2023-08-05: 125 mg via INTRAVENOUS
  Filled 2023-08-05: qty 2

## 2023-08-05 MED ORDER — SODIUM CHLORIDE 0.9 % IV SOLN
500.0000 mg | INTRAVENOUS | Status: AC
  Administered 2023-08-05: 500 mg via INTRAVENOUS
  Filled 2023-08-05: qty 5

## 2023-08-05 MED ORDER — METOPROLOL SUCCINATE ER 25 MG PO TB24
25.0000 mg | ORAL_TABLET | Freq: Every day | ORAL | Status: DC
  Administered 2023-08-05 – 2023-08-07 (×3): 25 mg via ORAL
  Filled 2023-08-05 (×3): qty 1

## 2023-08-05 MED ORDER — PREDNISONE 20 MG PO TABS
40.0000 mg | ORAL_TABLET | Freq: Every day | ORAL | Status: DC
  Administered 2023-08-07: 40 mg via ORAL
  Filled 2023-08-05: qty 2

## 2023-08-05 MED ORDER — ONDANSETRON HCL 4 MG/2ML IJ SOLN: 4.0000 mg | Freq: Four times a day (QID) | INTRAMUSCULAR | Status: DC | PRN

## 2023-08-05 MED ORDER — ALBUTEROL SULFATE (2.5 MG/3ML) 0.083% IN NEBU: 2.5000 mg | INHALATION_SOLUTION | RESPIRATORY_TRACT | Status: DC | PRN

## 2023-08-05 MED ORDER — METHYLPREDNISOLONE SODIUM SUCC 40 MG IJ SOLR
40.0000 mg | Freq: Two times a day (BID) | INTRAMUSCULAR | Status: AC
  Administered 2023-08-05 – 2023-08-06 (×2): 40 mg via INTRAVENOUS
  Filled 2023-08-05 (×2): qty 1

## 2023-08-05 MED ORDER — ACETAMINOPHEN 325 MG PO TABS
650.0000 mg | ORAL_TABLET | Freq: Once | ORAL | Status: AC
Start: 2023-08-05 — End: 2023-08-05
  Administered 2023-08-05: 650 mg via ORAL
  Filled 2023-08-05: qty 2

## 2023-08-05 NOTE — Assessment & Plan Note (Signed)
 BP stable Titrate home regimen

## 2023-08-05 NOTE — Assessment & Plan Note (Signed)
 Cont statin

## 2023-08-05 NOTE — Assessment & Plan Note (Signed)
 2D 02/2023 w/ EF 65%  Increased WOB, cough on presentation  Overlapping COPD Exacerbation  BNP 464- above baseline  CXR grossly stable  Morbid obesity- mixed volume status  IV lasix   Monitor UOP  Consult cardiology as appropriate

## 2023-08-05 NOTE — Assessment & Plan Note (Signed)
 PPI ?

## 2023-08-05 NOTE — ED Provider Notes (Signed)
 [facility] Provider Note    Event Date/Time   First MD Initiated Contact with Patient 08/05/23 [id number]     (approximate)   History   Shortness of Breath and Chest Pain   HPI  Patricia Walker is a 78 y.o. female who presents to the ED for evaluation of Shortness of Breath and Chest Pain   I reviewed clinic visit from January.  Obese patient with history of COPD, A-fib, CAD and sleep apnea.  Anticoagulated on Eliquis   Patient presents for evaluation of 1-2 days of increased cough, shortness of breath, left-sided chest discomfort.  Frequent cough and slightly increased sputum production.  Reports compliance with all of her medications and inhalers at home.  No syncope, fever, abdominal pain or emesis.   Physical Exam   Triage Vital Signs: ED Triage Vitals [08/05/23 0920]  Encounter Vitals Group     BP      Systolic BP Percentile      Diastolic BP Percentile      Pulse      Resp      Temp      Temp src      SpO2      Weight      Height      Head Circumference      Peak Flow      Pain Score 8     Pain Loc      Pain Education      Exclude from Growth Chart     Most recent vital signs: Vitals:   08/05/23 0923 08/05/23 1000  BP: 131/88 136/70  Pulse:  (!) 51  Resp:  (!) 21  Temp:    SpO2:  100%    General: Awake, no distress.  Looks uncomfortable, frequently coughing CV:  Good peripheral perfusion.  Resp:  Tachypnea to the mid 20s without distress or tripoding.  Slightly decreased airflow and wheezing throughout. Abd:  No distention.  MSK:  No deformity noted.  Neuro:  No focal deficits appreciated. Other:     ED Results / Procedures / Treatments   Labs (all labs ordered are listed, but only abnormal results are displayed) Labs Reviewed  BASIC METABOLIC PANEL WITH GFR - Abnormal; Notable for the following components:      Result Value   Glucose, Bld 122 (*)    Calcium  8.3 (*)    All other components within normal limits  BRAIN  NATRIURETIC PEPTIDE - Abnormal; Notable for the following components:   B Natriuretic Peptide 463.7 (*)    All other components within normal limits  TROPONIN I (HIGH SENSITIVITY) - Abnormal; Notable for the following components:   Troponin I (High Sensitivity) 19 (*)    All other components within normal limits  TROPONIN I (HIGH SENSITIVITY) - Abnormal; Notable for the following components:   Troponin I (High Sensitivity) 18 (*)    All other components within normal limits  RESP PANEL BY RT-PCR (RSV, FLU A&B, COVID)  [id number]  CBC    EKG A-fib with a rate of 68 bpm.  Normal axis, left bundle morphology.  No STEMI.  RADIOLOGY CXR interpreted by me without evidence of acute cardiopulmonary pathology.  Official radiology report(s): DG Chest 2 View Result Date: 08/05/2023 CLINICAL DATA:  Shortness of breath and chest pain EXAM: CHEST - 2 VIEW COMPARISON:  02/23/2023 FINDINGS: Stable cardiomediastinal contours. Aortic atherosclerotic calcifications. No pleural effusion, interstitial edema or airspace disease. The visualized osseous structures are unremarkable. IMPRESSION: No acute  cardiopulmonary disease. Aortic Atherosclerosis (ICD10-I70.0). Electronically Signed   By: Kimberley Penman M.D.   On: 08/05/2023 09:49    PROCEDURES and INTERVENTIONS:  .1-3 Lead EKG Interpretation  Performed by: Arline Bennett, MD Authorized by: Arline Bennett, MD     Interpretation: normal     ECG rate:  74   ECG rate assessment: normal     Rhythm: sinus rhythm     Ectopy: none     Conduction: normal     Medications  enoxaparin  (LOVENOX ) injection 65 mg (has no administration in time range)  sodium chloride  flush (NS) 0.9 % injection 3 mL (has no administration in time range)  sodium chloride  flush (NS) 0.9 % injection 3 mL (has no administration in time range)  0.9 %  sodium chloride  infusion (has no administration in time range)  ondansetron  (ZOFRAN ) tablet 4 mg (has no administration in time range)     Or  ondansetron  (ZOFRAN ) injection 4 mg (has no administration in time range)  acetaminophen  (TYLENOL ) tablet 650 mg (has no administration in time range)  furosemide  (LASIX ) injection 40 mg (has no administration in time range)  ipratropium-albuterol  (DUONEB) 0.5-2.5 (3) MG/3ML nebulizer solution 6 mL (6 mLs Nebulization Given 08/05/23 1006)  methylPREDNISolone  sodium succinate (SOLU-MEDROL ) 125 mg/2 mL injection 125 mg (125 mg Intravenous Given 08/05/23 1006)  acetaminophen  (TYLENOL ) tablet 650 mg (650 mg Oral Given 08/05/23 1005)  ipratropium-albuterol  (DUONEB) 0.5-2.5 (3) MG/3ML nebulizer solution 6 mL (6 mLs Nebulization Given 08/05/23 1059)     IMPRESSION / MDM / ASSESSMENT AND PLAN / ED COURSE  I reviewed the triage vital signs and the nursing notes.  Differential diagnosis includes, but is not limited to, ACS, PTX, PNA, muscle strain/spasm, PE, dissection, anxiety, pleural effusion  {Patient presents with symptoms of an acute illness or injury that is potentially life-threatening.  Patient presents with atypical chest discomfort in the setting of a COPD exacerbation requiring medical admission due to persistent symptoms despite breathing treatments and starting her on steroids.  Tachypnea improving with management, no distress or indications for BiPAP.  Clear CXR.  Low troponins are flat.  Negative viral swabs and reassuring CBC and metabolic panel.  Clear CXR.  No indication for antibiotics.  Clinical Course as of 08/05/23 1216  Sat Aug 05, 2023  1057 Reassessed.  Patient reports feeling better and she is less tachypneic.  Louder wheezing with improved airflow.  We discussed plan of care.  Will provide more breathing treatments. [DS]  1200 Consult with medicine who agrees to admit [DS]    Clinical Course User Index [DS] Arline Bennett, MD     FINAL CLINICAL IMPRESSION(S) / ED DIAGNOSES   Final diagnoses:  COPD exacerbation (HCC)  Other chest pain     Rx / DC Orders   ED  Discharge Orders     None        Note:  This document was prepared using Dragon voice recognition software and Pirie include unintentional dictation errors.   Arline Bennett, MD 08/05/23 [phone]

## 2023-08-05 NOTE — Assessment & Plan Note (Signed)
 CPAP.

## 2023-08-05 NOTE — Assessment & Plan Note (Signed)
 Noted baseline CAD  Trop in teens  No active CP  Cont home regimen including ASA, statin

## 2023-08-05 NOTE — ED Notes (Signed)
 Advised nurse that patient has ready bed

## 2023-08-05 NOTE — Assessment & Plan Note (Signed)
+   cough, wheezing, increased sputum production over 3-4 days concerning for COPD exacerbation  CXR grossly stable  IV solumedrol  Prn duonebs  IV azithromycin   Monitor

## 2023-08-05 NOTE — Assessment & Plan Note (Signed)
 Rate controlled atrial fibrillation on EKG  Cont home regimen including eliquis , metoprolol 

## 2023-08-05 NOTE — H&P (Signed)
 History and Physical    Patient: Patricia Walker UEA:[id number] DOB: [birth year] DOA: 08/05/2023 DOS: the patient was seen and examined on 08/05/2023 PCP: Trenda Frisk, FNP  Patient coming from: Home  Chief Complaint:  Chief Complaint  Patient presents with   Shortness of Breath   Chest Pain   HPI: Patricia Walker is a 78 y.o. female with medical history significant of asthma, coronary artery disease, depression, hypertension, dyslipidemia, hypothyroidism and obstructive sleep apnea on CPAP, HFpEF, COPD, CAD presenting with COPD exacerbation, acute on chronic HFpEF.  Patient reports increased work of breathing, cough, shortness of breath over the past 4 to 5 days.  No fevers or chills.  Mild chest pain.  Mild abdominal pain.  Positive orthopnea PND.  No formal diagnosis of obstructive lung disease in the past.  No active tobacco use.  Does report remote history of several year pack history.  No alcohol use.  Does report chronic meloxicam use for musculoskeletal pain.  Denies any homicidal intake.  Trace lower extremity swelling.  Follows Dr. Meredeth Stallion outpatient with cardiology.  Presented to the ER afebrile, hemodynamically stable.  Satting well on room air.  White count 8.1, hemoglobin 14, platelets 269, troponin 1918.  COVID flu and RSV negative.  Creatinine 0.96.  BNP 464.  Chest x-ray stable. Review of Systems: As mentioned in the history of present illness. All other systems reviewed and are negative. Past Medical History:  Diagnosis Date   Anxiety    Arthritis    Asthma    COPD exacerbation (HCC) 10/08/2020   Coronary artery disease    mild, nonobstructive   Depression    Dyspnea    on exertion   Dysrhythmia    Atrial Fibrillation   GERD (gastroesophageal reflux disease)    Heart murmur    Hyperlipidemia    Hypertension    Hypothyroidism    MI, old 2016   nonobstructive CAD by Cath   Morbid obesity (HCC)    Persistent atrial fibrillation (HCC)    Pneumonia 06/2018   and RSV    Psoriasis    Sleep apnea    compliant with CPAP   Thyroid  disease    Past Surgical History:  Procedure Laterality Date   ARTERY BIOPSY Right 07/21/2017   Procedure: BIOPSY TEMPORAL ARTERY;  Surgeon: Jackquelyn Mass, MD;  Location: [facility] ORS;  Service: Vascular;  Laterality: Right;   CARDIAC CATHETERIZATION     CARDIOVERSION Right 09/01/2016   Procedure: Cardioversion;  Surgeon: Cherrie Cornwall, MD;  Location: [facility];  Service: Cardiovascular;  Laterality: Right;   CARDIOVERSION N/A 09/09/2016   Procedure: Cardioversion;  Surgeon: Cherrie Cornwall, MD;  Location: [facility];  Service: Cardiovascular;  Laterality: N/A;   CATARACT EXTRACTION W/PHACO Right 03/26/2019   Procedure: CATARACT EXTRACTION PHACO AND INTRAOCULAR LENS PLACEMENT (IOC) RIGHT 6.45, 00:39.9;  Surgeon: Clair Crews, MD;  Location: [facility];  Service: Ophthalmology;  Laterality: Right;   CATARACT EXTRACTION W/PHACO Left 04/16/2019   Procedure: CATARACT EXTRACTION PHACO AND INTRAOCULAR LENS PLACEMENT (IOC) LEFT;   3.14, 00:24.9;  Surgeon: Clair Crews, MD;  Location: [facility];  Service: Ophthalmology;  Laterality: Left;  sleep apnea-CPAP   COLONOSCOPY WITH PROPOFOL  N/A 08/28/2019   Procedure: COLONOSCOPY WITH PROPOFOL ;  Surgeon: Toledo, Alphonsus Jeans, MD;  Location: [facility] ENDOSCOPY;  Service: Gastroenterology;  Laterality: N/A;   ELECTROPHYSIOLOGIC STUDY N/A 02/01/2016   Procedure: CARDIOVERSION;  Surgeon: Cherrie Cornwall, MD;  Location: [facility];  Service: Cardiovascular;  Laterality: N/A;  ESOPHAGEAL DILATION     ESOPHAGOGASTRODUODENOSCOPY (EGD) WITH PROPOFOL  N/A 02/06/2017   Procedure: ESOPHAGOGASTRODUODENOSCOPY (EGD) WITH PROPOFOL ;  Surgeon: Luke Salaam, MD;  Location: [facility];  Service: Gastroenterology;  Laterality: N/A;   ESOPHAGOGASTRODUODENOSCOPY (EGD) WITH PROPOFOL  N/A 08/28/2019   Procedure: ESOPHAGOGASTRODUODENOSCOPY (EGD) WITH PROPOFOL ;  Surgeon: Toledo, Alphonsus Jeans, MD;  Location: [facility]  ENDOSCOPY;  Service: Gastroenterology;  Laterality: N/A;   EUS N/A 02/16/2017   Procedure: FULL UPPER ENDOSCOPIC ULTRASOUND (EUS) RADIAL;  Surgeon: Rayford Cake, MD;  Location: [facility] ENDOSCOPY;  Service: Gastroenterology;  Laterality: N/A;   LEFT HEART CATH AND CORONARY ANGIOGRAPHY N/A 09/08/2016   Procedure: Left Heart Cath and Coronary Angiography;  Surgeon: Cherrie Cornwall, MD;  Location: [facility] INVASIVE CV LAB;  Service: Cardiovascular;  Laterality: N/A;   US  ECHOCARDIOGRAPHY     Social History:  reports that she quit smoking about 10 years ago. Her smoking use included cigarettes. She has never used smokeless tobacco. She reports that she does not drink alcohol and does not use drugs.  Allergies  Allergen Reactions   Codeine Hives, Itching and Rash   Other Itching and Other (See Comments)    States antibiotic in the past caused itching but can not remember name    Family History  Problem Relation Age of Onset   Breast cancer Paternal Aunt    Other Father    Heart attack Father     Prior to Admission medications   Medication Sig Start Date End Date Taking? Authorizing Provider  albuterol  (VENTOLIN  HFA) 108 (90 Base) MCG/ACT inhaler INHALE 1-2 PUFFS EVERY 4-6 HOURS AS NEEDED FOR SHORTNESS OF BREATH 01/17/23   Trenda Frisk, FNP  atorvastatin  (LIPITOR) 40 MG tablet ONCE A DAY FOR CHOLESTEROL 11/23/22   Trenda Frisk, FNP  azelastine  (ASTELIN ) 0.1 % nasal spray Place 2 sprays into both nostrils 2 (two) times daily as needed for rhinitis or allergies.  10/13/16   [provider]  baclofen  (LIORESAL ) 10 MG tablet TAKE 1 TABLET BY MOUTH TWICE DAILY AS NEEDED FOR BACK SPASM. 11/24/22   Aisha Hove, MD  budesonide -formoterol  [facility]) 160-4.5 MCG/ACT inhaler Inhale 2 puffs into the lungs 2 (two) times daily.    [provider]  chlorhexidine  (PERIDEX ) 0.12 % solution USE AS DIRECTED 15 ML IN THE MOUTH OR THROAT TWICE A DAY AND SPIT OUT 08/03/23   Trenda Frisk,  FNP  chlorpheniramine-HYDROcodone  (TUSSIONEX) 10-8 MG/5ML Take 5 mLs by mouth every 12 (twelve) hours as needed for cough. 07/05/23   Trenda Frisk, FNP  diazepam  (VALIUM ) 2 MG tablet Take 1 tablet (2 mg total) by mouth every 8 (eight) hours as needed for muscle spasms. 01/13/23   Sung, Jade J, MD  ELIQUIS  5 MG TABS tablet TAKE 1 TABLET BY MOUTH TWICE A DAY 08/03/23   Trenda Frisk, FNP  enalapril (VASOTEC) 2.5 MG tablet TAKE 1 TABLET BY MOUTH EVERY DAY 08/03/23   Trenda Frisk, FNP  esomeprazole (NEXIUM) 40 MG capsule TAKE 1 CAPSULE BY MOUTH EVERY DAY IN THE MORNING 11/23/22   Trenda Frisk, FNP  gabapentin  (NEURONTIN ) 300 MG capsule Take 300 mg by mouth 2 (two) times daily.    [provider]  HYDROcodone -acetaminophen  (NORCO) 5-325 MG tablet Take 1 tablet by mouth every 6 (six) hours as needed for moderate pain. Patient not taking: Reported on 06/05/2023 01/13/23   Sung, Jade J, MD  isosorbide mononitrate (IMDUR) 30 MG 24 hr tablet TAKE 1 TABLET BY MOUTH EVERYDAY  AT BEDTIME 10/11/22   Trenda Frisk, FNP  levofloxacin  (LEVAQUIN ) 500 MG tablet Take 1 tablet (500 mg total) by mouth daily. Patient not taking: Reported on 06/05/2023 03/27/23   Trenda Frisk, FNP  meloxicam (MOBIC) 15 MG tablet TAKE 1 TABLET BY MOUTH EVERY DAY 06/20/23   Aisha Hove, MD  methylPREDNISolone  (MEDROL  DOSEPAK) 4 MG TBPK tablet As directed Patient not taking: Reported on 06/05/2023 05/04/23   Scoggins, Amber, NP  metoprolol  succinate (TOPROL -XL) 25 MG 24 hr tablet TAKE 1 TABLET (25 MG TOTAL) BY MOUTH DAILY. 04/27/23   Trenda Frisk, FNP  montelukast  (SINGULAIR ) 10 MG tablet TAKE 1 TABLET BY MOUTH EVERY DAY 11/23/22   Trenda Frisk, FNP  nystatin  powder Apply 1 Application topically 3 (three) times daily as needed. Irritation under breast and groin area 07/12/23   Trenda Frisk, FNP  promethazine  (PHENERGAN ) 12.5 MG tablet Take 1 tablet (12.5 mg total) by mouth every 8 (eight) hours as needed  (for cough). 12/27/22   Aisha Hove, MD  PROMETHAZINE -PHENYLEPHRINE  6.25-5 MG/5ML syrup TAKE EVERY 4-6 HOURS AS NEEDED FOR COUGH. Patient not taking: Reported on 06/05/2023 03/17/23   Trenda Frisk, FNP  TALTZ 80 MG/ML SOAJ Inject into the skin.    [provider]  tirzepatide  (MOUNJARO ) 2.5 MG/0.5ML Pen INJECT 2.5 MG SUBCUTANEOUSLY WEEKLY 02/28/23   Trenda Frisk, FNP  triamcinolone  cream (KENALOG ) 0.1 % APPLY TO AFFECTED AREA TWICE A DAY 08/03/23   Trenda Frisk, FNP  Vibegron  (GEMTESA ) 75 MG TABS Take 1 tablet (75 mg total) by mouth daily. 01/31/23   Trenda Frisk, FNP  Vitamin D , Ergocalciferol , (DRISDOL ) 1.25 MG (50000 UNIT) CAPS capsule Take 1 capsule (50,000 Units total) by mouth every 7 (seven) days. 11/08/22   Trenda Frisk, FNP    Physical Exam: Vitals:   08/05/23 7628 08/05/23 0923 08/05/23 1000  BP:  131/88 136/70  Pulse: 72  (!) 51  Resp: (!) 24  (!) 21  Temp: 99.2 F (37.3 C)    TempSrc: Oral    SpO2: 94%  100%  Weight:  131.5 kg   Height:  5\' 4"  (1.626 m)    Physical Exam Constitutional:      Appearance: She is obese.  HENT:     Head: Normocephalic and atraumatic.     Nose: Nose normal.  Eyes:     Pupils: Pupils are equal, round, and reactive to light.  Cardiovascular:     Rate and Rhythm: Normal rate and regular rhythm.  Pulmonary:     Effort: Pulmonary effort is normal.     Breath sounds: Wheezing present.  Abdominal:     General: Bowel sounds are normal.  Musculoskeletal:     Right lower leg: Edema present.     Left lower leg: Edema present.  Skin:    General: Skin is warm.  Neurological:     General: No focal deficit present.  Psychiatric:        Mood and Affect: Mood normal.     Data Reviewed:  There are no new results to review at this time.  DG Chest 2 View CLINICAL DATA:  Shortness of breath and chest pain  EXAM: CHEST - 2 VIEW  COMPARISON:  02/23/2023  FINDINGS: Stable cardiomediastinal contours.  Aortic atherosclerotic calcifications. No pleural effusion, interstitial edema or airspace disease. The visualized osseous structures are unremarkable.  IMPRESSION: No acute cardiopulmonary disease.  Aortic Atherosclerosis (ICD10-I70.0).  Electronically Signed   By:  Kimberley Penman M.D.   On: 08/05/2023 09:49  Lab Results  Component Value Date   WBC 8.1 08/05/2023   HGB 14.0 08/05/2023   HCT 42.0 08/05/2023   MCV 86.1 08/05/2023   PLT 269 08/05/2023   Last metabolic panel Lab Results  Component Value Date   GLUCOSE 122 (H) 08/05/2023   NA 137 08/05/2023   K 3.9 08/05/2023   CL 102 08/05/2023   CO2 25 08/05/2023   BUN 13 08/05/2023   CREATININE 0.96 08/05/2023   GFRNONAA >60 08/05/2023   CALCIUM  8.3 (L) 08/05/2023   PROT 7.1 02/02/2023   ALBUMIN 3.8 02/02/2023   LABGLOB 2.7 11/01/2022   BILITOT 0.8 02/02/2023   ALKPHOS 77 02/02/2023   AST 20 02/02/2023   ALT 17 02/02/2023   ANIONGAP 10 08/05/2023    Assessment and Plan: Acute on chronic heart failure with preserved ejection fraction (HFpEF) (HCC) 2D 02/2023 w/ EF 65%  Increased WOB, cough on presentation  Overlapping COPD Exacerbation  BNP 464- above baseline  CXR grossly stable  Morbid obesity- mixed volume status  IV lasix   Monitor UOP  Consult cardiology as appropriate   COPD exacerbation (HCC) + cough, wheezing, increased sputum production over 3-4 days concerning for COPD exacerbation  CXR grossly stable  IV solumedrol  Prn duonebs  IV azithromycin   Monitor   Obstructive sleep apnea syndrome CPAP   Hypertension BP stable  Titrate home regimen    Hyperlipidemia, unspecified Cont statin    Gastroesophageal reflux disease PPI   Coronary artery disease of native artery of native heart with stable angina pectoris (HCC) Noted baseline CAD  Trop in teens  No active CP  Cont home regimen including ASA, statin    Atrial fibrillation (HCC) Rate controlled atrial fibrillation on EKG   Cont home regimen including eliquis , metoprolol       Greater than 50% was spent in counseling and coordination of care with patient Total encounter time 80 minutes or more   Advance Care Planning:   Code Status: Full Code   Consults: none  Family Communication: no family at the bedside  Severity of Illness: The appropriate patient status for this patient is OBSERVATION. Observation status is judged to be reasonable and necessary in order to provide the required intensity of service to ensure the patient's safety. The patient's presenting symptoms, physical exam findings, and initial radiographic and laboratory data in the context of their medical condition is felt to place them at decreased risk for further clinical deterioration. Furthermore, it is anticipated that the patient will be medically stable for discharge from the hospital within 2 midnights of admission.   Author: Corrinne Din, MD 08/05/2023 12:14 PM  For on call review [email or web address].

## 2023-08-05 NOTE — ED Triage Notes (Signed)
 Pt to ED for CP and SOB since yesterday morning. Also coughing some since yesterday, states green sputum. Pt denies CHF and COPD. Skin is dry.

## 2023-08-05 NOTE — Evaluation (Signed)
 Occupational Therapy Evaluation Patient Details Name: Patricia Walker MRN: [id number] DOB: 10-09-1945 Today's Date: 08/05/2023   History of Present Illness   Pt is a 78 y.o. female admitted with COPD exacerbation acute on chronic HFpEF. PMH significant for asthma, DOE, CAD, depression, HTN, MI, morbid obesity, & AFIB     Clinical Impressions PTA, pt is able to complete BADLs independently and uses SPC intermittently. Lives with children in a one level home with 3 STE. Pt presents with DOE, poor activity tolerance and mild deficits in strength/balance. Performs bed mobility in gurney independently, able to rise from seated with supervision, no AD and perform step pivot t/f bari BSC with assist only for lines/leads. MIN A for pericare secondary to increased WOB and fatigue. Pt states she has been transferring independently since arrival to ED due to frequent need to void. Further ambulation/mobility deferred due to environmental setup (IV hooked up to bed). Will assess further next session. Pt would benefit from skilled OT services to address noted impairments and functional limitations (see below for any additional details) in order to maximize safety and independence while minimizing falls risk and caregiver burden. Anticipate the need for follow up [facility] OT services upon acute hospital DC.      If plan is discharge home, recommend the following:   A little help with walking and/or transfers;A little help with bathing/dressing/bathroom;Assistance with cooking/housework     Functional Status Assessment   Patient has had a recent decline in their functional status and demonstrates the ability to make significant improvements in function in a reasonable and predictable amount of time.     Equipment Recommendations   BSC/3in1      Precautions/Restrictions   Precautions Precautions: Fall Restrictions Weight Bearing Restrictions Per Provider Order: No     Mobility Bed  Mobility Overal bed mobility: Independent             General bed mobility comments: pt recieved getting out of ED gurney to use BSC, requires assist for line mmgt as pt hooked up to IV on bed    Transfers Overall transfer level: Needs assistance Equipment used: None Transfers: Bed to chair/wheelchair/BSC, Sit to/from Stand Sit to Stand: Supervision     Step pivot transfers: Supervision     General transfer comment: pt performs t/f BSC without LOB, pt fatigues quickly and increasing RR with exertion      Balance Overall balance assessment: Mild deficits observed, not formally tested                                         ADL either performed or assessed with clinical judgement   ADL Overall ADL's : Needs assistance/impaired     Grooming: Set up;Sitting   Upper Body Bathing: Sitting;Set up   Lower Body Bathing: Minimal assistance;Sit to/from stand   Upper Body Dressing : Sitting;Set up   Lower Body Dressing: Minimal assistance;Sit to/from stand   Toilet Transfer: Electrical engineer Details (indicate cue type and reason): pt has been transferring from gurney t/f bari [facility] in room in ED independently. observed to perform step pivot with supervision (assist only for lines/leads) Toileting- Clothing Manipulation and Hygiene: Minimal assistance;Sit to/from stand Toileting - Clothing Manipulation Details (indicate cue type and reason): pt able to complete anterior pericare in standing, manages clothing supervision. increased WOB ntoed, pt fatiguing quickly requesting assist for posterior care due to body habitus  Functional mobility during ADLs: Contact guard assist        Pertinent Vitals/Pain Pain Assessment Pain Assessment: No/denies pain     Extremity/Trunk Assessment Upper Extremity Assessment Upper Extremity Assessment: Generalized weakness   Lower Extremity Assessment Lower Extremity Assessment: Generalized  weakness       Communication Communication Communication: No apparent difficulties   Cognition Arousal: Alert Behavior During Therapy: WFL for tasks assessed/performed Cognition: No apparent impairments                               Following commands: Intact       Cueing  General Comments   Cueing Techniques: Verbal cues  on RA, increased RR 15-25 with exertion. SpO2 97%, HR 80s-90s.           Home Living Family/patient expects to be discharged to:: Private residence Living Arrangements: Children;Other relatives Available Help at Discharge: Family Type of Home: House Home Access: Stairs to enter [employer] of Steps: 3   Home Layout: One level     Bathroom Shower/Tub: Chief Strategy Officer: Standard     Home Equipment: Agricultural consultant (2 wheels);Cane - single point          Prior Functioning/Environment Prior Level of Function : Independent/Modified Independent             Mobility Comments: SPC, no falls in the last 6 months ADLs Comments: independent    OT Problem List: Decreased activity tolerance;Cardiopulmonary status limiting activity;Decreased strength;Decreased range of motion;Obesity   OT Treatment/Interventions: Self-care/ADL training;Energy conservation;DME and/or AE instruction;Therapeutic activities;Patient/family education;Balance training      OT Goals(Current goals can be found in the care plan section)   Acute Rehab OT Goals OT Goal Formulation: With patient Time For Goal Achievement: 08/19/23 Potential to Achieve Goals: Good   OT Frequency:  Min 2X/week       AM-PAC OT "6 Clicks" Daily Activity     Outcome Measure Help from another person eating meals?: None Help from another person taking care of personal grooming?: None Help from another person toileting, which includes using toliet, bedpan, or urinal?: A Little Help from another person bathing (including washing, rinsing,  drying)?: A Little Help from another person to put on and taking off regular upper body clothing?: None Help from another person to put on and taking off regular lower body clothing?: A Little 6 Click Score: 21   End of Session Nurse Communication: Mobility status  Activity Tolerance: Patient tolerated treatment well Patient left: in bed;with call bell/phone within reach  OT Visit Diagnosis: Other abnormalities of gait and mobility (R26.89);Muscle weakness (generalized) (M62.81)                Time: 9604-5409 OT Time Calculation (min): 15 min Charges:  OT General Charges $OT Visit: 1 Visit OT Evaluation $OT Eval Low Complexity: 1 Low Charistopher Rumble L. Ladislao Cohenour, OTR/L  08/05/23, 3:29 PM

## 2023-08-06 ENCOUNTER — Encounter: Payer: Self-pay | Admitting: Family

## 2023-08-06 DIAGNOSIS — I5033 Acute on chronic diastolic (congestive) heart failure: Secondary | ICD-10-CM | POA: Diagnosis not present

## 2023-08-06 DIAGNOSIS — I480 Paroxysmal atrial fibrillation: Secondary | ICD-10-CM | POA: Diagnosis not present

## 2023-08-06 LAB — COMPREHENSIVE METABOLIC PANEL WITH GFR
ALT: 16 U/L (ref 0–44)
AST: 23 U/L (ref 15–41)
Albumin: 3.3 g/dL — ABNORMAL LOW (ref 3.5–5.0)
Alkaline Phosphatase: 57 U/L (ref 38–126)
Anion gap: 10 (ref 5–15)
BUN: 20 mg/dL (ref 8–23)
CO2: 26 mmol/L (ref 22–32)
Calcium: 8.4 mg/dL — ABNORMAL LOW (ref 8.9–10.3)
Chloride: 99 mmol/L (ref 98–111)
Creatinine, Ser: 1.08 mg/dL — ABNORMAL HIGH (ref 0.44–1.00)
GFR, Estimated: 53 mL/min — ABNORMAL LOW (ref >60)
Glucose, Bld: 229 mg/dL — ABNORMAL HIGH (ref 70–99)
Potassium: 3.7 mmol/L (ref 3.5–5.1)
Sodium: 135 mmol/L (ref 135–145)
Total Bilirubin: 0.7 mg/dL (ref 0.0–1.2)
Total Protein: 6.4 g/dL — ABNORMAL LOW (ref 6.5–8.1)

## 2023-08-06 LAB — HIV ANTIBODY (ROUTINE TESTING W REFLEX): HIV Screen 4th Generation wRfx: NONREACTIVE

## 2023-08-06 LAB — CBC
HCT: 38.5 % (ref 36.0–46.0)
Hemoglobin: 13 g/dL (ref 12.0–15.0)
MCH: 28.4 pg (ref 26.0–34.0)
MCHC: 33.8 g/dL (ref 30.0–36.0)
MCV: 84.2 fL (ref 80.0–100.0)
Platelets: 276 10*3/uL (ref 150–400)
RBC: 4.57 MIL/uL (ref 3.87–5.11)
RDW: 12.9 % (ref 11.5–15.5)
WBC: 8.2 10*3/uL (ref 4.0–10.5)
nRBC: 0 % (ref 0.0–0.2)

## 2023-08-06 LAB — GLUCOSE, CAPILLARY
Glucose-Capillary: 169 mg/dL — ABNORMAL HIGH (ref 70–99)
Glucose-Capillary: 166 mg/dL — ABNORMAL HIGH (ref 70–99)
Glucose-Capillary: 126 mg/dL — ABNORMAL HIGH (ref 70–99)

## 2023-08-06 LAB — HEMOGLOBIN A1C
Hgb A1c MFr Bld: 5.3 % (ref 4.8–5.6)
Mean Plasma Glucose: 105.41 mg/dL

## 2023-08-06 MED ORDER — INSULIN ASPART 100 UNIT/ML IJ SOLN
0.0000 [IU] | Freq: Three times a day (TID) | INTRAMUSCULAR | Status: DC
  Administered 2023-08-06 (×2): 2 [IU] via SUBCUTANEOUS
  Filled 2023-08-06 (×2): qty 1

## 2023-08-06 NOTE — Progress Notes (Signed)
 Acute Office Visit  Subjective:     Patient ID: Patricia Walker, female    DOB: 03-16-1946, 78 y.o.   MRN: [id number]  Patient is in today for  Chief Complaint  Patient presents with   Rash    Rash This is a recurrent problem. The current episode started in the past 7 days. The problem has been gradually worsening since onset. The rash is diffuse. The rash is characterized by redness, scaling, itchiness and dryness. She was exposed to nothing. Past treatments include topical steroids, anti-itch cream, antibiotic cream, analgesics, antihistamine, cold compress and moisturizer. The treatment provided no relief. Her past medical history is significant for allergies and eczema.     Review of Systems  Skin:  Positive for itching and rash.        Objective:    BP 110/74   Pulse 67   Ht 5\' 4"  (1.626 m)   Wt (!) 303 lb (137.4 kg)   SpO2 98%   BMI 52.01 kg/m   Physical Exam Vitals and nursing note reviewed.  Constitutional:      Appearance: Normal appearance. She is normal weight.  HENT:     Head: Normocephalic.  Eyes:     Extraocular Movements: Extraocular movements intact.     Conjunctiva/sclera: Conjunctivae normal.     Pupils: Pupils are equal, round, and reactive to light.  Cardiovascular:     Rate and Rhythm: Normal rate.  Pulmonary:     Effort: Pulmonary effort is normal.  Skin:    Findings: Rash present.  Neurological:     General: No focal deficit present.     Mental Status: She is alert and oriented to person, place, and time. Mental status is at baseline.  Psychiatric:        Attention and Perception: Attention and perception normal.        Mood and Affect: Mood and affect normal.        Speech: Speech normal.        Behavior: Behavior normal. Behavior is cooperative.        Thought Content: Thought content normal.        Cognition and Memory: Cognition and memory normal.        Judgment: Judgment normal.     No results found for any visits on  06/05/23.  Recent Results (from the past 2160 hours)  Basic metabolic panel     Status: Abnormal   Collection Time: 08/05/23  9:47 AM  Result Value Ref Range   Sodium 137 135 - 145 mmol/L   Potassium 3.9 3.5 - 5.1 mmol/L   Chloride 102 98 - 111 mmol/L   CO2 25 22 - 32 mmol/L   Glucose, Bld 122 (H) 70 - 99 mg/dL    Comment: Glucose reference range applies only to samples taken after fasting for at least 8 hours.   BUN 13 8 - 23 mg/dL   Creatinine, Ser 4.09 0.44 - 1.00 mg/dL   Calcium  8.3 (L) 8.9 - 10.3 mg/dL   GFR, Estimated >81 >19 mL/min    Comment: (NOTE) Calculated using the CKD-EPI Creatinine Equation (2021)    Anion gap 10 5 - 15    Comment: Performed at [facility], [address] [place], [place] [address]  CBC     Status: None   Collection Time: 08/05/23  9:47 AM  Result Value Ref Range   WBC 8.1 4.0 - 10.5 K/uL   RBC 4.88 3.87 - 5.11 MIL/uL  Hemoglobin 14.0 12.0 - 15.0 g/dL   HCT 65.7 84.6 - 96.2 %   MCV 86.1 80.0 - 100.0 fL   MCH 28.7 26.0 - 34.0 pg   MCHC 33.3 30.0 - 36.0 g/dL   RDW 95.2 84.1 - 32.4 %   Platelets 269 150 - 400 K/uL   nRBC 0.0 0.0 - 0.2 %    Comment: Performed at [facility], [address], [place], [place] [address]  Troponin I (High Sensitivity)     Status: Abnormal   Collection Time: 08/05/23  9:47 AM  Result Value Ref Range   Troponin I (High Sensitivity) 19 (H) <18 ng/L    Comment: (NOTE) Elevated high sensitivity troponin I (hsTnI) values and significant  changes across serial measurements Villarin suggest ACS but many other  chronic and acute conditions are known to elevate hsTnI results.  Refer to the "Links" section for chest pain algorithms and additional  guidance. Performed at [facility], [address] [place], [place] [address]   Brain natriuretic peptide     Status: Abnormal   Collection Time: 08/05/23  9:47 AM  Result Value Ref Range   B Natriuretic Peptide 463.7 (H) 0.0 - 100.0 pg/mL     Comment: Performed at [facility], [address] [place], [place] [address]  Troponin I (High Sensitivity)     Status: Abnormal   Collection Time: 08/05/23 10:59 AM  Result Value Ref Range   Troponin I (High Sensitivity) 18 (H) <18 ng/L    Comment: (NOTE) Elevated high sensitivity troponin I (hsTnI) values and significant  changes across serial measurements Rishel suggest ACS but many other  chronic and acute conditions are known to elevate hsTnI results.  Refer to the "Links" section for chest pain algorithms and additional  guidance. Performed at [facility], [address] [place], [place] [address]   Resp panel by RT-PCR (RSV, Flu A&B, Covid) Anterior Nasal Swab     Status: None   Collection Time: 08/05/23 10:59 AM   Specimen: Anterior Nasal Swab  Result Value Ref Range   SARS Coronavirus 2 by RT PCR NEGATIVE NEGATIVE    Comment: (NOTE) SARS-CoV-2 target nucleic acids are NOT DETECTED.  The SARS-CoV-2 RNA is generally detectable in upper respiratory specimens during the acute phase of infection. The lowest concentration of SARS-CoV-2 viral copies this assay can detect is 138 copies/mL. A negative result does not preclude SARS-Cov-2 infection and should not be used as the sole basis for treatment or other patient management decisions. A negative result Schoenfelder occur with  improper specimen collection/handling, submission of specimen other than nasopharyngeal swab, presence of viral mutation(s) within the areas targeted by this assay, and inadequate number of viral copies(<138 copies/mL). A negative result must be combined with clinical observations, patient history, and epidemiological information. The expected result is Negative.  Fact Sheet for Patients:  BloggerCourse.com  Fact Sheet for Healthcare Providers:  SeriousBroker.it  This test is no t yet approved or cleared by the [place]  FDA and  has  been authorized for detection and/or diagnosis of SARS-CoV-2 by FDA under an Emergency Use Authorization (EUA). This EUA will remain  in effect (meaning this test can be used) for the duration of the COVID-19 declaration under Section 564(b)(1) of the Act, 21 U.S.C.section 360bbb-3(b)(1), unless the authorization is terminated  or revoked sooner.       Influenza A by PCR NEGATIVE NEGATIVE   Influenza B by PCR NEGATIVE NEGATIVE  Comment: (NOTE) The Xpert Xpress SARS-CoV-2/FLU/RSV plus assay is intended as an aid in the diagnosis of influenza from Nasopharyngeal swab specimens and should not be used as a sole basis for treatment. Nasal washings and aspirates are unacceptable for Xpert Xpress SARS-CoV-2/FLU/RSV testing.  Fact Sheet for Patients: BloggerCourse.com  Fact Sheet for Healthcare Providers: SeriousBroker.it  This test is not yet approved or cleared by the [place]  FDA and has been authorized for detection and/or diagnosis of SARS-CoV-2 by FDA under an Emergency Use Authorization (EUA). This EUA will remain in effect (meaning this test can be used) for the duration of the COVID-19 declaration under Section 564(b)(1) of the Act, 21 U.S.C. section 360bbb-3(b)(1), unless the authorization is terminated or revoked.     Resp Syncytial Virus by PCR NEGATIVE NEGATIVE    Comment: (NOTE) Fact Sheet for Patients: BloggerCourse.com  Fact Sheet for Healthcare Providers: SeriousBroker.it  This test is not yet approved or cleared by the [place]  FDA and has been authorized for detection and/or diagnosis of SARS-CoV-2 by FDA under an Emergency Use Authorization (EUA). This EUA will remain in effect (meaning this test can be used) for the duration of the COVID-19 declaration under Section 564(b)(1) of the Act, 21 U.S.C. section 360bbb-3(b)(1), unless the authorization is  terminated or revoked.  Performed at [facility], [address] [place], [place] [address]   HIV Antibody (routine testing w rflx)     Status: None   Collection Time: 08/06/23  3:22 AM  Result Value Ref Range   HIV Screen 4th Generation wRfx Non Reactive Non Reactive    Comment: Performed at [facility], [address] [place], [place] [address]  CBC     Status: None   Collection Time: 08/06/23  3:22 AM  Result Value Ref Range   WBC 8.2 4.0 - 10.5 K/uL   RBC 4.57 3.87 - 5.11 MIL/uL   Hemoglobin 13.0 12.0 - 15.0 g/dL   HCT 86.5 78.4 - 69.6 %   MCV 84.2 80.0 - 100.0 fL   MCH 28.4 26.0 - 34.0 pg   MCHC 33.8 30.0 - 36.0 g/dL   RDW 29.5 28.4 - 13.2 %   Platelets 276 150 - 400 K/uL   nRBC 0.0 0.0 - 0.2 %    Comment: Performed at [facility], [address], [place], [place] [address]  Comprehensive metabolic panel     Status: Abnormal   Collection Time: 08/06/23  3:22 AM  Result Value Ref Range   Sodium 135 135 - 145 mmol/L   Potassium 3.7 3.5 - 5.1 mmol/L   Chloride 99 98 - 111 mmol/L   CO2 26 22 - 32 mmol/L   Glucose, Bld 229 (H) 70 - 99 mg/dL    Comment: Glucose reference range applies only to samples taken after fasting for at least 8 hours.   BUN 20 8 - 23 mg/dL   Creatinine, Ser 2.72 (H) 0.44 - 1.00 mg/dL   Calcium  8.4 (L) 8.9 - 10.3 mg/dL   Total Protein 6.4 (L) 6.5 - 8.1 g/dL   Albumin 3.3 (L) 3.5 - 5.0 g/dL   AST 23 15 - 41 U/L   ALT 16 0 - 44 U/L   Alkaline Phosphatase 57 38 - 126 U/L   Total Bilirubin 0.7 0.0 - 1.2 mg/dL   GFR, Estimated 53 (L) >60 mL/min    Comment: (NOTE) Calculated using the CKD-EPI Creatinine Equation (2021)    Anion gap 10 5 - 15  Comment: Performed at [facility], [address] [place], [place] [address]  Glucose, capillary     Status: Abnormal   Collection Time: 08/06/23 12:18 PM  Result Value Ref Range   Glucose-Capillary 169 (H) 70 - 99 mg/dL    Comment: Glucose reference range applies  only to samples taken after fasting for at least 8 hours.  Glucose, capillary     Status: Abnormal   Collection Time: 08/06/23  4:08 PM  Result Value Ref Range   Glucose-Capillary 166 (H) 70 - 99 mg/dL    Comment: Glucose reference range applies only to samples taken after fasting for at least 8 hours.    Allergies as of 06/05/2023       Reactions   Codeine Hives, Itching, Rash   Other Itching, Other (See Comments)   States antibiotic in the past caused itching but can not remember name        Medication List        Accurate as of June 05, 2023 11:59 PM. If you have any questions, ask your nurse or doctor.          albuterol  108 (90 Base) MCG/ACT inhaler Commonly known as: VENTOLIN  HFA INHALE 1-2 PUFFS EVERY 4-6 HOURS AS NEEDED FOR SHORTNESS OF BREATH   atorvastatin  40 MG tablet Commonly known as: LIPITOR ONCE A DAY FOR CHOLESTEROL   azelastine  0.1 % nasal spray Commonly known as: ASTELIN  Place 2 sprays into both nostrils 2 (two) times daily as needed for rhinitis or allergies.   baclofen  10 MG tablet Commonly known as: LIORESAL  TAKE 1 TABLET BY MOUTH TWICE DAILY AS NEEDED FOR BACK SPASM.   budesonide -formoterol  160-4.5 MCG/ACT inhaler Commonly known as: SYMBICORT Inhale 2 puffs into the lungs 2 (two) times daily.   chlorpheniramine-HYDROcodone  10-8 MG/5ML Commonly known as: TUSSIONEX Take 5 mLs by mouth every 12 (twelve) hours as needed for cough.   diazepam  2 MG tablet Commonly known as: Valium  Take 1 tablet (2 mg total) by mouth every 8 (eight) hours as needed for muscle spasms.   Eliquis  5 MG Tabs tablet Generic drug: apixaban  TAKE 1 TABLET BY MOUTH TWICE A DAY   esomeprazole 40 MG capsule Commonly known as: NEXIUM TAKE 1 CAPSULE BY MOUTH EVERY DAY IN THE MORNING   gabapentin  300 MG capsule Commonly known as: NEURONTIN  Take 300 mg by mouth daily.   Gemtesa  75 MG Tabs Generic drug: Vibegron  Take 1 tablet (75 mg total) by mouth daily.    HYDROcodone -acetaminophen  5-325 MG tablet Commonly known as: Norco Take 1 tablet by mouth every 6 (six) hours as needed for moderate pain.   isosorbide mononitrate 30 MG 24 hr tablet Commonly known as: IMDUR TAKE 1 TABLET BY MOUTH EVERYDAY AT BEDTIME   levofloxacin  500 MG tablet Commonly known as: LEVAQUIN  Take 1 tablet (500 mg total) by mouth daily.   meloxicam 15 MG tablet Commonly known as: MOBIC TAKE 1 TABLET BY MOUTH EVERY DAY   methylPREDNISolone  4 MG Tbpk tablet Commonly known as: MEDROL  DOSEPAK As directed   metoprolol  succinate 25 MG 24 hr tablet Commonly known as: TOPROL -XL TAKE 1 TABLET (25 MG TOTAL) BY MOUTH DAILY.   montelukast  10 MG tablet Commonly known as: SINGULAIR  TAKE 1 TABLET BY MOUTH EVERY DAY   Mounjaro  2.5 MG/0.5ML Pen Generic drug: tirzepatide  INJECT 2.5 MG SUBCUTANEOUSLY WEEKLY   promethazine  12.5 MG tablet Commonly known as: PHENERGAN  Take 1 tablet (12.5 mg total) by mouth every 8 (eight) hours as needed (for cough).  promethazine -phenylephrine  6.25-5 MG/5ML syrup Generic drug: promethazine -phenylephrine  TAKE EVERY 4-6 HOURS AS NEEDED FOR COUGH.   Taltz 80 MG/ML pen Generic drug: ixekizumab Inject 80 mg into the skin every 28 (twenty-eight) days.   triamcinolone  cream 0.1 % Commonly known as: KENALOG  Apply 1 Application topically 2 (two) times daily. Started by: Shandrika Ambers M Bob Daversa   Vitamin D  (Ergocalciferol ) 1.25 MG (50000 UNIT) Caps capsule Commonly known as: DRISDOL  Take 1 capsule (50,000 Units total) by mouth every 7 (seven) days.            Assessment & Plan:   Problem List Items Addressed This Visit   None Visit Diagnoses       Rash and nonspecific skin eruption    -  Primary   Sending cream for patient.  she will let me know if not working        Return as previously scheduled unless not improving..  Total time spent: 20 minutes  Trenda Frisk, FNP  06/05/2023   This document [place]i have been  prepared by [facility] Voice Recognition software and as such Fanelli include unintentional dictation errors.

## 2023-08-06 NOTE — Care Management Obs Status (Signed)
 MEDICARE OBSERVATION STATUS NOTIFICATION   Patient Details  Name: Patricia Walker MRN: [id number] Date of Birth: 1945-09-16   Medicare Observation Status Notification Given:  Yes    Holland Lundborg, RN 08/06/2023, 10:17 AM

## 2023-08-06 NOTE — Progress Notes (Addendum)
 Progress Note    Patricia Walker  ZOX:[id number] DOB: [birth year]  DOA: 08/05/2023 PCP: Trenda Frisk, FNP      Brief Narrative:    Medical records reviewed and are as summarized below:  Patricia Walker is a 78 y.o. female with medical history significant of asthma, coronary artery disease, depression, hypertension, atrial fibrillation/atrial flutter, dyslipidemia, hypothyroidism and obstructive sleep apnea on CPAP, HFpEF, COPD, CAD, history of tobacco use disorder (quit about 13 years ago), who presented to the hospital with cough productive of greenish sputum, chest pain, shortness of breath and orthopnea.       Assessment/Plan:   Active Problems:   Atrial fibrillation (HCC)   Coronary artery disease of native artery of native heart with stable angina pectoris (HCC)   Gastroesophageal reflux disease   Hyperlipidemia, unspecified   Hypertension   Obstructive sleep apnea syndrome   COPD exacerbation (HCC)   Acute on chronic heart failure with preserved ejection fraction (HFpEF) (HCC)    Body mass index is 50.03 kg/m.  (Morbid obesity)   Acute on chronic diastolic CHF: Continue IV Lasix .  Monitor BMP, daily weight and urine output. BNP 464 2D echo November 2024 showed normal LV size following function, mild LVH   Probable COPD exacerbation: Continue steroids, azithromycin  and bronchodilators. She says she has not been formally diagnosed with COPD although chart review shows she has a diagnosis of "obstructive chronic bronchitis without exacerbation" dating back to August 2024. Recommended outpatient follow-up with pulmonologist for lung function test   Hyperglycemia: Check hemoglobin A1c.  Start NovoLog sliding scale as needed for hyperglycemia since she is on steroids. Previous hemoglobin A1c 5.9 in July 2024   Paroxysmal atrial fibrillation: Continue Eliquis    Comorbidities include psoriasis, OSA on CPAP, hypertension, hyperlipidemia, GERD, CAD,  arthritis    Diet Order             Diet heart healthy/carb modified Room service appropriate? Yes; Fluid consistency: Thin  Diet effective now                            Consultants: None  Procedures: None    Medications:    apixaban   5 mg Oral BID   atorvastatin   40 mg Oral Daily   azithromycin   500 mg Oral Daily   furosemide   40 mg Intravenous BID   gabapentin   300 mg Oral Daily   insulin aspart  0-9 Units Subcutaneous TID WC   ipratropium-albuterol   3 mL Nebulization Q6H   metoprolol  succinate  25 mg Oral Daily   pantoprazole   40 mg Oral Daily   [START ON 08/07/2023] predniSONE   40 mg Oral Q breakfast   sodium chloride  flush  3 mL Intravenous Q12H   Continuous Infusions:  sodium chloride        Anti-infectives (From admission, onward)    Start     Dose/Rate Route Frequency Ordered Stop   08/06/23 1230  azithromycin  (ZITHROMAX ) tablet 500 mg       Placed in "Followed by" Linked Group   500 mg Oral Daily 08/05/23 1222 08/08/23 0959   08/05/23 1230  azithromycin  (ZITHROMAX ) 500 mg in sodium chloride  0.9 % 250 mL IVPB       Placed in "Followed by" Linked Group   500 mg 250 mL/hr over 60 Minutes Intravenous Every 24 hours 08/05/23 1222 08/05/23 1458              Family Communication/Anticipated  D/C date and plan/Code Status   DVT prophylaxis:  apixaban  (ELIQUIS ) tablet 5 mg     Code Status: Full Code  Family Communication: None Disposition Plan: Plan to discharge home   Status is: Observation The patient will require care spanning > 2 midnights and should be moved to inpatient because: CHF is the patient       Subjective:   Interval events noted.  Breathing is better.  She feels her belly is distended.  No leg swelling.  No chest pain.  Objective:    Vitals:   08/05/23 2037 08/06/23 0500 08/06/23 0730 08/06/23 0914  BP:    108/60  Pulse:    (!) 58  Resp:    19  Temp:    (!) 97.5 F (36.4 C)  TempSrc:    Oral   SpO2: 94%  96% 98%  Weight:  132.2 kg    Height:       No data found.   Intake/Output Summary (Last 24 hours) at 08/06/2023 1034 Last data filed at 08/05/2023 2021 Gross per 24 hour  Intake 490 ml  Output --  Net 490 ml   Filed Weights   08/05/23 0923 08/06/23 0500  Weight: 131.5 kg 132.2 kg    Exam:  GEN: NAD SKIN: Warm and dry EYES: No pallor or icterus ENT: MMM CV: RRR PULM: Decreased air entry bilaterally, bilateral wheezing ABD: soft, obese/distended, NT, +BS CNS: AAO x 3, non focal EXT: No edema or tenderness        Data Reviewed:   I have personally reviewed following labs and imaging studies:  Labs: Labs show the following:   Basic Metabolic Panel: Recent Labs  Lab 08/05/23 0947 08/06/23 0322  NA 137 135  K 3.9 3.7  CL 102 99  CO2 25 26  GLUCOSE 122* 229*  BUN 13 20  CREATININE 0.96 1.08*  CALCIUM  8.3* 8.4*   GFR Estimated Creatinine Clearance: 59 mL/min (A) (by C-G formula based on SCr of 1.08 mg/dL (H)). Liver Function Tests: Recent Labs  Lab 08/06/23 0322  AST 23  ALT 16  ALKPHOS 57  BILITOT 0.7  PROT 6.4*  ALBUMIN 3.3*   No results for input(s): "LIPASE", "AMYLASE" in the last 168 hours. No results for input(s): "AMMONIA" in the last 168 hours. Coagulation profile No results for input(s): "INR", "PROTIME" in the last 168 hours.  CBC: Recent Labs  Lab 08/05/23 0947 08/06/23 0322  WBC 8.1 8.2  HGB 14.0 13.0  HCT 42.0 38.5  MCV 86.1 84.2  PLT 269 276   Cardiac Enzymes: No results for input(s): "CKTOTAL", "CKMB", "CKMBINDEX", "TROPONINI" in the last 168 hours. BNP (last 3 results) No results for input(s): "PROBNP" in the last 8760 hours. CBG: No results for input(s): "GLUCAP" in the last 168 hours. D-Dimer: No results for input(s): "DDIMER" in the last 72 hours. Hgb A1c: No results for input(s): "HGBA1C" in the last 72 hours. Lipid Profile: No results for input(s): "CHOL", "HDL", "LDLCALC", "TRIG", "CHOLHDL",  "LDLDIRECT" in the last 72 hours. Thyroid  function studies: No results for input(s): "TSH", "T4TOTAL", "T3FREE", "THYROIDAB" in the last 72 hours.  Invalid input(s): "FREET3" Anemia work up: No results for input(s): "VITAMINB12", "FOLATE", "FERRITIN", "TIBC", "IRON", "RETICCTPCT" in the last 72 hours. Sepsis Labs: Recent Labs  Lab 08/05/23 0947 08/06/23 0322  WBC 8.1 8.2    Microbiology Recent Results (from the past 240 hours)  Resp panel by RT-PCR (RSV, Flu A&B, Covid) Anterior Nasal Swab  Status: None   Collection Time: 08/05/23 10:59 AM   Specimen: Anterior Nasal Swab  Result Value Ref Range Status   SARS Coronavirus 2 by RT PCR NEGATIVE NEGATIVE Final    Comment: (NOTE) SARS-CoV-2 target nucleic acids are NOT DETECTED.  The SARS-CoV-2 RNA is generally detectable in upper respiratory specimens during the acute phase of infection. The lowest concentration of SARS-CoV-2 viral copies this assay can detect is 138 copies/mL. A negative result does not preclude SARS-Cov-2 infection and should not be used as the sole basis for treatment or other patient management decisions. A negative result Okun occur with  improper specimen collection/handling, submission of specimen other than nasopharyngeal swab, presence of viral mutation(s) within the areas targeted by this assay, and inadequate number of viral copies(<138 copies/mL). A negative result must be combined with clinical observations, patient history, and epidemiological information. The expected result is Negative.  Fact Sheet for Patients:  BloggerCourse.com  Fact Sheet for Healthcare Providers:  SeriousBroker.it  This test is no t yet approved or cleared by the [place]  FDA and  has been authorized for detection and/or diagnosis of SARS-CoV-2 by FDA under an Emergency Use Authorization (EUA). This EUA will remain  in effect (meaning this test can be used) for the  duration of the COVID-19 declaration under Section 564(b)(1) of the Act, 21 U.S.C.section 360bbb-3(b)(1), unless the authorization is terminated  or revoked sooner.       Influenza A by PCR NEGATIVE NEGATIVE Final   Influenza B by PCR NEGATIVE NEGATIVE Final    Comment: (NOTE) The Xpert Xpress SARS-CoV-2/FLU/RSV plus assay is intended as an aid in the diagnosis of influenza from Nasopharyngeal swab specimens and should not be used as a sole basis for treatment. Nasal washings and aspirates are unacceptable for Xpert Xpress SARS-CoV-2/FLU/RSV testing.  Fact Sheet for Patients: BloggerCourse.com  Fact Sheet for Healthcare Providers: SeriousBroker.it  This test is not yet approved or cleared by the [place]  FDA and has been authorized for detection and/or diagnosis of SARS-CoV-2 by FDA under an Emergency Use Authorization (EUA). This EUA will remain in effect (meaning this test can be used) for the duration of the COVID-19 declaration under Section 564(b)(1) of the Act, 21 U.S.C. section 360bbb-3(b)(1), unless the authorization is terminated or revoked.     Resp Syncytial Virus by PCR NEGATIVE NEGATIVE Final    Comment: (NOTE) Fact Sheet for Patients: BloggerCourse.com  Fact Sheet for Healthcare Providers: SeriousBroker.it  This test is not yet approved or cleared by the [place]  FDA and has been authorized for detection and/or diagnosis of SARS-CoV-2 by FDA under an Emergency Use Authorization (EUA). This EUA will remain in effect (meaning this test can be used) for the duration of the COVID-19 declaration under Section 564(b)(1) of the Act, 21 U.S.C. section 360bbb-3(b)(1), unless the authorization is terminated or revoked.  Performed at [facility], [address] [place], [place] [address]     Procedures and diagnostic studies:  DG Chest 2  View Result Date: 08/05/2023 CLINICAL DATA:  Shortness of breath and chest pain EXAM: CHEST - 2 VIEW COMPARISON:  02/23/2023 FINDINGS: Stable cardiomediastinal contours. Aortic atherosclerotic calcifications. No pleural effusion, interstitial edema or airspace disease. The visualized osseous structures are unremarkable. IMPRESSION: No acute cardiopulmonary disease. Aortic Atherosclerosis (ICD10-I70.0). Electronically Signed   By: Kimberley Penman M.D.   On: 08/05/2023 09:49               LOS: 0 days   Ladarrion Telfair  [facility]   Pager on [email or web address]. If 7PM-7AM, please contact night-coverage at [email or web address]     08/06/2023, 10:34 AM

## 2023-08-06 NOTE — Plan of Care (Signed)
  Problem: Education: Goal: Knowledge of General Education information will improve Description: Including pain rating scale, medication(s)/side effects and non-pharmacologic comfort measures Outcome: Progressing   Problem: Clinical Measurements: Goal: Respiratory complications will improve Outcome: Progressing Goal: Cardiovascular complication will be avoided Outcome: Progressing   Problem: Clinical Measurements: Goal: Cardiovascular complication will be avoided Outcome: Progressing   Problem: Activity: Goal: Risk for activity intolerance will decrease Outcome: Progressing   Problem: Nutrition: Goal: Adequate nutrition will be maintained Outcome: Progressing   Problem: Elimination: Goal: Will not experience complications related to urinary retention Outcome: Progressing   Problem: Pain Managment: Goal: General experience of comfort will improve and/or be controlled Outcome: Progressing   Problem: Education: Goal: Knowledge of disease or condition will improve Outcome: Progressing   Problem: Respiratory: Goal: Ability to maintain a clear airway will improve Outcome: Progressing Goal: Levels of oxygenation will improve Outcome: Progressing Goal: Ability to maintain adequate ventilation will improve Outcome: Progressing   Problem: Respiratory: Goal: Levels of oxygenation will improve Outcome: Progressing   Problem: Respiratory: Goal: Ability to maintain adequate ventilation will improve Outcome: Progressing

## 2023-08-06 NOTE — Progress Notes (Signed)
 Physical Therapy Evaluation Patient Details Name: Patricia Walker MRN: [id number] DOB: [birth year] Today's Date: 08/06/2023  History of Present Illness  Pt is a 78 y/o female admitted secondary to SOB, found to have acute on chronic HF. PMH including but not limited to asthma, coronary artery disease, depression, hypertension, dyslipidemia, hypothyroidism and obstructive sleep apnea on CPAP, HFpEF, COPD, CAD.   Clinical Impression  Pt was standing at the sink brushing her teeth, awake and willing to participate in therapy session upon PT arrival. Prior to admission, pt reported that she ambulated with use of a cane and was independent with ADLs. Pt lives with her sister and grandson in a single level home with two steps to enter (with rails). At the time of evaluation, pt overall at a supervision level with transfers and hallway ambulation with use of her cane. PT will continue to follow-up with pt acutely to progress mobility as tolerated and to ensure a safe d/c home when medically appropriate.        If plan is discharge home, recommend the following: A little help with walking and/or transfers;A little help with bathing/dressing/bathroom;Help with stairs or ramp for entrance;Assist for transportation   Can travel by private vehicle        Equipment Recommendations None recommended by PT  Recommendations for Other Services       Functional Status Assessment Patient has had a recent decline in their functional status and demonstrates the ability to make significant improvements in function in a reasonable and predictable amount of time.     Precautions / Restrictions Precautions Precautions: Fall Restrictions Weight Bearing Restrictions Per Provider Order: No      Mobility  Bed Mobility               General bed mobility comments: pt standing at sink brushing her teeth upon PT arrival and requesting to sit up in recliner chair at end of session    Transfers Overall  transfer level: Needs assistance Equipment used: None Transfers: Sit to/from Stand Sit to Stand: Supervision           General transfer comment: no difficulties noted    Ambulation/Gait Ambulation/Gait assistance: Supervision Gait Distance (Feet): 200 Feet Assistive device: Straight cane Gait Pattern/deviations: Step-through pattern, Decreased stride length Gait velocity: decreased     General Gait Details: pt with slow, steady gait with use of her personal cane, no instability or LOB, pt with increasing SOB at end  Stairs            Wheelchair Mobility     Tilt Bed    Modified Rankin (Stroke Patients Only)       Balance Overall balance assessment: Needs assistance Sitting-balance support: Feet supported Sitting balance-Leahy Scale: Good     Standing balance support: During functional activity, No upper extremity supported Standing balance-Leahy Scale: Fair                               Pertinent Vitals/Pain Pain Assessment Pain Assessment: No/denies pain    Home Living Family/patient expects to be discharged to:: Private residence Living Arrangements: Children;Other relatives Available Help at Discharge: Family Type of Home: House Home Access: Stairs to enter Entrance Stairs-Rails: Doctor, general practice of Steps: 3   Home Layout: One level Home Equipment: Agricultural consultant (2 wheels);Cane - single point;Rollator (4 wheels)      Prior Function Prior Level of Function : Independent/Modified Independent;Driving  Mobility Comments: pt ambulates with use of a cane ADLs Comments: independent     Extremity/Trunk Assessment   Upper Extremity Assessment Upper Extremity Assessment: Defer to OT evaluation    Lower Extremity Assessment Lower Extremity Assessment: Overall WFL for tasks assessed    Cervical / Trunk Assessment Cervical / Trunk Assessment: Normal  Communication   Communication Communication:  No apparent difficulties    Cognition Arousal: Alert Behavior During Therapy: WFL for tasks assessed/performed   PT - Cognitive impairments: No apparent impairments                         Following commands: Intact       Cueing       General Comments      Exercises     Assessment/Plan    PT Assessment Patient needs continued PT services  PT Problem List Decreased strength;Decreased range of motion;Decreased mobility;Cardiopulmonary status limiting activity       PT Treatment Interventions DME instruction;Stair training;Gait training;Therapeutic exercise;Balance training;Functional mobility training;Therapeutic activities;Patient/family education    PT Goals (Current goals can be found in the Care Plan section)  Acute Rehab PT Goals Patient Stated Goal: to go home tomorrow PT Goal Formulation: With patient Time For Goal Achievement: 08/20/23 Potential to Achieve Goals: Good    Frequency Min 2X/week     Co-evaluation               AM-PAC PT "6 Clicks" Mobility  Outcome Measure Help needed turning from your back to your side while in a flat bed without using bedrails?: None Help needed moving from lying on your back to sitting on the side of a flat bed without using bedrails?: None Help needed moving to and from a bed to a chair (including a wheelchair)?: None Help needed standing up from a chair using your arms (e.g., wheelchair or bedside chair)?: None Help needed to walk in hospital room?: A Little Help needed climbing 3-5 steps with a railing? : A Little 6 Click Score: 22    End of Session   Activity Tolerance: Patient tolerated treatment well Patient left: in chair;with call bell/phone within reach Nurse Communication: Mobility status PT Visit Diagnosis: Other abnormalities of gait and mobility (R26.89)    Time: 1610-9604 PT Time Calculation (min) (ACUTE ONLY): 23 min   Charges:   PT Evaluation $PT Eval Low Complexity: 1 Low PT  Treatments $Gait Training: 8-22 mins PT General Charges $$ ACUTE PT VISIT: 1 Visit         Classie Cruise, DPT  Acute Rehabilitation Services Office [phone]   Leory Rands Tayvia Faughnan 08/06/2023, 12:11 PM

## 2023-08-07 DIAGNOSIS — I480 Paroxysmal atrial fibrillation: Secondary | ICD-10-CM | POA: Diagnosis not present

## 2023-08-07 DIAGNOSIS — I5033 Acute on chronic diastolic (congestive) heart failure: Secondary | ICD-10-CM | POA: Diagnosis not present

## 2023-08-07 LAB — BASIC METABOLIC PANEL WITH GFR
Anion gap: 8 (ref 5–15)
BUN: 34 mg/dL — ABNORMAL HIGH (ref 8–23)
CO2: 31 mmol/L (ref 22–32)
Calcium: 8.5 mg/dL — ABNORMAL LOW (ref 8.9–10.3)
Chloride: 98 mmol/L (ref 98–111)
Creatinine, Ser: 1.16 mg/dL — ABNORMAL HIGH (ref 0.44–1.00)
GFR, Estimated: 49 mL/min — ABNORMAL LOW (ref >60)
Glucose, Bld: 110 mg/dL — ABNORMAL HIGH (ref 70–99)
Potassium: 3.7 mmol/L (ref 3.5–5.1)
Sodium: 137 mmol/L (ref 135–145)

## 2023-08-07 LAB — GLUCOSE, CAPILLARY
Glucose-Capillary: 114 mg/dL — ABNORMAL HIGH (ref 70–99)
Glucose-Capillary: 96 mg/dL (ref 70–99)

## 2023-08-07 LAB — MAGNESIUM: Magnesium: 2.2 mg/dL (ref 1.7–2.4)

## 2023-08-07 MED ORDER — FUROSEMIDE 40 MG PO TABS: 20.0000 mg | ORAL_TABLET | Freq: Every day | ORAL | 0 refills | Status: DC

## 2023-08-07 MED ORDER — FUROSEMIDE 40 MG PO TABS
40.0000 mg | ORAL_TABLET | Freq: Every day | ORAL | Status: DC
  Administered 2023-08-07: 40 mg via ORAL
  Filled 2023-08-07: qty 1

## 2023-08-07 MED ORDER — IPRATROPIUM-ALBUTEROL 0.5-2.5 (3) MG/3ML IN SOLN: 3.0000 mL | Freq: Four times a day (QID) | RESPIRATORY_TRACT | Status: DC | PRN

## 2023-08-07 MED ORDER — IPRATROPIUM-ALBUTEROL 0.5-2.5 (3) MG/3ML IN SOLN: 3.0000 mL | Freq: Two times a day (BID) | RESPIRATORY_TRACT | Status: DC

## 2023-08-07 MED ORDER — PREDNISONE 20 MG PO TABS: 40.0000 mg | ORAL_TABLET | Freq: Every day | ORAL | 0 refills | Status: AC

## 2023-08-07 MED ORDER — ACETAMINOPHEN 325 MG PO TABS: 650.0000 mg | ORAL_TABLET | Freq: Four times a day (QID) | ORAL | Status: AC | PRN

## 2023-08-07 NOTE — Plan of Care (Signed)
  Problem: Health Behavior/Discharge Planning: Goal: Ability to manage health-related needs will improve Outcome: Progressing   Problem: Clinical Measurements: Goal: Will remain free from infection Outcome: Progressing   Problem: Activity: Goal: Risk for activity intolerance will decrease Outcome: Progressing   Problem: Coping: Goal: Level of anxiety will decrease Outcome: Progressing   

## 2023-08-07 NOTE — TOC Transition Note (Signed)
 Transition of Care [facility]) - Discharge Note   Patient Details  Name: Ronnye Casagrande [place] MRN: [id number] Date of Birth: Dec 25, 1945  [facility]) CM/SW Contact:  Elsie Halo, RN Phone Number: 08/07/2023, 4:11 PM   Clinical Narrative:     Patient lives at home with her sister and grandson. She drives and doesn't report difficulty get to appointments or obtaining meds. She has a walker, shower chair, rollator, and raised commode. She has a PCP and is medically clear for dc to home. No TOC needs identified.        Patient Goals and CMS Choice            Discharge Placement                       Discharge Plan and Services Additional resources added to the After Visit Summary for                                       Social Drivers of Health (SDOH) Interventions SDOH Screenings   Food Insecurity: No Food Insecurity (08/05/2023)  Housing: Low Risk  (08/05/2023)  Transportation Needs: No Transportation Needs (08/05/2023)  Utilities: Not At Risk (08/05/2023)  Social Connections: Moderately Integrated (08/05/2023)  Tobacco Use: Medium Risk (08/06/2023)     Readmission Risk Interventions     No data to display

## 2023-08-07 NOTE — Progress Notes (Signed)
 Pt has been discharged to home. DC instructions given. Reported some abdominal discomfort at 5/10 stating, "I think I need to poop". Attempted to use the bathroom for a bowel movement, but unsuccessful. Also reported feeling like she had some gas and needed to pass it and would then start to feel better. Reported if she did not feel any better when she got home, would return to the ER. Pt left the unit in wheelchair pushed by Paola Bohr, Cabin crew. Left in stable condition.

## 2023-08-07 NOTE — Progress Notes (Signed)
 Nutrition Brief Note  RD received consult for nutrition assessment   78 y.o. female with medical history significant of asthma, coronary artery disease, depression, anxiety, hypertension, atrial fibrillation/atrial flutter, dyslipidemia, hypothyroidism, obstructive sleep apnea on CPAP, HFpEF, COPD, GERD and history of tobacco use disorder (quit about 13 years ago) who is admitted with COPD and CHF exacerbation.   Wt Readings from Last 15 Encounters:  08/06/23 132.2 kg  06/05/23 (!) 137.4 kg  05/08/23 (!) 136.8 kg  05/04/23 133.8 kg  02/23/23 134.3 kg  02/03/23 133.3 kg  02/02/23 135.2 kg  01/31/23 135.2 kg  01/13/23 136.1 kg  11/01/22 132.5 kg  06/08/21 136.1 kg  02/09/21 131.5 kg  01/28/21 135.7 kg  12/31/20 (!) 138.8 kg  10/08/20 131.5 kg    Body mass index is 50.03 kg/m. Patient meets criteria for morbid obesity based on current BMI.   Current diet order is HH/CHO modified, patient is consuming approximately 70-100% of meals at this time. Labs and medications reviewed.   No nutrition interventions warranted at this time. If nutrition issues arise, please consult RD.   Torrance Freestone MS, RD, LDN If unable to be reached, please send secure chat to "RD inpatient" available from 8:00a-4:00p daily

## 2023-08-07 NOTE — Discharge Summary (Addendum)
 Physician Discharge Summary   Patient: Patricia Walker MRN: [id number] DOB: 09-Mar-1946  Admit date:     08/05/2023  Discharge date: 08/07/23  Discharge Physician: Sheril Dines   PCP: Trenda Frisk, FNP   Recommendations at discharge:   Follow-up with PCP in 1 week  Discharge Diagnoses: Active Problems:   Atrial fibrillation (HCC)   Coronary artery disease of native artery of native heart with stable angina pectoris (HCC)   Gastroesophageal reflux disease   Hyperlipidemia, unspecified   Hypertension   Obstructive sleep apnea syndrome   COPD exacerbation (HCC)   Acute on chronic heart failure with preserved ejection fraction (HFpEF) (HCC)  Resolved Problems:   * No resolved hospital problems. *  Hospital Course:  Patricia Walker is a 78 y.o. female with medical history significant of asthma, coronary artery disease, depression, hypertension, atrial fibrillation/atrial flutter, dyslipidemia, hypothyroidism and obstructive sleep apnea on CPAP, HFpEF, COPD, CAD, history of tobacco use disorder (quit about 13 years ago), who presented to the hospital with cough productive of greenish sputum, chest pain, shortness of breath and orthopnea.     Assessment and Plan:  Acute on chronic diastolic CHF: Improved.  She will be discharged on low-dose Lasix .  BNP 464 2D echo November 2024 showed normal LV size following function, mild LVH     Probable COPD exacerbation: Completed 3 days of azithromycin .  She will be discharged on prednisone .  Continue bronchodilators at discharge.  She said she has a nebulizer at home.    She says she has not been formally diagnosed with COPD although chart review shows she has a diagnosis of "obstructive chronic bronchitis without exacerbation" dating back to August 2024. Recommended outpatient follow-up with pulmonologist for lung function test She is a former smoker.  She smoked for several years but quit smoking about 12 years ago.   Hyperglycemia:  Hemoglobin A1c 5.3.  This is not consistent with diabetes.   Previous hemoglobin A1c 5.9 in July 2024     Paroxysmal atrial fibrillation: Continue Eliquis    She reported some loose stools.  However, last bowel movement this afternoon was formed.  Loose stools probably from antibiotics.     Comorbidities include psoriasis, OSA on CPAP, hypertension, hyperlipidemia, GERD, CAD, arthritis    Her condition has improved and she is deemed stable for discharge to home today.      Consultants: None Procedures performed: None Disposition: Home Diet recommendation:  Discharge Diet Orders (From admission, onward)     Start     Ordered   08/07/23 0000  Diet - low sodium heart healthy        08/07/23 1355           Cardiac diet DISCHARGE MEDICATION: Allergies as of 08/07/2023       Reactions   Codeine Hives, Itching, Rash   Other Itching, Other (See Comments)   States antibiotic in the past caused itching but can not remember name        Medication List     STOP taking these medications    meloxicam 15 MG tablet Commonly known as: MOBIC       TAKE these medications    acetaminophen  325 MG tablet Commonly known as: TYLENOL  Take 2 tablets (650 mg total) by mouth every 6 (six) hours as needed for mild pain (pain score 1-3) or moderate pain (pain score 4-6).   albuterol  108 (90 Base) MCG/ACT inhaler Commonly known as: VENTOLIN  HFA INHALE 1-2 PUFFS EVERY 4-6 HOURS AS  NEEDED FOR SHORTNESS OF BREATH What changed: See the new instructions.   atorvastatin  40 MG tablet Commonly known as: LIPITOR ONCE A DAY FOR CHOLESTEROL What changed: See the new instructions.   azelastine  0.1 % nasal spray Commonly known as: ASTELIN  Place 2 sprays into both nostrils 2 (two) times daily as needed for rhinitis or allergies.   baclofen  10 MG tablet Commonly known as: LIORESAL  TAKE 1 TABLET BY MOUTH TWICE DAILY AS NEEDED FOR BACK SPASM.   budesonide -formoterol  160-4.5 MCG/ACT  inhaler Commonly known as: SYMBICORT Inhale 2 puffs into the lungs 2 (two) times daily.   chlorhexidine  0.12 % solution Commonly known as: PERIDEX  USE AS DIRECTED 15 ML IN THE MOUTH OR THROAT TWICE A DAY AND SPIT OUT   Eliquis  5 MG Tabs tablet Generic drug: apixaban  TAKE 1 TABLET BY MOUTH TWICE A DAY   enalapril 2.5 MG tablet Commonly known as: VASOTEC TAKE 1 TABLET BY MOUTH EVERY DAY   esomeprazole 40 MG capsule Commonly known as: NEXIUM TAKE 1 CAPSULE BY MOUTH EVERY DAY IN THE MORNING   furosemide  40 MG tablet Commonly known as: LASIX  Take 0.5 tablets (20 mg total) by mouth daily. Start taking on: August 08, 2023   gabapentin  300 MG capsule Commonly known as: NEURONTIN  Take 300 mg by mouth daily.   Gemtesa  75 MG Tabs Generic drug: Vibegron  Take 1 tablet (75 mg total) by mouth daily.   ipratropium-albuterol  0.5-2.5 (3) MG/3ML Soln Commonly known as: DUONEB Take 3 mLs by nebulization every 6 (six) hours as needed.   metoprolol  succinate 25 MG 24 hr tablet Commonly known as: TOPROL -XL TAKE 1 TABLET (25 MG TOTAL) BY MOUTH DAILY.   montelukast  10 MG tablet Commonly known as: SINGULAIR  TAKE 1 TABLET BY MOUTH EVERY DAY   nystatin  powder Commonly known as: nystatin  Apply 1 Application topically 3 (three) times daily as needed. Irritation under breast and groin area   predniSONE  20 MG tablet Commonly known as: DELTASONE  Take 2 tablets (40 mg total) by mouth daily with breakfast for 2 days. Start taking on: August 08, 2023   Taltz 80 MG/ML pen Generic drug: ixekizumab Inject 80 mg into the skin every 28 (twenty-eight) days.   triamcinolone  cream 0.1 % Commonly known as: KENALOG  APPLY TO AFFECTED AREA TWICE A DAY        Follow-up Information     Trenda Frisk, FNP Follow up.   Specialty: Family Medicine Why: Hospital follow up Contact information: [address] [place] [address] [phone]                Discharge Exam: Cleavon Curls Weights    08/05/23 2952 08/06/23 0500  Weight: 131.5 kg 132.2 kg   GEN: NAD SKIN: Warm and dry EYES: No pallor or icterus ENT: MMM CV: RRR PULM: CTA B ABD: soft, obese, edema in the lower abdominal area has improved, NT, +BS CNS: AAO x 3, non focal EXT: No edema or tenderness   Condition at discharge: good  The results of significant diagnostics from this hospitalization (including imaging, microbiology, ancillary and laboratory) are listed below for reference.   Imaging Studies: DG Chest 2 View Result Date: 08/05/2023 CLINICAL DATA:  Shortness of breath and chest pain EXAM: CHEST - 2 VIEW COMPARISON:  02/23/2023 FINDINGS: Stable cardiomediastinal contours. Aortic atherosclerotic calcifications. No pleural effusion, interstitial edema or airspace disease. The visualized osseous structures are unremarkable. IMPRESSION: No acute cardiopulmonary disease. Aortic Atherosclerosis (ICD10-I70.0). Electronically Signed   By: Burtis Case.D.  On: 08/05/2023 09:49    Microbiology: Results for orders placed or performed during the hospital encounter of 08/05/23  Resp panel by RT-PCR (RSV, Flu A&B, Covid) Anterior Nasal Swab     Status: None   Collection Time: 08/05/23 10:59 AM   Specimen: Anterior Nasal Swab  Result Value Ref Range Status   SARS Coronavirus 2 by RT PCR NEGATIVE NEGATIVE Final    Comment: (NOTE) SARS-CoV-2 target nucleic acids are NOT DETECTED.  The SARS-CoV-2 RNA is generally detectable in upper respiratory specimens during the acute phase of infection. The lowest concentration of SARS-CoV-2 viral copies this assay can detect is 138 copies/mL. A negative result does not preclude SARS-Cov-2 infection and should not be used as the sole basis for treatment or other patient management decisions. A negative result [place]dez occur with  improper specimen collection/handling, submission of specimen other than nasopharyngeal swab, presence of viral mutation(s) within the areas targeted  by this assay, and inadequate number of viral copies(<138 copies/mL). A negative result must be combined with clinical observations, patient history, and epidemiological information. The expected result is Negative.  Fact Sheet for Patients:  BloggerCourse.com  Fact Sheet for Healthcare Providers:  SeriousBroker.it  This test is no t yet approved or cleared by the [place]  FDA and  has been authorized for detection and/or diagnosis of SARS-CoV-2 by FDA under an Emergency Use Authorization (EUA). This EUA will remain  in effect (meaning this test can be used) for the duration of the COVID-19 declaration under Section 564(b)(1) of the Act, 21 U.S.C.section 360bbb-3(b)(1), unless the authorization is terminated  or revoked sooner.       Influenza A by PCR NEGATIVE NEGATIVE Final   Influenza B by PCR NEGATIVE NEGATIVE Final    Comment: (NOTE) The Xpert Xpress SARS-CoV-2/FLU/RSV plus assay is intended as an aid in the diagnosis of influenza from Nasopharyngeal swab specimens and should not be used as a sole basis for treatment. Nasal washings and aspirates are unacceptable for Xpert Xpress SARS-CoV-2/FLU/RSV testing.  Fact Sheet for Patients: BloggerCourse.com  Fact Sheet for Healthcare Providers: SeriousBroker.it  This test is not yet approved or cleared by the [place]  FDA and has been authorized for detection and/or diagnosis of SARS-CoV-2 by FDA under an Emergency Use Authorization (EUA). This EUA will remain in effect (meaning this test can be used) for the duration of the COVID-19 declaration under Section 564(b)(1) of the Act, 21 U.S.C. section 360bbb-3(b)(1), unless the authorization is terminated or revoked.     Resp Syncytial Virus by PCR NEGATIVE NEGATIVE Final    Comment: (NOTE) Fact Sheet for Patients: BloggerCourse.com  Fact  Sheet for Healthcare Providers: SeriousBroker.it  This test is not yet approved or cleared by the [place]  FDA and has been authorized for detection and/or diagnosis of SARS-CoV-2 by FDA under an Emergency Use Authorization (EUA). This EUA will remain in effect (meaning this test can be used) for the duration of the COVID-19 declaration under Section 564(b)(1) of the Act, 21 U.S.C. section 360bbb-3(b)(1), unless the authorization is terminated or revoked.  Performed at [facility], [address] [place], [place] [address]     Labs: CBC: Recent Labs  Lab 08/05/23 0947 08/06/23 0322  WBC 8.1 8.2  HGB 14.0 13.0  HCT 42.0 38.5  MCV 86.1 84.2  PLT 269 276   Basic Metabolic Panel: Recent Labs  Lab 08/05/23 0947 08/06/23 0322 08/07/23 0503  NA 137 135 137  K 3.9 3.7 3.7  CL 102 99  98  CO2 25 26 31   GLUCOSE 122* 229* 110*  BUN 13 20 34*  CREATININE 0.96 1.08* 1.16*  CALCIUM  8.3* 8.4* 8.5*  MG  --   --  2.2   Liver Function Tests: Recent Labs  Lab 08/06/23 0322  AST 23  ALT 16  ALKPHOS 57  BILITOT 0.7  PROT 6.4*  ALBUMIN 3.3*   CBG: Recent Labs  Lab 08/06/23 1218 08/06/23 1608 08/06/23 2001 08/07/23 0759 08/07/23 1123  GLUCAP 169* 166* 126* 114* 96    Discharge time spent: greater than 30 minutes.  Signed: Sheril Dines, MD [facility] 08/07/2023

## 2023-08-08 ENCOUNTER — Other Ambulatory Visit: Payer: Self-pay | Admitting: Family

## 2023-08-10 DIAGNOSIS — J45909 Unspecified asthma, uncomplicated: Secondary | ICD-10-CM | POA: Diagnosis not present

## 2023-08-14 ENCOUNTER — Other Ambulatory Visit: Payer: Self-pay

## 2023-08-14 MED ORDER — IPRATROPIUM-ALBUTEROL 0.5-2.5 (3) MG/3ML IN SOLN
3.0000 mL | Freq: Four times a day (QID) | RESPIRATORY_TRACT | 2 refills | Status: AC | PRN
Start: 2023-08-14 — End: ?

## 2023-08-17 ENCOUNTER — Ambulatory Visit: Admitting: Family

## 2023-08-17 ENCOUNTER — Encounter: Payer: Self-pay | Admitting: Family

## 2023-08-17 VITALS — BP 118/84 | HR 55 | Ht 64.0 in | Wt 296.6 lb

## 2023-08-17 DIAGNOSIS — R5383 Other fatigue: Secondary | ICD-10-CM

## 2023-08-17 DIAGNOSIS — E782 Mixed hyperlipidemia: Secondary | ICD-10-CM

## 2023-08-17 DIAGNOSIS — R1084 Generalized abdominal pain: Secondary | ICD-10-CM

## 2023-08-17 DIAGNOSIS — J449 Chronic obstructive pulmonary disease, unspecified: Secondary | ICD-10-CM

## 2023-08-17 DIAGNOSIS — E538 Deficiency of other specified B group vitamins: Secondary | ICD-10-CM

## 2023-08-17 DIAGNOSIS — E559 Vitamin D deficiency, unspecified: Secondary | ICD-10-CM

## 2023-08-17 DIAGNOSIS — E039 Hypothyroidism, unspecified: Secondary | ICD-10-CM | POA: Diagnosis not present

## 2023-08-17 DIAGNOSIS — R7303 Prediabetes: Secondary | ICD-10-CM | POA: Diagnosis not present

## 2023-08-17 MED ORDER — BREZTRI AEROSPHERE 160-9-4.8 MCG/ACT IN AERO: 2.0000 | INHALATION_SPRAY | Freq: Two times a day (BID) | RESPIRATORY_TRACT | 3 refills | Status: DC

## 2023-08-21 NOTE — Unmapped (Signed)
 [facility] Clinical Assessment & Refill Coordination Note    Lisa Carlson, DOB: October 06, 1945  Phone: [phone] (home)     All above HIPAA information was verified with patient.     Was a Nurse, learning disability used for this call? No    Specialty Medication(s):   Inflammatory Disorders: Taltz     Current Outpatient Medications   Medication Sig Dispense Refill    albuterol (PROVENTIL HFA;VENTOLIN HFA) 90 mcg/actuation inhaler Inhale 2 puffs every six (6) hours as needed for wheezing.      amoxicillin-clavulanate (AUGMENTIN) 875-125 mg per tablet amoxicillin 875 mg-potassium clavulanate 125 mg tablet      apixaban (ELIQUIS) 5 mg Tab Take 1 tablet (5 mg total) by mouth two (2) times a day.      atorvastatin (LIPITOR) 20 MG tablet Take 1 tablet (20 mg total) by mouth daily.      B-complex with vitamin C (TOTAL B W/C) tablet vitamin b complex with b12      baclofen (LIORESAL) 10 MG tablet Take 1 tablet (10 mg total) by mouth two (2) times a day.      betamethasone, augmented, (DIPROLENE) 0.05 % ointment Apply to scaly rash twice daily until smooth, then stop 100 g 5    calcium carbonate (OS-CAL) 500 mg calcium (1,250 mg) chewable tablet Chew 0.5 tablets (250 mg elem calcium total) daily as needed.      camphor-menthoL (SARNA) 0.5-0.5 % lotion Apply as needed for itch 222 mL 3    cetirizine (ZYRTEC) 10 MG tablet Take 1 tablet (10 mg total) by mouth daily.      ciprofloxacin-dexamethasone (CIPRODEX) 0.3-0.1 % otic suspension Administer 1 drop into both ears daily as needed.      clobetasol (TEMOVATE) 0.05 % cream Apply 1 Application topically Two (2) times a day.      clobetasoL (TEMOVATE) 0.05 % ointment Apply 1 application topically Two (2) times a day. 120 g 1    cyclobenzaprine (FLEXERIL) 10 MG tablet cyclobenzaprine 10 mg tablet      diltiazem (TIAZAC) 120 MG 24 hr capsule diltiazem CD 120 mg capsule,extended release 24 hr      empty container Misc Use as directed to dispose of Humira pens. 1 each 2    esomeprazole (NEXIUM) 40 MG capsule Take 1 capsule (40 mg total) by mouth daily before breakfast.      fluconazole (DIFLUCAN) 100 MG tablet Take 1 tablet (100 mg total) by mouth daily.  0    fluocinolone (SYNALAR) 0.01 % external solution fluocinolone 0.01 % topical solution      fluocinolone acetonide oiL 0.01 % Drop Administer 1 drop into both ears Two (2) times a day. 20 mL 2    fluticasone (FLONASE) 50 mcg/actuation nasal spray 2 sprays into each nostril daily.      furosemide (LASIX) 20 MG tablet Take 1 tablet (20 mg total) by mouth two (2) times a day.      gabapentin (NEURONTIN) 300 MG capsule Take 1 capsule (300 mg total) by mouth Three (3) times a day.      hydroCHLOROthiazide (HYDRODIURIL) 25 MG tablet Take 1 tablet (25 mg total) by mouth daily.      HYDROcodone-acetaminophen (NORCO) 5-325 mg per tablet hydrocodone 5 mg-acetaminophen 325 mg tablet      hydrOXYzine (ATARAX) 25 MG tablet TAKE 1 TABLET BY MOUTH THREE TIMES A DAY AS NEEDED. 270 tablet 1    ibuprofen (ADVIL,MOTRIN) 800 MG tablet Take 1 tablet (800  mg total) by mouth every six (6) hours as needed for pain.      ipratropium (ATROVENT) 42 mcg (0.06 %) nasal spray 2 sprays into each nostril four (4) times a day.      ivermectin (STROMECTOL) 3 mg Tab Take 9 tablets by mouth and repeat in one week 18 tablet 0    ixekizumab (TALTZ AUTOINJECTOR) 80 mg/mL AtIn auto-injector Inject 1 mL (80 mg total) under the skin every twenty-eight (28) days. 1 mL 11    levothyroxine 25 mcg cap Take 50 mg by mouth once.      losartan-hydrochlorothiazide (HYZAAR) 50-12.5 mg per tablet losartan 50 mg-hydrochlorothiazide 12.5 mg tablet      melatonin 10 mg Tab Take by mouth.       meloxicam (MOBIC) 15 MG tablet meloxicam 15 mg tablet      montelukast (SINGULAIR) 10 mg tablet Take 1 tablet (10 mg total) by mouth nightly.      neomycin-bacitracin-polymyxin-hydrocortisone (CORTISPORIN) 3.5-400-10,000 mg-unit/g-1% ophthalmic ointment Two (2) times a day. neomycin-polymyxin-hydrocortisone (CORTISPORIN) 3.5-10,000-1 mg/mL-unit/mL-% otic solution neomycin-polymyxin-hydrocort 3.5 mg/mL-10,000 unit/mL-1 % ear solution      nitroglycerin (NITROSTAT) 0.4 MG SL tablet Place 1 tablet (0.4 mg total) under the tongue every five (5) minutes as needed for chest pain.      nystatin (MYCOSTATIN) 100,000 unit/gram cream Apply topically two (2) times a day. 30 g 5    nystatin (MYCOSTATIN) powder       PARoxetine (PAXIL) 20 MG tablet Take 1 tablet (20 mg total) by mouth daily.      PNV no.63-iron,carbonyl-FA-dha 27 mg iron- 800 mcg-200 mg cap Take 300 mg by mouth every morning before breakfast.       pramoxine (SARNA SENSITIVE) 1 % Lotn Apply 1 application topically two (2) times a day. 222 mL 6    predniSONE (DELTASONE) 5 MG tablet Take 1 tablet (5 mg total) by mouth two (2) times a day.  0    pregabalin (LYRICA) 50 MG capsule Take 1 capsule (50 mg total) by mouth daily as needed.      promethazine (PHENERGAN) 25 MG tablet promethazine 25 mg tablet      ranitidine (ZANTAC) 150 MG tablet Take 1 tablet (150 mg total) by mouth two (2) times a day.      ranolazine (RANEXA) 1,000 mg SR tablet Take 1 tablet (1,000 mg total) by mouth daily before breakfast.      silver sulfaDIAZINE (SILVADENE, SSD) 1 % cream       SPIRIVA RESPIMAT 1.25 mcg/actuation Mist USE AS DIRECTED 1 INHALATION EVERY DAY      SYMBICORT 160-4.5 mcg/actuation inhaler INHALE 1 PUFF ORALLY EVERY 12 HOURS, RINSE MOUTH WELL AFTER EACH USELRH  1    traZODone (DESYREL) 100 MG tablet Take 1 tablet (100 mg total) by mouth nightly.      triamcinolone (KENALOG) 0.1 % ointment Apply a thick layer twice a day to psoriasis until smooth, then stop 454 g 3    triamcinolone (KENALOG) 0.1 % ointment APPLY TO AFFECTED AREA TWICE A DAY 454 g 1     No current facility-administered medications for this visit.        Changes to medications: Lisa Carlson reports no changes at this time.    Medication list has been reviewed and updated in Epic: Yes    Allergies   Allergen Reactions    Codeine Itching, Hives, Other (See Comments) and Rash       Changes to allergies: No  Allergies have been reviewed and updated in Epic: Yes    SPECIALTY MEDICATION ADHERENCE         Medication Adherence    Patient reported X missed doses in the last month: 1  Specialty Medication: Delford Felling (pt was in the hospital)  Patient is on additional specialty medications: No  Informant: patient          Specialty medication(s) dose(s) confirmed: Regimen is correct and unchanged.     Are there any concerns with adherence? No    Adherence counseling provided? Not needed    CLINICAL MANAGEMENT AND INTERVENTION      Clinical Benefit Assessment:    Do you feel the medicine is effective or helping your condition? Yes    Clinical Benefit counseling provided? Not needed    Adverse Effects Assessment:    Are you experiencing any side effects? No    Are you experiencing difficulty administering your medicine? No    Quality of Life Assessment:    Quality of Life    Rheumatology  Oncology  Dermatology  1. What impact has your specialty medication had on the symptoms of your skin condition (i.e. itchiness, soreness, stinging)?: Tremendous  2. What impact has your specialty medication had on your comfort level with your skin?: Tremendous  Cystic Fibrosis          How many days over the past month did your psoriasis  keep you from your normal activities? For example, brushing your teeth or getting up in the morning. 0    Have you discussed this with your provider? Not needed    Acute Infection Status:    Acute infections noted within Epic:  No active infections    Patient reported infection: None    Therapy Appropriateness:    Is therapy appropriate based on current medication list, adverse reactions, adherence, clinical benefit and progress toward achieving therapeutic goals? Yes, therapy is appropriate and should be continued     Clinical Intervention:    Was an intervention completed as part of this clinical assessment? No    DISEASE/MEDICATION-SPECIFIC INFORMATION      For patients on injectable medications: Patient currently has '0 doses left.  Next injection is scheduled for ASAP.    Chronic Inflammatory Diseases: Have you experienced any flares in the last month? No    PATIENT SPECIFIC NEEDS     Does the patient have any physical, cognitive, or cultural barriers? No    Is the patient high risk? No    Does the patient require physician intervention or other additional services (i.e., nutrition, smoking cessation, social work)? No    Does the patient have an additional or emergency contact listed in their chart? Yes    SOCIAL DETERMINANTS OF HEALTH     At the [facility], we have learned that life circumstances - like trouble affording food, housing, utilities, or transportation can affect the health of many of our patients.   That is why we wanted to ask: are you currently experiencing any life circumstances that are negatively impacting your health and/or quality of life? Patient declined to answer    Social Drivers of Health     Food Insecurity: Not on file   Tobacco Use: Medium Risk (08/30/2022)    Patient History     Smoking Tobacco Use: Former     Smokeless Tobacco Use: Never     Passive Exposure: Not on file   Transportation Needs: Not on file   Alcohol Use: Not on  file   Housing: Not on file   Physical Activity: Not on file   Utilities: Not on file   Stress: Not on file   Interpersonal Safety: Not on file   Substance Use: Not on file (02/13/2023)   Intimate Partner Violence: Not on file   Social Connections: Not on file   Financial Resource Strain: Not on file   Health Literacy: Not on file   Internet Connectivity: Not on file       Would you be willing to receive help with any of the needs that you have identified today? Not applicable       SHIPPING     Specialty Medication(s) to be Shipped:   Inflammatory Disorders: Taltz    Other medication(s) to be shipped: No additional medications requested for fill at this time     Changes to insurance: No    Cost and Payment: Patient has a $0 copay, payment information is not required.    Delivery Scheduled: Yes, Expected medication delivery date: 5/13.     Medication will be delivered via Same Day Courier to the confirmed prescription address in [facility].    The patient will receive a drug information handout for each medication shipped and additional FDA Medication Guides as required.  Verified that patient has previously received a Conservation officer, historic buildings and a Surveyor, mining.    The patient or caregiver noted above participated in the development of this care plan and knows that they can request review of or adjustments to the care plan at any time.      All of the patient's questions and concerns have been addressed.    Sherle Dire, PharmD   [facility] Specialty Pharmacist

## 2023-08-22 MED FILL — TALTZ AUTOINJECTOR 80 MG/ML SUBCUTANEOUS: SUBCUTANEOUS | 28 days supply | Qty: 1 | Fill #10

## 2023-08-22 NOTE — Progress Notes (Signed)
 Established Patient Office Visit  Subjective:  Patient ID: Patricia Walker, female    DOB: [birth year]  Age: 78 y.o. MRN: [id number]  Chief Complaint  Patient presents with   Hospitalization Follow-up    Patient here today for follow up after her recent hospitalization.  She was admitted with shortness of breath.  She is feeling some better today, but asks if we can get her set up with Pulmonology, as she has not seen one in a while.  She is also having some severe abdominal pain, says that she is concerned for possible hernia.  She says that it doesn't necessarily seem related to medications or to food intake.       No other concerns at this time.   Past Medical History:  Diagnosis Date   Anxiety    Arthritis    Asthma    COPD exacerbation (HCC) 10/08/2020   Coronary artery disease    mild, nonobstructive   Depression    Dyspnea    on exertion   Dysrhythmia    Atrial Fibrillation   GERD (gastroesophageal reflux disease)    Heart murmur    Hyperlipidemia    Hypertension    Hypothyroidism    MI, old 2016   nonobstructive CAD by Cath   Morbid obesity (HCC)    Persistent atrial fibrillation (HCC)    Pneumonia 06/2018   and RSV   Psoriasis    Sleep apnea    compliant with CPAP   Thyroid  disease     Past Surgical History:  Procedure Laterality Date   ARTERY BIOPSY Right 07/21/2017   Procedure: BIOPSY TEMPORAL ARTERY;  Surgeon: Jama Cordella MATSU, MD;  Location: [facility];  Service: Vascular;  Laterality: Right;   CARDIAC CATHETERIZATION     CARDIOVERSION Right 09/01/2016   Procedure: Cardioversion;  Surgeon: Fernand Denyse LABOR, MD;  Location: [facility];  Service: Cardiovascular;  Laterality: Right;   CARDIOVERSION N/A 09/09/2016   Procedure: Cardioversion;  Surgeon: Fernand Denyse LABOR, MD;  Location: [facility];  Service: Cardiovascular;  Laterality: N/A;   CATARACT EXTRACTION W/PHACO Right 03/26/2019   Procedure: CATARACT EXTRACTION PHACO AND INTRAOCULAR LENS PLACEMENT  (IOC) RIGHT 6.45, 00:39.9;  Surgeon: Jaye Fallow, MD;  Location: [facility];  Service: Ophthalmology;  Laterality: Right;   CATARACT EXTRACTION W/PHACO Left 04/16/2019   Procedure: CATARACT EXTRACTION PHACO AND INTRAOCULAR LENS PLACEMENT (IOC) LEFT;   3.14, 00:24.9;  Surgeon: Jaye Fallow, MD;  Location: [facility];  Service: Ophthalmology;  Laterality: Left;  sleep apnea-CPAP   COLONOSCOPY WITH PROPOFOL  N/A 08/28/2019   Procedure: COLONOSCOPY WITH PROPOFOL ;  Surgeon: Toledo, Ladell POUR, MD;  Location: [facility];  Service: Gastroenterology;  Laterality: N/A;   ELECTROPHYSIOLOGIC STUDY N/A 02/01/2016   Procedure: CARDIOVERSION;  Surgeon: Denyse LABOR Fernand, MD;  Location: [facility];  Service: Cardiovascular;  Laterality: N/A;   ESOPHAGEAL DILATION     ESOPHAGOGASTRODUODENOSCOPY (EGD) WITH PROPOFOL  N/A 02/06/2017   Procedure: ESOPHAGOGASTRODUODENOSCOPY (EGD) WITH PROPOFOL ;  Surgeon: Therisa Bi, MD;  Location: [facility];  Service: Gastroenterology;  Laterality: N/A;   ESOPHAGOGASTRODUODENOSCOPY (EGD) WITH PROPOFOL  N/A 08/28/2019   Procedure: ESOPHAGOGASTRODUODENOSCOPY (EGD) WITH PROPOFOL ;  Surgeon: Toledo, Ladell POUR, MD;  Location: [facility];  Service: Gastroenterology;  Laterality: N/A;   EUS N/A 02/16/2017   Procedure: FULL UPPER ENDOSCOPIC ULTRASOUND (EUS) RADIAL;  Surgeon: Elta Fonda SQUIBB, MD;  Location: [facility];  Service: Gastroenterology;  Laterality: N/A;   LEFT HEART CATH AND CORONARY ANGIOGRAPHY N/A 09/08/2016   Procedure: Left Heart  Cath and Coronary Angiography;  Surgeon: Fernand Denyse LABOR, MD;  Location: [facility];  Service: Cardiovascular;  Laterality: N/A;   US  ECHOCARDIOGRAPHY      Social History   Socioeconomic History   Marital status: Divorced    Spouse name: Not on file   Number of children: Not on file   Years of education: Not on file   Highest education level: Not on file  Occupational History   Not on file  Tobacco Use    Smoking status: Former    Current packs/day: 0.00    Types: Cigarettes    Quit date: 02/01/2013    Years since quitting: 10.5   Smokeless tobacco: Never  Vaping Use   Vaping status: Never Used  Substance and Sexual Activity   Alcohol use: No   Drug use: No   Sexual activity: Not on file  Other Topics Concern   Not on file  Social History Narrative   Lives in [place] with multiple family members   unemployed   Social Drivers of Corporate investment banker Strain: Not on file  Food Insecurity: No Food Insecurity (08/05/2023)   Hunger Vital Sign    Worried About Running Out of Food in the Last Year: Never true    Ran Out of Food in the Last Year: Never true  Transportation Needs: No Transportation Needs (08/05/2023)   PRAPARE - Administrator, Civil Service (Medical): No    Lack of Transportation (Non-Medical): No  Physical Activity: Not on file  Stress: Not on file  Social Connections: Moderately Integrated (08/05/2023)   Social Connection and Isolation Panel [NHANES]    Frequency of Communication with Friends and Family: More than three times a week    Frequency of Social Gatherings with Friends and Family: Twice a week    Attends Religious Services: More than 4 times per year    Active Member of [employer] or Organizations: Yes    Attends Engineer, structural: More than 4 times per year    Marital Status: Divorced  Intimate Partner Violence: Not At Risk (08/05/2023)   Humiliation, Afraid, Rape, and Kick questionnaire    Fear of Current or Ex-Partner: No    Emotionally Abused: No    Physically Abused: No    Sexually Abused: No    Family History  Problem Relation Age of Onset   Breast cancer Paternal Aunt    Other Father    Heart attack Father     Allergies  Allergen Reactions   Codeine Hives, Itching and Rash   Other Itching and Other (See Comments)    States antibiotic in the past caused itching but can not remember name    Review of  Systems  All other systems reviewed and are negative.      Objective:   BP 118/84   Pulse (!) 55   Ht 5' 4 (1.626 m)   Wt 296 lb 9.6 oz (134.5 kg)   SpO2 98%   BMI 50.91 kg/m   Vitals:   08/17/23 0954  BP: 118/84  Pulse: (!) 55  Height: 5' 4 (1.626 m)  Weight: 296 lb 9.6 oz (134.5 kg)  SpO2: 98%  BMI (Calculated): 50.89    Physical Exam Vitals and nursing note reviewed.  Constitutional:      Appearance: Normal appearance. She is normal weight.  HENT:     Head: Normocephalic.  Eyes:     Extraocular Movements: Extraocular movements intact.  Conjunctiva/sclera: Conjunctivae normal.     Pupils: Pupils are equal, round, and reactive to light.  Cardiovascular:     Rate and Rhythm: Normal rate.  Pulmonary:     Effort: Pulmonary effort is normal.     Breath sounds: Wheezing present.  Neurological:     General: No focal deficit present.     Mental Status: She is alert and oriented to person, place, and time. Mental status is at baseline.  Psychiatric:        Mood and Affect: Mood normal.        Behavior: Behavior normal.        Thought Content: Thought content normal.        Judgment: Judgment normal.      No results found for any visits on 08/17/23.  Recent Results (from the past 2160 hours)  Basic metabolic panel     Status: Abnormal   Collection Time: 08/05/23  9:47 AM  Result Value Ref Range   Sodium 137 135 - 145 mmol/L   Potassium 3.9 3.5 - 5.1 mmol/L   Chloride 102 98 - 111 mmol/L   CO2 25 22 - 32 mmol/L   Glucose, Bld 122 (H) 70 - 99 mg/dL    Comment: Glucose reference range applies only to samples taken after fasting for at least 8 hours.   BUN 13 8 - 23 mg/dL   Creatinine, Ser 9.03 0.44 - 1.00 mg/dL   Calcium  8.3 (L) 8.9 - 10.3 mg/dL   GFR, Estimated >39 >39 mL/min    Comment: (NOTE) Calculated using the CKD-EPI Creatinine Equation (2021)    Anion gap 10 5 - 15    Comment: Performed at [facility], [address]  [place], [place] [address]  CBC     Status: None   Collection Time: 08/05/23  9:47 AM  Result Value Ref Range   WBC 8.1 4.0 - 10.5 K/uL   RBC 4.88 3.87 - 5.11 MIL/uL   Hemoglobin 14.0 12.0 - 15.0 g/dL   HCT 57.9 63.9 - 53.9 %   MCV 86.1 80.0 - 100.0 fL   MCH 28.7 26.0 - 34.0 pg   MCHC 33.3 30.0 - 36.0 g/dL   RDW 87.0 88.4 - 84.4 %   Platelets 269 150 - 400 K/uL   nRBC 0.0 0.0 - 0.2 %    Comment: Performed at [facility], [address] [place], [place] [address]  Troponin I (High Sensitivity)     Status: Abnormal   Collection Time: 08/05/23  9:47 AM  Result Value Ref Range   Troponin I (High Sensitivity) 19 (H) <18 ng/L    Comment: (NOTE) Elevated high sensitivity troponin I (hsTnI) values and significant  changes across serial measurements Mabus suggest ACS but many other  chronic and acute conditions are known to elevate hsTnI results.  Refer to the Links section for chest pain algorithms and additional  guidance. Performed at [facility], [address] [place], [place] [address]   Brain natriuretic peptide     Status: Abnormal   Collection Time: 08/05/23  9:47 AM  Result Value Ref Range   B Natriuretic Peptide 463.7 (H) 0.0 - 100.0 pg/mL    Comment: Performed at [facility], [address] [place], [place] [address]  Troponin I (High Sensitivity)     Status: Abnormal   Collection Time: 08/05/23 10:59 AM  Result Value Ref Range   Troponin I (High Sensitivity) 18 (H) <18 ng/L  Comment: (NOTE) Elevated high sensitivity troponin I (hsTnI) values and significant  changes across serial measurements Zia suggest ACS but many other  chronic and acute conditions are known to elevate hsTnI results.  Refer to the Links section for chest pain algorithms and additional  guidance. Performed at [facility], [address] [place], [place] [address]   Resp panel by RT-PCR (RSV, Flu A&B, Covid) Anterior Nasal Swab     Status: None    Collection Time: 08/05/23 10:59 AM   Specimen: Anterior Nasal Swab  Result Value Ref Range   SARS Coronavirus 2 by RT PCR NEGATIVE NEGATIVE    Comment: (NOTE) SARS-CoV-2 target nucleic acids are NOT DETECTED.  The SARS-CoV-2 RNA is generally detectable in upper respiratory specimens during the acute phase of infection. The lowest concentration of SARS-CoV-2 viral copies this assay can detect is 138 copies/mL. A negative result does not preclude SARS-Cov-2 infection and should not be used as the sole basis for treatment or other patient management decisions. A negative result Nolden occur with  improper specimen collection/handling, submission of specimen other than nasopharyngeal swab, presence of viral mutation(s) within the areas targeted by this assay, and inadequate number of viral copies(<138 copies/mL). A negative result must be combined with clinical observations, patient history, and epidemiological information. The expected result is Negative.  Fact Sheet for Patients:  BloggerCourse.com  Fact Sheet for Healthcare Providers:  SeriousBroker.it  This test is no t yet approved or cleared by the [place]  FDA and  has been authorized for detection and/or diagnosis of SARS-CoV-2 by FDA under an Emergency Use Authorization (EUA). This EUA will remain  in effect (meaning this test can be used) for the duration of the COVID-19 declaration under Section 564(b)(1) of the Act, 21 U.S.C.section 360bbb-3(b)(1), unless the authorization is terminated  or revoked sooner.       Influenza A by PCR NEGATIVE NEGATIVE   Influenza B by PCR NEGATIVE NEGATIVE    Comment: (NOTE) The Xpert Xpress SARS-CoV-2/FLU/RSV plus assay is intended as an aid in the diagnosis of influenza from Nasopharyngeal swab specimens and should not be used as a sole basis for treatment. Nasal washings and aspirates are unacceptable for Xpert Xpress  SARS-CoV-2/FLU/RSV testing.  Fact Sheet for Patients: BloggerCourse.com  Fact Sheet for Healthcare Providers: SeriousBroker.it  This test is not yet approved or cleared by the [place]  FDA and has been authorized for detection and/or diagnosis of SARS-CoV-2 by FDA under an Emergency Use Authorization (EUA). This EUA will remain in effect (meaning this test can be used) for the duration of the COVID-19 declaration under Section 564(b)(1) of the Act, 21 U.S.C. section 360bbb-3(b)(1), unless the authorization is terminated or revoked.     Resp Syncytial Virus by PCR NEGATIVE NEGATIVE    Comment: (NOTE) Fact Sheet for Patients: BloggerCourse.com  Fact Sheet for Healthcare Providers: SeriousBroker.it  This test is not yet approved or cleared by the [place]  FDA and has been authorized for detection and/or diagnosis of SARS-CoV-2 by FDA under an Emergency Use Authorization (EUA). This EUA will remain in effect (meaning this test can be used) for the duration of the COVID-19 declaration under Section 564(b)(1) of the Act, 21 U.S.C. section 360bbb-3(b)(1), unless the authorization is terminated or revoked.  Performed at [facility], [address] [place], [place] [address]   HIV Antibody (routine testing w rflx)     Status: None   Collection Time: 08/06/23  3:22 AM  Result Value Ref Range  HIV Screen 4th Generation wRfx Non Reactive Non Reactive    Comment: Performed at [facility], [address] [place], [place] [address]  CBC     Status: None   Collection Time: 08/06/23  3:22 AM  Result Value Ref Range   WBC 8.2 4.0 - 10.5 K/uL   RBC 4.57 3.87 - 5.11 MIL/uL   Hemoglobin 13.0 12.0 - 15.0 g/dL   HCT 61.4 63.9 - 53.9 %   MCV 84.2 80.0 - 100.0 fL   MCH 28.4 26.0 - 34.0 pg   MCHC 33.8 30.0 - 36.0 g/dL   RDW 87.0 88.4 - 84.4 %   Platelets 276 150  - 400 K/uL   nRBC 0.0 0.0 - 0.2 %    Comment: Performed at [facility], [address] [place], [place] [address]  Comprehensive metabolic panel     Status: Abnormal   Collection Time: 08/06/23  3:22 AM  Result Value Ref Range   Sodium 135 135 - 145 mmol/L   Potassium 3.7 3.5 - 5.1 mmol/L   Chloride 99 98 - 111 mmol/L   CO2 26 22 - 32 mmol/L   Glucose, Bld 229 (H) 70 - 99 mg/dL    Comment: Glucose reference range applies only to samples taken after fasting for at least 8 hours.   BUN 20 8 - 23 mg/dL   Creatinine, Ser 8.91 (H) 0.44 - 1.00 mg/dL   Calcium  8.4 (L) 8.9 - 10.3 mg/dL   Total Protein 6.4 (L) 6.5 - 8.1 g/dL   Albumin 3.3 (L) 3.5 - 5.0 g/dL   AST 23 15 - 41 U/L   ALT 16 0 - 44 U/L   Alkaline Phosphatase 57 38 - 126 U/L   Total Bilirubin 0.7 0.0 - 1.2 mg/dL   GFR, Estimated 53 (L) >60 mL/min    Comment: (NOTE) Calculated using the CKD-EPI Creatinine Equation (2021)    Anion gap 10 5 - 15    Comment: Performed at [facility], [address] [place], [place] [address]  Hemoglobin A1c     Status: None   Collection Time: 08/06/23  3:22 AM  Result Value Ref Range   Hgb A1c MFr Bld 5.3 4.8 - 5.6 %    Comment: (NOTE) Pre diabetes:          5.7%-6.4%  Diabetes:              >6.4%  Glycemic control for   <7.0% adults with diabetes    Mean Plasma Glucose 105.41 mg/dL    Comment: Performed at [facility], [address] [place], [place] [address]  Glucose, capillary     Status: Abnormal   Collection Time: 08/06/23 12:18 PM  Result Value Ref Range   Glucose-Capillary 169 (H) 70 - 99 mg/dL    Comment: Glucose reference range applies only to samples taken after fasting for at least 8 hours.  Glucose, capillary     Status: Abnormal   Collection Time: 08/06/23  4:08 PM  Result Value Ref Range   Glucose-Capillary 166 (H) 70 - 99 mg/dL    Comment: Glucose reference range applies only to samples taken after fasting for at least 8 hours.  Glucose,  capillary     Status: Abnormal   Collection Time: 08/06/23  8:01 PM  Result Value Ref Range   Glucose-Capillary 126 (H) 70 - 99 mg/dL    Comment: Glucose reference range applies only to samples taken after fasting for  at least 8 hours.  Basic metabolic panel     Status: Abnormal   Collection Time: 08/07/23  5:03 AM  Result Value Ref Range   Sodium 137 135 - 145 mmol/L   Potassium 3.7 3.5 - 5.1 mmol/L   Chloride 98 98 - 111 mmol/L   CO2 31 22 - 32 mmol/L   Glucose, Bld 110 (H) 70 - 99 mg/dL    Comment: Glucose reference range applies only to samples taken after fasting for at least 8 hours.   BUN 34 (H) 8 - 23 mg/dL   Creatinine, Ser 8.83 (H) 0.44 - 1.00 mg/dL   Calcium  8.5 (L) 8.9 - 10.3 mg/dL   GFR, Estimated 49 (L) >60 mL/min    Comment: (NOTE) Calculated using the CKD-EPI Creatinine Equation (2021)    Anion gap 8 5 - 15    Comment: Performed at [facility], [address] [place], [place] [address]  Magnesium      Status: None   Collection Time: 08/07/23  5:03 AM  Result Value Ref Range   Magnesium  2.2 1.7 - 2.4 mg/dL    Comment: Performed at [facility], [address] [place], [place] [address]  Glucose, capillary     Status: Abnormal   Collection Time: 08/07/23  7:59 AM  Result Value Ref Range   Glucose-Capillary 114 (H) 70 - 99 mg/dL    Comment: Glucose reference range applies only to samples taken after fasting for at least 8 hours.  Glucose, capillary     Status: None   Collection Time: 08/07/23 11:23 AM  Result Value Ref Range   Glucose-Capillary 96 70 - 99 mg/dL    Comment: Glucose reference range applies only to samples taken after fasting for at least 8 hours.       Assessment & Plan Chronic obstructive pulmonary disease, unspecified COPD type (HCC) Setting patient up for referral to pulmonary .  Will defer to them for further treatment changes.  Reassess at follow up.  Vitamin D  deficiency B12 deficiency due to diet Other  fatigue Hypothyroidism (acquired) Checking labs today.  Will continue supplements as needed.   Mixed hyperlipidemia Checking labs today.  Continue current therapy for lipid control. Will modify as needed based on labwork results.   Prediabetes A1C Continues to be in prediabetic ranges.  Will reassess at follow up after next lab check.  Patient counseled on dietary choices and verbalized understanding.   -CBC w/Diff -CMP w/eGFR -Hemoglobin A1C  Obesity, morbid (HCC) Continue current meds.  Will adjust as needed based on results.  The patient is asked to make an attempt to improve diet and exercise patterns to aid in medical management of this problem. Addressed importance of increasing and maintaining water intake.   Generalized abdominal pain CT scan ordered today.  Will call pt with results when available.       No follow-ups on file.   Total time spent: 30 minutes  ALAN CHRISTELLA ARRANT, FNP  08/17/2023   This document Anastos have been prepared by [facility] Voice Recognition software and as such Rocha include unintentional dictation errors.

## 2023-08-22 NOTE — Assessment & Plan Note (Addendum)
 Checking labs today.  Continue current therapy for lipid control. Will modify as needed based on labwork results.

## 2023-08-22 NOTE — Assessment & Plan Note (Addendum)
 Checking labs today.  Will continue supplements as needed.

## 2023-08-22 NOTE — Assessment & Plan Note (Addendum)
 Continue current meds.  Will adjust as needed based on results.  The patient is asked to make an attempt to improve diet and exercise patterns to aid in medical management of this problem. Addressed importance of increasing and maintaining water intake.

## 2023-08-23 LAB — CBC WITH DIFFERENTIAL/PLATELET
Basophils Absolute: 0.1 10*3/uL (ref 0.0–0.2)
Basos: 1 %
EOS (ABSOLUTE): 0.2 10*3/uL (ref 0.0–0.4)
Eos: 2 %
Hematocrit: 44.1 % (ref 34.0–46.6)
Hemoglobin: 14.2 g/dL (ref 11.1–15.9)
Immature Grans (Abs): 0.1 10*3/uL (ref 0.0–0.1)
Immature Granulocytes: 1 %
Lymphocytes Absolute: 1.6 10*3/uL (ref 0.7–3.1)
Lymphs: 20 %
MCH: 28.3 pg (ref 26.6–33.0)
MCHC: 32.2 g/dL (ref 31.5–35.7)
MCV: 88 fL (ref 79–97)
Monocytes Absolute: 0.6 10*3/uL (ref 0.1–0.9)
Monocytes: 8 %
Neutrophils Absolute: 5.5 10*3/uL (ref 1.4–7.0)
Neutrophils: 68 %
Platelets: 303 10*3/uL (ref 150–450)
RBC: 5.02 x10E6/uL (ref 3.77–5.28)
RDW: 13.6 % (ref 11.7–15.4)
WBC: 8.1 10*3/uL (ref 3.4–10.8)

## 2023-08-23 LAB — CMP14+EGFR
ALT: 39 IU/L — ABNORMAL HIGH (ref 0–32)
AST: 32 IU/L (ref 0–40)
Albumin: 4.9 g/dL — ABNORMAL HIGH (ref 3.8–4.8)
Alkaline Phosphatase: 82 IU/L (ref 44–121)
BUN/Creatinine Ratio: 13 (ref 12–28)
BUN: 12 mg/dL (ref 8–27)
Bilirubin Total: 0.4 mg/dL (ref 0.0–1.2)
CO2: 18 mmol/L — ABNORMAL LOW (ref 20–29)
Calcium: 10.1 mg/dL (ref 8.7–10.3)
Chloride: 101 mmol/L (ref 96–106)
Creatinine, Ser: 0.96 mg/dL (ref 0.57–1.00)
Globulin, Total: 2.5 g/dL (ref 1.5–4.5)
Glucose: 157 mg/dL — ABNORMAL HIGH (ref 70–99)
Potassium: 4.7 mmol/L (ref 3.5–5.2)
Sodium: 144 mmol/L (ref 134–144)
Total Protein: 7.4 g/dL (ref 6.0–8.5)
eGFR: 61 mL/min/{1.73_m2} (ref >59)

## 2023-08-23 LAB — LIPID PANEL
Chol/HDL Ratio: 3.5 ratio (ref 0.0–4.4)
Cholesterol, Total: 111 mg/dL (ref 100–199)
HDL: 32 mg/dL — ABNORMAL LOW (ref >39)
LDL Chol Calc (NIH): 50 mg/dL (ref 0–99)
Triglycerides: 176 mg/dL — ABNORMAL HIGH (ref 0–149)
VLDL Cholesterol Cal: 29 mg/dL (ref 5–40)

## 2023-08-23 LAB — HEMOGLOBIN A1C
Est. average glucose Bld gHb Est-mCnc: 128 mg/dL
Hgb A1c MFr Bld: 6.1 % — ABNORMAL HIGH (ref 4.8–5.6)

## 2023-08-23 LAB — IRON,TIBC AND FERRITIN PANEL
Ferritin: 255 ng/mL — ABNORMAL HIGH (ref 15–150)
Iron Saturation: 24 % (ref 15–55)
Iron: 78 ug/dL (ref 27–139)
Total Iron Binding Capacity: 323 ug/dL (ref 250–450)
UIBC: 245 ug/dL (ref 118–369)

## 2023-08-23 LAB — TSH+T4F+T3FREE
Free T4: 1.19 ng/dL (ref 0.82–1.77)
T3, Free: 3.2 pg/mL (ref 2.0–4.4)
TSH: 2.06 u[IU]/mL (ref 0.450–4.500)

## 2023-08-23 LAB — VITAMIN D 25 HYDROXY (VIT D DEFICIENCY, FRACTURES): Vit D, 25-Hydroxy: 37 ng/mL (ref 30.0–100.0)

## 2023-08-23 LAB — VITAMIN B12: Vitamin B-12: 459 pg/mL (ref 232–1245)

## 2023-08-24 ENCOUNTER — Ambulatory Visit
Admission: RE | Admit: 2023-08-24 | Discharge: 2023-08-24 | Disposition: A | Discharge status: Home / Self Care | Source: Ambulatory Visit | Attending: Family | Admitting: Family

## 2023-08-24 ENCOUNTER — Telehealth: Payer: Self-pay | Admitting: Family

## 2023-08-24 DIAGNOSIS — R109 Unspecified abdominal pain: Secondary | ICD-10-CM | POA: Diagnosis not present

## 2023-08-24 DIAGNOSIS — R1084 Generalized abdominal pain: Secondary | ICD-10-CM

## 2023-08-24 NOTE — Telephone Encounter (Signed)
 Patient left VM that she wants something called in for her, she thinks she has a sinus infection. Called in c/o puffy eyes, fluid built up in ears causing pain. Please advise.

## 2023-08-25 ENCOUNTER — Other Ambulatory Visit: Payer: Self-pay

## 2023-08-25 MED ORDER — AMOXICILLIN-POT CLAVULANATE 875-125 MG PO TABS: 1.0000 | ORAL_TABLET | Freq: Two times a day (BID) | ORAL | 0 refills | Status: DC

## 2023-08-28 ENCOUNTER — Ambulatory Visit: Payer: Self-pay | Admitting: Family

## 2023-08-31 ENCOUNTER — Other Ambulatory Visit: Payer: Self-pay

## 2023-08-31 DIAGNOSIS — N951 Menopausal and female climacteric states: Secondary | ICD-10-CM

## 2023-08-31 NOTE — Progress Notes (Signed)
 BMD Order pended

## 2023-09-05 DIAGNOSIS — I480 Paroxysmal atrial fibrillation: Secondary | ICD-10-CM | POA: Diagnosis not present

## 2023-09-05 DIAGNOSIS — R0602 Shortness of breath: Secondary | ICD-10-CM | POA: Diagnosis not present

## 2023-09-05 DIAGNOSIS — G4733 Obstructive sleep apnea (adult) (pediatric): Secondary | ICD-10-CM | POA: Diagnosis not present

## 2023-09-05 DIAGNOSIS — J9809 Other diseases of bronchus, not elsewhere classified: Secondary | ICD-10-CM | POA: Diagnosis not present

## 2023-09-10 DIAGNOSIS — J45909 Unspecified asthma, uncomplicated: Secondary | ICD-10-CM | POA: Diagnosis not present

## 2023-09-12 DIAGNOSIS — L409 Psoriasis, unspecified: Principal | ICD-10-CM

## 2023-09-12 MED ORDER — TALTZ AUTOINJECTOR 80 MG/ML SUBCUTANEOUS
SUBCUTANEOUS | 11 refills | 28.00000 days
Start: 2023-09-12 — End: ?

## 2023-09-12 NOTE — Unmapped (Signed)
Please help pt schedule follow up for medication refill

## 2023-09-13 NOTE — Unmapped (Signed)
.  Lisa Carlson has been contacted in regards to their refill of Taltz. At this time, we are unable to refill due to Provider wants patient to have an appointment to send in a new prescription. I have called the Patient and provided the number to contact office for scheduling. Refill assessment call date has been updated per the patient's request. Will follow-up next week.

## 2023-09-14 DIAGNOSIS — H04223 Epiphora due to insufficient drainage, bilateral lacrimal glands: Secondary | ICD-10-CM | POA: Diagnosis not present

## 2023-09-15 DIAGNOSIS — H6061 Unspecified chronic otitis externa, right ear: Secondary | ICD-10-CM | POA: Diagnosis not present

## 2023-09-15 DIAGNOSIS — H6123 Impacted cerumen, bilateral: Secondary | ICD-10-CM | POA: Diagnosis not present

## 2023-09-18 DIAGNOSIS — H5213 Myopia, bilateral: Secondary | ICD-10-CM | POA: Diagnosis not present

## 2023-09-20 NOTE — Unmapped (Signed)
 Appointment on 07/01 in [place]  Patient was unable to accept a sooner appointment    Thanks,  Jearlean Mince

## 2023-09-21 DIAGNOSIS — H04223 Epiphora due to insufficient drainage, bilateral lacrimal glands: Secondary | ICD-10-CM | POA: Diagnosis not present

## 2023-09-21 NOTE — Unmapped (Addendum)
 09/21/23-Pt has appointment with Derm Provider on 10/17/2023. Updating RC date. No new RX has been submitted at this time AJ

## 2023-10-02 ENCOUNTER — Other Ambulatory Visit: Payer: Self-pay | Admitting: Family

## 2023-10-10 DIAGNOSIS — J45909 Unspecified asthma, uncomplicated: Secondary | ICD-10-CM | POA: Diagnosis not present

## 2023-10-10 MED ORDER — TALTZ AUTOINJECTOR 80 MG/ML SUBCUTANEOUS
SUBCUTANEOUS | 11 refills | 28.00000 days
Start: 2023-10-10 — End: ?

## 2023-10-10 NOTE — Unmapped (Unsigned)
 Dermatology Note    Assessment and Plan   {abridge assessment and plan (Optional):120383}  Benign Lesions/ Findings:   {mmc skin check benign:88279::Angioma(s),Lentigo/Lentigines,Nevus/Nevi-Benign Appearing,Seborrheic Keratosis(es) - no irritation noted}  - Reassurance provided regarding the benign appearance of lesions noted on exam today; no treatment is indicated in the absence of symptoms/changes.  - Reinforced importance of photoprotective strategies including liberal and frequent sunscreen use of a broad-spectrum SPF 30 or greater, use of protective clothing, and sun avoidance for prevention of cutaneous malignancy and photoaging.    Plaque Psoriasis (biopsied 2021), previously failed multiple biologics but most recently controlled on Taltz , previously >30% BSA involved  - Patient is overdue  for TB test. Order for Quant gold placed today and patient understands the importance of getting this done in order to continue therapy  - Continue clobetasol  BID PRN itch   - ixekizumab  (TALTZ  AUTOINJECTOR) 80 mg/mL AtIn auto-injector; Inject 1 mL (80 mg total) under the skin every twenty-eight (28) days.        Personal history of {mmc skin check cancer list:88280::non-melanoma skin cancer}  - No evidence of recurrence at this time.  - Discussed maintaining vigilance and counseled on sun protection as above.    RTC: No follow-ups on file.    Subjective     Lisa Carlson is a 78 y.o. female who presents as a {mmc derm pt type:88281::new patient} for {mmc skin check hpi:88282::***.} ***  {abridge HPI ([id number]    ROS: Negative unless otherwise noted    Objective     {problem overview (Optional):120544}  Physical Exam:  {PE extent (Optional):88284::SKIN (Full Skin Exam): Examination of the face, eyelids, lips, nose, ears, neck, chest, abdomen, back, arms, legs, hands, feet, palms, soles, nails was performed}  {PE list (Optional):88285}  ***

## 2023-10-11 ENCOUNTER — Other Ambulatory Visit: Payer: Self-pay | Admitting: Family

## 2023-10-11 ENCOUNTER — Encounter: Payer: Self-pay | Admitting: Family

## 2023-10-11 ENCOUNTER — Ambulatory Visit: Admitting: Family

## 2023-10-11 VITALS — BP 117/82 | HR 74 | Ht 64.0 in | Wt 301.0 lb

## 2023-10-11 DIAGNOSIS — G3184 Mild cognitive impairment, so stated: Secondary | ICD-10-CM

## 2023-10-11 MED ORDER — ZEPBOUND 2.5 MG/0.5ML ~~LOC~~ SOAJ: 2.5000 mg | SUBCUTANEOUS | 0 refills | Status: DC

## 2023-10-11 MED ORDER — GEMTESA 75 MG PO TABS: 75.0000 mg | ORAL_TABLET | Freq: Every day | ORAL | 5 refills | Status: DC

## 2023-10-11 NOTE — Progress Notes (Unsigned)
 Established Patient Office Visit  Subjective:  Patient ID: Patricia Walker, female    DOB: Jul 07, 1945  Age: 78 y.o. MRN: [id number]  Chief Complaint  Patient presents with   Follow-up    Saw fleming - wanted the echo - changed inhaler.   Having pain in her left hip, right upper quad. Abdomen.   Right knee pain - right leg swollen, having trouble bending. Felt like her knee was going to give out on her when she got up the other day.    No other concerns at this time.   Past Medical History:  Diagnosis Date   Anxiety    Arthritis    Asthma    COPD exacerbation (HCC) 10/08/2020   Coronary artery disease    mild, nonobstructive   Depression    Dyspnea    on exertion   Dysrhythmia    Atrial Fibrillation   GERD (gastroesophageal reflux disease)    Heart murmur    Hyperlipidemia    Hypertension    Hypothyroidism    MI, old 2016   nonobstructive CAD by Cath   Morbid obesity (HCC)    Persistent atrial fibrillation (HCC)    Pneumonia 06/2018   and RSV   Psoriasis    Sleep apnea    compliant with CPAP   Thyroid  disease     Past Surgical History:  Procedure Laterality Date   ARTERY BIOPSY Right 07/21/2017   Procedure: BIOPSY TEMPORAL ARTERY;  Surgeon: Jama Cordella MATSU, MD;  Location: [facility];  Service: Vascular;  Laterality: Right;   CARDIAC CATHETERIZATION     CARDIOVERSION Right 09/01/2016   Procedure: Cardioversion;  Surgeon: Fernand Denyse LABOR, MD;  Location: [facility];  Service: Cardiovascular;  Laterality: Right;   CARDIOVERSION N/A 09/09/2016   Procedure: Cardioversion;  Surgeon: Fernand Denyse LABOR, MD;  Location: [facility];  Service: Cardiovascular;  Laterality: N/A;   CATARACT EXTRACTION W/PHACO Right 03/26/2019   Procedure: CATARACT EXTRACTION PHACO AND INTRAOCULAR LENS PLACEMENT (IOC) RIGHT 6.45, 00:39.9;  Surgeon: Jaye Fallow, MD;  Location: [facility];  Service: Ophthalmology;  Laterality: Right;   CATARACT EXTRACTION W/PHACO Left 04/16/2019    Procedure: CATARACT EXTRACTION PHACO AND INTRAOCULAR LENS PLACEMENT (IOC) LEFT;   3.14, 00:24.9;  Surgeon: Jaye Fallow, MD;  Location: [facility];  Service: Ophthalmology;  Laterality: Left;  sleep apnea-CPAP   COLONOSCOPY WITH PROPOFOL  N/A 08/28/2019   Procedure: COLONOSCOPY WITH PROPOFOL ;  Surgeon: Toledo, Ladell POUR, MD;  Location: [facility];  Service: Gastroenterology;  Laterality: N/A;   ELECTROPHYSIOLOGIC STUDY N/A 02/01/2016   Procedure: CARDIOVERSION;  Surgeon: Denyse LABOR Fernand, MD;  Location: [facility];  Service: Cardiovascular;  Laterality: N/A;   ESOPHAGEAL DILATION     ESOPHAGOGASTRODUODENOSCOPY (EGD) WITH PROPOFOL  N/A 02/06/2017   Procedure: ESOPHAGOGASTRODUODENOSCOPY (EGD) WITH PROPOFOL ;  Surgeon: Therisa Bi, MD;  Location: [facility];  Service: Gastroenterology;  Laterality: N/A;   ESOPHAGOGASTRODUODENOSCOPY (EGD) WITH PROPOFOL  N/A 08/28/2019   Procedure: ESOPHAGOGASTRODUODENOSCOPY (EGD) WITH PROPOFOL ;  Surgeon: Toledo, Ladell POUR, MD;  Location: [facility];  Service: Gastroenterology;  Laterality: N/A;   EUS N/A 02/16/2017   Procedure: FULL UPPER ENDOSCOPIC ULTRASOUND (EUS) RADIAL;  Surgeon: Elta Fonda SQUIBB, MD;  Location: [facility];  Service: Gastroenterology;  Laterality: N/A;   LEFT HEART CATH AND CORONARY ANGIOGRAPHY N/A 09/08/2016   Procedure: Left Heart Cath and Coronary Angiography;  Surgeon: Fernand Denyse LABOR, MD;  Location: [facility];  Service: Cardiovascular;  Laterality: N/A;   US  ECHOCARDIOGRAPHY  Social History   Socioeconomic History   Marital status: Divorced    Spouse name: Not on file   Number of children: Not on file   Years of education: Not on file   Highest education level: Not on file  Occupational History   Not on file  Tobacco Use   Smoking status: Former    Current packs/day: 0.00    Types: Cigarettes    Quit date: 02/01/2013    Years since quitting: 10.6   Smokeless tobacco: Never  Vaping Use   Vaping  status: Never Used  Substance and Sexual Activity   Alcohol use: No   Drug use: No   Sexual activity: Not on file  Other Topics Concern   Not on file  Social History Narrative   Lives in [place] with multiple family members   unemployed   Social Drivers of Corporate investment banker Strain: Low Risk  (09/05/2023)   Received from [facility]   Overall Financial Resource Strain (CARDIA)    Difficulty of Paying Living Expenses: Not very hard  Food Insecurity: No Food Insecurity (09/05/2023)   Received from [facility]   Hunger Vital Sign    Within the past 12 months, you worried that your food would run out before you got the money to buy more.: Never true    Within the past 12 months, the food you bought just didn't last and you didn't have money to get more.: Never true  Transportation Needs: No Transportation Needs (09/05/2023)   Received from [facility]    In the past 12 months, has lack of transportation kept you from medical appointments or from getting medications?: No    Lack of Transportation (Non-Medical): No  Physical Activity: Not on file  Stress: Not on file  Social Connections: Moderately Integrated (08/05/2023)   Social Connection and Isolation Panel    Frequency of Communication with Friends and Family: More than three times a week    Frequency of Social Gatherings with Friends and Family: Twice a week    Attends Religious Services: More than 4 times per year    Active Member of [employer] or Organizations: Yes    Attends Engineer, structural: More than 4 times per year    Marital Status: Divorced  Intimate Partner Violence: Not At Risk (08/05/2023)   Humiliation, Afraid, Rape, and Kick questionnaire    Fear of Current or Ex-Partner: No    Emotionally Abused: No    Physically Abused: No    Sexually Abused: No    Family History  Problem Relation Age of Onset   Breast cancer  Paternal Aunt    Other Father    Heart attack Father     Allergies  Allergen Reactions   Codeine Hives, Itching and Rash   Other Itching and Other (See Comments)    States antibiotic in the past caused itching but can not remember name    Review of Systems  All other systems reviewed and are negative.      Objective:   BP 117/82   Pulse 74   Ht 5' 4 (1.626 m)   Wt (!) 301 lb (136.5 kg)   SpO2 96%   BMI 51.67 kg/m   Vitals:   10/11/23 1445  BP: 117/82  Pulse: 74  Height: 5' 4 (1.626 m)  Weight: (!) 301 lb (136.5 kg)  SpO2: 96%  BMI (Calculated): 51.64  Physical Exam Vitals and nursing note reviewed.  Constitutional:      Appearance: Normal appearance. She is normal weight.  HENT:     Head: Normocephalic.  Eyes:     Extraocular Movements: Extraocular movements intact.     Conjunctiva/sclera: Conjunctivae normal.     Pupils: Pupils are equal, round, and reactive to light.  Cardiovascular:     Rate and Rhythm: Normal rate.  Pulmonary:     Effort: Pulmonary effort is normal.  Neurological:     General: No focal deficit present.     Mental Status: She is alert and oriented to person, place, and time. Mental status is at baseline.  Psychiatric:        Mood and Affect: Mood normal.        Behavior: Behavior normal.        Thought Content: Thought content normal.        Judgment: Judgment normal.      No results found for any visits on 10/11/23.  Recent Results (from the past 2160 hours)  Basic metabolic panel     Status: Abnormal   Collection Time: 08/05/23  9:47 AM  Result Value Ref Range   Sodium 137 135 - 145 mmol/L   Potassium 3.9 3.5 - 5.1 mmol/L   Chloride 102 98 - 111 mmol/L   CO2 25 22 - 32 mmol/L   Glucose, Bld 122 (H) 70 - 99 mg/dL    Comment: Glucose reference range applies only to samples taken after fasting for at least 8 hours.   BUN 13 8 - 23 mg/dL   Creatinine, Ser 9.03 0.44 - 1.00 mg/dL   Calcium  8.3 (L) 8.9 - 10.3 mg/dL    GFR, Estimated >39 >39 mL/min    Comment: (NOTE) Calculated using the CKD-EPI Creatinine Equation (2021)    Anion gap 10 5 - 15    Comment: Performed at [facility], [address] [place], [place] [address]  CBC     Status: None   Collection Time: 08/05/23  9:47 AM  Result Value Ref Range   WBC 8.1 4.0 - 10.5 K/uL   RBC 4.88 3.87 - 5.11 MIL/uL   Hemoglobin 14.0 12.0 - 15.0 g/dL   HCT 57.9 63.9 - 53.9 %   MCV 86.1 80.0 - 100.0 fL   MCH 28.7 26.0 - 34.0 pg   MCHC 33.3 30.0 - 36.0 g/dL   RDW 87.0 88.4 - 84.4 %   Platelets 269 150 - 400 K/uL   nRBC 0.0 0.0 - 0.2 %    Comment: Performed at [facility], [address] [place], [place] [address]  Troponin I (High Sensitivity)     Status: Abnormal   Collection Time: 08/05/23  9:47 AM  Result Value Ref Range   Troponin I (High Sensitivity) 19 (H) <18 ng/L    Comment: (NOTE) Elevated high sensitivity troponin I (hsTnI) values and significant  changes across serial measurements Hoelzel suggest ACS but many other  chronic and acute conditions are known to elevate hsTnI results.  Refer to the Links section for chest pain algorithms and additional  guidance. Performed at [facility], [address] [place], [place] [address]   Brain natriuretic peptide     Status: Abnormal   Collection Time: 08/05/23  9:47 AM  Result Value Ref Range   B Natriuretic Peptide 463.7 (H) 0.0 - 100.0 pg/mL    Comment: Performed at [facility], [address], [place], [place] [address]  Troponin I (High Sensitivity)     Status: Abnormal   Collection Time: 08/05/23 10:59 AM  Result Value Ref Range   Troponin I (High Sensitivity) 18 (H) <18 ng/L    Comment: (NOTE) Elevated high sensitivity troponin I (hsTnI) values and significant  changes across serial measurements Dano suggest ACS but many other  chronic and acute conditions are known to elevate hsTnI results.  Refer to the Links section for chest pain  algorithms and additional  guidance. Performed at [facility], [address] [place], [place] [address]   Resp panel by RT-PCR (RSV, Flu A&B, Covid) Anterior Nasal Swab     Status: None   Collection Time: 08/05/23 10:59 AM   Specimen: Anterior Nasal Swab  Result Value Ref Range   SARS Coronavirus 2 by RT PCR NEGATIVE NEGATIVE    Comment: (NOTE) SARS-CoV-2 target nucleic acids are NOT DETECTED.  The SARS-CoV-2 RNA is generally detectable in upper respiratory specimens during the acute phase of infection. The lowest concentration of SARS-CoV-2 viral copies this assay can detect is 138 copies/mL. A negative result does not preclude SARS-Cov-2 infection and should not be used as the sole basis for treatment or other patient management decisions. A negative result Lipscomb occur with  improper specimen collection/handling, submission of specimen other than nasopharyngeal swab, presence of viral mutation(s) within the areas targeted by this assay, and inadequate number of viral copies(<138 copies/mL). A negative result must be combined with clinical observations, patient history, and epidemiological information. The expected result is Negative.  Fact Sheet for Patients:  BloggerCourse.com  Fact Sheet for Healthcare Providers:  SeriousBroker.it  This test is no t yet approved or cleared by the [place]  FDA and  has been authorized for detection and/or diagnosis of SARS-CoV-2 by FDA under an Emergency Use Authorization (EUA). This EUA will remain  in effect (meaning this test can be used) for the duration of the COVID-19 declaration under Section 564(b)(1) of the Act, 21 U.S.C.section 360bbb-3(b)(1), unless the authorization is terminated  or revoked sooner.       Influenza A by PCR NEGATIVE NEGATIVE   Influenza B by PCR NEGATIVE NEGATIVE    Comment: (NOTE) The Xpert Xpress SARS-CoV-2/FLU/RSV plus assay is intended as  an aid in the diagnosis of influenza from Nasopharyngeal swab specimens and should not be used as a sole basis for treatment. Nasal washings and aspirates are unacceptable for Xpert Xpress SARS-CoV-2/FLU/RSV testing.  Fact Sheet for Patients: BloggerCourse.com  Fact Sheet for Healthcare Providers: SeriousBroker.it  This test is not yet approved or cleared by the [place]  FDA and has been authorized for detection and/or diagnosis of SARS-CoV-2 by FDA under an Emergency Use Authorization (EUA). This EUA will remain in effect (meaning this test can be used) for the duration of the COVID-19 declaration under Section 564(b)(1) of the Act, 21 U.S.C. section 360bbb-3(b)(1), unless the authorization is terminated or revoked.     Resp Syncytial Virus by PCR NEGATIVE NEGATIVE    Comment: (NOTE) Fact Sheet for Patients: BloggerCourse.com  Fact Sheet for Healthcare Providers: SeriousBroker.it  This test is not yet approved or cleared by the [place]  FDA and has been authorized for detection and/or diagnosis of SARS-CoV-2 by FDA under an Emergency Use Authorization (EUA). This EUA will remain in effect (meaning this test can be used) for the duration of the COVID-19 declaration under Section 564(b)(1) of the Act, 21 U.S.C. section 360bbb-3(b)(1), unless the authorization is terminated or revoked.  Performed at [facility], [address] [place], [place] [address]   HIV Antibody (routine testing w rflx)     Status: None   Collection Time: 08/06/23  3:22 AM  Result Value Ref Range   HIV Screen 4th Generation wRfx Non Reactive Non Reactive    Comment: Performed at [facility], [address], [place], [place] [address]  CBC     Status: None   Collection Time: 08/06/23  3:22 AM  Result Value Ref Range   WBC 8.2 4.0 - 10.5 K/uL   RBC 4.57 3.87 - 5.11 MIL/uL    Hemoglobin 13.0 12.0 - 15.0 g/dL   HCT 61.4 63.9 - 53.9 %   MCV 84.2 80.0 - 100.0 fL   MCH 28.4 26.0 - 34.0 pg   MCHC 33.8 30.0 - 36.0 g/dL   RDW 87.0 88.4 - 84.4 %   Platelets 276 150 - 400 K/uL   nRBC 0.0 0.0 - 0.2 %    Comment: Performed at [facility], [address] [place], [place] [address]  Comprehensive metabolic panel     Status: Abnormal   Collection Time: 08/06/23  3:22 AM  Result Value Ref Range   Sodium 135 135 - 145 mmol/L   Potassium 3.7 3.5 - 5.1 mmol/L   Chloride 99 98 - 111 mmol/L   CO2 26 22 - 32 mmol/L   Glucose, Bld 229 (H) 70 - 99 mg/dL    Comment: Glucose reference range applies only to samples taken after fasting for at least 8 hours.   BUN 20 8 - 23 mg/dL   Creatinine, Ser 8.91 (H) 0.44 - 1.00 mg/dL   Calcium  8.4 (L) 8.9 - 10.3 mg/dL   Total Protein 6.4 (L) 6.5 - 8.1 g/dL   Albumin 3.3 (L) 3.5 - 5.0 g/dL   AST 23 15 - 41 U/L   ALT 16 0 - 44 U/L   Alkaline Phosphatase 57 38 - 126 U/L   Total Bilirubin 0.7 0.0 - 1.2 mg/dL   GFR, Estimated 53 (L) >60 mL/min    Comment: (NOTE) Calculated using the CKD-EPI Creatinine Equation (2021)    Anion gap 10 5 - 15    Comment: Performed at [facility], [address] [place], [place] [address]  Hemoglobin A1c     Status: None   Collection Time: 08/06/23  3:22 AM  Result Value Ref Range   Hgb A1c MFr Bld 5.3 4.8 - 5.6 %    Comment: (NOTE) Pre diabetes:          5.7%-6.4%  Diabetes:              >6.4%  Glycemic control for   <7.0% adults with diabetes    Mean Plasma Glucose 105.41 mg/dL    Comment: Performed at [facility], [address], [place], [place] [address]  Glucose, capillary     Status: Abnormal   Collection Time: 08/06/23 12:18 PM  Result Value Ref Range   Glucose-Capillary 169 (H) 70 - 99 mg/dL    Comment: Glucose reference range applies only to samples taken after fasting for at least 8 hours.  Glucose, capillary     Status: Abnormal   Collection Time: 08/06/23   4:08 PM  Result Value Ref Range   Glucose-Capillary 166 (H) 70 - 99 mg/dL    Comment: Glucose reference range applies only to samples taken after fasting for at least 8 hours.  Glucose, capillary     Status: Abnormal  Collection Time: 08/06/23  8:01 PM  Result Value Ref Range   Glucose-Capillary 126 (H) 70 - 99 mg/dL    Comment: Glucose reference range applies only to samples taken after fasting for at least 8 hours.  Basic metabolic panel     Status: Abnormal   Collection Time: 08/07/23  5:03 AM  Result Value Ref Range   Sodium 137 135 - 145 mmol/L   Potassium 3.7 3.5 - 5.1 mmol/L   Chloride 98 98 - 111 mmol/L   CO2 31 22 - 32 mmol/L   Glucose, Bld 110 (H) 70 - 99 mg/dL    Comment: Glucose reference range applies only to samples taken after fasting for at least 8 hours.   BUN 34 (H) 8 - 23 mg/dL   Creatinine, Ser 8.83 (H) 0.44 - 1.00 mg/dL   Calcium  8.5 (L) 8.9 - 10.3 mg/dL   GFR, Estimated 49 (L) >60 mL/min    Comment: (NOTE) Calculated using the CKD-EPI Creatinine Equation (2021)    Anion gap 8 5 - 15    Comment: Performed at [facility], [address] [place], [place] [address]  Magnesium      Status: None   Collection Time: 08/07/23  5:03 AM  Result Value Ref Range   Magnesium  2.2 1.7 - 2.4 mg/dL    Comment: Performed at [facility], [address] [place], [place] [address]  Glucose, capillary     Status: Abnormal   Collection Time: 08/07/23  7:59 AM  Result Value Ref Range   Glucose-Capillary 114 (H) 70 - 99 mg/dL    Comment: Glucose reference range applies only to samples taken after fasting for at least 8 hours.  Glucose, capillary     Status: None   Collection Time: 08/07/23 11:23 AM  Result Value Ref Range   Glucose-Capillary 96 70 - 99 mg/dL    Comment: Glucose reference range applies only to samples taken after fasting for at least 8 hours.  Lipid panel     Status: Abnormal   Collection Time: 08/17/23 10:44 AM  Result Value Ref Range    Cholesterol, Total 111 100 - 199 mg/dL   Triglycerides 823 (H) 0 - 149 mg/dL   HDL 32 (L) >60 mg/dL   VLDL Cholesterol Cal 29 5 - 40 mg/dL   LDL Chol Calc (NIH) 50 0 - 99 mg/dL   Chol/HDL Ratio 3.5 0.0 - 4.4 ratio    Comment:                                   T. Chol/HDL Ratio                                             Men  Women                               1/2 Avg.Risk  3.4    3.3                                   Avg.Risk  5.0    4.4  2X Avg.Risk  9.6    7.1                                3X Avg.Risk 23.4   11.0   VITAMIN D  25 Hydroxy (Vit-D Deficiency, Fractures)     Status: None   Collection Time: 08/17/23 10:44 AM  Result Value Ref Range   Vit D, 25-Hydroxy 37.0 30.0 - 100.0 ng/mL    Comment: Vitamin D  deficiency has been defined by the Institute of Medicine and an Endocrine Society practice guideline as a level of serum 25-OH vitamin D  less than 20 ng/mL (1,2). The Endocrine Society went on to further define vitamin D  insufficiency as a level between 21 and 29 ng/mL (2). 1. IOM (Institute of Medicine). 2010. Dietary reference    intakes for calcium  and D. [place]: The    [employer]. 2. Holick MF, Binkley Bluefield, Bischoff-Ferrari HA, et al.    Evaluation, treatment, and prevention of vitamin D     deficiency: an Endocrine Society clinical practice    guideline. JCEM. 2011 Jul; 96(7):1911-30.   CMP14+EGFR     Status: Abnormal   Collection Time: 08/17/23 10:44 AM  Result Value Ref Range   Glucose 157 (H) 70 - 99 mg/dL   BUN 12 8 - 27 mg/dL   Creatinine, Ser 9.03 0.57 - 1.00 mg/dL   eGFR 61 >40 fO/fpw/8.26   BUN/Creatinine Ratio 13 12 - 28   Sodium 144 134 - 144 mmol/L   Potassium 4.7 3.5 - 5.2 mmol/L   Chloride 101 96 - 106 mmol/L   CO2 18 (L) 20 - 29 mmol/L   Calcium  10.1 8.7 - 10.3 mg/dL   Total Protein 7.4 6.0 - 8.5 g/dL   Albumin 4.9 (H) 3.8 - 4.8 g/dL   Globulin, Total 2.5 1.5 - 4.5 g/dL   Bilirubin Total 0.4 0.0  - 1.2 mg/dL   Alkaline Phosphatase 82 44 - 121 IU/L   AST 32 0 - 40 IU/L   ALT 39 (H) 0 - 32 IU/L  Hemoglobin A1c     Status: Abnormal   Collection Time: 08/17/23 10:44 AM  Result Value Ref Range   Hgb A1c MFr Bld 6.1 (H) 4.8 - 5.6 %    Comment:          Prediabetes: 5.7 - 6.4          Diabetes: >6.4          Glycemic control for adults with diabetes: <7.0    Est. average glucose Bld gHb Est-mCnc 128 mg/dL  Vitamin B12     Status: None   Collection Time: 08/17/23 10:44 AM  Result Value Ref Range   Vitamin B-12 459 232 - 1,245 pg/mL  Iron, TIBC and Ferritin Panel     Status: Abnormal   Collection Time: 08/17/23 10:44 AM  Result Value Ref Range   Total Iron Binding Capacity 323 250 - 450 ug/dL   UIBC 754 881 - 630 ug/dL   Iron 78 27 - 860 ug/dL   Iron Saturation 24 15 - 55 %   Ferritin 255 (H) 15 - 150 ng/mL  CBC with Diff     Status: None   Collection Time: 08/17/23 10:44 AM  Result Value Ref Range   WBC 8.1 3.4 - 10.8 x10E3/uL   RBC 5.02 3.77 - 5.28 x10E6/uL   Hemoglobin 14.2 11.1 - 15.9 g/dL   Hematocrit 55.8 65.9 -  46.6 %   MCV 88 79 - 97 fL   MCH 28.3 26.6 - 33.0 pg   MCHC 32.2 31.5 - 35.7 g/dL   RDW 86.3 88.2 - 84.5 %   Platelets 303 150 - 450 x10E3/uL   Neutrophils 68 Not Estab. %   Lymphs 20 Not Estab. %   Monocytes 8 Not Estab. %   Eos 2 Not Estab. %   Basos 1 Not Estab. %   Neutrophils Absolute 5.5 1.4 - 7.0 x10E3/uL   Lymphocytes Absolute 1.6 0.7 - 3.1 x10E3/uL   Monocytes Absolute 0.6 0.1 - 0.9 x10E3/uL   EOS (ABSOLUTE) 0.2 0.0 - 0.4 x10E3/uL   Basophils Absolute 0.1 0.0 - 0.2 x10E3/uL   Immature Granulocytes 1 Not Estab. %   Immature Grans (Abs) 0.1 0.0 - 0.1 x10E3/uL  TSH+T4F+T3Free     Status: None   Collection Time: 08/17/23 10:44 AM  Result Value Ref Range   TSH 2.060 0.450 - 4.500 uIU/mL   T3, Free 3.2 2.0 - 4.4 pg/mL   Free T4 1.19 0.82 - 1.77 ng/dL       Assessment & Plan:   Assessment & Plan Mild cognitive impairment     No  follow-ups on file.   Total time spent: {AMA time spent:29001} minutes  ALAN CHRISTELLA ARRANT, FNP  10/11/2023   This document S[place] have been prepared by [facility] Voice Recognition software and as such [place] include unintentional dictation errors.

## 2023-10-16 NOTE — Unmapped (Signed)
 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 Break for 1-2 weeks. Restart as needed.  - Start clobetasol  (TEMOVATE ) 0.05 % external solution; Apply twice daily to affected areas on scalp with itching/flaking/rash until smooth/clear, then stop. Restart as needed.  Dispense: 50 mL; Refill: 5  - Restart ixekizumab  (TALTZ  AUTOINJECTOR) 80 mg/mL AtIn auto-injector; Inject 1 mL (80 mg total) under the skin every twenty-eight (28) days. Patient has only been off of medication for 1 month; loading dose not required again at this time.     High risk medication use  - quant gold negative 08/2022; re-ordered today   - hep panel WNL 02/2017    The patient was advised to call for an appointment should any new, changing, or symptomatic lesions develop.     RTC: Return in about 1 year (around 10/16/2024). or sooner as needed   _________________________________________________________________      Chief Complaint     Chief Complaint   Patient presents with    Medication Refill     Pt needs refill due to insurance switch. (TALTZ  AUTOINJECTOR) 80 mg/mL       HPI     Lisa Carlson is a 78 y.o. female who presents as a returning patient (last seen 08/31/2022) to Dermatology for follow up of psoriasis.     Patient reports significant improvement on taltz  but has recently started to flare in the setting of transitioning insurance. She has not been able to receive her next dose of taltz . She has been off of medication for 1 month. She uses topicals as needed for flares that are occurring.     The patient denies any other new or changing lesions or areas of concern.     Pertinent Past Medical History       Problem List       Psoriasis - Primary    Relevant Medications    clobetasol  (TEMOVATE ) 0.05 % external solution    ixekizumab  (TALTZ  AUTOINJECTOR) 80 mg/mL AtIn auto-injector    clobetasol  (TEMOVATE ) 0.05 % external solution    Other Relevant Orders    Quantiferon TB Gold Plus         Past Medical History, Family History, Social History, Medication List, Allergies, and Problem List were reviewed in the rooming section of Epic.     ROS: Other than symptoms mentioned in the HPI, no fevers, chills, or other skin complaints    Physical Examination     GENERAL: Well-appearing female in no acute distress, resting comfortably.  NEURO: Alert and oriented, answers questions appropriately  PSYCH: Normal mood and affect  SKIN: Examination of the hair, scalp, face, neck, chest, bilateral upper extremities, and bilateral lower extremities was performed  - erythematous plaques with overlying scale of the bilateral elbows, scalp      All areas not commented on are within normal limits or unremarkable.           (Approved Template 12/23/2019)

## 2023-10-17 DIAGNOSIS — L409 Psoriasis, unspecified: Principal | ICD-10-CM

## 2023-10-17 DIAGNOSIS — Z79899 Other long term (current) drug therapy: Principal | ICD-10-CM

## 2023-10-17 MED ORDER — IXEKIZUMAB 80 MG/ML SUBCUTANEOUS AUTO-INJECTOR
SUBCUTANEOUS | 0.00000 days | Status: CN
Start: 2023-10-17 — End: ?

## 2023-10-17 MED ORDER — CLOBETASOL 0.05 % SCALP SOLUTION
TOPICAL | 5 refills | 0.00000 days | Status: CP
Start: 2023-10-17 — End: 2023-10-17

## 2023-10-17 MED ORDER — TALTZ AUTOINJECTOR 80 MG/ML SUBCUTANEOUS
SUBCUTANEOUS | 11 refills | 28.00000 days | Status: CP
Start: 2023-10-17 — End: ?
  Filled 2023-10-23: qty 1, 28d supply, fill #0

## 2023-10-17 NOTE — Unmapped (Signed)
 Meet your team:     Your intake nurse is: Cicero Duck    Please remember to fill out the survey you will receive after your visit. Your comments help [place] continue to improve our care.      Thanks in advance!      [facility] Dermatology Clinical Staff

## 2023-10-19 LAB — QUANTIFERON TB GOLD PLUS
QUANTIFERON MITOGEN VALUE: >10 [IU]/mL
QUANTIFERON NIL VALUE: 0.02 [IU]/mL
QUANTIFERON TB GOLD: NEGATIVE
QUANTIFERON TB1 AG VALUE: 0.03 [IU]/mL
QUANTIFERON TB2 AG VALUE: 0.04 [IU]/mL

## 2023-10-20 DIAGNOSIS — R0602 Shortness of breath: Secondary | ICD-10-CM | POA: Diagnosis not present

## 2023-10-20 DIAGNOSIS — K219 Gastro-esophageal reflux disease without esophagitis: Secondary | ICD-10-CM | POA: Diagnosis not present

## 2023-10-20 DIAGNOSIS — J449 Chronic obstructive pulmonary disease, unspecified: Secondary | ICD-10-CM | POA: Diagnosis not present

## 2023-10-20 DIAGNOSIS — G4733 Obstructive sleep apnea (adult) (pediatric): Secondary | ICD-10-CM | POA: Diagnosis not present

## 2023-10-20 DIAGNOSIS — Z87891 Personal history of nicotine dependence: Secondary | ICD-10-CM | POA: Diagnosis not present

## 2023-10-20 NOTE — Unmapped (Signed)
 [facility] Refill Coordination Note    Specialty Medication(s) to be Shipped:   Inflammatory Disorders: Taltz     Other medication(s) to be shipped: No additional medications requested for fill at this time     Lisa Carlson, DOB: 1945-07-12  Phone: [phone] (home)       All above HIPAA information was verified with patient.     Was a Nurse, learning disability used for this call? No    Completed refill call assessment today to schedule patient's medication shipment from the [facility]  [phone]).  All relevant notes have been reviewed.     Specialty medication(s) and dose(s) confirmed: Regimen is correct and unchanged.   Changes to medications: [name] reports no changes at this time.  Changes to insurance: No  New side effects reported not previously addressed with a pharmacist or physician: None reported  Questions for the pharmacist: No    Confirmed patient received a Conservation officer, historic buildings and a Surveyor, mining with first shipment. The patient will receive a drug information handout for each medication shipped and additional FDA Medication Guides as required.       DISEASE/MEDICATION-SPECIFIC INFORMATION        For patients on injectable medications: Patient currently has 0 doses left.  Next injection is scheduled for 10/23/23.    SPECIALTY MEDICATION ADHERENCE     Medication Adherence    Patient reported X missed doses in the last month: 0  Specialty Medication: ixekizumab  (TALTZ  AUTOINJECTOR) 80 mg/mL AtIn auto-injector  Patient is on additional specialty medications: No              Were doses missed due to medication being on hold? No    TALTZ  AUTOINJECTOR 80 mg/mL Atin auto-injector (ixekizumab ): 0 doses of medicine on hand       REFERRAL TO PHARMACIST     Referral to the pharmacist: Not needed      [facility]     Shipping address confirmed in Epic.     Cost and Payment: Patient has a $0 copay, payment information is not required.    Delivery Scheduled: Yes, Expected medication delivery date: 10/23/23.     Medication will be delivered via Same Day Courier to the prescription address in Epic WAM.    [name]   [facility]  Specialty Technician

## 2023-10-23 DIAGNOSIS — M25561 Pain in right knee: Secondary | ICD-10-CM | POA: Diagnosis not present

## 2023-10-23 DIAGNOSIS — M25562 Pain in left knee: Secondary | ICD-10-CM | POA: Diagnosis not present

## 2023-10-23 DIAGNOSIS — G8929 Other chronic pain: Secondary | ICD-10-CM | POA: Diagnosis not present

## 2023-10-24 ENCOUNTER — Other Ambulatory Visit: Payer: Self-pay | Admitting: Specialist

## 2023-10-24 DIAGNOSIS — Z87891 Personal history of nicotine dependence: Secondary | ICD-10-CM

## 2023-10-24 NOTE — Unmapped (Signed)
 Call from patient stating has no more room in sharp container.  Only way to receive another sharp container is to have another Rx for Taltz  sent to pharmacy and they will send another sharps container.  Noted Rx was sent in on 10/17/23.

## 2023-10-26 MED ORDER — EMPTY CONTAINER
2 refills | 0.00000 days
Start: 2023-10-26 — End: ?

## 2023-11-01 ENCOUNTER — Ambulatory Visit: Admitting: Family

## 2023-11-01 ENCOUNTER — Encounter: Payer: Self-pay | Admitting: Family

## 2023-11-01 VITALS — BP 124/82 | HR 64 | Ht 64.0 in | Wt 300.4 lb

## 2023-11-01 DIAGNOSIS — J069 Acute upper respiratory infection, unspecified: Secondary | ICD-10-CM

## 2023-11-01 DIAGNOSIS — J029 Acute pharyngitis, unspecified: Secondary | ICD-10-CM | POA: Diagnosis not present

## 2023-11-01 DIAGNOSIS — R7303 Prediabetes: Secondary | ICD-10-CM

## 2023-11-01 DIAGNOSIS — Z0130 Encounter for examination of blood pressure without abnormal findings: Secondary | ICD-10-CM

## 2023-11-01 LAB — POC INFLUENZA A&B (BINAX/QUICKVUE)
Influenza A, POC: NEGATIVE
Influenza B, POC: NEGATIVE

## 2023-11-01 LAB — POCT RAPID STREP A (OFFICE): Rapid Strep A Screen: NEGATIVE

## 2023-11-01 MED ORDER — HYDROCOD POLI-CHLORPHE POLI ER 10-8 MG/5ML PO SUER: 5.0000 mL | Freq: Two times a day (BID) | ORAL | 0 refills | Status: DC | PRN

## 2023-11-01 NOTE — Progress Notes (Signed)
 Acute Office Visit  Subjective:     Patient ID: Patricia Walker, female    DOB: 08/21/1945, 78 y.o.   MRN: [id number]  Patient is in today for  Chief Complaint  Patient presents with   Acute Visit    Pt symptoms started Sunday and ear pain started last night.     URI  This is a new problem. The current episode started in the past 7 days. There has been no fever. Associated symptoms include congestion, coughing, ear pain, rhinorrhea, sinus pain, a sore throat and wheezing. Chest pain: q.    Review of Systems  Constitutional:  Positive for malaise/fatigue.  HENT:  Positive for congestion, ear pain, rhinorrhea, sinus pain and sore throat.   Respiratory:  Positive for cough and wheezing.   Cardiovascular:  Chest pain: q.        Objective:    BP 124/82   Pulse 64   Ht 5' 4 (1.626 m)   Wt (!) 300 lb 6.4 oz (136.3 kg)   SpO2 96%   BMI 51.56 kg/m   Physical Exam Vitals and nursing note reviewed.  Constitutional:      General: She is not in acute distress.    Appearance: Normal appearance. She is obese.  HENT:     Head: Normocephalic.     Nose: Congestion and rhinorrhea present.  Eyes:     Extraocular Movements: Extraocular movements intact.     Conjunctiva/sclera: Conjunctivae normal.     Pupils: Pupils are equal, round, and reactive to light.  Cardiovascular:     Rate and Rhythm: Normal rate.  Pulmonary:     Effort: Pulmonary effort is normal.     Breath sounds: Wheezing present.  Neurological:     General: No focal deficit present.     Mental Status: She is alert and oriented to person, place, and time. Mental status is at baseline.  Psychiatric:        Mood and Affect: Mood normal.        Behavior: Behavior normal.        Thought Content: Thought content normal.        Judgment: Judgment normal.     Results for orders placed or performed in visit on 11/01/23  COVID-19, Flu A+B and RSV  Result Value Ref Range   SARS-CoV-2, NAA Not Detected Not Detected    Influenza A, NAA Not Detected Not Detected   Influenza B, NAA Not Detected Not Detected   RSV, NAA Not Detected Not Detected   Test Information: Comment   POCT rapid strep A  Result Value Ref Range   Rapid Strep A Screen Negative Negative  POC Influenza A&B (Binax test)  Result Value Ref Range   Influenza A, POC Negative Negative   Influenza B, POC Negative Negative    Recent Results (from the past 2160 hours)  Lipid panel     Status: Abnormal   Collection Time: 08/17/23 10:44 AM  Result Value Ref Range   Cholesterol, Total 111 100 - 199 mg/dL   Triglycerides 823 (H) 0 - 149 mg/dL   HDL 32 (L) >60 mg/dL   VLDL Cholesterol Cal 29 5 - 40 mg/dL   LDL Chol Calc (NIH) 50 0 - 99 mg/dL   Chol/HDL Ratio 3.5 0.0 - 4.4 ratio    Comment:  T. Chol/HDL Ratio                                             Men  Women                               1/2 Avg.Risk  3.4    3.3                                   Avg.Risk  5.0    4.4                                2X Avg.Risk  9.6    7.1                                3X Avg.Risk 23.4   11.0   VITAMIN D  25 Hydroxy (Vit-D Deficiency, Fractures)     Status: None   Collection Time: 08/17/23 10:44 AM  Result Value Ref Range   Vit D, 25-Hydroxy 37.0 30.0 - 100.0 ng/mL    Comment: Vitamin D  deficiency has been defined by the Institute of Medicine and an Endocrine Society practice guideline as a level of serum 25-OH vitamin D  less than 20 ng/mL (1,2). The Endocrine Society went on to further define vitamin D  insufficiency as a level between 21 and 29 ng/mL (2). 1. IOM (Institute of Medicine). 2010. Dietary reference    intakes for calcium  and D. [place]: The    [employer]. 2. Holick MF, Binkley [place], Bischoff-Ferrari HA, et al.    Evaluation, treatment, and prevention of vitamin D     deficiency: an Endocrine Society clinical practice    guideline. JCEM. 2011 Jul; 96(7):1911-30.   CMP14+EGFR      Status: Abnormal   Collection Time: 08/17/23 10:44 AM  Result Value Ref Range   Glucose 157 (H) 70 - 99 mg/dL   BUN 12 8 - 27 mg/dL   Creatinine, Ser 9.03 0.57 - 1.00 mg/dL   eGFR 61 >40 fO/fpw/8.26   BUN/Creatinine Ratio 13 12 - 28   Sodium 144 134 - 144 mmol/L   Potassium 4.7 3.5 - 5.2 mmol/L   Chloride 101 96 - 106 mmol/L   CO2 18 (L) 20 - 29 mmol/L   Calcium  10.1 8.7 - 10.3 mg/dL   Total Protein 7.4 6.0 - 8.5 g/dL   Albumin 4.9 (H) 3.8 - 4.8 g/dL   Globulin, Total 2.5 1.5 - 4.5 g/dL   Bilirubin Total 0.4 0.0 - 1.2 mg/dL   Alkaline Phosphatase 82 44 - 121 IU/L   AST 32 0 - 40 IU/L   ALT 39 (H) 0 - 32 IU/L  Hemoglobin A1c     Status: Abnormal   Collection Time: 08/17/23 10:44 AM  Result Value Ref Range   Hgb A1c MFr Bld 6.1 (H) 4.8 - 5.6 %    Comment:          Prediabetes: 5.7 - 6.4          Diabetes: >6.4          Glycemic control for adults with diabetes: <7.0  Est. average glucose Bld gHb Est-mCnc 128 mg/dL  Vitamin B12     Status: None   Collection Time: 08/17/23 10:44 AM  Result Value Ref Range   Vitamin B-12 459 232 - 1,245 pg/mL  Iron, TIBC and Ferritin Panel     Status: Abnormal   Collection Time: 08/17/23 10:44 AM  Result Value Ref Range   Total Iron Binding Capacity 323 250 - 450 ug/dL   UIBC 754 881 - 630 ug/dL   Iron 78 27 - 860 ug/dL   Iron Saturation 24 15 - 55 %   Ferritin 255 (H) 15 - 150 ng/mL  CBC with Diff     Status: None   Collection Time: 08/17/23 10:44 AM  Result Value Ref Range   WBC 8.1 3.4 - 10.8 x10E3/uL   RBC 5.02 3.77 - 5.28 x10E6/uL   Hemoglobin 14.2 11.1 - 15.9 g/dL   Hematocrit 55.8 65.9 - 46.6 %   MCV 88 79 - 97 fL   MCH 28.3 26.6 - 33.0 pg   MCHC 32.2 31.5 - 35.7 g/dL   RDW 86.3 88.2 - 84.5 %   Platelets 303 150 - 450 x10E3/uL   Neutrophils 68 Not Estab. %   Lymphs 20 Not Estab. %   Monocytes 8 Not Estab. %   Eos 2 Not Estab. %   Basos 1 Not Estab. %   Neutrophils Absolute 5.5 1.4 - 7.0 x10E3/uL   Lymphocytes Absolute  1.6 0.7 - 3.1 x10E3/uL   Monocytes Absolute 0.6 0.1 - 0.9 x10E3/uL   EOS (ABSOLUTE) 0.2 0.0 - 0.4 x10E3/uL   Basophils Absolute 0.1 0.0 - 0.2 x10E3/uL   Immature Granulocytes 1 Not Estab. %   Immature Grans (Abs) 0.1 0.0 - 0.1 x10E3/uL  TSH+T4F+T3Free     Status: None   Collection Time: 08/17/23 10:44 AM  Result Value Ref Range   TSH 2.060 0.450 - 4.500 uIU/mL   T3, Free 3.2 2.0 - 4.4 pg/mL   Free T4 1.19 0.82 - 1.77 ng/dL  POCT rapid strep A     Status: None   Collection Time: 11/01/23  3:36 PM  Result Value Ref Range   Rapid Strep A Screen Negative Negative  POC Influenza A&B (Binax test)     Status: None   Collection Time: 11/01/23  3:37 PM  Result Value Ref Range   Influenza A, POC Negative Negative   Influenza B, POC Negative Negative  COVID-19, Flu A+B and RSV     Status: None   Collection Time: 11/01/23  4:22 PM  Result Value Ref Range   SARS-CoV-2, NAA Not Detected Not Detected   Influenza A, NAA Not Detected Not Detected   Influenza B, NAA Not Detected Not Detected   RSV, NAA Not Detected Not Detected   Test Information: Comment     Comment: This nucleic acid amplification test was developed and its performance characteristics determined by [employer]. Nucleic acid amplification tests include RT-PCR and TMA. This test has not been FDA cleared or approved. This test has been authorized by FDA under an Emergency Use Authorization (EUA). This test is only authorized for the duration of time the declaration that circumstances exist justifying the authorization of the emergency use of in vitro diagnostic tests for detection of SARS-CoV-2 virus and/or diagnosis of COVID-19 infection under section 564(b)(1) of the Act, 21 U.S.C. 639aaa-6(a) (1), unless the authorization is terminated or revoked sooner. When diagnostic testing is negative, the possibility of a false negative result should be  considered in the context of a patient's recent exposures and the  presence of clinical signs and symptoms consistent with COVID-19. An individual without symptoms of COVID-19 and who is not shedding SARS-CoV-2 virus wo uld expect to have a negative (not detected) result in this assay.   Basic metabolic panel     Status: Abnormal   Collection Time: 11/05/23  7:49 AM  Result Value Ref Range   Sodium 136 135 - 145 mmol/L   Potassium 3.8 3.5 - 5.1 mmol/L   Chloride 101 98 - 111 mmol/L   CO2 25 22 - 32 mmol/L   Glucose, Bld 133 (H) 70 - 99 mg/dL    Comment: Glucose reference range applies only to samples taken after fasting for at least 8 hours.   BUN 10 8 - 23 mg/dL   Creatinine, Ser 9.03 0.44 - 1.00 mg/dL   Calcium  8.7 (L) 8.9 - 10.3 mg/dL   GFR, Estimated >39 >39 mL/min    Comment: (NOTE) Calculated using the CKD-EPI Creatinine Equation (2021)    Anion gap 10 5 - 15    Comment: Performed at [facility], [address] [place], [place] [address]  CBC     Status: None   Collection Time: 11/05/23  7:49 AM  Result Value Ref Range   WBC 8.2 4.0 - 10.5 K/uL   RBC 5.07 3.87 - 5.11 MIL/uL   Hemoglobin 14.0 12.0 - 15.0 g/dL   HCT 56.6 63.9 - 53.9 %   MCV 85.4 80.0 - 100.0 fL   MCH 27.6 26.0 - 34.0 pg   MCHC 32.3 30.0 - 36.0 g/dL   RDW 86.6 88.4 - 84.4 %   Platelets 312 150 - 400 K/uL   nRBC 0.0 0.0 - 0.2 %    Comment: Performed at [facility], [address] [place], [place] [address]  Lipase, blood     Status: None   Collection Time: 11/05/23  7:49 AM  Result Value Ref Range   Lipase 23 11 - 51 U/L    Comment: Performed at [facility], [address] [place], [place] [address]  Brain natriuretic peptide     Status: Abnormal   Collection Time: 11/05/23  8:24 AM  Result Value Ref Range   B Natriuretic Peptide 241.8 (H) 0.0 - 100.0 pg/mL    Comment: Performed at [facility], [address] [place], [place] [address]  Magnesium      Status: None   Collection Time: 11/05/23  8:24 AM  Result Value Ref  Range   Magnesium  1.9 1.7 - 2.4 mg/dL    Comment: Performed at [facility], [address] [place], [place] [address]  Procalcitonin     Status: None   Collection Time: 11/05/23  8:24 AM  Result Value Ref Range   Procalcitonin <0.10 ng/mL    Comment:        Interpretation: PCT (Procalcitonin) <= 0.5 ng/mL: Systemic infection (sepsis) is not likely. Local bacterial infection is possible. (NOTE)       Sepsis PCT Algorithm           Lower Respiratory Tract                                      Infection PCT Algorithm    ----------------------------     ----------------------------         PCT < 0.25 ng/mL  PCT < 0.10 ng/mL          Strongly encourage             Strongly discourage   discontinuation of antibiotics    initiation of antibiotics    ----------------------------     -----------------------------       PCT 0.25 - 0.50 ng/mL            PCT 0.10 - 0.25 ng/mL               OR       >80% decrease in PCT            Discourage initiation of                                            antibiotics      Encourage discontinuation           of antibiotics    ----------------------------     -----------------------------         PCT >= 0.50 ng/mL              PCT 0.26 - 0.50 ng/mL               AND        <80% decrease in PCT             Encourage initiation of                                             antibiotics       Encourage continuation           of antibiotics    ----------------------------     -----------------------------        PCT >= 0.50 ng/mL                  PCT > 0.50 ng/mL               AND         increase in PCT                  Strongly encourage                                      initiation of antibiotics    Strongly encourage escalation           of antibiotics                                     -----------------------------                                           PCT <= 0.25 ng/mL                                                  OR                                        >  80% decrease in PCT                                      Discontinue / Do not initiate                                             antibiotics  Performed at [facility], [address] [place], [place] [address]   D-dimer, quantitative     Status: None   Collection Time: 11/05/23  8:24 AM  Result Value Ref Range   D-Dimer, Quant 0.48 0.00 - 0.50 ug/mL-FEU    Comment: (NOTE) At the manufacturer cut-off value of 0.5 g/mL FEU, this assay has a negative predictive value of 95-100%.This assay is intended for use in conjunction with a clinical pretest probability (PTP) assessment model to exclude pulmonary embolism (PE) and deep venous thrombosis (DVT) in outpatients suspected of PE or DVT. Results should be correlated with clinical presentation. Performed at [facility], [address] [place], [place] [address]   Blood gas, venous     Status: Abnormal   Collection Time: 11/05/23  8:24 AM  Result Value Ref Range   pH, Ven 7.39 7.25 - 7.43   pCO2, Ven 49 44 - 60 mmHg   pO2, Ven 35 32 - 45 mmHg   Bicarbonate 29.7 (H) 20.0 - 28.0 mmol/L   Acid-Base Excess 3.7 (H) 0.0 - 2.0 mmol/L   O2 Saturation 62.7 %   Patient temperature 37.0    Collection site VEIN     Comment: Performed at [facility], [address], [place], [place] [address]  Resp panel by RT-PCR (RSV, Flu A&B, Covid) Anterior Nasal Swab     Status: None   Collection Time: 11/05/23  8:24 AM   Specimen: Anterior Nasal Swab  Result Value Ref Range   SARS Coronavirus 2 by RT PCR NEGATIVE NEGATIVE    Comment: (NOTE) SARS-CoV-2 target nucleic acids are NOT DETECTED.  The SARS-CoV-2 RNA is generally detectable in upper respiratory specimens during the acute phase of infection. The lowest concentration of SARS-CoV-2 viral copies this assay can detect is 138 copies/mL. A negative result does not preclude SARS-Cov-2 infection and should not be  used as the sole basis for treatment or other patient management decisions. A negative result [place] occur with  improper specimen collection/handling, submission of specimen other than nasopharyngeal swab, presence of viral mutation(s) within the areas targeted by this assay, and inadequate number of viral copies(<138 copies/mL). A negative result must be combined with clinical observations, patient history, and epidemiological information. The expected result is Negative.  Fact Sheet for Patients:  BloggerCourse.com  Fact Sheet for Healthcare Providers:  SeriousBroker.it  This test is no t yet approved or cleared by the [place]  FDA and  has been authorized for detection and/or diagnosis of SARS-CoV-2 by FDA under an Emergency Use Authorization (EUA). This EUA will remain  in effect (meaning this test can be used) for the duration of the COVID-19 declaration under Section 564(b)(1) of the Act, 21 U.S.C.section 360bbb-3(b)(1), unless the authorization is terminated  or revoked sooner.       Influenza A by PCR NEGATIVE NEGATIVE   Influenza B by PCR NEGATIVE NEGATIVE    Comment: (NOTE) The Xpert Xpress SARS-CoV-2/FLU/RSV plus assay  is intended as an aid in the diagnosis of influenza from Nasopharyngeal swab specimens and should not be used as a sole basis for treatment. Nasal washings and aspirates are unacceptable for Xpert Xpress SARS-CoV-2/FLU/RSV testing.  Fact Sheet for Patients: BloggerCourse.com  Fact Sheet for Healthcare Providers: SeriousBroker.it  This test is not yet approved or cleared by the [place]  FDA and has been authorized for detection and/or diagnosis of SARS-CoV-2 by FDA under an Emergency Use Authorization (EUA). This EUA will remain in effect (meaning this test can be used) for the duration of the COVID-19 declaration under Section 564(b)(1) of the  Act, 21 U.S.C. section 360bbb-3(b)(1), unless the authorization is terminated or revoked.     Resp Syncytial Virus by PCR NEGATIVE NEGATIVE    Comment: (NOTE) Fact Sheet for Patients: BloggerCourse.com  Fact Sheet for Healthcare Providers: SeriousBroker.it  This test is not yet approved or cleared by the [place]  FDA and has been authorized for detection and/or diagnosis of SARS-CoV-2 by FDA under an Emergency Use Authorization (EUA). This EUA will remain in effect (meaning this test can be used) for the duration of the COVID-19 declaration under Section 564(b)(1) of the Act, 21 U.S.C. section 360bbb-3(b)(1), unless the authorization is terminated or revoked.  Performed at [facility], [address] [place], [place] [address]   Respiratory (~20 pathogens) panel by PCR     Status: None   Collection Time: 11/05/23 10:48 AM   Specimen: Nasopharyngeal Swab; Respiratory  Result Value Ref Range   Adenovirus NOT DETECTED NOT DETECTED   Coronavirus 229E NOT DETECTED NOT DETECTED    Comment: (NOTE) The Coronavirus on the Respiratory Panel, DOES NOT test for the novel  Coronavirus (2019 nCoV)    Coronavirus HKU1 NOT DETECTED NOT DETECTED   Coronavirus NL63 NOT DETECTED NOT DETECTED   Coronavirus OC43 NOT DETECTED NOT DETECTED   Metapneumovirus NOT DETECTED NOT DETECTED   Rhinovirus / Enterovirus NOT DETECTED NOT DETECTED   Influenza A NOT DETECTED NOT DETECTED   Influenza B NOT DETECTED NOT DETECTED   Parainfluenza Virus 1 NOT DETECTED NOT DETECTED   Parainfluenza Virus 2 NOT DETECTED NOT DETECTED   Parainfluenza Virus 3 NOT DETECTED NOT DETECTED   Parainfluenza Virus 4 NOT DETECTED NOT DETECTED   Respiratory Syncytial Virus NOT DETECTED NOT DETECTED   Bordetella pertussis NOT DETECTED NOT DETECTED   Bordetella Parapertussis NOT DETECTED NOT DETECTED   Chlamydophila pneumoniae NOT DETECTED NOT DETECTED    Mycoplasma pneumoniae NOT DETECTED NOT DETECTED    Comment: Performed at [facility], [address], [place], [place] [address]  Expectorated Sputum Assessment w Gram Stain, Rflx to Resp Cult     Status: None   Collection Time: 11/05/23 11:03 AM   Specimen: Expectorated Sputum  Result Value Ref Range   Specimen Description EXPECTORATED SPUTUM    Special Requests NONE    Sputum evaluation      THIS SPECIMEN IS ACCEPTABLE FOR SPUTUM CULTURE Performed at [facility], [address] [place], [place] [address]    Report Status 11/05/2023 FINAL   Culture, Respiratory w Gram Stain     Status: None (Preliminary result)   Collection Time: 11/05/23 11:03 AM  Result Value Ref Range   Specimen Description      EXPECTORATED SPUTUM Performed at [facility], [address] [place], [place] [address]    Special Requests      NONE Reflexed from [id number] Performed at [facility], [address] [place], [place] [address]    Gram Stain      RARE WBC SEEN MODERATE GRAM NEGATIVE RODS MODERATE GRAM POSITIVE COCCI Performed at [facility], [address] [place], [place] [address]    Culture PENDING    Report Status PENDING     Allergies as of 11/01/2023       Reactions   Codeine Hives, Itching, Rash   Other Itching, Other (See Comments)   States antibiotic in the past caused itching but can not remember name        Medication List        Accurate as of November 01, 2023 11:59 PM. If you have any questions, ask your nurse or doctor.          acetaminophen  325 MG tablet Commonly known as: TYLENOL  Take 2 tablets (650 mg total) by mouth every 6 (six) hours as needed for mild pain (pain score 1-3) or moderate pain (pain score 4-6).   albuterol  108 (90 Base) MCG/ACT inhaler Commonly known as: VENTOLIN  HFA INHALE 1-2 PUFFS EVERY 4-6 HOURS AS NEEDED FOR SHORTNESS OF BREATH   atorvastatin  40 MG tablet Commonly known as: LIPITOR ONCE A DAY  FOR CHOLESTEROL   baclofen  10 MG tablet Commonly known as: LIORESAL  TAKE 1 TABLET BY MOUTH TWICE DAILY AS NEEDED FOR BACK SPASM.   Breztri  Aerosphere 160-9-4.8 MCG/ACT Aero inhaler Generic drug: budesonide -glycopyrrolate -formoterol  Inhale 2 puffs into the lungs in the morning and at bedtime.   chlorhexidine  0.12 % solution Commonly known as: PERIDEX  USE AS DIRECTED 15 ML IN THE MOUTH OR THROAT TWICE A DAY AND SPIT OUT   chlorpheniramine-HYDROcodone  10-8 MG/5ML Commonly known as: TUSSIONEX Take 5 mLs by mouth every 12 (twelve) hours as needed for cough. Started by: ALAN CHRISTELLA ARRANT   Eliquis  5 MG Tabs tablet Generic drug: apixaban  TAKE 1 TABLET BY MOUTH TWICE A DAY   enalapril  2.5 MG tablet Commonly known as: VASOTEC  TAKE 1 TABLET BY MOUTH EVERY DAY   esomeprazole 40 MG capsule Commonly known as: NEXIUM TAKE 1 CAPSULE BY MOUTH EVERY DAY IN THE MORNING   furosemide  40 MG tablet Commonly known as: LASIX  Take 0.5 tablets (20 mg total) by mouth daily.   gabapentin  300 MG capsule Commonly known as: NEURONTIN  Take 300 mg by mouth daily.   Gemtesa  75 MG Tabs Generic drug: Vibegron  Take 1 tablet (75 mg total) by mouth daily.   ipratropium-albuterol  0.5-2.5 (3) MG/3ML Soln Commonly known as: DUONEB Take 3 mLs by nebulization every 6 (six) hours as needed.   metoprolol  succinate 25 MG 24 hr tablet Commonly known as: TOPROL -XL TAKE 1 TABLET (25 MG TOTAL) BY MOUTH DAILY.   montelukast  10 MG tablet Commonly known as: SINGULAIR  TAKE 1 TABLET BY MOUTH EVERY DAY   nystatin  powder Commonly known as: nystatin  Apply 1 Application topically 3 (three) times daily as needed. Irritation under breast and groin area   ofloxacin 0.3 % OTIC solution Commonly known as: FLOXIN 5 DROPS IN AFFECTED EAR ONCE DAILY FOR 5 DAYS. What changed: See the new instructions.   Taltz 80 MG/ML pen Generic drug: ixekizumab Inject 80 mg into the skin every 28 (twenty-eight) days.   Trelegy  Ellipta 100-62.5-25 MCG/ACT Aepb Generic drug: Fluticasone -Umeclidin-Vilant Inhale 1 puff into the lungs daily.   triamcinolone  cream 0.1 % Commonly known as: KENALOG  APPLY TO AFFECTED AREA TWICE A DAY   Zepbound  2.5 MG/0.5ML Pen Generic drug: tirzepatide  Inject 2.5 mg into the skin once a week.  Assessment & Plan Acute upper respiratory infection Sore throat Rapid strep and flu both negative today in office Sending Viral culture to lab for COVID/Flu/RSV PCR testing.  Will call with results.  Sending antibiotics and cough meds until we get these results.    Return as scheduled unless not improving.  Total time spent: 20 minutes  ALAN CHRISTELLA ARRANT, FNP  11/01/2023   This document [place] have been prepared by [facility] [employer] Voice Recognition software and as such Minium include unintentional dictation errors.

## 2023-11-03 ENCOUNTER — Ambulatory Visit: Admission: RE | Admit: 2023-11-03 | Source: Ambulatory Visit

## 2023-11-03 LAB — COVID-19, FLU A+B AND RSV
Influenza A, NAA: NOT DETECTED
Influenza B, NAA: NOT DETECTED
RSV, NAA: NOT DETECTED
SARS-CoV-2, NAA: NOT DETECTED

## 2023-11-05 ENCOUNTER — Inpatient Hospital Stay
Admission: EM | Admit: 2023-11-05 | Discharge: 2023-11-09 | DRG: 190 | Disposition: A | Discharge status: Home / Self Care | Attending: Internal Medicine | Admitting: Internal Medicine

## 2023-11-05 ENCOUNTER — Encounter: Payer: Self-pay | Admitting: Internal Medicine

## 2023-11-05 ENCOUNTER — Emergency Department

## 2023-11-05 ENCOUNTER — Other Ambulatory Visit: Payer: Self-pay

## 2023-11-05 DIAGNOSIS — Z1152 Encounter for screening for COVID-19: Secondary | ICD-10-CM | POA: Diagnosis not present

## 2023-11-05 DIAGNOSIS — Z79899 Other long term (current) drug therapy: Secondary | ICD-10-CM | POA: Diagnosis not present

## 2023-11-05 DIAGNOSIS — J9801 Acute bronchospasm: Principal | ICD-10-CM

## 2023-11-05 DIAGNOSIS — J45901 Unspecified asthma with (acute) exacerbation: Secondary | ICD-10-CM | POA: Diagnosis present

## 2023-11-05 DIAGNOSIS — I2510 Atherosclerotic heart disease of native coronary artery without angina pectoris: Secondary | ICD-10-CM | POA: Diagnosis present

## 2023-11-05 DIAGNOSIS — J449 Chronic obstructive pulmonary disease, unspecified: Secondary | ICD-10-CM | POA: Diagnosis present

## 2023-11-05 DIAGNOSIS — Z7951 Long term (current) use of inhaled steroids: Secondary | ICD-10-CM | POA: Diagnosis not present

## 2023-11-05 DIAGNOSIS — Z7901 Long term (current) use of anticoagulants: Secondary | ICD-10-CM

## 2023-11-05 DIAGNOSIS — R0602 Shortness of breath: Secondary | ICD-10-CM

## 2023-11-05 DIAGNOSIS — I252 Old myocardial infarction: Secondary | ICD-10-CM

## 2023-11-05 DIAGNOSIS — K219 Gastro-esophageal reflux disease without esophagitis: Secondary | ICD-10-CM | POA: Diagnosis present

## 2023-11-05 DIAGNOSIS — Z8249 Family history of ischemic heart disease and other diseases of the circulatory system: Secondary | ICD-10-CM

## 2023-11-05 DIAGNOSIS — I110 Hypertensive heart disease with heart failure: Secondary | ICD-10-CM | POA: Diagnosis present

## 2023-11-05 DIAGNOSIS — E785 Hyperlipidemia, unspecified: Secondary | ICD-10-CM | POA: Diagnosis present

## 2023-11-05 DIAGNOSIS — I5032 Chronic diastolic (congestive) heart failure: Secondary | ICD-10-CM | POA: Diagnosis present

## 2023-11-05 DIAGNOSIS — E039 Hypothyroidism, unspecified: Secondary | ICD-10-CM | POA: Diagnosis present

## 2023-11-05 DIAGNOSIS — J441 Chronic obstructive pulmonary disease with (acute) exacerbation: Principal | ICD-10-CM | POA: Diagnosis present

## 2023-11-05 DIAGNOSIS — M79604 Pain in right leg: Secondary | ICD-10-CM

## 2023-11-05 DIAGNOSIS — Z7985 Long-term (current) use of injectable non-insulin antidiabetic drugs: Secondary | ICD-10-CM

## 2023-11-05 DIAGNOSIS — J9601 Acute respiratory failure with hypoxia: Secondary | ICD-10-CM | POA: Diagnosis present

## 2023-11-05 DIAGNOSIS — Z885 Allergy status to narcotic agent status: Secondary | ICD-10-CM | POA: Diagnosis not present

## 2023-11-05 DIAGNOSIS — G4733 Obstructive sleep apnea (adult) (pediatric): Secondary | ICD-10-CM | POA: Diagnosis present

## 2023-11-05 DIAGNOSIS — Z881 Allergy status to other antibiotic agents status: Secondary | ICD-10-CM | POA: Diagnosis not present

## 2023-11-05 DIAGNOSIS — Z87891 Personal history of nicotine dependence: Secondary | ICD-10-CM

## 2023-11-05 DIAGNOSIS — Z791 Long term (current) use of non-steroidal anti-inflammatories (NSAID): Secondary | ICD-10-CM

## 2023-11-05 DIAGNOSIS — Z79620 Long term (current) use of immunosuppressive biologic: Secondary | ICD-10-CM

## 2023-11-05 DIAGNOSIS — I4891 Unspecified atrial fibrillation: Secondary | ICD-10-CM | POA: Diagnosis present

## 2023-11-05 DIAGNOSIS — Z6841 Body Mass Index (BMI) 40.0 and over, adult: Secondary | ICD-10-CM | POA: Diagnosis not present

## 2023-11-05 DIAGNOSIS — L409 Psoriasis, unspecified: Secondary | ICD-10-CM | POA: Diagnosis present

## 2023-11-05 DIAGNOSIS — Z803 Family history of malignant neoplasm of breast: Secondary | ICD-10-CM

## 2023-11-05 LAB — BASIC METABOLIC PANEL WITH GFR
Anion gap: 10 (ref 5–15)
BUN: 10 mg/dL (ref 8–23)
CO2: 25 mmol/L (ref 22–32)
Calcium: 8.7 mg/dL — ABNORMAL LOW (ref 8.9–10.3)
Chloride: 101 mmol/L (ref 98–111)
Creatinine, Ser: 0.96 mg/dL (ref 0.44–1.00)
GFR, Estimated: >60 mL/min (ref >60)
Glucose, Bld: 133 mg/dL — ABNORMAL HIGH (ref 70–99)
Potassium: 3.8 mmol/L (ref 3.5–5.1)
Sodium: 136 mmol/L (ref 135–145)

## 2023-11-05 LAB — RESPIRATORY PANEL BY PCR

## 2023-11-05 LAB — BLOOD GAS, VENOUS
Acid-Base Excess: 3.7 mmol/L — ABNORMAL HIGH (ref 0.0–2.0)
Bicarbonate: 29.7 mmol/L — ABNORMAL HIGH (ref 20.0–28.0)
O2 Saturation: 62.7 %
Patient temperature: 37
pCO2, Ven: 49 mmHg (ref 44–60)
pH, Ven: 7.39 (ref 7.25–7.43)
pO2, Ven: 35 mmHg (ref 32–45)

## 2023-11-05 LAB — RESP PANEL BY RT-PCR (RSV, FLU A&B, COVID)  RVPGX2
Influenza A by PCR: NEGATIVE
Influenza B by PCR: NEGATIVE
Resp Syncytial Virus by PCR: NEGATIVE
SARS Coronavirus 2 by RT PCR: NEGATIVE

## 2023-11-05 LAB — CBC
HCT: 43.3 % (ref 36.0–46.0)
Hemoglobin: 14 g/dL (ref 12.0–15.0)
MCH: 27.6 pg (ref 26.0–34.0)
MCHC: 32.3 g/dL (ref 30.0–36.0)
MCV: 85.4 fL (ref 80.0–100.0)
Platelets: 312 K/uL (ref 150–400)
RBC: 5.07 MIL/uL (ref 3.87–5.11)
RDW: 13.3 % (ref 11.5–15.5)
WBC: 8.2 K/uL (ref 4.0–10.5)
nRBC: 0 % (ref 0.0–0.2)

## 2023-11-05 LAB — BRAIN NATRIURETIC PEPTIDE: B Natriuretic Peptide: 241.8 pg/mL — ABNORMAL HIGH (ref 0.0–100.0)

## 2023-11-05 LAB — MAGNESIUM: Magnesium: 1.9 mg/dL (ref 1.7–2.4)

## 2023-11-05 LAB — PROCALCITONIN: Procalcitonin: <0.1 ng/mL

## 2023-11-05 LAB — EXPECTORATED SPUTUM ASSESSMENT W GRAM STAIN, RFLX TO RESP C

## 2023-11-05 LAB — LIPASE, BLOOD: Lipase: 23 U/L (ref 11–51)

## 2023-11-05 LAB — D-DIMER, QUANTITATIVE: D-Dimer, Quant: 0.48 ug{FEU}/mL (ref 0.00–0.50)

## 2023-11-05 MED ORDER — MIRABEGRON ER 25 MG PO TB24
25.0000 mg | ORAL_TABLET | Freq: Every day | ORAL | Status: DC
  Administered 2023-11-05 – 2023-11-09 (×5): 25 mg via ORAL
  Filled 2023-11-05 (×5): qty 1

## 2023-11-05 MED ORDER — GABAPENTIN 300 MG PO CAPS
300.0000 mg | ORAL_CAPSULE | Freq: Every day | ORAL | Status: DC
Start: 2023-11-05 — End: 2023-11-06
  Administered 2023-11-05 – 2023-11-06 (×2): 300 mg via ORAL
  Filled 2023-11-05 (×2): qty 1

## 2023-11-05 MED ORDER — METHYLPREDNISOLONE SODIUM SUCC 125 MG IJ SOLR
125.0000 mg | Freq: Once | INTRAMUSCULAR | Status: AC
  Administered 2023-11-05: 125 mg via INTRAVENOUS
  Filled 2023-11-05: qty 2

## 2023-11-05 MED ORDER — BENZONATATE 100 MG PO CAPS
200.0000 mg | ORAL_CAPSULE | Freq: Three times a day (TID) | ORAL | Status: DC | PRN
  Administered 2023-11-05 (×2): 200 mg via ORAL
  Filled 2023-11-05 (×2): qty 2

## 2023-11-05 MED ORDER — ALBUTEROL SULFATE (2.5 MG/3ML) 0.083% IN NEBU
2.5000 mg | INHALATION_SOLUTION | RESPIRATORY_TRACT | Status: DC | PRN
  Administered 2023-11-07: 2.5 mg via RESPIRATORY_TRACT
  Filled 2023-11-05: qty 3

## 2023-11-05 MED ORDER — METHYLPREDNISOLONE SODIUM SUCC 40 MG IJ SOLR
40.0000 mg | Freq: Two times a day (BID) | INTRAMUSCULAR | Status: AC
  Administered 2023-11-05 – 2023-11-06 (×2): 40 mg via INTRAVENOUS
  Filled 2023-11-05 (×2): qty 1

## 2023-11-05 MED ORDER — IPRATROPIUM-ALBUTEROL 0.5-2.5 (3) MG/3ML IN SOLN
3.0000 mL | Freq: Once | RESPIRATORY_TRACT | Status: AC
  Administered 2023-11-05: 3 mL via RESPIRATORY_TRACT
  Filled 2023-11-05: qty 3

## 2023-11-05 MED ORDER — AZITHROMYCIN 500 MG PO TABS
500.0000 mg | ORAL_TABLET | Freq: Every day | ORAL | Status: AC
  Administered 2023-11-06 – 2023-11-07 (×2): 500 mg via ORAL
  Filled 2023-11-05 (×2): qty 1

## 2023-11-05 MED ORDER — GUAIFENESIN ER 600 MG PO TB12
1200.0000 mg | ORAL_TABLET | Freq: Two times a day (BID) | ORAL | Status: DC
  Administered 2023-11-05 – 2023-11-09 (×9): 1200 mg via ORAL
  Filled 2023-11-05 (×9): qty 2

## 2023-11-05 MED ORDER — APIXABAN 5 MG PO TABS
5.0000 mg | ORAL_TABLET | Freq: Two times a day (BID) | ORAL | Status: DC
  Administered 2023-11-05 – 2023-11-09 (×8): 5 mg via ORAL
  Filled 2023-11-05 (×8): qty 1

## 2023-11-05 MED ORDER — METOPROLOL SUCCINATE ER 25 MG PO TB24: 25.0000 mg | ORAL_TABLET | Freq: Every day | ORAL | Status: DC

## 2023-11-05 MED ORDER — PREDNISONE 20 MG PO TABS: 40.0000 mg | ORAL_TABLET | Freq: Every day | ORAL | Status: DC

## 2023-11-05 MED ORDER — IPRATROPIUM-ALBUTEROL 0.5-2.5 (3) MG/3ML IN SOLN
3.0000 mL | RESPIRATORY_TRACT | Status: DC
  Administered 2023-11-05 (×3): 3 mL via RESPIRATORY_TRACT
  Filled 2023-11-05 (×3): qty 3

## 2023-11-05 MED ORDER — FUROSEMIDE 20 MG PO TABS
20.0000 mg | ORAL_TABLET | Freq: Every day | ORAL | Status: DC
  Administered 2023-11-05 – 2023-11-09 (×5): 20 mg via ORAL
  Filled 2023-11-05 (×5): qty 1

## 2023-11-05 MED ORDER — BUDESON-GLYCOPYRROL-FORMOTEROL 160-9-4.8 MCG/ACT IN AERO
2.0000 | INHALATION_SPRAY | Freq: Two times a day (BID) | RESPIRATORY_TRACT | Status: DC
  Administered 2023-11-05 – 2023-11-09 (×8): 2 via RESPIRATORY_TRACT
  Filled 2023-11-05 (×2): qty 5.9

## 2023-11-05 MED ORDER — ONDANSETRON HCL 4 MG/2ML IJ SOLN: 4.0000 mg | Freq: Four times a day (QID) | INTRAMUSCULAR | Status: DC | PRN

## 2023-11-05 MED ORDER — ONDANSETRON HCL 4 MG PO TABS: 4.0000 mg | ORAL_TABLET | Freq: Four times a day (QID) | ORAL | Status: DC | PRN

## 2023-11-05 MED ORDER — FLUTICASONE PROPIONATE 50 MCG/ACT NA SUSP
1.0000 | Freq: Every day | NASAL | Status: DC
  Administered 2023-11-06 – 2023-11-09 (×4): 1 via NASAL
  Filled 2023-11-05: qty 16

## 2023-11-05 MED ORDER — SODIUM CHLORIDE 0.9 % IV SOLN
500.0000 mg | INTRAVENOUS | Status: AC
  Administered 2023-11-05: 500 mg via INTRAVENOUS
  Filled 2023-11-05: qty 5

## 2023-11-05 MED ORDER — ACETAMINOPHEN 325 MG PO TABS
650.0000 mg | ORAL_TABLET | Freq: Four times a day (QID) | ORAL | Status: DC | PRN
  Administered 2023-11-05: 650 mg via ORAL
  Filled 2023-11-05: qty 2

## 2023-11-05 MED ORDER — KETOROLAC TROMETHAMINE 15 MG/ML IJ SOLN
15.0000 mg | Freq: Once | INTRAMUSCULAR | Status: AC
  Administered 2023-11-05: 15 mg via INTRAVENOUS
  Filled 2023-11-05: qty 1

## 2023-11-05 NOTE — Progress Notes (Signed)
 RX messages on EMAR from [place], [facility] stated medications verified at 1726. Medications are still showing as unverified on nursing side. Messaged Eryn to see if this could be corrected

## 2023-11-05 NOTE — Progress Notes (Signed)
 Neurontin , Eliquis , Toprol - XL, and Myrbetriq  still have not been verified by pharmacy. Multiple messages sent, messaged pharmacy again about these medications. Ordered at 1045 this am. MD has been made aware medications can not be given until verified by pharmacy.

## 2023-11-05 NOTE — Plan of Care (Signed)

## 2023-11-05 NOTE — Progress Notes (Signed)
 Toprol -XL was discontinued by pharmacy before verification and administration. MD messaged to notify that medication was discontinued and patient bp is 158/104 and no blood pressure medication ordered.

## 2023-11-05 NOTE — ED Notes (Signed)
 Sputum sample cup provided.

## 2023-11-05 NOTE — Progress Notes (Signed)
 Still awaiting pharmacy to verify the medications. Called twice.

## 2023-11-05 NOTE — ED Notes (Signed)
 Pt ambulating in room while monitoring O2 saturation. O2 dropped to 87%. Pt became short of breath during this time. MD notified.

## 2023-11-05 NOTE — Progress Notes (Signed)
 Patient placed on auto cpap with nasal mask with 2L of oxygen.  Patient tolerating well at this time.

## 2023-11-05 NOTE — H&P (Addendum)
 History and Physical    Patricia Walker DOB: 1945-04-13 DOA: 11/05/2023  PCP: [name], FNP (Confirm with patient/family/NH records and if not entered, this has to be entered at [facility] point of entry) Patient coming from: Home  I have personally briefly reviewed patient's old medical records in [facility]  Chief Complaint: Cough, wheezing, shortness of breath  HPI: Patricia Walker is a 78 y.o. female with medical history significant of asthma/COPD Gold stage II, CAD, PAF on Eliquis , HTN, HLD, hypothyroidism, OSA on CPAP at bedtime, morbid obesity, presented with worsening of cough wheezing shortness of breath.  Symptoms started 5 days ago, patient started develop runny nose, sore throat then followed by new onset of productive cough with clear phlegm, wheezing and shortness of breath and gradually getting worse.  She has been using around-the-clock albuterol  with little help for last 2 days and decided come to the hospital.  Denies any chest pain, no fever or chills.  ED Course: Afebrile, no tachycardia no hypotension O2 saturation 97% on room air however O2 saturation dropped to 86% on room air when ambulating for short distance.  Chest x-ray showed no acute infiltrates, blood work showed hemoglobin 14 WBC 8.2 BUN 10 creatinine 0.9.  Patient was given IV Solu-Medrol  and breathing treatment but still remain symptomatic.  Review of Systems: As per HPI otherwise 14 point review of systems negative.    Past Medical History:  Diagnosis Date   Anxiety    Arthritis    Asthma    COPD exacerbation (HCC) 10/08/2020   Coronary artery disease    mild, nonobstructive   Depression    Dyspnea    on exertion   Dysrhythmia    Atrial Fibrillation   GERD (gastroesophageal reflux disease)    Heart murmur    Hyperlipidemia    Hypertension    Hypothyroidism    MI, old 2016   nonobstructive CAD by Cath   Morbid obesity (HCC)    Persistent atrial fibrillation (HCC)     Pneumonia 06/2018   and RSV   Psoriasis    Sleep apnea    compliant with CPAP   Thyroid  disease     Past Surgical History:  Procedure Laterality Date   ARTERY BIOPSY Right 07/21/2017   Procedure: BIOPSY TEMPORAL ARTERY;  Surgeon: Jama Cordella MATSU, MD;  Location: [facility] OR[facility];  Service: Vascular;  Laterality: Right;   CARDIAC CATHETERIZATION     CARDIOVERSION Right 09/01/2016   Procedure: Cardioversion;  Surgeon: Fernand Denyse LABOR, MD;  Location: [facility];  Service: Cardiovascular;  Laterality: Right;   CARDIOVERSION N/A 09/09/2016   Procedure: Cardioversion;  Surgeon: Fernand Denyse LABOR, MD;  Location: [facility];  Service: Cardiovascular;  Laterality: N/A;   CATARACT EXTRACTION W/PHACO Right 03/26/2019   Procedure: CATARACT EXTRACTION PHACO AND INTRAOCULAR LENS PLACEMENT (IOC) RIGHT 6.45, 00:39.9;  Surgeon: Jaye Fallow, MD;  Location: [facility];  Service: Ophthalmology;  Laterality: Right;   CATARACT EXTRACTION W/PHACO Left 04/16/2019   Procedure: CATARACT EXTRACTION PHACO AND INTRAOCULAR LENS PLACEMENT (IOC) LEFT;   3.14, 00:24.9;  Surgeon: Jaye Fallow, MD;  Location: [facility];  Service: Ophthalmology;  Laterality: Left;  sleep apnea-CPAP   COLONOSCOPY WITH PROPOFOL  N/A 08/28/2019   Procedure: COLONOSCOPY WITH PROPOFOL ;  Surgeon: Toledo, Ladell POUR, MD;  Location: [facility];  Service: Gastroenterology;  Laterality: N/A;   ELECTROPHYSIOLOGIC STUDY N/A 02/01/2016   Procedure: CARDIOVERSION;  Surgeon: Denyse LABOR Fernand, MD;  Location: [facility];  Service: Cardiovascular;  Laterality: N/A;   ESOPHAGEAL DILATION     ESOPHAGOGASTRODUODENOSCOPY (EGD) WITH PROPOFOL  N/A 02/06/2017   Procedure: ESOPHAGOGASTRODUODENOSCOPY (EGD) WITH PROPOFOL ;  Surgeon: Therisa Bi, MD;  Location: [facility];  Service: Gastroenterology;  Laterality: N/A;   ESOPHAGOGASTRODUODENOSCOPY (EGD) WITH PROPOFOL  N/A 08/28/2019   Procedure: ESOPHAGOGASTRODUODENOSCOPY (EGD) WITH PROPOFOL ;  Surgeon: Toledo,  Ladell POUR, MD;  Location: [facility];  Service: Gastroenterology;  Laterality: N/A;   EUS N/A 02/16/2017   Procedure: FULL UPPER ENDOSCOPIC ULTRASOUND (EUS) RADIAL;  Surgeon: Elta Fonda SQUIBB, MD;  Location: [facility];  Service: Gastroenterology;  Laterality: N/A;   LEFT HEART CATH AND CORONARY ANGIOGRAPHY N/A 09/08/2016   Procedure: Left Heart Cath and Coronary Angiography;  Surgeon: Fernand Denyse LABOR, MD;  Location: [facility] IN[facility];  Service: Cardiovascular;  Laterality: N/A;   US  ECHOCARDIOGRAPHY       reports that she quit smoking about 10 years ago. Her smoking use included cigarettes. She has never used smokeless tobacco. She reports that she does not drink alcohol and does not use drugs.  Allergies  Allergen Reactions   Codeine Hives, Itching and Rash   Other Itching and Other (See Comments)    States antibiotic in the past caused itching but can not remember name    Family History  Problem Relation Age of Onset   Breast cancer Paternal Aunt    Other Father    Heart attack Father      Prior to Admission medications   Medication Sig Start Date End Date Taking? Authorizing Provider  acetaminophen  (TYLENOL ) 325 MG tablet Take 2 tablets (650 mg total) by mouth every 6 (six) hours as needed for mild pain (pain score 1-3) or moderate pain (pain score 4-6). 08/07/23   Jens Durand, MD  albuterol  (VENTOLIN  HFA) 108 (90 Base) MCG/ACT inhaler INHALE 1-2 PUFFS EVERY 4-6 HOURS AS NEEDED FOR SHORTNESS OF BREATH 10/02/23   [name], FNP  atorvastatin  (LIPITOR) 40 MG tablet ONCE A DAY FOR CHOLESTEROL Patient not taking: Reported on 11/01/2023 11/23/22   [name], FNP  baclofen  (LIORESAL ) 10 MG tablet TAKE 1 TABLET BY MOUTH TWICE DAILY AS NEEDED FOR BACK SPASM. Patient not taking: Reported on 11/01/2023 11/24/22   Fernand Fredy RAMAN, MD  budeson-glycopyrrolate -formoterol  (BREZTRI  AEROSPHERE) 160-9-4.8 MCG/ACT AERO inhaler Inhale 2 puffs into the lungs in the morning and at  bedtime. 08/17/23   [name], FNP  chlorhexidine  (PERIDEX ) 0.12 % solution USE AS DIRECTED 15 ML IN THE MOUTH OR THROAT TWICE A DAY AND SPIT OUT Patient not taking: Reported on 11/01/2023 08/03/23   [name], FNP  chlorpheniramine-HYDROcodone  (TUSSIONEX) 10-8 MG/5ML Take 5 mLs by mouth every 12 (twelve) hours as needed for cough. 11/01/23   [name], FNP  ELIQUIS  5 MG TABS tablet TAKE 1 TABLET BY MOUTH TWICE A DAY 08/03/23   [name], FNP  enalapril  (VASOTEC ) 2.5 MG tablet TAKE 1 TABLET BY MOUTH EVERY DAY Patient not taking: Reported on 11/01/2023 08/03/23   [name], FNP  esomeprazole (NEXIUM) 40 MG capsule TAKE 1 CAPSULE BY MOUTH EVERY DAY IN THE MORNING Patient not taking: Reported on 11/01/2023 11/23/22   [name], FNP  furosemide  (LASIX ) 40 MG tablet Take 0.5 tablets (20 mg total) by mouth daily. 08/08/23   Jens Durand, MD  gabapentin  (NEURONTIN ) 300 MG capsule Take 300 mg by mouth daily.    [provider]  ipratropium-albuterol  (DUONEB) 0.5-2.5 (3) MG/3ML SOLN Take 3 mLs by nebulization every  6 (six) hours as needed. 08/14/23   [name], FNP  metoprolol  succinate (TOPROL -XL) 25 MG 24 hr tablet TAKE 1 TABLET (25 MG TOTAL) BY MOUTH DAILY. 04/27/23   [name], FNP  montelukast  (SINGULAIR ) 10 MG tablet TAKE 1 TABLET BY MOUTH EVERY DAY Patient not taking: Reported on 11/01/2023 11/23/22   [name], FNP  nystatin  powder Apply 1 Application topically 3 (three) times daily as needed. Irritation under breast and groin area 07/12/23   [name], FNP  ofloxacin (FLOXIN) 0.3 % OTIC solution 5 DROPS IN AFFECTED EAR ONCE DAILY FOR 5 DAYS. Patient taking differently: as needed. 08/08/23   [name], FNP  TALTZ 80 MG/ML SOAJ Inject 80 mg into the skin every 28 (twenty-eight) days.    [provider]  tirzepatide  (ZEPBOUND ) 2.5 MG/0.5ML Pen Inject 2.5 mg into the skin once a week. 10/11/23   [name],  FNP  TRELEGY ELLIPTA 100-62.5-25 MCG/ACT AEPB Inhale 1 puff into the lungs daily.    [provider]  triamcinolone  cream (KENALOG ) 0.1 % APPLY TO AFFECTED AREA TWICE A DAY 08/03/23   [name], FNP  Vibegron  (GEMTESA ) 75 MG TABS Take 1 tablet (75 mg total) by mouth daily. 10/11/23   [name], FNP    Physical Exam: Vitals:   11/05/23 0840 11/05/23 0950 11/05/23 1001 11/05/23 1004  BP:    117/63  Pulse: 64 72 74 75  Resp: 17  (!) 21 (!) 21  Temp:      TempSrc:      SpO2: 100% 100% 99% 100%  Weight:      Height:        Constitutional: NAD, calm, comfortable Vitals:   11/05/23 0840 11/05/23 0950 11/05/23 1001 11/05/23 1004  BP:    117/63  Pulse: 64 72 74 75  Resp: 17  (!) 21 (!) 21  Temp:      TempSrc:      SpO2: 100% 100% 99% 100%  Weight:      Height:       Eyes: PERRL, lids and conjunctivae normal ENMT: Mucous membranes are moist. Posterior pharynx clear of any exudate or lesions.Normal dentition.  Neck: normal, supple, no masses, no thyromegaly Respiratory: clear to auscultation bilaterally, diffused wheezing and scattered crackles bilaterally, increasing respiratory effort. No accessory muscle use.  Cardiovascular: Irregular heart rate, no murmurs / rubs / gallops. No extremity edema. 2+ pedal pulses. No carotid bruits.  Abdomen: no tenderness, no masses palpated. No hepatosplenomegaly. Bowel sounds positive.  Musculoskeletal: no clubbing / cyanosis. No joint deformity upper and lower extremities. Good ROM, no contractures. Normal muscle tone.  Skin: no rashes, lesions, ulcers. No induration Neurologic: CN 2-12 grossly intact. Sensation intact, DTR normal. Strength 5/5 in all 4.  Psychiatric: Normal judgment and insight. Alert and oriented x 3. Normal mood.     Labs on Admission: I have personally reviewed following labs and imaging studies  CBC: Recent Labs  Lab 11/05/23 0749  WBC 8.2  HGB 14.0  HCT 43.3  MCV 85.4  PLT 312   Basic  Metabolic Panel: Recent Labs  Lab 11/05/23 0749 11/05/23 0824  NA 136  --   K 3.8  --   CL 101  --   CO2 25  --   GLUCOSE 133*  --   BUN 10  --   CREATININE 0.96  --   CALCIUM  8.7*  --   MG  --  1.9  GFR: Estimated Creatinine Clearance: 67.6 mL/min (by C-G formula based on SCr of 0.96 mg/dL). Liver Function Tests: No results for input(s): AST, ALT, ALKPHOS, BILITOT, PROT, ALBUMIN in the last 168 hours. Recent Labs  Lab 11/05/23 0749  LIPASE 23   No results for input(s): AMMONIA in the last 168 hours. Coagulation Profile: No results for input(s): INR, PROTIME in the last 168 hours. Cardiac Enzymes: No results for input(s): CKTOTAL, CKMB, CKMBINDEX, TROPONINI in the last 168 hours. BNP (last 3 results) No results for input(s): PROBNP in the last 8760 hours. HbA1C: No results for input(s): HGBA1C in the last 72 hours. CBG: No results for input(s): GLUCAP in the last 168 hours. Lipid Profile: No results for input(s): CHOL, HDL, LDLCALC, TRIG, CHOLHDL, LDLDIRECT in the last 72 hours. Thyroid  Function Tests: No results for input(s): TSH, T4TOTAL, FREET4, T3FREE, THYROIDAB in the last 72 hours. Anemia Panel: No results for input(s): VITAMINB12, FOLATE, FERRITIN, TIBC, IRON, RETICCTPCT in the last 72 hours. Urine analysis:    Component Value Date/Time   COLORURINE YELLOW (A) 10/08/2020 1745   APPEARANCEUR HAZY (A) 10/08/2020 1745   APPEARANCEUR Clear 07/14/2012 0822   LABSPEC 1.025 10/08/2020 1745   LABSPEC 1.021 07/14/2012 0822   PHURINE 5.0 10/08/2020 1745   GLUCOSEU NEGATIVE 10/08/2020 1745   GLUCOSEU Negative 07/14/2012 0822   HGBUR NEGATIVE 10/08/2020 1745   BILIRUBINUR Negative 01/31/2023 1339   BILIRUBINUR Negative 07/14/2012 0822   KETONESUR NEGATIVE 10/08/2020 1745   PROTEINUR Negative 01/31/2023 1339   PROTEINUR NEGATIVE 10/08/2020 1745   UROBILINOGEN 0.2 01/31/2023 1339   NITRITE Negative  01/31/2023 1339   NITRITE NEGATIVE 10/08/2020 1745   LEUKOCYTESUR Negative 01/31/2023 1339   LEUKOCYTESUR NEGATIVE 10/08/2020 1745   LEUKOCYTESUR Trace 07/14/2012 0822    Radiological Exams on Admission: US  Venous Img Lower Bilateral Result Date: 11/05/2023 CLINICAL DATA:  Right lower extremity pain. EXAM: Bilateral LOWER EXTREMITY VENOUS DOPPLER ULTRASOUND TECHNIQUE: Gray-scale sonography with compression, as well as color and duplex ultrasound, were performed to evaluate the deep venous system(s) from the level of the common femoral vein through the popliteal and proximal calf veins. COMPARISON:  Ultrasound dated 09/08/2020. FINDINGS: VENOUS Normal compressibility of the common femoral, superficial femoral, and popliteal veins, as well as the visualized calf veins. Visualized portions of profunda femoral vein and great saphenous vein unremarkable. No filling defects to suggest DVT on grayscale or color Doppler imaging. Doppler waveforms show normal direction of venous flow, normal respiratory plasticity and response to augmentation. OTHER None. Limitations: none IMPRESSION: Negative. Electronically Signed   By: Vanetta Chou M.D.   On: 11/05/2023 09:54   DG Chest 2 View Result Date: 11/05/2023 CLINICAL DATA:  Shortness of breath. EXAM: CHEST - 2 VIEW COMPARISON:  08/05/2023 FINDINGS: The cardio pericardial silhouette is enlarged. The lungs are clear without focal pneumonia, edema, pneumothorax or pleural effusion. No acute bony abnormality. Telemetry leads overlie the chest. IMPRESSION: Enlargement of the cardiopericardial silhouette without acute cardiopulmonary findings. Electronically Signed   By: Camellia Candle M.D.   On: 11/05/2023 09:23    EKG: Independently reviewed.  A-fib, no acute ST changes  Assessment/Plan Principal Problem:   COPD (chronic obstructive pulmonary disease) (HCC) Active Problems:   COPD exacerbation (HCC)  (please populate well all problems here in Problem List.  (For example, if patient is on BP meds at home and you resume or decide to hold them, it is a problem that needs to be her. Same for CAD, COPD, HLD and so on)  Acute hypoxic respiratory failure Acute COPD exacerbation - Increasing secretion and productive cough, Short course of azithromycin  - Solu-Medrol  bridging for p.o. prednisone  - Continue ICS and LABA - DuoNebs and as needed albuterol  - Incentive spirometry and flutter valve - Check respiratory viral panel  PAF - Rate controlled A-fib - Continue metoprolol , continue Eliquis   HTN Chronic HFpEF - Euvolemic - Continue p.o. Lasix , enalapril , metoprolol   Morbid obesity - BMI= 51 - Outpatient GLP-1 evaluation  Total time spent on patient care 55 minutes  DVT prophylaxis: Eliquis  Code Status: Full code Family Communication: None at bedside Disposition Plan: Expect less than 2 midnight hospital stay Consults called: None Admission status: Telemetry observation   Cort ONEIDA Mana MD [facility] Pager [phone]  11/05/2023, 10:37 AM

## 2023-11-05 NOTE — ED Triage Notes (Signed)
 Pt to ED for c/o productive cough and shortness of breath that started a week ago. Pt states she saw MD on Wednesday and was given cough syrup, but it has gotten worse.

## 2023-11-05 NOTE — ED Provider Notes (Signed)
 [facility] Provider Note    Event Date/Time   First MD Initiated Contact with Patient 11/05/23 [phone]     (approximate)   History   Shortness of Breath   HPI  Patricia Walker is a 78 y.o. female  afib (on eliquis ), CAD, COPD, CHF, HTN, GERD, OSA on CPAP who presents with 5 days of progressively worsening shortness of breath, cough and sore throat.  Symptoms initiated with a sore throat.  She developed a nonproductive cough several hours later.  Denies any fevers or chills or chest pain.  She does have epigastric abdominal pain which initiated after a large coughing fit.  She last gave herself a nebulizing treatment yesterday evening.  She states that she has used her CPAP machine but has missed several doses of Eliquis .  She has right lower extremity pain at baseline however it is slightly worse today.  Denies any headache hearing or vision changes, changes in urinary or bowel habits, SI HI or AVHS      Physical Exam   Triage Vital Signs: ED Triage Vitals  Encounter Vitals Group     BP 11/05/23 0742 (!) 141/90     Girls Systolic BP Percentile --      Girls Diastolic BP Percentile --      Boys Systolic BP Percentile --      Boys Diastolic BP Percentile --      Pulse Rate 11/05/23 0742 76     Resp 11/05/23 0742 20     Temp 11/05/23 0742 98.3 F (36.8 C)     Temp Source 11/05/23 0742 Oral     SpO2 11/05/23 0742 97 %     Weight 11/05/23 0745 300 lb (136.1 kg)     Height 11/05/23 0745 5' 4 (1.626 m)     Head Circumference --      Peak Flow --      Pain Score 11/05/23 0743 5     Pain Loc --      Pain Education --      Exclude from Growth Chart --     Most recent vital signs: Vitals:   11/05/23 1001 11/05/23 1004  BP:  117/63  Pulse: 74 75  Resp: (!) 21 (!) 21  Temp:    SpO2: 99% 100%    Nursing Triage Note reviewed. Vital signs reviewed and patients oxygen saturation is normoxic  General: Patient is well nourished, well developed, awake and  alert, appears uncomfortable, Head: Normocephalic and atraumatic Eyes: Normal inspection, extraocular muscles intact, no conjunctival pallor Ear, nose, throat: Normal external exam No trismus, no intraoral exudates Neck: Normal range of motion Respiratory: Patient is in mild respiratory distress, lungs with wheezes throughout and mild rhonchi Cardiovascular: Patient is not tachycardic, irregular irregular GI: Abd SNT with no guarding or rebound  Back: Normal inspection of the back with good strength and range of motion throughout all ext Extremities: pulses intact with good cap refills, patient has bilateral 1+ lower extremity edema and very tender to palpation in the right calf Neuro: The patient is alert and oriented to person, place, and time, appropriately conversive, with 5/5 bilat UE/LE strength, no gross motor or sensory defects noted. Coordination appears to be adequate. Skin: Warm, dry, and intact Psych: normal mood and affect, no SI or HI  ED Results / Procedures / Treatments   Labs (all labs ordered are listed, but only abnormal results are displayed) Labs Reviewed  BASIC METABOLIC PANEL WITH GFR - Abnormal;  Notable for the following components:      Result Value   Glucose, Bld 133 (*)    Calcium  8.7 (*)    All other components within normal limits  BRAIN NATRIURETIC PEPTIDE - Abnormal; Notable for the following components:   B Natriuretic Peptide 241.8 (*)    All other components within normal limits  BLOOD GAS, VENOUS - Abnormal; Notable for the following components:   Bicarbonate 29.7 (*)    Acid-Base Excess 3.7 (*)    All other components within normal limits  RESP PANEL BY RT-PCR (RSV, FLU A&B, COVID)  [id number]  CBC  MAGNESIUM   D-DIMER, QUANTITATIVE  LIPASE, BLOOD  PROCALCITONIN     EKG EKG and rhythm strip are interpreted by myself:   EKG: afib at heart rate of 70, normal QRS duration, QTc 473, nonspecific ST segments and T waves no ectopy EKG not consistent  with Acute STEMI Rhythm strip: afib in lead II   RADIOLOGY XR chest: No acute abnormality on my independent review interpretation and radiologist agrees US  BLE: No DVT bilaterally   PROCEDURES:  Critical Care performed: No  Procedures   MEDICATIONS ORDERED IN ED: Medications  ipratropium-albuterol  (DUONEB) 0.5-2.5 (3) MG/3ML nebulizer solution 3 mL (3 mLs Nebulization Given 11/05/23 9177)  methylPREDNISolone  sodium succinate (SOLU-MEDROL ) 125 mg/2 mL injection 125 mg (125 mg Intravenous Given 11/05/23 0814)  ipratropium-albuterol  (DUONEB) 0.5-2.5 (3) MG/3ML nebulizer solution 3 mL (3 mLs Nebulization Given 11/05/23 0933)  ketorolac  (TORADOL ) 15 MG/ML injection 15 mg (15 mg Intravenous Given 11/05/23 0933)     IMPRESSION / MDM / ASSESSMENT AND PLAN / ED COURSE                                Differential diagnosis includes, but is not limited to, COPD exacerbation, URI, pneumonia, CHF, PE, hypercarbia, DVT  8:34 AM on 11/05/2023 ED course: Patient presents satting well on room air however significant bronchospasm heard in lungs bilaterally and appears uncomfortable.  I have ordered her a DuoNeb and 125 mg of Solu-Medrol .  She has missed several doses of her Eliquis  and she is on A-fib, will evaluate for VTE with Doppler ultrasounds and a D-dimer.  Chest x-ray is pending.  Disposition pending reassessment and diagnostics   Clinical Course as of 11/05/23 1017  Sun Nov 05, 2023  0824 Basic metabolic panel(!) No profound electrolyte derangements [HD]  0824 CBC No leukocytosis or acute anemia [HD]  0840 Blood gas, venous(!) Not significantly hypercarbic [HD]  0853 D-Dimer, Quant: 0.48 Not elevated will hold off on obtaining a CT PE [HD]  0902 Brain natriuretic peptide(!) This is within the patient's baseline which ranges from 187-400 [HD]  0911 Lipase: 23 No epigastric pain [HD]  0924 Resp panel by RT-PCR (RSV, Flu A&B, Covid) Anterior Nasal Swab Negative URI  [HD]  9074  Patient reassessed and states that she feels no improvement despite the steroids and DuoNeb.  On reexamination of her lungs she still has rhonchi and wheezing.  Will order another albuterol  nebulizing treatment [HD]  0927 DG Chest 2 View What is unremarkable and on my independent review interpretation I agree [HD]  1002 During ambulatory pulse ox: O2 dropped to 87% while ambulating and became very short of breath  [HD]  1010 Patient prefers to be admitted today with discussed with hospitalist [HD]    Clinical Course User Index [HD] Nicholaus Rolland BRAVO, MD   Data(2/3 categories following were  performed): I reviewed or ordered at least three unique tests, external notes, and/or the history required an independent historian as one of the three requirements as following: At least 3 labs/imaging studies were obtained and/or reviewed. AND  I discussed the management of the patient with the following external physician or qualified healthcare provider: Admitting physician  Risk: This patient has a high risk of morbidity due to further diagnostic testing or treatment. Rationale: Decision made regarding admission  Admit Level 5 - Labs/Rads, Admit, Consult: None  Suggested E/M Coding Level: 5, 99285  This level has been selected based on the Nov 20, 2021 CPT guidelines for E/M codes in the Emergency Department based on 2/3 of the CoPA, Data, and Risk.    FINAL CLINICAL IMPRESSION(S) / ED DIAGNOSES   Final diagnoses:  Bronchospasm  Shortness of breath  Right leg pain     Rx / DC Orders   ED Discharge Orders     None        Note:  This document was prepared using Dragon voice recognition software and Honse include unintentional dictation errors.   Nicholaus Rolland BRAVO, MD 11/05/23 1017

## 2023-11-05 NOTE — ED Notes (Signed)
 Patient transported to X-ray

## 2023-11-05 NOTE — Progress Notes (Signed)
 Awaiting pharmacy to verify meds so they can be administered. Nurse has called and sent messages through [facility] as well.

## 2023-11-06 DIAGNOSIS — J45901 Unspecified asthma with (acute) exacerbation: Secondary | ICD-10-CM | POA: Diagnosis present

## 2023-11-06 DIAGNOSIS — Z87891 Personal history of nicotine dependence: Secondary | ICD-10-CM | POA: Diagnosis not present

## 2023-11-06 DIAGNOSIS — Z6841 Body Mass Index (BMI) 40.0 and over, adult: Secondary | ICD-10-CM | POA: Diagnosis not present

## 2023-11-06 DIAGNOSIS — J441 Chronic obstructive pulmonary disease with (acute) exacerbation: Secondary | ICD-10-CM | POA: Diagnosis not present

## 2023-11-06 DIAGNOSIS — K219 Gastro-esophageal reflux disease without esophagitis: Secondary | ICD-10-CM | POA: Diagnosis present

## 2023-11-06 DIAGNOSIS — Z7951 Long term (current) use of inhaled steroids: Secondary | ICD-10-CM | POA: Diagnosis not present

## 2023-11-06 DIAGNOSIS — R0602 Shortness of breath: Secondary | ICD-10-CM | POA: Diagnosis present

## 2023-11-06 DIAGNOSIS — Z79899 Other long term (current) drug therapy: Secondary | ICD-10-CM | POA: Diagnosis not present

## 2023-11-06 DIAGNOSIS — I4891 Unspecified atrial fibrillation: Secondary | ICD-10-CM | POA: Diagnosis present

## 2023-11-06 DIAGNOSIS — I110 Hypertensive heart disease with heart failure: Secondary | ICD-10-CM | POA: Diagnosis present

## 2023-11-06 DIAGNOSIS — Z7985 Long-term (current) use of injectable non-insulin antidiabetic drugs: Secondary | ICD-10-CM | POA: Diagnosis not present

## 2023-11-06 DIAGNOSIS — Z1152 Encounter for screening for COVID-19: Secondary | ICD-10-CM | POA: Diagnosis not present

## 2023-11-06 DIAGNOSIS — G4733 Obstructive sleep apnea (adult) (pediatric): Secondary | ICD-10-CM | POA: Diagnosis present

## 2023-11-06 DIAGNOSIS — I5032 Chronic diastolic (congestive) heart failure: Secondary | ICD-10-CM | POA: Diagnosis present

## 2023-11-06 DIAGNOSIS — Z791 Long term (current) use of non-steroidal anti-inflammatories (NSAID): Secondary | ICD-10-CM | POA: Diagnosis not present

## 2023-11-06 DIAGNOSIS — I252 Old myocardial infarction: Secondary | ICD-10-CM | POA: Diagnosis not present

## 2023-11-06 DIAGNOSIS — E785 Hyperlipidemia, unspecified: Secondary | ICD-10-CM | POA: Diagnosis present

## 2023-11-06 DIAGNOSIS — E039 Hypothyroidism, unspecified: Secondary | ICD-10-CM | POA: Diagnosis present

## 2023-11-06 DIAGNOSIS — J9601 Acute respiratory failure with hypoxia: Secondary | ICD-10-CM | POA: Diagnosis present

## 2023-11-06 DIAGNOSIS — Z7901 Long term (current) use of anticoagulants: Secondary | ICD-10-CM | POA: Diagnosis not present

## 2023-11-06 DIAGNOSIS — Z885 Allergy status to narcotic agent status: Secondary | ICD-10-CM | POA: Diagnosis not present

## 2023-11-06 DIAGNOSIS — I2510 Atherosclerotic heart disease of native coronary artery without angina pectoris: Secondary | ICD-10-CM | POA: Diagnosis present

## 2023-11-06 DIAGNOSIS — Z881 Allergy status to other antibiotic agents status: Secondary | ICD-10-CM | POA: Diagnosis not present

## 2023-11-06 DIAGNOSIS — Z8249 Family history of ischemic heart disease and other diseases of the circulatory system: Secondary | ICD-10-CM | POA: Diagnosis not present

## 2023-11-06 MED ORDER — GABAPENTIN 300 MG PO CAPS
300.0000 mg | ORAL_CAPSULE | Freq: Two times a day (BID) | ORAL | Status: DC
  Administered 2023-11-06 – 2023-11-09 (×6): 300 mg via ORAL
  Filled 2023-11-06 (×6): qty 1

## 2023-11-06 MED ORDER — IPRATROPIUM-ALBUTEROL 0.5-2.5 (3) MG/3ML IN SOLN
3.0000 mL | Freq: Four times a day (QID) | RESPIRATORY_TRACT | Status: DC
  Administered 2023-11-06 – 2023-11-07 (×5): 3 mL via RESPIRATORY_TRACT
  Filled 2023-11-06 (×5): qty 3

## 2023-11-06 MED ORDER — HYDROCOD POLI-CHLORPHE POLI ER 10-8 MG/5ML PO SUER
5.0000 mL | Freq: Two times a day (BID) | ORAL | Status: DC
  Administered 2023-11-06 – 2023-11-09 (×7): 5 mL via ORAL
  Filled 2023-11-06 (×7): qty 5

## 2023-11-06 MED ORDER — METHYLPREDNISOLONE SODIUM SUCC 40 MG IJ SOLR
40.0000 mg | Freq: Two times a day (BID) | INTRAMUSCULAR | Status: DC
  Administered 2023-11-06 – 2023-11-09 (×6): 40 mg via INTRAVENOUS
  Filled 2023-11-06 (×6): qty 1

## 2023-11-06 NOTE — Progress Notes (Signed)
 PROGRESS NOTE    Patricia Walker  FMW:[id number] DOB: Lecker 03, 1947 DOA: 11/05/2023 PCP: [name], FNP    Assessment & Plan:   Principal Problem:   COPD (chronic obstructive pulmonary disease) (HCC) Active Problems:   COPD exacerbation (HCC)  Assessment and Plan: Acute hypoxic respiratory failure: likely secondary to COPD exacerbation. Continue on supplemental oxygen and wean as tolerated. C/o cough, shortness of breath & wheezing   Acute COPD exacerbation: continue on azithromycin , steroids, bronchodilators & encourage incentive spirometry. Resp viral panel was neg   PAF: continue on metoprolol , eliquis    HTN: continue on metoprolol , enalapril    Chronic diastolic CHF: appears compensated. Continue on home lasix , metoprolol , enalapril     Morbid obesity: BMI 51.4. Complicates overall care & prognosis       DVT prophylaxis: eliquis  Code Status: full Family Communication:  Disposition Plan: depends on PT/OT recs  Level of care: Telemetry Medical  Status is: Inpatient Remains inpatient appropriate because: severity of illness, requiring IV steroids      Consultants:    Procedures:   Antimicrobials: azthromycin   Subjective: Pt c/o cough, shortness of breath & wheezing.   Objective: Vitals:   11/05/23 2315 11/06/23 0413 11/06/23 0735 11/06/23 0810  BP:  103/86 138/73   Pulse:  70 (!) 58   Resp:  16 18   Temp:  97.6 F (36.4 C) 97.9 F (36.6 C)   TempSrc:  Oral Oral   SpO2: 98% 97% 100% 100%  Weight:      Height:        Intake/Output Summary (Last 24 hours) at 11/06/2023 0853 Last data filed at 11/05/2023 1900 Gross per 24 hour  Intake 970 ml  Output --  Net 970 ml   Filed Weights   11/05/23 0745  Weight: 136.1 kg    Examination:  General exam: Appears calm but uncomfortable. Morbidly obese Respiratory system: course breath sounds b/l. Wheezes b/l  Cardiovascular system: S1 & S2+. No  rubs, gallops or clicks.  Gastrointestinal  system: Abdomen is obese, soft and nontender. Normal bowel sounds heard. Central nervous system: Alert and oriented. Moves all extremities  Psychiatry: Judgement and insight appear normal. Flat mood and affect    Data Reviewed: I have personally reviewed following labs and imaging studies  CBC: Recent Labs  Lab 11/05/23 0749  WBC 8.2  HGB 14.0  HCT 43.3  MCV 85.4  PLT 312   Basic Metabolic Panel: Recent Labs  Lab 11/05/23 0749 11/05/23 0824  NA 136  --   K 3.8  --   CL 101  --   CO2 25  --   GLUCOSE 133*  --   BUN 10  --   CREATININE 0.96  --   CALCIUM  8.7*  --   MG  --  1.9   GFR: Estimated Creatinine Clearance: 67.6 mL/min (by C-G formula based on SCr of 0.96 mg/dL). Liver Function Tests: No results for input(s): AST, ALT, ALKPHOS, BILITOT, PROT, ALBUMIN in the last 168 hours. Recent Labs  Lab 11/05/23 0749  LIPASE 23   No results for input(s): AMMONIA in the last 168 hours. Coagulation Profile: No results for input(s): INR, PROTIME in the last 168 hours. Cardiac Enzymes: No results for input(s): CKTOTAL, CKMB, CKMBINDEX, TROPONINI in the last 168 hours. BNP (last 3 results) No results for input(s): PROBNP in the last 8760 hours. HbA1C: No results for input(s): HGBA1C in the last 72 hours. CBG: No results for input(s): GLUCAP in the last 168 hours.  Lipid Profile: No results for input(s): CHOL, HDL, LDLCALC, TRIG, CHOLHDL, LDLDIRECT in the last 72 hours. Thyroid  Function Tests: No results for input(s): TSH, T4TOTAL, FREET4, T3FREE, THYROIDAB in the last 72 hours. Anemia Panel: No results for input(s): VITAMINB12, FOLATE, FERRITIN, TIBC, IRON, RETICCTPCT in the last 72 hours. Sepsis Labs: Recent Labs  Lab 11/05/23 0824  PROCALCITON <0.10    Recent Results (from the past 240 hours)  COVID-19, Flu A+B and RSV     Status: None   Collection Time: 11/01/23  4:22 PM  Result Value Ref Range  Status   SARS-CoV-2, NAA Not Detected Not Detected Final   Influenza A, NAA Not Detected Not Detected Final   Influenza B, NAA Not Detected Not Detected Final   RSV, NAA Not Detected Not Detected Final   Test Information: Comment  Final    Comment: This nucleic acid amplification test was developed and its performance characteristics determined by [employer]. Nucleic acid amplification tests include RT-PCR and TMA. This test has not been FDA cleared or approved. This test has been authorized by FDA under an Emergency Use Authorization (EUA). This test is only authorized for the duration of time the declaration that circumstances exist justifying the authorization of the emergency use of in vitro diagnostic tests for detection of SARS-CoV-2 virus and/or diagnosis of COVID-19 infection under section 564(b)(1) of the Act, 21 U.S.C. 639aaa-6(a) (1), unless the authorization is terminated or revoked sooner. When diagnostic testing is negative, the possibility of a false negative result should be considered in the context of a patient's recent exposures and the presence of clinical signs and symptoms consistent with COVID-19. An individual without symptoms of COVID-19 and who is not shedding SARS-CoV-2 virus wo uld expect to have a negative (not detected) result in this assay.   Resp panel by RT-PCR (RSV, Flu A&B, Covid) Anterior Nasal Swab     Status: None   Collection Time: 11/05/23  8:24 AM   Specimen: Anterior Nasal Swab  Result Value Ref Range Status   SARS Coronavirus 2 by RT PCR NEGATIVE NEGATIVE Final    Comment: (NOTE) SARS-CoV-2 target nucleic acids are NOT DETECTED.  The SARS-CoV-2 RNA is generally detectable in upper respiratory specimens during the acute phase of infection. The lowest concentration of SARS-CoV-2 viral copies this assay can detect is 138 copies/mL. A negative result does not preclude SARS-Cov-2 infection and should not be used as the sole basis for  treatment or other patient management decisions. A negative result Scheibel occur with  improper specimen collection/handling, submission of specimen other than nasopharyngeal swab, presence of viral mutation(s) within the areas targeted by this assay, and inadequate number of viral copies(<138 copies/mL). A negative result must be combined with clinical observations, patient history, and epidemiological information. The expected result is Negative.  Fact Sheet for Patients:  BloggerCourse.com  Fact Sheet for Healthcare Providers:  SeriousBroker.it  This test is no t yet approved or cleared by the [place]  FDA and  has been authorized for detection and/or diagnosis of SARS-CoV-2 by FDA under an Emergency Use Authorization (EUA). This EUA will remain  in effect (meaning this test can be used) for the duration of the COVID-19 declaration under Section 564(b)(1) of the Act, 21 U.S.C.section 360bbb-3(b)(1), unless the authorization is terminated  or revoked sooner.       Influenza A by PCR NEGATIVE NEGATIVE Final   Influenza B by PCR NEGATIVE NEGATIVE Final    Comment: (NOTE) The Xpert Xpress SARS-CoV-2/FLU/RSV plus assay  is intended as an aid in the diagnosis of influenza from Nasopharyngeal swab specimens and should not be used as a sole basis for treatment. Nasal washings and aspirates are unacceptable for Xpert Xpress SARS-CoV-2/FLU/RSV testing.  Fact Sheet for Patients: BloggerCourse.com  Fact Sheet for Healthcare Providers: SeriousBroker.it  This test is not yet approved or cleared by the [place]  FDA and has been authorized for detection and/or diagnosis of SARS-CoV-2 by FDA under an Emergency Use Authorization (EUA). This EUA will remain in effect (meaning this test can be used) for the duration of the COVID-19 declaration under Section 564(b)(1) of the Act, 21  U.S.C. section 360bbb-3(b)(1), unless the authorization is terminated or revoked.     Resp Syncytial Virus by PCR NEGATIVE NEGATIVE Final    Comment: (NOTE) Fact Sheet for Patients: BloggerCourse.com  Fact Sheet for Healthcare Providers: SeriousBroker.it  This test is not yet approved or cleared by the [place]  FDA and has been authorized for detection and/or diagnosis of SARS-CoV-2 by FDA under an Emergency Use Authorization (EUA). This EUA will remain in effect (meaning this test can be used) for the duration of the COVID-19 declaration under Section 564(b)(1) of the Act, 21 U.S.C. section 360bbb-3(b)(1), unless the authorization is terminated or revoked.  Performed at [facility], [address] [place], [place] [address]   Respiratory (~20 pathogens) panel by PCR     Status: None   Collection Time: 11/05/23 10:48 AM   Specimen: Nasopharyngeal Swab; Respiratory  Result Value Ref Range Status   Adenovirus NOT DETECTED NOT DETECTED Final   Coronavirus 229E NOT DETECTED NOT DETECTED Final    Comment: (NOTE) The Coronavirus on the Respiratory Panel, DOES NOT test for the novel  Coronavirus (2019 nCoV)    Coronavirus HKU1 NOT DETECTED NOT DETECTED Final   Coronavirus NL63 NOT DETECTED NOT DETECTED Final   Coronavirus OC43 NOT DETECTED NOT DETECTED Final   Metapneumovirus NOT DETECTED NOT DETECTED Final   Rhinovirus / Enterovirus NOT DETECTED NOT DETECTED Final   Influenza A NOT DETECTED NOT DETECTED Final   Influenza B NOT DETECTED NOT DETECTED Final   Parainfluenza Virus 1 NOT DETECTED NOT DETECTED Final   Parainfluenza Virus 2 NOT DETECTED NOT DETECTED Final   Parainfluenza Virus 3 NOT DETECTED NOT DETECTED Final   Parainfluenza Virus 4 NOT DETECTED NOT DETECTED Final   Respiratory Syncytial Virus NOT DETECTED NOT DETECTED Final   Bordetella pertussis NOT DETECTED NOT DETECTED Final   Bordetella  Parapertussis NOT DETECTED NOT DETECTED Final   Chlamydophila pneumoniae NOT DETECTED NOT DETECTED Final   Mycoplasma pneumoniae NOT DETECTED NOT DETECTED Final    Comment: Performed at [facility], [address], [place], [place] [address]  Expectorated Sputum Assessment w Gram Stain, Rflx to Resp Cult     Status: None   Collection Time: 11/05/23 11:03 AM   Specimen: Expectorated Sputum  Result Value Ref Range Status   Specimen Description EXPECTORATED SPUTUM  Final   Special Requests NONE  Final   Sputum evaluation   Final    THIS SPECIMEN IS ACCEPTABLE FOR SPUTUM CULTURE Performed at [facility], [address] [place], [place] [address]    Report Status 11/05/2023 FINAL  Final  Culture, Respiratory w Gram Stain     Status: None (Preliminary result)   Collection Time: 11/05/23 11:03 AM  Result Value Ref Range Status   Specimen Description   Final    EXPECTORATED SPUTUM Performed at [facility], [address], [place], [place] [address]    Special Requests   Final    NONE Reflexed from [id number] Performed at [facility], [address] [place], [place] [address]    Gram Stain   Final    RARE WBC SEEN MODERATE GRAM NEGATIVE RODS MODERATE GRAM POSITIVE COCCI Performed at [facility], [address], [place], [place] [address]    Culture PENDING  Incomplete   Report Status PENDING  Incomplete         Radiology Studies: US  Venous Img Lower Bilateral Result Date: 11/05/2023 CLINICAL DATA:  Right lower extremity pain. EXAM: Bilateral LOWER EXTREMITY VENOUS DOPPLER ULTRASOUND TECHNIQUE: Gray-scale sonography with compression, as well as color and duplex ultrasound, were performed to evaluate the deep venous system(s) from the level of the common femoral vein through the popliteal and proximal calf veins. COMPARISON:  Ultrasound dated 09/08/2020. FINDINGS: VENOUS Normal compressibility of the common femoral, superficial femoral, and  popliteal veins, as well as the visualized calf veins. Visualized portions of profunda femoral vein and great saphenous vein unremarkable. No filling defects to suggest DVT on grayscale or color Doppler imaging. Doppler waveforms show normal direction of venous flow, normal respiratory plasticity and response to augmentation. OTHER None. Limitations: none IMPRESSION: Negative. Electronically Signed   By: Vanetta Chou M.D.   On: 11/05/2023 09:54   DG Chest 2 View Result Date: 11/05/2023 CLINICAL DATA:  Shortness of breath. EXAM: CHEST - 2 VIEW COMPARISON:  08/05/2023 FINDINGS: The cardio pericardial silhouette is enlarged. The lungs are clear without focal pneumonia, edema, pneumothorax or pleural effusion. No acute bony abnormality. Telemetry leads overlie the chest. IMPRESSION: Enlargement of the cardiopericardial silhouette without acute cardiopulmonary findings. Electronically Signed   By: Camellia Candle M.D.   On: 11/05/2023 09:23        Scheduled Meds:  apixaban   5 mg Oral BID   azithromycin   500 mg Oral Daily   budesonide -glycopyrrolate -formoterol   2 puff Inhalation BID   fluticasone   1 spray Each Nare Daily   furosemide   20 mg Oral Daily   gabapentin   300 mg Oral Daily   guaiFENesin   1,200 mg Oral BID   ipratropium-albuterol   3 mL Nebulization QID   methylPREDNISolone  (SOLU-MEDROL ) injection  40 mg Intravenous Q12H   Followed by   NOREEN ON 11/07/2023] predniSONE   40 mg Oral Q breakfast   mirabegron  ER  25 mg Oral Daily   Continuous Infusions:   LOS: 0 days      Anthony CHRISTELLA Pouch, MD [facility] Pager [phone]  If 7PM-7AM, please contact night-coverage [email or web address] 11/06/2023, 8:53 AM

## 2023-11-06 NOTE — Plan of Care (Signed)
  Problem: Health Behavior/Discharge Planning: Goal: Ability to manage health-related needs will improve Outcome: Progressing   Problem: Activity: Goal: Risk for activity intolerance will decrease Outcome: Progressing   Problem: Coping: Goal: Level of anxiety will decrease Outcome: Progressing   Problem: Pain Managment: Goal: General experience of comfort will improve and/or be controlled Outcome: Progressing

## 2023-11-06 NOTE — Plan of Care (Signed)

## 2023-11-07 DIAGNOSIS — J441 Chronic obstructive pulmonary disease with (acute) exacerbation: Secondary | ICD-10-CM | POA: Diagnosis not present

## 2023-11-07 LAB — BASIC METABOLIC PANEL WITH GFR
Anion gap: 7 (ref 5–15)
BUN: 31 mg/dL — ABNORMAL HIGH (ref 8–23)
CO2: 27 mmol/L (ref 22–32)
Calcium: 8.5 mg/dL — ABNORMAL LOW (ref 8.9–10.3)
Chloride: 105 mmol/L (ref 98–111)
Creatinine, Ser: 1.05 mg/dL — ABNORMAL HIGH (ref 0.44–1.00)
GFR, Estimated: 55 mL/min — ABNORMAL LOW (ref >60)
Glucose, Bld: 173 mg/dL — ABNORMAL HIGH (ref 70–99)
Potassium: 5 mmol/L (ref 3.5–5.1)
Sodium: 139 mmol/L (ref 135–145)

## 2023-11-07 LAB — CBC
HCT: 42.6 % (ref 36.0–46.0)
Hemoglobin: 13.5 g/dL (ref 12.0–15.0)
MCH: 27.6 pg (ref 26.0–34.0)
MCHC: 31.7 g/dL (ref 30.0–36.0)
MCV: 87.1 fL (ref 80.0–100.0)
Platelets: 324 K/uL (ref 150–400)
RBC: 4.89 MIL/uL (ref 3.87–5.11)
RDW: 13.2 % (ref 11.5–15.5)
WBC: 8.4 K/uL (ref 4.0–10.5)
nRBC: 0 % (ref 0.0–0.2)

## 2023-11-07 LAB — CULTURE, RESPIRATORY W GRAM STAIN

## 2023-11-07 MED ORDER — IPRATROPIUM-ALBUTEROL 0.5-2.5 (3) MG/3ML IN SOLN
3.0000 mL | Freq: Three times a day (TID) | RESPIRATORY_TRACT | Status: DC
  Administered 2023-11-07 – 2023-11-08 (×3): 3 mL via RESPIRATORY_TRACT
  Filled 2023-11-07 (×3): qty 3

## 2023-11-07 MED ORDER — ENALAPRIL MALEATE 2.5 MG PO TABS
2.5000 mg | ORAL_TABLET | Freq: Every day | ORAL | Status: DC
  Administered 2023-11-08 – 2023-11-09 (×2): 2.5 mg via ORAL
  Filled 2023-11-07 (×2): qty 1

## 2023-11-07 MED ORDER — PANTOPRAZOLE SODIUM 40 MG PO TBEC
40.0000 mg | DELAYED_RELEASE_TABLET | Freq: Two times a day (BID) | ORAL | Status: DC
  Administered 2023-11-07 – 2023-11-09 (×5): 40 mg via ORAL
  Filled 2023-11-07 (×5): qty 1

## 2023-11-07 MED ORDER — AZITHROMYCIN 250 MG PO TABS
250.0000 mg | ORAL_TABLET | Freq: Every day | ORAL | Status: DC
  Administered 2023-11-07 – 2023-11-08 (×2): 250 mg via ORAL
  Filled 2023-11-07 (×2): qty 1

## 2023-11-07 NOTE — Evaluation (Addendum)
 Clinical/Bedside Swallow Evaluation Patient Details  Name: Patricia Walker MRN: [id number] Date of Birth: 1945/05/05  Today's Date: 11/07/2023 Time: SLP Start Time (ACUTE ONLY): 1345 SLP Stop Time (ACUTE ONLY): 1445 SLP Time Calculation (min) (ACUTE ONLY): 60 min  Past Medical History:  Past Medical History:  Diagnosis Date   Anxiety    Arthritis    Asthma    COPD exacerbation (HCC) 10/08/2020   Coronary artery disease    mild, nonobstructive   Depression    Dyspnea    on exertion   Dysrhythmia    Atrial Fibrillation   GERD (gastroesophageal reflux disease)    Heart murmur    Hyperlipidemia    Hypertension    Hypothyroidism    MI, old 2016   nonobstructive CAD by Cath   Morbid obesity (HCC)    Persistent atrial fibrillation (HCC)    Pneumonia 06/2018   and RSV   Psoriasis    Sleep apnea    compliant with CPAP   Thyroid  disease    Past Surgical History:  Past Surgical History:  Procedure Laterality Date   ARTERY BIOPSY Right 07/21/2017   Procedure: BIOPSY TEMPORAL ARTERY;  Surgeon: Jama Cordella MATSU, MD;  Location: [facility];  Service: Vascular;  Laterality: Right;   CARDIAC CATHETERIZATION     CARDIOVERSION Right 09/01/2016   Procedure: Cardioversion;  Surgeon: Fernand Denyse LABOR, MD;  Location: [facility];  Service: Cardiovascular;  Laterality: Right;   CARDIOVERSION N/A 09/09/2016   Procedure: Cardioversion;  Surgeon: Fernand Denyse LABOR, MD;  Location: [facility];  Service: Cardiovascular;  Laterality: N/A;   CATARACT EXTRACTION W/PHACO Right 03/26/2019   Procedure: CATARACT EXTRACTION PHACO AND INTRAOCULAR LENS PLACEMENT (IOC) RIGHT 6.45, 00:39.9;  Surgeon: Jaye Fallow, MD;  Location: [facility];  Service: Ophthalmology;  Laterality: Right;   CATARACT EXTRACTION W/PHACO Left 04/16/2019   Procedure: CATARACT EXTRACTION PHACO AND INTRAOCULAR LENS PLACEMENT (IOC) LEFT;   3.14, 00:24.9;  Surgeon: Jaye Fallow, MD;  Location: [facility];  Service:  Ophthalmology;  Laterality: Left;  sleep apnea-CPAP   COLONOSCOPY WITH PROPOFOL  N/A 08/28/2019   Procedure: COLONOSCOPY WITH PROPOFOL ;  Surgeon: Toledo, Ladell POUR, MD;  Location: [facility];  Service: Gastroenterology;  Laterality: N/A;   ELECTROPHYSIOLOGIC STUDY N/A 02/01/2016   Procedure: CARDIOVERSION;  Surgeon: Denyse LABOR Fernand, MD;  Location: [facility];  Service: Cardiovascular;  Laterality: N/A;   ESOPHAGEAL DILATION     ESOPHAGOGASTRODUODENOSCOPY (EGD) WITH PROPOFOL  N/A 02/06/2017   Procedure: ESOPHAGOGASTRODUODENOSCOPY (EGD) WITH PROPOFOL ;  Surgeon: Therisa Bi, MD;  Location: [facility];  Service: Gastroenterology;  Laterality: N/A;   ESOPHAGOGASTRODUODENOSCOPY (EGD) WITH PROPOFOL  N/A 08/28/2019   Procedure: ESOPHAGOGASTRODUODENOSCOPY (EGD) WITH PROPOFOL ;  Surgeon: Toledo, Ladell POUR, MD;  Location: [facility];  Service: Gastroenterology;  Laterality: N/A;   EUS N/A 02/16/2017   Procedure: FULL UPPER ENDOSCOPIC ULTRASOUND (EUS) RADIAL;  Surgeon: Elta Fonda SQUIBB, MD;  Location: [facility];  Service: Gastroenterology;  Laterality: N/A;   LEFT HEART CATH AND CORONARY ANGIOGRAPHY N/A 09/08/2016   Procedure: Left Heart Cath and Coronary Angiography;  Surgeon: Fernand Denyse LABOR, MD;  Location: [facility];  Service: Cardiovascular;  Laterality: N/A;   US  ECHOCARDIOGRAPHY     HPI:  Pt is a 78 y.o. female presented with worsening of cough, wheezing, shortness of breath. MD assessment includes Acute hypoxic respiratory failure and acute COPD exacerbation.  PMH significant for asthma, GERD w/ Esophagitis, Dilation, and Hiatal Hernia per previous EGD in 2021, DOE, CAD, depression, HTN, MI, PAF,  morbid Obesity, & AFIB.   Chest Imaging: Enlargement of the cardiopericardial silhouette without acute  cardiopulmonary findings; Lungs Clear.    Assessment / Plan / Recommendation  Clinical Impression    Pt seen for BSE this PM. Pt awake, reclined in bed post lunch meal(completed fully). Pt  verbal and talkative. Noted constant hocking and spitting of Phlegm- NSG reported ongoing daily during this admit. Pt A/O x4. Missing All but 2-3 Dentition.  On [place] O2 support- 2L; afebrile, WBC not elevated.    OF NOTE: Pt does strongly endorse s/s of REFLUX and Esophageal phase Dysmotility at home; she reports episodes of Regurgitation even during the night that awaken her w/ coughing and having to spit up/out increased Phlegm(white, bubbly, stringy). She also endorses coughing around/after meals w/ belching. Imaging of Chest: CLear. EGD in 08/2019: reflux esophagitis with no bleeding. Biopsied. Esophageal stenosis. Dilated. 2 cm hiatal hernia.. Pt reported being on Nexium before but insurance wouldn't pay for it so I take something else now- 1x daily. Currently, pt is not ordered for a PPI this admit.    Pt appears to present w/ functional oropharyngeal phase swallowing w/ No overt oropharyngeal phase dysphagia appreciated during oral intake of trials; No neuromuscular swallowing deficits appreciated. Pt consumed po's w/ no overt clinical s/s of aspiration noted HOWEVER Belching w/ Regurgitation of phlegm/food material and Expectoration of Phlegm occurred throughout session.  Pt appears at reduced risk for aspiration from an oropharyngeal phase standpoint following general aspiration precautions. With pt's baseline presentation of REFLUX and episodes of Regurgitation, ANY Dysmotility or Regurgitation of Reflux material can increase risk for aspiration of the Reflux material during Retrograde flow thus impact Voicing and Pulmonary status. Pt described issues of vocal quality change, globus, Regurgitation of foods at home, dry throat clearing, Phlegm, and hacking cough.    Pt sat EOB and fed self several trials of thin liquids Via Cup/Straw, purees, and solid foods moistened w/ No overt clinical s/s of aspiration noted; clear vocal quality b/t trials, no decline in pulmonary status, no cough/throat  clearing. O2 sats 99%. Oral phase appeared [facility] for bolus management and timely A-P transfer/clearing of material. Gumming/mastication appropriate for boluses. OM exam was [employer] for oral clearing; lingual/labial movements. No unilateral weakness. Speech clear.  Noted Belching w/ Phlegm expectorated (x2) post all trials.   Recommend continue a fairly Regular diet w/ more CUT/Moistened foods w/ thin liquids. General aspiration precautions including SMALL bites/sips SLOWLY to aid Esophageal clearing. Rest Breaks during meals/oral intake to allow for Esophageal clearing. REFLUX precautions strongly recommended to lessen chance for Regurgitation -- HOB elevated post meals for ~1 hour and at night when sleeping.  Post discussion, MD has initiated a PPI for pt -- this was discussed w/ pt also.   Recommend pt f/u w/ GI for assessment/management of Esophageal phase Dysmotility and REFLUX and tx as indicated. Discussion and handouts given on REFLUX, impact of REFLUX on swallowing, and ways to help manage REFLUX, and foods/diet. No further acute ST services indicated. MD to reconsult ST services if any new needs while admitted. NSG updated. Pt appreciative of Education information. SLP Visit Diagnosis: Dysphagia, unspecified (R13.10) (Esophageal phase Dysmotility; Missing MOST Dentition)    Aspiration Risk   (reduced from an oropharyngeal phase standpoint)    Diet Recommendation   Thin;Age appropriate regular (cut/moistened foods; REFLUX precautions) = a fairly Regular diet w/ more CUT/Moistened foods w/ thin liquids. General aspiration precautions including sit EOB or in chair for meals and SMALL bites/sips  SLOWLY to aid Esophageal clearing. Rest Breaks during meals/oral intake to allow for Esophageal clearing. REFLUX precautions strongly recommended to lessen chance for Regurgitation -- HOB elevated post meals for ~1 hour and at night when sleeping.   Medication Administration: Whole meds with liquid (1-2 at a  time)    Other  Recommendations Recommended Consults: Consider GI evaluation;Consider esophageal assessment (Dietician support) Oral Care Recommendations: Oral care BID;Patient independent with oral care     Assistance Recommended at Discharge  PRN  Functional Status Assessment Patient has not had a recent decline in their functional status (from an oropharyngeal phase standpoint; Esophageal phase dysmotility indicated)  Frequency and Duration  (n/a)   (n/a)       Prognosis Prognosis for improved oropharyngeal function: Good Barriers to Reach Goals: Time post onset;Severity of deficits Barriers/Prognosis Comment: Esophageal phase Dysmotility; Missing MOST Dentition      Swallow Study   General Date of Onset: 11/05/23 HPI: Pt is a 78 y.o. female presented with worsening of cough, wheezing, shortness of breath. MD assessment includes Acute hypoxic respiratory failure and acute COPD exacerbation.  PMH significant for asthma, GERD w/ Esophagitis, Dilation, and Hiatal Hernia per previous EGD in 2021, DOE, CAD, depression, HTN, MI, PAF, morbid Obesity, & AFIB.   Chest Imaging: Enlargement of the cardiopericardial silhouette without acute  cardiopulmonary findings; Lungs Clear. Type of Study: Bedside Swallow Evaluation Previous Swallow Assessment: none Diet Prior to this Study: Regular;Thin liquids (Level 0) Temperature Spikes Noted: No (wbc 8.4) Respiratory Status: Nasal cannula (2L) History of Recent Intubation: No Behavior/Cognition: Alert;Cooperative;Pleasant mood Oral Cavity Assessment: Within Functional Limits Oral Care Completed by SLP: Recent completion by staff Oral Cavity - Dentition: Missing dentition (most) Vision: Functional for self-feeding Self-Feeding Abilities: Able to feed self;Needs set up Patient Positioning: Upright in bed (EOB) Baseline Vocal Quality: Normal (min gravely) Volitional Cough: Strong Volitional Swallow: Able to elicit    Oral/Motor/Sensory Function  Overall Oral Motor/Sensory Function: Within functional limits   Ice Chips Ice chips: Not tested   Thin Liquid Thin Liquid: Within functional limits Presentation: Self Fed;Cup;Straw (~5-6 ozs total) Other Comments: water, juice    Nectar Thick Nectar Thick Liquid: Not tested   Honey Thick Honey Thick Liquid: Not tested   Puree Puree: Within functional limits Presentation: Self Fed;Spoon (3-4 ozs)   Solid     Solid: Within functional limits Presentation: Self Fed (graham crackers and PB) Other Comments: encouraged moistened foods for ease of Esophageal clearing        Comer Portugal, MS, CCC-SLP Speech Language Pathologist Rehab Services; [facility] [phone] (ascom) Ceilidh Torregrossa 11/07/2023,5:35 PM

## 2023-11-07 NOTE — Progress Notes (Signed)
 PROGRESS NOTE   HPI was taken from Dr. Laurita: Patricia Walker is a 78 y.o. female with medical history significant of asthma/COPD Gold stage II, CAD, PAF on Eliquis , HTN, HLD, hypothyroidism, OSA on CPAP at bedtime, morbid obesity, presented with worsening of cough wheezing shortness of breath.   Symptoms started 5 days ago, patient started develop runny nose, sore throat then followed by new onset of productive cough with clear phlegm, wheezing and shortness of breath and gradually getting worse.  She has been using around-the-clock albuterol  with little help for last 2 days and decided come to the hospital.  Denies any chest pain, no fever or chills.   ED Course: Afebrile, no tachycardia no hypotension O2 saturation 97% on room air however O2 saturation dropped to 86% on room air when ambulating for short distance.  Chest x-ray showed no acute infiltrates, blood work showed hemoglobin 14 WBC 8.2 BUN 10 creatinine 0.9.   Patient was given IV Solu-Medrol  and breathing treatment but still remain symptomatic.    Patricia Walker  FMW:[id number] DOB: [birth year] DOA: 11/05/2023 PCP: [name], FNP    Assessment & Plan:   Principal Problem:   COPD (chronic obstructive pulmonary disease) (HCC) Active Problems:   COPD exacerbation (HCC)  Assessment and Plan: Acute hypoxic respiratory failure: likely secondary to COPD exacerbation. Dyspnea improved slightly but still w/ cough and wheezing   Acute COPD exacerbation: continue on azithromycin , steroids, bronchodilators & encourage incentive spirometry. Continue on tussionex.Resp viral panel was neg   PAF: continue on metoprolol , eliquis     HTN: continue on metoprolol . Restart home dose of enalapril  tomorrow   Chronic diastolic CHF: appears compensated. Continue on metoprolol , lasix . Monitor I/Os. Restart enalapril  tomorrow    Morbid obesity: BMI 51.4. Complicates overall care & prognosis       DVT prophylaxis: eliquis  Code Status:  full Family Communication:  Disposition Plan: depends on PT/OT recs  Level of care: Telemetry Medical  Status is: Inpatient Remains inpatient appropriate because: severity of illness, requiring IV steroids      Consultants:    Procedures:   Antimicrobials: azthromycin   Subjective: Pt c/o cough and wheezing    Objective: Vitals:   11/06/23 1941 11/06/23 2110 11/07/23 0350 11/07/23 0723  BP: 111/69  (!) 142/86 (!) 153/86  Pulse: 69  76 68  Resp: 19  20 16   Temp: 98.3 F (36.8 C)  97.9 F (36.6 C) 97.9 F (36.6 C)  TempSrc:   Oral Oral  SpO2: 98% 98% 96% 96%  Weight:      Height:        Intake/Output Summary (Last 24 hours) at 11/07/2023 0915 Last data filed at 11/06/2023 2050 Gross per 24 hour  Intake 360 ml  Output --  Net 360 ml   Filed Weights   11/05/23 0745  Weight: 136.1 kg    Examination:  General exam: Appears comfortable. Morbidly obese Respiratory system: course breath sounds b/l. Wheezes b/l Cardiovascular system: S1/S2+. No rubs or clicks  Gastrointestinal system: abd is soft, NT, obese & hypoactive bowel sounds. Central nervous system: alert & oriented. Moves all extremities   Psychiatry: judgement and insight appears normal. Appropriate mood and affect    Data Reviewed: I have personally reviewed following labs and imaging studies  CBC: Recent Labs  Lab 11/05/23 0749 11/07/23 0339  WBC 8.2 8.4  HGB 14.0 13.5  HCT 43.3 42.6  MCV 85.4 87.1  PLT 312 324   Basic Metabolic Panel: Recent Labs  Lab 11/05/23 0749 11/05/23 0824 11/07/23 0339  NA 136  --  139  K 3.8  --  5.0  CL 101  --  105  CO2 25  --  27  GLUCOSE 133*  --  173*  BUN 10  --  31*  CREATININE 0.96  --  1.05*  CALCIUM  8.7*  --  8.5*  MG  --  1.9  --    GFR: Estimated Creatinine Clearance: 61.8 mL/min (A) (by C-G formula based on SCr of 1.05 mg/dL (H)). Liver Function Tests: No results for input(s): AST, ALT, ALKPHOS, BILITOT, PROT, ALBUMIN in  the last 168 hours. Recent Labs  Lab 11/05/23 0749  LIPASE 23   No results for input(s): AMMONIA in the last 168 hours. Coagulation Profile: No results for input(s): INR, PROTIME in the last 168 hours. Cardiac Enzymes: No results for input(s): CKTOTAL, CKMB, CKMBINDEX, TROPONINI in the last 168 hours. BNP (last 3 results) No results for input(s): PROBNP in the last 8760 hours. HbA1C: No results for input(s): HGBA1C in the last 72 hours. CBG: No results for input(s): GLUCAP in the last 168 hours. Lipid Profile: No results for input(s): CHOL, HDL, LDLCALC, TRIG, CHOLHDL, LDLDIRECT in the last 72 hours. Thyroid  Function Tests: No results for input(s): TSH, T4TOTAL, FREET4, T3FREE, THYROIDAB in the last 72 hours. Anemia Panel: No results for input(s): VITAMINB12, FOLATE, FERRITIN, TIBC, IRON, RETICCTPCT in the last 72 hours. Sepsis Labs: Recent Labs  Lab 11/05/23 0824  PROCALCITON <0.10    Recent Results (from the past 240 hours)  COVID-19, Flu A+B and RSV     Status: None   Collection Time: 11/01/23  4:22 PM  Result Value Ref Range Status   SARS-CoV-2, NAA Not Detected Not Detected Final   Influenza A, NAA Not Detected Not Detected Final   Influenza B, NAA Not Detected Not Detected Final   RSV, NAA Not Detected Not Detected Final   Test Information: Comment  Final    Comment: This nucleic acid amplification test was developed and its performance characteristics determined by [employer]. Nucleic acid amplification tests include RT-PCR and TMA. This test has not been FDA cleared or approved. This test has been authorized by FDA under an Emergency Use Authorization (EUA). This test is only authorized for the duration of time the declaration that circumstances exist justifying the authorization of the emergency use of in vitro diagnostic tests for detection of SARS-CoV-2 virus and/or diagnosis of COVID-19  infection under section 564(b)(1) of the Act, 21 U.S.C. 639aaa-6(a) (1), unless the authorization is terminated or revoked sooner. When diagnostic testing is negative, the possibility of a false negative result should be considered in the context of a patient's recent exposures and the presence of clinical signs and symptoms consistent with COVID-19. An individual without symptoms of COVID-19 and who is not shedding SARS-CoV-2 virus wo uld expect to have a negative (not detected) result in this assay.   Resp panel by RT-PCR (RSV, Flu A&B, Covid) Anterior Nasal Swab     Status: None   Collection Time: 11/05/23  8:24 AM   Specimen: Anterior Nasal Swab  Result Value Ref Range Status   SARS Coronavirus 2 by RT PCR NEGATIVE NEGATIVE Final    Comment: (NOTE) SARS-CoV-2 target nucleic acids are NOT DETECTED.  The SARS-CoV-2 RNA is generally detectable in upper respiratory specimens during the acute phase of infection. The lowest concentration of SARS-CoV-2 viral copies this assay can detect is 138 copies/mL. A negative result does  not preclude SARS-Cov-2 infection and should not be used as the sole basis for treatment or other patient management decisions. A negative result [place] occur with  improper specimen collection/handling, submission of specimen other than nasopharyngeal swab, presence of viral mutation(s) within the areas targeted by this assay, and inadequate number of viral copies(<138 copies/mL). A negative result must be combined with clinical observations, patient history, and epidemiological information. The expected result is Negative.  Fact Sheet for Patients:  BloggerCourse.com  Fact Sheet for Healthcare Providers:  SeriousBroker.it  This test is no t yet approved or cleared by the [place]  FDA and  has been authorized for detection and/or diagnosis of SARS-CoV-2 by FDA under an Emergency Use Authorization (EUA).  This EUA will remain  in effect (meaning this test can be used) for the duration of the COVID-19 declaration under Section 564(b)(1) of the Act, 21 U.S.C.section 360bbb-3(b)(1), unless the authorization is terminated  or revoked sooner.       Influenza A by PCR NEGATIVE NEGATIVE Final   Influenza B by PCR NEGATIVE NEGATIVE Final    Comment: (NOTE) The Xpert Xpress SARS-CoV-2/FLU/RSV plus assay is intended as an aid in the diagnosis of influenza from Nasopharyngeal swab specimens and should not be used as a sole basis for treatment. Nasal washings and aspirates are unacceptable for Xpert Xpress SARS-CoV-2/FLU/RSV testing.  Fact Sheet for Patients: BloggerCourse.com  Fact Sheet for Healthcare Providers: SeriousBroker.it  This test is not yet approved or cleared by the [place]  FDA and has been authorized for detection and/or diagnosis of SARS-CoV-2 by FDA under an Emergency Use Authorization (EUA). This EUA will remain in effect (meaning this test can be used) for the duration of the COVID-19 declaration under Section 564(b)(1) of the Act, 21 U.S.C. section 360bbb-3(b)(1), unless the authorization is terminated or revoked.     Resp Syncytial Virus by PCR NEGATIVE NEGATIVE Final    Comment: (NOTE) Fact Sheet for Patients: BloggerCourse.com  Fact Sheet for Healthcare Providers: SeriousBroker.it  This test is not yet approved or cleared by the [place]  FDA and has been authorized for detection and/or diagnosis of SARS-CoV-2 by FDA under an Emergency Use Authorization (EUA). This EUA will remain in effect (meaning this test can be used) for the duration of the COVID-19 declaration under Section 564(b)(1) of the Act, 21 U.S.C. section 360bbb-3(b)(1), unless the authorization is terminated or revoked.  Performed at [facility], [address]  [place], [place] [address]   Respiratory (~20 pathogens) panel by PCR     Status: None   Collection Time: 11/05/23 10:48 AM   Specimen: Nasopharyngeal Swab; Respiratory  Result Value Ref Range Status   Adenovirus NOT DETECTED NOT DETECTED Final   Coronavirus 229E NOT DETECTED NOT DETECTED Final    Comment: (NOTE) The Coronavirus on the Respiratory Panel, DOES NOT test for the novel  Coronavirus (2019 nCoV)    Coronavirus HKU1 NOT DETECTED NOT DETECTED Final   Coronavirus NL63 NOT DETECTED NOT DETECTED Final   Coronavirus OC43 NOT DETECTED NOT DETECTED Final   Metapneumovirus NOT DETECTED NOT DETECTED Final   Rhinovirus / Enterovirus NOT DETECTED NOT DETECTED Final   Influenza A NOT DETECTED NOT DETECTED Final   Influenza B NOT DETECTED NOT DETECTED Final   Parainfluenza Virus 1 NOT DETECTED NOT DETECTED Final   Parainfluenza Virus 2 NOT DETECTED NOT DETECTED Final   Parainfluenza Virus 3 NOT DETECTED NOT DETECTED Final   Parainfluenza Virus 4 NOT DETECTED NOT DETECTED Final  Respiratory Syncytial Virus NOT DETECTED NOT DETECTED Final   Bordetella pertussis NOT DETECTED NOT DETECTED Final   Bordetella Parapertussis NOT DETECTED NOT DETECTED Final   Chlamydophila pneumoniae NOT DETECTED NOT DETECTED Final   Mycoplasma pneumoniae NOT DETECTED NOT DETECTED Final    Comment: Performed at [facility], [address], [place], [place] [address]  Expectorated Sputum Assessment w Gram Stain, Rflx to Resp Cult     Status: None   Collection Time: 11/05/23 11:03 AM   Specimen: Expectorated Sputum  Result Value Ref Range Status   Specimen Description EXPECTORATED SPUTUM  Final   Special Requests NONE  Final   Sputum evaluation   Final    THIS SPECIMEN IS ACCEPTABLE FOR SPUTUM CULTURE Performed at [facility], [address], [place], [place] [address]    Report Status 11/05/2023 FINAL  Final  Culture, Respiratory w Gram Stain     Status: None (Preliminary result)    Collection Time: 11/05/23 11:03 AM  Result Value Ref Range Status   Specimen Description   Final    EXPECTORATED SPUTUM Performed at [facility], [address] [place], [place] [address]    Special Requests   Final    NONE Reflexed from [id number] Performed at [facility], [address] [place], [place] [address]    Gram Stain   Final    RARE WBC SEEN MODERATE GRAM NEGATIVE RODS MODERATE GRAM POSITIVE COCCI    Culture   Final    CULTURE REINCUBATED FOR BETTER GROWTH Performed at [facility], [address], [place], [place] [address]    Report Status PENDING  Incomplete         Radiology Studies: No results found.       Scheduled Meds:  apixaban   5 mg Oral BID   azithromycin   500 mg Oral Daily   budesonide -glycopyrrolate -formoterol   2 puff Inhalation BID   chlorpheniramine-HYDROcodone   5 mL Oral Q12H   fluticasone   1 spray Each Nare Daily   furosemide   20 mg Oral Daily   gabapentin   300 mg Oral BID   guaiFENesin   1,200 mg Oral BID   ipratropium-albuterol   3 mL Nebulization QID   methylPREDNISolone  (SOLU-MEDROL ) injection  40 mg Intravenous Q12H   mirabegron  ER  25 mg Oral Daily   Continuous Infusions:   LOS: 1 day      Anthony CHRISTELLA Pouch, MD [facility] Pager [phone]  If 7PM-7AM, please contact night-coverage [email or web address] 11/07/2023, 9:15 AM

## 2023-11-07 NOTE — Plan of Care (Signed)

## 2023-11-07 NOTE — Evaluation (Signed)
 Occupational Therapy Evaluation Patient Details Name: Patricia Walker MRN: [id number] DOB: [birth year] Today's Date: 11/07/2023   History of Present Illness   Pt is a 78 y.o. female presented with worsening of cough, wheezing, shortness of breath. MD assessment includes Acute hypoxic respiratory failure and acute COPD exacerbation. PMH significant for asthma, DOE, CAD, depression, HTN, MI, PAF, morbid obesity, & AFIB     Clinical Impressions Pt was seen for OT evaluation this date. PTA, pt resides in a one level home with her grandson with 3 STE. Reports she is MOD I to IND with ADLs, IADLs and mobility with intermittent use of a SPC when her knees are bothering her.  Pt presents to acute OT demonstrating impaired ADL performance and functional mobility 2/2 low activity tolerance and mild weakness. Pt currently requires MOD I for bed mobility, supervision for STS and CGA/SBA for ambulation using SPC within the room and in the hallway ~80 ft total with no LOB noted. Pt initially on 2L, but was placed on RA with sp02 stable throughout session at 97%. Pt performed all grooming ADLs standing at sink with supervision and unilateral support. She was left seated in recliner at end of session. Will follow acutely for further education on pacing and ECS to implement during ADL performance to prevent overexertion.  Do not anticipate the need for follow up OT services upon acute hospital DC.      If plan is discharge home, recommend the following:   A little help with walking and/or transfers;A little help with bathing/dressing/bathroom     Functional Status Assessment   Patient has had a recent decline in their functional status and demonstrates the ability to make significant improvements in function in a reasonable and predictable amount of time.     Equipment Recommendations   None recommended by OT     Recommendations for Other Services         Precautions/Restrictions          Mobility Bed Mobility Overal bed mobility: Modified Independent                  Transfers Overall transfer level: Modified independent Equipment used: Straight cane               General transfer comment: supervision for STS and CGA/SBA for ambulation ~80 ft using SPC with no LOB      Balance Overall balance assessment: Needs assistance Sitting-balance support: Feet supported Sitting balance-Leahy Scale: Good     Standing balance support: During functional activity, Single extremity supported Standing balance-Leahy Scale: Good Standing balance comment: use of SPC, able to stand at sink with unilateral support on sink to perform ADLs                           ADL either performed or assessed with clinical judgement   ADL Overall ADL's : Needs assistance/impaired     Grooming: Wash/dry hands;Wash/dry face;Oral care;Brushing hair;Standing;Supervision/safety                               Functional mobility during ADLs: Contact guard assist;Supervision/safety;Cane       Vision         Perception         Praxis         Pertinent Vitals/Pain Pain Assessment Pain Assessment: No/denies pain     Extremity/Trunk Assessment Upper Extremity Assessment  Upper Extremity Assessment: Overall WFL for tasks assessed   Lower Extremity Assessment Lower Extremity Assessment: Generalized weakness       Communication Communication Communication: No apparent difficulties   Cognition Arousal: Alert Behavior During Therapy: WFL for tasks assessed/performed Cognition: No apparent impairments                               Following commands: Intact       Cueing  General Comments   Cueing Techniques: Verbal cues      Exercises Other Exercises Other Exercises: Edu on role of OT in acute setting.   Shoulder Instructions      Home Living Family/patient expects to be discharged to:: Private residence Living  Arrangements: Other relatives (68 year old grandson who works during the day 11am-5pm 4 days/wk) Available Help at Discharge: Family Type of Home: House Home Access: Stairs to enter Secretary/administrator of Steps: 3 Entrance Stairs-Rails: Right;Left Home Layout: One level     Bathroom Shower/Tub: Chief Strategy Officer: Standard     Home Equipment: Agricultural consultant (2 wheels);Cane - single point;Rollator (4 wheels)          Prior Functioning/Environment Prior Level of Function : Independent/Modified Independent;Driving             Mobility Comments: intermittent use of SPC; has been doing well enough to walk through grocery store and not ride motorized cart after cortisone injections to bil knees; denies falls ADLs Comments: independent ADLs, IADLs; great niece comes over to help sometimes    OT Problem List: Decreased activity tolerance;Decreased strength   OT Treatment/Interventions: Self-care/ADL training;Therapeutic exercise;Patient/family education;Balance training;Therapeutic activities;Energy conservation;DME and/or AE instruction      OT Goals(Current goals can be found in the care plan section)   Acute Rehab OT Goals Patient Stated Goal: improve breathing and return home OT Goal Formulation: With patient Time For Goal Achievement: 11/21/23 Potential to Achieve Goals: Good ADL Goals Pt Will Perform Lower Body Dressing: with modified independence;sit to/from stand;sitting/lateral leans Pt Will Transfer to Toilet: with modified independence;regular height toilet;ambulating Additional ADL Goal #1: Pt will demo implementation of 1 learned ECS during ADL performance 2/2 trials to maximize safety/IND and prevent overexertion.   OT Frequency:  Min 2X/week    Co-evaluation              AM-PAC OT 6 Clicks Daily Activity     Outcome Measure Help from another person eating meals?: None Help from another person taking care of personal grooming?:  None Help from another person toileting, which includes using toliet, bedpan, or urinal?: A Little Help from another person bathing (including washing, rinsing, drying)?: A Little Help from another person to put on and taking off regular upper body clothing?: None Help from another person to put on and taking off regular lower body clothing?: A Little 6 Click Score: 21   End of Session Equipment Utilized During Treatment:  [facility]) Nurse Communication: Mobility status  Activity Tolerance: Patient tolerated treatment well Patient left: in chair;with call bell/phone within reach;with chair alarm set  OT Visit Diagnosis: Other abnormalities of gait and mobility (R26.89)                Time: 9155-9088 OT Time Calculation (min): 27 min Charges:  OT General Charges $OT Visit: 1 Visit OT Evaluation $OT Eval Low Complexity: 1 Low OT Treatments $Self Care/Home Management : 8-22 mins Charrie Mcconnon, OTR/L 11/07/23,  11:12 AM  Ly Wass E Orphia Mctigue 11/07/2023, 11:09 AM

## 2023-11-07 NOTE — Evaluation (Signed)
 Physical Therapy Evaluation Patient Details Name: Patricia Walker MRN: [id number] DOB: [birth year] Today's Date: 11/07/2023  History of Present Illness  Pt is a 78 y.o. female presented with worsening of cough, wheezing, shortness of breath. MD assessment includes Acute hypoxic respiratory failure and acute COPD exacerbation. PMH significant for asthma, DOE, CAD, depression, HTN, MI, PAF, morbid obesity, & AFIB  Clinical Impression  Patient admitted with the above. PTA, patient lives with grandson and was modI with use of SPC intermittently. Patient presents with weakness, impaired balance, and decreased activity tolerance. Able to stand modI with SPC. Ambulated 100' with SPC and supervision but demonstrates mild balance deficits. Negotiated 3 stairs with L hand rail and SPC along with supervision. On RA throughout with spO2 >90%. Patient will benefit from skilled PT services during acute stay to address listed deficits. Patient will benefit from ongoing therapy at discharge to maximize functional independence and safety.         If plan is discharge home, recommend the following: A little help with bathing/dressing/bathroom;Assistance with cooking/housework;Assist for transportation;Help with stairs or ramp for entrance   Can travel by private vehicle        Equipment Recommendations None recommended by PT  Recommendations for Other Services       Functional Status Assessment Patient has had a recent decline in their functional status and demonstrates the ability to make significant improvements in function in a reasonable and predictable amount of time.     Precautions / Restrictions Precautions Precautions: Fall Recall of Precautions/Restrictions: Intact Restrictions Weight Bearing Restrictions Per Provider Order: No      Mobility  Bed Mobility               General bed mobility comments: in recliner on arrival    Transfers Overall transfer level: Modified  independent Equipment used: Straight cane                    Ambulation/Gait Ambulation/Gait assistance: Supervision Gait Distance (Feet): 100 Feet Assistive device: Straight cane Gait Pattern/deviations: Step-to pattern, Decreased stride length Gait velocity: decreased     General Gait Details: supervision for safety. No overt LOB. 3/4 DOE with spO2 >90% throughout  Stairs Stairs: Yes Stairs assistance: Supervision Stair Management: One rail Left, Step to pattern, With cane, Forwards Number of Stairs: 3    Wheelchair Mobility     Tilt Bed    Modified Rankin (Stroke Patients Only)       Balance Overall balance assessment: Needs assistance Sitting-balance support: Feet supported Sitting balance-Leahy Scale: Good     Standing balance support: During functional activity, Single extremity supported Standing balance-Leahy Scale: Good                               Pertinent Vitals/Pain Pain Assessment Pain Assessment: No/denies pain    Home Living Family/patient expects to be discharged to:: Private residence Living Arrangements: Other relatives (26 year old grandson who works during the day 11am-5pm 4 days/wk) Available Help at Discharge: Family Type of Home: House Home Access: Stairs to enter Entrance Stairs-Rails: Doctor, general practice of Steps: 3   Home Layout: One level Home Equipment: Agricultural consultant (2 wheels);Cane - single point;Rollator (4 wheels)      Prior Function Prior Level of Function : Independent/Modified Independent;Driving             Mobility Comments: intermittent use of SPC; has been doing well enough to  walk through grocery store and not ride motorized cart after cortisone injections to bil knees; denies falls ADLs Comments: independent ADLs, IADLs; great niece comes over to help sometimes     Extremity/Trunk Assessment   Upper Extremity Assessment Upper Extremity Assessment: Defer to OT  evaluation    Lower Extremity Assessment Lower Extremity Assessment: Generalized weakness    Cervical / Trunk Assessment Cervical / Trunk Assessment: Normal  Communication   Communication Communication: No apparent difficulties    Cognition Arousal: Alert Behavior During Therapy: WFL for tasks assessed/performed   PT - Cognitive impairments: No apparent impairments                         Following commands: Intact       Cueing       General Comments      Exercises     Assessment/Plan    PT Assessment Patient needs continued PT services  PT Problem List Decreased strength;Decreased activity tolerance;Decreased balance;Decreased mobility;Decreased safety awareness;Decreased knowledge of use of DME;Decreased knowledge of precautions;Cardiopulmonary status limiting activity       PT Treatment Interventions DME instruction;Gait training;Stair training;Functional mobility training;Therapeutic activities;Balance training;Therapeutic exercise;Patient/family education    PT Goals (Current goals can be found in the Care Plan section)  Acute Rehab PT Goals Patient Stated Goal: to go home PT Goal Formulation: With patient Time For Goal Achievement: 11/21/23 Potential to Achieve Goals: Good    Frequency Min 1X/week     Co-evaluation               AM-PAC PT 6 Clicks Mobility  Outcome Measure Help needed turning from your back to your side while in a flat bed without using bedrails?: None Help needed moving from lying on your back to sitting on the side of a flat bed without using bedrails?: None Help needed moving to and from a bed to a chair (including a wheelchair)?: None Help needed standing up from a chair using your arms (e.g., wheelchair or bedside chair)?: None Help needed to walk in hospital room?: A Little Help needed climbing 3-5 steps with a railing? : A Little 6 Click Score: 22    End of Session   Activity Tolerance: Patient tolerated  treatment well Patient left: in chair;with call bell/phone within reach Nurse Communication: Mobility status PT Visit Diagnosis: Unsteadiness on feet (R26.81);Muscle weakness (generalized) (M62.81)    Time: 8955-8943 PT Time Calculation (min) (ACUTE ONLY): 12 min   Charges:   PT Evaluation $PT Eval Moderate Complexity: 1 Mod   PT General Charges $$ ACUTE PT VISIT: 1 Visit         Maryanne Finder, PT, DPT Physical Therapist - [facility]  [facility]   Bralyn Espino A Gerard Cantara 11/07/2023, 1:34 PM

## 2023-11-08 DIAGNOSIS — J441 Chronic obstructive pulmonary disease with (acute) exacerbation: Secondary | ICD-10-CM | POA: Diagnosis not present

## 2023-11-08 LAB — CBC
HCT: 42.9 % (ref 36.0–46.0)
Hemoglobin: 13.7 g/dL (ref 12.0–15.0)
MCH: 27.6 pg (ref 26.0–34.0)
MCHC: 31.9 g/dL (ref 30.0–36.0)
MCV: 86.5 fL (ref 80.0–100.0)
Platelets: 375 K/uL (ref 150–400)
RBC: 4.96 MIL/uL (ref 3.87–5.11)
RDW: 13.2 % (ref 11.5–15.5)
WBC: 9.8 K/uL (ref 4.0–10.5)
nRBC: 0 % (ref 0.0–0.2)

## 2023-11-08 LAB — BASIC METABOLIC PANEL WITH GFR
Anion gap: 11 (ref 5–15)
BUN: 30 mg/dL — ABNORMAL HIGH (ref 8–23)
CO2: 25 mmol/L (ref 22–32)
Calcium: 8.8 mg/dL — ABNORMAL LOW (ref 8.9–10.3)
Chloride: 98 mmol/L (ref 98–111)
Creatinine, Ser: 1.17 mg/dL — ABNORMAL HIGH (ref 0.44–1.00)
GFR, Estimated: 48 mL/min — ABNORMAL LOW (ref >60)
Glucose, Bld: 217 mg/dL — ABNORMAL HIGH (ref 70–99)
Potassium: 4.5 mmol/L (ref 3.5–5.1)
Sodium: 134 mmol/L — ABNORMAL LOW (ref 135–145)

## 2023-11-08 MED ORDER — IPRATROPIUM-ALBUTEROL 0.5-2.5 (3) MG/3ML IN SOLN
3.0000 mL | Freq: Two times a day (BID) | RESPIRATORY_TRACT | Status: DC
  Administered 2023-11-08 – 2023-11-09 (×2): 3 mL via RESPIRATORY_TRACT
  Filled 2023-11-08 (×2): qty 3

## 2023-11-08 MED ORDER — AMOXICILLIN-POT CLAVULANATE 875-125 MG PO TABS
1.0000 | ORAL_TABLET | Freq: Two times a day (BID) | ORAL | Status: DC
  Administered 2023-11-08 – 2023-11-09 (×3): 1 via ORAL
  Filled 2023-11-08 (×3): qty 1

## 2023-11-08 NOTE — Plan of Care (Signed)

## 2023-11-08 NOTE — Progress Notes (Addendum)
 Progress Note    Patricia Walker  FMW:[id number] DOB: 11/26/1945  DOA: 11/05/2023 PCP: [name], FNP      Brief Narrative:    Medical records reviewed and are as summarized below:  Tilton BROCKS Walker is a 78 y.o. female  with medical history significant of asthma/COPD Gold stage II, CAD, PAF on Eliquis , HTN, HLD, hypothyroidism, OSA on CPAP at bedtime, morbid obesity, who presented to the hospital with cough, wheezing and shortness of breath.  Symptoms started about 5 days prior to admission.  She initially developed a runny nose and sore throat and this was followed by productive cough, wheezing and shortness of breath which progressively got worse.  She had used her albuterol  around-the-clock without any improvement.  In the ED, vital signs were significant for oxygen saturation of 86% on room air.  Chest x-ray did not show any acute infiltrates.      Assessment/Plan:   Principal Problem:   COPD (chronic obstructive pulmonary disease) (HCC) Active Problems:   COPD exacerbation (HCC)    Body mass index is 51.49 kg/m.  (Morbid obesity)   Acute hypoxic respiratory failure: Resolved. Pulse oximetry was done because of reports of shortness of breath with exertion.  Oxygen saturation was 92% on room air with ambulation according to [facility], Psychologist, sport and exercise.    Periodic bronchospasm, reactive airway disease from smoking in the past: Continue IV Solu-Medrol  and bronchodilators. Sputum culture showed haemophilus influenzae.  This Cobarrubias be a colonizer.  Discontinue azithromycin  and start Augmentin .  Chart review showed that she saw Dr. Theotis, pulmonologist on 10/20/2023. COPD diagnosis is doubtful based on recent spirometry results.  However, patient has history of significant tobacco use disorder in the past though she quit about 10 years ago.  SPIROMETRY: FVC was 2.08 L, 77 % of predicted FEV1 was 1.75 L, 85 % of predicted FEV1/FVC ratio was 109 % of predicted FEF 25-75%  liters per second was 127 % of predicted  LUNG VOLUMES: TLC was 80 % of predicted RV was 90 % of predicted  DIFFUSION CAPACITY: DLCO was 77 % of predicted DLCO/VA was 104 % of predicted  Good patient effort with good repeatability.  Interpretation: Fvc is slightly decreased, otherqwise normal Lung volumes are in normal range Dlco is slightly decreased, dlco/va is in normal range      Chronic diastolic CHF, hypertension, CAD: Compensated.  Continue Lasix  and enalapril    Paroxysmal atrial fibrillation: Continue Eliquis    OSA on CPAP but not wearing it as she should  Diet Order             Diet Heart Room service appropriate? Yes with Assist; Fluid consistency: Thin  Diet effective now                            Consultants: None  Procedures: None    Medications:    amoxicillin -clavulanate  1 tablet Oral Q12H   apixaban   5 mg Oral BID   budesonide -glycopyrrolate -formoterol   2 puff Inhalation BID   chlorpheniramine-HYDROcodone   5 mL Oral Q12H   enalapril   2.5 mg Oral Daily   fluticasone   1 spray Each Nare Daily   furosemide   20 mg Oral Daily   gabapentin   300 mg Oral BID   guaiFENesin   1,200 mg Oral BID   ipratropium-albuterol   3 mL Nebulization BID   methylPREDNISolone  (SOLU-MEDROL ) injection  40 mg Intravenous Q12H   mirabegron  ER  25 mg  Oral Daily   pantoprazole   40 mg Oral BID   Continuous Infusions:   Anti-infectives (From admission, onward)    Start     Dose/Rate Route Frequency Ordered Stop   11/08/23 1115  amoxicillin -clavulanate (AUGMENTIN ) 875-125 MG per tablet 1 tablet        1 tablet Oral Every 12 hours 11/08/23 1024 11/13/23 0959   11/07/23 1645  azithromycin  (ZITHROMAX ) tablet 250 mg  Status:  Discontinued        250 mg Oral Daily 11/07/23 1557 11/08/23 1024   11/06/23 1045  azithromycin  (ZITHROMAX ) tablet 500 mg       Placed in Followed by Linked Group   500 mg Oral Daily 11/05/23 1037 11/07/23 0927   11/05/23 1045   azithromycin  (ZITHROMAX ) 500 mg in sodium chloride  0.9 % 250 mL IVPB       Placed in Followed by Linked Group   500 mg 250 mL/hr over 60 Minutes Intravenous Every 24 hours 11/05/23 1037 11/05/23 1357              Family Communication/Anticipated D/C date and plan/Code Status   DVT prophylaxis:  apixaban  (ELIQUIS ) tablet 5 mg     Code Status: Full Code  Family Communication: None Disposition Plan: Plan to discharge home   Status is: Inpatient Remains inpatient appropriate because: COPD exacerbation       Subjective:   Interval events  noted.  She complains of cough productive of thick yellow sputum and shortness of breath with exertion.  Objective:    Vitals:   11/08/23 0219 11/08/23 0237 11/08/23 0754 11/08/23 0757  BP: (!) 155/83 134/70 (!) 129/98   Pulse: 62 98 68   Resp: 20 20 18    Temp: 97.7 F (36.5 C) 97.9 F (36.6 C) 98 F (36.7 C)   TempSrc:   Oral   SpO2: 92% 100% 96% 96%  Weight:      Height:       No data found.   Intake/Output Summary (Last 24 hours) at 11/08/2023 1439 Last data filed at 11/08/2023 1300 Gross per 24 hour  Intake 480 ml  Output --  Net 480 ml   Filed Weights   11/05/23 0745  Weight: 136.1 kg    Exam:  GEN: NAD SKIN: Warm and dry EYES: No pallor or icterus ENT: MMM CV: RRR PULM: CTA B ABD: soft, obese, NT, +BS CNS: AAO x 3, non focal EXT: No edema or tenderness        Data Reviewed:   I have personally reviewed following labs and imaging studies:  Labs: Labs show the following:   Basic Metabolic Panel: Recent Labs  Lab 11/05/23 0749 11/05/23 0824 11/07/23 0339 11/08/23 0540  NA 136  --  139 134*  K 3.8  --  5.0 4.5  CL 101  --  105 98  CO2 25  --  27 25  GLUCOSE 133*  --  173* 217*  BUN 10  --  31* 30*  CREATININE 0.96  --  1.05* 1.17*  CALCIUM  8.7*  --  8.5* 8.8*  MG  --  1.9  --   --    GFR Estimated Creatinine Clearance: 55.5 mL/min (A) (by C-G formula based on SCr of 1.17  mg/dL (H)). Liver Function Tests: No results for input(s): AST, ALT, ALKPHOS, BILITOT, PROT, ALBUMIN in the last 168 hours. Recent Labs  Lab 11/05/23 0749  LIPASE 23   No results for input(s): AMMONIA in the last 168 hours. Coagulation  profile No results for input(s): INR, PROTIME in the last 168 hours.  CBC: Recent Labs  Lab 11/05/23 0749 11/07/23 0339 11/08/23 0540  WBC 8.2 8.4 9.8  HGB 14.0 13.5 13.7  HCT 43.3 42.6 42.9  MCV 85.4 87.1 86.5  PLT 312 324 375   Cardiac Enzymes: No results for input(s): CKTOTAL, CKMB, CKMBINDEX, TROPONINI in the last 168 hours. BNP (last 3 results) No results for input(s): PROBNP in the last 8760 hours. CBG: No results for input(s): GLUCAP in the last 168 hours. D-Dimer: No results for input(s): DDIMER in the last 72 hours. Hgb A1c: No results for input(s): HGBA1C in the last 72 hours. Lipid Profile: No results for input(s): CHOL, HDL, LDLCALC, TRIG, CHOLHDL, LDLDIRECT in the last 72 hours. Thyroid  function studies: No results for input(s): TSH, T4TOTAL, T3FREE, THYROIDAB in the last 72 hours.  Invalid input(s): FREET3 Anemia work up: No results for input(s): VITAMINB12, FOLATE, FERRITIN, TIBC, IRON, RETICCTPCT in the last 72 hours. Sepsis Labs: Recent Labs  Lab 11/05/23 0749 11/05/23 0824 11/07/23 0339 11/08/23 0540  PROCALCITON  --  <0.10  --   --   WBC 8.2  --  8.4 9.8    Microbiology Recent Results (from the past 240 hours)  COVID-19, Flu A+B and RSV     Status: None   Collection Time: 11/01/23  4:22 PM  Result Value Ref Range Status   SARS-CoV-2, NAA Not Detected Not Detected Final   Influenza A, NAA Not Detected Not Detected Final   Influenza B, NAA Not Detected Not Detected Final   RSV, NAA Not Detected Not Detected Final   Test Information: Comment  Final    Comment: This nucleic acid amplification test was developed and its  performance characteristics determined by [employer]. Nucleic acid amplification tests include RT-PCR and TMA. This test has not been FDA cleared or approved. This test has been authorized by FDA under an Emergency Use Authorization (EUA). This test is only authorized for the duration of time the declaration that circumstances exist justifying the authorization of the emergency use of in vitro diagnostic tests for detection of SARS-CoV-2 virus and/or diagnosis of COVID-19 infection under section 564(b)(1) of the Act, 21 U.S.C. 639aaa-6(a) (1), unless the authorization is terminated or revoked sooner. When diagnostic testing is negative, the possibility of a false negative result should be considered in the context of a patient's recent exposures and the presence of clinical signs and symptoms consistent with COVID-19. An individual without symptoms of COVID-19 and who is not shedding SARS-CoV-2 virus wo uld expect to have a negative (not detected) result in this assay.   Resp panel by RT-PCR (RSV, Flu A&B, Covid) Anterior Nasal Swab     Status: None   Collection Time: 11/05/23  8:24 AM   Specimen: Anterior Nasal Swab  Result Value Ref Range Status   SARS Coronavirus 2 by RT PCR NEGATIVE NEGATIVE Final    Comment: (NOTE) SARS-CoV-2 target nucleic acids are NOT DETECTED.  The SARS-CoV-2 RNA is generally detectable in upper respiratory specimens during the acute phase of infection. The lowest concentration of SARS-CoV-2 viral copies this assay can detect is 138 copies/mL. A negative result does not preclude SARS-Cov-2 infection and should not be used as the sole basis for treatment or other patient management decisions. A negative result Hata occur with  improper specimen collection/handling, submission of specimen other than nasopharyngeal swab, presence of viral mutation(s) within the areas targeted by this assay, and inadequate number of viral  copies(<138 copies/mL). A  negative result must be combined with clinical observations, patient history, and epidemiological information. The expected result is Negative.  Fact Sheet for Patients:  BloggerCourse.com  Fact Sheet for Healthcare Providers:  SeriousBroker.it  This test is no t yet approved or cleared by the [place]  FDA and  has been authorized for detection and/or diagnosis of SARS-CoV-2 by FDA under an Emergency Use Authorization (EUA). This EUA will remain  in effect (meaning this test can be used) for the duration of the COVID-19 declaration under Section 564(b)(1) of the Act, 21 U.S.C.section 360bbb-3(b)(1), unless the authorization is terminated  or revoked sooner.       Influenza A by PCR NEGATIVE NEGATIVE Final   Influenza B by PCR NEGATIVE NEGATIVE Final    Comment: (NOTE) The Xpert Xpress SARS-CoV-2/FLU/RSV plus assay is intended as an aid in the diagnosis of influenza from Nasopharyngeal swab specimens and should not be used as a sole basis for treatment. Nasal washings and aspirates are unacceptable for Xpert Xpress SARS-CoV-2/FLU/RSV testing.  Fact Sheet for Patients: BloggerCourse.com  Fact Sheet for Healthcare Providers: SeriousBroker.it  This test is not yet approved or cleared by the [place]  FDA and has been authorized for detection and/or diagnosis of SARS-CoV-2 by FDA under an Emergency Use Authorization (EUA). This EUA will remain in effect (meaning this test can be used) for the duration of the COVID-19 declaration under Section 564(b)(1) of the Act, 21 U.S.C. section 360bbb-3(b)(1), unless the authorization is terminated or revoked.     Resp Syncytial Virus by PCR NEGATIVE NEGATIVE Final    Comment: (NOTE) Fact Sheet for Patients: BloggerCourse.com  Fact Sheet for Healthcare  Providers: SeriousBroker.it  This test is not yet approved or cleared by the [place]  FDA and has been authorized for detection and/or diagnosis of SARS-CoV-2 by FDA under an Emergency Use Authorization (EUA). This EUA will remain in effect (meaning this test can be used) for the duration of the COVID-19 declaration under Section 564(b)(1) of the Act, 21 U.S.C. section 360bbb-3(b)(1), unless the authorization is terminated or revoked.  Performed at [facility], [address] [place], [place] [address]   Respiratory (~20 pathogens) panel by PCR     Status: None   Collection Time: 11/05/23 10:48 AM   Specimen: Nasopharyngeal Swab; Respiratory  Result Value Ref Range Status   Adenovirus NOT DETECTED NOT DETECTED Final   Coronavirus 229E NOT DETECTED NOT DETECTED Final    Comment: (NOTE) The Coronavirus on the Respiratory Panel, DOES NOT test for the novel  Coronavirus (2019 nCoV)    Coronavirus HKU1 NOT DETECTED NOT DETECTED Final   Coronavirus NL63 NOT DETECTED NOT DETECTED Final   Coronavirus OC43 NOT DETECTED NOT DETECTED Final   Metapneumovirus NOT DETECTED NOT DETECTED Final   Rhinovirus / Enterovirus NOT DETECTED NOT DETECTED Final   Influenza A NOT DETECTED NOT DETECTED Final   Influenza B NOT DETECTED NOT DETECTED Final   Parainfluenza Virus 1 NOT DETECTED NOT DETECTED Final   Parainfluenza Virus 2 NOT DETECTED NOT DETECTED Final   Parainfluenza Virus 3 NOT DETECTED NOT DETECTED Final   Parainfluenza Virus 4 NOT DETECTED NOT DETECTED Final   Respiratory Syncytial Virus NOT DETECTED NOT DETECTED Final   Bordetella pertussis NOT DETECTED NOT DETECTED Final   Bordetella Parapertussis NOT DETECTED NOT DETECTED Final   Chlamydophila pneumoniae NOT DETECTED NOT DETECTED Final   Mycoplasma pneumoniae NOT DETECTED NOT DETECTED Final    Comment: Performed at [facility], [address], [place], [place] [address]  Expectorated  Sputum Assessment w Gram Stain, Rflx to Resp Cult     Status: None   Collection Time: 11/05/23 11:03 AM   Specimen: Expectorated Sputum  Result Value Ref Range Status   Specimen Description EXPECTORATED SPUTUM  Final   Special Requests NONE  Final   Sputum evaluation   Final    THIS SPECIMEN IS ACCEPTABLE FOR SPUTUM CULTURE Performed at [facility], [address], [place], [place] [address]    Report Status 11/05/2023 FINAL  Final  Culture, Respiratory w Gram Stain     Status: None   Collection Time: 11/05/23 11:03 AM  Result Value Ref Range Status   Specimen Description   Final    EXPECTORATED SPUTUM Performed at [facility], [address], [place], [place] [address]    Special Requests   Final    NONE Reflexed from [phone] Performed at [facility], [address] [place], [place] [address]    Gram Stain   Final    RARE WBC SEEN MODERATE GRAM NEGATIVE RODS MODERATE GRAM POSITIVE COCCI    Culture   Final    MODERATE HAEMOPHILUS INFLUENZAE BETA LACTAMASE NEGATIVE Performed at [facility], [address], [place], [place] [address]    Report Status 11/07/2023 FINAL  Final    Procedures and diagnostic studies:  No results found.             LOS: 2 days   Taegen Lennox  [facility]   Pager on [email or web address]. If 7PM-7AM, please contact night-coverage at [email or web address]     11/08/2023, 2:39 PM

## 2023-11-09 DIAGNOSIS — J9601 Acute respiratory failure with hypoxia: Secondary | ICD-10-CM | POA: Diagnosis present

## 2023-11-09 DIAGNOSIS — J45901 Unspecified asthma with (acute) exacerbation: Secondary | ICD-10-CM

## 2023-11-09 MED ORDER — AMOXICILLIN-POT CLAVULANATE 875-125 MG PO TABS: 1.0000 | ORAL_TABLET | Freq: Two times a day (BID) | ORAL | 0 refills | Status: AC

## 2023-11-09 MED ORDER — FLUTICASONE PROPIONATE 50 MCG/ACT NA SUSP: 1.0000 | Freq: Every day | NASAL | Status: DC

## 2023-11-09 MED ORDER — GUAIFENESIN ER 600 MG PO TB12: 1200.0000 mg | ORAL_TABLET | Freq: Two times a day (BID) | ORAL | Status: AC

## 2023-11-09 NOTE — Progress Notes (Signed)
 Patient discharging home. All belongings sent with patient. Discharge education and teaching provided, all questions answered. IV removed.

## 2023-11-09 NOTE — Progress Notes (Signed)
 Occupational Therapy Treatment Patient Details Name: Patricia Walker MRN: [id number] DOB: 05-Feb-1946 Today's Date: 11/09/2023   History of present illness Pt is a 78 y.o. female presented with worsening of cough, wheezing, shortness of breath. MD assessment includes Acute hypoxic respiratory failure and acute COPD exacerbation. PMH significant for asthma, DOE, CAD, depression, HTN, MI, PAF, morbid obesity, & AFIB   OT comments  Pt is seated on toilet on arrival. Pleasant and agreeable to OT session. She reports she has a stomach ache and has used the bathroom multiple times this morning. Notified nurse of this and pt request for meds for stomach pain. Pt performed all aspects of toileting with supervision, as well as standing grooming and dressing tasks in bathroom with supervision and intermittent seated rest breaks d/t fatigue. Continued education provided on importance of pacing and ECS to be implemented into ADL performance. She ambulated within the room without AD with no LOB this date. Max A needed for LB dressing to change her socks d/t inability to bend from stomach pain. Pt left with all needs in place and will cont to require skilled acute OT services to maximize her safety and IND to return to PLOF.       If plan is discharge home, recommend the following:  A little help with walking and/or transfers;A little help with bathing/dressing/bathroom   Equipment Recommendations  None recommended by OT    Recommendations for Other Services      Precautions / Restrictions Precautions Precautions: Fall Recall of Precautions/Restrictions: Intact Restrictions Weight Bearing Restrictions Per Provider Order: No       Mobility Bed Mobility               General bed mobility comments: seated on toilet on arrival and left in recliner    Transfers Overall transfer level: Modified independent Equipment used: None               General transfer comment: no AD used this date  as pt found in bathroom without it and ambulating in room without AD with supervision/IND     Balance Overall balance assessment: Needs assistance Sitting-balance support: Feet supported Sitting balance-Leahy Scale: Normal     Standing balance support: During functional activity Standing balance-Leahy Scale: Good Standing balance comment: no AD and no LOB this date                           ADL either performed or assessed with clinical judgement   ADL Overall ADL's : Needs assistance/impaired     Grooming: Wash/dry hands;Wash/dry face;Oral care;Supervision/safety;Standing Grooming Details (indicate cue type and reason): bathroom sink         Upper Body Dressing : Supervision/safety;Standing Upper Body Dressing Details (indicate cue type and reason): bathroom sink Lower Body Dressing: Maximal assistance;Sitting/lateral leans Lower Body Dressing Details (indicate cue type and reason): to change socks seated in recliner as pt reports she cannot bend d/t her stomach ache Toilet Transfer: Modified Independent;Regular Teacher, adult education Details (indicate cue type and reason): found in bathroom on toilet on entry without AD use and able to use bathroom additional time during visit without difficulty; ambulated back to recliner with no AD and steady gait         Functional mobility during ADLs: Independent      Extremity/Trunk Assessment              Vision       Perception  Praxis     Communication Communication Communication: No apparent difficulties   Cognition Arousal: Alert Behavior During Therapy: WFL for tasks assessed/performed                                 Following commands: Intact        Cueing      Exercises Other Exercises Other Exercises: Continued education on pacing and ECS to utilize during ADL performance.    Shoulder Instructions       General Comments continues to endorse weakness and fatigue  during coughing spells    Pertinent Vitals/ Pain       Pain Assessment Pain Assessment: Faces Faces Pain Scale: Hurts even more Pain Location: stomach ache Pain Descriptors / Indicators: Aching Pain Intervention(s): Monitored during session, Patient requesting pain meds-RN notified  Home Living                                          Prior Functioning/Environment              Frequency  Min 1X/week        Progress Toward Goals  OT Goals(current goals can now be found in the care plan section)  Progress towards OT goals: Progressing toward goals  Acute Rehab OT Goals Patient Stated Goal: feel stronger OT Goal Formulation: With patient Time For Goal Achievement: 11/21/23 Potential to Achieve Goals: Good  Plan      Co-evaluation                 AM-PAC OT 6 Clicks Daily Activity     Outcome Measure   Help from another person eating meals?: None Help from another person taking care of personal grooming?: None Help from another person toileting, which includes using toliet, bedpan, or urinal?: None Help from another person bathing (including washing, rinsing, drying)?: A Little Help from another person to put on and taking off regular upper body clothing?: None Help from another person to put on and taking off regular lower body clothing?: A Little 6 Click Score: 22    End of Session    OT Visit Diagnosis: Other abnormalities of gait and mobility (R26.89)   Activity Tolerance Patient tolerated treatment well   Patient Left in chair;with call bell/phone within reach   Nurse Communication Mobility status        Time: 9142-9081 OT Time Calculation (min): 21 min  Charges: OT General Charges $OT Visit: 1 Visit OT Treatments $Self Care/Home Management : 8-22 mins  Raylynne Cubbage, OTR/L  11/09/23, 11:06 AM   Aengus Sauceda E Tracy Gerken 11/09/2023, 11:04 AM

## 2023-11-09 NOTE — Plan of Care (Signed)

## 2023-11-09 NOTE — Discharge Summary (Signed)
 Physician Discharge Summary   Patient: Patricia Walker MRN: [id number] DOB: 17-Jun-1945  Admit date:     11/05/2023  Discharge date: 11/09/23  Discharge Physician: AIDA CHO   PCP: [name], FNP   Recommendations at discharge:   Follow-up with PCP in 1 week  Discharge Diagnoses: Principal Problem:   Exacerbation of RAD (reactive airway disease) Active Problems:   Acute respiratory failure with hypoxia (HCC)   Atrial fibrillation (HCC)   Obesity, morbid (HCC)  Resolved Problems:   * No resolved hospital problems. *  Hospital Course:   Patricia Walker is a 78 y.o. female  with medical history significant of asthma, CAD, PAF on Eliquis , HTN, HLD, hypothyroidism, OSA on CPAP at bedtime, morbid obesity, who presented to the hospital with cough, wheezing and shortness of breath.  Symptoms started about 5 days prior to admission.  She initially developed a runny nose and sore throat and this was followed by productive cough, wheezing and shortness of breath which progressively got worse.  She had used her albuterol  around-the-clock without any improvement.   In the ED, vital signs were significant for oxygen saturation of 86% on room air.  Chest x-ray did not show any acute infiltrates.   Assessment and Plan:   Acute hypoxic respiratory failure: Resolved. Pulse oximetry with ambulation on room air was normal       Periodic bronchospasm, reactive airway disease from smoking in the past: Completed 5-day course of steroids.  She was initially treated with azithromycin . Sputum culture showed haemophilus influenzae.  She was started on Augmentin  and she will continue Augmentin  for 4 more days to complete 5 days of treatment.   Chart review showed that she saw Dr. Theotis, pulmonologist on 10/20/2023. At best stage 0 COPD per Dr. Aletha (pulmonologist) note from 10/20/2023 based on PFT results.  However, patient has history of significant tobacco use disorder in the past  though she quit about 10 years ago.   SPIROMETRY: FVC was 2.08 L, 77 % of predicted FEV1 was 1.75 L, 85 % of predicted FEV1/FVC ratio was 109 % of predicted FEF 25-75% liters per second was 127 % of predicted  LUNG VOLUMES: TLC was 80 % of predicted RV was 90 % of predicted  DIFFUSION CAPACITY: DLCO was 77 % of predicted DLCO/VA was 104 % of predicted  Good patient effort with good repeatability.  Interpretation: Fvc is slightly decreased, otherqwise normal Lung volumes are in normal range Dlco is slightly decreased, dlco/va is in normal range          Chronic diastolic CHF, hypertension, CAD: Compensated.  Continue Lasix  and enalapril      Paroxysmal atrial fibrillation: Continue Eliquis  and metoprolol .  She confirmed that she is supposed to take metoprolol  but has not been taking it like she should     OSA on CPAP: Recommended that she use CPAP at night.    History of esophageal stricture s/p dilatation about 10 years ago: Recommended outpatient follow-up with gastroenterologist for further evaluation.   Her condition has improved and she is deemed stable for discharge to home today.       Consultants: None Procedures performed: None Disposition: Home Diet recommendation:  Discharge Diet Orders (From admission, onward)     Start     Ordered   11/09/23 0000  Diet - low sodium heart healthy        11/09/23 1426           Cardiac diet DISCHARGE MEDICATION: Allergies  as of 11/09/2023       Reactions   Codeine Hives, Itching, Rash   Other Itching, Other (See Comments)   States antibiotic in the past caused itching but can not remember name        Medication List     STOP taking these medications    baclofen  10 MG tablet Commonly known as: LIORESAL    Breztri  Aerosphere 160-9-4.8 MCG/ACT Aero inhaler Generic drug: budesonide -glycopyrrolate -formoterol    chlorhexidine  0.12 % solution Commonly known as: PERIDEX    montelukast  10 MG  tablet Commonly known as: SINGULAIR    ofloxacin 0.3 % OTIC solution Commonly known as: FLOXIN   triamcinolone  cream 0.1 % Commonly known as: KENALOG    Zepbound  2.5 MG/0.5ML Pen Generic drug: tirzepatide        TAKE these medications    acetaminophen  325 MG tablet Commonly known as: TYLENOL  Take 2 tablets (650 mg total) by mouth every 6 (six) hours as needed for mild pain (pain score 1-3) or moderate pain (pain score 4-6).   albuterol  108 (90 Base) MCG/ACT inhaler Commonly known as: VENTOLIN  HFA INHALE 1-2 PUFFS EVERY 4-6 HOURS AS NEEDED FOR SHORTNESS OF BREATH   amoxicillin -clavulanate 875-125 MG tablet Commonly known as: AUGMENTIN  Take 1 tablet by mouth every 12 (twelve) hours for 4 days.   atorvastatin  40 MG tablet Commonly known as: LIPITOR ONCE A DAY FOR CHOLESTEROL   chlorpheniramine-HYDROcodone  10-8 MG/5ML Commonly known as: TUSSIONEX Take 5 mLs by mouth every 12 (twelve) hours as needed for cough.   Eliquis  5 MG Tabs tablet Generic drug: apixaban  TAKE 1 TABLET BY MOUTH TWICE A DAY   enalapril  2.5 MG tablet Commonly known as: VASOTEC  TAKE 1 TABLET BY MOUTH EVERY DAY   esomeprazole 40 MG capsule Commonly known as: NEXIUM TAKE 1 CAPSULE BY MOUTH EVERY DAY IN THE MORNING   fluticasone  50 MCG/ACT nasal spray Commonly known as: FLONASE  Place 1 spray into both nostrils daily. Start taking on: November 10, 2023   furosemide  40 MG tablet Commonly known as: LASIX  Take 0.5 tablets (20 mg total) by mouth daily.   gabapentin  300 MG capsule Commonly known as: NEURONTIN  Take 300 mg by mouth daily.   Gemtesa  75 MG Tabs Generic drug: Vibegron  Take 1 tablet (75 mg total) by mouth daily.   guaiFENesin  600 MG 12 hr tablet Commonly known as: MUCINEX  Take 2 tablets (1,200 mg total) by mouth 2 (two) times daily for 5 days.   ipratropium-albuterol  0.5-2.5 (3) MG/3ML Soln Commonly known as: DUONEB Take 3 mLs by nebulization every 6 (six) hours as needed.    meloxicam 15 MG tablet Commonly known as: MOBIC Take 15 mg by mouth daily.   metoprolol  succinate 25 MG 24 hr tablet Commonly known as: TOPROL -XL TAKE 1 TABLET (25 MG TOTAL) BY MOUTH DAILY.   nystatin  powder Commonly known as: nystatin  Apply 1 Application topically 3 (three) times daily as needed. Irritation under breast and groin area   Taltz 80 MG/ML pen Generic drug: ixekizumab Inject 80 mg into the skin every 28 (twenty-eight) days.   Trelegy Ellipta 100-62.5-25 MCG/ACT Aepb Generic drug: Fluticasone -Umeclidin-Vilant Inhale 1 puff into the lungs daily.        Discharge Exam: Filed Weights   11/05/23 0745  Weight: 136.1 kg   GEN: NAD SKIN: Warm and dry EYES: No pallor or icterus ENT: MMM CV: RRR PULM: No wheezing or rales heard ABD: soft, obese, NT, +BS CNS: AAO x 3, non focal EXT: No edema or tenderness   Condition at discharge:  good  The results of significant diagnostics from this hospitalization (including imaging, microbiology, ancillary and laboratory) are listed below for reference.   Imaging Studies: US  Venous Img Lower Bilateral Result Date: 11/05/2023 CLINICAL DATA:  Right lower extremity pain. EXAM: Bilateral LOWER EXTREMITY VENOUS DOPPLER ULTRASOUND TECHNIQUE: Gray-scale sonography with compression, as well as color and duplex ultrasound, were performed to evaluate the deep venous system(s) from the level of the common femoral vein through the popliteal and proximal calf veins. COMPARISON:  Ultrasound dated 09/08/2020. FINDINGS: VENOUS Normal compressibility of the common femoral, superficial femoral, and popliteal veins, as well as the visualized calf veins. Visualized portions of profunda femoral vein and great saphenous vein unremarkable. No filling defects to suggest DVT on grayscale or color Doppler imaging. Doppler waveforms show normal direction of venous flow, normal respiratory plasticity and response to augmentation. OTHER None. Limitations:  none IMPRESSION: Negative. Electronically Signed   By: Vanetta Chou M.D.   On: 11/05/2023 09:54   DG Chest 2 View Result Date: 11/05/2023 CLINICAL DATA:  Shortness of breath. EXAM: CHEST - 2 VIEW COMPARISON:  08/05/2023 FINDINGS: The cardio pericardial silhouette is enlarged. The lungs are clear without focal pneumonia, edema, pneumothorax or pleural effusion. No acute bony abnormality. Telemetry leads overlie the chest. IMPRESSION: Enlargement of the cardiopericardial silhouette without acute cardiopulmonary findings. Electronically Signed   By: Camellia Candle M.D.   On: 11/05/2023 09:23    Microbiology: Results for orders placed or performed during the hospital encounter of 11/05/23  Resp panel by RT-PCR (RSV, Flu A&B, Covid) Anterior Nasal Swab     Status: None   Collection Time: 11/05/23  8:24 AM   Specimen: Anterior Nasal Swab  Result Value Ref Range Status   SARS Coronavirus 2 by RT PCR NEGATIVE NEGATIVE Final    Comment: (NOTE) SARS-CoV-2 target nucleic acids are NOT DETECTED.  The SARS-CoV-2 RNA is generally detectable in upper respiratory specimens during the acute phase of infection. The lowest concentration of SARS-CoV-2 viral copies this assay can detect is 138 copies/mL. A negative result does not preclude SARS-Cov-2 infection and should not be used as the sole basis for treatment or other patient management decisions. A negative result [place] occur with  improper specimen collection/handling, submission of specimen other than nasopharyngeal swab, presence of viral mutation(s) within the areas targeted by this assay, and inadequate number of viral copies(<138 copies/mL). A negative result must be combined with clinical observations, patient history, and epidemiological information. The expected result is Negative.  Fact Sheet for Patients:  BloggerCourse.com  Fact Sheet for Healthcare Providers:  SeriousBroker.it  This  test is no t yet approved or cleared by the [place]  FDA and  has been authorized for detection and/or diagnosis of SARS-CoV-2 by FDA under an Emergency Use Authorization (EUA). This EUA will remain  in effect (meaning this test can be used) for the duration of the COVID-19 declaration under Section 564(b)(1) of the Act, 21 U.S.C.section 360bbb-3(b)(1), unless the authorization is terminated  or revoked sooner.       Influenza A by PCR NEGATIVE NEGATIVE Final   Influenza B by PCR NEGATIVE NEGATIVE Final    Comment: (NOTE) The Xpert Xpress SARS-CoV-2/FLU/RSV plus assay is intended as an aid in the diagnosis of influenza from Nasopharyngeal swab specimens and should not be used as a sole basis for treatment. Nasal washings and aspirates are unacceptable for Xpert Xpress SARS-CoV-2/FLU/RSV testing.  Fact Sheet for Patients: BloggerCourse.com  Fact Sheet for Healthcare Providers: SeriousBroker.it  This test  is not yet approved or cleared by the [place]  FDA and has been authorized for detection and/or diagnosis of SARS-CoV-2 by FDA under an Emergency Use Authorization (EUA). This EUA will remain in effect (meaning this test can be used) for the duration of the COVID-19 declaration under Section 564(b)(1) of the Act, 21 U.S.C. section 360bbb-3(b)(1), unless the authorization is terminated or revoked.     Resp Syncytial Virus by PCR NEGATIVE NEGATIVE Final    Comment: (NOTE) Fact Sheet for Patients: BloggerCourse.com  Fact Sheet for Healthcare Providers: SeriousBroker.it  This test is not yet approved or cleared by the [place]  FDA and has been authorized for detection and/or diagnosis of SARS-CoV-2 by FDA under an Emergency Use Authorization (EUA). This EUA will remain in effect (meaning this test can be used) for the duration of the COVID-19 declaration under  Section 564(b)(1) of the Act, 21 U.S.C. section 360bbb-3(b)(1), unless the authorization is terminated or revoked.  Performed at [facility], [address] [place], [place] [address]   Respiratory (~20 pathogens) panel by PCR     Status: None   Collection Time: 11/05/23 10:48 AM   Specimen: Nasopharyngeal Swab; Respiratory  Result Value Ref Range Status   Adenovirus NOT DETECTED NOT DETECTED Final   Coronavirus 229E NOT DETECTED NOT DETECTED Final    Comment: (NOTE) The Coronavirus on the Respiratory Panel, DOES NOT test for the novel  Coronavirus (2019 nCoV)    Coronavirus HKU1 NOT DETECTED NOT DETECTED Final   Coronavirus NL63 NOT DETECTED NOT DETECTED Final   Coronavirus OC43 NOT DETECTED NOT DETECTED Final   Metapneumovirus NOT DETECTED NOT DETECTED Final   Rhinovirus / Enterovirus NOT DETECTED NOT DETECTED Final   Influenza A NOT DETECTED NOT DETECTED Final   Influenza B NOT DETECTED NOT DETECTED Final   Parainfluenza Virus 1 NOT DETECTED NOT DETECTED Final   Parainfluenza Virus 2 NOT DETECTED NOT DETECTED Final   Parainfluenza Virus 3 NOT DETECTED NOT DETECTED Final   Parainfluenza Virus 4 NOT DETECTED NOT DETECTED Final   Respiratory Syncytial Virus NOT DETECTED NOT DETECTED Final   Bordetella pertussis NOT DETECTED NOT DETECTED Final   Bordetella Parapertussis NOT DETECTED NOT DETECTED Final   Chlamydophila pneumoniae NOT DETECTED NOT DETECTED Final   Mycoplasma pneumoniae NOT DETECTED NOT DETECTED Final    Comment: Performed at [facility], [address] [place], [place] [address]  Expectorated Sputum Assessment w Gram Stain, Rflx to Resp Cult     Status: None   Collection Time: 11/05/23 11:03 AM   Specimen: Expectorated Sputum  Result Value Ref Range Status   Specimen Description EXPECTORATED SPUTUM  Final   Special Requests NONE  Final   Sputum evaluation   Final    THIS SPECIMEN IS ACCEPTABLE FOR SPUTUM CULTURE Performed at [facility], [address], [place], [place] [address]    Report Status 11/05/2023 FINAL  Final  Culture, Respiratory w Gram Stain     Status: None   Collection Time: 11/05/23 11:03 AM  Result Value Ref Range Status   Specimen Description   Final    EXPECTORATED SPUTUM Performed at [facility], [address] [place], [place] [address]    Special Requests   Final    NONE Reflexed from [id number] Performed at [facility], [address] [place], [place] [address]    Gram Stain   Final    RARE WBC SEEN MODERATE GRAM NEGATIVE RODS MODERATE GRAM POSITIVE  COCCI    Culture   Final    MODERATE HAEMOPHILUS INFLUENZAE BETA LACTAMASE NEGATIVE Performed at [facility], [address], [place], [place] [address]    Report Status 11/07/2023 FINAL  Final    Labs: CBC: Recent Labs  Lab 11/05/23 0749 11/07/23 0339 11/08/23 0540  WBC 8.2 8.4 9.8  HGB 14.0 13.5 13.7  HCT 43.3 42.6 42.9  MCV 85.4 87.1 86.5  PLT 312 324 375   Basic Metabolic Panel: Recent Labs  Lab 11/05/23 0749 11/05/23 0824 11/07/23 0339 11/08/23 0540  NA 136  --  139 134*  K 3.8  --  5.0 4.5  CL 101  --  105 98  CO2 25  --  27 25  GLUCOSE 133*  --  173* 217*  BUN 10  --  31* 30*  CREATININE 0.96  --  1.05* 1.17*  CALCIUM  8.7*  --  8.5* 8.8*  MG  --  1.9  --   --    Liver Function Tests: No results for input(s): AST, ALT, ALKPHOS, BILITOT, PROT, ALBUMIN in the last 168 hours. CBG: No results for input(s): GLUCAP in the last 168 hours.  Discharge time spent: greater than 30 minutes.  Signed: AIDA CHO, MD [facility] 11/09/2023

## 2023-11-09 NOTE — Care Management Important Message (Signed)
 Important Message  Patient Details  Name: Patricia Walker MRN: [id number] Date of Birth: 05-Mar-1946   Important Message Given:  Yes - Medicare IM     Malakye Nolden W, CMA 11/09/2023, 12:06 PM

## 2023-11-09 NOTE — Progress Notes (Signed)
 Mobility Specialist - Progress Note  Post-mobility: HR 74, SPO2 96%   11/09/23 1145  Mobility  Activity Ambulated independently  Level of Assistance Modified independent, requires aide device or extra time  Assistive Device Cane [facility])  Distance Ambulated (ft) 160 ft  Activity Response Tolerated well  Mobility visit 1 Mobility  Mobility Specialist Start Time (ACUTE ONLY) 1131  Mobility Specialist Stop Time (ACUTE ONLY) 1140  Mobility Specialist Time Calculation (min) (ACUTE ONLY) 9 min   Pt sitting in the recliner upon entry, utilizing RA. Pt expressed feeling better than earlier--- previous chest pain. Pt STS to cane and amb one lap around the NS ModI, utilizing railing with increased fatigue. ~120 ft into amb Pt expressed B hip pain, requiring a brief standing rest break. Pt returned to the room, left seated in the recliner with needs within reach. RN notified.    America Silvan Mobility Specialist 11/09/23 11:56 AM

## 2023-11-10 ENCOUNTER — Telehealth: Payer: Self-pay

## 2023-11-10 DIAGNOSIS — J45909 Unspecified asthma, uncomplicated: Secondary | ICD-10-CM | POA: Diagnosis not present

## 2023-11-10 NOTE — Transitions of Care (Post Inpatient/ED Visit) (Signed)
 11/10/2023  Name: Patricia Walker MRN: [id number] DOB: June 27, 1945  Today's TOC FU Call Status: Today's TOC FU Call Status:: Successful TOC FU Call Completed TOC FU Call Complete Date: 11/10/23 Patient's Name and Date of Birth confirmed.  Transition Care Management Follow-up Telephone Call Date of Discharge: 11/09/23 Discharge Facility: [facility]) Type of Discharge: Inpatient Admission Primary Inpatient Discharge Diagnosis:: Multiple Viral Infections How have you been since you were released from the hospital?: Better Any questions or concerns?: No  Items Reviewed: Did you receive and understand the discharge instructions provided?: Yes Medications obtained,verified, and reconciled?: Yes (Medications Reviewed) Any new allergies since your discharge?: No Dietary orders reviewed?: Yes Type of Diet Ordered:: Low Sodium Do you have support at home?: Yes People in Home [RPT]: grandchild(ren) Name of Support/Comfort Primary Source: Jacob Wixon  Medications Reviewed Today: Medications Reviewed Today     Reviewed by Moises Reusing, RN (Case Manager) on 11/10/23 at [phone]  Med List Status: <None>   Medication Order Taking? Sig Documenting Provider Last Dose Status Informant  acetaminophen  (TYLENOL ) 325 MG tablet [id number] Yes Take 2 tablets (650 mg total) by mouth every 6 (six) hours as needed for mild pain (pain score 1-3) or moderate pain (pain score 4-6). Jens Durand, MD  Active Self  albuterol  (VENTOLIN  HFA) 108 [phone] Base) MCG/ACT inhaler [id number] Yes INHALE 1-2 PUFFS EVERY 4-6 HOURS AS NEEDED FOR SHORTNESS OF BREATH [name], FNP  Active Self  amoxicillin -clavulanate (AUGMENTIN ) 875-125 MG tablet [id number] Yes Take 1 tablet by mouth every 12 (twelve) hours for 4 days. Jens Durand, MD  Active   atorvastatin  (LIPITOR) 40 MG tablet [id number] Yes ONCE A DAY FOR CHOLESTEROL [name], FNP  Active Self           Med Note CATHY OVAL VEAR Austin Nov 05, 2023  5:08 PM)    chlorpheniramine-HYDROcodone  (TUSSIONEX) 10-8 MG/5ML [id number] Yes Take 5 mLs by mouth every 12 (twelve) hours as needed for cough. [name], FNP  Active Self  ELIQUIS  5 MG TABS tablet [id number] Yes TAKE 1 TABLET BY MOUTH TWICE A DAY [name], FNP  Active Self  enalapril  (VASOTEC ) 2.5 MG tablet [id number] Yes TAKE 1 TABLET BY MOUTH EVERY DAY [name], FNP  Active Self  esomeprazole (NEXIUM) 40 MG capsule [id number] Yes TAKE 1 CAPSULE BY MOUTH EVERY DAY IN THE MORNING [name], FNP  Active Self  fluticasone  (FLONASE ) 50 MCG/ACT nasal spray [id number] Yes Place 1 spray into both nostrils daily. Jens Durand, MD  Active   furosemide  (LASIX ) 40 MG tablet [id number] Yes Take 0.5 tablets (20 mg total) by mouth daily. Jens Durand, MD  Active Self  gabapentin  (NEURONTIN ) 300 MG capsule [id number] Yes Take 300 mg by mouth daily. [provider]  Active Self  guaiFENesin  (MUCINEX ) 600 MG 12 hr tablet [id number] Yes Take 2 tablets (1,200 mg total) by mouth 2 (two) times daily for 5 days. Jens Durand, MD  Active   ipratropium-albuterol  (DUONEB) 0.5-2.5 (3) MG/3ML SOLN [id number] Yes Take 3 mLs by nebulization every 6 (six) hours as needed. [name], FNP  Active Self  meloxicam [facility]) 15 MG tablet [id number] Yes Take 15 mg by mouth daily. [provider]  Active Self  metoprolol  succinate (TOPROL -XL) 25 MG 24 hr tablet [id number] Yes TAKE 1 TABLET (25 MG TOTAL) BY MOUTH DAILY. [name], FNP  Active Self  nystatin  powder [id number] Yes Apply 1 Application  topically 3 (three) times daily as needed. Irritation under breast and groin area [name], FNP  Active Self  TALTZ 80 MG/ML EMMANUEL [id number] Yes Inject 80 mg into the skin every 28 (twenty-eight) days. [provider]  Active Self  TRELEGY ELLIPTA 100-62.5-25 MCG/ACT AEPB [id number] Yes Inhale 1 puff into the lungs daily. [provider]   Active Self  Vibegron  (GEMTESA ) 75 MG TABS [id number] Yes Take 1 tablet (75 mg total) by mouth daily. [name], FNP  Active Self            Home Care and Equipment/Supplies: Were Home Health Services Ordered?: NA Any new equipment or medical supplies ordered?: NA  Functional Questionnaire: Do you need assistance with bathing/showering or dressing?: No Do you need assistance with meal preparation?: No Do you need assistance with eating?: No Do you have difficulty maintaining continence: Yes (Wears depends on occasion) Do you need assistance with getting out of bed/getting out of a chair/moving?: No Do you have difficulty managing or taking your medications?: No  Follow up appointments reviewed: PCP Follow-up appointment confirmed?: Yes Date of PCP follow-up appointment?: 11/14/23 Follow-up Provider: Alan Orlean Specialist [facility] Follow-up appointment confirmed?: NA Do you need transportation to your follow-up appointment?: No Do you understand care options if your condition(s) worsen?: Yes-patient verbalized understanding  SDOH Interventions Today    Flowsheet Row Most Recent Value  SDOH Interventions   Food Insecurity Interventions Intervention Not Indicated  Housing Interventions Intervention Not Indicated  Transportation Interventions Intervention Not Indicated  Utilities Interventions Intervention Not Indicated    Goals Addressed             This Visit's Progress    VBCI Transitions of Care (TOC) Care Plan       Problems:  Recent Hospitalization for treatment of COPD  Goal:  Over the next 30 days, the patient will not experience hospital readmission  Interventions:   COPD Interventions: Advised patient to track and manage COPD triggers Assessed social determinant of health barriers Discussed the importance of adequate rest and management of fatigue with COPD Provided education about and advised patient to utilize infection prevention  strategies to reduce risk of respiratory infection Provided instruction about proper use of medications used for management of COPD including inhalers Screening for signs and symptoms of depression related to chronic disease state  Use CPAP at night   Patient Self Care Activities:  Attend all scheduled provider appointments Call pharmacy for medication refills 3-7 days in advance of running out of medications Call provider office for new concerns or questions  Notify RN Care Manager of [facility] call rescheduling needs Participate in Transition of Care Program/Attend [facility] scheduled calls Perform all self care activities independently  Take medications as prescribed    Plan:  Telephone follow up appointment with care management team member scheduled for:  Friday August 8th at 10:00am        Medford Balboa, BSN, RN [place]  VBCI - [facility] RN Care Manager [phone]

## 2023-11-10 NOTE — Patient Instructions (Signed)
 Visit Information  Thank you for taking time to visit with me today. Please don't hesitate to contact me if I can be of assistance to you before our next scheduled telephone appointment.  Our next appointment is by telephone on Friday August 8th at 10am  Following is a copy of your care plan:   Goals Addressed             This Visit's Progress    VBCI Transitions of Care (TOC) Care Plan       Problems:  Recent Hospitalization for treatment of COPD  Goal:  Over the next 30 days, the patient will not experience hospital readmission  Interventions:   COPD Interventions: Advised patient to track and manage COPD triggers Assessed social determinant of health barriers Discussed the importance of adequate rest and management of fatigue with COPD Provided education about and advised patient to utilize infection prevention strategies to reduce risk of respiratory infection Provided instruction about proper use of medications used for management of COPD including inhalers Screening for signs and symptoms of depression related to chronic disease state  Use CPAP at night   Patient Self Care Activities:  Attend all scheduled provider appointments Call pharmacy for medication refills 3-7 days in advance of running out of medications Call provider office for new concerns or questions  Notify RN Care Manager of [facility] call rescheduling needs Participate in Transition of Care Program/Attend [facility] scheduled calls Perform all self care activities independently  Take medications as prescribed    Plan:  Telephone follow up appointment with care management team member scheduled for:  Friday August 8th at 10:00am        The patient has been provided with contact information for the care management team and has been advised to call with any health related questions or concerns.   Please call the care guide team at [phone] if you need to cancel or reschedule your appointment.   Please call  the Suicide and Crisis Lifeline: 988 call the [place]  National Suicide Prevention Lifeline: 301-869-0431 or TTY: 425-825-1285 TTY 706-642-1520) to talk to a trained counselor if you are experiencing a Mental Health or Behavioral Health Crisis or need someone to talk to.  Medford Balboa, BSN, RN [place]  VBCI - [employer] [facility] RN Care Manager [phone]

## 2023-11-14 ENCOUNTER — Encounter: Payer: Self-pay | Admitting: Family

## 2023-11-14 ENCOUNTER — Ambulatory Visit
Admission: RE | Admit: 2023-11-14 | Discharge: 2023-11-14 | Disposition: A | Discharge status: Home / Self Care | Source: Ambulatory Visit | Attending: Specialist | Admitting: Specialist

## 2023-11-14 ENCOUNTER — Ambulatory Visit (INDEPENDENT_AMBULATORY_CARE_PROVIDER_SITE_OTHER): Admitting: Family

## 2023-11-14 VITALS — BP 124/72 | HR 40 | Ht 64.0 in | Wt 302.2 lb

## 2023-11-14 DIAGNOSIS — J189 Pneumonia, unspecified organism: Secondary | ICD-10-CM | POA: Diagnosis not present

## 2023-11-14 DIAGNOSIS — Z87891 Personal history of nicotine dependence: Secondary | ICD-10-CM | POA: Insufficient documentation

## 2023-11-14 DIAGNOSIS — J45901 Unspecified asthma with (acute) exacerbation: Secondary | ICD-10-CM | POA: Diagnosis not present

## 2023-11-14 MED ORDER — LEVOFLOXACIN 750 MG PO TABS: 750.0000 mg | ORAL_TABLET | Freq: Every day | ORAL | 0 refills | Status: DC

## 2023-11-14 NOTE — Unmapped (Signed)
 [facility] and Home Delivery Pharmacy Refill Coordination Note    Specialty Medication(s) to be Shipped:   Inflammatory Disorders: Taltz     Other medication(s) to be shipped: No additional medications requested for fill at this time     Lisa Carlson, DOB: 1945-05-04  Phone: [phone] (home)       All above HIPAA information was verified with patient.     Was a Nurse, learning disability used for this call? No    Completed refill call assessment today to schedule patient's medication shipment from the [facility]  [phone]).  All relevant notes have been reviewed.     Specialty medication(s) and dose(s) confirmed: Regimen is correct and unchanged.   Changes to medications: Zalayah reports no changes at this time.  Changes to insurance: No  New side effects reported not previously addressed with a pharmacist or physician: None reported  Questions for the pharmacist: No    Confirmed patient received a Conservation officer, historic buildings and a Surveyor, mining with first shipment. The patient will receive a drug information handout for each medication shipped and additional FDA Medication Guides as required.       DISEASE/MEDICATION-SPECIFIC INFORMATION        For patients on injectable medications: Patient currently has 0 doses left.  Next injection is scheduled for 11/20/23.    SPECIALTY MEDICATION ADHERENCE     Medication Adherence    Patient reported X missed doses in the last month: 0  Specialty Medication: ixekizumab  (TALTZ  AUTOINJECTOR) 80 mg/mL AtIn auto-injector  Patient is on additional specialty medications: No  Informant: patient              Were doses missed due to medication being on hold? No    TALTZ  AUTOINJECTOR 80 mg/mL Atin auto-injector (ixekizumab ): 0 doses of medicine on hand       REFERRAL TO PHARMACIST     Referral to the pharmacist: Not needed      [facility]     Shipping address confirmed in Epic.     Cost and Payment: Patient has a $0 copay, payment information is not required.    Delivery Scheduled: Yes, Expected medication delivery date: 11/20/23.     Medication will be delivered via Same Day Courier to the prescription address in Epic WAM.    Jeoffrey JAYSON Sherra [facility] and [facility]

## 2023-11-16 ENCOUNTER — Telehealth: Payer: Self-pay | Admitting: Family

## 2023-11-16 NOTE — Telephone Encounter (Signed)
 Patient called in stating that she still isn't feeling much better and has these boil-like things on her buttocks and her hemorrhoids are out. She reports having trouble walking as well. She says it still feels like something is going to fall out of her bottom. Please advise on what to do.

## 2023-11-17 ENCOUNTER — Other Ambulatory Visit: Payer: Self-pay

## 2023-11-17 NOTE — Patient Instructions (Signed)
 Visit Information  Thank you for taking time to visit with me today. Please don't hesitate to contact me if I can be of assistance to you before our next scheduled telephone appointment.  Our next appointment is by telephone on Friday 11/24/23 at 11:00am  Following is a copy of your care plan:   Goals Addressed             This Visit's Progress    VBCI Transitions of Care (TOC) Care Plan       Problems: (reviewed 11/17/23) Recent Hospitalization for treatment of COPD  Goal: (reviewed 11/17/23) Over the next 30 days, the patient will not experience hospital readmission  Interventions: (reviewed 11/17/23)  COPD Interventions: Advised patient to track and manage COPD triggers Assessed social determinant of health barriers Discussed the importance of adequate rest and management of fatigue with COPD Provided education about and advised patient to utilize infection prevention strategies to reduce risk of respiratory infection Provided instruction about proper use of medications used for management of COPD including inhalers Screening for signs and symptoms of depression related to chronic disease state  Use CPAP at night  Levoflaxcin for 7 days  Patient Self Care Activities:  Attend all scheduled provider appointments Call pharmacy for medication refills 3-7 days in advance of running out of medications Call provider office for new concerns or questions  Notify RN Care Manager of [facility] call rescheduling needs Participate in Transition of Care Program/Attend [facility] scheduled calls Perform all self care activities independently  Take medications as prescribed   11/17/23 - The patient states she has some boils on her buttocks and her hemorrhoids are bothering her.  Plan:  Telephone follow up appointment with care management team member scheduled for:  Friday August 15th at 11:00am        Patient verbalizes understanding of instructions and care plan provided today and agrees to view in  MyChart. Active MyChart status and patient understanding of how to access instructions and care plan via MyChart confirmed with patient.     The patient has been provided with contact information for the care management team and has been advised to call with any health related questions or concerns.   Please call the care guide team at [phone] if you need to cancel or reschedule your appointment.   Please call the Suicide and Crisis Lifeline: 988 call the [place]  National Suicide Prevention Lifeline: 8280390730 or TTY: (201)604-8615 TTY [phone]) to talk to a trained counselor if you are experiencing a Mental Health or Behavioral Health Crisis or need someone to talk to.  Medford Balboa, BSN, RN [place]  VBCI - [employer] [facility] RN Care Manager [phone]

## 2023-11-17 NOTE — Transitions of Care (Post Inpatient/ED Visit) (Signed)
 Transition of Care week 2  Visit Note  11/17/2023  Name: Patricia Walker MRN: [id number]          DOB: Jun 08, 1945  Situation: [facility]. Visit completed with Tilton Hyndman by telephone.   Background:   Past Medical History:  Diagnosis Date   Anxiety    Arthritis    Asthma    COPD exacerbation (HCC) 10/08/2020   Coronary artery disease    mild, nonobstructive   Depression    Dyspnea    on exertion   Dysrhythmia    Atrial Fibrillation   GERD (gastroesophageal reflux disease)    Heart murmur    Hyperlipidemia    Hypertension    Hypothyroidism    MI, old 2016   nonobstructive CAD by Cath   Morbid obesity (HCC)    Persistent atrial fibrillation (HCC)    Pneumonia 06/2018   and RSV   Psoriasis    Sleep apnea    compliant with CPAP   Thyroid  disease     Assessment: Patient Reported Symptoms: Cognitive Cognitive Status: Alert and oriented to person, place, and time      Neurological Neurological Review of Symptoms: Other: [place] Neurological Symptoms/Conditions [RPT]: Neuropathy Neurological Management Strategies: Medication therapy Neurological Comment: 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 Encouraged her to take daily  Respiratory Respiratory Symptoms Reported: Productive cough, Shortness of breath Additional Respiratory Details: The patient has been started back on antibiotics due to continued productive cough. the patient is coughing while on the phone. She states she is feeling weak and somewhat lightheaded. She is taking her Triligy and uses her nebulizer and inhalers Respiratory Management Strategies: CPAP,  Activity, Adequate rest, Fluid modification, Routine screening  Endocrine Endocrine Symptoms Reported: No symptoms reported    Gastrointestinal Gastrointestinal Symptoms Reported: Constipation Additional Gastrointestinal Details: The patient has placed a call to her provider to ask what she can take Gastrointestinal Management Strategies: Incontinence garment/pad Gastrointestinal Comment: The patient is having constipation and is straining and she is now complaining of hemorroids    Genitourinary Genitourinary Symptoms Reported: No symptoms reported    Integumentary Integumentary Symptoms Reported: No symptoms reported    Musculoskeletal Musculoskelatal Symptoms Reviewed: Back pain, Joint pain Musculoskeletal Management Strategies: Medication therapy, Routine screening, Adequate rest Musculoskeletal Comment: the patient received cortisone injection to her knees. She has had decreased mobility lately but she declines HHPT referral   Fall risk Follow up: Falls evaluation completed, Falls prevention discussed, Education provided  Psychosocial Psychosocial Symptoms Reported: No symptoms reported         There were no vitals filed for this visit.  Medications Reviewed Today     Reviewed by Moises Reusing, RN (Case Manager) on 11/17/23 at 1012  Med List Status: <None>   Medication Order Taking? Sig Documenting Provider Last Dose Status Informant  acetaminophen  (TYLENOL ) 325 MG tablet [id number]  Take 2 tablets (650 mg total) by mouth every 6 (six) hours as needed for mild pain (pain score 1-3) or moderate pain (pain score 4-6). Jens Durand, MD  Active Self  albuterol  (VENTOLIN  HFA) 108 [phone] Base) MCG/ACT inhaler [id number]  INHALE 1-2 PUFFS EVERY 4-6 HOURS AS NEEDED FOR SHORTNESS OF BREATH [name],  FNP  Active Self  atorvastatin  (LIPITOR) 40 MG tablet [id number]  ONCE A DAY FOR CHOLESTEROL [name], FNP  Active Self           Med Note CATHY OVAL VEAR [place] Nov 05, 2023  5:08 PM)    chlorpheniramine-HYDROcodone  (TUSSIONEX) 10-8 MG/5ML [id number]  Take 5 mLs by mouth every 12 (twelve) hours as needed for cough. [name], FNP  Active Self  ELIQUIS  5 MG TABS tablet [id number]  TAKE 1 TABLET BY MOUTH TWICE A DAY [name], FNP  Active Self  enalapril  (VASOTEC ) 2.5 MG tablet [id number]  TAKE 1 TABLET BY MOUTH EVERY DAY [name], FNP  Active Self  esomeprazole (NEXIUM) 40 MG capsule [id number]  TAKE 1 CAPSULE BY MOUTH EVERY DAY IN THE MORNING [name], FNP  Active Self  fluticasone  (FLONASE ) 50 MCG/ACT nasal spray [id number]  Place 1 spray into both nostrils daily. Jens Durand, MD  Active   furosemide  (LASIX ) 40 MG tablet [id number] Yes Take 0.5 tablets (20 mg total) by mouth daily.  Patient taking differently: Take 20 mg by mouth daily as needed.   Jens Durand, MD  Active Self  gabapentin  (NEURONTIN ) 300 MG capsule [id number]  Take 300 mg by mouth daily. [provider]  Active Self  ipratropium-albuterol  (DUONEB) 0.5-2.5 (3) MG/3ML SOLN [id number]  Take 3 mLs by nebulization every 6 (six) hours as needed. [name], FNP  Active Self  levofloxacin  (LEVAQUIN ) 750 MG tablet [id number]  Take 1 tablet (750 mg total) by mouth daily. [name], FNP  Active   meloxicam (MOBIC) 15 MG tablet [id number]  Take 15 mg by mouth daily. [provider]  Active Self  metoprolol  succinate (TOPROL -XL) 25 MG 24 hr tablet [id number]  TAKE 1 TABLET (25 MG TOTAL) BY MOUTH DAILY. [name], FNP  Active Self  nystatin  powder [id number]  Apply 1 Application topically 3 (three) times daily as needed. Irritation under breast and groin area [name], FNP  Active Self  TALTZ 80 MG/ML SOAJ [id number]  Inject 80 mg into the skin every 28 (twenty-eight) days. [provider]  Active Self  TRELEGY ELLIPTA 100-62.5-25 MCG/ACT AEPB [id number]  Inhale 1 puff into the lungs daily. [provider]  Active  Self  Vibegron  (GEMTESA ) 75 MG TABS [id number]  Take 1 tablet (75 mg total) by mouth daily. [name], FNP  Active Self            Recommendation:   Continue Current Plan of Care  Follow Up Plan:   Telephone follow-up in 1 week  Medford Balboa, BSN, RN [place]  VBCI - [facility] RN Care Manager [phone]

## 2023-11-18 ENCOUNTER — Encounter: Payer: Self-pay | Admitting: Family

## 2023-11-18 NOTE — Progress Notes (Signed)
 Established Patient Office Visit  Subjective:  Patient ID: Patricia Walker, female    DOB: 1945/05/06  Age: 78 y.o. MRN: [id number]  Chief Complaint  Patient presents with   Hospitalization Follow-up    Patient is here today for Hospital. Follow up. She was recently admitted with a reactive airway exacerbation, and was found at the Time to have pneumonia. She was started on amoxicillin  Clavulanate, but her sputum culture showed Arlyss negative rods. In addition to grand positive. Nanetta. As a result, I am worried that this Rawe not be enough coverage. She is still coughing and feels very short of breath and tired. No other concerns at this time.    No other concerns at this time.   Past Medical History:  Diagnosis Date   Anxiety    Arthritis    Asthma    COPD exacerbation (HCC) 10/08/2020   Coronary artery disease    mild, nonobstructive   Depression    Dyspnea    on exertion   Dysrhythmia    Atrial Fibrillation   GERD (gastroesophageal reflux disease)    Heart murmur    Hyperlipidemia    Hypertension    Hypothyroidism    MI, old 2016   nonobstructive CAD by Cath   Morbid obesity (HCC)    Persistent atrial fibrillation (HCC)    Pneumonia 06/2018   and RSV   Psoriasis    Sleep apnea    compliant with CPAP   Thyroid  disease     Past Surgical History:  Procedure Laterality Date   ARTERY BIOPSY Right 07/21/2017   Procedure: BIOPSY TEMPORAL ARTERY;  Surgeon: Jama Cordella MATSU, MD;  Location: [facility];  Service: Vascular;  Laterality: Right;   CARDIAC CATHETERIZATION     CARDIOVERSION Right 09/01/2016   Procedure: Cardioversion;  Surgeon: Fernand Denyse LABOR, MD;  Location: [facility];  Service: Cardiovascular;  Laterality: Right;   CARDIOVERSION N/A 09/09/2016   Procedure: Cardioversion;  Surgeon: Fernand Denyse LABOR, MD;  Location: [facility];  Service: Cardiovascular;  Laterality: N/A;   CATARACT EXTRACTION W/PHACO Right 03/26/2019   Procedure: CATARACT EXTRACTION PHACO AND  INTRAOCULAR LENS PLACEMENT (IOC) RIGHT 6.45, 00:39.9;  Surgeon: Jaye Fallow, MD;  Location: [facility];  Service: Ophthalmology;  Laterality: Right;   CATARACT EXTRACTION W/PHACO Left 04/16/2019   Procedure: CATARACT EXTRACTION PHACO AND INTRAOCULAR LENS PLACEMENT (IOC) LEFT;   3.14, 00:24.9;  Surgeon: Jaye Fallow, MD;  Location: [facility];  Service: Ophthalmology;  Laterality: Left;  sleep apnea-CPAP   COLONOSCOPY WITH PROPOFOL  N/A 08/28/2019   Procedure: COLONOSCOPY WITH PROPOFOL ;  Surgeon: Toledo, Ladell POUR, MD;  Location: [facility];  Service: Gastroenterology;  Laterality: N/A;   ELECTROPHYSIOLOGIC STUDY N/A 02/01/2016   Procedure: CARDIOVERSION;  Surgeon: Denyse LABOR Fernand, MD;  Location: [facility];  Service: Cardiovascular;  Laterality: N/A;   ESOPHAGEAL DILATION     ESOPHAGOGASTRODUODENOSCOPY (EGD) WITH PROPOFOL  N/A 02/06/2017   Procedure: ESOPHAGOGASTRODUODENOSCOPY (EGD) WITH PROPOFOL ;  Surgeon: Therisa Bi, MD;  Location: [facility] END[facility];  Service: Gastroenterology;  Laterality: N/A;   ESOPHAGOGASTRODUODENOSCOPY (EGD) WITH PROPOFOL  N/A 08/28/2019   Procedure: ESOPHAGOGASTRODUODENOSCOPY (EGD) WITH PROPOFOL ;  Surgeon: Toledo, Ladell POUR, MD;  Location: [facility];  Service: Gastroenterology;  Laterality: N/A;   EUS N/A 02/16/2017   Procedure: FULL UPPER ENDOSCOPIC ULTRASOUND (EUS) RADIAL;  Surgeon: Elta Fonda SQUIBB, MD;  Location: [facility];  Service: Gastroenterology;  Laterality: N/A;   LEFT HEART CATH AND CORONARY ANGIOGRAPHY N/A 09/08/2016   Procedure: Left Heart Cath and Coronary  Angiography;  Surgeon: Fernand Denyse LABOR, MD;  Location: [facility] IN[facility];  Service: Cardiovascular;  Laterality: N/A;   US  ECHOCARDIOGRAPHY      Social History   Socioeconomic History   Marital status: Divorced    Spouse name: Not on file   Number of children: Not on file   Years of education: Not on file   Highest education level: Not on file  Occupational History    Not on file  Tobacco Use   Smoking status: Former    Current packs/day: 0.00    Types: Cigarettes    Quit date: 02/01/2013    Years since quitting: 10.8   Smokeless tobacco: Never  Vaping Use   Vaping status: Never Used  Substance and Sexual Activity   Alcohol use: No   Drug use: No   Sexual activity: Not on file  Other Topics Concern   Not on file  Social History Narrative   Lives in [place] with multiple family members   unemployed   Social Drivers of Health   Financial Resource Strain: Low Risk  (10/23/2023)   Received from [facility]   Overall Financial Resource Strain (CARDIA)    Difficulty of Paying Living Expenses: Not very hard  Food Insecurity: No Food Insecurity (11/10/2023)   Hunger Vital Sign    Worried About Running Out of Food in the Last Year: Never true    Ran Out of Food in the Last Year: Never true  Transportation Needs: No Transportation Needs (11/10/2023)   PRAPARE - Administrator, Civil Service (Medical): No    Lack of Transportation (Non-Medical): No  Physical Activity: Not on file  Stress: Not on file  Social Connections: Moderately Integrated (11/05/2023)   Social Connection and Isolation Panel    Frequency of Communication with Friends and Family: More than three times a week    Frequency of Social Gatherings with Friends and Family: More than three times a week    Attends Religious Services: More than 4 times per year    Active Member of [employer] or Organizations: Not on file    Attends Banker Meetings: 1 to 4 times per year    Marital Status: Divorced  Intimate Partner Violence: Not At Risk (11/10/2023)   Humiliation, Afraid, Rape, and Kick questionnaire    Fear of Current or Ex-Partner: No    Emotionally Abused: No    Physically Abused: No    Sexually Abused: No    Family History  Problem Relation Age of Onset   Breast cancer Paternal Aunt    Other Father    Heart attack Father     Allergies   Allergen Reactions   Codeine Hives, Itching and Rash   Other Itching and Other (See Comments)    States antibiotic in the past caused itching but can not remember name    Review of Systems  Constitutional:  Positive for malaise/fatigue.  Respiratory:  Positive for cough, shortness of breath and wheezing.   All other systems reviewed and are negative.      Objective:   BP 124/72   Pulse (!) 40   Ht 5' 4 (1.626 m)   Wt (!) 302 lb 3.2 oz (137.1 kg)   SpO2 95%   BMI 51.87 kg/m   Vitals:   11/14/23 1016  BP: 124/72  Pulse: (!) 40  Height: 5' 4 (1.626 m)  Weight: (!) 302 lb 3.2 oz (137.1 kg)  SpO2: 95%  BMI (  Calculated): 51.85    Physical Exam Vitals and nursing note reviewed.  Constitutional:      Appearance: Normal appearance. She is normal weight.  HENT:     Head: Normocephalic.  Eyes:     Extraocular Movements: Extraocular movements intact.     Conjunctiva/sclera: Conjunctivae normal.     Pupils: Pupils are equal, round, and reactive to light.  Cardiovascular:     Rate and Rhythm: Normal rate.  Pulmonary:     Effort: Pulmonary effort is normal.  Neurological:     General: No focal deficit present.     Mental Status: She is alert and oriented to person, place, and time. Mental status is at baseline.  Psychiatric:        Mood and Affect: Mood normal.        Behavior: Behavior normal.        Thought Content: Thought content normal.        Judgment: Judgment normal.      No results found for any visits on 11/14/23.  Recent Results (from the past 2160 hours)  POCT rapid strep A     Status: None   Collection Time: 11/01/23  3:36 PM  Result Value Ref Range   Rapid Strep A Screen Negative Negative  POC Influenza A&B (Binax test)     Status: None   Collection Time: 11/01/23  3:37 PM  Result Value Ref Range   Influenza A, POC Negative Negative   Influenza B, POC Negative Negative  COVID-19, Flu A+B and RSV     Status: None   Collection Time: 11/01/23   4:22 PM  Result Value Ref Range   SARS-CoV-2, NAA Not Detected Not Detected   Influenza A, NAA Not Detected Not Detected   Influenza B, NAA Not Detected Not Detected   RSV, NAA Not Detected Not Detected   Test Information: Comment     Comment: This nucleic acid amplification test was developed and its performance characteristics determined by [employer]. Nucleic acid amplification tests include RT-PCR and TMA. This test has not been FDA cleared or approved. This test has been authorized by FDA under an Emergency Use Authorization (EUA). This test is only authorized for the duration of time the declaration that circumstances exist justifying the authorization of the emergency use of in vitro diagnostic tests for detection of SARS-CoV-2 virus and/or diagnosis of COVID-19 infection under section 564(b)(1) of the Act, 21 U.S.C. 639aaa-6(a) (1), unless the authorization is terminated or revoked sooner. When diagnostic testing is negative, the possibility of a false negative result should be considered in the context of a patient's recent exposures and the presence of clinical signs and symptoms consistent with COVID-19. An individual without symptoms of COVID-19 and who is not shedding SARS-CoV-2 virus wo uld expect to have a negative (not detected) result in this assay.   Basic metabolic panel     Status: Abnormal   Collection Time: 11/05/23  7:49 AM  Result Value Ref Range   Sodium 136 135 - 145 mmol/L   Potassium 3.8 3.5 - 5.1 mmol/L   Chloride 101 98 - 111 mmol/L   CO2 25 22 - 32 mmol/L   Glucose, Bld 133 (H) 70 - 99 mg/dL    Comment: Glucose reference range applies only to samples taken after fasting for at least 8 hours.   BUN 10 8 - 23 mg/dL   Creatinine, Ser 9.03 0.44 - 1.00 mg/dL   Calcium  8.7 (L) 8.9 - 10.3 mg/dL  GFR, Estimated >60 >60 mL/min    Comment: (NOTE) Calculated using the CKD-EPI Creatinine Equation (2021)    Anion gap 10 5 - 15    Comment:  Performed at [facility], [address] [place], [place] [address]  CBC     Status: None   Collection Time: 11/05/23  7:49 AM  Result Value Ref Range   WBC 8.2 4.0 - 10.5 K/uL   RBC 5.07 3.87 - 5.11 MIL/uL   Hemoglobin 14.0 12.0 - 15.0 g/dL   HCT 56.6 63.9 - 53.9 %   MCV 85.4 80.0 - 100.0 fL   MCH 27.6 26.0 - 34.0 pg   MCHC 32.3 30.0 - 36.0 g/dL   RDW 86.6 88.4 - 84.4 %   Platelets 312 150 - 400 K/uL   nRBC 0.0 0.0 - 0.2 %    Comment: Performed at [facility], [address] [place], [place] [address]  Lipase, blood     Status: None   Collection Time: 11/05/23  7:49 AM  Result Value Ref Range   Lipase 23 11 - 51 U/L    Comment: Performed at [facility], [address] [place], [place] [address]  Brain natriuretic peptide     Status: Abnormal   Collection Time: 11/05/23  8:24 AM  Result Value Ref Range   B Natriuretic Peptide 241.8 (H) 0.0 - 100.0 pg/mL    Comment: Performed at [facility], [address] [place], [place] [address]  Magnesium      Status: None   Collection Time: 11/05/23  8:24 AM  Result Value Ref Range   Magnesium  1.9 1.7 - 2.4 mg/dL    Comment: Performed at [facility], [address] [place], [place] [address]  Procalcitonin     Status: None   Collection Time: 11/05/23  8:24 AM  Result Value Ref Range   Procalcitonin <0.10 ng/mL    Comment:        Interpretation: PCT (Procalcitonin) <= 0.5 ng/mL: Systemic infection (sepsis) is not likely. Local bacterial infection is possible. (NOTE)       Sepsis PCT Algorithm           Lower Respiratory Tract                                      Infection PCT Algorithm    ----------------------------     ----------------------------         PCT < 0.25 ng/mL                PCT < 0.10 ng/mL          Strongly encourage             Strongly discourage   discontinuation of antibiotics    initiation of antibiotics    ----------------------------      -----------------------------       PCT 0.25 - 0.50 ng/mL            PCT 0.10 - 0.25 ng/mL               OR       >80% decrease in PCT            Discourage initiation of  antibiotics      Encourage discontinuation           of antibiotics    ----------------------------     -----------------------------         PCT >= 0.50 ng/mL              PCT 0.26 - 0.50 ng/mL               AND        <80% decrease in PCT             Encourage initiation of                                             antibiotics       Encourage continuation           of antibiotics    ----------------------------     -----------------------------        PCT >= 0.50 ng/mL                  PCT > 0.50 ng/mL               AND         increase in PCT                  Strongly encourage                                      initiation of antibiotics    Strongly encourage escalation           of antibiotics                                     -----------------------------                                           PCT <= 0.25 ng/mL                                                 OR                                        > 80% decrease in PCT                                      Discontinue / Do not initiate                                             antibiotics  Performed at [facility], [address], [place], [place] [address]   D-dimer, quantitative     Status: None   Collection Time: 11/05/23  8:24 AM  Result Value Ref Range   D-Dimer, Quant 0.48 0.00 - 0.50 ug/mL-FEU    Comment: (NOTE) At the manufacturer cut-off value of 0.5 g/mL FEU, this assay has a negative predictive value of 95-100%.This assay is intended for use in conjunction with a clinical pretest probability (PTP) assessment model to exclude pulmonary embolism (PE) and deep venous thrombosis (DVT) in outpatients suspected of PE or DVT. Results should be correlated with clinical  presentation. Performed at [facility], [address] [place], [place] [address]   Blood gas, venous     Status: Abnormal   Collection Time: 11/05/23  8:24 AM  Result Value Ref Range   pH, Ven 7.39 7.25 - 7.43   pCO2, Ven 49 44 - 60 mmHg   pO2, Ven 35 32 - 45 mmHg   Bicarbonate 29.7 (H) 20.0 - 28.0 mmol/L   Acid-Base Excess 3.7 (H) 0.0 - 2.0 mmol/L   O2 Saturation 62.7 %   Patient temperature 37.0    Collection site VEIN     Comment: Performed at [facility], [address] [place], [place] [address]  Resp panel by RT-PCR (RSV, Flu A&B, Covid) Anterior Nasal Swab     Status: None   Collection Time: 11/05/23  8:24 AM   Specimen: Anterior Nasal Swab  Result Value Ref Range   SARS Coronavirus 2 by RT PCR NEGATIVE NEGATIVE    Comment: (NOTE) SARS-CoV-2 target nucleic acids are NOT DETECTED.  The SARS-CoV-2 RNA is generally detectable in upper respiratory specimens during the acute phase of infection. The lowest concentration of SARS-CoV-2 viral copies this assay can detect is 138 copies/mL. A negative result does not preclude SARS-Cov-2 infection and should not be used as the sole basis for treatment or other patient management decisions. A negative result Schuermann occur with  improper specimen collection/handling, submission of specimen other than nasopharyngeal swab, presence of viral mutation(s) within the areas targeted by this assay, and inadequate number of viral copies(<138 copies/mL). A negative result must be combined with clinical observations, patient history, and epidemiological information. The expected result is Negative.  Fact Sheet for Patients:  BloggerCourse.com  Fact Sheet for Healthcare Providers:  SeriousBroker.it  This test is no t yet approved or cleared by the [place]  FDA and  has been authorized for detection and/or diagnosis of SARS-CoV-2 by FDA under an Emergency Use  Authorization (EUA). This EUA will remain  in effect (meaning this test can be used) for the duration of the COVID-19 declaration under Section 564(b)(1) of the Act, 21 U.S.C.section 360bbb-3(b)(1), unless the authorization is terminated  or revoked sooner.       Influenza A by PCR NEGATIVE NEGATIVE   Influenza B by PCR NEGATIVE NEGATIVE    Comment: (NOTE) The Xpert Xpress SARS-CoV-2/FLU/RSV plus assay is intended as an aid in the diagnosis of influenza from Nasopharyngeal swab specimens and should not be used as a sole basis for treatment. Nasal washings and aspirates are unacceptable for Xpert Xpress SARS-CoV-2/FLU/RSV testing.  Fact Sheet for Patients: BloggerCourse.com  Fact Sheet for Healthcare Providers: SeriousBroker.it  This test is not yet approved or cleared by the [place]  FDA and has been authorized for detection and/or diagnosis of SARS-CoV-2 by FDA under an Emergency Use Authorization (EUA). This EUA will remain in effect (meaning this test can be used) for the duration of the COVID-19 declaration under Section 564(b)(1) of the Act, 21 U.S.C. section 360bbb-3(b)(1), unless the authorization is terminated or revoked.  Resp Syncytial Virus by PCR NEGATIVE NEGATIVE    Comment: (NOTE) Fact Sheet for Patients: BloggerCourse.com  Fact Sheet for Healthcare Providers: SeriousBroker.it  This test is not yet approved or cleared by the [place]  FDA and has been authorized for detection and/or diagnosis of SARS-CoV-2 by FDA under an Emergency Use Authorization (EUA). This EUA will remain in effect (meaning this test can be used) for the duration of the COVID-19 declaration under Section 564(b)(1) of the Act, 21 U.S.C. section 360bbb-3(b)(1), unless the authorization is terminated or revoked.  Performed at [facility], [address]  [place], [place] [address]   Respiratory (~20 pathogens) panel by PCR     Status: None   Collection Time: 11/05/23 10:48 AM   Specimen: Nasopharyngeal Swab; Respiratory  Result Value Ref Range   Adenovirus NOT DETECTED NOT DETECTED   Coronavirus 229E NOT DETECTED NOT DETECTED    Comment: (NOTE) The Coronavirus on the Respiratory Panel, DOES NOT test for the novel  Coronavirus (2019 nCoV)    Coronavirus HKU1 NOT DETECTED NOT DETECTED   Coronavirus NL63 NOT DETECTED NOT DETECTED   Coronavirus OC43 NOT DETECTED NOT DETECTED   Metapneumovirus NOT DETECTED NOT DETECTED   Rhinovirus / Enterovirus NOT DETECTED NOT DETECTED   Influenza A NOT DETECTED NOT DETECTED   Influenza B NOT DETECTED NOT DETECTED   Parainfluenza Virus 1 NOT DETECTED NOT DETECTED   Parainfluenza Virus 2 NOT DETECTED NOT DETECTED   Parainfluenza Virus 3 NOT DETECTED NOT DETECTED   Parainfluenza Virus 4 NOT DETECTED NOT DETECTED   Respiratory Syncytial Virus NOT DETECTED NOT DETECTED   Bordetella pertussis NOT DETECTED NOT DETECTED   Bordetella Parapertussis NOT DETECTED NOT DETECTED   Chlamydophila pneumoniae NOT DETECTED NOT DETECTED   Mycoplasma pneumoniae NOT DETECTED NOT DETECTED    Comment: Performed at [facility], [address] [place], [place] [address]  Expectorated Sputum Assessment w Gram Stain, Rflx to Resp Cult     Status: None   Collection Time: 11/05/23 11:03 AM   Specimen: Expectorated Sputum  Result Value Ref Range   Specimen Description EXPECTORATED SPUTUM    Special Requests NONE    Sputum evaluation      THIS SPECIMEN IS ACCEPTABLE FOR SPUTUM CULTURE Performed at [facility], [address], [place], [place] [address]    Report Status 11/05/2023 FINAL   Culture, Respiratory w Gram Stain     Status: None   Collection Time: 11/05/23 11:03 AM  Result Value Ref Range   Specimen Description      EXPECTORATED SPUTUM Performed at [facility], [address]  [place], [place] [address]    Special Requests      NONE Reflexed from [phone] Performed at [facility], [address] [place], [place] [address]    Gram Stain      RARE WBC SEEN MODERATE GRAM NEGATIVE RODS MODERATE GRAM POSITIVE COCCI    Culture      MODERATE HAEMOPHILUS INFLUENZAE BETA LACTAMASE NEGATIVE Performed at [facility], [address], [place], [place] [address]    Report Status 11/07/2023 FINAL   CBC     Status: None   Collection Time: 11/07/23  3:39 AM  Result Value Ref Range   WBC 8.4 4.0 - 10.5 K/uL   RBC 4.89 3.87 - 5.11 MIL/uL   Hemoglobin 13.5 12.0 - 15.0 g/dL   HCT 57.3 63.9 - 53.9 %   MCV 87.1 80.0 - 100.0 fL  MCH 27.6 26.0 - 34.0 pg   MCHC 31.7 30.0 - 36.0 g/dL   RDW 86.7 88.4 - 84.4 %   Platelets 324 150 - 400 K/uL   nRBC 0.0 0.0 - 0.2 %    Comment: Performed at [facility], [address] [place], [place] [address]  Basic metabolic panel with GFR     Status: Abnormal   Collection Time: 11/07/23  3:39 AM  Result Value Ref Range   Sodium 139 135 - 145 mmol/L   Potassium 5.0 3.5 - 5.1 mmol/L   Chloride 105 98 - 111 mmol/L   CO2 27 22 - 32 mmol/L   Glucose, Bld 173 (H) 70 - 99 mg/dL    Comment: Glucose reference range applies only to samples taken after fasting for at least 8 hours.   BUN 31 (H) 8 - 23 mg/dL   Creatinine, Ser 8.94 (H) 0.44 - 1.00 mg/dL   Calcium  8.5 (L) 8.9 - 10.3 mg/dL   GFR, Estimated 55 (L) >60 mL/min    Comment: (NOTE) Calculated using the CKD-EPI Creatinine Equation (2021)    Anion gap 7 5 - 15    Comment: Performed at [facility], [address] [place], [place] [address]  CBC     Status: None   Collection Time: 11/08/23  5:40 AM  Result Value Ref Range   WBC 9.8 4.0 - 10.5 K/uL   RBC 4.96 3.87 - 5.11 MIL/uL   Hemoglobin 13.7 12.0 - 15.0 g/dL   HCT 57.0 63.9 - 53.9 %   MCV 86.5 80.0 - 100.0 fL   MCH 27.6 26.0 - 34.0 pg   MCHC 31.9 30.0 - 36.0 g/dL   RDW 86.7 88.4 - 84.4 %    Platelets 375 150 - 400 K/uL   nRBC 0.0 0.0 - 0.2 %    Comment: Performed at [facility], [address], [place], [place] [address]  Basic metabolic panel with GFR     Status: Abnormal   Collection Time: 11/08/23  5:40 AM  Result Value Ref Range   Sodium 134 (L) 135 - 145 mmol/L   Potassium 4.5 3.5 - 5.1 mmol/L   Chloride 98 98 - 111 mmol/L   CO2 25 22 - 32 mmol/L   Glucose, Bld 217 (H) 70 - 99 mg/dL    Comment: Glucose reference range applies only to samples taken after fasting for at least 8 hours.   BUN 30 (H) 8 - 23 mg/dL   Creatinine, Ser 8.82 (H) 0.44 - 1.00 mg/dL   Calcium  8.8 (L) 8.9 - 10.3 mg/dL   GFR, Estimated 48 (L) >60 mL/min    Comment: (NOTE) Calculated using the CKD-EPI Creatinine Equation (2021)    Anion gap 11 5 - 15    Comment: Performed at [facility], [address] [place], [place] [address]       Assessment & Plan Multifocal pneumonia Exacerbation of reactive airway disease, unspecified asthma severity, unspecified whether persistent Switching antibiotics to levofloxacin  to cover for gram-neg as well.   Pt will let me know if she is not starting to improve by Friday.    Return as scheduled, unless not improving.   Total time spent: 30 minutes  ALAN CHRISTELLA ARRANT, FNP  11/14/2023   This document Innocent have been prepared by [facility] Voice Recognition software and as such Pita include unintentional dictation errors.

## 2023-11-18 NOTE — Assessment & Plan Note (Signed)
 Switching antibiotics to levofloxacin  to cover for gram-neg as well.   Pt will let me know if she is not starting to improve by Friday.

## 2023-11-20 MED FILL — TALTZ AUTOINJECTOR 80 MG/ML SUBCUTANEOUS: SUBCUTANEOUS | 28 days supply | Qty: 1 | Fill #1

## 2023-11-21 ENCOUNTER — Other Ambulatory Visit: Payer: Self-pay | Admitting: Family

## 2023-11-24 ENCOUNTER — Other Ambulatory Visit: Payer: Self-pay

## 2023-11-24 NOTE — Patient Instructions (Signed)
 Visit Information  Thank you for taking time to visit with me today. Please don't hesitate to contact me if I can be of assistance to you before our next scheduled telephone appointment.  Our next appointment is by telephone on Thursday August 21st at 11:00am  Following is a copy of your care plan:   Goals Addressed             This Visit's Progress    VBCI Transitions of Care (TOC) Care Plan       Problems: (reviewed 11/24/23) Recent Hospitalization for treatment of COPD  Goal: (reviewed 11/24/23) Over the next 30 days, the patient will not experience hospital readmission  Interventions: (reviewed 11/24/23)  COPD Interventions: Advised patient to track and manage COPD triggers Assessed social determinant of health barriers Discussed the importance of adequate rest and management of fatigue with COPD Provided education about and advised patient to utilize infection prevention strategies to reduce risk of respiratory infection Provided instruction about proper use of medications used for management of COPD including inhalers Screening for signs and symptoms of depression related to chronic disease state  Use CPAP at night  - 8/15 - The patient would like a new machine because hers is old. She has an appointment with Pulmonary Dr. Theotis 8/19 Levoflaxcin for 7 days - Completed 8/15  Patient Self Care Activities: (reviewed 11/24/23) Attend all scheduled provider appointments Call pharmacy for medication refills 3-7 days in advance of running out of medications Call provider office for new concerns or questions  Notify RN Care Manager of [facility] call rescheduling needs Participate in Transition of Care Program/Attend [facility] scheduled calls Perform all self care activities independently  Take medications as prescribed   11/17/23 - The patient states she has some boils on her buttocks and her hemorrhoids are bothering her.  Plan:  Telephone follow up appointment with care management team  member scheduled for:  Thursday August 21st at 11:00am        Patient verbalizes understanding of instructions and care plan provided today and agrees to view in MyChart. Active MyChart status and patient understanding of how to access instructions and care plan via MyChart confirmed with patient.     The patient has been provided with contact information for the care management team and has been advised to call with any health related questions or concerns.   Please call the care guide team at [phone] if you need to cancel or reschedule your appointment.   Please call the Suicide and Crisis Lifeline: 988 call the [place]  National Suicide Prevention Lifeline: (424)480-1955 or TTY: (217) 412-0568 TTY 218-102-6143) to talk to a trained counselor if you are experiencing a Mental Health or Behavioral Health Crisis or need someone to talk to.  Medford Balboa, BSN, RN [place]  VBCI - [employer] [facility] RN Care Manager [phone]

## 2023-11-24 NOTE — Transitions of Care (Post Inpatient/ED Visit) (Signed)
 Transition of Care week 2  Visit Note  11/24/2023  Name: Patricia Walker MRN: [id number]          DOB: 01/08/1946  Situation: Patient enrolled in [facility] 30-day program. Visit completed with Tilton Vultaggio by telephone.   Background:   Past Medical History:  Diagnosis Date   Anxiety    Arthritis    Asthma    COPD exacerbation (HCC) 10/08/2020   Coronary artery disease    mild, nonobstructive   Depression    Dyspnea    on exertion   Dysrhythmia    Atrial Fibrillation   GERD (gastroesophageal reflux disease)    Heart murmur    Hyperlipidemia    Hypertension    Hypothyroidism    MI, old 2016   nonobstructive CAD by Cath   Morbid obesity (HCC)    Persistent atrial fibrillation (HCC)    Pneumonia 06/2018   and RSV   Psoriasis    Sleep apnea    compliant with CPAP   Thyroid  disease     Assessment: Patient Reported Symptoms: Cognitive Cognitive Status: Alert and oriented to person, place, and time      Neurological Neurological Review of Symptoms: Other: Oher Neurological Symptoms/Conditions [RPT]: Neuropathy Neurological Management Strategies: Medication therapy Neurological Comment: 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 She has finished her antibiotics. She is out today with her family. She states she needs a new CPAP Machine but she has to do a sleep study and she does not want to do that    Endocrine Endocrine Symptoms Reported: No symptoms reported Is patient diabetic?: No    Gastrointestinal  Gastrointestinal Symptoms Reported: Constipation Additional Gastrointestinal Details: The constipatient is better. She has hemorrhoids as a result of the straining Gastrointestinal Management Strategies: Medication therapy, Coping strategies    Genitourinary Genitourinary Symptoms Reported: No symptoms reported    Integumentary Integumentary Symptoms Reported: No symptoms reported    Musculoskeletal Musculoskelatal Symptoms Reviewed: Back pain, Joint pain Musculoskeletal Management Strategies: Medication therapy, Routine screening, Coping strategies, Adequate rest Musculoskeletal Comment: The patient receives cortisone injection to her knees. She tries to stay as active as she can   Fall risk Follow up: Falls evaluation completed, Falls prevention discussed  Psychosocial Psychosocial Symptoms Reported: No symptoms reported         There were no vitals filed for this visit.  Medications Reviewed Today     Reviewed by Moises Reusing, RN (Case Manager) on 11/24/23 at 1104  Med List Status: <None>   Medication Order Taking? Sig Documenting Provider Last Dose Status Informant  acetaminophen  (TYLENOL ) 325 MG tablet [id number]  Take 2 tablets (650 mg total) by mouth every 6 (six) hours as needed for mild pain (pain score 1-3) or moderate pain (pain score 4-6). Jens Durand, MD  Active Self  albuterol  (VENTOLIN  HFA) 108 [phone] Base) MCG/ACT inhaler [id number]  INHALE 1-2 PUFFS EVERY 4-6 HOURS AS NEEDED FOR SHORTNESS OF BREATH [name], FNP  Active Self  atorvastatin  (LIPITOR) 40 MG tablet [id number]  ONCE A DAY FOR CHOLESTEROL [name], FNP  Active Self  Med Note CATHY OVAL VEAR [place] Nov 05, 2023  5:08 PM)    chlorpheniramine-HYDROcodone  (TUSSIONEX) 10-8 MG/5ML [id number]  Take 5 mLs by mouth every 12 (twelve) hours as needed for cough. [name], FNP  Active Self  ELIQUIS  5 MG TABS tablet [id number]  TAKE 1 TABLET BY MOUTH TWICE A DAY [name],  FNP  Active Self  enalapril  (VASOTEC ) 2.5 MG tablet [id number]  TAKE 1 TABLET BY MOUTH EVERY DAY [name], FNP  Active Self  esomeprazole (NEXIUM) 40 MG capsule [id number]  TAKE 1 CAPSULE BY MOUTH EVERY DAY IN THE MORNING [name], FNP  Active   fluticasone  (FLONASE ) 50 MCG/ACT nasal spray [id number]  Place 1 spray into both nostrils daily. Jens Durand, MD  Active   furosemide  (LASIX ) 40 MG tablet [id number]  Take 0.5 tablets (20 mg total) by mouth daily.  Patient taking differently: Take 20 mg by mouth daily as needed.   Jens Durand, MD  Active Self  gabapentin  (NEURONTIN ) 300 MG capsule [id number]  Take 300 mg by mouth daily. [provider]  Active Self  ipratropium-albuterol  (DUONEB) 0.5-2.5 (3) MG/3ML SOLN [id number]  Take 3 mLs by nebulization every 6 (six) hours as needed. [name], FNP  Active Self  levofloxacin  (LEVAQUIN ) 750 MG tablet [id number]  Take 1 tablet (750 mg total) by mouth daily. [name], FNP  Active   lidocaine  (XYLOCAINE ) 2 % solution [id number]  GARGLE THEN SWALLOW 15 ML 5 TIMES DAILY AS NEEDED FOR SORE THROAT. [name], FNP  Active   meloxicam (MOBIC) 15 MG tablet [id number]  Take 15 mg by mouth daily. [provider]  Active Self  metoprolol  succinate (TOPROL -XL) 25 MG 24 hr tablet [id number]  TAKE 1 TABLET (25 MG TOTAL) BY MOUTH DAILY. [name], FNP  Active Self  nystatin  powder [id number]  Apply 1 Application topically 3 (three) times daily as needed. Irritation under breast and groin area [name], FNP  Active Self  TALTZ 80 MG/ML SOAJ [id number]  Inject 80 mg into the skin every 28 (twenty-eight) days. [provider]  Active Self  TRELEGY ELLIPTA 100-62.5-25 MCG/ACT AEPB [id number]  Inhale 1 puff into the lungs daily. [provider]  Active Self  Vibegron  (GEMTESA ) 75 MG TABS [id number]  Take 1 tablet (75 mg total) by mouth daily. [name], FNP  Active Self             Recommendation:   Continue Current Plan of Care  Follow Up Plan:   Telephone follow-up in 1 week  Medford Balboa, BSN, RN [place]  VBCI - [facility] RN Care Manager [phone]

## 2023-11-26 ENCOUNTER — Other Ambulatory Visit: Payer: Self-pay | Admitting: Family

## 2023-11-28 DIAGNOSIS — G4733 Obstructive sleep apnea (adult) (pediatric): Secondary | ICD-10-CM | POA: Diagnosis not present

## 2023-11-28 DIAGNOSIS — R0602 Shortness of breath: Secondary | ICD-10-CM | POA: Diagnosis not present

## 2023-11-28 DIAGNOSIS — F17210 Nicotine dependence, cigarettes, uncomplicated: Secondary | ICD-10-CM | POA: Diagnosis not present

## 2023-11-28 DIAGNOSIS — R911 Solitary pulmonary nodule: Secondary | ICD-10-CM | POA: Diagnosis not present

## 2023-11-28 DIAGNOSIS — J449 Chronic obstructive pulmonary disease, unspecified: Secondary | ICD-10-CM | POA: Diagnosis not present

## 2023-11-30 ENCOUNTER — Other Ambulatory Visit: Payer: Self-pay

## 2023-11-30 NOTE — Transitions of Care (Post Inpatient/ED Visit) (Signed)
 Transition of Care week 3  Visit Note  11/30/2023  Name: Patricia Walker MRN: [id number]          DOB: [birth year]  Situation: Patient enrolled in [facility] 30-day program. Visit completed with Tilton Hollywood by telephone.   Background:   Past Medical History:  Diagnosis Date   Anxiety    Arthritis    Asthma    COPD exacerbation (HCC) 10/08/2020   Coronary artery disease    mild, nonobstructive   Depression    Dyspnea    on exertion   Dysrhythmia    Atrial Fibrillation   GERD (gastroesophageal reflux disease)    Heart murmur    Hyperlipidemia    Hypertension    Hypothyroidism    MI, old 2016   nonobstructive CAD by Cath   Morbid obesity (HCC)    Persistent atrial fibrillation (HCC)    Pneumonia 06/2018   and RSV   Psoriasis    Sleep apnea    compliant with CPAP   Thyroid  disease     Assessment: Patient Reported Symptoms: Cognitive Cognitive Status: Alert and oriented to person, place, and time      Neurological Neurological Review of Symptoms: Other: Oher Neurological Symptoms/Conditions [RPT]: Neuropathy Neurological Management Strategies: Medication therapy Neurological Comment: Neuropathy in her legs due to chronic back pain  HEENT HEENT Symptoms Reported: No symptoms reported      Cardiovascular Cardiovascular Symptoms Reported: Fatigue Does patient have uncontrolled Hypertension?: No Cardiovascular Management Strategies: Medication therapy, Routine screening, Adequate rest Weight: (!) 302 lb (137 kg) Cardiovascular Comment: The patient states she doesn't have any swelling in her feet  Respiratory Respiratory Symptoms Reported: Productive cough, Shortness of breath, Other: Other Respiratory Symptoms: Fatigue Additional Respiratory Details: She is coughing up phelgm while on the phone call. She states she is having a difficult time breathing. She was at her Pulmonolgist office on 11/28/23 and he did not seemed concerned regarding her complaints of SOB. The referral  for a sleep study was not placed yet and her PCP is on vacation this week. The patient will make an appointment with her PCP for next week so she can do the sleep study and get a new CPAP. She states she is using her nebulizer and inhalers Respiratory Management Strategies: CPAP, Adequate rest, Activity, Fluid modification, Routine screening  Endocrine Endocrine Symptoms Reported: No symptoms reported Is patient diabetic?: No    Gastrointestinal Gastrointestinal Symptoms Reported: Constipation Additional Gastrointestinal Details: Loose stools have improved since stopping antibiotics Gastrointestinal Management Strategies: Coping strategies, Medication therapy    Genitourinary Genitourinary Symptoms Reported: No symptoms reported    Integumentary Integumentary Symptoms Reported: No symptoms reported    Musculoskeletal Musculoskelatal Symptoms Reviewed: Back pain, Joint pain Additional Musculoskeletal Details: Shouler pain Musculoskeletal Management Strategies: Medication therapy, Routine screening, Coping strategies, Adequate rest Musculoskeletal Comment: The patient states she needs a cortisone injection in her shoulder because of pain      Psychosocial Psychosocial Symptoms Reported: No symptoms reported         There were no vitals filed for this visit.  Medications Reviewed Today     Reviewed by Moises Reusing, RN (Case Manager) on 11/30/23 at 1104  Med List Status: <None>   Medication Order Taking? Sig Documenting Provider Last Dose Status Informant  acetaminophen  (TYLENOL ) 325 MG tablet [id number]  Take 2 tablets (650 mg total) by mouth every 6 (six) hours as needed for mild pain (pain score 1-3) or moderate pain (pain score 4-6). Jens Durand, MD  Active Self  albuterol  (VENTOLIN  HFA) 108 (90 Base) MCG/ACT inhaler [id number]  INHALE 1-2 PUFFS EVERY 4-6 HOURS AS NEEDED FOR SHORTNESS OF BREATH [name], FNP  Active Self  atorvastatin  (LIPITOR) 40 MG tablet [id number]   ONCE A DAY FOR CHOLESTEROL [name], FNP  Active Self           Med Note CATHY OVAL VEAR [place] Nov 05, 2023  5:08 PM)    chlorpheniramine-HYDROcodone  (TUSSIONEX) 10-8 MG/5ML [id number]  Take 5 mLs by mouth every 12 (twelve) hours as needed for cough. [name], FNP  Active Self  ELIQUIS  5 MG TABS tablet [id number]  TAKE 1 TABLET BY MOUTH TWICE A DAY [name], FNP  Active Self  enalapril  (VASOTEC ) 2.5 MG tablet [id number]  TAKE 1 TABLET BY MOUTH EVERY DAY [name], FNP  Active Self  esomeprazole (NEXIUM) 40 MG capsule [id number]  TAKE 1 CAPSULE BY MOUTH EVERY DAY IN THE MORNING [name], FNP  Active   fluticasone  (FLONASE ) 50 MCG/ACT nasal spray [id number]  Place 1 spray into both nostrils daily. Jens Durand, MD  Active   furosemide  (LASIX ) 40 MG tablet [id number]  Take 0.5 tablets (20 mg total) by mouth daily.  Patient taking differently: Take 20 mg by mouth daily as needed.   Jens Durand, MD  Active Self  gabapentin  (NEURONTIN ) 300 MG capsule [id number]  Take 300 mg by mouth daily. [provider]  Active Self  ipratropium-albuterol  (DUONEB) 0.5-2.5 (3) MG/3ML SOLN [id number]  Take 3 mLs by nebulization every 6 (six) hours as needed. [name], FNP  Active Self  levofloxacin  (LEVAQUIN ) 750 MG tablet [id number]  Take 1 tablet (750 mg total) by mouth daily.  Patient not taking: Reported on 11/30/2023   [name], FNP  Active   lidocaine  (XYLOCAINE ) 2 % solution [id number]  GARGLE THEN SWALLOW 15 ML 5 TIMES DAILY AS NEEDED FOR SORE THROAT. [name], FNP  Active   meloxicam (MOBIC) 15 MG tablet [id number]  Take 15 mg by mouth daily. [provider]  Active Self  metoprolol  succinate (TOPROL -XL) 25 MG 24 hr tablet [id number]  TAKE 1 TABLET (25 MG TOTAL) BY MOUTH DAILY. [name], FNP  Active Self  nystatin  powder [id number]  Apply 1 Application topically 3 (three) times daily as needed. Irritation under breast  and groin area [name], FNP  Active Self  TALTZ 80 MG/ML SOAJ [id number]  Inject 80 mg into the skin every 28 (twenty-eight) days. [provider]  Active Self  TRELEGY ELLIPTA 100-62.5-25 MCG/ACT AEPB [id number]  Inhale 1 puff into the lungs daily. [provider]  Active Self  Vibegron  (GEMTESA ) 75 MG TABS [id number]  Take 1 tablet (75 mg total) by mouth daily. [name], FNP  Active Self            Recommendation:   Continue Current Plan of Care  Follow Up Plan:   Telephone follow-up in 1 week  Medford Balboa, BSN, RN [place]  VBCI - [facility] RN Care Manager [phone]

## 2023-11-30 NOTE — Patient Instructions (Signed)
 Visit Information  Thank you for taking time to visit with me today. Please don't hesitate to contact me if I can be of assistance to you before our next scheduled telephone appointment.  Our next appointment is by telephone on Thursday September 4 at 11:00am  Following is a copy of your care plan:   Goals Addressed             This Visit's Progress    VBCI Transitions of Care (TOC) Care Plan       Problems: (reviewed 11/30/23) Recent Hospitalization for treatment of COPD  Goal: (reviewed 11/30/23) Over the next 30 days, the patient will not experience hospital readmission  Interventions: (reviewed 11/30/23)  COPD Interventions: Advised patient to track and manage COPD triggers Assessed social determinant of health barriers Discussed the importance of adequate rest and management of fatigue with COPD Provided education about and advised patient to utilize infection prevention strategies to reduce risk of respiratory infection Provided instruction about proper use of medications used for management of COPD including inhalers Screening for signs and symptoms of depression related to chronic disease state  Use CPAP at night  - 8/15 - The patient would like a new machine because hers is old. She has an appointment with Pulmonary Dr. Theotis 8/19 11/30/23 - Dr. Theotis did not order a sleep study or CPAP. He is leaving that up to the PCP. The patient had her lung cancer screening completed 11/29/23 Levoflaxcin for 7 days - Completed 8/15 Continue use of Albuterol , Trelegy and Duonebs for SOB  Patient Self Care Activities: (reviewed 11/30/23) Attend all scheduled provider appointments Call pharmacy for medication refills 3-7 days in advance of running out of medications Call provider office for new concerns or questions  Notify RN Care Manager of [facility] call rescheduling needs Participate in Transition of Care Program/Attend TOC scheduled calls Perform all self care activities independently   Take medications as prescribed   11/17/23 - The patient states she has some boils on her buttocks and her hemorrhoids are bothering her.  Plan:  Telephone follow up appointment with care management team member scheduled for:  Thursday September 4th at 11:00am        Patient verbalizes understanding of instructions and care plan provided today and agrees to view in MyChart. Active MyChart status and patient understanding of how to access instructions and care plan via MyChart confirmed with patient.     Telephone follow up appointment with care management team member scheduled for: Face to Face appointment with care management team member scheduled for:   Please call the care guide team at [phone] if you need to cancel or reschedule your appointment.   Please call the Suicide and Crisis Lifeline: 988 call the [place]  National Suicide Prevention Lifeline: 319-405-5516 or TTY: 450-037-9417 TTY 8643481006) to talk to a trained counselor if you are experiencing a Mental Health or Behavioral Health Crisis or need someone to talk to.  Medford Balboa, BSN, RN [place]  VBCI - [employer] [facility] RN Care Manager [phone]

## 2023-12-11 DIAGNOSIS — J45909 Unspecified asthma, uncomplicated: Secondary | ICD-10-CM | POA: Diagnosis not present

## 2023-12-14 ENCOUNTER — Other Ambulatory Visit: Payer: Self-pay

## 2023-12-14 NOTE — Unmapped (Signed)
 [facility] Refill Coordination Note    Specialty Medication(s) to be Shipped:   Inflammatory Disorders: Taltz     Other medication(s) to be shipped: No additional medications requested for fill at this time    Specialty Medications not needed at this time: N/A     Lisa Carlson, DOB: Sep 14, 1945  Phone: [phone] (home)       All above HIPAA information was verified with patient.     Was a Nurse, learning disability used for this call? No    Completed refill call assessment today to schedule patient's medication shipment from the [facility]  [phone]).  All relevant notes have been reviewed.     Specialty medication(s) and dose(s) confirmed: Regimen is correct and unchanged.   Changes to medications: [name] reports no changes at this time.  Changes to insurance: No  New side effects reported not previously addressed with a pharmacist or physician: None reported  Questions for the pharmacist: No    Confirmed patient received a Conservation officer, historic buildings and a Surveyor, mining with first shipment. The patient will receive a drug information handout for each medication shipped and additional FDA Medication Guides as required.       DISEASE/MEDICATION-SPECIFIC INFORMATION        For patients on injectable medications: Next injection is scheduled for 12/18/2023.    SPECIALTY MEDICATION ADHERENCE     Medication Adherence    Patient reported X missed doses in the last month: 0  Specialty Medication: TALTZ  AUTOINJECTOR 80 mg/mL Atin auto-injector (ixekizumab )  Patient is on additional specialty medications: No              Were doses missed due to medication being on hold? No     TALTZ  AUTOINJECTOR 80 mg/mL Atin auto-injector (ixekizumab ): 0 doses of medicine on hand       REFERRAL TO PHARMACIST     Referral to the pharmacist: Not needed      [facility]     Shipping address confirmed in Epic.     Cost and Payment: Patient has a $0 copay, payment information is not required.    Delivery Scheduled: Yes, Expected medication delivery date: 12/18/2023.     Medication will be delivered via Same Day Courier to the prescription address in Epic WAM.    Lisa Carlson   [facility]  Specialty Technician

## 2023-12-14 NOTE — Patient Instructions (Signed)
 Visit Information  Thank you for taking time to visit with me today. Please don't hesitate to contact me if I can be of assistance to you before our next scheduled telephone appointment.  Our next appointment is by telephone on Thursday September 11th at 11am  Following is a copy of your care plan:   Goals Addressed             This Visit's Progress    VBCI Transitions of Care (TOC) Care Plan       Problems: (reviewed 12/14/23) Recent Hospitalization for treatment of COPD  Goal: (reviewed 12/14/23) Over the next 30 days, the patient will not experience hospital readmission  Interventions: (reviewed 12/14/23)  COPD Interventions: Advised patient to track and manage COPD triggers Assessed social determinant of health barriers Discussed the importance of adequate rest and management of fatigue with COPD Provided education about and advised patient to utilize infection prevention strategies to reduce risk of respiratory infection Provided instruction about proper use of medications used for management of COPD including inhalers Screening for signs and symptoms of depression related to chronic disease state  Use CPAP at night  - 8/15 - The patient would like a new machine because hers is old. She has an appointment with Pulmonary Dr. Theotis 8/19 11/30/23 - Dr. Theotis did not order a sleep study or CPAP. He is leaving that up to the PCP. The patient had her lung cancer screening completed 11/29/23 Levoflaxcin for 7 days - Completed 8/15 Continue use of Albuterol , Trelegy and Duonebs for SOB 12/14/23 - The patient states she does not feel well and is continuing to cough and has the sniffles. Needs a refill on Flonase . Message sent to the provider  Patient Self Care Activities: (reviewed 12/14/23) Attend all scheduled provider appointments Call pharmacy for medication refills 3-7 days in advance of running out of medications Call provider office for new concerns or questions  Notify RN Care  Manager of [facility] call rescheduling needs Participate in Transition of Care Program/Attend [facility] scheduled calls Perform all self care activities independently  Take medications as prescribed   11/17/23 - The patient states she has some boils on her buttocks and her hemorrhoids are bothering her.  Plan:  Telephone follow up appointment with care management team member scheduled for:  Thursday September 11that 11:00am        Patient verbalizes understanding of instructions and care plan provided today and agrees to view in MyChart. Active MyChart status and patient understanding of how to access instructions and care plan via MyChart confirmed with patient.     The patient has been provided with contact information for the care management team and has been advised to call with any health related questions or concerns.   Please call the care guide team at [phone] if you need to cancel or reschedule your appointment.   Please call the Suicide and Crisis Lifeline: 988 call the [place]  National Suicide Prevention Lifeline: 810-796-3613 or TTY: 9256584862 TTY 920-754-9079) to talk to a trained counselor if you are experiencing a Mental Health or Behavioral Health Crisis or need someone to talk to.  Medford Balboa, BSN, RN [place]  VBCI - [employer] [facility] RN Care Manager [phone]

## 2023-12-14 NOTE — Transitions of Care (Post Inpatient/ED Visit) (Signed)
 Transition of Care week 4  Visit Note  12/14/2023  Name: Patricia Walker MRN: [id number]          DOB: 05/03/1945  Situation: Patient enrolled in [facility] 30-day program. Visit completed with Tilton Jabbour by telephone.   Background:   Past Medical History:  Diagnosis Date   Anxiety    Arthritis    Asthma    COPD exacerbation (HCC) 10/08/2020   Coronary artery disease    mild, nonobstructive   Depression    Dyspnea    on exertion   Dysrhythmia    Atrial Fibrillation   GERD (gastroesophageal reflux disease)    Heart murmur    Hyperlipidemia    Hypertension    Hypothyroidism    MI, old 2016   nonobstructive CAD by Cath   Morbid obesity (HCC)    Persistent atrial fibrillation (HCC)    Pneumonia 06/2018   and RSV   Psoriasis    Sleep apnea    compliant with CPAP   Thyroid  disease     Assessment: Patient Reported Symptoms: Cognitive Cognitive Status: Alert and oriented to person, place, and time      Neurological Neurological Review of Symptoms: Other: [place] Neurological Symptoms/Conditions [RPT]: Neuropathy Neurological Management Strategies: Medication therapy, Coping strategies, Routine screening  HEENT HEENT Symptoms Reported: No symptoms reported      Cardiovascular Cardiovascular Symptoms Reported: Fatigue Does patient have uncontrolled Hypertension?: No Cardiovascular Management Strategies: Medication therapy, Routine screening, Adequate rest  Respiratory Respiratory Symptoms Reported: Shortness of breath, Productive cough, Other: Other Respiratory Symptoms: Fatigue, weakness, not feeling well. Additional Respiratory Details: The patient is coughig up phelgm which she states is greenish in color. She states her grandson has bronchitis and she feels like she [place] getting sick with it also. Encouraged the patient to call PCP and schedule an appointment. She needs her Flonase  refilled. Message sent to the provider. Encourage rest and fluids and medications. Respiratory  Management Strategies: Adequate rest, Activity, Routine screening, Medication therapy, Coping strategies  Endocrine Endocrine Symptoms Reported: No symptoms reported Is patient diabetic?: No    Gastrointestinal Gastrointestinal Symptoms Reported: No symptoms reported Gastrointestinal Comment: The patient reports improvement with constipation and hemorrhoids    Genitourinary Genitourinary Symptoms Reported: No symptoms reported    Integumentary Integumentary Symptoms Reported: No symptoms reported    Musculoskeletal Musculoskelatal Symptoms Reviewed: Joint pain, Back pain Additional Musculoskeletal Details: shoulder pain Musculoskeletal Management Strategies: Medication therapy, Routine screening, Coping strategies, Adequate rest Musculoskeletal Comment: The patient had an appointment on 12/15/23 for injections to her shoulder but cancelled because she does not feel well. She will reschedle      Psychosocial Psychosocial Symptoms Reported: No symptoms reported         There were no vitals filed for this visit.  Medications Reviewed Today     Reviewed by Patricia Reusing, RN (Case Manager) on 12/14/23 at 1102  Med List Status: <None>   Medication Order Taking? Sig Documenting Provider Last Dose Status Informant  acetaminophen  (TYLENOL ) 325 MG tablet [id number]  Take 2 tablets (650 mg total) by mouth every 6 (six) hours as needed for mild pain (pain score 1-3) or moderate pain (pain score 4-6). Jens Durand, MD  Active Self  albuterol  (VENTOLIN  HFA) 108 [phone] Base) MCG/ACT inhaler [id number]  INHALE 1-2 PUFFS EVERY 4-6 HOURS AS NEEDED FOR SHORTNESS OF BREATH [name], FNP  Active Self  atorvastatin  (LIPITOR) 40 MG tablet [id number]  ONCE A DAY FOR CHOLESTEROL [name], FNP  Active Self           Med Note CATHY OVAL VEAR [place] Nov 05, 2023  5:08 PM)    chlorpheniramine-HYDROcodone  (TUSSIONEX) 10-8 MG/5ML [id number]  Take 5 mLs by mouth every 12 (twelve) hours as  needed for cough. [name], FNP  Active Self  ELIQUIS  5 MG TABS tablet [id number]  TAKE 1 TABLET BY MOUTH TWICE A DAY [name], FNP  Active Self  enalapril  (VASOTEC ) 2.5 MG tablet [id number]  TAKE 1 TABLET BY MOUTH EVERY DAY [name], FNP  Active Self  esomeprazole (NEXIUM) 40 MG capsule [id number]  TAKE 1 CAPSULE BY MOUTH EVERY DAY IN THE MORNING [name], FNP  Active   fluticasone  (FLONASE ) 50 MCG/ACT nasal spray [id number]  Place 1 spray into both nostrils daily. Jens Durand, MD  Active   furosemide  (LASIX ) 40 MG tablet [id number]  Take 0.5 tablets (20 mg total) by mouth daily.  Patient taking differently: Take 20 mg by mouth daily as needed.   Jens Durand, MD  Active Self  gabapentin  (NEURONTIN ) 300 MG capsule [id number]  Take 300 mg by mouth daily. [provider]  Active Self  ipratropium-albuterol  (DUONEB) 0.5-2.5 (3) MG/3ML SOLN [id number]  Take 3 mLs by nebulization every 6 (six) hours as needed. [name], FNP  Active Self  levofloxacin  (LEVAQUIN ) 750 MG tablet [id number]  Take 1 tablet (750 mg total) by mouth daily.  Patient not taking: Reported on 11/30/2023   [name], FNP  Active   lidocaine  (XYLOCAINE ) 2 % solution [id number]  GARGLE THEN SWALLOW 15 ML 5 TIMES DAILY AS NEEDED FOR SORE THROAT. [name], FNP  Active   meloxicam (MOBIC) 15 MG tablet [id number]  Take 15 mg by mouth daily. [provider]  Active Self  metoprolol  succinate (TOPROL -XL) 25 MG 24 hr tablet [id number]  TAKE 1 TABLET (25 MG TOTAL) BY MOUTH DAILY. [name], FNP  Active Self  nystatin  powder [id number]  Apply 1 Application topically 3 (three) times daily as needed. Irritation under breast and groin area [name], FNP  Active Self  TALTZ 80 MG/ML SOAJ [id number]  Inject 80 mg into the skin every 28 (twenty-eight) days. [provider]  Active Self  TRELEGY ELLIPTA 100-62.5-25 MCG/ACT AEPB [id number]  Inhale 1 puff  into the lungs daily. [provider]  Active Self  Vibegron  (GEMTESA ) 75 MG TABS [id number]  Take 1 tablet (75 mg total) by mouth daily. [name], FNP  Active Self            Recommendation:   PCP Follow-up Continue Current Plan of Care  Follow Up Plan:   Telephone follow-up in 1 week  Medford Balboa, BSN, RN [place]  VBCI - [facility] RN Care Manager [phone]

## 2023-12-15 ENCOUNTER — Other Ambulatory Visit: Payer: Self-pay

## 2023-12-15 MED ORDER — FLUTICASONE PROPIONATE 50 MCG/ACT NA SUSP: 1.0000 | Freq: Every day | NASAL | Status: AC

## 2023-12-15 NOTE — Progress Notes (Signed)
 Sent rx fluticasone  to pharmacy

## 2023-12-18 MED FILL — TALTZ AUTOINJECTOR 80 MG/ML SUBCUTANEOUS: SUBCUTANEOUS | 28 days supply | Qty: 1 | Fill #2

## 2023-12-20 ENCOUNTER — Encounter: Payer: Self-pay | Admitting: Family

## 2023-12-20 ENCOUNTER — Ambulatory Visit: Admitting: Family

## 2023-12-20 ENCOUNTER — Ambulatory Visit (INDEPENDENT_AMBULATORY_CARE_PROVIDER_SITE_OTHER): Admitting: Family

## 2023-12-20 VITALS — HR 68 | Wt 309.8 lb

## 2023-12-20 DIAGNOSIS — E559 Vitamin D deficiency, unspecified: Secondary | ICD-10-CM | POA: Diagnosis not present

## 2023-12-20 DIAGNOSIS — E782 Mixed hyperlipidemia: Secondary | ICD-10-CM | POA: Diagnosis not present

## 2023-12-20 DIAGNOSIS — R5383 Other fatigue: Secondary | ICD-10-CM | POA: Diagnosis not present

## 2023-12-20 DIAGNOSIS — R7303 Prediabetes: Secondary | ICD-10-CM

## 2023-12-20 DIAGNOSIS — E538 Deficiency of other specified B group vitamins: Secondary | ICD-10-CM | POA: Diagnosis not present

## 2023-12-20 DIAGNOSIS — G4733 Obstructive sleep apnea (adult) (pediatric): Secondary | ICD-10-CM | POA: Diagnosis not present

## 2023-12-20 DIAGNOSIS — E039 Hypothyroidism, unspecified: Secondary | ICD-10-CM

## 2023-12-20 DIAGNOSIS — J449 Chronic obstructive pulmonary disease, unspecified: Secondary | ICD-10-CM | POA: Diagnosis not present

## 2023-12-20 NOTE — Progress Notes (Unsigned)
 Established Patient Office Visit  Subjective   Patient ID: Patricia Walker, female    DOB: [birth year]  Age: 78 y.o. MRN: [id number]  Chief Complaint  Patient presents with   Follow-up    54mo, Sleep study for new CPAP    Patient is here to follow up and discuss sleep study to get a new CPAP machine. She was accompanied by her grandson. She reports that she is still experiencing frequent shortness of breath and more mucous production and uses her Trelegy inhaler daily as well as her Albuterol  up to 3 times /a day. Patient was recently treated for bronchospasm in ED 11/05/23 on and treated with antibiotics for pneumonia on 11/14/23. Will send in alternative to Trelegy that can be used with her nebulizer to enhance therapeutic effect for the short term. Patient also endorses more fatigue and unrestful sleep for extendedperiods than usual and wonders if we can give her anything to help with her energy levels. Will order routine fasting labs and vitamin B12. Patient denies any chest pain, palpitations, abdominal pain, nausea, vomiting/diarrhea at this time.   Patient is worried that her memory is declining as she is struggling to recall names and remember things she got up to go do or conversations she recently has had. 6CIT today was 2/28 as she forgot 1 month recognition.  Patient had previously been given Mounjaro  samples for OSA and Obesity which was not covered by her insurance. Provided patient with another box of samples.    {History (Optional):23778}  Review of Systems  Constitutional:  Positive for malaise/fatigue.  HENT: Negative.    Eyes: Negative.   Respiratory:  Positive for sputum production and shortness of breath.   Cardiovascular: Negative.   Gastrointestinal: Negative.   Genitourinary: Negative.   Musculoskeletal: Negative.   Neurological:  Positive for weakness (walks with cane for balance support).  Endo/Heme/Allergies: Negative.   Psychiatric/Behavioral: Negative.         Objective:     Pulse 68   Wt (!) 309 lb 12.8 oz (140.5 kg)   SpO2 97%   BMI 53.18 kg/m  {Vitals History (Optional):23777}  Physical Exam Vitals reviewed.  Constitutional:      Appearance: Normal appearance. She is obese.  HENT:     Head: Normocephalic and atraumatic.     Right Ear: External ear normal.     Left Ear: External ear normal.     Nose: Nose normal.     Mouth/Throat:     Mouth: Mucous membranes are moist.  Eyes:     Extraocular Movements: Extraocular movements intact.     Pupils: Pupils are equal, round, and reactive to light.  Cardiovascular:     Rate and Rhythm: Normal rate and regular rhythm.     Pulses: Normal pulses.     Heart sounds: Normal heart sounds.  Pulmonary:     Effort: Pulmonary effort is normal.     Breath sounds: Normal breath sounds.  Abdominal:     General: Abdomen is flat.     Palpations: Abdomen is soft.  Musculoskeletal:        General: Normal range of motion.     Cervical back: Normal range of motion and neck supple.  Skin:    General: Skin is warm and dry.  Neurological:     General: No focal deficit present.     Mental Status: She is alert.  Psychiatric:        Mood and Affect: Mood normal.  Behavior: Behavior normal.        Thought Content: Thought content normal.        Judgment: Judgment normal.      No results found for any visits on 12/20/23.  {Labs (Optional):23779}  The ASCVD Risk score (Arnett DK, et al., 2019) failed to calculate for the following reasons:   Risk score cannot be calculated because patient has a medical history suggesting prior/existing ASCVD    Assessment & Plan:  Continue medications as prescribed. Start once weekly Mounjaro  2.5 mg injections. Ordered fasting labs, patient will return for those. Start taking Yupelri nebulizer and stop taking Trelegy inhaler. Problem List Items Addressed This Visit       Respiratory   Chronic obstructive pulmonary disease (HCC)   Relevant  Orders   CMP14+EGFR   CBC with Diff     Endocrine   Hypothyroidism (acquired)   Relevant Orders   CMP14+EGFR   CBC with Diff   TSH+T4F+T3Free     Other   Hyperlipidemia, unspecified   Relevant Orders   CMP14+EGFR   Lipid panel   CBC with Diff   Obesity, morbid (HCC)   Relevant Orders   CMP14+EGFR   CBC with Diff   Vitamin D  deficiency   Relevant Orders   CMP14+EGFR   VITAMIN D  25 Hydroxy (Vit-D Deficiency, Fractures)   CBC with Diff   Other fatigue   Relevant Orders   CMP14+EGFR   Iron, TIBC and Ferritin Panel   CBC with Diff   TSH+T4F+T3Free   Prediabetes   Relevant Orders   CMP14+EGFR   CBC with Diff   Hemoglobin A1c   B12 deficiency due to diet   Relevant Orders   CMP14+EGFR   Vitamin B12   CBC with Diff    No follow-ups on file.    Patricia DELENA Cain, FNP

## 2023-12-21 ENCOUNTER — Other Ambulatory Visit

## 2023-12-21 NOTE — Patient Instructions (Signed)
 Visit Information  Thank you for taking time to visit with me today. Please don't hesitate to contact me if I can be of assistance to you before our next scheduled telephone appointment.  Our next appointment is by telephone on Thursday September 18th at 11:00am  Following is a copy of your care plan:   Goals Addressed             This Visit's Progress    VBCI Transitions of Care (TOC) Care Plan       Problems: (reviewed 12/21/23) Recent Hospitalization for treatment of COPD  Goal: (reviewed 12/21/23) Over the next 30 days, the patient will not experience hospital readmission  Interventions: (reviewed 12/21/23)  COPD Interventions: Advised patient to track and manage COPD triggers Assessed social determinant of health barriers Discussed the importance of adequate rest and management of fatigue with COPD Provided education about and advised patient to utilize infection prevention strategies to reduce risk of respiratory infection Provided instruction about proper use of medications used for management of COPD including inhalers Screening for signs and symptoms of depression related to chronic disease state  Use CPAP at night  - 8/15 - The patient would like a new machine because hers is old. She has an appointment with Pulmonary Dr. Theotis 8/19 11/30/23 - Dr. Theotis did not order a sleep study or CPAP. He is leaving that up to the PCP. The patient had her lung cancer screening completed 11/29/23 Levoflaxcin for 7 days - Completed 8/15 Continue use of Albuterol , Trelegy and Duonebs for SOB 12/14/23 - The patient states she does not feel well and is continuing to cough and has the sniffles. Needs a refill on Flonase . Message sent to the provider 12/21/23 - The PCP stopped her Trelegy and started her on Yupelri nebulizer which the pharmacy does not have and will have to order.  12/21/23 - Bilateral LE edema. The patient took Furosemide  20mg  without much result  Patient Self Care  Activities: (reviewed 12/21/23) Attend all scheduled provider appointments Call pharmacy for medication refills 3-7 days in advance of running out of medications Call provider office for new concerns or questions  Notify RN Care Manager of [facility] call rescheduling needs Participate in Transition of Care Program/Attend [facility] scheduled calls Perform all self care activities independently  Take medications as prescribed   11/17/23 - The patient states she has some boils on her buttocks and her hemorrhoids are bothering her. - Cleared 12/21/23  Plan:  Telephone follow up appointment with care management team member scheduled for:  Thursday September 17that 11:00am        Patient verbalizes understanding of instructions and care plan provided today and agrees to view in MyChart. Active MyChart status and patient understanding of how to access instructions and care plan via MyChart confirmed with patient.     The patient has been provided with contact information for the care management team and has been advised to call with any health related questions or concerns.   Please call the care guide team at [phone] if you need to cancel or reschedule your appointment.   Please call the Suicide and Crisis Lifeline: 988 call the [place]  National Suicide Prevention Lifeline: (507) 723-1084 or TTY: (334)236-4073 TTY 612-322-9153) to talk to a trained counselor if you are experiencing a Mental Health or Behavioral Health Crisis or need someone to talk to.  Medford Balboa, BSN, RN [place]  VBCI - [employer] [facility] RN Care Manager [phone]

## 2023-12-21 NOTE — Transitions of Care (Post Inpatient/ED Visit) (Signed)
 Transition of Care week 5  Visit Note  12/21/2023  Name: Patricia Walker MRN: [id number]          DOB: Apr 11, 1946  Situation: Patient enrolled in [facility] 30-day program. Visit completed with Tilton Giannetti by telephone.   Background:   Past Medical History:  Diagnosis Date   Anxiety    Arthritis    Asthma    COPD exacerbation (HCC) 10/08/2020   Coronary artery disease    mild, nonobstructive   Depression    Dyspnea    on exertion   Dysrhythmia    Atrial Fibrillation   GERD (gastroesophageal reflux disease)    Heart murmur    Hyperlipidemia    Hypertension    Hypothyroidism    MI, old 2016   nonobstructive CAD by Cath   Morbid obesity (HCC)    Persistent atrial fibrillation (HCC)    Pneumonia 06/2018   and RSV   Psoriasis    Sleep apnea    compliant with CPAP   Thyroid  disease     Assessment: Patient Reported Symptoms: Cognitive Cognitive Status: Alert and oriented to person, place, and time      Neurological Neurological Review of Symptoms: Other: [place] Neurological Symptoms/Conditions [RPT]: Neuropathy Neurological Management Strategies: Medication therapy, Coping strategies, Routine screening  HEENT HEENT Symptoms Reported: No symptoms reported      Cardiovascular Cardiovascular Symptoms Reported: Fainting Does patient have uncontrolled Hypertension?: No Cardiovascular Management Strategies: Medication therapy, Routine screening, Activity Cardiovascular Comment: The patient states she has 3-4+ edema to her feet. She took a 20mg  Furosemide  but she has not been voicing that much. Message sent to the provider  Respiratory Respiratory Symptoms Reported: Shortness of breath, Dry cough Additional Respiratory Details: The patient did see her provider and she has a new nebulizer prescribed. Trelegy has been discontinued. Lab work to be completed 12/22/23. The patient states she is going to take an additional 20mg  of Furosemide  to see if that will help with the  edema Respiratory Management Strategies: Adequate rest, Medication therapy, Fluid modification, Routine screening, Coping strategies  Endocrine Endocrine Symptoms Reported: No symptoms reported Is patient diabetic?: No    Gastrointestinal Gastrointestinal Symptoms Reported: No symptoms reported Gastrointestinal Management Strategies: Coping strategies    Genitourinary Genitourinary Symptoms Reported: No symptoms reported    Integumentary Integumentary Symptoms Reported: No symptoms reported    Musculoskeletal Musculoskelatal Symptoms Reviewed: Back pain, Joint pain Additional Musculoskeletal Details: Chronic Shoulder pain Musculoskeletal Management Strategies: Medication therapy, Routine screening, Coping strategies, Adequate rest      Psychosocial Psychosocial Symptoms Reported: No symptoms reported         There were no vitals filed for this visit.  Medications Reviewed Today     Reviewed by Moises Reusing, RN (Case Manager) on 12/21/23 at 1114  Med List Status: <None>   Medication Order Taking? Sig Documenting Provider Last Dose Status Informant  acetaminophen  (TYLENOL ) 325 MG tablet [id number]  Take 2 tablets (650 mg total) by mouth every 6 (six) hours as needed for mild pain (pain score 1-3) or moderate pain (pain score 4-6). Jens Durand, MD  Active Self  albuterol  [facility]) 108 [id number] Base) MCG/ACT inhaler [id number]  INHALE 1-2 PUFFS EVERY 4-6 HOURS AS NEEDED FOR SHORTNESS OF BREATH [name], FNP  Active Self  atorvastatin  (LIPITOR) 40 MG tablet [id number]  ONCE A DAY FOR CHOLESTEROL [name], FNP  Active Self           Med Note (THOMAS, CONNELLAE H  Sun Nov 05, 2023  5:08 PM)    chlorpheniramine-HYDROcodone  (TUSSIONEX) 10-8 MG/5ML [id number]  Take 5 mLs by mouth every 12 (twelve) hours as needed for cough. [name], FNP  Active Self  ELIQUIS  5 MG TABS tablet [id number]  TAKE 1 TABLET BY MOUTH TWICE A DAY [name], FNP  Active Self   enalapril  (VASOTEC ) 2.5 MG tablet [id number]  TAKE 1 TABLET BY MOUTH EVERY DAY [name], FNP  Active Self  esomeprazole (NEXIUM) 40 MG capsule [id number]  TAKE 1 CAPSULE BY MOUTH EVERY DAY IN THE MORNING [name], FNP  Active   fluticasone  (FLONASE ) 50 MCG/ACT nasal spray [id number]  Place 1 spray into both nostrils daily. [name], FNP  Active   furosemide  (LASIX ) 40 MG tablet [id number]  Take 0.5 tablets (20 mg total) by mouth daily. Jens Durand, MD  Active Self  gabapentin  (NEURONTIN ) 300 MG capsule [id number]  Take 300 mg by mouth daily. [provider]  Active Self  ipratropium-albuterol  (DUONEB) 0.5-2.5 (3) MG/3ML SOLN [id number]  Take 3 mLs by nebulization every 6 (six) hours as needed. [name], FNP  Active Self  levofloxacin  (LEVAQUIN ) 750 MG tablet [id number]  Take 1 tablet (750 mg total) by mouth daily.  Patient not taking: Reported on 12/20/2023   [name], FNP  Active   lidocaine  (XYLOCAINE ) 2 % solution [id number]  GARGLE THEN SWALLOW 15 ML 5 TIMES DAILY AS NEEDED FOR SORE THROAT.  Patient not taking: Reported on 12/20/2023   [name], FNP  Active   meloxicam [facility]) 15 MG tablet [id number]  Take 15 mg by mouth daily. [provider]  Active Self  metoprolol  succinate (TOPROL -XL) 25 MG 24 hr tablet [id number]  TAKE 1 TABLET (25 MG TOTAL) BY MOUTH DAILY. [name], FNP  Active Self  nystatin  powder [id number]  Apply 1 Application topically 3 (three) times daily as needed. Irritation under breast and groin area [name], FNP  Active Self  TALTZ 80 MG/ML SOAJ [id number]  Inject 80 mg into the skin every 28 (twenty-eight) days. [provider]  Active Self  tirzepatide  (MOUNJARO ) 2.5 MG/0.5ML Pen [id number] Yes Inject 2.5 mg into the skin once a week. [provider]  Active   TRELEGY ELLIPTA 100-62.5-25 MCG/ACT AEPB [id number]  Inhale 1 puff into the lungs daily.  Patient not taking: Reported  on 12/21/2023   [provider]  Active Self  Vibegron  (GEMTESA ) 75 MG TABS [id number]  Take 1 tablet (75 mg total) by mouth daily. [name], FNP  Active Self            Recommendation:   Continue Current Plan of Care  Follow Up Plan:   Telephone follow-up in 1 week Discharge from [facility] Program. Transfer to CCM  [facility], BSN, RN [place]  VBCI - [facility] RN Care Manager [phone]

## 2023-12-22 ENCOUNTER — Encounter: Payer: Self-pay | Admitting: Cardiology

## 2023-12-22 ENCOUNTER — Ambulatory Visit (INDEPENDENT_AMBULATORY_CARE_PROVIDER_SITE_OTHER): Admitting: Cardiology

## 2023-12-22 ENCOUNTER — Encounter: Payer: Self-pay | Admitting: Family

## 2023-12-22 VITALS — BP 128/76 | HR 79 | Ht 64.0 in | Wt 303.4 lb

## 2023-12-22 DIAGNOSIS — J449 Chronic obstructive pulmonary disease, unspecified: Secondary | ICD-10-CM | POA: Diagnosis not present

## 2023-12-22 DIAGNOSIS — I5033 Acute on chronic diastolic (congestive) heart failure: Secondary | ICD-10-CM | POA: Diagnosis not present

## 2023-12-22 DIAGNOSIS — E538 Deficiency of other specified B group vitamins: Secondary | ICD-10-CM | POA: Diagnosis not present

## 2023-12-22 DIAGNOSIS — M7989 Other specified soft tissue disorders: Secondary | ICD-10-CM

## 2023-12-22 DIAGNOSIS — E782 Mixed hyperlipidemia: Secondary | ICD-10-CM | POA: Diagnosis not present

## 2023-12-22 DIAGNOSIS — R7303 Prediabetes: Secondary | ICD-10-CM | POA: Diagnosis not present

## 2023-12-22 MED ORDER — METOLAZONE 2.5 MG PO TABS: 2.5000 mg | ORAL_TABLET | Freq: Every day | ORAL | 0 refills | Status: DC

## 2023-12-22 NOTE — Progress Notes (Signed)
 Cardiology Office Note   Date:  12/22/2023   ID:  Patricia Walker, DOB [birth year], MRN [id number]  PCP:  [name], FNP  Cardiologist:  Jeoffrey Pollen, NP      History of Present Illness: Patricia Walker is a 78 y.o. female who presents for  Chief Complaint  Patient presents with   Acute Visit    Swelling in legs and feet has elevated legs although it doesn't seem to be helping is also taking a prescribed fluid pill. Swelling started on Wednesday. Legs are painful to touch. Also having some difficulties breathing. Patricia Walker.    Patient in office for an acute visit, complaining of worsening lower extremity edema. Patient states she has been taking furosemide  20 mg daily, doesn't feel like it is working any more.       Past Medical History:  Diagnosis Date   Anxiety    Arthritis    Asthma    COPD exacerbation (HCC) 10/08/2020   Coronary artery disease    mild, nonobstructive   Depression    Dyspnea    on exertion   Dysrhythmia    Atrial Fibrillation   GERD (gastroesophageal reflux disease)    Heart murmur    Hyperlipidemia    Hypertension    Hypothyroidism    MI, old 2016   nonobstructive CAD by Cath   Morbid obesity (HCC)    Persistent atrial fibrillation (HCC)    Pneumonia 06/2018   and RSV   Psoriasis    Sleep apnea    compliant with CPAP   Thyroid  disease      Past Surgical History:  Procedure Laterality Date   ARTERY BIOPSY Right 07/21/2017   Procedure: BIOPSY TEMPORAL ARTERY;  Surgeon: Jama Cordella MATSU, MD;  Location: [facility];  Service: Vascular;  Laterality: Right;   CARDIAC CATHETERIZATION     CARDIOVERSION Right 09/01/2016   Procedure: Cardioversion;  Surgeon: Fernand Denyse LABOR, MD;  Location: [facility];  Service: Cardiovascular;  Laterality: Right;   CARDIOVERSION N/A 09/09/2016   Procedure: Cardioversion;  Surgeon: Fernand Denyse LABOR, MD;  Location: [facility];  Service: Cardiovascular;  Laterality: N/A;   CATARACT EXTRACTION W/PHACO Right  03/26/2019   Procedure: CATARACT EXTRACTION PHACO AND INTRAOCULAR LENS PLACEMENT (IOC) RIGHT 6.45, 00:39.9;  Surgeon: Jaye Fallow, MD;  Location: [facility];  Service: Ophthalmology;  Laterality: Right;   CATARACT EXTRACTION W/PHACO Left 04/16/2019   Procedure: CATARACT EXTRACTION PHACO AND INTRAOCULAR LENS PLACEMENT (IOC) LEFT;   3.14, 00:24.9;  Surgeon: Jaye Fallow, MD;  Location: [facility];  Service: Ophthalmology;  Laterality: Left;  sleep apnea-CPAP   COLONOSCOPY WITH PROPOFOL  N/A 08/28/2019   Procedure: COLONOSCOPY WITH PROPOFOL ;  Surgeon: Toledo, Ladell POUR, MD;  Location: [facility];  Service: Gastroenterology;  Laterality: N/A;   ELECTROPHYSIOLOGIC STUDY N/A 02/01/2016   Procedure: CARDIOVERSION;  Surgeon: Denyse LABOR Fernand, MD;  Location: [facility];  Service: Cardiovascular;  Laterality: N/A;   ESOPHAGEAL DILATION     ESOPHAGOGASTRODUODENOSCOPY (EGD) WITH PROPOFOL  N/A 02/06/2017   Procedure: ESOPHAGOGASTRODUODENOSCOPY (EGD) WITH PROPOFOL ;  Surgeon: Therisa Bi, MD;  Location: [facility];  Service: Gastroenterology;  Laterality: N/A;   ESOPHAGOGASTRODUODENOSCOPY (EGD) WITH PROPOFOL  N/A 08/28/2019   Procedure: ESOPHAGOGASTRODUODENOSCOPY (EGD) WITH PROPOFOL ;  Surgeon: Toledo, Ladell POUR, MD;  Location: [facility];  Service: Gastroenterology;  Laterality: N/A;   EUS N/A 02/16/2017   Procedure: FULL UPPER ENDOSCOPIC ULTRASOUND (EUS) RADIAL;  Surgeon: Elta Fonda SQUIBB, MD;  Location: [facility];  Service: Gastroenterology;  Laterality: N/A;   LEFT HEART CATH AND CORONARY ANGIOGRAPHY N/A 09/08/2016   Procedure: Left Heart Cath and Coronary Angiography;  Surgeon: Fernand Denyse LABOR, MD;  Location: [facility];  Service: Cardiovascular;  Laterality: N/A;   US  ECHOCARDIOGRAPHY       Current Outpatient Medications  Medication Sig Dispense Refill   acetaminophen  (TYLENOL ) 325 MG tablet Take 2 tablets (650 mg total) by mouth every 6 (six) hours as needed  for mild pain (pain score 1-3) or moderate pain (pain score 4-6).     albuterol  (VENTOLIN  HFA) 108 (90 Base) MCG/ACT inhaler INHALE 1-2 PUFFS EVERY 4-6 HOURS AS NEEDED FOR SHORTNESS OF BREATH 18 each 6   atorvastatin  (LIPITOR) 40 MG tablet ONCE A DAY FOR CHOLESTEROL 90 tablet 3   chlorpheniramine-HYDROcodone  (TUSSIONEX) 10-8 MG/5ML Take 5 mLs by mouth every 12 (twelve) hours as needed for cough. 115 mL 0   ELIQUIS  5 MG TABS tablet TAKE 1 TABLET BY MOUTH TWICE A DAY 180 tablet 1   enalapril  (VASOTEC ) 2.5 MG tablet TAKE 1 TABLET BY MOUTH EVERY DAY 90 tablet 1   esomeprazole (NEXIUM) 40 MG capsule TAKE 1 CAPSULE BY MOUTH EVERY DAY IN THE MORNING 90 capsule 3   fluticasone  (FLONASE ) 50 MCG/ACT nasal spray Place 1 spray into both nostrils daily.     furosemide  (LASIX ) 40 MG tablet Take 0.5 tablets (20 mg total) by mouth daily. 30 tablet 0   gabapentin  (NEURONTIN ) 300 MG capsule Take 300 mg by mouth daily.     ipratropium-albuterol  (DUONEB) 0.5-2.5 (3) MG/3ML SOLN Take 3 mLs by nebulization every 6 (six) hours as needed. 120 mL 2   meloxicam (MOBIC) 15 MG tablet Take 15 mg by mouth daily.     metolazone  (ZAROXOLYN ) 2.5 MG tablet Take 1 tablet (2.5 mg total) by mouth daily for 3 days. 3 tablet 0   metoprolol  succinate (TOPROL -XL) 25 MG 24 hr tablet TAKE 1 TABLET (25 MG TOTAL) BY MOUTH DAILY. 90 tablet 1   nystatin  powder Apply 1 Application topically 3 (three) times daily as needed. Irritation under breast and groin area 60 g 1   TALTZ 80 MG/ML SOAJ Inject 80 mg into the skin every 28 (twenty-eight) days.     tirzepatide  (MOUNJARO ) 2.5 MG/0.5ML Pen Inject 2.5 mg into the skin once a week.     Vibegron  (GEMTESA ) 75 MG TABS Take 1 tablet (75 mg total) by mouth daily. 30 tablet 5   levofloxacin  (LEVAQUIN ) 750 MG tablet Take 1 tablet (750 mg total) by mouth daily. (Patient not taking: Reported on 12/22/2023) 7 tablet 0   lidocaine  (XYLOCAINE ) 2 % solution GARGLE THEN SWALLOW 15 ML 5 TIMES DAILY AS NEEDED FOR  SORE THROAT. (Patient not taking: Reported on 12/22/2023) 400 mL 1   TRELEGY ELLIPTA 100-62.5-25 MCG/ACT AEPB Inhale 1 puff into the lungs daily. (Patient not taking: Reported on 12/22/2023)     No current facility-administered medications for this visit.    Allergies:   Codeine and Other    Social History:   reports that she quit smoking about 10 years ago. Her smoking use included cigarettes. She has never used smokeless tobacco. She reports that she does not drink alcohol and does not use drugs.   Family History:  family history includes Breast cancer in her paternal aunt; Heart attack in her father; Other in her father.    ROS:     Review of Systems  Constitutional: Negative.   HENT: Negative.    Eyes:  Negative.   Respiratory: Negative.  Negative for shortness of breath.   Cardiovascular:  Positive for leg swelling. Negative for chest pain.  Gastrointestinal: Negative.  Negative for abdominal pain, constipation and diarrhea.  Genitourinary: Negative.   Musculoskeletal:  Negative for joint pain and myalgias.  Skin: Negative.   Neurological: Negative.  Negative for dizziness and headaches.  Endo/Heme/Allergies: Negative.   All other systems reviewed and are negative.     All other systems are reviewed and negative.    PHYSICAL EXAM: VS:  BP 128/76   Pulse 79   Ht 5' 4 (1.626 m)   Wt (!) 303 lb 6.4 oz (137.6 kg)   SpO2 95%   BMI 52.08 kg/m  , BMI Body mass index is 52.08 kg/m. Last weight:  Wt Readings from Last 3 Encounters:  12/22/23 (!) 303 lb 6.4 oz (137.6 kg)  12/20/23 (!) 309 lb 12.8 oz (140.5 kg)  11/30/23 (!) 302 lb (137 kg)     Physical Exam Vitals and nursing note reviewed.  Constitutional:      Appearance: Normal appearance. She is normal weight.  HENT:     Head: Normocephalic and atraumatic.     Nose: Nose normal.     Mouth/Throat:     Mouth: Mucous membranes are moist.     Pharynx: Oropharynx is clear.  Eyes:     Conjunctiva/sclera:  Conjunctivae normal.     Pupils: Pupils are equal, round, and reactive to light.  Cardiovascular:     Rate and Rhythm: Normal rate and regular rhythm.     Pulses: Normal pulses.     Heart sounds: Normal heart sounds.  Pulmonary:     Effort: Pulmonary effort is normal.     Breath sounds: Normal breath sounds.  Abdominal:     General: Abdomen is flat. Bowel sounds are normal.     Palpations: Abdomen is soft.  Musculoskeletal:        General: Normal range of motion.     Cervical back: Normal range of motion.  Skin:    General: Skin is warm and dry.  Neurological:     General: No focal deficit present.     Mental Status: She is alert and oriented to person, place, and time. Mental status is at baseline.  Psychiatric:        Mood and Affect: Mood normal.        Behavior: Behavior normal.     Recent Labs: 08/17/2023: ALT 39; TSH 2.060 11/05/2023: B Natriuretic Peptide 241.8; Magnesium  1.9 11/08/2023: BUN 30; Creatinine, Ser 1.17; Hemoglobin 13.7; Platelets 375; Potassium 4.5; Sodium 134    Lipid Panel    Component Value Date/Time   CHOL 111 08/17/2023 1044   CHOL 190 05/18/2012 0928   TRIG 176 (H) 08/17/2023 1044   TRIG 138 05/18/2012 0928   HDL 32 (L) 08/17/2023 1044   HDL 35 (L) 05/18/2012 0928   CHOLHDL 3.5 08/17/2023 1044   VLDL 28 05/18/2012 0928   LDLCALC 50 08/17/2023 1044   LDLCALC 127 (H) 05/18/2012 0928      ASSESSMENT AND PLAN:    ICD-10-CM   1. Acute on chronic heart failure with preserved ejection fraction (HFpEF) (HCC)  I50.33     2. Leg swelling  M79.89 PCV ECHOCARDIOGRAM COMPLETE       Problem List Items Addressed This Visit       Cardiovascular and Mediastinum   Acute on chronic heart failure with preserved ejection fraction (HFpEF) (HCC) - Primary  Echo 02/2023 EF 66%, normal diastolic function. Patient now complaining of worsening lower extremity edema not responding to furosemide  20 mg daily. Will add metolazone  2.5 mg for three days only. Will  repeat echo to check heart function. Patient getting blood work today for PCP.       Relevant Medications   metolazone  (ZAROXOLYN ) 2.5 MG tablet     Other   Leg swelling   Relevant Orders   PCV ECHOCARDIOGRAM COMPLETE     Disposition:   Return in about 2 weeks (around 01/05/2024) for with SK, after echo.    Total time spent: 25 minutes  Signed,  Google, NP  12/22/2023 2:06 PM    [facility]

## 2023-12-22 NOTE — Assessment & Plan Note (Signed)
 Echo 02/2023 EF 66%, normal diastolic function. Patient now complaining of worsening lower extremity edema not responding to furosemide  20 mg daily. Will add metolazone  2.5 mg for three days only. Will repeat echo to check heart function. Patient getting blood work today for PCP.

## 2023-12-23 LAB — CMP14+EGFR
ALT: 13 IU/L (ref 0–32)
AST: 21 IU/L (ref 0–40)
Albumin: 4.1 g/dL (ref 3.8–4.8)
Alkaline Phosphatase: 87 IU/L (ref 44–121)
BUN/Creatinine Ratio: 13 (ref 12–28)
BUN: 14 mg/dL (ref 8–27)
Bilirubin Total: 0.9 mg/dL (ref 0.0–1.2)
CO2: 25 mmol/L (ref 20–29)
Calcium: 9 mg/dL (ref 8.7–10.3)
Chloride: 100 mmol/L (ref 96–106)
Creatinine, Ser: 1.06 mg/dL — ABNORMAL HIGH (ref 0.57–1.00)
Globulin, Total: 2.3 g/dL (ref 1.5–4.5)
Glucose: 88 mg/dL (ref 70–99)
Potassium: 4 mmol/L (ref 3.5–5.2)
Sodium: 141 mmol/L (ref 134–144)
Total Protein: 6.4 g/dL (ref 6.0–8.5)
eGFR: 54 mL/min/1.73 — ABNORMAL LOW (ref >59)

## 2023-12-23 LAB — CBC WITH DIFFERENTIAL/PLATELET
Basophils Absolute: 0.1 x10E3/uL (ref 0.0–0.2)
Basos: 1 %
EOS (ABSOLUTE): 0.1 x10E3/uL (ref 0.0–0.4)
Eos: 2 %
Hematocrit: 43 % (ref 34.0–46.6)
Hemoglobin: 14.1 g/dL (ref 11.1–15.9)
Immature Grans (Abs): 0 x10E3/uL (ref 0.0–0.1)
Immature Granulocytes: 0 %
Lymphocytes Absolute: 1.3 x10E3/uL (ref 0.7–3.1)
Lymphs: 22 %
MCH: 28.5 pg (ref 26.6–33.0)
MCHC: 32.8 g/dL (ref 31.5–35.7)
MCV: 87 fL (ref 79–97)
Monocytes Absolute: 0.5 x10E3/uL (ref 0.1–0.9)
Monocytes: 9 %
Neutrophils Absolute: 3.7 x10E3/uL (ref 1.4–7.0)
Neutrophils: 65 %
Platelets: 298 x10E3/uL (ref 150–450)
RBC: 4.95 x10E6/uL (ref 3.77–5.28)
RDW: 14.2 % (ref 11.7–15.4)
WBC: 5.7 x10E3/uL (ref 3.4–10.8)

## 2023-12-23 LAB — LIPID PANEL
Chol/HDL Ratio: 5.4 ratio — ABNORMAL HIGH (ref 0.0–4.4)
Cholesterol, Total: 209 mg/dL — ABNORMAL HIGH (ref 100–199)
HDL: 39 mg/dL — ABNORMAL LOW (ref >39)
LDL Chol Calc (NIH): 149 mg/dL — ABNORMAL HIGH (ref 0–99)
Triglycerides: 118 mg/dL (ref 0–149)
VLDL Cholesterol Cal: 21 mg/dL (ref 5–40)

## 2023-12-23 LAB — VITAMIN D 25 HYDROXY (VIT D DEFICIENCY, FRACTURES): Vit D, 25-Hydroxy: 18.5 ng/mL — ABNORMAL LOW (ref 30.0–100.0)

## 2023-12-23 LAB — TSH+T4F+T3FREE
Free T4: 1.07 ng/dL (ref 0.82–1.77)
T3, Free: 3 pg/mL (ref 2.0–4.4)
TSH: 2.07 u[IU]/mL (ref 0.450–4.500)

## 2023-12-23 LAB — VITAMIN B12: Vitamin B-12: 522 pg/mL (ref 232–1245)

## 2023-12-23 LAB — IRON,TIBC AND FERRITIN PANEL
Ferritin: 93 ng/mL (ref 15–150)
Iron Saturation: 16 % (ref 15–55)
Iron: 53 ug/dL (ref 27–139)
Total Iron Binding Capacity: 336 ug/dL (ref 250–450)
UIBC: 283 ug/dL (ref 118–369)

## 2023-12-23 LAB — HEMOGLOBIN A1C
Est. average glucose Bld gHb Est-mCnc: 126 mg/dL
Hgb A1c MFr Bld: 6 % — ABNORMAL HIGH (ref 4.8–5.6)

## 2023-12-25 ENCOUNTER — Telehealth: Payer: Self-pay

## 2023-12-25 NOTE — Telephone Encounter (Signed)
 Patient called asking for something to be called in for her states she has cramps in her legs, ankles, knees and shoulders please advise

## 2023-12-27 LAB — SPECIMEN STATUS REPORT

## 2023-12-27 LAB — MAGNESIUM: Magnesium: 1.9 mg/dL (ref 1.6–2.3)

## 2023-12-28 ENCOUNTER — Other Ambulatory Visit: Payer: Self-pay

## 2023-12-28 DIAGNOSIS — J441 Chronic obstructive pulmonary disease with (acute) exacerbation: Secondary | ICD-10-CM

## 2023-12-28 NOTE — Patient Instructions (Signed)
 Visit Information  Thank you for taking time to visit with me today. Please don't hesitate to contact me if I can be of assistance to you before our next scheduled telephone appointment.  Following is a copy of your care plan:   Goals Addressed             This Visit's Progress    COMPLETED: VBCI Transitions of Care (TOC) Care Plan       Problems: (reviewed 12/28/23) Recent Hospitalization for treatment of COPD  Goal: (reviewed 12/28/23) Over the next 30 days, the patient will not experience hospital readmission  Interventions: (reviewed 12/28/23)  COPD Interventions: Advised patient to track and manage COPD triggers Assessed social determinant of health barriers Discussed the importance of adequate rest and management of fatigue with COPD Provided education about and advised patient to utilize infection prevention strategies to reduce risk of respiratory infection Provided instruction about proper use of medications used for management of COPD including inhalers Screening for signs and symptoms of depression related to chronic disease state  Use CPAP at night  - 8/15 - The patient would like a new machine because hers is old. She has an appointment with Pulmonary Dr. Theotis 8/19 11/30/23 - Dr. Theotis did not order a sleep study or CPAP. He is leaving that up to the PCP. The patient had her lung cancer screening completed 11/29/23 Levoflaxcin for 7 days - Completed 8/15 Continue use of Albuterol , Trelegy and Duonebs for SOB 12/14/23 - The patient states she does not feel well and is continuing to cough and has the sniffles. Needs a refill on Flonase . Message sent to the provider 12/21/23 - The PCP stopped her Trelegy and started her on Yupelri nebulizer which the pharmacy does not have and will have to order.  12/21/23 - Bilateral LE edema. The patient took Furosemide  20mg  without much result 12/28/23 - The patient was on Metolazone  2.5mg  for three days with improvement to her BLE edema.   12/28/23 - The patient is encouraged to make an appointment with ENT for drainage and productive cough  Patient Self Care Activities: (reviewed 12/28/23) Attend all scheduled provider appointments Call pharmacy for medication refills 3-7 days in advance of running out of medications Call provider office for new concerns or questions  Notify RN Care Manager of [facility] call rescheduling needs Participate in Transition of Care Program/Attend [facility] scheduled calls Perform all self care activities independently  Take medications as prescribed   11/17/23 - The patient states she has some boils on her buttocks and her hemorrhoids are bothering her. - Cleared 12/21/23  Plan:  Telephone follow up appointment with care management team member scheduled for:  The patient has completed her TOC 30 Day program. Transferred to CCM        Patient verbalizes understanding of instructions and care plan provided today and agrees to view in MyChart. Active MyChart status and patient understanding of how to access instructions and care plan via MyChart confirmed with patient.     The patient has been provided with contact information for the care management team and has been advised to call with any health related questions or concerns.   Please call the care guide team at [phone] if you need to cancel or reschedule your appointment.   Please call the Suicide and Crisis Lifeline: 988 call the [place]  National Suicide Prevention Lifeline: 6301624469 or TTY: 281-086-2997 TTY 484-312-3965) to talk to a trained counselor if you are experiencing a Mental Health or Behavioral Health  Crisis or need someone to talk to.  Medford Balboa, BSN, RN [place]  VBCI - [employer] [facility] RN Care Manager [phone]

## 2023-12-28 NOTE — Transitions of Care (Post Inpatient/ED Visit) (Signed)
 Transition of Care Week 6  Visit Note  12/28/2023  Name: Patricia Walker MRN: [id number]          DOB: [birth year]  Situation: Patient enrolled in [facility] 30-day program. Visit completed with Tilton Mcmahan by telephone.   Background:   Past Medical History:  Diagnosis Date   Anxiety    Arthritis    Asthma    COPD exacerbation (HCC) 10/08/2020   Coronary artery disease    mild, nonobstructive   Depression    Dyspnea    on exertion   Dysrhythmia    Atrial Fibrillation   GERD (gastroesophageal reflux disease)    Heart murmur    Hyperlipidemia    Hypertension    Hypothyroidism    MI, old 2016   nonobstructive CAD by Cath   Morbid obesity (HCC)    Persistent atrial fibrillation (HCC)    Pneumonia 06/2018   and RSV   Psoriasis    Sleep apnea    compliant with CPAP   Thyroid  disease     Assessment: Patient Reported Symptoms: Cognitive Cognitive Status: Alert and oriented to person, place, and time      Neurological Neurological Review of Symptoms: Other: Oher Neurological Symptoms/Conditions [RPT]: Neuropathy Neurological Management Strategies: Medication therapy, Coping strategies, Routine screening  HEENT HEENT Symptoms Reported: Nasal discharge, Ear pain HEENT Management Strategies: Routine screening HEENT Comment: The patient will make an appointment with ENT    Cardiovascular Cardiovascular Symptoms Reported: Swelling in legs or feet Does patient have uncontrolled Hypertension?: No Cardiovascular Management Strategies: Medication therapy, Activity, Routine screening Cardiovascular Comment: The patient went to see the Cardiologist on 12/22/23. She was given Metolazone  2.5mg  for three days and the swelling has improved. She states she has some leg and toe cramping. She [place] need a potassium replacement. Labs are pending.  Respiratory Respiratory Symptoms Reported: Productive cough Additional Respiratory Details: The patient is using her albuterol  inhaler and taking her  Furosemide . She states she has phelgm build up in her throat that she has difficulty getting it out of her chest. No SOB Respiratory Management Strategies: Adequate rest, Medication therapy, Fluid modification, Routine screening, Coping strategies  Endocrine Endocrine Symptoms Reported: No symptoms reported Is patient diabetic?: No    Gastrointestinal Gastrointestinal Symptoms Reported: No symptoms reported      Genitourinary Genitourinary Symptoms Reported: No symptoms reported    Integumentary Integumentary Symptoms Reported: No symptoms reported    Musculoskeletal Musculoskelatal Symptoms Reviewed: Back pain, Joint pain Additional Musculoskeletal Details: Shoulder pain Musculoskeletal Management Strategies: Medication therapy, Routine screening, Coping strategies, Adequate rest      Psychosocial Psychosocial Symptoms Reported: No symptoms reported         There were no vitals filed for this visit.  Medications Reviewed Today     Reviewed by Moises Reusing, RN (Case Manager) on 12/28/23 at 1108  Med List Status: <None>   Medication Order Taking? Sig Documenting Provider Last Dose Status Informant  acetaminophen  (TYLENOL ) 325 MG tablet [id number]  Take 2 tablets (650 mg total) by mouth every 6 (six) hours as needed for mild pain (pain score 1-3) or moderate pain (pain score 4-6). Jens Durand, MD  Active Self  albuterol  (VENTOLIN  HFA) [phone] Base) MCG/ACT inhaler [id number]  INHALE 1-2 PUFFS EVERY 4-6 HOURS AS NEEDED FOR SHORTNESS OF BREATH [name], FNP  Active Self  atorvastatin  (LIPITOR) 40 MG tablet [id number]  ONCE A DAY FOR CHOLESTEROL [name], FNP  Active Self  Med Note CATHY OVAL VEAR [place] Nov 05, 2023  5:08 PM)    chlorpheniramine-HYDROcodone  (TUSSIONEX) 10-8 MG/5ML [id number]  Take 5 mLs by mouth every 12 (twelve) hours as needed for cough. [name], FNP  Active Self  ELIQUIS  5 MG TABS tablet [id number]  TAKE 1 TABLET BY MOUTH  TWICE A DAY [name], FNP  Active Self  enalapril  (VASOTEC ) 2.5 MG tablet [id number]  TAKE 1 TABLET BY MOUTH EVERY DAY [name], FNP  Active Self  esomeprazole (NEXIUM) 40 MG capsule [id number]  TAKE 1 CAPSULE BY MOUTH EVERY DAY IN THE MORNING [name], FNP  Active   fluticasone  (FLONASE ) 50 MCG/ACT nasal spray [id number]  Place 1 spray into both nostrils daily. [name], FNP  Active   furosemide  (LASIX ) 40 MG tablet [id number]  Take 0.5 tablets (20 mg total) by mouth daily. Jens Durand, MD  Active Self  gabapentin  (NEURONTIN ) 300 MG capsule [id number]  Take 300 mg by mouth daily. [provider]  Active Self  ipratropium-albuterol  (DUONEB) 0.5-2.5 (3) MG/3ML SOLN [id number]  Take 3 mLs by nebulization every 6 (six) hours as needed. [name], FNP  Active Self  levofloxacin  (LEVAQUIN ) 750 MG tablet [id number]  Take 1 tablet (750 mg total) by mouth daily.  Patient not taking: Reported on 12/22/2023   [name], FNP  Active   lidocaine  (XYLOCAINE ) 2 % solution [id number]  GARGLE THEN SWALLOW 15 ML 5 TIMES DAILY AS NEEDED FOR SORE THROAT.  Patient not taking: Reported on 12/22/2023   [name], FNP  Active   meloxicam [facility]) 15 MG tablet [id number]  Take 15 mg by mouth daily. [provider]  Active Self  metolazone  (ZAROXOLYN ) 2.5 MG tablet [id number]  Take 1 tablet (2.5 mg total) by mouth daily for 3 days. Scoggins, Amber, NP  Expired 12/25/23 2359   metoprolol  succinate (TOPROL -XL) 25 MG 24 hr tablet [id number]  TAKE 1 TABLET (25 MG TOTAL) BY MOUTH DAILY. [name], FNP  Active Self  nystatin  powder [id number]  Apply 1 Application topically 3 (three) times daily as needed. Irritation under breast and groin area [name], FNP  Active Self  TALTZ 80 MG/ML SOAJ [id number]  Inject 80 mg into the skin every 28 (twenty-eight) days. [provider]  Active Self  tirzepatide  (MOUNJARO ) 2.5 MG/0.5ML Pen [id number]   Inject 2.5 mg into the skin once a week. [provider]  Active   TRELEGY ELLIPTA 100-62.5-25 MCG/ACT AEPB [id number]  Inhale 1 puff into the lungs daily.  Patient not taking: Reported on 12/22/2023   [provider]  Active Self  Vibegron  (GEMTESA ) 75 MG TABS [id number]  Take 1 tablet (75 mg total) by mouth daily. [name], FNP  Active Self            Recommendation:   Referral to: Longitudinal Care  Follow Up Plan:   Closing From:  Transitions of Care Program  [facility], BSN, RN [place]  VBCI - [facility] RN Care Manager [phone]

## 2023-12-29 ENCOUNTER — Other Ambulatory Visit

## 2023-12-29 DIAGNOSIS — I361 Nonrheumatic tricuspid (valve) insufficiency: Secondary | ICD-10-CM

## 2023-12-29 DIAGNOSIS — I351 Nonrheumatic aortic (valve) insufficiency: Secondary | ICD-10-CM

## 2023-12-29 DIAGNOSIS — M7989 Other specified soft tissue disorders: Secondary | ICD-10-CM

## 2023-12-29 DIAGNOSIS — I340 Nonrheumatic mitral (valve) insufficiency: Secondary | ICD-10-CM | POA: Diagnosis not present

## 2024-01-01 ENCOUNTER — Ambulatory Visit: Payer: Self-pay | Admitting: Cardiology

## 2024-01-02 ENCOUNTER — Telehealth: Payer: Self-pay | Admitting: *Deleted

## 2024-01-02 NOTE — Patient Outreach (Signed)
 Complex Care Management   Visit Note  01/17/2024   Name:  Patricia Walker MRN: [id number] DOB: January 11, 1946  Situation: Referral received for Complex Care Management related to longitudinal case management after [facility] services I obtained verbal consent from Patient.  Visit completed with Patient  on the phone  Nutrition & Health Review:  Lab Review: She voiced understanding of the recent lab results reviewed. Vitamin D  Intake: Patient voiced understanding of foods rich in vitamin D  taken with Vitamin C .  Sodium Management Suggested using potatoes to help absorb excess salt then discarding the potato. Recommended low-sodium cheese options: Swiss, mozzarella, ricotta, goat cheese, mascarpone. Discussed the DASH diet for heart health and sodium control. Educated on reading food labels to monitor sodium intake. For women over 70, the recommended daily sodium intake is 1,200 mg, with individual meals ideally containing no more than 400 mg. Example: Hash browns contain ~300 mg of sodium, which is a significant portion of the daily limit.    Lifestyle & Environment:  COPD Considerations:  No pets in the home. Some dusting  needs to be done to maintain air quality.   Nutrition History: Patient has been attempting manage her nutrition for over 2 years. She voiced understanding the option of making Healthy Food Choices: Most berries can be included in her diet. Requested various diet/meal plans    Background:   Past Medical History:  Diagnosis Date   Anxiety    Arthritis    Asthma    COPD exacerbation (HCC) 10/08/2020   Coronary artery disease    mild, nonobstructive   Depression    Dyspnea    on exertion   Dysrhythmia    Atrial Fibrillation   GERD (gastroesophageal reflux disease)    Heart murmur    Hyperlipidemia    Hypertension    Hypothyroidism    MI, old 2016   nonobstructive CAD by Cath   Morbid obesity (HCC)    Persistent atrial fibrillation (HCC)    Pneumonia  06/2018   and RSV   Psoriasis    Sleep apnea    compliant with CPAP   Thyroid  disease     Assessment: Patient Reported Symptoms:  Cognitive Cognitive Status: No symptoms reported, Able to follow simple commands      Neurological Neurological Review of Symptoms: No symptoms reported Oher Neurological Symptoms/Conditions [RPT]: neuropathy Neurological Management Strategies: Coping strategies, Medication therapy, Routine screening Neurological Self-Management Outcome: 3 (uncertain)  HEENT HEENT Symptoms Reported: Sudden change or loss of vision, Tearing HEENT Management Strategies: Medication therapy, Routine screening HEENT Self-Management Outcome: 3 (uncertain)    Cardiovascular Cardiovascular Symptoms Reported: Swelling in legs or feet Does patient have uncontrolled Hypertension?: No Cardiovascular Management Strategies: Medication therapy, Routine screening Weight: 300 lb (136.1 kg) Cardiovascular Self-Management Outcome: 3 (uncertain)  Respiratory Respiratory Symptoms Reported: Productive cough Other Respiratory Symptoms: report green sputum Respiratory Management Strategies: Adequate rest, Medication therapy, Routine screening Respiratory Self-Management Outcome: 3 (uncertain)  Endocrine Is patient diabetic?: No Endocrine Self-Management Outcome: 4 (good)  Gastrointestinal Gastrointestinal Symptoms Reported: No symptoms reported Gastrointestinal Management Strategies: Coping strategies Gastrointestinal Self-Management Outcome: 4 (good)    Genitourinary Genitourinary Symptoms Reported: No symptoms reported    Integumentary Integumentary Symptoms Reported: No symptoms reported Skin Management Strategies: Routine screening Skin Self-Management Outcome: 4 (good)  Musculoskeletal Musculoskelatal Symptoms Reviewed: Joint pain, Limited mobility Additional Musculoskeletal Details: right knee and shoulder pain Musculoskeletal Management Strategies: Medication  therapy Musculoskeletal Self-Management Outcome: 3 (uncertain) Musculoskeletal Comment: pain level at 8 followed by Dr  [place] in the past year?: No Number of falls in past year: 2 or more Was there an injury with Fall?: Yes Fall Risk Category Calculator: 2 Patient Fall Risk Level: Moderate Fall Risk Patient at Risk for Falls Due to: Orthopedic patient Fall risk Follow up: Falls evaluation completed  Psychosocial Psychosocial Symptoms Reported: No symptoms reported          01/17/2024    PHQ2-9 Depression Screening   Little interest or pleasure in doing things Not at all  Feeling down, depressed, or hopeless Several days  PHQ-2 - Total Score 1  Trouble falling or staying asleep, or sleeping too much    Feeling tired or having little energy    Poor appetite or overeating     Feeling bad about yourself - or that you are a failure or have let yourself or your family down    Trouble concentrating on things, such as reading the newspaper or watching television    Moving or speaking so slowly that other people could have noticed.  Or the opposite - being so fidgety or restless that you have been moving around a lot more than usual    Thoughts that you would be better off dead, or hurting yourself in some way    PHQ2-9 Total Score    If you checked off any problems, how difficult have these problems made it for you to do your work, take care of things at home, or get along with other people    Depression Interventions/Treatment      There were no vitals filed for this visit.  Medications Reviewed Today     Reviewed by Ramonita Suzen CROME, RN (Registered Nurse) on 01/17/24 at 0802  Med List Status: <None>   Medication Order Taking? Sig Documenting Provider Last Dose Status Informant  acetaminophen  (TYLENOL ) 325 MG tablet [id number] Yes Take 2 tablets (650 mg total) by mouth every 6 (six) hours as needed for mild pain (pain score 1-3) or moderate pain (pain score 4-6). Jens Durand, MD  Active Self  albuterol  (VENTOLIN  HFA) [phone] Base) MCG/ACT inhaler [id number] Yes INHALE 1-2 PUFFS EVERY 4-6 HOURS AS NEEDED FOR SHORTNESS OF BREATH [name], FNP  Active Self  atorvastatin  (LIPITOR) 40 MG tablet [id number]  ONCE A DAY FOR CHOLESTEROL [name], FNP  Active Self           Med Note CATHY OVAL VEAR [place] Nov 05, 2023  5:08 PM)    chlorpheniramine-HYDROcodone  (TUSSIONEX) 10-8 MG/5ML [id number] Yes Take 5 mLs by mouth every 12 (twelve) hours as needed for cough. [name], FNP  Active Self  dapagliflozin  propanediol (FARXIGA ) 10 MG TABS tablet [id number] Yes Take 1 tablet (10 mg total) by mouth daily before breakfast. Fernand Denyse LABOR, MD  Active   ELIQUIS  5 MG TABS tablet [id number]  TAKE 1 TABLET BY MOUTH TWICE A DAY [name], FNP  Active Self  enalapril  (VASOTEC ) 2.5 MG tablet [id number]  TAKE 1 TABLET BY MOUTH EVERY DAY [name], FNP  Active   esomeprazole (NEXIUM) 40 MG capsule [id number]  TAKE 1 CAPSULE BY MOUTH EVERY DAY IN THE MORNING [name], FNP  Active   fluticasone  (FLONASE ) 50 MCG/ACT nasal spray [id number]  Place 1 spray into both nostrils daily. [name], FNP  Active   furosemide  (LASIX ) 40 MG tablet [id number]  Take 0.5 tablets (20 mg total) by mouth daily. Fernand Denyse LABOR, MD  Active  gabapentin  (NEURONTIN ) 300 MG capsule [id number] Yes Take 300 mg by mouth daily. [provider]  Active Self  ipratropium-albuterol  (DUONEB) 0.5-2.5 (3) MG/3ML SOLN [id number] Yes Take 3 mLs by nebulization every 6 (six) hours as needed. [name], FNP  Active Self  meloxicam [facility]) 15 MG tablet [id number] Yes Take 15 mg by mouth daily. [provider]  Active Self  metolazone  (ZAROXOLYN ) 2.5 MG tablet [id number]  Take 1 tablet (2.5 mg total) by mouth daily for 3 days. Scoggins, Amber, NP  Expired 01/04/24 2359   metoprolol  succinate (TOPROL -XL) 25 MG 24 hr tablet [id number]  TAKE 1 TABLET (25 MG  TOTAL) BY MOUTH DAILY. [name], FNP  Active Self  nystatin  powder [id number] Yes Apply 1 Application topically 3 (three) times daily as needed. Irritation under breast and groin area [name], FNP  Active Self  TALTZ 80 MG/ML EMMANUEL [id number] Yes Inject 80 mg into the skin every 28 (twenty-eight) days. [provider]  Active Self  tirzepatide  (MOUNJARO ) 2.5 MG/0.5ML Pen [id number] Yes Inject 2.5 mg into the skin once a week. [provider]  Active   Vibegron  (GEMTESA ) 75 MG TABS [id number] Yes Take 1 tablet (75 mg total) by mouth daily. [name], FNP  Active Self            Recommendation:   PCP Follow-up Continue Current Plan of Care  Follow Up Plan:   Telephone follow up appointment date/time:  01/16/24 1030   Althia Egolf L. Ramonita, RN, BSN, CCM [place]  [facility], [facility] RN Care Manager Direct Dial: [phone]  Fax: [phone]

## 2024-01-04 ENCOUNTER — Encounter: Payer: Self-pay | Admitting: Cardiovascular Disease

## 2024-01-04 ENCOUNTER — Ambulatory Visit (INDEPENDENT_AMBULATORY_CARE_PROVIDER_SITE_OTHER): Admitting: Cardiovascular Disease

## 2024-01-04 VITALS — BP 124/83 | HR 61 | Ht 64.0 in | Wt 300.0 lb

## 2024-01-04 DIAGNOSIS — I4891 Unspecified atrial fibrillation: Secondary | ICD-10-CM | POA: Diagnosis not present

## 2024-01-04 DIAGNOSIS — E782 Mixed hyperlipidemia: Secondary | ICD-10-CM

## 2024-01-04 DIAGNOSIS — R0789 Other chest pain: Secondary | ICD-10-CM

## 2024-01-04 DIAGNOSIS — I5033 Acute on chronic diastolic (congestive) heart failure: Secondary | ICD-10-CM | POA: Diagnosis not present

## 2024-01-04 DIAGNOSIS — R0602 Shortness of breath: Secondary | ICD-10-CM

## 2024-01-04 MED ORDER — DAPAGLIFLOZIN PROPANEDIOL 10 MG PO TABS: 10.0000 mg | ORAL_TABLET | Freq: Every day | ORAL | 3 refills | Status: DC

## 2024-01-04 MED ORDER — FUROSEMIDE 40 MG PO TABS: 20.0000 mg | ORAL_TABLET | Freq: Every day | ORAL | 0 refills | Status: DC

## 2024-01-04 NOTE — Addendum Note (Signed)
 Addended by: FERNAND ALTER A on: 01/04/2024 11:26 AM   Modules accepted: Orders

## 2024-01-04 NOTE — Progress Notes (Signed)
 Cardiology Office Note   Date:  01/04/2024   ID:  Darlisa Spruiell Goffredo, DOB [birth year], MRN [id number]  PCP:  [name], FNP  Cardiologist:  Denyse Bathe, MD      History of Present Illness: Patricia Walker is a 78 y.o. female who presents for  Chief Complaint  Patient presents with   Follow-up    2 week follow up - echo results    Feels tired , weak, dizzy and SOB a lot.      Past Medical History:  Diagnosis Date   Anxiety    Arthritis    Asthma    COPD exacerbation (HCC) 10/08/2020   Coronary artery disease    mild, nonobstructive   Depression    Dyspnea    on exertion   Dysrhythmia    Atrial Fibrillation   GERD (gastroesophageal reflux disease)    Heart murmur    Hyperlipidemia    Hypertension    Hypothyroidism    MI, old 2016   nonobstructive CAD by Cath   Morbid obesity (HCC)    Persistent atrial fibrillation (HCC)    Pneumonia 06/2018   and RSV   Psoriasis    Sleep apnea    compliant with CPAP   Thyroid  disease      Past Surgical History:  Procedure Laterality Date   ARTERY BIOPSY Right 07/21/2017   Procedure: BIOPSY TEMPORAL ARTERY;  Surgeon: Jama Cordella MATSU, MD;  Location: [facility];  Service: Vascular;  Laterality: Right;   CARDIAC CATHETERIZATION     CARDIOVERSION Right 09/01/2016   Procedure: Cardioversion;  Surgeon: Bathe Denyse LABOR, MD;  Location: [facility];  Service: Cardiovascular;  Laterality: Right;   CARDIOVERSION N/A 09/09/2016   Procedure: Cardioversion;  Surgeon: Bathe Denyse LABOR, MD;  Location: [facility];  Service: Cardiovascular;  Laterality: N/A;   CATARACT EXTRACTION W/PHACO Right 03/26/2019   Procedure: CATARACT EXTRACTION PHACO AND INTRAOCULAR LENS PLACEMENT (IOC) RIGHT 6.45, 00:39.9;  Surgeon: Jaye Fallow, MD;  Location: [facility];  Service: Ophthalmology;  Laterality: Right;   CATARACT EXTRACTION W/PHACO Left 04/16/2019   Procedure: CATARACT EXTRACTION PHACO AND INTRAOCULAR LENS PLACEMENT (IOC) LEFT;   3.14,  00:24.9;  Surgeon: Jaye Fallow, MD;  Location: [facility];  Service: Ophthalmology;  Laterality: Left;  sleep apnea-CPAP   COLONOSCOPY WITH PROPOFOL  N/A 08/28/2019   Procedure: COLONOSCOPY WITH PROPOFOL ;  Surgeon: Toledo, Ladell POUR, MD;  Location: [facility];  Service: Gastroenterology;  Laterality: N/A;   ELECTROPHYSIOLOGIC STUDY N/A 02/01/2016   Procedure: CARDIOVERSION;  Surgeon: Denyse LABOR Bathe, MD;  Location: [facility];  Service: Cardiovascular;  Laterality: N/A;   ESOPHAGEAL DILATION     ESOPHAGOGASTRODUODENOSCOPY (EGD) WITH PROPOFOL  N/A 02/06/2017   Procedure: ESOPHAGOGASTRODUODENOSCOPY (EGD) WITH PROPOFOL ;  Surgeon: Therisa Bi, MD;  Location[facility];  Service: Gastroenterology;  Laterality: N/A;   ESOPHAGOGASTRODUODENOSCOPY (EGD) WITH PROPOFOL  N/A 08/28/2019   Procedure: ESOPHAGOGASTRODUODENOSCOPY (EGD) WITH PROPOFOL ;  Surgeon: Toledo, Ladell POUR, MD;  Location: [facility];  Service: Gastroenterology;  Laterality: N/A;   EUS N/A 02/16/2017   Procedure: FULL UPPER ENDOSCOPIC ULTRASOUND (EUS) RADIAL;  Surgeon: Elta Fonda SQUIBB, MD;  Location: [facility];  Service: Gastroenterology;  Laterality: N/A;   LEFT HEART CATH AND CORONARY ANGIOGRAPHY N/A 09/08/2016   Procedure: Left Heart Cath and Coronary Angiography;  Surgeon: Bathe Denyse LABOR, MD;  Location: [facility];  Service: Cardiovascular;  Laterality: N/A;   US  ECHOCARDIOGRAPHY       Current Outpatient Medications  Medication Sig  Dispense Refill   acetaminophen  (TYLENOL ) 325 MG tablet Take 2 tablets (650 mg total) by mouth every 6 (six) hours as needed for mild pain (pain score 1-3) or moderate pain (pain score 4-6).     albuterol  (VENTOLIN  HFA) 108 (90 Base) MCG/ACT inhaler INHALE 1-2 PUFFS EVERY 4-6 HOURS AS NEEDED FOR SHORTNESS OF BREATH 18 each 6   atorvastatin  (LIPITOR) 40 MG tablet ONCE A DAY FOR CHOLESTEROL 90 tablet 3   chlorpheniramine-HYDROcodone  (TUSSIONEX) 10-8 MG/5ML Take 5 mLs by mouth  every 12 (twelve) hours as needed for cough. 115 mL 0   ELIQUIS  5 MG TABS tablet TAKE 1 TABLET BY MOUTH TWICE A DAY 180 tablet 1   enalapril  (VASOTEC ) 2.5 MG tablet TAKE 1 TABLET BY MOUTH EVERY DAY 90 tablet 1   esomeprazole (NEXIUM) 40 MG capsule TAKE 1 CAPSULE BY MOUTH EVERY DAY IN THE MORNING 90 capsule 3   fluticasone  (FLONASE ) 50 MCG/ACT nasal spray Place 1 spray into both nostrils daily.     furosemide  (LASIX ) 40 MG tablet Take 0.5 tablets (20 mg total) by mouth daily. 30 tablet 0   gabapentin  (NEURONTIN ) 300 MG capsule Take 300 mg by mouth daily.     ipratropium-albuterol  (DUONEB) 0.5-2.5 (3) MG/3ML SOLN Take 3 mLs by nebulization every 6 (six) hours as needed. 120 mL 2   meloxicam (MOBIC) 15 MG tablet Take 15 mg by mouth daily.     metolazone  (ZAROXOLYN ) 2.5 MG tablet Take 1 tablet (2.5 mg total) by mouth daily for 3 days. 3 tablet 0   metoprolol  succinate (TOPROL -XL) 25 MG 24 hr tablet TAKE 1 TABLET (25 MG TOTAL) BY MOUTH DAILY. 90 tablet 1   nystatin  powder Apply 1 Application topically 3 (three) times daily as needed. Irritation under breast and groin area 60 g 1   TALTZ 80 MG/ML SOAJ Inject 80 mg into the skin every 28 (twenty-eight) days.     tirzepatide  (MOUNJARO ) 2.5 MG/0.5ML Pen Inject 2.5 mg into the skin once a week.     Vibegron  (GEMTESA ) 75 MG TABS Take 1 tablet (75 mg total) by mouth daily. 30 tablet 5   dapagliflozin  propanediol (FARXIGA ) 10 MG TABS tablet Take 1 tablet (10 mg total) by mouth daily before breakfast. 30 tablet 3   No current facility-administered medications for this visit.    Allergies:   Codeine and Other    Social History:   reports that she quit smoking about 10 years ago. Her smoking use included cigarettes. She has never used smokeless tobacco. She reports that she does not drink alcohol and does not use drugs.   Family History:  family history includes Breast cancer in her paternal aunt; Heart attack in her father; Other in her father.    ROS:      Review of Systems  Constitutional: Negative.   HENT: Negative.    Eyes: Negative.   Respiratory: Negative.    Gastrointestinal: Negative.   Genitourinary: Negative.   Musculoskeletal: Negative.   Skin: Negative.   Neurological: Negative.   Endo/Heme/Allergies: Negative.   Psychiatric/Behavioral: Negative.    All other systems reviewed and are negative.     All other systems are reviewed and negative.    PHYSICAL EXAM: VS:  BP 124/83   Pulse 61   Ht 5' 4 (1.626 m)   Wt 300 lb (136.1 kg)   SpO2 97%   BMI 51.49 kg/m  , BMI Body mass index is 51.49 kg/m. Last weight:  Wt Readings from Last 3  Encounters:  01/04/24 300 lb (136.1 kg)  12/22/23 (!) 303 lb 6.4 oz (137.6 kg)  12/20/23 (!) 309 lb 12.8 oz (140.5 kg)     Physical Exam Constitutional:      Appearance: Normal appearance.  Cardiovascular:     Rate and Rhythm: Normal rate and regular rhythm.     Heart sounds: Normal heart sounds.  Pulmonary:     Effort: Pulmonary effort is normal.     Breath sounds: Normal breath sounds.  Musculoskeletal:     Right lower leg: No edema.     Left lower leg: No edema.  Neurological:     Mental Status: She is alert.       EKG:   Recent Labs: 11/05/2023: B Natriuretic Peptide 241.8 12/22/2023: ALT 13; BUN 14; Creatinine, Ser 1.06; Hemoglobin 14.1; Magnesium  1.9; Platelets 298; Potassium 4.0; Sodium 141; TSH 2.070    Lipid Panel    Component Value Date/Time   CHOL 209 (H) 12/22/2023 1359   CHOL 190 05/18/2012 0928   TRIG 118 12/22/2023 1359   TRIG 138 05/18/2012 0928   HDL 39 (L) 12/22/2023 1359   HDL 35 (L) 05/18/2012 0928   CHOLHDL 5.4 (H) 12/22/2023 1359   VLDL 28 05/18/2012 0928   LDLCALC 149 (H) 12/22/2023 1359   LDLCALC 127 (H) 05/18/2012 9071      Other studies Reviewed: Additional studies/ records that were reviewed today include:  Review of the above records demonstrates:       No data to display            ASSESSMENT AND PLAN:     ICD-10-CM   1. Other chest pain  R07.89 dapagliflozin  propanediol (FARXIGA ) 10 MG TABS tablet    DISCONTINUED: dapagliflozin  propanediol (FARXIGA ) 10 MG TABS tablet   Normal stress test 12/24    2. Mixed hyperlipidemia  E78.2 dapagliflozin  propanediol (FARXIGA ) 10 MG TABS tablet    DISCONTINUED: dapagliflozin  propanediol (FARXIGA ) 10 MG TABS tablet    3. Atrial fibrillation, unspecified type (HCC)  I48.91 dapagliflozin  propanediol (FARXIGA ) 10 MG TABS tablet    DISCONTINUED: dapagliflozin  propanediol (FARXIGA ) 10 MG TABS tablet   Afib is permanant. Rate controlled.    4. Acute on chronic heart failure with preserved ejection fraction (HFpEF) (HCC)  I50.33 dapagliflozin  propanediol (FARXIGA ) 10 MG TABS tablet    DISCONTINUED: dapagliflozin  propanediol (FARXIGA ) 10 MG TABS tablet   In afib, LVEF was normal.    5. SOB (shortness of breath)  R06.02 dapagliflozin  propanediol (FARXIGA ) 10 MG TABS tablet    DISCONTINUED: dapagliflozin  propanediol (FARXIGA ) 10 MG TABS tablet   still SOB. Add farxiag.       Problem List Items Addressed This Visit       Cardiovascular and Mediastinum   Atrial fibrillation (HCC)   Relevant Medications   dapagliflozin  propanediol (FARXIGA ) 10 MG TABS tablet   Acute on chronic heart failure with preserved ejection fraction (HFpEF) (HCC)   Relevant Medications   dapagliflozin  propanediol (FARXIGA ) 10 MG TABS tablet     Other   Hyperlipidemia, unspecified   Relevant Medications   dapagliflozin  propanediol (FARXIGA ) 10 MG TABS tablet   Other Visit Diagnoses       Other chest pain    -  Primary   Normal stress test 12/24   Relevant Medications   dapagliflozin  propanediol (FARXIGA ) 10 MG TABS tablet     SOB (shortness of breath)       still SOB. Add farxiag.   Relevant Medications   dapagliflozin   propanediol (FARXIGA ) 10 MG TABS tablet          Disposition:   Return in about 4 weeks (around 02/01/2024).    Total time spent: 30  minutes  Signed,  Denyse Bathe, MD  01/04/2024 11:24 AM    [facility]

## 2024-01-10 DIAGNOSIS — J45909 Unspecified asthma, uncomplicated: Secondary | ICD-10-CM | POA: Diagnosis not present

## 2024-01-11 ENCOUNTER — Other Ambulatory Visit: Payer: Self-pay | Admitting: Family

## 2024-01-11 NOTE — Unmapped (Signed)
 [facility] Refill Coordination Note    Specialty Medication(s) to be Shipped:   Inflammatory Disorders: Taltz     Other medication(s) to be shipped: No additional medications requested for fill at this time    Specialty Medications not needed at this time: N/A     Lisa Carlson, DOB: 11-12-1945  Phone: [phone] (home)       All above HIPAA information was verified with patient.     Was a Nurse, learning disability used for this call? No    Completed refill call assessment today to schedule patient's medication shipment from the [facility]  [phone]).  All relevant notes have been reviewed.     Specialty medication(s) and dose(s) confirmed: Regimen is correct and unchanged.   Changes to medications: [name] reports no changes at this time.  Changes to insurance: No  New side effects reported not previously addressed with a pharmacist or physician: None reported  Questions for the pharmacist: No    Confirmed patient received a Conservation officer, historic buildings and a Surveyor, mining with first shipment. The patient will receive a drug information handout for each medication shipped and additional FDA Medication Guides as required.       DISEASE/MEDICATION-SPECIFIC INFORMATION        For patients on injectable medications: Next injection is scheduled for 01/17/2024.    SPECIALTY MEDICATION ADHERENCE     Medication Adherence    Specialty Medication: TALTZ  AUTOINJECTOR 80 mg/mL Atin auto-injector (ixekizumab )  Patient is on additional specialty medications: No              Were doses missed due to medication being on hold? No    TALTZ  AUTOINJECTOR 80 mg/mL Atin auto-injector (ixekizumab )  : 0 doses of medicine on hand       REFERRAL TO PHARMACIST     Referral to the pharmacist: Not needed      SHIPPING     Shipping address confirmed in Epic.     Cost and Payment: Patient has a $0 copay, payment information is not required.    Delivery Scheduled: Yes, Expected medication delivery date: 01/15/2024.     Medication will be delivered via Same Day Courier to the prescription address in Epic WAM.    Lisa Carlson   [facility]  Specialty Technician

## 2024-01-15 ENCOUNTER — Other Ambulatory Visit: Payer: Self-pay | Admitting: Family

## 2024-01-15 DIAGNOSIS — I2510 Atherosclerotic heart disease of native coronary artery without angina pectoris: Secondary | ICD-10-CM

## 2024-01-15 MED FILL — TALTZ AUTOINJECTOR 80 MG/ML SUBCUTANEOUS: SUBCUTANEOUS | 28 days supply | Qty: 1 | Fill #3

## 2024-01-16 ENCOUNTER — Other Ambulatory Visit: Payer: Self-pay | Admitting: *Deleted

## 2024-01-16 NOTE — Patient Outreach (Unsigned)
 Complex Care Management   Visit Note  01/17/2024  Name:  Patricia Walker MRN: [id number] DOB: [birth year]  Situation: Referral received for Complex Care Management related to COPD and urgent referral for longitudinal CCM program I obtained verbal consent from Patient.  Visit completed with Patient  on the phone  Musculoskeletal Pain: Patient reports persistent right knee and shoulder pain; Mineo require repeat injection. Currently taking pain medication, primarily at night. A revised pain management plan is needed.   Ocular Symptoms: Complains of right eye watering; evaluation recommended.   Pulmonary: History of COPD; scheduled to see pulmonologist in February 2026. History of COVID-19 infection x 3. Patient inquired about treatment for persistent cough with hacky green sputum.   Sleep Apnea: Awaiting sleep study to obtain a new CPAP machine. Prefers home-based study; order needed.   Preventive Care: Needs to schedule mammogram independently. Reports not completing recommended screenings due to pain and shortness of breath.   Medications: Taltz: Received and self-administered. Considering switch from [facility] to [facility] for Mounjaro  coverage due to hemoglobin level of 6.    Weight Management: Current weight: 300 lbs. Previously on keto diet; lost 9 lbs. Encouraged to continue monitoring food labels.   Background:   Past Medical History:  Diagnosis Date   Anxiety    Arthritis    Asthma    COPD exacerbation (HCC) 10/08/2020   Coronary artery disease    mild, nonobstructive   Depression    Dyspnea    on exertion   Dysrhythmia    Atrial Fibrillation   GERD (gastroesophageal reflux disease)    Heart murmur    Hyperlipidemia    Hypertension    Hypothyroidism    MI, old 2016   nonobstructive CAD by Cath   Morbid obesity (HCC)    Persistent atrial fibrillation (HCC)    Pneumonia 06/2018   and RSV   Psoriasis    Sleep apnea    compliant with CPAP   Thyroid  disease      Assessment: Patient Reported Symptoms:  Cognitive Cognitive Status: No symptoms reported, Insightful and able to interpret abstract concepts, Normal speech and language skills      Neurological      HEENT HEENT Symptoms Reported: Sudden change or loss of vision, Tearing HEENT Management Strategies: Routine screening, Medication therapy HEENT Self-Management Outcome: 3 (uncertain)    Cardiovascular Cardiovascular Symptoms Reported: Swelling in legs or feet Does patient have uncontrolled Hypertension?: No Cardiovascular Management Strategies: Medication therapy, Routine screening Weight: 300 lb (136.1 kg) Cardiovascular Self-Management Outcome: 3 (uncertain)  Respiratory Respiratory Symptoms Reported: Productive cough Other Respiratory Symptoms: reports sputum now green Respiratory Management Strategies: Adequate rest, Medication therapy, Routine screening Respiratory Self-Management Outcome: 3 (uncertain)  Endocrine Endocrine Symptoms Reported: No symptoms reported Endocrine Self-Management Outcome: 4 (good)  Gastrointestinal Gastrointestinal Symptoms Reported: No symptoms reported Gastrointestinal Self-Management Outcome: 4 (good)    Genitourinary Genitourinary Symptoms Reported: No symptoms reported Genitourinary Self-Management Outcome: 4 (good)  Integumentary Integumentary Symptoms Reported: No symptoms reported Skin Management Strategies: Routine screening Skin Self-Management Outcome: 4 (good)  Musculoskeletal Musculoskelatal Symptoms Reviewed: Joint pain, Limited mobility Additional Musculoskeletal Details: right knee and shoulder pain today Musculoskeletal Management Strategies: Medication therapy, Routine screening Musculoskeletal Self-Management Outcome: 3 (uncertain) Musculoskeletal Comment: pain level at 8 followed by Dr Gattis      Psychosocial Psychosocial Symptoms Reported: No symptoms reported Behavioral Health Self-Management Outcome: 4 (good)         01/17/2024    PHQ2-9 Depression Screening   Little interest or  pleasure in doing things    Feeling down, depressed, or hopeless    PHQ-2 - Total Score    Trouble falling or staying asleep, or sleeping too much    Feeling tired or having little energy    Poor appetite or overeating     Feeling bad about yourself - or that you are a failure or have let yourself or your family down    Trouble concentrating on things, such as reading the newspaper or watching television    Moving or speaking so slowly that other people could have noticed.  Or the opposite - being so fidgety or restless that you have been moving around a lot more than usual    Thoughts that you would be better off dead, or hurting yourself in some way    PHQ2-9 Total Score    If you checked off any problems, how difficult have these problems made it for you to do your work, take care of things at home, or get along with other people    Depression Interventions/Treatment      There were no vitals filed for this visit.  Medications Reviewed Today   Medications were not reviewed in this encounter     Recommendation:   PCP Follow-up Specialty provider follow-up Needs to schedule her mammogram, dental follow up Continue Current Plan of Care Orders needed for home sleep study, cough medicine/treatment for productive green sputum,                                           pain management plan is needed                                          Referral for evaluation and treatment of right eye tearing.    Follow Up Plan:   Telephone follow up appointment date/time:  1030 Wednesday 02/14/24 with Hendricks Kay Iha L. Ramonita, RN, BSN, CCM   [facility], [facility] RN Care Manager Direct Dial: [phone]  Fax: [phone]

## 2024-01-17 NOTE — Patient Instructions (Addendum)
 Visit Information  Thank you for taking time to visit with me today. Please don't hesitate to contact me if I can be of assistance to you before our next scheduled appointment  Vitamin D  rich foods: fatty fish (salmon, mackerel, tuna, trout, sardines, swordfish), mushrooms, dairy/eggs, milk (soy, almond, oat, cow's) yogurt, breakfast cereal, cod liver oil, dark chocolate , spending time outside in the sun,   :ow sodium snacks are fresh fruit, vegetables, unsalted nuts, hard boiled eggs , greek yogurt, roasted edamame, air popped popcorn, avocado slices, chia pudding, hummus, rice cakes, tumeric balls, low sodium crackers, unsweetened applesauce, low sodium pretzels  Our next appointment is by telephone on 01/16/24 at 1030 Please call the care guide team at [phone] if you need to cancel or reschedule your appointment.   Following is a copy of your care plan:   Goals Addressed             This Visit's Progress    managing low sodium, heart healthy diet/life style change to decrease weight [facility] RN Care Plan       Problems:  Chronic Disease Management support and education needs related to managing low sodium, heart healthy diet/lifestyle change to lose weight  Goal: Over the next 6  months the Patient will demonstrate Improved and lifestyle nutritional/exercise health management independence as evidenced by decreasing weight values        <300 lbs  Interventions:   Weight Loss Interventions: Provided patient and/or caregiver with contact information about various diet plans, (DASH, mediterranean, low sodium+) Chemical engineer) Provided verbal and/or written education to patient re: provider recommended life style modifications  Screening for signs and symptoms of depression Reviewed recommended dietary changes: avoid fad diets, make small/incremental dietary and exercise changes, eat at the table and avoid eating in front of the TV, plan management of cravings,  monitor snacking and cravings in food diary Assessed social determinant of health barriers Answered various questions, assisted her with reading a food label at her home to discuss importance of a serving size, sodium, cholesterol, sugar amounts. Provided education information via mail on diets vitamin D , exercise     SDOH Barriers Patient interviewed and SDOH assessment performed        SDOH Interventions    Flowsheet Row Telephone from 11/10/2023 in [facility] POPULATION HEALTH DEPARTMENT  SDOH Interventions   Food Insecurity Interventions Intervention Not Indicated  Housing Interventions Intervention Not Indicated  Transportation Interventions Intervention Not Indicated  Utilities Interventions Intervention Not Indicated   Patient interviewed and appropriate assessments performed  Patient Self-Care Activities:  Attend all scheduled provider appointments Call provider office for new concerns or questions  eat more whole grains, fruits and vegetables, lean meats and healthy fats limit salt intake to 1200 mg/day Review all education materials and ask questions as needed  Plan:  Telephone follow up appointment with care management team member scheduled for:  01/16/24 1030             Please call the Suicide and Crisis Lifeline: 988 call the [place]  National Suicide Prevention Lifeline: 802 704 2506 or TTY: 902-796-0304 TTY 6173498446) to talk to a trained counselor call 1-800-273-TALK (toll free, 24 hour hotline) call 911 if you are experiencing a Mental Health or Behavioral Health Crisis or need someone to talk to.  Patient verbalizes understanding of instructions and care plan provided today and agrees to view in MyChart. Active MyChart status and patient understanding of how to access instructions and care plan via MyChart confirmed  with patient.     Marcus Schwandt L. Ramonita, RN, BSN, CCM [place]  [facility], [facility] RN Care Manager Direct  Dial: [phone]  Fax: [phone]

## 2024-01-17 NOTE — Patient Instructions (Signed)
 Visit Information  Thank you for taking time to visit with me today. Please don't hesitate to contact me if I can be of assistance to you before our next scheduled appointment.  Your next care management appointment is by telephone on 02/14/24 at by Hendricks Her RN CM     Please call the care guide team at [phone] if you need to cancel, schedule, or reschedule an appointment.   Please call the Suicide and Crisis Lifeline: 988 call the [place]  National Suicide Prevention Lifeline: (984)565-0586 or TTY: (802) 693-1144 TTY 229-712-9513) to talk to a trained counselor call 1-800-273-TALK (toll free, 24 hour hotline) call 911 if you are experiencing a Mental Health or Behavioral Health Crisis or need someone to talk to.  Ricahrd Schwager L. Ramonita, RN, BSN, CCM [place]  [facility], [facility] RN Care Manager Direct Dial: [phone]  Fax: [phone]

## 2024-01-17 NOTE — Addendum Note (Signed)
 Addended by: ORLEAN PALMA on: 01/17/2024 09:17 AM   Modules accepted: Orders

## 2024-01-18 MED ORDER — HYDROCOD POLI-CHLORPHE POLI ER 10-8 MG/5ML PO SUER: 5.0000 mL | Freq: Two times a day (BID) | ORAL | 0 refills | Status: DC | PRN

## 2024-01-22 ENCOUNTER — Observation Stay
Admission: EM | Admit: 2024-01-22 | Discharge: 2024-01-23 | Disposition: A | Discharge status: Home / Self Care | Attending: Emergency Medicine | Admitting: Emergency Medicine

## 2024-01-22 ENCOUNTER — Other Ambulatory Visit: Payer: Self-pay | Admitting: Family

## 2024-01-22 ENCOUNTER — Emergency Department

## 2024-01-22 ENCOUNTER — Other Ambulatory Visit: Payer: Self-pay

## 2024-01-22 DIAGNOSIS — I5030 Unspecified diastolic (congestive) heart failure: Secondary | ICD-10-CM | POA: Insufficient documentation

## 2024-01-22 DIAGNOSIS — I480 Paroxysmal atrial fibrillation: Principal | ICD-10-CM | POA: Insufficient documentation

## 2024-01-22 DIAGNOSIS — Z6841 Body Mass Index (BMI) 40.0 and over, adult: Secondary | ICD-10-CM | POA: Insufficient documentation

## 2024-01-22 DIAGNOSIS — R29818 Other symptoms and signs involving the nervous system: Secondary | ICD-10-CM | POA: Diagnosis not present

## 2024-01-22 DIAGNOSIS — R001 Bradycardia, unspecified: Secondary | ICD-10-CM | POA: Diagnosis not present

## 2024-01-22 DIAGNOSIS — G4733 Obstructive sleep apnea (adult) (pediatric): Secondary | ICD-10-CM | POA: Diagnosis present

## 2024-01-22 DIAGNOSIS — I110 Hypertensive heart disease with heart failure: Secondary | ICD-10-CM | POA: Diagnosis not present

## 2024-01-22 DIAGNOSIS — R531 Weakness: Secondary | ICD-10-CM | POA: Insufficient documentation

## 2024-01-22 DIAGNOSIS — Z7901 Long term (current) use of anticoagulants: Secondary | ICD-10-CM | POA: Insufficient documentation

## 2024-01-22 DIAGNOSIS — E039 Hypothyroidism, unspecified: Secondary | ICD-10-CM | POA: Diagnosis present

## 2024-01-22 DIAGNOSIS — J449 Chronic obstructive pulmonary disease, unspecified: Secondary | ICD-10-CM | POA: Diagnosis present

## 2024-01-22 DIAGNOSIS — R55 Syncope and collapse: Principal | ICD-10-CM | POA: Insufficient documentation

## 2024-01-22 DIAGNOSIS — I10 Essential (primary) hypertension: Secondary | ICD-10-CM | POA: Diagnosis present

## 2024-01-22 DIAGNOSIS — N179 Acute kidney failure, unspecified: Secondary | ICD-10-CM

## 2024-01-22 DIAGNOSIS — I4891 Unspecified atrial fibrillation: Secondary | ICD-10-CM | POA: Diagnosis present

## 2024-01-22 DIAGNOSIS — I5032 Chronic diastolic (congestive) heart failure: Secondary | ICD-10-CM | POA: Diagnosis present

## 2024-01-22 DIAGNOSIS — I2510 Atherosclerotic heart disease of native coronary artery without angina pectoris: Secondary | ICD-10-CM | POA: Diagnosis not present

## 2024-01-22 DIAGNOSIS — Z79899 Other long term (current) drug therapy: Secondary | ICD-10-CM | POA: Insufficient documentation

## 2024-01-22 DIAGNOSIS — R0602 Shortness of breath: Secondary | ICD-10-CM | POA: Diagnosis not present

## 2024-01-22 DIAGNOSIS — R200 Anesthesia of skin: Secondary | ICD-10-CM

## 2024-01-22 DIAGNOSIS — Z87891 Personal history of nicotine dependence: Secondary | ICD-10-CM | POA: Diagnosis not present

## 2024-01-22 DIAGNOSIS — I517 Cardiomegaly: Secondary | ICD-10-CM | POA: Diagnosis not present

## 2024-01-22 DIAGNOSIS — J4489 Other specified chronic obstructive pulmonary disease: Secondary | ICD-10-CM | POA: Diagnosis not present

## 2024-01-22 DIAGNOSIS — R42 Dizziness and giddiness: Secondary | ICD-10-CM | POA: Insufficient documentation

## 2024-01-22 LAB — CBC
HCT: 43.2 % (ref 36.0–46.0)
Hemoglobin: 14.2 g/dL (ref 12.0–15.0)
MCH: 27.9 pg (ref 26.0–34.0)
MCHC: 32.9 g/dL (ref 30.0–36.0)
MCV: 84.9 fL (ref 80.0–100.0)
Platelets: 304 K/uL (ref 150–400)
RBC: 5.09 MIL/uL (ref 3.87–5.11)
RDW: 13.6 % (ref 11.5–15.5)
WBC: 7.5 K/uL (ref 4.0–10.5)
nRBC: 0 % (ref 0.0–0.2)

## 2024-01-22 LAB — COMPREHENSIVE METABOLIC PANEL WITH GFR
ALT: 13 U/L (ref 0–44)
AST: 18 U/L (ref 15–41)
Albumin: 3.3 g/dL — ABNORMAL LOW (ref 3.5–5.0)
Alkaline Phosphatase: 60 U/L (ref 38–126)
Anion gap: 12 (ref 5–15)
BUN: 20 mg/dL (ref 8–23)
CO2: 26 mmol/L (ref 22–32)
Calcium: 8.5 mg/dL — ABNORMAL LOW (ref 8.9–10.3)
Chloride: 103 mmol/L (ref 98–111)
Creatinine, Ser: 1.15 mg/dL — ABNORMAL HIGH (ref 0.44–1.00)
GFR, Estimated: 49 mL/min — ABNORMAL LOW (ref >60)
Glucose, Bld: 76 mg/dL (ref 70–99)
Potassium: 3.8 mmol/L (ref 3.5–5.1)
Sodium: 141 mmol/L (ref 135–145)
Total Bilirubin: 0.9 mg/dL (ref 0.0–1.2)
Total Protein: 6.6 g/dL (ref 6.5–8.1)

## 2024-01-22 LAB — URINALYSIS, ROUTINE W REFLEX MICROSCOPIC
Bilirubin Urine: NEGATIVE
Glucose, UA: NEGATIVE mg/dL
Hgb urine dipstick: NEGATIVE
Ketones, ur: NEGATIVE mg/dL
Leukocytes,Ua: NEGATIVE
Nitrite: NEGATIVE
Protein, ur: NEGATIVE mg/dL
Specific Gravity, Urine: 1.016 (ref 1.005–1.030)
pH: 5 (ref 5.0–8.0)

## 2024-01-22 LAB — BRAIN NATRIURETIC PEPTIDE: B Natriuretic Peptide: 144.7 pg/mL — ABNORMAL HIGH (ref 0.0–100.0)

## 2024-01-22 LAB — TROPONIN I (HIGH SENSITIVITY): Troponin I (High Sensitivity): 11 ng/L (ref <18)

## 2024-01-22 NOTE — ED Provider Notes (Addendum)
 [facility] Provider Note    Event Date/Time   First MD Initiated Contact with Patient 01/22/24 2210     (approximate)   History   Shortness of Breath (/) and Dizziness   HPI  Patricia Walker is a 78 y.o. female with a history of COPD, CAD, A-fib on Eliquis , hypertension, and GERD who presents with dizziness, acute onset this afternoon, described as feeling very lightheaded and weak.  She had associated shortness of breath.  In addition she started to have numbness and tingling in the left leg, and then in both arms.  She denies any weakness in her arms or legs.  She denies any headache.  She has no fever.  She has no vomiting or diarrhea.  The patient states that symptoms have somewhat improved over the last several hours while she waited, but she still is not feeling right.  I reviewed the past medical records per the patient's most recent outpatient encounter was with cardiology on 9/25 for follow-up of her chronic conditions.  She reported feeling weak, dizzy, and short of breath at that time.   Physical Exam   Triage Vital Signs: ED Triage Vitals  Encounter Vitals Group     BP 01/22/24 1726 117/85     Girls Systolic BP Percentile --      Girls Diastolic BP Percentile --      Boys Systolic BP Percentile --      Boys Diastolic BP Percentile --      Pulse Rate 01/22/24 1726 (!) 53     Resp 01/22/24 1729 (!) 22     Temp 01/22/24 1726 99.1 F (37.3 C)     Temp src --      SpO2 01/22/24 1726 100 %     Weight 01/22/24 1726 300 lb (136.1 kg)     Height 01/22/24 1726 5' 4 (1.626 m)     Head Circumference --      Peak Flow --      Pain Score --      Pain Loc --      Pain Education --      Exclude from Growth Chart --     Most recent vital signs: Vitals:   01/22/24 1729 01/22/24 2224  BP:  (!) 141/84  Pulse:  68  Resp: (!) 22 18  Temp:  97.9 F (36.6 C)  SpO2:  100%     General: Alert, comfortable appearing, no distress.  CV:  Good  peripheral perfusion.  Bradycardic, regular rhythm. Resp:  Normal effort.  Lungs CTAB. Abd:  No distention. Other:  EOMI.  PERRLA.  No photophobia.  No facial droop.  Normal speech.  5/5 motor strength and intact sensation all extremities.  No ataxia on finger-nose.   ED Results / Procedures / Treatments   Labs (all labs ordered are listed, but only abnormal results are displayed) Labs Reviewed  COMPREHENSIVE METABOLIC PANEL WITH GFR - Abnormal; Notable for the following components:      Result Value   Creatinine, Ser 1.15 (*)    Calcium  8.5 (*)    Albumin 3.3 (*)    GFR, Estimated 49 (*)    All other components within normal limits  URINALYSIS, ROUTINE W REFLEX MICROSCOPIC - Abnormal; Notable for the following components:   Color, Urine YELLOW (*)    APPearance CLEAR (*)    All other components within normal limits  BRAIN NATRIURETIC PEPTIDE - Abnormal; Notable for the following components:  B Natriuretic Peptide 144.7 (*)    All other components within normal limits  RESP PANEL BY RT-PCR (RSV, FLU A&B, COVID)  RVPGX2  CBC  TROPONIN I (HIGH SENSITIVITY)  TROPONIN I (HIGH SENSITIVITY)     EKG  ED ECG REPORT I, Waylon Cassis, the attending physician, personally viewed and interpreted this ECG.  Date: 01/22/2024 EKG Time: 1724 Rate: 46 Rhythm: Atrial fibrillation QRS Axis: Left axis Intervals: normal ST/T Wave abnormalities: normal Narrative Interpretation: no evidence of acute ischemia    RADIOLOGY  Chest x-ray: I independently viewed and interpreted the images; there is cardiomegaly with no focal consolidation or edema  PROCEDURES:  Critical Care performed: No  Procedures   MEDICATIONS ORDERED IN ED: Medications - No data to display   IMPRESSION / MDM / ASSESSMENT AND PLAN / ED COURSE  I reviewed the triage vital signs and the nursing notes.  78 year old female with PMH as noted above presents with acute onset of dizziness, generalized weakness,  shortness of breath this afternoon, now somewhat improved.  On exam, her vital signs are normal except for borderline hypertension.  Neurologic exam is nonfocal.  Physical exam is otherwise unremarkable for acute findings except for borderline bradycardia.  Differential diagnosis includes, but is not limited to, symptomatic bradycardia, other cardiac dysrhythmia, dehydration, electrolyte abnormality, other metabolic cause, UTI or other infection, ACS or other cardiac etiology, less likely CVA/TIA given the lack of focal neurologic findings.  Urinalysis is negative.  CMP and CBC show no acute findings.  I added on a troponin and CT head for further evaluation.  Patient's presentation is most consistent with acute presentation with potential threat to life or bodily function.  The patient is on the cardiac monitor to evaluate for evidence of arrhythmia and/or significant heart rate changes.  ----------------------------------------- 11:42 PM on 01/22/2024 -----------------------------------------  CT head is negative.  Troponin is also negative.  I have added on a respiratory panel.  Given the patient is continuing to feel dizzy and weak, she likely will need admission for further monitoring.  I consulted Dr. Cleatus from the hospitalist service; based on our discussion she agrees to evaluate the patient for admission.   FINAL CLINICAL IMPRESSION(S) / ED DIAGNOSES   Final diagnoses:  Near syncope  Generalized weakness  Symptomatic bradycardia     Rx / DC Orders   ED Discharge Orders     None        Note:  This document was prepared using Dragon voice recognition software and [employer] include unintentional dictation errors.    Cassis Waylon, MD 01/22/24 [id number]    Cassis Waylon, MD 01/22/24 [phone]

## 2024-01-22 NOTE — ED Notes (Signed)
 ED Provider at bedside.

## 2024-01-22 NOTE — ED Notes (Signed)
 First Nurse Note: Pt to ED via [facility] from home for shortness of breath and dizziness that are worse than her normal. Pt has hx/o fluid retention and is on Lasix . Pt reports recent changes in her medications but she is unsure what medications were changed. EMS reports pt is ambulatory with a cane at baseline. Pt is A & O x 4  SpO2: 95% RA HR: 50-60 hx/o A. Fib Bp: 136/78 20 G RAC

## 2024-01-22 NOTE — ED Notes (Signed)
 Patient to CT.

## 2024-01-22 NOTE — ED Triage Notes (Signed)
 Pt to ED via [facility] from home. Pt reports dizziness and SOB that started 1hr PTA. Pt reports numbness to right arm and right leg that lasted a few minutes. Pt is on lasix .

## 2024-01-23 ENCOUNTER — Observation Stay

## 2024-01-23 DIAGNOSIS — R200 Anesthesia of skin: Secondary | ICD-10-CM

## 2024-01-23 DIAGNOSIS — E785 Hyperlipidemia, unspecified: Secondary | ICD-10-CM | POA: Diagnosis not present

## 2024-01-23 DIAGNOSIS — I4891 Unspecified atrial fibrillation: Secondary | ICD-10-CM | POA: Diagnosis not present

## 2024-01-23 DIAGNOSIS — N179 Acute kidney failure, unspecified: Secondary | ICD-10-CM

## 2024-01-23 DIAGNOSIS — R29818 Other symptoms and signs involving the nervous system: Secondary | ICD-10-CM

## 2024-01-23 DIAGNOSIS — G9389 Other specified disorders of brain: Secondary | ICD-10-CM | POA: Diagnosis not present

## 2024-01-23 DIAGNOSIS — I10 Essential (primary) hypertension: Secondary | ICD-10-CM | POA: Diagnosis not present

## 2024-01-23 DIAGNOSIS — I2510 Atherosclerotic heart disease of native coronary artery without angina pectoris: Secondary | ICD-10-CM | POA: Diagnosis not present

## 2024-01-23 DIAGNOSIS — I6782 Cerebral ischemia: Secondary | ICD-10-CM | POA: Diagnosis not present

## 2024-01-23 DIAGNOSIS — I482 Chronic atrial fibrillation, unspecified: Secondary | ICD-10-CM | POA: Diagnosis not present

## 2024-01-23 DIAGNOSIS — I480 Paroxysmal atrial fibrillation: Secondary | ICD-10-CM | POA: Diagnosis not present

## 2024-01-23 DIAGNOSIS — I5030 Unspecified diastolic (congestive) heart failure: Secondary | ICD-10-CM | POA: Diagnosis not present

## 2024-01-23 LAB — RESP PANEL BY RT-PCR (RSV, FLU A&B, COVID)  RVPGX2
Influenza A by PCR: NEGATIVE
Influenza B by PCR: NEGATIVE
Resp Syncytial Virus by PCR: NEGATIVE
SARS Coronavirus 2 by RT PCR: NEGATIVE

## 2024-01-23 LAB — CBC
HCT: 42.7 % (ref 36.0–46.0)
Hemoglobin: 13.9 g/dL (ref 12.0–15.0)
MCH: 27.7 pg (ref 26.0–34.0)
MCHC: 32.6 g/dL (ref 30.0–36.0)
MCV: 85.2 fL (ref 80.0–100.0)
Platelets: 279 K/uL (ref 150–400)
RBC: 5.01 MIL/uL (ref 3.87–5.11)
RDW: 13.8 % (ref 11.5–15.5)
WBC: 7.5 K/uL (ref 4.0–10.5)
nRBC: 0 % (ref 0.0–0.2)

## 2024-01-23 LAB — COMPREHENSIVE METABOLIC PANEL WITH GFR
ALT: 13 U/L (ref 0–44)
AST: 19 U/L (ref 15–41)
Albumin: 3.6 g/dL (ref 3.5–5.0)
Alkaline Phosphatase: 65 U/L (ref 38–126)
Anion gap: 11 (ref 5–15)
BUN: 22 mg/dL (ref 8–23)
CO2: 26 mmol/L (ref 22–32)
Calcium: 8.7 mg/dL — ABNORMAL LOW (ref 8.9–10.3)
Chloride: 105 mmol/L (ref 98–111)
Creatinine, Ser: 1.13 mg/dL — ABNORMAL HIGH (ref 0.44–1.00)
GFR, Estimated: 50 mL/min — ABNORMAL LOW (ref >60)
Glucose, Bld: 107 mg/dL — ABNORMAL HIGH (ref 70–99)
Potassium: 3.6 mmol/L (ref 3.5–5.1)
Sodium: 142 mmol/L (ref 135–145)
Total Bilirubin: 0.5 mg/dL (ref 0.0–1.2)
Total Protein: 6.6 g/dL (ref 6.5–8.1)

## 2024-01-23 LAB — CBG MONITORING, ED: Glucose-Capillary: 114 mg/dL — ABNORMAL HIGH (ref 70–99)

## 2024-01-23 LAB — TSH: TSH: 3.144 u[IU]/mL (ref 0.350–4.500)

## 2024-01-23 LAB — TROPONIN I (HIGH SENSITIVITY): Troponin I (High Sensitivity): 10 ng/L (ref <18)

## 2024-01-23 MED ORDER — ACETAMINOPHEN 325 MG RE SUPP: 650.0000 mg | Freq: Four times a day (QID) | RECTAL | Status: DC | PRN

## 2024-01-23 MED ORDER — HYDROCOD POLI-CHLORPHE POLI ER 10-8 MG/5ML PO SUER: 5.0000 mL | Freq: Two times a day (BID) | ORAL | Status: DC | PRN

## 2024-01-23 MED ORDER — SODIUM CHLORIDE 0.9% FLUSH
3.0000 mL | Freq: Two times a day (BID) | INTRAVENOUS | Status: DC
  Administered 2024-01-23 (×2): 3 mL via INTRAVENOUS

## 2024-01-23 MED ORDER — ATORVASTATIN CALCIUM 20 MG PO TABS
40.0000 mg | ORAL_TABLET | Freq: Every day | ORAL | Status: DC
  Administered 2024-01-23: 40 mg via ORAL
  Filled 2024-01-23: qty 2

## 2024-01-23 MED ORDER — MECLIZINE HCL 12.5 MG PO TABS: 12.5000 mg | ORAL_TABLET | Freq: Two times a day (BID) | ORAL | 0 refills | Status: AC | PRN

## 2024-01-23 MED ORDER — PANTOPRAZOLE SODIUM 40 MG PO TBEC
40.0000 mg | DELAYED_RELEASE_TABLET | Freq: Every day | ORAL | Status: DC
  Administered 2024-01-23: 40 mg via ORAL
  Filled 2024-01-23: qty 1

## 2024-01-23 MED ORDER — HYDROCODONE-ACETAMINOPHEN 5-325 MG PO TABS: 1.0000 | ORAL_TABLET | ORAL | Status: DC | PRN

## 2024-01-23 MED ORDER — IPRATROPIUM-ALBUTEROL 0.5-2.5 (3) MG/3ML IN SOLN: 3.0000 mL | Freq: Four times a day (QID) | RESPIRATORY_TRACT | Status: DC | PRN

## 2024-01-23 MED ORDER — MIRABEGRON ER 25 MG PO TB24
25.0000 mg | ORAL_TABLET | Freq: Every day | ORAL | Status: DC
  Administered 2024-01-23: 25 mg via ORAL
  Filled 2024-01-23: qty 1

## 2024-01-23 MED ORDER — ENALAPRIL MALEATE 2.5 MG PO TABS
2.5000 mg | ORAL_TABLET | Freq: Every day | ORAL | Status: DC
  Administered 2024-01-23: 2.5 mg via ORAL
  Filled 2024-01-23: qty 1

## 2024-01-23 MED ORDER — INSULIN ASPART 100 UNIT/ML IJ SOLN: 0.0000 [IU] | Freq: Three times a day (TID) | INTRAMUSCULAR | Status: DC

## 2024-01-23 MED ORDER — ACETAMINOPHEN 325 MG PO TABS
650.0000 mg | ORAL_TABLET | Freq: Four times a day (QID) | ORAL | Status: DC | PRN
  Administered 2024-01-23: 650 mg via ORAL
  Filled 2024-01-23: qty 2

## 2024-01-23 MED ORDER — DAPAGLIFLOZIN PROPANEDIOL 10 MG PO TABS
10.0000 mg | ORAL_TABLET | Freq: Every day | ORAL | Status: DC
  Administered 2024-01-23: 10 mg via ORAL
  Filled 2024-01-23: qty 1

## 2024-01-23 MED ORDER — INSULIN ASPART 100 UNIT/ML IJ SOLN: 0.0000 [IU] | Freq: Every day | INTRAMUSCULAR | Status: DC

## 2024-01-23 MED ORDER — FLUTICASONE PROPIONATE 50 MCG/ACT NA SUSP
1.0000 | Freq: Every day | NASAL | Status: DC
  Filled 2024-01-23: qty 16

## 2024-01-23 MED ORDER — APIXABAN 5 MG PO TABS
5.0000 mg | ORAL_TABLET | Freq: Two times a day (BID) | ORAL | Status: DC
  Administered 2024-01-23: 5 mg via ORAL
  Filled 2024-01-23: qty 1

## 2024-01-23 NOTE — Assessment & Plan Note (Addendum)
 CT head negative Follow-up MRI Management as above under A-fib with slow ventricular response Continue aspirin  and statins, continue

## 2024-01-23 NOTE — Assessment & Plan Note (Addendum)
 Patient reports slurred speech and imbalance during the episode of lightheadedness She has persistent pins-and-needles feeling on the lateral left thigh CT head nonacute Continue statins and antiplatelets MRI brain wo contrast: No acute intracranial abnormality.  Mild to moderate chronic microvascular ischemic disease.  Few small foci of encephalomalacia involving right frontal and temporal cortices reflecting prior traumatic brain injury.

## 2024-01-23 NOTE — ED Notes (Signed)
 Pt walked to bathroom and back by herself. Back in bed.

## 2024-01-23 NOTE — Assessment & Plan Note (Signed)
 Continue home meds except for rate control agents

## 2024-01-23 NOTE — Assessment & Plan Note (Addendum)
 Patient reports frequent urination since being started on furosemide  a couple weeks prior Creatinine 1.15, baseline 0.96 Possibility of mild dehydration related to diuretics  Mildly improved today.  Hold home metolazone  2.5 mg until follow-up with primary care provider.  Home furosemide  20 mg daily can be resumed.

## 2024-01-23 NOTE — Assessment & Plan Note (Signed)
 Morbid obesity with BMI 51.49 Complicating factors overall prognosis and care CPAP nightly

## 2024-01-23 NOTE — Assessment & Plan Note (Signed)
 Not acutely exacerbated Continue DuoNebs, Tussionex, Flonase 

## 2024-01-23 NOTE — Assessment & Plan Note (Signed)
 Symptomatic bradycardia-postural dizziness with presyncope Patient not currently on rate control agents CT head negative Follow-up MRI to evaluate for CVA Continuous cardiac monitoring, echo if not done within the last 6 months Neurologic checks and fall precautions

## 2024-01-23 NOTE — Assessment & Plan Note (Signed)
 Continue meds pending verification We will check TSH in the setting of symptomatic bradycardia

## 2024-01-23 NOTE — Hospital Course (Addendum)
 [name] is a 78 year old female with history of traumatic brain injury with encephalomalacia, hyperlipidemia, hypertension, atrial fibrillation on Eliquis , GERD, who presents to the ED for chief concerns of near syncope, shortness of breath, dizziness.  01/22/2024: Patient was admitted under hospital service for chief concerns of near syncope, symptomatic bradycardia.  Complete echo was ordered.  01/23/2024: Pending cardiology evaluation for possible symptomatic bradycardia, sick sinus syndrome and consideration of pacemaker placement.  Addendum: Communicated with cardiology, and per cardiology: No indication for pacemaker at this time. She does had an echo with her primary cardiologist last month with preserved EF, do not need to repeat at this time.  Avoid any AV nodal blockers when she goes home.  Patient discharged home.  As needed meclizine ordered.  Continue outpatient follow-up with PCP for resuming metolazone  and gabapentin .

## 2024-01-23 NOTE — Consult Note (Signed)
 [facility] CLINIC CARDIOLOGY CONSULT NOTE       Patient ID: Patricia Walker MRN: [id number] DOB/AGE: Nov 23, 1945 78 y.o.  Admit date: 01/22/2024 Referring Physician Dr. Delayne Solian Primary Physician [name], FNP  Primary Cardiologist Dr. Fernand Reason for Consultation AF SVR  HPI: Patricia Walker is a 78 y.o. female  with a past medical history of chronic atrial fibrillation on Eliquis , chronic HFpEF, coronary artery disease, asthma/COPD, OSA on CPAP, hypertension, esophageal stricture s/p dilatation, hypothyroidism, morbid obesity who presented to the ED on 01/22/2024 for dizziness, slurred speech. Cardiology was consulted for further evaluation.   Patient states that yesterday she had onset of weakness and dizziness with associated slurred speech.  Given that she decided to come to the ED for further evaluation.  Workup in the ED notable for creatinine 1.15, potassium 3.8, hemoglobin 14.2, WBC 7.5. Troponins 11 > 10, BNP 144. EKG in the ED atrial fibrillation SVR rate 46 bpm.  CT head and MRI brain negative for acute intracranial abnormality.    At the time my evaluation this morning patient is sitting upright on side of ED stretcher.  We discussed her symptoms in further detail.  She endorses weakness and dizziness that began yesterday around 2 PM.  States that with this she also noted slow, slurred speech and left leg numbness/tingling.  States that overall she is feeling some better today.  Heart rate in the 60-70s on telemetry at the time of my evaluation.  Has been up walking around without any significant issues.  Denies any true loss of consciousness or other presyncopal symptoms.  On her medication list it was noted that she is prescribed metoprolol , however patient is unsure if she is taking this or not as it is not in her bag of medications she has at bedside but she does report that she has other medications at home that she also takes.  Review of systems complete and found to be  negative unless listed above    Past Medical History:  Diagnosis Date   Anxiety    Arthritis    Asthma    COPD exacerbation (HCC) 10/08/2020   Coronary artery disease    mild, nonobstructive   Depression    Dyspnea    on exertion   Dysrhythmia    Atrial Fibrillation   GERD (gastroesophageal reflux disease)    Heart murmur    Hyperlipidemia    Hypertension    Hypothyroidism    MI, old 2016   nonobstructive CAD by Cath   Morbid obesity (HCC)    Persistent atrial fibrillation (HCC)    Pneumonia 06/2018   and RSV   Psoriasis    Sleep apnea    compliant with CPAP   Thyroid  disease     Past Surgical History:  Procedure Laterality Date   ARTERY BIOPSY Right 07/21/2017   Procedure: BIOPSY TEMPORAL ARTERY;  Surgeon: Jama Cordella MATSU, MD;  Location: [facility];  Service: Vascular;  Laterality: Right;   CARDIAC CATHETERIZATION     CARDIOVERSION Right 09/01/2016   Procedure: Cardioversion;  Surgeon: Fernand Denyse LABOR, MD;  Location: [facility];  Service: Cardiovascular;  Laterality: Right;   CARDIOVERSION N/A 09/09/2016   Procedure: Cardioversion;  Surgeon: Fernand Denyse LABOR, MD;  Location: [facility];  Service: Cardiovascular;  Laterality: N/A;   CATARACT EXTRACTION W/PHACO Right 03/26/2019   Procedure: CATARACT EXTRACTION PHACO AND INTRAOCULAR LENS PLACEMENT (IOC) RIGHT 6.45, 00:39.9;  Surgeon: Jaye Fallow, MD;  Location: [facility];  Service: Ophthalmology;  Laterality: Right;   CATARACT EXTRACTION W/PHACO Left 04/16/2019   Procedure: CATARACT EXTRACTION PHACO AND INTRAOCULAR LENS PLACEMENT (IOC) LEFT;   3.14, 00:24.9;  Surgeon: Jaye Fallow, MD;  Location: [facility];  Service: Ophthalmology;  Laterality: Left;  sleep apnea-CPAP   COLONOSCOPY WITH PROPOFOL  N/A 08/28/2019   Procedure: COLONOSCOPY WITH PROPOFOL ;  Surgeon: Toledo, Ladell POUR, MD;  Location: [facility];  Service: Gastroenterology;  Laterality: N/A;   ELECTROPHYSIOLOGIC STUDY N/A 02/01/2016    Procedure: CARDIOVERSION;  Surgeon: Denyse DELENA Bathe, MD;  Location: [facility];  Service: Cardiovascular;  Laterality: N/A;   ESOPHAGEAL DILATION     ESOPHAGOGASTRODUODENOSCOPY (EGD) WITH PROPOFOL  N/A 02/06/2017   Procedure: ESOPHAGOGASTRODUODENOSCOPY (EGD) WITH PROPOFOL ;  Surgeon: Therisa Bi, MD;  Location: [facility];  Service: Gastroenterology;  Laterality: N/A;   ESOPHAGOGASTRODUODENOSCOPY (EGD) WITH PROPOFOL  N/A 08/28/2019   Procedure: ESOPHAGOGASTRODUODENOSCOPY (EGD) WITH PROPOFOL ;  Surgeon: Toledo, Ladell POUR, MD;  Location: [facility];  Service: Gastroenterology;  Laterality: N/A;   EUS N/A 02/16/2017   Procedure: FULL UPPER ENDOSCOPIC ULTRASOUND (EUS) RADIAL;  Surgeon: Elta Fonda SQUIBB, MD;  Location: [facility];  Service: Gastroenterology;  Laterality: N/A;   LEFT HEART CATH AND CORONARY ANGIOGRAPHY N/A 09/08/2016   Procedure: Left Heart Cath and Coronary Angiography;  Surgeon: Bathe Denyse DELENA, MD;  Location: [facility];  Service: Cardiovascular;  Laterality: N/A;   US  ECHOCARDIOGRAPHY      (Not in a hospital admission)  Social History   Socioeconomic History   Marital status: Divorced    Spouse name: Not on file   Number of children: Not on file   Years of education: Not on file   Highest education level: Not on file  Occupational History   Not on file  Tobacco Use   Smoking status: Former    Current packs/day: 0.00    Types: Cigarettes    Quit date: 02/01/2013    Years since quitting: 10.9   Smokeless tobacco: Never  Vaping Use   Vaping status: Never Used  Substance and Sexual Activity   Alcohol use: No   Drug use: No   Sexual activity: Not on file  Other Topics Concern   Not on file  Social History Narrative   Lives in [place] with multiple family members   unemployed   Social Drivers of Health   Financial Resource Strain: Low Risk  (10/23/2023)   Received from [facility]   Overall Financial Resource Strain (CARDIA)     Difficulty of Paying Living Expenses: Not very hard  Food Insecurity: No Food Insecurity (11/10/2023)   Hunger Vital Sign    Worried About Running Out of Food in the Last Year: Never true    Ran Out of Food in the Last Year: Never true  Transportation Needs: No Transportation Needs (11/10/2023)   PRAPARE - Administrator, Civil Service (Medical): No    Lack of Transportation (Non-Medical): No  Physical Activity: Not on file  Stress: Not on file  Social Connections: Moderately Integrated (11/05/2023)   Social Connection and Isolation Panel    Frequency of Communication with Friends and Family: More than three times a week    Frequency of Social Gatherings with Friends and Family: More than three times a week    Attends Religious Services: More than 4 times per year    Active Member of [employer] or Organizations: Not on file    Attends Banker Meetings: 1 to 4 times per year  Marital Status: Divorced  Catering manager Violence: Not At Risk (11/10/2023)   Humiliation, Afraid, Rape, and Kick questionnaire    Fear of Current or Ex-Partner: No    Emotionally Abused: No    Physically Abused: No    Sexually Abused: No    Family History  Problem Relation Age of Onset   Breast cancer Paternal Aunt    Other Father    Heart attack Father      Vitals:   01/23/24 0800 01/23/24 0830 01/23/24 0845 01/23/24 1025  BP: 120/74     Pulse: (!) 58 63 79   Resp: 18 14 13    Temp:    98.4 F (36.9 C)  TempSrc:    Oral  SpO2: 97% 99% 96%   Weight:      Height:        PHYSICAL EXAM General: Chronically ill-appearing elderly female, well nourished, in no acute distress. HEENT: Normocephalic and atraumatic. Neck: No JVD.  Lungs: Normal respiratory effort on room air. Clear bilaterally to auscultation. No wheezes, crackles, rhonchi.  Heart: Irregularly irregular, controlled rate. Normal S1 and S2 without gallops or murmurs.  Abdomen: Non-distended appearing.  Msk: Normal strength  and tone for age. Extremities: Warm and well perfused. No clubbing, cyanosis.  No edema.  Neuro: Alert and oriented X 3. Psych: Answers questions appropriately.   Labs: Basic Metabolic Panel: Recent Labs    01/22/24 1731 01/23/24 0555  NA 141 142  K 3.8 3.6  CL 103 105  CO2 26 26  GLUCOSE 76 107*  BUN 20 22  CREATININE 1.15* 1.13*  CALCIUM  8.5* 8.7*   Liver Function Tests: Recent Labs    01/22/24 1731 01/23/24 0555  AST 18 19  ALT 13 13  ALKPHOS 60 65  BILITOT 0.9 0.5  PROT 6.6 6.6  ALBUMIN 3.3* 3.6   No results for input(s): LIPASE, AMYLASE in the last 72 hours. CBC: Recent Labs    01/22/24 1731 01/23/24 0555  WBC 7.5 7.5  HGB 14.2 13.9  HCT 43.2 42.7  MCV 84.9 85.2  PLT 304 279   Cardiac Enzymes: Recent Labs    01/22/24 1731 01/22/24 2357  TROPONINIHS 11 10   BNP: Recent Labs    01/22/24 1731  BNP 144.7*   D-Dimer: No results for input(s): DDIMER in the last 72 hours. Hemoglobin A1C: No results for input(s): HGBA1C in the last 72 hours. Fasting Lipid Panel: No results for input(s): CHOL, HDL, LDLCALC, TRIG, CHOLHDL, LDLDIRECT in the last 72 hours. Thyroid  Function Tests: Recent Labs    01/22/24 2357  TSH 3.144   Anemia Panel: No results for input(s): VITAMINB12, FOLATE, FERRITIN, TIBC, IRON, RETICCTPCT in the last 72 hours.   Radiology: MR BRAIN WO CONTRAST Result Date: 01/23/2024 EXAM: MR Brain without Intravenous Contrast. CLINICAL HISTORY: Neuro deficit, acute, stroke suspected. 78 y.o. female with a history of COPD, CAD, A-fib on Eliquis , hypertension, and GERD who presents with dizziness, acute onset this afternoon, described as feeling very lightheaded and weak. She had associated shortness of breath. In addition she started to have numbness and tingling in the left leg, and then in both arms. She denies any weakness in her arms or legs. She denies any headache. She has no fever. She has no vomiting or  diarrhea. The patient states that symptoms have somewhat improved over the last several hours while she waited, but she still is not feeling right. TECHNIQUE: Magnetic resonance images of the brain without intravenous contrast in multiple planes.  CONTRAST: Without. COMPARISON: None provided. FINDINGS: BRAIN: No restricted diffusion to indicate acute infarction. No intracranial mass or hemorrhage. No midline shift or extra-axial fluid collection. No cerebellar tonsillar ectopia. The central arterial and venous flow voids are patent. Patchy T2 FLAIR hypertensity involving the periventricular and deep white matter bolus hemispheres, consistent with chronic cervical ischemic disease, mild to moderate in nature. 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 VENTRICLES: No hydrocephalus. ORBITS: Prior bilateral ocular lens replacement. SINUSES AND MASTOIDS: The sinuses and mastoid air cells are clear. BONES: No acute fracture or focal osseous lesion. IMPRESSION: 1. No acute intracranial abnormality. 2. Mild to moderate chronic microvascular ischemic disease. 3. 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 Electronically signed by: Morene Hoard MD 01/23/2024 02:54 AM EDT RP Workstation: [id number]   CT Head Wo Contrast Result Date: 01/22/2024 CLINICAL DATA:  Neuro deficit, acute, stroke suspected EXAM: CT HEAD WITHOUT CONTRAST TECHNIQUE: Contiguous axial images were obtained from the base of the skull through the vertex without intravenous contrast. RADIATION DOSE REDUCTION: This exam was performed according to the departmental dose-optimization program which includes automated exposure control, adjustment of the mA and/or kV according to patient size and/or use of iterative reconstruction technique. COMPARISON:   02/02/2023 FINDINGS: Brain: There is atrophy and chronic small vessel disease changes. No acute intracranial abnormality. Specifically, no hemorrhage, hydrocephalus, mass lesion, acute infarction, or significant intracranial injury. Vascular: No hyperdense vessel or unexpected calcification. Skull: No acute calvarial abnormality. Sinuses/Orbits: No acute findings Other: None IMPRESSION: Atrophy, chronic microvascular disease. No acute intracranial abnormality. Electronically Signed   By: Franky Crease M.D.   On: 01/22/2024 22:40   DG Chest 2 View Result Date: 01/22/2024 CLINICAL DATA:  Shortness of breath EXAM: CHEST - 2 VIEW COMPARISON:  11/05/2023 FINDINGS: Cardiomegaly. No overt edema, confluent opacities or effusions. No acute bony abnormality. IMPRESSION: Cardiomegaly.  No active disease. Electronically Signed   By: Franky Crease M.D.   On: 01/22/2024 22:02   PCV ECHOCARDIOGRAM COMPLETE Result Date: 01/01/2024 Images from the original result were not included. Reason for Visit Echocardiogram  INDICATIONS:   Echocardiogram: An echocardiogram in (2-d) mode was performed and in Doppler mode with color flow velocity mapping was performed. ventricular septum thickness 1.57 cm, L ventricular posterior wall thickness (diastole) 1.23 cm, left atrium size 4.8 cm, right atrium 2.8 cm, aortic root diameter 2.8 cm, L ventricle diastolic dimension 5.7 cm, L ventricle systolic dimension 3.91, L ventricle ejection fraction 58 %, and LV fractional shortening 21.4 % L ventricular outflow tract internal diameter 1.7 cm, L ventricular outflow tract flow velocity 1.17 m/s, aortic valve cusps 1.9 cm , aortic valve flow velocity 1.45 (m/sec), aortic valve systolic calculated mean flow gradient 4 mmHg, and mitral valve diastolic peak flow velocity E 8.77 m/sec Mitral valve has mild regurgitation Aortic valve has  trace regurgitation Tricuspid valve has mild regurgitation ASSESSMENT Technically difficult study due to body  habitus. Suboptimal study due to poor windows. Left Atrium Severely Enlarged Normal left ventricular systolic function. Moderate left ventricular hypertrophy Normal right ventricular systolic function. Normal right ventricular diastolic function. Normal left ventricular wall motion. Normal right ventricular wall motion. Mild tricuspid regurgitation. Mild pulmonary hypertension. Mild mitral regurgitation. Trace aortic regurgitation. No pericardial effusion. Unable to determine LV diastolic dysfunction due to patient being in Atrial Fibrillation.    ECHO 12/2023: ASSESSMENT  Technically difficult study due to body habitus.  Suboptimal study due to poor windows.  Left Atrium Severely Enlarged  Normal left ventricular systolic function.  Moderate left ventricular hypertrophy  Normal right ventricular systolic function.  Normal right ventricular diastolic function.  Normal left ventricular wall motion.  Normal right ventricular wall motion.  Mild tricuspid regurgitation.  Mild pulmonary hypertension.  Mild mitral regurgitation.  Trace aortic regurgitation.  No pericardial effusion.  Unable to determine LV diastolic dysfunction due to patient being in  Atrial Fibrillation.    TELEMETRY reviewed by me 01/23/2024: Atrial fibrillation rate 60-70s  EKG reviewed by me: atrial fibrillation SVR rate 46 bpm  Data reviewed by me 01/23/2024: last 24h vitals tele labs imaging I/O ED provider note, admission H&P  Principal Problem:   Atrial fibrillation with slow ventricular response (HCC) Active Problems:   Coronary artery disease   Hypertension   Obstructive sleep apnea syndrome   Morbid obesity with BMI of 50.0-59.9, adult (HCC)   Chronic heart failure with preserved ejection fraction (HFpEF) (HCC)   Hypothyroidism (acquired)   Chronic obstructive pulmonary disease (HCC)   Symptomatic bradycardia   Postural dizziness with presyncope   Focal neurological deficit   AKI (acute kidney injury),  possible    ASSESSMENT AND PLAN:  Argusta Mcgann Meloy is a 78 y.o. female  with a past medical history of chronic atrial fibrillation on Eliquis , chronic HFpEF, coronary artery disease, asthma/COPD, OSA on CPAP, hypertension, esophageal stricture s/p dilatation, hypothyroidism, morbid obesity who presented to the ED on 01/22/2024 for dizziness, slurred speech. Cardiology was consulted for further evaluation.   # Atrial fibrillation SVR # Chronic atrial fibrillation # Chronic HFpEF # Coronary artery disease # Hypertension # Hyperlipidemia Patient presented with complaints of dizziness, slurred speech.  Heart rate noted to be slow, chronic atrial fibrillation.  CT head and MRI brain without acute abnormality.  Placed on telemetry for monitoring. - No evidence of high-grade AV block or significant bradycardia on telemetry.  ?Taking metoprolol  at home.  Avoid AV nodal blockers on discharge.  Consider outpatient monitoring for additional evaluation.  No indication for pacemaker at this time. - Continue Eliquis  5 mg twice daily for stroke risk reduction. - Continue Farxiga  10 mg daily, enalapril  2.5 mg daily. - Continue atorvastatin  40 mg daily.  This patient's plan of care was discussed and created with Dr. Florencio and he is in agreement.  Signed: Danita Bloch, PA-C  01/23/2024, 10:56 AM [facility] Cardiology

## 2024-01-23 NOTE — Assessment & Plan Note (Signed)
 Continue outpatient follow-up with cardiology as appropriate

## 2024-01-23 NOTE — ED Notes (Signed)
 Patient has lunch, was given graham crackers and had a banana from breakfast. Patient is sitting upright on side of the stretcher.

## 2024-01-23 NOTE — ED Notes (Signed)
 Patient transported to MRI

## 2024-01-23 NOTE — Assessment & Plan Note (Addendum)
 Evaluated by cardiology, and cardiology team does not feel cardiac is the etiology for patient's dizziness and near syncope. I ordered meclizine 12.5 mg p.o. twice daily as needed for dizziness, vertigo symptoms, prescription sent to pharmacy, 10 tablets supply  MRI is negative.  Recommend outpatient follow-up with PCP if persists.  Patient reports she gets dizzy about once a month.

## 2024-01-23 NOTE — ED Notes (Signed)
Pt provided phone to call family.

## 2024-01-23 NOTE — ED Notes (Signed)
 Messaged pharmacy for 10am meds.

## 2024-01-23 NOTE — Assessment & Plan Note (Signed)
 CAD Both conditions stable Holding metoprolol  due to symptomatic bradycardia Continue GDMT with enalapril , Farxiga ,  atorvastatin .  Continue apixaban 

## 2024-01-23 NOTE — Discharge Summary (Addendum)
 Physician Discharge Summary   Patient: Patricia Walker MRN: [id number] DOB: Oct 10, 1945  Admit date:     01/22/2024  Discharge date: 01/23/24  Discharge Physician: Dr. Sherre   PCP: [name], FNP   Recommendations at discharge:   Hold home metolazone  and gabapentin  until follow-up with outpatient PCP for continuation. Meclizine as needed for dizziness and vertigo ordered. Follow-up with PCP as appropriate  Discharge Diagnoses: Principal Problem:   Atrial fibrillation with slow ventricular response (HCC) Active Problems:   Coronary artery disease   Symptomatic bradycardia   Postural dizziness with presyncope   Focal neurological deficit   Obstructive sleep apnea syndrome   Chronic heart failure with preserved ejection fraction (HFpEF) (HCC)   Chronic obstructive pulmonary disease (HCC)   Hypertension   Morbid obesity with BMI of 50.0-59.9, adult (HCC)   Hypothyroidism (acquired)   AKI (acute kidney injury), possible  Resolved Problems:   * No resolved hospital problems. *  Hospital Course: Patricia Walker is a 78 year old female with history of traumatic brain injury with encephalomalacia, hyperlipidemia, hypertension, atrial fibrillation on Eliquis , GERD, who presents to the ED for chief concerns of near syncope, shortness of breath, dizziness.  01/22/2024: Patient was admitted under hospital service for chief concerns of near syncope, symptomatic bradycardia.  Complete echo was ordered.  01/23/2024: Pending cardiology evaluation for possible symptomatic bradycardia, sick sinus syndrome and consideration of pacemaker placement.  Addendum: Communicated with cardiology, and per cardiology: No indication for pacemaker at this time. She does had an echo with her primary cardiologist last month with preserved EF, do not need to repeat at this time.  Avoid any AV nodal blockers when she goes home.  Patient discharged home.  As needed meclizine ordered.  Continue outpatient  follow-up with PCP for resuming metolazone  and gabapentin .  Assessment and Plan:  * Atrial fibrillation with slow ventricular response (HCC) Symptomatic bradycardia-postural dizziness with presyncope versus sick sinus syndrome Patient not currently on rate control agents CT head negative Follow-up MRI to evaluate for CVA Continuous cardiac monitoring, echo if not done within the last 6 months Neurologic checks and fall precautions Continue apixaban  Consulting patient's primary cardiologist, Dr. Fernand Complete echo ordered, [facility]  Received message from cardiology team, they evaluated the patient and reviewed her bradycardia.  Per cardiology team, no indication for pacemaker at this time and they do not recommend an additional complete echo as a complete echo was done with patient's cardiology in September 2025 already.  Focal neurological deficit Patient reports slurred speech and imbalance during the episode of lightheadedness She has persistent pins-and-needles feeling on the lateral left thigh CT head nonacute Continue statins and antiplatelets MRI brain wo contrast: No acute intracranial abnormality.  Mild to moderate chronic microvascular ischemic disease.  Few small foci of encephalomalacia involving right frontal and temporal cortices reflecting prior traumatic brain injury.  Postural dizziness with presyncope Evaluated by cardiology, and cardiology team does not feel cardiac is the etiology for patient's dizziness and near syncope. I ordered meclizine 12.5 mg p.o. twice daily as needed for dizziness, vertigo symptoms, prescription sent to pharmacy, 10 tablets supply  MRI is negative.  Recommend outpatient follow-up with PCP if persists.  Patient reports she gets dizzy about once a month.  Symptomatic bradycardia Continue outpatient follow-up with cardiology as appropriate  Chronic obstructive pulmonary disease (HCC) Not acutely exacerbated Continue  DuoNebs, Tussionex, Flonase   Chronic heart failure with preserved ejection fraction (HFpEF) (HCC) CAD Both conditions stable Holding metoprolol  due to  symptomatic bradycardia Continue GDMT with enalapril , Farxiga ,  atorvastatin .  Continue apixaban   Obstructive sleep apnea syndrome Morbid obesity with BMI 51.49 Complicating factors overall prognosis and care CPAP nightly  Hypothyroidism (acquired) Continue meds pending verification We will check TSH in the setting of symptomatic bradycardia  Hypertension Continue home meds except for rate control agents  AKI (acute kidney injury), possible Patient reports frequent urination since being started on furosemide  a couple weeks prior Creatinine 1.15, baseline 0.96 Possibility of mild dehydration related to diuretics  Mildly improved today.  Hold home metolazone  2.5 mg until follow-up with primary care provider.  Home furosemide  20 mg daily can be resumed.  Pain control - [place]  Controlled Substance Reporting System database was reviewed. and patient was instructed, not to drive, operate heavy machinery, perform activities at heights, swimming or participation in water activities or provide baby-sitting services while on Pain, Sleep and Anxiety Medications; until their outpatient Physician has advised to do so again. Also recommended to not to take more than prescribed Pain, Sleep and Anxiety Medications.  Consultants: Cardiology Procedures performed: None at this time Disposition: Home Diet recommendation:  Cardiac diet DISCHARGE MEDICATION: Allergies as of 01/23/2024       Reactions   Codeine Hives, Itching, Rash   Other Itching, Other (See Comments)   States antibiotic in the past caused itching but can not remember name        Medication List     PAUSE taking these medications    gabapentin  300 MG capsule Wait to take this until your doctor or other care provider tells you to start again. Commonly known as:  NEURONTIN  Take 300 mg by mouth daily.   metolazone  2.5 MG tablet Wait to take this until your doctor or other care provider tells you to start again. Commonly known as: ZAROXOLYN  Take 1 tablet (2.5 mg total) by mouth daily for 3 days.       TAKE these medications    acetaminophen  325 MG tablet Commonly known as: TYLENOL  Take 2 tablets (650 mg total) by mouth every 6 (six) hours as needed for mild pain (pain score 1-3) or moderate pain (pain score 4-6).   albuterol  108 (90 Base) MCG/ACT inhaler Commonly known as: VENTOLIN  HFA INHALE 1-2 PUFFS EVERY 4-6 HOURS AS NEEDED FOR SHORTNESS OF BREATH   atorvastatin  40 MG tablet Commonly known as: LIPITOR ONCE A DAY FOR CHOLESTEROL   chlorpheniramine-HYDROcodone  10-8 MG/5ML Commonly known as: TUSSIONEX Take 5 mLs by mouth every 12 (twelve) hours as needed for cough.   dapagliflozin  propanediol 10 MG Tabs tablet Commonly known as: Farxiga  Take 1 tablet (10 mg total) by mouth daily before breakfast.   Eliquis  5 MG Tabs tablet Generic drug: apixaban  TAKE 1 TABLET BY MOUTH TWICE A DAY   enalapril  2.5 MG tablet Commonly known as: VASOTEC  TAKE 1 TABLET BY MOUTH EVERY DAY   esomeprazole 40 MG capsule Commonly known as: NEXIUM TAKE 1 CAPSULE BY MOUTH EVERY DAY IN THE MORNING   fluticasone  50 MCG/ACT nasal spray Commonly known as: FLONASE  Place 1 spray into both nostrils daily.   furosemide  40 MG tablet Commonly known as: LASIX  Take 0.5 tablets (20 mg total) by mouth daily.   Gemtesa  75 MG Tabs Generic drug: Vibegron  Take 1 tablet (75 mg total) by mouth daily.   ipratropium-albuterol  0.5-2.5 (3) MG/3ML Soln Commonly known as: DUONEB Take 3 mLs by nebulization every 6 (six) hours as needed.   meclizine 12.5 MG tablet Commonly known as: ANTIVERT Take 1  tablet (12.5 mg total) by mouth 2 (two) times daily as needed for dizziness.   meloxicam 15 MG tablet Commonly known as: MOBIC Take 15 mg by mouth daily.   Mounjaro  2.5  MG/0.5ML Pen Generic drug: tirzepatide  Inject 2.5 mg into the skin once a week.   nystatin  powder Commonly known as: nystatin  Apply 1 Application topically 3 (three) times daily as needed. Irritation under breast and groin area   Taltz 80 MG/ML pen Generic drug: ixekizumab Inject 80 mg into the skin every 28 (twenty-eight) days.        Follow-up Information     Fernand Denyse LABOR, MD. Go in 1 week(s).   Specialty: Cardiology Contact information: [address] [place] [address] [phone]               Discharge Exam: Patricia Walker   01/22/24 1726  Weight: 136.1 kg  Temperature 98.4, respiration rate 17, heart rate 60, blood pressure 116/78, SpO2 96% on room air.  Physical Exam Vitals reviewed.  Constitutional:      Appearance: She is well-developed. She is obese.  HENT:     Head: Normocephalic and atraumatic.     Mouth/Throat:     Mouth: Mucous membranes are moist.  Eyes:     Extraocular Movements: Extraocular movements intact.     Pupils: Pupils are equal, round, and reactive to light.  Cardiovascular:     Rate and Rhythm: Normal rate and regular rhythm.  Pulmonary:     Effort: Pulmonary effort is normal.     Breath sounds: Normal breath sounds.  Abdominal:     Palpations: Abdomen is soft.     Comments: Morbidly obese abdomen  Musculoskeletal:        General: Normal range of motion.     Cervical back: Normal range of motion and neck supple.  Skin:    General: Skin is warm and dry.     Capillary Refill: Capillary refill takes less than 2 seconds.  Neurological:     General: No focal deficit present.     Mental Status: She is alert and oriented to person, place, and time.  Psychiatric:        Mood and Affect: Mood normal.        Behavior: Behavior normal.   Condition at discharge: good  The results of significant diagnostics from this hospitalization (including imaging, microbiology, ancillary and laboratory) are listed below for reference.    Imaging Studies: MR BRAIN WO CONTRAST Result Date: 01/23/2024 EXAM: MR Brain without Intravenous Contrast. CLINICAL HISTORY: Neuro deficit, acute, stroke suspected. 78 y.o. female with a history of COPD, CAD, A-fib on Eliquis , hypertension, and GERD who presents with dizziness, acute onset this afternoon, described as feeling very lightheaded and weak. She had associated shortness of breath. In addition she started to have numbness and tingling in the left leg, and then in both arms. She denies any weakness in her arms or legs. She denies any headache. She has no fever. She has no vomiting or diarrhea. The patient states that symptoms have somewhat improved over the last several hours while she waited, but she still is not feeling right. TECHNIQUE: Magnetic resonance images of the brain without intravenous contrast in multiple planes. CONTRAST: Without. COMPARISON: None provided. FINDINGS: BRAIN: No restricted diffusion to indicate acute infarction. No intracranial mass or hemorrhage. No midline shift or extra-axial fluid collection. No cerebellar tonsillar ectopia. The central arterial and venous flow voids are patent. Patchy T2 FLAIR hypertensity involving the periventricular  and deep white matter bolus hemispheres, consistent with chronic cervical ischemic disease, mild to moderate in nature. 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 VENTRICLES: No hydrocephalus. ORBITS: Prior bilateral ocular lens replacement. SINUSES AND MASTOIDS: The sinuses and mastoid air cells are clear. BONES: No acute fracture or focal osseous lesion. IMPRESSION: 1. No acute intracranial abnormality. 2. Mild to moderate chronic microvascular ischemic disease. 3. 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 Electronically signed by:  Morene Hoard MD 01/23/2024 02:54 AM EDT RP Workstation: [id number]   CT Head Wo Contrast Result Date: 01/22/2024 CLINICAL DATA:  Neuro deficit, acute, stroke suspected EXAM: CT HEAD WITHOUT CONTRAST TECHNIQUE: Contiguous axial images were obtained from the base of the skull through the vertex without intravenous contrast. RADIATION DOSE REDUCTION: This exam was performed according to the departmental dose-optimization program which includes automated exposure control, adjustment of the mA and/or kV according to patient size and/or use of iterative reconstruction technique. COMPARISON:  02/02/2023 FINDINGS: Brain: There is atrophy and chronic small vessel disease changes. No acute intracranial abnormality. Specifically, no hemorrhage, hydrocephalus, mass lesion, acute infarction, or significant intracranial injury. Vascular: No hyperdense vessel or unexpected calcification. Skull: No acute calvarial abnormality. Sinuses/Orbits: No acute findings Other: None IMPRESSION: Atrophy, chronic microvascular disease. No acute intracranial abnormality. Electronically Signed   By: Franky Crease M.D.   On: 01/22/2024 22:40   DG Chest 2 View Result Date: 01/22/2024 CLINICAL DATA:  Shortness of breath EXAM: CHEST - 2 VIEW COMPARISON:  11/05/2023 FINDINGS: Cardiomegaly. No overt edema, confluent opacities or effusions. No acute bony abnormality. IMPRESSION: Cardiomegaly.  No active disease. Electronically Signed   By: Franky Crease M.D.   On: 01/22/2024 22:02   PCV ECHOCARDIOGRAM COMPLETE Result Date: 01/01/2024 Images from the original result were not included. Reason for Visit Echocardiogram  INDICATIONS:   Echocardiogram: An echocardiogram in (2-d) mode was performed and in Doppler mode with color flow velocity mapping was performed. ventricular septum thickness 1.57 cm, L ventricular posterior wall thickness (diastole) 1.23 cm, left atrium size 4.8 cm, right atrium 2.8 cm, aortic root diameter 2.8 cm, L  ventricle diastolic dimension 5.7 cm, L ventricle systolic dimension 3.91, L ventricle ejection fraction 58 %, and LV fractional shortening 21.4 % L ventricular outflow tract internal diameter 1.7 cm, L ventricular outflow tract flow velocity 1.17 m/s, aortic valve cusps 1.9 cm , aortic valve flow velocity 1.45 (m/sec), aortic valve systolic calculated mean flow gradient 4 mmHg, and mitral valve diastolic peak flow velocity E 8.77 m/sec Mitral valve has mild regurgitation Aortic valve has  trace regurgitation Tricuspid valve has mild regurgitation ASSESSMENT Technically difficult study due to body habitus. Suboptimal study due to poor windows. Left Atrium Severely Enlarged Normal left ventricular systolic function. Moderate left ventricular hypertrophy Normal right ventricular systolic function. Normal right ventricular diastolic function. Normal left ventricular wall motion. Normal right ventricular wall motion. Mild tricuspid regurgitation. Mild pulmonary hypertension. Mild mitral regurgitation. Trace aortic regurgitation. No pericardial effusion. Unable to determine LV diastolic dysfunction due to patient being in Atrial Fibrillation.   Microbiology: Results for orders placed or performed during the hospital encounter of 01/22/24  Resp panel by RT-PCR (RSV, Flu A&B, Covid) Anterior Nasal Swab     Status: None   Collection Time: 01/22/24 11:57 PM   Specimen: Anterior Nasal Swab  Result Value Ref Range Status  SARS Coronavirus 2 by RT PCR NEGATIVE NEGATIVE Final    Comment: (NOTE) SARS-CoV-2 target nucleic acids are NOT DETECTED.  The SARS-CoV-2 RNA is generally detectable in upper respiratory specimens during the acute phase of infection. The lowest concentration of SARS-CoV-2 viral copies this assay can detect is 138 copies/mL. A negative result does not preclude SARS-Cov-2 infection and should not be used as the sole basis for treatment or other patient management decisions. A negative result  Liese occur with  improper specimen collection/handling, submission of specimen other than nasopharyngeal swab, presence of viral mutation(s) within the areas targeted by this assay, and inadequate number of viral copies(<138 copies/mL). A negative result must be combined with clinical observations, patient history, and epidemiological information. The expected result is Negative.  Fact Sheet for Patients:  BloggerCourse.com  Fact Sheet for Healthcare Providers:  SeriousBroker.it  This test is no t yet approved or cleared by the [place]  FDA and  has been authorized for detection and/or diagnosis of SARS-CoV-2 by FDA under an Emergency Use Authorization (EUA). This EUA will remain  in effect (meaning this test can be used) for the duration of the COVID-19 declaration under Section 564(b)(1) of the Act, 21 U.S.C.section 360bbb-3(b)(1), unless the authorization is terminated  or revoked sooner.       Influenza A by PCR NEGATIVE NEGATIVE Final   Influenza B by PCR NEGATIVE NEGATIVE Final    Comment: (NOTE) The Xpert Xpress SARS-CoV-2/FLU/RSV plus assay is intended as an aid in the diagnosis of influenza from Nasopharyngeal swab specimens and should not be used as a sole basis for treatment. Nasal washings and aspirates are unacceptable for Xpert Xpress SARS-CoV-2/FLU/RSV testing.  Fact Sheet for Patients: BloggerCourse.com  Fact Sheet for Healthcare Providers: SeriousBroker.it  This test is not yet approved or cleared by the [place]  FDA and has been authorized for detection and/or diagnosis of SARS-CoV-2 by FDA under an Emergency Use Authorization (EUA). This EUA will remain in effect (meaning this test can be used) for the duration of the COVID-19 declaration under Section 564(b)(1) of the Act, 21 U.S.C. section 360bbb-3(b)(1), unless the authorization is terminated  or revoked.     Resp Syncytial Virus by PCR NEGATIVE NEGATIVE Final    Comment: (NOTE) Fact Sheet for Patients: BloggerCourse.com  Fact Sheet for Healthcare Providers: SeriousBroker.it  This test is not yet approved or cleared by the [place]  FDA and has been authorized for detection and/or diagnosis of SARS-CoV-2 by FDA under an Emergency Use Authorization (EUA). This EUA will remain in effect (meaning this test can be used) for the duration of the COVID-19 declaration under Section 564(b)(1) of the Act, 21 U.S.C. section 360bbb-3(b)(1), unless the authorization is terminated or revoked.  Performed at [facility], [address] [place], [place] [address]    Labs: CBC: Recent Labs  Lab 01/22/24 1731 01/23/24 0555  WBC 7.5 7.5  HGB 14.2 13.9  HCT 43.2 42.7  MCV 84.9 85.2  PLT 304 279   Basic Metabolic Panel: Recent Labs  Lab 01/22/24 1731 01/23/24 0555  NA 141 142  K 3.8 3.6  CL 103 105  CO2 26 26  GLUCOSE 76 107*  BUN 20 22  CREATININE 1.15* 1.13*  CALCIUM  8.5* 8.7*   Liver Function Tests: Recent Labs  Lab 01/22/24 1731 01/23/24 0555  AST 18 19  ALT 13 13  ALKPHOS 60 65  BILITOT 0.9 0.5  PROT 6.6 6.6  ALBUMIN 3.3* 3.6   CBG: Recent Labs  Lab 01/23/24 1151  GLUCAP 114*   Discharge time spent: less than 30 minutes.  Signed: Dr. Sherre [facility] 01/23/2024

## 2024-01-23 NOTE — ED Notes (Signed)
 HR in 30s and 40s but pt is sleeping.

## 2024-01-23 NOTE — H&P (Addendum)
 History and Physical    Patient: Patricia Walker FMW:[id number] DOB: [birth year] DOA: 01/22/2024 DOS: the patient was seen and examined on 01/23/2024 PCP: [name], FNP  Patient coming from: Home  Chief Complaint:  Chief Complaint  Patient presents with   Shortness of Breath        Dizziness    HPI: Patricia Walker is a 78 y.o. female with medical history significant for Asthma/COPD, CAD, HFpEF, PAF on Eliquis , HTN, esophageal stricture s/p dilatation, hypothyroidism, OSA on CPAP at bedtime, morbid obesity, with past hospitalization 10/2023 for H. influenzae bronchospasm with hypoxia, being admitted for workup of presyncope and symptomatic bradycardia (40s to 50s, A-fib).  She presented following an episode of lightheadedness and weakness associated with feeling unbalanced and  slurred speech .  Felt pins-and-needles on the lateral aspect of her left thigh which she has felt before but is now more persistent.  Patient's chronic back pain, no worse than baseline.  Denies bladder or bowel dysfunction. Separately patient mentions that she was started on Lasix  by her cardiologist a couple weeks prior but has not been using it daily because it is causing her to wake up at night to use the bathroom.  Her PCP subsequently ordered Gemtesa  to help with her frequent urination.  In the ED bradycardic to 53 though she dipped into the high 40s on the monitor, occasionally tachypneic to up to 22, low-grade temp of 99.1 which improved on its own to 97.9.  Normotensive Labs with troponin 11 and BNP 144,  Creatinine 1.15 up from 0.096 a couple months prior CBC unremarkable   EKG showed A-fib with slow ventricular response at 46 Chest x-ray was nonacute, showed cardiomegaly and head CT also nonacute showing atrophy and chronic microvascular disease MRI brain pending at the time of admission Respiratory viral panel pending Admission requested     Review of Systems: As mentioned in the history of  present illness. All other systems reviewed and are negative.  Past Medical History:  Diagnosis Date   Anxiety    Arthritis    Asthma    COPD exacerbation (HCC) 10/08/2020   Coronary artery disease    mild, nonobstructive   Depression    Dyspnea    on exertion   Dysrhythmia    Atrial Fibrillation   GERD (gastroesophageal reflux disease)    Heart murmur    Hyperlipidemia    Hypertension    Hypothyroidism    MI, old 2016   nonobstructive CAD by Cath   Morbid obesity (HCC)    Persistent atrial fibrillation (HCC)    Pneumonia 06/2018   and RSV   Psoriasis    Sleep apnea    compliant with CPAP   Thyroid  disease    Past Surgical History:  Procedure Laterality Date   ARTERY BIOPSY Right 07/21/2017   Procedure: BIOPSY TEMPORAL ARTERY;  Surgeon: Jama Cordella MATSU, MD;  Location: [facility];  Service: Vascular;  Laterality: Right;   CARDIAC CATHETERIZATION     CARDIOVERSION Right 09/01/2016   Procedure: Cardioversion;  Surgeon: Fernand Denyse LABOR, MD;  Location: [facility];  Service: Cardiovascular;  Laterality: Right;   CARDIOVERSION N/A 09/09/2016   Procedure: Cardioversion;  Surgeon: Fernand Denyse LABOR, MD;  Location: [facility];  Service: Cardiovascular;  Laterality: N/A;   CATARACT EXTRACTION W/PHACO Right 03/26/2019   Procedure: CATARACT EXTRACTION PHACO AND INTRAOCULAR LENS PLACEMENT (IOC) RIGHT 6.45, 00:39.9;  Surgeon: Jaye Fallow, MD;  Location: [facility];  Service: Ophthalmology;  Laterality: Right;  CATARACT EXTRACTION W/PHACO Left 04/16/2019   Procedure: CATARACT EXTRACTION PHACO AND INTRAOCULAR LENS PLACEMENT (IOC) LEFT;   3.14, 00:24.9;  Surgeon: Jaye Fallow, MD;  Location: [facility];  Service: Ophthalmology;  Laterality: Left;  sleep apnea-CPAP   COLONOSCOPY WITH PROPOFOL  N/A 08/28/2019   Procedure: COLONOSCOPY WITH PROPOFOL ;  Surgeon: Toledo, Ladell POUR, MD;  Location: [facility] END[facility];  Service: Gastroenterology;  Laterality: N/A;   ELECTROPHYSIOLOGIC  STUDY N/A 02/01/2016   Procedure: CARDIOVERSION;  Surgeon: Denyse DELENA Bathe, MD;  Location: [facility];  Service: Cardiovascular;  Laterality: N/A;   ESOPHAGEAL DILATION     ESOPHAGOGASTRODUODENOSCOPY (EGD) WITH PROPOFOL  N/A 02/06/2017   Procedure: ESOPHAGOGASTRODUODENOSCOPY (EGD) WITH PROPOFOL ;  Surgeon: Therisa Bi, MD;  Location: [facility] ENDOSCOPY;  Service: Gastroenterology;  Laterality: N/A;   ESOPHAGOGASTRODUODENOSCOPY (EGD) WITH PROPOFOL  N/A 08/28/2019   Procedure: ESOPHAGOGASTRODUODENOSCOPY (EGD) WITH PROPOFOL ;  Surgeon: Toledo, Ladell POUR, MD;  Location: [facility] END[facility];  Service: Gastroenterology;  Laterality: N/A;   EUS N/A 02/16/2017   Procedure: FULL UPPER ENDOSCOPIC ULTRASOUND (EUS) RADIAL;  Surgeon: Elta Fonda SQUIBB, MD;  Location: [facility] END[facility];  Service: Gastroenterology;  Laterality: N/A;   LEFT HEART CATH AND CORONARY ANGIOGRAPHY N/A 09/08/2016   Procedure: Left Heart Cath and Coronary Angiography;  Surgeon: Bathe Denyse DELENA, MD;  Location: [facility] IN[facility];  Service: Cardiovascular;  Laterality: N/A;   US  ECHOCARDIOGRAPHY     Social History:  reports that she quit smoking about 10 years ago. Her smoking use included cigarettes. She has never used smokeless tobacco. She reports that she does not drink alcohol and does not use drugs.  Allergies  Allergen Reactions   Codeine Hives, Itching and Rash   Other Itching and Other (See Comments)    States antibiotic in the past caused itching but can not remember name    Family History  Problem Relation Age of Onset   Breast cancer Paternal Aunt    Other Father    Heart attack Father     Prior to Admission medications   Medication Sig Start Date End Date Taking? Authorizing Provider  acetaminophen  (TYLENOL ) 325 MG tablet Take 2 tablets (650 mg total) by mouth every 6 (six) hours as needed for mild pain (pain score 1-3) or moderate pain (pain score 4-6). 08/07/23   Jens Durand, MD  albuterol  (VENTOLIN  HFA) 108 [phone] Base) MCG/ACT  inhaler INHALE 1-2 PUFFS EVERY 4-6 HOURS AS NEEDED FOR SHORTNESS OF BREATH 10/02/23   [name], FNP  atorvastatin  (LIPITOR) 40 MG tablet ONCE A DAY FOR CHOLESTEROL 11/23/22   [name], FNP  chlorpheniramine-HYDROcodone  (TUSSIONEX) 10-8 MG/5ML Take 5 mLs by mouth every 12 (twelve) hours as needed for cough. 01/18/24   [name], FNP  dapagliflozin  propanediol (FARXIGA ) 10 MG TABS tablet Take 1 tablet (10 mg total) by mouth daily before breakfast. 01/04/24   Bathe Denyse DELENA, MD  ELIQUIS  5 MG TABS tablet TAKE 1 TABLET BY MOUTH TWICE A DAY 08/03/23   [name], FNP  enalapril  (VASOTEC ) 2.5 MG tablet TAKE 1 TABLET BY MOUTH EVERY DAY 01/16/24   [name], FNP  esomeprazole (NEXIUM) 40 MG capsule TAKE 1 CAPSULE BY MOUTH EVERY DAY IN THE MORNING 11/23/23   [name], FNP  fluticasone  (FLONASE ) 50 MCG/ACT nasal spray Place 1 spray into both nostrils daily. 12/15/23   [name], FNP  furosemide  (LASIX ) 40 MG tablet Take 0.5 tablets (20 mg total) by mouth daily. 01/04/24   Bathe Denyse DELENA, MD  gabapentin  (NEURONTIN ) 300 MG capsule Take 300 mg by mouth daily.    [provider]  ipratropium-albuterol  (DUONEB) 0.5-2.5 (3) MG/3ML SOLN Take 3 mLs by nebulization every 6 (six) hours as needed. 08/14/23   [name], FNP  meloxicam (MOBIC) 15 MG tablet Take 15 mg by mouth daily. 08/29/23   [provider]  metolazone  (ZAROXOLYN ) 2.5 MG tablet Take 1 tablet (2.5 mg total) by mouth daily for 3 days. 12/22/23 01/04/24  Scoggins, Amber, NP  metoprolol  succinate (TOPROL -XL) 25 MG 24 hr tablet TAKE 1 TABLET (25 MG TOTAL) BY MOUTH DAILY. 04/27/23   [name], FNP  nystatin  powder Apply 1 Application topically 3 (three) times daily as needed. Irritation under breast and groin area 07/12/23   [name], FNP  TALTZ 80 MG/ML SOAJ Inject 80 mg into the skin every 28 (twenty-eight) days.    [provider]  tirzepatide  (MOUNJARO ) 2.5  MG/0.5ML Pen Inject 2.5 mg into the skin once a week.    [provider]  Vibegron  (GEMTESA ) 75 MG TABS Take 1 tablet (75 mg total) by mouth daily. 10/11/23   [name], FNP    Physical Exam: Vitals:   01/22/24 1726 01/22/24 1729 01/22/24 2224  BP: 117/85  (!) 141/84  Pulse: (!) 53  68  Resp:  (!) 22 18  Temp: 99.1 F (37.3 C)  97.9 F (36.6 C)  TempSrc:   Oral  SpO2: 100%  100%  Weight: 136.1 kg    Height: 5' 4 (1.626 m)     Physical Exam Vitals and nursing note reviewed.  Constitutional:      General: She is not in acute distress. HENT:     Head: Normocephalic and atraumatic.  Cardiovascular:     Rate and Rhythm: Bradycardia present. Rhythm irregular.     Heart sounds: Normal heart sounds.  Pulmonary:     Effort: Pulmonary effort is normal.     Breath sounds: Normal breath sounds.  Abdominal:     Palpations: Abdomen is soft.     Tenderness: There is no abdominal tenderness.  Neurological:     Mental Status: Mental status is at baseline.     Labs on Admission: I have personally reviewed following labs and imaging studies  CBC: Recent Labs  Lab 01/22/24 1731  WBC 7.5  HGB 14.2  HCT 43.2  MCV 84.9  PLT 304   Basic Metabolic Panel: Recent Labs  Lab 01/22/24 1731  NA 141  K 3.8  CL 103  CO2 26  GLUCOSE 76  BUN 20  CREATININE 1.15*  CALCIUM  8.5*   GFR: Estimated Creatinine Clearance: 56.5 mL/min (A) (by C-G formula based on SCr of 1.15 mg/dL (H)). Liver Function Tests: Recent Labs  Lab 01/22/24 1731  AST 18  ALT 13  ALKPHOS 60  BILITOT 0.9  PROT 6.6  ALBUMIN 3.3*   No results for input(s): LIPASE, AMYLASE in the last 168 hours. No results for input(s): AMMONIA in the last 168 hours. Coagulation Profile: No results for input(s): INR, PROTIME in the last 168 hours. Cardiac Enzymes: No results for input(s): CKTOTAL, CKMB, CKMBINDEX, TROPONINI in the last 168 hours. BNP (last 3 results) No results for  input(s): PROBNP in the last 8760 hours. HbA1C: No results for input(s): HGBA1C in the last 72 hours. CBG: No results for input(s): GLUCAP in the last 168 hours. Lipid Profile: No results for input(s): CHOL, HDL, LDLCALC, TRIG, CHOLHDL, LDLDIRECT in the last 72 hours. Thyroid   Function Tests: No results for input(s): TSH, T4TOTAL, FREET4, T3FREE, THYROIDAB in the last 72 hours. Anemia Panel: No results for input(s): VITAMINB12, FOLATE, FERRITIN, TIBC, IRON, RETICCTPCT in the last 72 hours. Urine analysis:    Component Value Date/Time   COLORURINE YELLOW (A) 01/22/2024 1730   APPEARANCEUR CLEAR (A) 01/22/2024 1730   APPEARANCEUR Clear 07/14/2012 0822   LABSPEC 1.016 01/22/2024 1730   LABSPEC 1.021 07/14/2012 0822   PHURINE 5.0 01/22/2024 1730   GLUCOSEU NEGATIVE 01/22/2024 1730   GLUCOSEU Negative 07/14/2012 0822   HGBUR NEGATIVE 01/22/2024 1730   BILIRUBINUR NEGATIVE 01/22/2024 1730   BILIRUBINUR Negative 01/31/2023 1339   BILIRUBINUR Negative 07/14/2012 0822   KETONESUR NEGATIVE 01/22/2024 1730   PROTEINUR NEGATIVE 01/22/2024 1730   UROBILINOGEN 0.2 01/31/2023 1339   NITRITE NEGATIVE 01/22/2024 1730   LEUKOCYTESUR NEGATIVE 01/22/2024 1730   LEUKOCYTESUR Trace 07/14/2012 0822    Radiological Exams on Admission: CT Head Wo Contrast Result Date: 01/22/2024 CLINICAL DATA:  Neuro deficit, acute, stroke suspected EXAM: CT HEAD WITHOUT CONTRAST TECHNIQUE: Contiguous axial images were obtained from the base of the skull through the vertex without intravenous contrast. RADIATION DOSE REDUCTION: This exam was performed according to the departmental dose-optimization program which includes automated exposure control, adjustment of the mA and/or kV according to patient size and/or use of iterative reconstruction technique. COMPARISON:  02/02/2023 FINDINGS: Brain: There is atrophy and chronic small vessel disease changes. No acute intracranial  abnormality. Specifically, no hemorrhage, hydrocephalus, mass lesion, acute infarction, or significant intracranial injury. Vascular: No hyperdense vessel or unexpected calcification. Skull: No acute calvarial abnormality. Sinuses/Orbits: No acute findings Other: None IMPRESSION: Atrophy, chronic microvascular disease. No acute intracranial abnormality. Electronically Signed   By: Franky Crease M.D.   On: 01/22/2024 22:40   DG Chest 2 View Result Date: 01/22/2024 CLINICAL DATA:  Shortness of breath EXAM: CHEST - 2 VIEW COMPARISON:  11/05/2023 FINDINGS: Cardiomegaly. No overt edema, confluent opacities or effusions. No acute bony abnormality. IMPRESSION: Cardiomegaly.  No active disease. Electronically Signed   By: Franky Crease M.D.   On: 01/22/2024 22:02   Data Reviewed for HPI: Relevant notes from primary care and specialist visits, past discharge summaries as available in EHR, including Care Everywhere. Prior diagnostic testing as pertinent to current admission diagnoses Updated medications and problem lists for reconciliation ED course, including vitals, labs, imaging, treatment and response to treatment Triage notes, nursing and pharmacy notes and ED provider's notes Notable results as noted above in HPI      Assessment and Plan: * Atrial fibrillation with slow ventricular response (HCC) Symptomatic bradycardia-postural dizziness with presyncope Patient not currently on rate control agents CT head negative Follow-up MRI to evaluate for CVA Continuous cardiac monitoring, echo if not done within the last 6 months Neurologic checks and fall precautions Continue apixaban  Consulting patient's primary cardiologist, Dr. Fernand  AKI (acute kidney injury), possible Patient reports frequent urination since being started on furosemide  a couple weeks prior Creatinine 1.15, baseline 0.96 Possibility of mild dehydration related to diuretics Holding diuretics  Focal neurological deficit Patient  reports slurred speech and imbalance during the episode of lightheadedness She has persistent pins-and-needles feeling on the lateral left thigh CT head nonacute Follow-up MRI to evaluate for stroke Continue statins and antiplatelets  Chronic heart failure with preserved ejection fraction (HFpEF) (HCC) CAD Both conditions stable Holding metoprolol  due to symptomatic bradycardia Continue GDMT with enalapril , Farxiga ,  atorvastatin .  Continue apixaban   Chronic obstructive pulmonary disease (HCC) Not acutely exacerbated Continue DuoNebs,  Tussionex, Flonase   Hypothyroidism (acquired) Continue meds pending verification We will check TSH in the setting of symptomatic bradycardia  Hypertension Continue home meds except for rate control agents  Obstructive sleep apnea syndrome Morbid obesity with BMI 51.49 Complicating factors overall prognosis and care CPAP nightly     DVT prophylaxis: eliquis   Consults: Cardiologist Dr. Samul Lovelace covering for  Advance Care Planning:   Code Status: Prior   Family Communication: none  Disposition Plan: Back to previous home environment  Severity of Illness: The appropriate patient status for this patient is OBSERVATION. Observation status is judged to be reasonable and necessary in order to provide the required intensity of service to ensure the patient's safety. The patient's presenting symptoms, physical exam findings, and initial radiographic and laboratory data in the context of their medical condition is felt to place them at decreased risk for further clinical deterioration. Furthermore, it is anticipated that the patient will be medically stable for discharge from the hospital within 2 midnights of admission.   Author: Delayne LULLA Solian, MD 01/23/2024 12:07 AM  For on call review [email or web address].

## 2024-01-29 ENCOUNTER — Ambulatory Visit (INDEPENDENT_AMBULATORY_CARE_PROVIDER_SITE_OTHER): Admitting: Cardiovascular Disease

## 2024-01-29 ENCOUNTER — Ambulatory Visit: Admitting: Cardiovascular Disease

## 2024-01-29 ENCOUNTER — Encounter: Payer: Self-pay | Admitting: Cardiovascular Disease

## 2024-01-29 VITALS — BP 110/58 | HR 57 | Ht 64.0 in | Wt 302.0 lb

## 2024-01-29 DIAGNOSIS — R0602 Shortness of breath: Secondary | ICD-10-CM

## 2024-01-29 DIAGNOSIS — Z131 Encounter for screening for diabetes mellitus: Secondary | ICD-10-CM

## 2024-01-29 DIAGNOSIS — R42 Dizziness and giddiness: Secondary | ICD-10-CM | POA: Diagnosis not present

## 2024-01-29 DIAGNOSIS — Z0130 Encounter for examination of blood pressure without abnormal findings: Secondary | ICD-10-CM

## 2024-01-29 DIAGNOSIS — E782 Mixed hyperlipidemia: Secondary | ICD-10-CM | POA: Diagnosis not present

## 2024-01-29 DIAGNOSIS — I4891 Unspecified atrial fibrillation: Secondary | ICD-10-CM | POA: Diagnosis not present

## 2024-01-29 NOTE — Progress Notes (Signed)
 Cardiology Office Note   Date:  01/29/2024   ID:  Patricia Walker, DOB 27-Jan-1946, MRN [id number]  PCP:  [name], FNP  Cardiologist:  Denyse Bathe, MD      History of Present Illness: Patricia Walker is a 78 y.o. female who presents for  Chief Complaint  Patient presents with   Follow-up    Follow up from ER, dizziness    Feels like doped up, dizzy,and went to ER by EMS last week. Since discharged 01/23/24, has not been taking any medications.      Past Medical History:  Diagnosis Date   Anxiety    Arthritis    Asthma    COPD exacerbation (HCC) 10/08/2020   Coronary artery disease    mild, nonobstructive   Depression    Dyspnea    on exertion   Dysrhythmia    Atrial Fibrillation   GERD (gastroesophageal reflux disease)    Heart murmur    Hyperlipidemia    Hypertension    Hypothyroidism    MI, old 2016   nonobstructive CAD by Cath   Morbid obesity (HCC)    Persistent atrial fibrillation (HCC)    Pneumonia 06/2018   and RSV   Psoriasis    Sleep apnea    compliant with CPAP   Thyroid  disease      Past Surgical History:  Procedure Laterality Date   ARTERY BIOPSY Right 07/21/2017   Procedure: BIOPSY TEMPORAL ARTERY;  Surgeon: Jama Cordella MATSU, MD;  Location: [facility] OR[facility];  Service: Vascular;  Laterality: Right;   CARDIAC CATHETERIZATION     CARDIOVERSION Right 09/01/2016   Procedure: Cardioversion;  Surgeon: Bathe Denyse LABOR, MD;  Location: [facility];  Service: Cardiovascular;  Laterality: Right;   CARDIOVERSION N/A 09/09/2016   Procedure: Cardioversion;  Surgeon: Bathe Denyse LABOR, MD;  Location: [facility];  Service: Cardiovascular;  Laterality: N/A;   CATARACT EXTRACTION W/PHACO Right 03/26/2019   Procedure: CATARACT EXTRACTION PHACO AND INTRAOCULAR LENS PLACEMENT (IOC) RIGHT 6.45, 00:39.9;  Surgeon: Jaye Fallow, MD;  Location: [facility];  Service: Ophthalmology;  Laterality: Right;   CATARACT EXTRACTION W/PHACO Left 04/16/2019    Procedure: CATARACT EXTRACTION PHACO AND INTRAOCULAR LENS PLACEMENT (IOC) LEFT;   3.14, 00:24.9;  Surgeon: Jaye Fallow, MD;  Location: [facility];  Service: Ophthalmology;  Laterality: Left;  sleep apnea-CPAP   COLONOSCOPY WITH PROPOFOL  N/A 08/28/2019   Procedure: COLONOSCOPY WITH PROPOFOL ;  Surgeon: Toledo, Ladell POUR, MD;  Location: [facility] ENDOSCOPY;  Service: Gastroenterology;  Laterality: N/A;   ELECTROPHYSIOLOGIC STUDY N/A 02/01/2016   Procedure: CARDIOVERSION;  Surgeon: Denyse LABOR Bathe, MD;  Location: [facility];  Service: Cardiovascular;  Laterality: N/A;   ESOPHAGEAL DILATION     ESOPHAGOGASTRODUODENOSCOPY (EGD) WITH PROPOFOL  N/A 02/06/2017   Procedure: ESOPHAGOGASTRODUODENOSCOPY (EGD) WITH PROPOFOL ;  Surgeon: Therisa Bi, MD;  Location: [facility] ENDOSCOPY;  Service: Gastroenterology;  Laterality: N/A;   ESOPHAGOGASTRODUODENOSCOPY (EGD) WITH PROPOFOL  N/A 08/28/2019   Procedure: ESOPHAGOGASTRODUODENOSCOPY (EGD) WITH PROPOFOL ;  Surgeon: Toledo, Ladell POUR, MD;  Location: [facility] ENDOSCOPY;  Service: Gastroenterology;  Laterality: N/A;   EUS N/A 02/16/2017   Procedure: FULL UPPER ENDOSCOPIC ULTRASOUND (EUS) RADIAL;  Surgeon: Elta Fonda SQUIBB, MD;  Location: [facility] END[facility];  Service: Gastroenterology;  Laterality: N/A;   LEFT HEART CATH AND CORONARY ANGIOGRAPHY N/A 09/08/2016   Procedure: Left Heart Cath and Coronary Angiography;  Surgeon: Bathe Denyse LABOR, MD;  Location: [facility] IN[facility];  Service: Cardiovascular;  Laterality: N/A;   US  ECHOCARDIOGRAPHY  Current Outpatient Medications  Medication Sig Dispense Refill   acetaminophen  (TYLENOL ) 325 MG tablet Take 2 tablets (650 mg total) by mouth every 6 (six) hours as needed for mild pain (pain score 1-3) or moderate pain (pain score 4-6).     albuterol  (VENTOLIN  HFA) 108 (90 Base) MCG/ACT inhaler INHALE 1-2 PUFFS EVERY 4-6 HOURS AS NEEDED FOR SHORTNESS OF BREATH 18 each 6   atorvastatin  (LIPITOR) 40 MG tablet ONCE A DAY FOR CHOLESTEROL  90 tablet 3   dapagliflozin  propanediol (FARXIGA ) 10 MG TABS tablet Take 1 tablet (10 mg total) by mouth daily before breakfast. 30 tablet 3   ELIQUIS  5 MG TABS tablet TAKE 1 TABLET BY MOUTH TWICE A DAY 180 tablet 1   esomeprazole (NEXIUM) 40 MG capsule TAKE 1 CAPSULE BY MOUTH EVERY DAY IN THE MORNING 90 capsule 3   fluticasone  (FLONASE ) 50 MCG/ACT nasal spray Place 1 spray into both nostrils daily. (Patient taking differently: Place 1 spray into both nostrils daily as needed.)     ipratropium-albuterol  (DUONEB) 0.5-2.5 (3) MG/3ML SOLN Take 3 mLs by nebulization every 6 (six) hours as needed. 120 mL 2   meclizine (ANTIVERT) 12.5 MG tablet Take 1 tablet (12.5 mg total) by mouth 2 (two) times daily as needed for dizziness. 10 tablet 0   nystatin  powder Apply 1 Application topically 3 (three) times daily as needed. Irritation under breast and groin area 60 g 1   TALTZ 80 MG/ML SOAJ Inject 80 mg into the skin every 28 (twenty-eight) days.     Vibegron  (GEMTESA ) 75 MG TABS Take 1 tablet (75 mg total) by mouth daily. 30 tablet 5   No current facility-administered medications for this visit.    Allergies:   Codeine and Other    Social History:   reports that she quit smoking about 10 years ago. Her smoking use included cigarettes. She has never used smokeless tobacco. She reports that she does not drink alcohol and does not use drugs.   Family History:  family history includes Breast cancer in her paternal aunt; Heart attack in her father; Other in her father.    ROS:     Review of Systems  Constitutional: Negative.   HENT: Negative.    Eyes: Negative.   Respiratory: Negative.    Gastrointestinal: Negative.   Genitourinary: Negative.   Musculoskeletal: Negative.   Skin: Negative.   Neurological: Negative.   Endo/Heme/Allergies: Negative.   Psychiatric/Behavioral: Negative.    All other systems reviewed and are negative.     All other systems are reviewed and negative.    PHYSICAL  EXAM: VS:  BP (!) 110/58   Pulse (!) 57   Ht 5' 4 (1.626 m)   Wt (!) 302 lb (137 kg)   SpO2 97%   BMI 51.84 kg/m  , BMI Body mass index is 51.84 kg/m. Last weight:  Wt Readings from Last 3 Encounters:  01/29/24 (!) 302 lb (137 kg)  01/22/24 300 lb (136.1 kg)  01/16/24 300 lb (136.1 kg)     Physical Exam Constitutional:      Appearance: Normal appearance.  Cardiovascular:     Rate and Rhythm: Normal rate and regular rhythm.     Heart sounds: Normal heart sounds.  Pulmonary:     Effort: Pulmonary effort is normal.     Breath sounds: Normal breath sounds.  Musculoskeletal:     Right lower leg: No edema.     Left lower leg: No edema.  Neurological:  Mental Status: She is alert.       EKG:   Recent Labs: 12/22/2023: Magnesium  1.9 01/22/2024: B Natriuretic Peptide 144.7; TSH 3.144 01/23/2024: ALT 13; BUN 22; Creatinine, Ser 1.13; Hemoglobin 13.9; Platelets 279; Potassium 3.6; Sodium 142    Lipid Panel    Component Value Date/Time   CHOL 209 (H) 12/22/2023 1359   CHOL 190 05/18/2012 0928   TRIG 118 12/22/2023 1359   TRIG 138 05/18/2012 0928   HDL 39 (L) 12/22/2023 1359   HDL 35 (L) 05/18/2012 0928   CHOLHDL 5.4 (H) 12/22/2023 1359   VLDL 28 05/18/2012 0928   LDLCALC 149 (H) 12/22/2023 1359   LDLCALC 127 (H) 05/18/2012 9071      Other studies Reviewed: Additional studies/ records that were reviewed today include:  Review of the above records demonstrates:       No data to display            ASSESSMENT AND PLAN:    ICD-10-CM   1. SOB (shortness of breath)  R06.02     2. Mixed hyperlipidemia  E78.2    continue lipitor.    3. Dizziness  R42    Hold enalpril, lasix  40, imdur 30, and metoprolol . Continue farxiga , mecalzine.    4. Atrial fibrillation with slow ventricular response (HCC)  I48.91    Off metoprolol  VR 57 from 27.       Problem List Items Addressed This Visit       Cardiovascular and Mediastinum   Atrial fibrillation with  slow ventricular response (HCC)     Other   Hyperlipidemia, unspecified   Other Visit Diagnoses       SOB (shortness of breath)    -  Primary     Dizziness       Hold enalpril, lasix  40, imdur 30, and metoprolol . Continue farxiga , mecalzine.          Disposition:   Return in about 1 week (around 02/05/2024).    Total time spent: 40 minutes  Signed,  Denyse Bathe, MD  01/29/2024 2:45 PM    [facility]

## 2024-01-31 ENCOUNTER — Other Ambulatory Visit: Payer: Self-pay | Admitting: Family

## 2024-01-31 DIAGNOSIS — I4891 Unspecified atrial fibrillation: Secondary | ICD-10-CM

## 2024-02-05 ENCOUNTER — Ambulatory Visit (INDEPENDENT_AMBULATORY_CARE_PROVIDER_SITE_OTHER): Admitting: Cardiovascular Disease

## 2024-02-05 ENCOUNTER — Encounter: Payer: Self-pay | Admitting: Cardiovascular Disease

## 2024-02-05 VITALS — BP 130/80 | HR 70 | Ht 65.0 in | Wt 303.4 lb

## 2024-02-05 DIAGNOSIS — M7989 Other specified soft tissue disorders: Secondary | ICD-10-CM | POA: Diagnosis not present

## 2024-02-05 DIAGNOSIS — R0602 Shortness of breath: Secondary | ICD-10-CM | POA: Diagnosis not present

## 2024-02-05 DIAGNOSIS — E782 Mixed hyperlipidemia: Secondary | ICD-10-CM | POA: Diagnosis not present

## 2024-02-05 DIAGNOSIS — I4891 Unspecified atrial fibrillation: Secondary | ICD-10-CM

## 2024-02-05 DIAGNOSIS — I5033 Acute on chronic diastolic (congestive) heart failure: Secondary | ICD-10-CM

## 2024-02-05 DIAGNOSIS — R0789 Other chest pain: Secondary | ICD-10-CM | POA: Diagnosis not present

## 2024-02-05 DIAGNOSIS — Z0130 Encounter for examination of blood pressure without abnormal findings: Secondary | ICD-10-CM

## 2024-02-05 MED ORDER — FUROSEMIDE 20 MG PO TABS: 20.0000 mg | ORAL_TABLET | Freq: Every day | ORAL | 11 refills | Status: AC

## 2024-02-05 MED ORDER — ENALAPRIL MALEATE 2.5 MG PO TABS: 2.5000 mg | ORAL_TABLET | Freq: Every day | ORAL | 11 refills | Status: AC

## 2024-02-05 NOTE — Progress Notes (Signed)
 [facility] Refill Coordination Note    Specialty Medication(s) to be Shipped:   Inflammatory Disorders: Taltz     Other medication(s) to be shipped: No additional medications requested for fill at this time    Specialty Medications not needed at this time: N/A     Lisa Carlson, DOB: 21-Sep-1945  Phone: [phone] (home)       All above HIPAA information was verified with patient.     Was a nurse, learning disability used for this call? No    Completed refill call assessment today to schedule patient's medication shipment from the [facility]  [phone]).  All relevant notes have been reviewed.     Specialty medication(s) and dose(s) confirmed: Regimen is correct and unchanged.   Changes to medications: [place] reports no changes at this time.  Changes to insurance: No  New side effects reported not previously addressed with a pharmacist or physician: None reported  Questions for the pharmacist: No    Confirmed patient received a Conservation Officer, Historic Buildings and a Surveyor, Mining with first shipment. The patient will receive a drug information handout for each medication shipped and additional FDA Medication Guides as required.       DISEASE/MEDICATION-SPECIFIC INFORMATION        For patients on injectable medications: Next injection is scheduled for 02/12/24.    SPECIALTY MEDICATION ADHERENCE     Medication Adherence    Patient reported X missed doses in the last month: 0  Specialty Medication: TALTZ  AUTOINJECTOR 80 mg/mL Atin auto-injector (ixekizumab )  Patient is on additional specialty medications: No              Were doses missed due to medication being on hold? No     TALTZ  AUTOINJECTOR 80 mg/mL Atin auto-injector (ixekizumab ): 0 doses of medicine on hand       REFERRAL TO PHARMACIST     Referral to the pharmacist: Not needed      [facility]     Shipping address confirmed in Epic.     Cost and Payment: Patient has a $0 copay, payment information is not required.    Delivery Scheduled: Yes, Expected medication delivery date: 02/08/24.     Medication will be delivered via Same Day Courier to the prescription address in Epic WAM.    Lisa Carlson   [facility]  Specialty Technician

## 2024-02-05 NOTE — Addendum Note (Signed)
 Addended by: FERNAND ALTER A on: 02/05/2024 02:05 PM   Modules accepted: Level of Service

## 2024-02-05 NOTE — Patient Instructions (Signed)
 Restart lasix (Furosamide), and enalapril  2.5 mg daily.

## 2024-02-05 NOTE — Progress Notes (Addendum)
 Cardiology Office Note   Date:  02/05/2024   ID:  Keonna Raether Vaness, DOB 07-Mar-1946, MRN [id number]  PCP:  [name], FNP  Cardiologist:  Denyse Bathe, MD      History of Present Illness: Patricia Walker is a 78 y.o. female who presents for  Chief Complaint  Patient presents with   Follow-up    1 week follow up    Tired ,weak, SOB and chest  and  chest pains.      Past Medical History:  Diagnosis Date   Anxiety    Arthritis    Asthma    COPD exacerbation (HCC) 10/08/2020   Coronary artery disease    mild, nonobstructive   Depression    Dyspnea    on exertion   Dysrhythmia    Atrial Fibrillation   GERD (gastroesophageal reflux disease)    Heart murmur    Hyperlipidemia    Hypertension    Hypothyroidism    MI, old 2016   nonobstructive CAD by Cath   Morbid obesity (HCC)    Persistent atrial fibrillation (HCC)    Pneumonia 06/2018   and RSV   Psoriasis    Sleep apnea    compliant with CPAP   Thyroid  disease      Past Surgical History:  Procedure Laterality Date   ARTERY BIOPSY Right 07/21/2017   Procedure: BIOPSY TEMPORAL ARTERY;  Surgeon: Jama Cordella MATSU, MD;  Location: [facility];  Service: Vascular;  Laterality: Right;   CARDIAC CATHETERIZATION     CARDIOVERSION Right 09/01/2016   Procedure: Cardioversion;  Surgeon: Bathe Denyse LABOR, MD;  Location: [facility];  Service: Cardiovascular;  Laterality: Right;   CARDIOVERSION N/A 09/09/2016   Procedure: Cardioversion;  Surgeon: Bathe Denyse LABOR, MD;  Location: [facility];  Service: Cardiovascular;  Laterality: N/A;   CATARACT EXTRACTION W/PHACO Right 03/26/2019   Procedure: CATARACT EXTRACTION PHACO AND INTRAOCULAR LENS PLACEMENT (IOC) RIGHT 6.45, 00:39.9;  Surgeon: Jaye Fallow, MD;  Location[facility];  Service: Ophthalmology;  Laterality: Right;   CATARACT EXTRACTION W/PHACO Left 04/16/2019   Procedure: CATARACT EXTRACTION PHACO AND INTRAOCULAR LENS PLACEMENT (IOC) LEFT;   3.14, 00:24.9;   Surgeon: Jaye Fallow, MD;  Location: [facility];  Service: Ophthalmology;  Laterality: Left;  sleep apnea-CPAP   COLONOSCOPY WITH PROPOFOL  N/A 08/28/2019   Procedure: COLONOSCOPY WITH PROPOFOL ;  Surgeon: Toledo, Ladell POUR, MD;  Location: [facility];  Service: Gastroenterology;  Laterality: N/A;   ELECTROPHYSIOLOGIC STUDY N/A 02/01/2016   Procedure: CARDIOVERSION;  Surgeon: Denyse LABOR Bathe, MD;  Location: [facility];  Service: Cardiovascular;  Laterality: N/A;   ESOPHAGEAL DILATION     ESOPHAGOGASTRODUODENOSCOPY (EGD) WITH PROPOFOL  N/A 02/06/2017   Procedure: ESOPHAGOGASTRODUODENOSCOPY (EGD) WITH PROPOFOL ;  Surgeon: Therisa Bi, MD;  Location: [facility];  Service: Gastroenterology;  Laterality: N/A;   ESOPHAGOGASTRODUODENOSCOPY (EGD) WITH PROPOFOL  N/A 08/28/2019   Procedure: ESOPHAGOGASTRODUODENOSCOPY (EGD) WITH PROPOFOL ;  Surgeon: Toledo, Ladell POUR, MD;  Location: [facility];  Service: Gastroenterology;  Laterality: N/A;   EUS N/A 02/16/2017   Procedure: FULL UPPER ENDOSCOPIC ULTRASOUND (EUS) RADIAL;  Surgeon: Elta Fonda SQUIBB, MD;  Location: [facility];  Service: Gastroenterology;  Laterality: N/A;   LEFT HEART CATH AND CORONARY ANGIOGRAPHY N/A 09/08/2016   Procedure: Left Heart Cath and Coronary Angiography;  Surgeon: Bathe Denyse LABOR, MD;  Location: [facility];  Service: Cardiovascular;  Laterality: N/A;   US  ECHOCARDIOGRAPHY       Current Outpatient Medications  Medication Sig Dispense Refill  enalapril  (VASOTEC ) 2.5 MG tablet Take 1 tablet (2.5 mg total) by mouth daily. 30 tablet 11   furosemide  (LASIX ) 20 MG tablet Take 1 tablet (20 mg total) by mouth daily. 30 tablet 11   acetaminophen  (TYLENOL ) 325 MG tablet Take 2 tablets (650 mg total) by mouth every 6 (six) hours as needed for mild pain (pain score 1-3) or moderate pain (pain score 4-6).     albuterol  (VENTOLIN  HFA) 108 (90 Base) MCG/ACT inhaler INHALE 1-2 PUFFS EVERY 4-6 HOURS AS NEEDED FOR  SHORTNESS OF BREATH 18 each 6   atorvastatin  (LIPITOR) 40 MG tablet ONCE A DAY FOR CHOLESTEROL 90 tablet 3   dapagliflozin  propanediol (FARXIGA ) 10 MG TABS tablet Take 1 tablet (10 mg total) by mouth daily before breakfast. 30 tablet 3   ELIQUIS  5 MG TABS tablet TAKE 1 TABLET BY MOUTH TWICE A DAY 180 tablet 1   esomeprazole (NEXIUM) 40 MG capsule TAKE 1 CAPSULE BY MOUTH EVERY DAY IN THE MORNING 90 capsule 3   fluticasone  (FLONASE ) 50 MCG/ACT nasal spray Place 1 spray into both nostrils daily. (Patient taking differently: Place 1 spray into both nostrils daily as needed.)     ipratropium-albuterol  (DUONEB) 0.5-2.5 (3) MG/3ML SOLN Take 3 mLs by nebulization every 6 (six) hours as needed. 120 mL 2   meclizine (ANTIVERT) 12.5 MG tablet Take 1 tablet (12.5 mg total) by mouth 2 (two) times daily as needed for dizziness. 10 tablet 0   nystatin  powder Apply 1 Application topically 3 (three) times daily as needed. Irritation under breast and groin area 60 g 1   TALTZ 80 MG/ML SOAJ Inject 80 mg into the skin every 28 (twenty-eight) days.     Vibegron  (GEMTESA ) 75 MG TABS Take 1 tablet (75 mg total) by mouth daily. 30 tablet 5   No current facility-administered medications for this visit.    Allergies:   Codeine and Other    Social History:   reports that she quit smoking about 11 years ago. Her smoking use included cigarettes. She has never used smokeless tobacco. She reports that she does not drink alcohol and does not use drugs.   Family History:  family history includes Breast cancer in her paternal aunt; Heart attack in her father; Other in her father.    ROS:     Review of Systems  Constitutional: Negative.   HENT: Negative.    Eyes: Negative.   Respiratory: Negative.    Gastrointestinal: Negative.   Genitourinary: Negative.   Musculoskeletal: Negative.   Skin: Negative.   Neurological: Negative.   Endo/Heme/Allergies: Negative.   Psychiatric/Behavioral: Negative.    All other systems  reviewed and are negative.     All other systems are reviewed and negative.    PHYSICAL EXAM: VS:  BP 130/80 Comment: couldnt get BP  Pulse 70   Ht 5' 5 (1.651 m)   Wt (!) 303 lb 6.4 oz (137.6 kg)   SpO2 97%   BMI 50.49 kg/m  , BMI Body mass index is 50.49 kg/m. Last weight:  Wt Readings from Last 3 Encounters:  02/05/24 (!) 303 lb 6.4 oz (137.6 kg)  01/29/24 (!) 302 lb (137 kg)  01/22/24 300 lb (136.1 kg)     Physical Exam Constitutional:      Appearance: Normal appearance.  Cardiovascular:     Rate and Rhythm: Normal rate and regular rhythm.     Heart sounds: Normal heart sounds.  Pulmonary:     Effort: Pulmonary effort is normal.  Breath sounds: Normal breath sounds.  Musculoskeletal:     Right lower leg: No edema.     Left lower leg: No edema.  Neurological:     Mental Status: She is alert.       EKG:   Recent Labs: 12/22/2023: Magnesium  1.9 01/22/2024: B Natriuretic Peptide 144.7; TSH 3.144 01/23/2024: ALT 13; BUN 22; Creatinine, Ser 1.13; Hemoglobin 13.9; Platelets 279; Potassium 3.6; Sodium 142    Lipid Panel    Component Value Date/Time   CHOL 209 (H) 12/22/2023 1359   CHOL 190 05/18/2012 0928   TRIG 118 12/22/2023 1359   TRIG 138 05/18/2012 0928   HDL 39 (L) 12/22/2023 1359   HDL 35 (L) 05/18/2012 0928   CHOLHDL 5.4 (H) 12/22/2023 1359   VLDL 28 05/18/2012 0928   LDLCALC 149 (H) 12/22/2023 1359   LDLCALC 127 (H) 05/18/2012 9071      Other studies Reviewed: Additional studies/ records that were reviewed today include:  Review of the above records demonstrates:       No data to display            ASSESSMENT AND PLAN:    ICD-10-CM   1. Mixed hyperlipidemia  E78.2 furosemide  (LASIX ) 20 MG tablet    enalapril  (VASOTEC ) 2.5 MG tablet    Comprehensive metabolic panel    2. SOB (shortness of breath)  R06.02 furosemide  (LASIX ) 20 MG tablet    enalapril  (VASOTEC ) 2.5 MG tablet    Comprehensive metabolic panel    3. Atrial  fibrillation, unspecified type (HCC)  I48.91 furosemide  (LASIX ) 20 MG tablet    enalapril  (VASOTEC ) 2.5 MG tablet    Comprehensive metabolic panel   Still in afib with SVR 56/min. Off metoprolol  and still too slow. Advise PPM with Dr. parachoe.    4. Leg swelling  M79.89 furosemide  (LASIX ) 20 MG tablet    enalapril  (VASOTEC ) 2.5 MG tablet    Comprehensive metabolic panel    5. Other chest pain  R07.89 furosemide  (LASIX ) 20 MG tablet    enalapril  (VASOTEC ) 2.5 MG tablet    Comprehensive metabolic panel    6. Acute on chronic heart failure with preserved ejection fraction (HFpEF) (HCC)  I50.33 furosemide  (LASIX ) 20 MG tablet    enalapril  (VASOTEC ) 2.5 MG tablet    Comprehensive metabolic panel   Restart lasix /enalpril and do labs. ECHO had normal EF.       Problem List Items Addressed This Visit       Cardiovascular and Mediastinum   Cardiac arrhythmia   Relevant Medications   furosemide  (LASIX ) 20 MG tablet   enalapril  (VASOTEC ) 2.5 MG tablet   Other Relevant Orders   Comprehensive metabolic panel     Other   Hyperlipidemia, unspecified - Primary   Relevant Medications   furosemide  (LASIX ) 20 MG tablet   enalapril  (VASOTEC ) 2.5 MG tablet   Other Relevant Orders   Comprehensive metabolic panel   Leg swelling   Relevant Medications   furosemide  (LASIX ) 20 MG tablet   enalapril  (VASOTEC ) 2.5 MG tablet   Other Relevant Orders   Comprehensive metabolic panel   Other Visit Diagnoses       SOB (shortness of breath)       Relevant Medications   furosemide  (LASIX ) 20 MG tablet   enalapril  (VASOTEC ) 2.5 MG tablet   Other Relevant Orders   Comprehensive metabolic panel     Other chest pain       Relevant Medications   furosemide  (LASIX ) 20 MG  tablet   enalapril  (VASOTEC ) 2.5 MG tablet   Other Relevant Orders   Comprehensive metabolic panel     Acute on chronic heart failure with preserved ejection fraction (HFpEF) (HCC)       Restart lasix /enalpril and do labs. ECHO  had normal EF.   Relevant Medications   furosemide  (LASIX ) 20 MG tablet   enalapril  (VASOTEC ) 2.5 MG tablet   Other Relevant Orders   Comprehensive metabolic panel          Disposition:   Return in about 4 weeks (around 03/04/2024) for set up to see DR. Parachow for Pacemaker implantation as has atrial fib with SVR.    Total time spent: 500 minutes  Signed,  Denyse Bathe, MD  02/05/2024 2:05 PM    [facility]

## 2024-02-08 ENCOUNTER — Other Ambulatory Visit

## 2024-02-08 DIAGNOSIS — I4891 Unspecified atrial fibrillation: Secondary | ICD-10-CM | POA: Diagnosis not present

## 2024-02-08 DIAGNOSIS — M7989 Other specified soft tissue disorders: Secondary | ICD-10-CM | POA: Diagnosis not present

## 2024-02-08 DIAGNOSIS — R0602 Shortness of breath: Secondary | ICD-10-CM | POA: Diagnosis not present

## 2024-02-08 DIAGNOSIS — R0789 Other chest pain: Secondary | ICD-10-CM | POA: Diagnosis not present

## 2024-02-08 DIAGNOSIS — E782 Mixed hyperlipidemia: Secondary | ICD-10-CM | POA: Diagnosis not present

## 2024-02-08 MED FILL — TALTZ AUTOINJECTOR 80 MG/ML SUBCUTANEOUS: SUBCUTANEOUS | 28 days supply | Qty: 1 | Fill #4

## 2024-02-09 LAB — COMPREHENSIVE METABOLIC PANEL WITH GFR
ALT: 8 IU/L (ref 0–32)
AST: 19 IU/L (ref 0–40)
Albumin: 3.7 g/dL — ABNORMAL LOW (ref 3.8–4.8)
Alkaline Phosphatase: 83 IU/L (ref 49–135)
BUN/Creatinine Ratio: 11 — ABNORMAL LOW (ref 12–28)
BUN: 11 mg/dL (ref 8–27)
Bilirubin Total: 0.7 mg/dL (ref 0.0–1.2)
CO2: 25 mmol/L (ref 20–29)
Calcium: 9 mg/dL (ref 8.7–10.3)
Chloride: 104 mmol/L (ref 96–106)
Creatinine, Ser: 1.03 mg/dL — ABNORMAL HIGH (ref 0.57–1.00)
Globulin, Total: 2.2 g/dL (ref 1.5–4.5)
Glucose: 119 mg/dL — ABNORMAL HIGH (ref 70–99)
Potassium: 4.2 mmol/L (ref 3.5–5.2)
Sodium: 142 mmol/L (ref 134–144)
Total Protein: 5.9 g/dL — ABNORMAL LOW (ref 6.0–8.5)
eGFR: 56 mL/min/1.73 — ABNORMAL LOW (ref >59)

## 2024-02-10 DIAGNOSIS — J45909 Unspecified asthma, uncomplicated: Secondary | ICD-10-CM | POA: Diagnosis not present

## 2024-02-13 ENCOUNTER — Emergency Department

## 2024-02-13 ENCOUNTER — Inpatient Hospital Stay
Admission: EM | Admit: 2024-02-13 | Discharge: 2024-02-15 | DRG: 190 | Disposition: A | Discharge status: Home / Self Care | Attending: Student in an Organized Health Care Education/Training Program | Admitting: Student in an Organized Health Care Education/Training Program

## 2024-02-13 ENCOUNTER — Other Ambulatory Visit: Payer: Self-pay

## 2024-02-13 DIAGNOSIS — J441 Chronic obstructive pulmonary disease with (acute) exacerbation: Principal | ICD-10-CM | POA: Diagnosis present

## 2024-02-13 DIAGNOSIS — J9601 Acute respiratory failure with hypoxia: Secondary | ICD-10-CM | POA: Diagnosis present

## 2024-02-13 DIAGNOSIS — Z79899 Other long term (current) drug therapy: Secondary | ICD-10-CM | POA: Diagnosis not present

## 2024-02-13 DIAGNOSIS — Z1152 Encounter for screening for COVID-19: Secondary | ICD-10-CM | POA: Diagnosis not present

## 2024-02-13 DIAGNOSIS — I4819 Other persistent atrial fibrillation: Secondary | ICD-10-CM | POA: Diagnosis present

## 2024-02-13 DIAGNOSIS — K219 Gastro-esophageal reflux disease without esophagitis: Secondary | ICD-10-CM | POA: Diagnosis present

## 2024-02-13 DIAGNOSIS — I110 Hypertensive heart disease with heart failure: Secondary | ICD-10-CM | POA: Diagnosis present

## 2024-02-13 DIAGNOSIS — Z803 Family history of malignant neoplasm of breast: Secondary | ICD-10-CM

## 2024-02-13 DIAGNOSIS — Z7901 Long term (current) use of anticoagulants: Secondary | ICD-10-CM

## 2024-02-13 DIAGNOSIS — L409 Psoriasis, unspecified: Secondary | ICD-10-CM | POA: Diagnosis present

## 2024-02-13 DIAGNOSIS — G4733 Obstructive sleep apnea (adult) (pediatric): Secondary | ICD-10-CM | POA: Diagnosis present

## 2024-02-13 DIAGNOSIS — Z885 Allergy status to narcotic agent status: Secondary | ICD-10-CM

## 2024-02-13 DIAGNOSIS — E039 Hypothyroidism, unspecified: Secondary | ICD-10-CM | POA: Diagnosis present

## 2024-02-13 DIAGNOSIS — N179 Acute kidney failure, unspecified: Secondary | ICD-10-CM | POA: Diagnosis present

## 2024-02-13 DIAGNOSIS — Z8249 Family history of ischemic heart disease and other diseases of the circulatory system: Secondary | ICD-10-CM

## 2024-02-13 DIAGNOSIS — J208 Acute bronchitis due to other specified organisms: Secondary | ICD-10-CM | POA: Diagnosis present

## 2024-02-13 DIAGNOSIS — Z23 Encounter for immunization: Secondary | ICD-10-CM

## 2024-02-13 DIAGNOSIS — I5032 Chronic diastolic (congestive) heart failure: Secondary | ICD-10-CM | POA: Diagnosis present

## 2024-02-13 DIAGNOSIS — Z7951 Long term (current) use of inhaled steroids: Secondary | ICD-10-CM

## 2024-02-13 DIAGNOSIS — E785 Hyperlipidemia, unspecified: Secondary | ICD-10-CM | POA: Diagnosis present

## 2024-02-13 DIAGNOSIS — Z87891 Personal history of nicotine dependence: Secondary | ICD-10-CM

## 2024-02-13 DIAGNOSIS — E66813 Obesity, class 3: Secondary | ICD-10-CM | POA: Diagnosis present

## 2024-02-13 DIAGNOSIS — Z881 Allergy status to other antibiotic agents status: Secondary | ICD-10-CM | POA: Diagnosis not present

## 2024-02-13 DIAGNOSIS — Z6841 Body Mass Index (BMI) 40.0 and over, adult: Secondary | ICD-10-CM

## 2024-02-13 DIAGNOSIS — I10 Essential (primary) hypertension: Secondary | ICD-10-CM | POA: Diagnosis present

## 2024-02-13 DIAGNOSIS — J440 Chronic obstructive pulmonary disease with acute lower respiratory infection: Secondary | ICD-10-CM | POA: Diagnosis present

## 2024-02-13 DIAGNOSIS — I252 Old myocardial infarction: Secondary | ICD-10-CM

## 2024-02-13 DIAGNOSIS — F419 Anxiety disorder, unspecified: Secondary | ICD-10-CM | POA: Diagnosis present

## 2024-02-13 DIAGNOSIS — I2510 Atherosclerotic heart disease of native coronary artery without angina pectoris: Secondary | ICD-10-CM | POA: Diagnosis present

## 2024-02-13 DIAGNOSIS — Z79620 Long term (current) use of immunosuppressive biologic: Secondary | ICD-10-CM

## 2024-02-13 HISTORY — DX: Heart failure, unspecified: I50.9

## 2024-02-13 LAB — RESP PANEL BY RT-PCR (RSV, FLU A&B, COVID)  RVPGX2
Influenza A by PCR: NEGATIVE
Influenza B by PCR: NEGATIVE
Resp Syncytial Virus by PCR: NEGATIVE
SARS Coronavirus 2 by RT PCR: NEGATIVE

## 2024-02-13 LAB — TROPONIN I (HIGH SENSITIVITY)
Troponin I (High Sensitivity): 18 ng/L — ABNORMAL HIGH (ref <18)
Troponin I (High Sensitivity): 19 ng/L — ABNORMAL HIGH (ref <18)

## 2024-02-13 LAB — CBC
HCT: 41.9 % (ref 36.0–46.0)
Hemoglobin: 13.7 g/dL (ref 12.0–15.0)
MCH: 27.7 pg (ref 26.0–34.0)
MCHC: 32.7 g/dL (ref 30.0–36.0)
MCV: 84.6 fL (ref 80.0–100.0)
Platelets: 248 K/uL (ref 150–400)
RBC: 4.95 MIL/uL (ref 3.87–5.11)
RDW: 13.6 % (ref 11.5–15.5)
WBC: 6.2 K/uL (ref 4.0–10.5)
nRBC: 0 % (ref 0.0–0.2)

## 2024-02-13 LAB — BASIC METABOLIC PANEL WITH GFR
Anion gap: 11 (ref 5–15)
BUN: 12 mg/dL (ref 8–23)
CO2: 24 mmol/L (ref 22–32)
Calcium: 8.4 mg/dL — ABNORMAL LOW (ref 8.9–10.3)
Chloride: 103 mmol/L (ref 98–111)
Creatinine, Ser: 0.96 mg/dL (ref 0.44–1.00)
GFR, Estimated: >60 mL/min (ref >60)
Glucose, Bld: 125 mg/dL — ABNORMAL HIGH (ref 70–99)
Potassium: 3.7 mmol/L (ref 3.5–5.1)
Sodium: 138 mmol/L (ref 135–145)

## 2024-02-13 LAB — BRAIN NATRIURETIC PEPTIDE: B Natriuretic Peptide: 321.5 pg/mL — ABNORMAL HIGH (ref 0.0–100.0)

## 2024-02-13 MED ORDER — IPRATROPIUM-ALBUTEROL 0.5-2.5 (3) MG/3ML IN SOLN
3.0000 mL | Freq: Four times a day (QID) | RESPIRATORY_TRACT | Status: DC
  Filled 2024-02-13: qty 3

## 2024-02-13 MED ORDER — METHYLPREDNISOLONE SODIUM SUCC 40 MG IJ SOLR
40.0000 mg | Freq: Two times a day (BID) | INTRAMUSCULAR | Status: DC
  Administered 2024-02-13 – 2024-02-15 (×4): 40 mg via INTRAVENOUS
  Filled 2024-02-13 (×4): qty 1

## 2024-02-13 MED ORDER — APIXABAN 5 MG PO TABS
5.0000 mg | ORAL_TABLET | Freq: Two times a day (BID) | ORAL | Status: DC
  Administered 2024-02-13 – 2024-02-15 (×4): 5 mg via ORAL
  Filled 2024-02-13 (×4): qty 1

## 2024-02-13 MED ORDER — BUSPIRONE HCL 15 MG PO TABS
7.5000 mg | ORAL_TABLET | Freq: Two times a day (BID) | ORAL | Status: DC
  Administered 2024-02-13 – 2024-02-15 (×4): 7.5 mg via ORAL
  Filled 2024-02-13 (×4): qty 1

## 2024-02-13 MED ORDER — GUAIFENESIN ER 600 MG PO TB12
1200.0000 mg | ORAL_TABLET | Freq: Two times a day (BID) | ORAL | Status: DC
  Administered 2024-02-13 – 2024-02-15 (×4): 1200 mg via ORAL
  Filled 2024-02-13 (×4): qty 2

## 2024-02-13 MED ORDER — PANTOPRAZOLE SODIUM 40 MG PO TBEC
40.0000 mg | DELAYED_RELEASE_TABLET | Freq: Every day | ORAL | Status: DC
  Administered 2024-02-13 – 2024-02-15 (×3): 40 mg via ORAL
  Filled 2024-02-13 (×4): qty 1

## 2024-02-13 MED ORDER — ENALAPRIL MALEATE 2.5 MG PO TABS
2.5000 mg | ORAL_TABLET | Freq: Every day | ORAL | Status: DC
  Administered 2024-02-13 – 2024-02-15 (×3): 2.5 mg via ORAL
  Filled 2024-02-13 (×3): qty 1

## 2024-02-13 MED ORDER — ACETAMINOPHEN 325 MG PO TABS: 650.0000 mg | ORAL_TABLET | Freq: Four times a day (QID) | ORAL | Status: DC | PRN

## 2024-02-13 MED ORDER — PANTOPRAZOLE SODIUM 40 MG PO TBEC: 40.0000 mg | DELAYED_RELEASE_TABLET | Freq: Every day | ORAL | Status: DC

## 2024-02-13 MED ORDER — INFLUENZA VAC SPLIT HIGH-DOSE 0.5 ML IM SUSY
0.5000 mL | PREFILLED_SYRINGE | INTRAMUSCULAR | Status: AC
  Administered 2024-02-15: 0.5 mL via INTRAMUSCULAR
  Filled 2024-02-13: qty 0.5

## 2024-02-13 MED ORDER — ATORVASTATIN CALCIUM 20 MG PO TABS
40.0000 mg | ORAL_TABLET | Freq: Every day | ORAL | Status: DC
  Administered 2024-02-14 – 2024-02-15 (×2): 40 mg via ORAL
  Filled 2024-02-13 (×2): qty 2

## 2024-02-13 MED ORDER — IPRATROPIUM-ALBUTEROL 0.5-2.5 (3) MG/3ML IN SOLN
3.0000 mL | Freq: Three times a day (TID) | RESPIRATORY_TRACT | Status: DC
  Administered 2024-02-13 – 2024-02-15 (×5): 3 mL via RESPIRATORY_TRACT
  Filled 2024-02-13 (×5): qty 3

## 2024-02-13 MED ORDER — ONDANSETRON HCL 4 MG/2ML IJ SOLN: 4.0000 mg | Freq: Three times a day (TID) | INTRAMUSCULAR | Status: DC | PRN

## 2024-02-13 MED ORDER — IPRATROPIUM-ALBUTEROL 0.5-2.5 (3) MG/3ML IN SOLN
3.0000 mL | Freq: Once | RESPIRATORY_TRACT | Status: AC
  Administered 2024-02-13: 3 mL via RESPIRATORY_TRACT
  Filled 2024-02-13: qty 3

## 2024-02-13 MED ORDER — DAPAGLIFLOZIN PROPANEDIOL 10 MG PO TABS
10.0000 mg | ORAL_TABLET | Freq: Every day | ORAL | Status: DC
  Administered 2024-02-14 – 2024-02-15 (×2): 10 mg via ORAL
  Filled 2024-02-13 (×2): qty 1

## 2024-02-13 MED ORDER — BUDESONIDE 0.5 MG/2ML IN SUSP
0.5000 mg | Freq: Two times a day (BID) | RESPIRATORY_TRACT | Status: DC
  Administered 2024-02-13 – 2024-02-15 (×4): 0.5 mg via RESPIRATORY_TRACT
  Filled 2024-02-13 (×4): qty 2

## 2024-02-13 MED ORDER — FUROSEMIDE 40 MG PO TABS
40.0000 mg | ORAL_TABLET | Freq: Two times a day (BID) | ORAL | Status: DC
  Administered 2024-02-14 – 2024-02-15 (×3): 40 mg via ORAL
  Filled 2024-02-13 (×3): qty 1

## 2024-02-13 MED ORDER — MELATONIN 5 MG PO TABS
5.0000 mg | ORAL_TABLET | Freq: Every evening | ORAL | Status: DC | PRN
Start: 2024-02-13 — End: 2024-02-15
  Administered 2024-02-13: 5 mg via ORAL
  Filled 2024-02-13: qty 1

## 2024-02-13 MED ORDER — FLUTICASONE PROPIONATE 50 MCG/ACT NA SUSP
1.0000 | Freq: Every day | NASAL | Status: DC | PRN
Start: 2024-02-13 — End: 2024-02-15

## 2024-02-13 MED ORDER — IPRATROPIUM-ALBUTEROL 0.5-2.5 (3) MG/3ML IN SOLN
6.0000 mL | Freq: Once | RESPIRATORY_TRACT | Status: AC
  Administered 2024-02-13: 6 mL via RESPIRATORY_TRACT
  Filled 2024-02-13: qty 3

## 2024-02-13 MED ORDER — METHYLPREDNISOLONE SODIUM SUCC 125 MG IJ SOLR
125.0000 mg | Freq: Once | INTRAMUSCULAR | Status: AC
  Administered 2024-02-13: 125 mg via INTRAVENOUS
  Filled 2024-02-13: qty 2

## 2024-02-13 MED ORDER — FUROSEMIDE 10 MG/ML IJ SOLN
20.0000 mg | Freq: Once | INTRAMUSCULAR | Status: AC
  Administered 2024-02-13: 20 mg via INTRAVENOUS
  Filled 2024-02-13: qty 4

## 2024-02-13 MED ORDER — ALBUTEROL SULFATE (2.5 MG/3ML) 0.083% IN NEBU
2.5000 mg | INHALATION_SOLUTION | RESPIRATORY_TRACT | Status: DC | PRN
  Administered 2024-02-14: 2.5 mg via RESPIRATORY_TRACT
  Filled 2024-02-13: qty 3

## 2024-02-13 MED ORDER — FUROSEMIDE 20 MG PO TABS
20.0000 mg | ORAL_TABLET | Freq: Every day | ORAL | Status: DC
  Administered 2024-02-13: 20 mg via ORAL
  Filled 2024-02-13: qty 1

## 2024-02-13 MED ORDER — SODIUM CHLORIDE 0.9 % IV SOLN
1.0000 g | INTRAVENOUS | Status: DC
  Filled 2024-02-13: qty 10

## 2024-02-13 MED ORDER — SODIUM CHLORIDE 0.9 % IV SOLN
2.0000 g | Freq: Once | INTRAVENOUS | Status: AC
  Administered 2024-02-13: 2 g via INTRAVENOUS
  Filled 2024-02-13: qty 20

## 2024-02-13 NOTE — ED Provider Notes (Signed)
 [facility] Provider Note    Event Date/Time   First MD Initiated Contact with Patient 02/13/24 1236     (approximate)   History   Shortness of Breath and Cough   HPI  Patricia Walker is a 78 y.o. female with history of COPD, coronary disease, A-fib on Eliquis  who comes in with shortness of breath and cough.  I reviewed the notes from 10/14 where patient was admitted to the hospitalist.  Patient was treated with meclizine for vertigo.  There was concern for symptomatic bradycardia and patient's metolazone  was held.  Cardiology did not feel there was any indication for pacemaker.  Recent echocardiogram was reassuring  Patient reports having shortness of breath and cough for the past 3 days.  She reports it being productive in nature.  She denies any falls or hitting her head, abdominal pain or other concerns.  She does report she has resumed her metolazone  but does feel like she has been on a little weight has had a little bit of increased fluid.   Physical Exam   Triage Vital Signs: ED Triage Vitals  Encounter Vitals Group     BP 02/13/24 1022 (!) 148/84     Girls Systolic BP Percentile --      Girls Diastolic BP Percentile --      Boys Systolic BP Percentile --      Boys Diastolic BP Percentile --      Pulse Rate 02/13/24 1018 84     Resp 02/13/24 1018 (!) 22     Temp 02/13/24 1018 98.2 F (36.8 C)     Temp Source 02/13/24 1018 Oral     SpO2 02/13/24 1018 91 %     Weight 02/13/24 1020 (!) 304 lb 3.8 oz (138 kg)     Height 02/13/24 1020 5' 5 (1.651 m)     Head Circumference --      Peak Flow --      Pain Score 02/13/24 1019 6     Pain Loc --      Pain Education --      Exclude from Growth Chart --     Most recent vital signs: Vitals:   02/13/24 1018 02/13/24 1022  BP:  (!) 148/84  Pulse: 84   Resp: (!) 22   Temp: 98.2 F (36.8 C)   SpO2: 91%      General: Awake, no distress.  CV:  Good peripheral perfusion.  Resp:  Increased work  of breathing wheezing noted bilaterally Abd:  No distention.  Soft and nontender Other:  Slight edema noted   ED Results / Procedures / Treatments   Labs (all labs ordered are listed, but only abnormal results are displayed) Labs Reviewed  BASIC METABOLIC PANEL WITH GFR - Abnormal; Notable for the following components:      Result Value   Glucose, Bld 125 (*)    Calcium  8.4 (*)    All other components within normal limits  BRAIN NATRIURETIC PEPTIDE - Abnormal; Notable for the following components:   B Natriuretic Peptide 321.5 (*)    All other components within normal limits  CBC     EKG  My interpretation of EKG:  Atrial fibrillation with a rate of 72 without any ST elevation or T wave inversion but does have multiple branch block.  Looks similar to priors.  RADIOLOGY I have reviewed the xray personally and interpreted no evidence of any pneumonia stable cardiomegaly   PROCEDURES:  Critical Care  performed: No  .1-3 Lead EKG Interpretation  Performed by: Ernest Ronal BRAVO, MD Authorized by: Ernest Ronal BRAVO, MD     Interpretation: normal     ECG rate:  80   ECG rate assessment: normal     Rhythm: sinus rhythm     Ectopy: none     Conduction: normal      MEDICATIONS ORDERED IN ED: Medications  ipratropium-albuterol  (DUONEB) 0.5-2.5 (3) MG/3ML nebulizer solution 3 mL (3 mLs Nebulization Given 02/13/24 1303)  cefTRIAXone  (ROCEPHIN ) 2 g in sodium chloride  0.9 % 100 mL IVPB (0 g Intravenous Stopped 02/13/24 1446)  methylPREDNISolone  sodium succinate (SOLU-MEDROL ) 125 mg/2 mL injection 125 mg (125 mg Intravenous Given 02/13/24 1303)  furosemide  (LASIX ) injection 20 mg (20 mg Intravenous Given 02/13/24 1302)     IMPRESSION / MDM / ASSESSMENT AND PLAN / ED COURSE  I reviewed the triage vital signs and the nursing notes.   Patient's presentation is most consistent with acute presentation with potential threat to life or bodily function.   Differential includes COPD,  pneumonia, COVID, CHF.  BNP is slightly elevated.  BMP reassuring CBC reassuring.  Troponin is slightly elevated but flat upon repeat.  Does appear that she has had that previously.  Her COVID test is negative.  Chest x-ray is negative  Given elevated BNP some trace edema patient feeling like she has been more short of breath in the setting of her metolazone  having been hold for some time we will give a small dose of Lasix  to see if we can help diurese her to see if this helps improve symptoms.  Reevaluated patient.  Patient reports still feeling very short of breath.  Attempted to ambulate patient she got very dyspneic desatted down to 89% and reports feeling very unsteady on her feet.  She is still has diffuse wheezing noted on examination.  Given this we will discuss to hospital team for admission due to concerns for COPD exacerbation with probable viral illness but will cover with antibiotics for possible occult pneumonia.  Doubt PE given patient on Eliquis .  The patient is on the cardiac monitor to evaluate for evidence of arrhythmia and/or significant heart rate changes.      FINAL CLINICAL IMPRESSION(S) / ED DIAGNOSES   Final diagnoses:  COPD exacerbation (HCC)  Viral bronchitis     Rx / DC Orders   ED Discharge Orders     None        Note:  This document was prepared using Dragon voice recognition software and [place] include unintentional dictation errors.   Ernest Ronal BRAVO, MD 02/13/24 [phone]

## 2024-02-13 NOTE — ED Notes (Signed)
 At this time, this EDT answered a call light from this pt, requesting to use the bathroom. This EDT, helped lightly assisted this pt to the bathroom. Pt ambulated independently back to bed. Pt is currently in bed, at lowest position & locked. Call light within reach.

## 2024-02-13 NOTE — ED Triage Notes (Signed)
 Pt to ED for cough and SOB. Cough started 3 days ago, SOB since last night. Pt has a deep, wet cough. Hx CHF, a fib. Respirations unlabored but slightly tachypneic. Skin is dry.

## 2024-02-13 NOTE — ED Notes (Signed)
 Ambulated patient from stretcher to bathroom. Patient oxygen saturation dropped to 93%. Patient complained of SOB and dizziness.

## 2024-02-13 NOTE — H&P (Signed)
 History and Physical    Patricia Walker FMW:[id number] DOB: 16-May-1945 DOA: 02/13/2024  DOS: the patient was seen and examined on 02/13/2024  PCP: [name], FNP   Patient coming from: Home  I have personally briefly reviewed patient's old medical records in [facility] and CareEverywhere  HPI:   Patricia Walker is a 78 y.o. year old female with past medical history of Asthma/COPD, CAD, HFpEF, PAF on Eliquis , HTN, esophageal stricture s/p dilatation, hypothyroidism, OSA on CPAP at bedtime, morbid obesity. She was recently admitted 10/14/2 for workup of presyncope and symptomatic bradycardia as well as in 10/2023 for H. influenzae bronchospasm with hypoxia.  She presents to [facility] regional ED with dyspnea and productive cough that began 3 days ago.  She has not had any known sick contacts, does report environmental trigger going into the home and smoked heavily of smoke to participate to furniture.  She endorses bilateral lower extremity swelling, dyspnea at rest, and nausea.  She denies chest pain, palpitations, vomiting, or dizziness.  ED Course: On arrival to [facility] regional ED patient was noted to be afebrile temp 36.8 C, BP 130/80, HR 84, RR 22, SpO2 91% on room air.  She is now satting 95% on 2 L via nasal cannula. CXR obtained and showed no acute cardiopulmonary process.  Labs notable for BNP 321.5, troponin 18-->19 with no reported chest pain, and respiratory swab negative for COVID flu RSV.  She was given Rocephin , Lasix , DuoNebs, and IV Solu-Medrol . TRH contacted for admission.  Review of Systems: As mentioned in the history of present illness. All other systems reviewed and are negative.  Review of Systems  Constitutional:  Positive for malaise/fatigue. Negative for chills and fever.  HENT:  Positive for congestion. Negative for sinus pain and sore throat.   Respiratory:  Positive for cough, sputum production, shortness of breath and wheezing.   Cardiovascular:  Positive  for leg swelling. Negative for chest pain and palpitations.  Gastrointestinal:  Positive for nausea. Negative for abdominal pain, constipation, diarrhea, heartburn and vomiting.  Genitourinary:  Negative for dysuria.  Musculoskeletal:  Negative for falls and myalgias.  Neurological:  Negative for dizziness and weakness.  All other systems reviewed and are negative.   Past Medical History:  Diagnosis Date   Anxiety    Arthritis    Asthma    CHF (congestive heart failure) (HCC)    COPD exacerbation (HCC) 10/08/2020   Coronary artery disease    mild, nonobstructive   Depression    Dyspnea    on exertion   Dysrhythmia    Atrial Fibrillation   GERD (gastroesophageal reflux disease)    Heart murmur    Hyperlipidemia    Hypertension    Hypothyroidism    MI, old 2016   nonobstructive CAD by Cath   Morbid obesity (HCC)    Persistent atrial fibrillation (HCC)    Pneumonia 06/2018   and RSV   Psoriasis    Sleep apnea    compliant with CPAP   Thyroid  disease     Past Surgical History:  Procedure Laterality Date   ARTERY BIOPSY Right 07/21/2017   Procedure: BIOPSY TEMPORAL ARTERY;  Surgeon: Jama Cordella MATSU, MD;  Location: [facility] ORS;  Service: Vascular;  Laterality: Right;   CARDIAC CATHETERIZATION     CARDIOVERSION Right 09/01/2016   Procedure: Cardioversion;  Surgeon: Fernand Denyse LABOR, MD;  Location: [facility] ORS;  Service: Cardiovascular;  Laterality: Right;   CARDIOVERSION N/A 09/09/2016   Procedure: Cardioversion;  Surgeon: Fernand Denyse LABOR, MD;  Location: [facility] ORS;  Service: Cardiovascular;  Laterality: N/A;   CATARACT EXTRACTION W/PHACO Right 03/26/2019   Procedure: CATARACT EXTRACTION PHACO AND INTRAOCULAR LENS PLACEMENT (IOC) RIGHT 6.45, 00:39.9;  Surgeon: Jaye Fallow, MD;  Location: [facility];  Service: Ophthalmology;  Laterality: Right;   CATARACT EXTRACTION W/PHACO Left 04/16/2019   Procedure: CATARACT EXTRACTION PHACO AND INTRAOCULAR LENS PLACEMENT (IOC) LEFT;    3.14, 00:24.9;  Surgeon: Jaye Fallow, MD;  Location: [facility];  Service: Ophthalmology;  Laterality: Left;  sleep apnea-CPAP   COLONOSCOPY WITH PROPOFOL  N/A 08/28/2019   Procedure: COLONOSCOPY WITH PROPOFOL ;  Surgeon: Toledo, Ladell POUR, MD;  Location: [facility] END[facility];  Service: Gastroenterology;  Laterality: N/A;   ELECTROPHYSIOLOGIC STUDY N/A 02/01/2016   Procedure: CARDIOVERSION;  Surgeon: Denyse LABOR Fernand, MD;  Location: [facility];  Service: Cardiovascular;  Laterality: N/A;   ESOPHAGEAL DILATION     ESOPHAGOGASTRODUODENOSCOPY (EGD) WITH PROPOFOL  N/A 02/06/2017   Procedure: ESOPHAGOGASTRODUODENOSCOPY (EGD) WITH PROPOFOL ;  Surgeon: Therisa Bi, MD;  Location: [facility];  Service: Gastroenterology;  Laterality: N/A;   ESOPHAGOGASTRODUODENOSCOPY (EGD) WITH PROPOFOL  N/A 08/28/2019   Procedure: ESOPHAGOGASTRODUODENOSCOPY (EGD) WITH PROPOFOL ;  Surgeon: Toledo, Ladell POUR, MD;  Location: [facility];  Service: Gastroenterology;  Laterality: N/A;   EUS N/A 02/16/2017   Procedure: FULL UPPER ENDOSCOPIC ULTRASOUND (EUS) RADIAL;  Surgeon: Elta Fonda SQUIBB, MD;  Location: [facility];  Service: Gastroenterology;  Laterality: N/A;   LEFT HEART CATH AND CORONARY ANGIOGRAPHY N/A 09/08/2016   Procedure: Left Heart Cath and Coronary Angiography;  Surgeon: Fernand Denyse LABOR, MD;  Location: [facility] INV[facility];  Service: Cardiovascular;  Laterality: N/A;   US  ECHOCARDIOGRAPHY       reports that she quit smoking about 11 years ago. Her smoking use included cigarettes. She has never used smokeless tobacco. She reports that she does not drink alcohol and does not use drugs.  Allergies  Allergen Reactions   Codeine Hives, Itching and Rash   Other Itching and Other (See Comments)    States antibiotic in the past caused itching but can not remember name    Family History  Problem Relation Age of Onset   Breast cancer Paternal Aunt    Other Father    Heart attack Father     Prior to  Admission medications   Medication Sig Start Date End Date Taking? Authorizing Provider  acetaminophen  (TYLENOL ) 325 MG tablet Take 2 tablets (650 mg total) by mouth every 6 (six) hours as needed for mild pain (pain score 1-3) or moderate pain (pain score 4-6). 08/07/23   Jens Durand, MD  albuterol  (VENTOLIN  HFA) 108 (90 Base) MCG/ACT inhaler INHALE 1-2 PUFFS EVERY 4-6 HOURS AS NEEDED FOR SHORTNESS OF BREATH 10/02/23   [name], FNP  atorvastatin  (LIPITOR) 40 MG tablet ONCE A DAY FOR CHOLESTEROL 11/23/22   [name], FNP  dapagliflozin  propanediol (FARXIGA ) 10 MG TABS tablet Take 1 tablet (10 mg total) by mouth daily before breakfast. 01/04/24   Fernand Denyse LABOR, MD  ELIQUIS  5 MG TABS tablet TAKE 1 TABLET BY MOUTH TWICE A DAY 02/01/24   [name], FNP  enalapril  (VASOTEC ) 2.5 MG tablet Take 1 tablet (2.5 mg total) by mouth daily. 02/05/24 02/04/25  Fernand Denyse LABOR, MD  esomeprazole (NEXIUM) 40 MG capsule TAKE 1 CAPSULE BY MOUTH EVERY DAY IN THE MORNING 11/23/23   [name], FNP  fluticasone  (FLONASE ) 50 MCG/ACT nasal spray Place 1 spray into both nostrils daily.  Patient taking differently: Place 1 spray into both nostrils daily as needed. 12/15/23   [name], FNP  furosemide  (LASIX ) 20 MG tablet Take 1 tablet (20 mg total) by mouth daily. 02/05/24 02/04/25  Fernand Denyse LABOR, MD  ipratropium-albuterol  (DUONEB) 0.5-2.5 (3) MG/3ML SOLN Take 3 mLs by nebulization every 6 (six) hours as needed. 08/14/23   [name], FNP  meclizine (ANTIVERT) 12.5 MG tablet Take 1 tablet (12.5 mg total) by mouth 2 (two) times daily as needed for dizziness. 01/23/24   Cox, Amy N, DO  nystatin  powder Apply 1 Application topically 3 (three) times daily as needed. Irritation under breast and groin area 07/12/23   [name], FNP  TALTZ 80 MG/ML SOAJ Inject 80 mg into the skin every 28 (twenty-eight) days.    [provider]  Vibegron  (GEMTESA ) 75 MG TABS Take 1 tablet (75  mg total) by mouth daily. 10/11/23   [name], FNP    Physical Exam: Vitals:   02/13/24 1330 02/13/24 1400 02/13/24 1454 02/13/24 1611  BP: (!) 157/139 (!) 126/104 (!) 157/105 (!) 161/93  Pulse: 77 78 79 61  Resp:   20 16  Temp:   99.1 F (37.3 C) 98.1 F (36.7 C)  TempSrc:   Oral Oral  SpO2: 96% 97% 96% (!) 86%  Weight:      Height:        Physical Exam Vitals and nursing note reviewed.  HENT:     Head: Normocephalic.  Eyes:     Pupils: Pupils are equal, round, and reactive to light.  Cardiovascular:     Rate and Rhythm: Normal rate. Rhythm irregular.  Pulmonary:     Effort: Tachypnea present.     Breath sounds: Wheezing and rhonchi present.  Abdominal:     General: Bowel sounds are normal.     Palpations: Abdomen is soft.  Skin:    General: Skin is warm and dry.     Capillary Refill: Capillary refill takes less than 2 seconds.  Neurological:     General: No focal deficit present.     Mental Status: She is alert.  Psychiatric:        Mood and Affect: Mood normal.        Behavior: Behavior normal.      Labs on Admission: I have personally reviewed following labs and imaging studies  CBC: Recent Labs  Lab 02/13/24 1023  WBC 6.2  HGB 13.7  HCT 41.9  MCV 84.6  PLT 248   Basic Metabolic Panel: Recent Labs  Lab 02/08/24 1024 02/13/24 1023  NA 142 138  K 4.2 3.7  CL 104 103  CO2 25 24  GLUCOSE 119* 125*  BUN 11 12  CREATININE 1.03* 0.96  CALCIUM  9.0 8.4*   GFR: Estimated Creatinine Clearance: 69.3 mL/min (by C-G formula based on SCr of 0.96 mg/dL). Liver Function Tests: Recent Labs  Lab 02/08/24 1024  AST 19  ALT 8  ALKPHOS 83  BILITOT 0.7  PROT 5.9*  ALBUMIN 3.7*   No results for input(s): LIPASE, AMYLASE in the last 168 hours. No results for input(s): AMMONIA in the last 168 hours. Coagulation Profile: No results for input(s): INR, PROTIME in the last 168 hours. Cardiac Enzymes: Recent Labs  Lab 02/13/24 1023  02/13/24 1254  TROPONINIHS 18* 19*   BNP (last 3 results) Recent Labs    11/05/23 0824 01/22/24 1731 02/13/24 1023  BNP 241.8* 144.7* 321.5*   HbA1C: No results for  input(s): HGBA1C in the last 72 hours. CBG: No results for input(s): GLUCAP in the last 168 hours. Lipid Profile: No results for input(s): CHOL, HDL, LDLCALC, TRIG, CHOLHDL, LDLDIRECT in the last 72 hours. Thyroid  Function Tests: No results for input(s): TSH, T4TOTAL, FREET4, T3FREE, THYROIDAB in the last 72 hours. Anemia Panel: No results for input(s): VITAMINB12, FOLATE, FERRITIN, TIBC, IRON, RETICCTPCT in the last 72 hours. Urine analysis:    Component Value Date/Time   COLORURINE YELLOW (A) 01/22/2024 1730   APPEARANCEUR CLEAR (A) 01/22/2024 1730   APPEARANCEUR Clear 07/14/2012 0822   LABSPEC 1.016 01/22/2024 1730   LABSPEC 1.021 07/14/2012 0822   PHURINE 5.0 01/22/2024 1730   GLUCOSEU NEGATIVE 01/22/2024 1730   GLUCOSEU Negative 07/14/2012 0822   HGBUR NEGATIVE 01/22/2024 1730   BILIRUBINUR NEGATIVE 01/22/2024 1730   BILIRUBINUR Negative 01/31/2023 1339   BILIRUBINUR Negative 07/14/2012 0822   KETONESUR NEGATIVE 01/22/2024 1730   PROTEINUR NEGATIVE 01/22/2024 1730   UROBILINOGEN 0.2 01/31/2023 1339   NITRITE NEGATIVE 01/22/2024 1730   LEUKOCYTESUR NEGATIVE 01/22/2024 1730   LEUKOCYTESUR Trace 07/14/2012 0822    Radiological Exams on Admission: I have personally reviewed images DG Chest 2 View Result Date: 02/13/2024 EXAM: 2 VIEW(S) XRAY OF THE CHEST 02/13/2024 10:42:00 AM COMPARISON: 01/22/2024 CLINICAL HISTORY: SOB FINDINGS: LUNGS AND PLEURA: No focal pulmonary opacity. No pulmonary edema. No pleural effusion. No pneumothorax. HEART AND MEDIASTINUM: Mild cardiomegaly, stable. Calcified aorta. BONES AND SOFT TISSUES: Thoracic degenerative changes. IMPRESSION: 1. No acute cardiopulmonary process. 2. Mild cardiomegaly, stable. Electronically signed by: Norleen Boxer MD  02/13/2024 12:06 PM EST RP Workstation: [id number]     Assessment/Plan Principal Problem:   COPD with acute exacerbation (HCC)    ##COPD Exacerbation ##Acute Hypoxic Respiratory Failure - IV Solumedrol - Scheduled Pulmicort  and Duonebs  - As needed albuterol  - Continue Rocephin  - Add on Sputum culture and 20 Pathogen panel - Mucinex  - Supplemental O2 as needed - Pulmonary toilet: Mobilize/incentive spirometry/flutter valve   #Chronic heart failure with preserved ejection fraction #CAD - s/p 20 mg IV lasix  in ED, patient has trace bilateral lower extremity edema, will continue PO lasix  - Continue home Lasix , Farxiga , enalapril   # Hyperlipidemia - Continue home Lipitor  #Paroxysmal atrial fibrillation - Continue home Eliquis   # Hypothyroidism - Continue home Synthroid   # Hypertension - Continue home enalapril   # Obstructive sleep apnea Does not currently wear CPAP at home however has tolerated CPAP while inpatient recently and has requested to wear it while here -  at bedtime CPAP   VTE prophylaxis:  Eliquis   GI prophylaxis: Protonix  Diet: Heart healthy Access: PIV Lines: None Code Status:  Full Code Telemetry: Yes  Admission status: Inpatient, Telemetry bed Patient is from: Home Anticipated d/c is to: Home Anticipated d/c date is: 2-3 days Patient currently: Receiving IV steroids, IV antibiotics, and on supplemental O2  Family Communication: No family at bedside. Plan of care discussed with patient at bedside.  Questions welcomed and elicited.  Questions answered to patient's expressed satisfaction.  Patient is cognitively able to update family independently if desired.    Severity of Illness: The appropriate patient status for this patient is INPATIENT. Inpatient status is judged to be reasonable and necessary in order to provide the required intensity of service to ensure the patient's safety. The patient's presenting symptoms, physical exam findings, and  initial radiographic and laboratory data in the context of their chronic comorbidities is felt to place them at high risk for further clinical deterioration. Furthermore, it  is not anticipated that the patient will be medically stable for discharge from the hospital within 2 midnights of admission.   * I certify that at the point of admission it is my clinical judgment that the patient will require inpatient hospital care spanning beyond 2 midnights from the point of admission due to high intensity of service, high risk for further deterioration and high frequency of surveillance required.*  To reach the provider On-Call:   7AM- 7PM see care teams to locate the attending and reach out to them via [email or web address] Password: [email or web address] 7PM-7AM contact night-coverage If you still have difficulty reaching the appropriate provider, please page the [facility] (Director on Call) for [facility] on amion for assistance  This document was prepared using Conservation officer, historic buildings and [place] include unintentional dictation errors.  Rockie Rams FNP-BC, PMHNP-BC Nurse Practitioner [facility]

## 2024-02-14 ENCOUNTER — Telehealth: Payer: Self-pay

## 2024-02-14 DIAGNOSIS — J441 Chronic obstructive pulmonary disease with (acute) exacerbation: Secondary | ICD-10-CM | POA: Diagnosis not present

## 2024-02-14 LAB — MAGNESIUM: Magnesium: 2.1 mg/dL (ref 1.7–2.4)

## 2024-02-14 LAB — RESPIRATORY PANEL BY PCR

## 2024-02-14 LAB — CBC
HCT: 41 % (ref 36.0–46.0)
Hemoglobin: 13.6 g/dL (ref 12.0–15.0)
MCH: 27.8 pg (ref 26.0–34.0)
MCHC: 33.2 g/dL (ref 30.0–36.0)
MCV: 83.7 fL (ref 80.0–100.0)
Platelets: 231 K/uL (ref 150–400)
RBC: 4.9 MIL/uL (ref 3.87–5.11)
RDW: 13.5 % (ref 11.5–15.5)
WBC: 6.6 K/uL (ref 4.0–10.5)
nRBC: 0 % (ref 0.0–0.2)

## 2024-02-14 LAB — BASIC METABOLIC PANEL WITH GFR
Anion gap: 10 (ref 5–15)
BUN: 20 mg/dL (ref 8–23)
CO2: 25 mmol/L (ref 22–32)
Calcium: 8.5 mg/dL — ABNORMAL LOW (ref 8.9–10.3)
Chloride: 102 mmol/L (ref 98–111)
Creatinine, Ser: 0.93 mg/dL (ref 0.44–1.00)
GFR, Estimated: >60 mL/min (ref >60)
Glucose, Bld: 195 mg/dL — ABNORMAL HIGH (ref 70–99)
Potassium: 3.5 mmol/L (ref 3.5–5.1)
Sodium: 137 mmol/L (ref 135–145)

## 2024-02-14 LAB — HIV ANTIBODY (ROUTINE TESTING W REFLEX): HIV Screen 4th Generation wRfx: NONREACTIVE

## 2024-02-14 NOTE — Evaluation (Signed)
 Physical Therapy Evaluation Patient Details Name: Patricia Walker MRN: [id number] DOB: 1945/08/11 Today's Date: 02/14/2024  History of Present Illness  Pt is a 78 y.o. female presented with worsening of cough, wheezing, shortness of breath. MD assessment includes Acute hypoxic respiratory failure and acute COPD exacerbation. PMH significant for asthma, DOE, CAD, depression, HTN, MI, PAF, morbid obesity, & AFIB   Clinical Impression  Pt A&Ox4, pleasant and agreeable to participate in PT evaluation. At baseline, pt is modI with [facility] for ambulation, IND with ADLs, denies hx of falls. Pt was received in bed, able to perform below functional mobility tasks with no physical assistance, though noted to be fatigued with short bouts of mobility. Pt declined ambulation at time of PT eval due to completing walk test with RN just prior to author entering room. HR and SpO2 monitored during session, remained WFL throughout with pt on 2L [place]. Pt was left seated in recliner at end of session with all needs in reach, RN present in room. Pt would benefit from skilled PT intervention to improve activity tolerance for ease of performing functional mobility and ADLs.         If plan is discharge home, recommend the following: A little help with walking and/or transfers;A little help with bathing/dressing/bathroom;Assist for transportation;Help with stairs or ramp for entrance   Can travel by private vehicle        Equipment Recommendations None recommended by PT (pt has recommended DME)  Recommendations for Other Services       Functional Status Assessment Patient has had a recent decline in their functional status and demonstrates the ability to make significant improvements in function in a reasonable and predictable amount of time.     Precautions / Restrictions Precautions Precautions: Fall Recall of Precautions/Restrictions: Intact Restrictions Weight Bearing Restrictions Per Provider Order: No       Mobility  Bed Mobility Overal bed mobility: Modified Independent             General bed mobility comments: increased time/effort to complete but no physical assistance required    Transfers Overall transfer level: Needs assistance Equipment used: Straight cane Transfers: Bed to chair/wheelchair/BSC     Step pivot transfers: Contact guard assist       General transfer comment: Step-pivot transfer to recliner with CGA, no LOB throughout, SPC in R hand.    Ambulation/Gait               General Gait Details: Pt declined additional ambulation, had just finished walk test with RN before PT evaluation  Stairs            Wheelchair Mobility     Tilt Bed    Modified Rankin (Stroke Patients Only)       Balance Overall balance assessment: Needs assistance Sitting-balance support: Feet supported Sitting balance-Leahy Scale: Good Sitting balance - Comments: steady static and dynamic sitting   Standing balance support: Single extremity supported Standing balance-Leahy Scale: Fair Standing balance comment: mild increased postural sway with unilateral UE support                             Pertinent Vitals/Pain Pain Assessment Pain Assessment: No/denies pain    Home Living Family/patient expects to be discharged to:: Private residence Living Arrangements: Other relatives (grandchildren, age 66 and 34) Available Help at Discharge: Family;Available 24 hours/day Type of Home: House Home Access: Stairs to enter Entrance Stairs-Rails: Right;Left;Can reach both Entrance  Stairs-Number of Steps: 3   Home Layout: One level Home Equipment: Agricultural Consultant (2 wheels);Rollator (4 wheels);Cane - single point      Prior Function Prior Level of Function : Independent/Modified Independent;Driving             Mobility Comments: Pt reports SPC use for ambulation, mostly household ambulator, denies hx of falls ADLs Comments: IND with ADLs      Extremity/Trunk Assessment   Upper Extremity Assessment Upper Extremity Assessment: Defer to OT evaluation    Lower Extremity Assessment Lower Extremity Assessment: Generalized weakness       Communication   Communication Communication: No apparent difficulties    Cognition Arousal: Alert Behavior During Therapy: WFL for tasks assessed/performed   PT - Cognitive impairments: No apparent impairments                       PT - Cognition Comments: A&Ox4, pleasant and cooperative Following commands: Intact       Cueing Cueing Techniques: Verbal cues     General Comments      Exercises Other Exercises Other Exercises: HR and SpO2 monitored during session with pt on 2L [place] throughout: SpO2 95% HR in mid 80s after transfer to sitting, SpO2 96% HR mid to high 90s after transfer to chair.   Assessment/Plan    PT Assessment Patient needs continued PT services  PT Problem List Decreased strength;Decreased activity tolerance;Decreased balance;Decreased mobility;Cardiopulmonary status limiting activity       PT Treatment Interventions DME instruction;Gait training;Stair training;Functional mobility training;Therapeutic activities;Therapeutic exercise;Balance training;Patient/family education    PT Goals (Current goals can be found in the Care Plan section)  Acute Rehab PT Goals Patient Stated Goal: to go home PT Goal Formulation: With patient Time For Goal Achievement: 02/28/24 Potential to Achieve Goals: Fair    Frequency Min 2X/week     Co-evaluation               AM-PAC PT 6 Clicks Mobility  Outcome Measure Help needed turning from your back to your side while in a flat bed without using bedrails?: A Little Help needed moving from lying on your back to sitting on the side of a flat bed without using bedrails?: A Little Help needed moving to and from a bed to a chair (including a wheelchair)?: A Little Help needed standing up from a chair using  your arms (e.g., wheelchair or bedside chair)?: A Little Help needed to walk in hospital room?: A Little Help needed climbing 3-5 steps with a railing? : A Lot 6 Click Score: 17    End of Session Equipment Utilized During Treatment: Gait belt;Oxygen Activity Tolerance: Patient limited by fatigue Patient left: in chair;with call bell/phone within reach;with chair alarm set;with nursing/sitter in room Nurse Communication: Mobility status PT Visit Diagnosis: Unsteadiness on feet (R26.81);Muscle weakness (generalized) (M62.81);Difficulty in walking, not elsewhere classified (R26.2)    Time: 9145-9088 PT Time Calculation (min) (ACUTE ONLY): 17 min   Charges:   PT Evaluation $PT Eval Low Complexity: 1 Low   PT General Charges $$ ACUTE PT VISIT: 1 Visit         Janell Axe, SPT

## 2024-02-14 NOTE — Progress Notes (Signed)
 PROGRESS NOTE Patricia Walker    DOB: Nov 21, 1945, 78 y.o.  FMW:[id number]    Code Status: Full Code   DOA: 02/13/2024   LOS: 1  Brief hospital course  Patricia Walker is a 78 y.o. female with a PMH significant for Asthma/COPD, CAD, HFpEF, PAF on Eliquis , HTN, esophageal stricture s/p dilatation, hypothyroidism, OSA on CPAP at bedtime, morbid obesity. She was recently admitted 10/14/2 for workup of presyncope and symptomatic bradycardia as well as in 10/2023 for H. influenzae bronchospasm with hypoxia.  She presents to [facility] regional ED with dyspnea and productive cough that began 3 days ago.    ED Course: temp 36.8 C, BP 130/80, HR 84, RR 22, SpO2 91% on room air. CXR obtained and showed no acute cardiopulmonary process.  Labs notable for BNP 321.5, troponin 18-->19 with no reported chest pain, and respiratory swab negative for COVID flu RSV.  She was given Rocephin , Lasix , DuoNebs, and IV Solu-Medrol .   Patient was admitted to medicine service for further workup and management of COPD exacerbation as outlined in detail below.  02/14/24 -significant dyspnea with minimal exertion and still hypoxic at rest  Assessment & Plan  Principal Problem:   COPD with acute exacerbation (HCC) Active Problems:   Coronary artery disease   Obstructive sleep apnea syndrome   Chronic heart failure with preserved ejection fraction (HFpEF) (HCC)   Hypertension   Hypothyroidism (acquired)   Acute respiratory failure with hypoxia (HCC)   Gastroesophageal reflux disease   Hyperlipidemia, unspecified  COPD Exacerbation- RVP positive for rhinovirus Acute Hypoxic Respiratory Failure -continue IV Solumedrol - Scheduled Pulmicort  and Duonebs  - As needed albuterol  - disontinue Rocephin  as there is positive viral panel - Mucinex  - Supplemental O2 as needed - Pulmonary toilet: Mobilize/incentive spirometry/flutter valve  - ambulate with pulse ox again in the morning and anticipate dc tomorrow if can wean from O2  or possible home with O2 if dyspnea improves   #Chronic heart failure with preserved ejection fraction #CAD - s/p 20 mg IV lasix  in ED, patient has trace bilateral lower extremity edema, will continue PO lasix  - Continue home Lasix , Farxiga , enalapril    # Hyperlipidemia - Continue home Lipitor   #Paroxysmal atrial fibrillation - Continue home Eliquis    # Hypothyroidism - Continue home Synthroid    # Hypertension - Continue home enalapril    # Obstructive sleep apnea Does not currently wear CPAP at home however has tolerated CPAP while inpatient recently and has requested to wear it while here -  at bedtime CPAP   Body mass index is 50.63 kg/m.  VTE ppx:  apixaban  (ELIQUIS ) tablet 5 mg   Diet:     Diet   Diet Heart Room service appropriate? Yes; Fluid consistency: Thin   Consultants: None   Subjective 02/14/24    Pt reports feeling significant dyspnea. Uncomfortable lower back sitting in chair. Significant cough still.   Objective  Blood pressure 130/72, pulse 78, temperature 98.7 F (37.1 C), resp. rate 16, height 5' 5 (1.651 m), weight (!) 138 kg, SpO2 92%.  Intake/Output Summary (Last 24 hours) at 02/14/2024 0733 Last data filed at 02/13/2024 2330 Gross per 24 hour  Intake 580 ml  Output --  Net 580 ml   Filed Weights   02/13/24 1020  Weight: (!) 138 kg    Physical Exam:  General: awake, alert, NAD HEENT: atraumatic, clear conjunctiva, anicteric sclera, MMM, hearing grossly normal Respiratory: normal respiratory effort. Speaking in short sentences. Scattered inspiratory wheezing. Wet cough.  Cardiovascular: extremities well perfused, quick capillary refill, normal S1/S2, RRR, no JVD, murmurs Nervous: A&O x3. no gross focal neurologic deficits, normal speech Extremities: moves all equally, no edema, normal tone Skin: dry, intact, normal temperature, normal color. No rashes, lesions or ulcers on exposed skin Psychiatry: normal mood, congruent  affect  Labs   I have personally reviewed the following labs and imaging studies CBC    Component Value Date/Time   WBC 6.6 02/14/2024 0430   RBC 4.90 02/14/2024 0430   HGB 13.6 02/14/2024 0430   HGB 14.1 12/22/2023 1359   HCT 41.0 02/14/2024 0430   HCT 43.0 12/22/2023 1359   PLT 231 02/14/2024 0430   PLT 298 12/22/2023 1359   MCV 83.7 02/14/2024 0430   MCV 87 12/22/2023 1359   MCV 88 04/18/2014 1115   MCH 27.8 02/14/2024 0430   MCHC 33.2 02/14/2024 0430   RDW 13.5 02/14/2024 0430   RDW 14.2 12/22/2023 1359   RDW 13.5 04/18/2014 1115   LYMPHSABS 1.3 12/22/2023 1359   LYMPHSABS 1.9 05/20/2012 0455   MONOABS 0.8 02/02/2023 1834   MONOABS 0.5 05/20/2012 0455   EOSABS 0.1 12/22/2023 1359   EOSABS 0.2 05/20/2012 0455   BASOSABS 0.1 12/22/2023 1359   BASOSABS 0.0 05/20/2012 0455      Latest Ref Rng & Units 02/14/2024    4:30 AM 02/13/2024   10:23 AM 02/08/2024   10:24 AM  BMP  Glucose 70 - 99 mg/dL 804  874  880   BUN 8 - 23 mg/dL 20  12  11    Creatinine 0.44 - 1.00 mg/dL 9.06  9.03  8.96   BUN/Creat Ratio 12 - 28   11   Sodium 135 - 145 mmol/L 137  138  142   Potassium 3.5 - 5.1 mmol/L 3.5  3.7  4.2   Chloride 98 - 111 mmol/L 102  103  104   CO2 22 - 32 mmol/L 25  24  25    Calcium  8.9 - 10.3 mg/dL 8.5  8.4  9.0    DG Chest 2 View Result Date: 02/13/2024 EXAM: 2 VIEW(S) XRAY OF THE CHEST 02/13/2024 10:42:00 AM COMPARISON: 01/22/2024 CLINICAL HISTORY: SOB FINDINGS: LUNGS AND PLEURA: No focal pulmonary opacity. No pulmonary edema. No pleural effusion. No pneumothorax. HEART AND MEDIASTINUM: Mild cardiomegaly, stable. Calcified aorta. BONES AND SOFT TISSUES: Thoracic degenerative changes. IMPRESSION: 1. No acute cardiopulmonary process. 2. Mild cardiomegaly, stable. Electronically signed by: Norleen Boxer MD 02/13/2024 12:06 PM EST RP Workstation: [id number]   Disposition Plan & Communication  Patient status: Inpatient  Admitted From: Home Planned disposition location:  Home Anticipated discharge date: 11/6 pending oxygen status  Family Communication: none at bedside    Author: Marien LITTIE Piety, DO [facility] 02/14/2024, 7:33 AM   Available by Epic secure chat 7AM-7PM. If 7PM-7AM, please contact night-coverage.  [facility] contact information found on [email or web address].

## 2024-02-14 NOTE — Plan of Care (Signed)

## 2024-02-14 NOTE — Progress Notes (Signed)
 Educated patient on having an AD completed. Patient does not believe this is needed at this time. Chaplain offered support and compassion to patient while visiting.   Zakar Brosch Chaplain Intern-[facility]

## 2024-02-14 NOTE — Progress Notes (Signed)
SATURATION QUALIFICATIONS: (This note is used to comply with regulatory documentation for home oxygen)  Patient Saturations on Room Air at Rest = 94%  Patient Saturations on Room Air while Ambulating = 88%  Patient Saturations on 2 Liters of oxygen while Ambulating = 96%  Please briefly explain why patient needs home oxygen: 

## 2024-02-14 NOTE — Progress Notes (Signed)
 Pt did not feel like she received enough medication.  A unit dose of albuterol  was pulled and given to her

## 2024-02-14 NOTE — Evaluation (Signed)
 Occupational Therapy Evaluation Patient Details Name: Patricia Walker Payment MRN: [id number] DOB: 01/31/1946 Today's Date: 02/14/2024   History of Present Illness   Pt is a 78 y.o. female presented with worsening of cough, wheezing, shortness of breath. MD assessment includes Acute hypoxic respiratory failure and acute COPD exacerbation. PMH significant for asthma, DOE, CAD, depression, HTN, MI, PAF, morbid obesity, & AFIB     Clinical Impressions Chart reviewed to date, pt greeted sitting in chair, alert and oriented x4, agreeable to OT evaluation. PTA pt reports she is MOD I-I in ADL/IADL, amb limited community distances with SPC. She presents with deficits in strength, endurance, activity tolerance, affecting safe and optimal ADL completion. She amb in room pprox 15' with [facility] with supervision, standing grooming tasks for approx 3 minutes with supervision, then requires seated rest break due to SOB. Pt is educated re; energy conservation techniques and safe ADL completion. Pt is performing ADL/functional mobility below PLOF, will benefit from acute OT to address functional deficits and to facilitate return to PLOF. Pt is left as received, all needs met. OT will follow.      If plan is discharge home, recommend the following:   A little help with walking and/or transfers;A little help with bathing/dressing/bathroom     Functional Status Assessment   Patient has had a recent decline in their functional status and demonstrates the ability to make significant improvements in function in a reasonable and predictable amount of time.     Equipment Recommendations   None recommended by OT     Recommendations for Other Services         Precautions/Restrictions   Precautions Precautions: Fall Recall of Precautions/Restrictions: Intact Restrictions Weight Bearing Restrictions Per Provider Order: No     Mobility Bed Mobility               General bed mobility comments: NT in  recliner pre/post session    Transfers Overall transfer level: Needs assistance Equipment used: Straight cane Transfers: Sit to/from Stand Sit to Stand: Supervision                  Balance Overall balance assessment: Needs assistance Sitting-balance support: Feet supported Sitting balance-Leahy Scale: Good     Standing balance support: Single extremity supported Standing balance-Leahy Scale: Fair                             ADL either performed or assessed with clinical judgement   ADL Overall ADL's : Needs assistance/impaired     Grooming: Supervision/safety;Standing;Wash/dry face;Oral care;Brushing hair;Sitting; washing hair  Grooming Details (indicate cue type and reason): sitting after approx 3 min due to SOB             Lower Body Dressing: Supervision/safety;Sitting/lateral leans   Toilet Transfer: Supervision/safety;Ambulation Toilet Transfer Details (indicate cue type and reason): simulated         Functional mobility during ADLs: Supervision/safety (approx 15' two attempts with [facility])       Vision Patient Visual Report: No change from baseline       Perception         Praxis         Pertinent Vitals/Pain Pain Assessment Pain Assessment: Faces Faces Pain Scale: Hurts a little bit Pain Location: LBP with prolonged standing Pain Descriptors / Indicators: Discomfort Pain Intervention(s): Monitored during session, Repositioned     Extremity/Trunk Assessment Upper Extremity Assessment Upper Extremity Assessment: Overall WFL for tasks  assessed   Lower Extremity Assessment Lower Extremity Assessment: Generalized weakness       Communication Communication Communication: No apparent difficulties   Cognition Arousal: Alert Behavior During Therapy: WFL for tasks assessed/performed Cognition: No apparent impairments                               Following commands: Intact       Cueing  General  Comments   Cueing Techniques: Verbal cues  spo2 >90% on 2L via [place] throughout, HR in 90s bpm post mobility   Exercises Other Exercises Other Exercises: edu re role of OT, role of rehab, energy conservation techniques for increased ease/safety of ADLs   Shoulder Instructions      Home Living Family/patient expects to be discharged to:: Private residence Living Arrangements: Other relatives (grandchildren 51 and 63) Available Help at Discharge: Family;Available 24 hours/day Type of Home: House Home Access: Stairs to enter [employer] of Steps: 3 Entrance Stairs-Rails: Right;Left;Can reach both Home Layout: One level     Bathroom Shower/Tub: Chief Strategy Officer: Standard Bathroom Accessibility: Yes   Home Equipment: Agricultural Consultant (2 wheels);Rollator (4 wheels);Cane - single point;Shower seat          Prior Functioning/Environment Prior Level of Function : Independent/Modified Independent;Driving             Mobility Comments: amb with SPC ADLs Comments: MOD I-I with ADL/IADL    OT Problem List: Decreased activity tolerance;Cardiopulmonary status limiting activity;Decreased strength;Decreased knowledge of use of DME or AE   OT Treatment/Interventions: Self-care/ADL training;Balance training;Therapeutic exercise;Therapeutic activities;Energy conservation;DME and/or AE instruction;Patient/family education      OT Goals(Current goals can be found in the care plan section)   Acute Rehab OT Goals Patient Stated Goal: go home OT Goal Formulation: With patient Time For Goal Achievement: 02/28/24 Potential to Achieve Goals: Good ADL Goals Pt Will Perform Grooming: with modified independence;sitting;standing Pt Will Perform Lower Body Dressing: with modified independence;sitting/lateral leans;sit to/from stand Pt Will Transfer to Toilet: with modified independence;ambulating Pt Will Perform Toileting - Clothing Manipulation and hygiene: with  modified independence;sitting/lateral leans;sit to/from stand   OT Frequency:  Min 2X/week    Co-evaluation              AM-PAC OT 6 Clicks Daily Activity     Outcome Measure Help from another person eating meals?: None Help from another person taking care of personal grooming?: None Help from another person toileting, which includes using toliet, bedpan, or urinal?: None Help from another person bathing (including washing, rinsing, drying)?: A Little Help from another person to put on and taking off regular upper body clothing?: None Help from another person to put on and taking off regular lower body clothing?: A Little 6 Click Score: 22   End of Session Equipment Utilized During Treatment: Other (comment);Oxygen [facility]) Nurse Communication: Mobility status  Activity Tolerance: Patient tolerated treatment well Patient left: in chair;with call bell/phone within reach  OT Visit Diagnosis: Other abnormalities of gait and mobility (R26.89)                Time: 8583-8553 OT Time Calculation (min): 30 min Charges:  OT General Charges $OT Visit: 1 Visit OT Evaluation $OT Eval Low Complexity: 1 Low  Therisa Sheffield, OTD OTR/L  02/14/24, 3:20 PM

## 2024-02-15 ENCOUNTER — Other Ambulatory Visit: Payer: Self-pay

## 2024-02-15 DIAGNOSIS — J441 Chronic obstructive pulmonary disease with (acute) exacerbation: Secondary | ICD-10-CM | POA: Diagnosis not present

## 2024-02-15 LAB — BASIC METABOLIC PANEL WITH GFR
Anion gap: 9 (ref 5–15)
BUN: 29 mg/dL — ABNORMAL HIGH (ref 8–23)
CO2: 26 mmol/L (ref 22–32)
Calcium: 8.5 mg/dL — ABNORMAL LOW (ref 8.9–10.3)
Chloride: 103 mmol/L (ref 98–111)
Creatinine, Ser: 1.28 mg/dL — ABNORMAL HIGH (ref 0.44–1.00)
GFR, Estimated: 43 mL/min — ABNORMAL LOW (ref >60)
Glucose, Bld: 226 mg/dL — ABNORMAL HIGH (ref 70–99)
Potassium: 4.4 mmol/L (ref 3.5–5.1)
Sodium: 138 mmol/L (ref 135–145)

## 2024-02-15 LAB — CBC
HCT: 43.6 % (ref 36.0–46.0)
Hemoglobin: 13.6 g/dL (ref 12.0–15.0)
MCH: 27.3 pg (ref 26.0–34.0)
MCHC: 31.2 g/dL (ref 30.0–36.0)
MCV: 87.4 fL (ref 80.0–100.0)
Platelets: 317 K/uL (ref 150–400)
RBC: 4.99 MIL/uL (ref 3.87–5.11)
RDW: 13.6 % (ref 11.5–15.5)
WBC: 14.1 K/uL — ABNORMAL HIGH (ref 4.0–10.5)
nRBC: 0 % (ref 0.0–0.2)

## 2024-02-15 MED ORDER — IPRATROPIUM-ALBUTEROL 0.5-2.5 (3) MG/3ML IN SOLN: 3.0000 mL | Freq: Two times a day (BID) | RESPIRATORY_TRACT | Status: DC

## 2024-02-15 MED ORDER — ALBUTEROL SULFATE HFA 108 (90 BASE) MCG/ACT IN AERS
1.0000 | INHALATION_SPRAY | RESPIRATORY_TRACT | 6 refills | Status: AC | PRN
  Filled 2024-02-15: qty 18, 30d supply, fill #0

## 2024-02-15 MED ORDER — DM-GUAIFENESIN ER 30-600 MG PO TB12
1.0000 | ORAL_TABLET | Freq: Two times a day (BID) | ORAL | 0 refills | Status: AC
  Filled 2024-02-15: qty 20, 10d supply, fill #0

## 2024-02-15 MED ORDER — SPIRIVA RESPIMAT 2.5 MCG/ACT IN AERS
2.0000 | INHALATION_SPRAY | Freq: Every day | RESPIRATORY_TRACT | 0 refills | Status: AC
  Filled 2024-02-15: qty 4, 30d supply, fill #0

## 2024-02-15 NOTE — Progress Notes (Incomplete)
 PROGRESS NOTE Patricia Walker    DOB: 1945/05/01, 78 y.o.  FMW:[id number]    Code Status: Full Code   DOA: 02/13/2024   LOS: 2  Brief hospital course  Patricia Walker is a 78 y.o. female with a PMH significant for Asthma/COPD, CAD, HFpEF, PAF on Eliquis , HTN, esophageal stricture s/p dilatation, hypothyroidism, OSA on CPAP at bedtime, morbid obesity. She was recently admitted 10/14/2 for workup of presyncope and symptomatic bradycardia as well as in 10/2023 for H. influenzae bronchospasm with hypoxia.  She presents to [facility] regional ED with dyspnea and productive cough that began 3 days ago.    ED Course: temp 36.8 C, BP 130/80, HR 84, RR 22, SpO2 91% on room air. CXR obtained and showed no acute cardiopulmonary process.  Labs notable for BNP 321.5, troponin 18-->19 with no reported chest pain, and respiratory swab negative for COVID flu RSV.  She was given Rocephin , Lasix , DuoNebs, and IV Solu-Medrol .   Patient was admitted to medicine service for further workup and management of COPD exacerbation as outlined in detail below.  02/15/24 -significant dyspnea with minimal exertion and still hypoxic at rest  Assessment & Plan  Principal Problem:   COPD with acute exacerbation (HCC) Active Problems:   Coronary artery disease   Obstructive sleep apnea syndrome   Chronic heart failure with preserved ejection fraction (HFpEF) (HCC)   Hypertension   Hypothyroidism (acquired)   Acute respiratory failure with hypoxia (HCC)   Gastroesophageal reflux disease   Hyperlipidemia, unspecified  COPD Exacerbation- RVP positive for rhinovirus Acute Hypoxic Respiratory Failure -continue IV Solumedrol - Scheduled Pulmicort  and Duonebs  - As needed albuterol  - disontinue Rocephin  as there is positive viral panel - Mucinex  - Supplemental O2 as needed - Pulmonary toilet: Mobilize/incentive spirometry/flutter valve  - ambulate with pulse ox again in the morning and anticipate dc tomorrow if can wean from O2  or possible home with O2 if dyspnea improves   #Chronic heart failure with preserved ejection fraction #CAD - s/p 20 mg IV lasix  in ED, patient has trace bilateral lower extremity edema, will continue PO lasix  - Continue home Lasix , Farxiga , enalapril    # Hyperlipidemia - Continue home Lipitor   #Paroxysmal atrial fibrillation - Continue home Eliquis    # Hypothyroidism - Continue home Synthroid    # Hypertension - Continue home enalapril    # Obstructive sleep apnea Does not currently wear CPAP at home however has tolerated CPAP while inpatient recently and has requested to wear it while here -  at bedtime CPAP   Body mass index is 50.63 kg/m.  VTE ppx:  apixaban  (ELIQUIS ) tablet 5 mg   Diet:     Diet   Diet Heart Room service appropriate? Yes; Fluid consistency: Thin   Consultants: None   Subjective 02/15/24    Pt reports feeling significant dyspnea. Uncomfortable lower back sitting in chair. Significant cough still.   Objective  Blood pressure 130/72, pulse 78, temperature 98.7 F (37.1 C), resp. rate 16, height 5' 5 (1.651 m), weight (!) 138 kg, SpO2 92%.  Intake/Output Summary (Last 24 hours) at 02/15/2024 0719 Last data filed at 02/14/2024 1034 Gross per 24 hour  Intake 480 ml  Output --  Net 480 ml   Filed Weights   02/13/24 1020  Weight: (!) 138 kg    Physical Exam:  General: awake, alert, NAD HEENT: atraumatic, clear conjunctiva, anicteric sclera, MMM, hearing grossly normal Respiratory: normal respiratory effort. Speaking in short sentences. Scattered inspiratory wheezing. Wet cough.  Cardiovascular: extremities well perfused, quick capillary refill, normal S1/S2, RRR, no JVD, murmurs Nervous: A&O x3. no gross focal neurologic deficits, normal speech Extremities: moves all equally, no edema, normal tone Skin: dry, intact, normal temperature, normal color. No rashes, lesions or ulcers on exposed skin Psychiatry: normal mood, congruent  affect  Labs   I have personally reviewed the following labs and imaging studies CBC    Component Value Date/Time   WBC 14.1 (H) 02/15/2024 0548   RBC 4.99 02/15/2024 0548   HGB 13.6 02/15/2024 0548   HGB 14.1 12/22/2023 1359   HCT 43.6 02/15/2024 0548   HCT 43.0 12/22/2023 1359   PLT 317 02/15/2024 0548   PLT 298 12/22/2023 1359   MCV 87.4 02/15/2024 0548   MCV 87 12/22/2023 1359   MCV 88 04/18/2014 1115   MCH 27.3 02/15/2024 0548   MCHC 31.2 02/15/2024 0548   RDW 13.6 02/15/2024 0548   RDW 14.2 12/22/2023 1359   RDW 13.5 04/18/2014 1115   LYMPHSABS 1.3 12/22/2023 1359   LYMPHSABS 1.9 05/20/2012 0455   MONOABS 0.8 02/02/2023 1834   MONOABS 0.5 05/20/2012 0455   EOSABS 0.1 12/22/2023 1359   EOSABS 0.2 05/20/2012 0455   BASOSABS 0.1 12/22/2023 1359   BASOSABS 0.0 05/20/2012 0455      Latest Ref Rng & Units 02/15/2024    5:48 AM 02/14/2024    4:30 AM 02/13/2024   10:23 AM  BMP  Glucose 70 - 99 mg/dL 773  804  874   BUN 8 - 23 mg/dL 29  20  12    Creatinine 0.44 - 1.00 mg/dL 8.71  9.06  9.03   Sodium 135 - 145 mmol/L 138  137  138   Potassium 3.5 - 5.1 mmol/L 4.4  3.5  3.7   Chloride 98 - 111 mmol/L 103  102  103   CO2 22 - 32 mmol/L 26  25  24    Calcium  8.9 - 10.3 mg/dL 8.5  8.5  8.4    DG Chest 2 View Result Date: 02/13/2024 EXAM: 2 VIEW(S) XRAY OF THE CHEST 02/13/2024 10:42:00 AM COMPARISON: 01/22/2024 CLINICAL HISTORY: SOB FINDINGS: LUNGS AND PLEURA: No focal pulmonary opacity. No pulmonary edema. No pleural effusion. No pneumothorax. HEART AND MEDIASTINUM: Mild cardiomegaly, stable. Calcified aorta. BONES AND SOFT TISSUES: Thoracic degenerative changes. IMPRESSION: 1. No acute cardiopulmonary process. 2. Mild cardiomegaly, stable. Electronically signed by: Norleen Boxer MD 02/13/2024 12:06 PM EST RP Workstation: [id number]   Disposition Plan & Communication  Patient status: Inpatient  Admitted From: Home Planned disposition location: Home Anticipated discharge date:  11/6 pending oxygen status  Family Communication: none at bedside    Author: Marien LITTIE Piety, DO [facility] 02/15/2024, 7:19 AM   Available by Epic secure chat 7AM-7PM. If 7PM-7AM, please contact night-coverage.  [facility] contact information found on [email or web address].

## 2024-02-15 NOTE — Plan of Care (Signed)
   Problem: Activity: Goal: Risk for activity intolerance will decrease Outcome: Progressing   Problem: Nutrition: Goal: Adequate nutrition will be maintained Outcome: Progressing

## 2024-02-15 NOTE — Discharge Instructions (Addendum)
 Please take your albuterol  inhaler as needed if you have wheezing or shortness of breath and take your spiriva daily to help prevent COPD flare ups.   Continue to do as many activities for yourself that you can because staying active is very good for your mind and body. You can take rests as you need them.   Please follow up with your primary doctor in 1-2 weeks for a hospital follow up appointment

## 2024-02-15 NOTE — Progress Notes (Signed)
 SaO2 on 1 liters of O2 at rest = 99% SaO2 on room air at rest = 99% SaO2 on room air while ambulating = 97%   No need for supplemental 02 at this time, pt ambulated out into hallway and back with sp02 lowest reading of 97% on RA.  Tiago Humphrey, OTR/L  02/15/24, 10:30 AM

## 2024-02-15 NOTE — Progress Notes (Signed)
 Occupational Therapy Treatment Patient Details Name: Patricia Walker MRN: [id number] DOB: [birth year] Today's Date: 02/15/2024   History of present illness Pt is a 78 y.o. female presented with worsening of cough, wheezing, shortness of breath. MD assessment includes Acute hypoxic respiratory failure and acute COPD exacerbation. PMH significant for asthma, DOE, CAD, depression, HTN, MI, PAF, morbid obesity, & AFIB   OT comments  Pt is seated in recliner on arrival. Pleasant and agreeable to OT session. She denies pain. Pt performed STS with MOD I and ambulated 20 ft and additional 80 ft using SPC on RA with sp02 >90% throughout. Pt requires hterapeutic rest breaks between ambulation bouts and ADL performance. Standing ADLs performed with supervision/MOD I, however pt notably fatigued with stable vitals. Re-edu on ECS, PLB, rest breaks, pacing and task modification to maximize ease and prevent overexertion with ADLs/mobility. She verbalized understanding and is anxious to return home. Pt returned to bed with all needs in place and will cont to require skilled acute OT services to maximize her safety and IND to return to PLOF.       If plan is discharge home, recommend the following:  A little help with walking and/or transfers;A little help with bathing/dressing/bathroom   Equipment Recommendations  None recommended by OT    Recommendations for Other Services      Precautions / Restrictions Precautions Precautions: Fall Recall of Precautions/Restrictions: Intact Restrictions Weight Bearing Restrictions Per Provider Order: No       Mobility Bed Mobility Overal bed mobility: Modified Independent             General bed mobility comments: to return to supine    Transfers Overall transfer level: Modified independent Equipment used: Straight cane Transfers: Sit to/from Stand Sit to Stand: Modified independent (Device/Increase time)           General transfer comment:  ambulated 20 ft in room on RA with sp02 97%, additional bout of ~80 ft using SPC with sp02 of 97%     Balance Overall balance assessment: Needs assistance Sitting-balance support: Feet supported Sitting balance-Leahy Scale: Normal     Standing balance support: Single extremity supported Standing balance-Leahy Scale: Good                             ADL either performed or assessed with clinical judgement   ADL Overall ADL's : Needs assistance/impaired     Grooming: Wash/dry face;Oral care;Standing;Supervision/safety;Modified independent                               Functional mobility during ADLs: Supervision/safety;Cane General ADL Comments: ambulated 20 ft in room on RA with sp02 97%, additional bout of ~80 ft using SPC with sp02 of 97%    Extremity/Trunk Assessment              Vision       Perception     Praxis     Communication Communication Communication: No apparent difficulties   Cognition Arousal: Alert Behavior During Therapy: WFL for tasks assessed/performed Cognition: No apparent impairments                               Following commands: Intact        Cueing   Cueing Techniques: Verbal cues  Exercises Other Exercises Other Exercises: Re-edu on ECS, pacing,  therapeutic rest breaks, etc. during ADLs and transfer.    Shoulder Instructions       General Comments lowest sp02 of 94% after standing ADLs and return to supine; lowest sp02 during ambulation on RA 97%    Pertinent Vitals/ Pain       Pain Assessment Pain Assessment: No/denies pain  Home Living                                          Prior Functioning/Environment              Frequency  Min 2X/week        Progress Toward Goals  OT Goals(current goals can now be found in the care plan section)  Progress towards OT goals: Progressing toward goals  Acute Rehab OT Goals Patient Stated Goal: go home OT Goal  Formulation: With patient Time For Goal Achievement: 02/28/24 Potential to Achieve Goals: Good  Plan      Co-evaluation                 AM-PAC OT 6 Clicks Daily Activity     Outcome Measure   Help from another person eating meals?: None Help from another person taking care of personal grooming?: None Help from another person toileting, which includes using toliet, bedpan, or urinal?: None Help from another person bathing (including washing, rinsing, drying)?: A Little Help from another person to put on and taking off regular upper body clothing?: None Help from another person to put on and taking off regular lower body clothing?: A Little 6 Click Score: 22    End of Session Equipment Utilized During Treatment: Oxygen (SPC)  OT Visit Diagnosis: Other abnormalities of gait and mobility (R26.89)   Activity Tolerance Patient tolerated treatment well   Patient Left with call bell/phone within reach;in bed   Nurse Communication Mobility status        Time: 8992-8973 OT Time Calculation (min): 19 min  Charges: OT General Charges $OT Visit: 1 Visit OT Treatments $Self Care/Home Management : 8-22 mins  Samantha Olivera, OTR/L  02/15/24, 10:43 AM   Duwaine FORBES Saupe 02/15/2024, 10:39 AM

## 2024-02-15 NOTE — Discharge Summary (Signed)
 Physician Discharge Summary  Patient: Patricia Walker FMW:[id number] DOB: 1945-04-13   Code Status: Full Code Admit date: 02/13/2024 Discharge date: 02/15/2024 Disposition: Home health, PT and OT PCP: [name], FNP  Recommendations for Outpatient Follow-up:  Follow up with PCP within 1-2 weeks Regarding general hospital follow up and preventative care  Discharge Diagnoses:  Principal Problem:   COPD with acute exacerbation (HCC) Active Problems:   Coronary artery disease   Obstructive sleep apnea syndrome   Chronic heart failure with preserved ejection fraction (HFpEF) (HCC)   Hypertension   Hypothyroidism (acquired)   Acute respiratory failure with hypoxia (HCC)   Gastroesophageal reflux disease   Hyperlipidemia, unspecified  Brief Hospital Course Summary: Patricia Walker is a 78 y.o. female with a PMH significant for Asthma/COPD, CAD, HFpEF, PAF on Eliquis , HTN, esophageal stricture s/p dilatation, hypothyroidism, OSA on CPAP at bedtime, morbid obesity. She was recently admitted 10/14/2 for workup of presyncope and symptomatic bradycardia as well as in 10/2023 for H. influenzae bronchospasm with hypoxia.  She presents to [facility] ED with dyspnea and productive cough that began 3 days ago.    ED Course: temp 36.8 C, BP 130/80, HR 84, RR 22, SpO2 91% on room air. CXR obtained and showed no acute cardiopulmonary process.  Labs notable for BNP 321.5, troponin 18-->19 with no reported chest pain, and respiratory swab negative for COVID flu RSV.  She was given Rocephin , Lasix , DuoNebs, and IV Solu-Medrol .    Patient was admitted to medicine service for further workup and management of COPD exacerbation. She received treatment of antibiotics, steroids, and bronchodilator treatments and had remarkable recovery. At time of discharge she was stable ORA. Her home inhalers were refilled and discharged with [facility] orders as recommended by the PT/OT evaluations.   All other chronic  conditions were treated with home medications.    Discharge Condition: Good, improved Recommended discharge diet: Regular healthy diet  Consultations: None   Procedures/Studies: None   Allergies as of 02/15/2024       Reactions   Codeine Hives, Itching, Rash   Other Itching, Other (See Comments)   States antibiotic in the past caused itching but can not remember name        Medication List     TAKE these medications    acetaminophen  325 MG tablet Commonly known as: TYLENOL  Take 2 tablets (650 mg total) by mouth every 6 (six) hours as needed for mild pain (pain score 1-3) or moderate pain (pain score 4-6).   albuterol  108 (90 Base) MCG/ACT inhaler Commonly known as: VENTOLIN  HFA Inhale 1-2 puffs into the lungs every 4 (four) hours as needed for wheezing or shortness of breath. What changed: See the new instructions.   atorvastatin  40 MG tablet Commonly known as: LIPITOR Take 40 mg by mouth daily.   busPIRone 7.5 MG tablet Commonly known as: BUSPAR Take 7.5 mg by mouth 2 (two) times daily.   dapagliflozin  propanediol 10 MG Tabs tablet Commonly known as: Farxiga  Take 1 tablet (10 mg total) by mouth daily before breakfast.   dextromethorphan -guaiFENesin  30-600 MG 12hr tablet Commonly known as: MUCINEX  DM Take 1 tablet by mouth 2 (two) times daily for 10 days.   Eliquis  5 MG Tabs tablet Generic drug: apixaban  TAKE 1 TABLET BY MOUTH TWICE A DAY   enalapril  2.5 MG tablet Commonly known as: VASOTEC  Take 1 tablet (2.5 mg total) by mouth daily.   esomeprazole 40 MG capsule Commonly known as: NEXIUM TAKE 1 CAPSULE BY  MOUTH EVERY DAY IN THE MORNING   fluticasone  50 MCG/ACT nasal spray Commonly known as: FLONASE  Place 1 spray into both nostrils daily. What changed:  when to take this reasons to take this   furosemide  20 MG tablet Commonly known as: Lasix  Take 1 tablet (20 mg total) by mouth daily.   Gemtesa  75 MG Tabs Generic drug: Vibegron  Take 1 tablet  (75 mg total) by mouth daily.   ipratropium-albuterol  0.5-2.5 (3) MG/3ML Soln Commonly known as: DUONEB Take 3 mLs by nebulization every 6 (six) hours as needed.   meclizine 12.5 MG tablet Commonly known as: ANTIVERT Take 1 tablet (12.5 mg total) by mouth 2 (two) times daily as needed for dizziness.   nystatin  powder Commonly known as: nystatin  Apply 1 Application topically 3 (three) times daily as needed. Irritation under breast and groin area   Spiriva Respimat 2.5 MCG/ACT Aers Generic drug: Tiotropium Bromide Inhale 2 puffs into the lungs daily.   Taltz 80 MG/ML pen Generic drug: ixekizumab Inject 80 mg into the skin every 28 (twenty-eight) days.        Contact information for after-discharge care     Home Medical Care     [facility]) .   Service: Home Health Services Contact information: [address] [place]  [address] [phone]                    Subjective   Pt reports feeling well. Denies SOB or CP. Has no cough remaining except for occasional small dry.   All questions and concerns were addressed at time of discharge.  Objective  Blood pressure 116/62, pulse 63, temperature 97.6 F (36.4 C), resp. rate 16, height 5' 5 (1.651 m), weight (!) 138 kg, SpO2 99%.   General: Pt is alert, awake, not in acute distress Cardiovascular: RRR, S1/S2 +, no rubs, no gallops Respiratory: CTA bilaterally, no wheezing, no rhonchi Abdominal: Soft, NT, ND, bowel sounds + Extremities: no edema, no cyanosis  The results of significant diagnostics from this hospitalization (including imaging, microbiology, ancillary and laboratory) are listed below for reference.   Imaging studies: DG Chest 2 View Result Date: 02/13/2024 EXAM: 2 VIEW(S) XRAY OF THE CHEST 02/13/2024 10:42:00 AM COMPARISON: 01/22/2024 CLINICAL HISTORY: SOB FINDINGS: LUNGS AND PLEURA: No focal pulmonary opacity. No pulmonary edema. No  pleural effusion. No pneumothorax. HEART AND MEDIASTINUM: Mild cardiomegaly, stable. Calcified aorta. BONES AND SOFT TISSUES: Thoracic degenerative changes. IMPRESSION: 1. No acute cardiopulmonary process. 2. Mild cardiomegaly, stable. Electronically signed by: Norleen Boxer MD 02/13/2024 12:06 PM EST RP Workstation: [id number]   MR BRAIN WO CONTRAST Result Date: 01/23/2024 EXAM: MR Brain without Intravenous Contrast. CLINICAL HISTORY: Neuro deficit, acute, stroke suspected. 78 y.o. female with a history of COPD, CAD, A-fib on Eliquis , hypertension, and GERD who presents with dizziness, acute onset this afternoon, described as feeling very lightheaded and weak. She had associated shortness of breath. In addition she started to have numbness and tingling in the left leg, and then in both arms. She denies any weakness in her arms or legs. She denies any headache. She has no fever. She has no vomiting or diarrhea. The patient states that symptoms have somewhat improved over the last several hours while she waited, but she still is not feeling right. TECHNIQUE: Magnetic resonance images of the brain without intravenous contrast in multiple planes. CONTRAST: Without. COMPARISON: None provided. FINDINGS: BRAIN: No restricted diffusion to indicate acute infarction. No intracranial mass  or hemorrhage. No midline shift or extra-axial fluid collection. No cerebellar tonsillar ectopia. The central arterial and venous flow voids are patent. Patchy T2 FLAIR hypertensity involving the periventricular and deep white matter bolus hemispheres, consistent with chronic cervical ischemic disease, mild to moderate in nature. 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 VENTRICLES: No hydrocephalus. ORBITS: Prior bilateral ocular lens replacement. SINUSES AND MASTOIDS: The sinuses and mastoid air cells are clear. BONES: No acute fracture  or focal osseous lesion. IMPRESSION: 1. No acute intracranial abnormality. 2. Mild to moderate chronic microvascular ischemic disease. 3. 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 Electronically signed by: Morene Hoard MD 01/23/2024 02:54 AM EDT RP Workstation: [id number]   CT Head Wo Contrast Result Date: 01/22/2024 CLINICAL DATA:  Neuro deficit, acute, stroke suspected EXAM: CT HEAD WITHOUT CONTRAST TECHNIQUE: Contiguous axial images were obtained from the base of the skull through the vertex without intravenous contrast. RADIATION DOSE REDUCTION: This exam was performed according to the departmental dose-optimization program which includes automated exposure control, adjustment of the mA and/or kV according to patient size and/or use of iterative reconstruction technique. COMPARISON:  02/02/2023 FINDINGS: Brain: There is atrophy and chronic small vessel disease changes. No acute intracranial abnormality. Specifically, no hemorrhage, hydrocephalus, mass lesion, acute infarction, or significant intracranial injury. Vascular: No hyperdense vessel or unexpected calcification. Skull: No acute calvarial abnormality. Sinuses/Orbits: No acute findings Other: None IMPRESSION: Atrophy, chronic microvascular disease. No acute intracranial abnormality. Electronically Signed   By: Franky Crease M.D.   On: 01/22/2024 22:40   DG Chest 2 View Result Date: 01/22/2024 CLINICAL DATA:  Shortness of breath EXAM: CHEST - 2 VIEW COMPARISON:  11/05/2023 FINDINGS: Cardiomegaly. No overt edema, confluent opacities or effusions. No acute bony abnormality. IMPRESSION: Cardiomegaly.  No active disease. Electronically Signed   By: Franky Crease M.D.   On: 01/22/2024 22:02    Labs: Basic Metabolic Panel: Recent Labs  Lab 02/13/24 1023 02/14/24 0430 02/15/24 0548  NA 138 137 138  K 3.7 3.5 4.4  CL 103 102 103  CO2 24  25 26   GLUCOSE 125* 195* 226*  BUN 12 20 29*  CREATININE 0.96 0.93 1.28*  CALCIUM  8.4* 8.5* 8.5*  MG  --  2.1  --    CBC: Recent Labs  Lab 02/13/24 1023 02/14/24 0430 02/15/24 0548  WBC 6.2 6.6 14.1*  HGB 13.7 13.6 13.6  HCT 41.9 41.0 43.6  MCV 84.6 83.7 87.4  PLT 248 231 317   Microbiology: Results for orders placed or performed during the hospital encounter of 02/13/24  Resp panel by RT-PCR (RSV, Flu A&B, Covid) Anterior Nasal Swab     Status: None   Collection Time: 02/13/24 12:54 PM   Specimen: Anterior Nasal Swab  Result Value Ref Range Status   SARS Coronavirus 2 by RT PCR NEGATIVE NEGATIVE Final    Comment: (NOTE) SARS-CoV-2 target nucleic acids are NOT DETECTED.  The SARS-CoV-2 RNA is generally detectable in upper respiratory specimens during the acute phase of infection. The lowest concentration of SARS-CoV-2 viral copies this assay can detect is 138 copies/mL. A negative result does not preclude SARS-Cov-2 infection and should not be used as the sole basis for treatment or other patient management decisions. A negative result Jaggi occur with  improper specimen collection/handling, submission of specimen other than nasopharyngeal swab, presence of viral mutation(s)  within the areas targeted by this assay, and inadequate number of viral copies(<138 copies/mL). A negative result must be combined with clinical observations, patient history, and epidemiological information. The expected result is Negative.  Fact Sheet for Patients:  bloggercourse.com  Fact Sheet for Healthcare Providers:  seriousbroker.it  This test is no t yet approved or cleared by the [place]  FDA and  has been authorized for detection and/or diagnosis of SARS-CoV-2 by FDA under an Emergency Use Authorization (EUA). This EUA will remain  in effect (meaning this test can be used) for the duration of the COVID-19 declaration under Section  564(b)(1) of the Act, 21 U.S.C.section 360bbb-3(b)(1), unless the authorization is terminated  or revoked sooner.       Influenza A by PCR NEGATIVE NEGATIVE Final   Influenza B by PCR NEGATIVE NEGATIVE Final    Comment: (NOTE) The Xpert Xpress SARS-CoV-2/FLU/RSV plus assay is intended as an aid in the diagnosis of influenza from Nasopharyngeal swab specimens and should not be used as a sole basis for treatment. Nasal washings and aspirates are unacceptable for Xpert Xpress SARS-CoV-2/FLU/RSV testing.  Fact Sheet for Patients: bloggercourse.com  Fact Sheet for Healthcare Providers: seriousbroker.it  This test is not yet approved or cleared by the [place]  FDA and has been authorized for detection and/or diagnosis of SARS-CoV-2 by FDA under an Emergency Use Authorization (EUA). This EUA will remain in effect (meaning this test can be used) for the duration of the COVID-19 declaration under Section 564(b)(1) of the Act, 21 U.S.C. section 360bbb-3(b)(1), unless the authorization is terminated or revoked.     Resp Syncytial Virus by PCR NEGATIVE NEGATIVE Final    Comment: (NOTE) Fact Sheet for Patients: bloggercourse.com  Fact Sheet for Healthcare Providers: seriousbroker.it  This test is not yet approved or cleared by the [place]  FDA and has been authorized for detection and/or diagnosis of SARS-CoV-2 by FDA under an Emergency Use Authorization (EUA). This EUA will remain in effect (meaning this test can be used) for the duration of the COVID-19 declaration under Section 564(b)(1) of the Act, 21 U.S.C. section 360bbb-3(b)(1), unless the authorization is terminated or revoked.  Performed at [facility], [address] [place], [place] [address]   Respiratory (~20 pathogens) panel by PCR     Status: Abnormal   Collection Time: 02/13/24  7:49 PM    Specimen: Nasopharyngeal Swab; Respiratory  Result Value Ref Range Status   Adenovirus NOT DETECTED NOT DETECTED Final   Coronavirus 229E NOT DETECTED NOT DETECTED Final    Comment: (NOTE) The Coronavirus on the Respiratory Panel, DOES NOT test for the novel  Coronavirus (2019 nCoV)    Coronavirus HKU1 NOT DETECTED NOT DETECTED Final   Coronavirus NL63 NOT DETECTED NOT DETECTED Final   Coronavirus OC43 NOT DETECTED NOT DETECTED Final   Metapneumovirus NOT DETECTED NOT DETECTED Final   Rhinovirus / Enterovirus DETECTED (A) NOT DETECTED Final   Influenza A NOT DETECTED NOT DETECTED Final   Influenza B NOT DETECTED NOT DETECTED Final   Parainfluenza Virus 1 NOT DETECTED NOT DETECTED Final   Parainfluenza Virus 2 NOT DETECTED NOT DETECTED Final   Parainfluenza Virus 3 NOT DETECTED NOT DETECTED Final   Parainfluenza Virus 4 NOT DETECTED NOT DETECTED Final   Respiratory Syncytial Virus NOT DETECTED NOT DETECTED Final   Bordetella pertussis NOT DETECTED NOT DETECTED Final   Bordetella Parapertussis NOT DETECTED NOT DETECTED Final   Chlamydophila pneumoniae NOT DETECTED NOT DETECTED Final   Mycoplasma pneumoniae NOT  DETECTED NOT DETECTED Final    Comment: Performed at [facility], [address] [place], [place] [address]    Time coordinating discharge: Over 30 minutes  Marien LITTIE Piety, MD  [facility] 02/15/2024, 11:03 AM

## 2024-02-16 ENCOUNTER — Telehealth: Payer: Self-pay

## 2024-02-16 NOTE — Transitions of Care (Post Inpatient/ED Visit) (Signed)
   02/16/2024  Name: Patricia Walker MRN: [id number] DOB: 1945-07-06  Today's TOC FU Call Status: Today's TOC FU Call Status:: Unsuccessful Call (1st Attempt) Unsuccessful Call (1st Attempt) Date: 02/16/24  Attempted to reach the patient regarding the most recent Inpatient/ED visit.  Follow Up Plan: Additional outreach attempts will be made to reach the patient to complete the Transitions of Care (Post Inpatient/ED visit) call.   Medford Balboa, BSN, RN [place]  [facility] - [employer] [facility] RN Care Manager [phone]

## 2024-02-19 ENCOUNTER — Telehealth: Payer: Self-pay

## 2024-02-19 ENCOUNTER — Ambulatory Visit: Admitting: Cardiovascular Disease

## 2024-02-19 NOTE — Transitions of Care (Post Inpatient/ED Visit) (Signed)
 02/19/2024  Name: Patricia Walker MRN: [id number] DOB: Mar 17, 1946  Today's TOC FU Call Status: Today's TOC FU Call Status:: Successful TOC FU Call Completed TOC FU Call Complete Date: 02/19/24 Patient's Name and Date of Birth confirmed.  Transition Care Management Follow-up Telephone Call Date of Discharge: 02/14/24 Discharge Facility: [facility]) Type of Discharge: Inpatient Admission Primary Inpatient Discharge Diagnosis:: COPD Exacerbation How have you been since you were released from the hospital?: Better Any questions or concerns?: No  Items Reviewed: Did you receive and understand the discharge instructions provided?: Yes Medications obtained,verified, and reconciled?: Yes (Medications Reviewed) Any new allergies since your discharge?: No Dietary orders reviewed?: Yes Type of Diet Ordered:: Low Sodium Heart Healthy Do you have support at home?: Yes People in Home [RPT]: grandchild(ren) Name of Support/Comfort Primary Source: Patricia Walker  Medications Reviewed Today: Medications Reviewed Today     Reviewed by Moises Reusing, RN (Case Manager) on 02/19/24 at 1451  Med List Status: <None>   Medication Order Taking? Sig Documenting Provider Last Dose Status Informant  acetaminophen  (TYLENOL ) 325 MG tablet [id number] No Take 2 tablets (650 mg total) by mouth every 6 (six) hours as needed for mild pain (pain score 1-3) or moderate pain (pain score 4-6). Jens Durand, MD Unknown Active Self, Pharmacy Records  albuterol  (VENTOLIN  HFA) [phone] Base) MCG/ACT inhaler [id number]  Inhale 1-2 puffs into the lungs every 4 (four) hours as needed for wheezing or shortness of breath. Lenon Marien CROME, MD  Active   atorvastatin  (LIPITOR) 40 MG tablet [id number] No Take 40 mg by mouth daily. [provider] 02/12/2024 Active   busPIRone (BUSPAR) 7.5 MG tablet [id number] No Take 7.5 mg by mouth 2 (two) times daily. [provider] 02/12/2024  Active Self, Pharmacy Records  dapagliflozin  propanediol (FARXIGA ) 10 MG TABS tablet [id number] No Take 1 tablet (10 mg total) by mouth daily before breakfast. Fernand Denyse LABOR, MD 02/12/2024 Active Self, Pharmacy Records  dextromethorphan -guaiFENesin  (MUCINEX  DM) 30-600 MG 12hr tablet [id number]  Take 1 tablet by mouth 2 (two) times daily for 10 days. Lenon Marien CROME, MD  Active   ELIQUIS  5 MG TABS tablet [id number] No TAKE 1 TABLET BY MOUTH TWICE A DAY [name], FNP 02/12/2024 Bedtime Active Self, Pharmacy Records  enalapril  (VASOTEC ) 2.5 MG tablet [id number] No Take 1 tablet (2.5 mg total) by mouth daily. Fernand Denyse LABOR, MD 02/12/2024 Active Self, Pharmacy Records  esomeprazole (NEXIUM) 40 MG capsule [id number] No TAKE 1 CAPSULE BY MOUTH EVERY DAY IN THE MORNING [name], FNP 02/12/2024 Active Self, Pharmacy Records  fluticasone  (FLONASE ) 50 MCG/ACT nasal spray [id number] No Place 1 spray into both nostrils daily.  Patient taking differently: Place 1 spray into both nostrils daily as needed.   [name], FNP Unknown Active Self, Pharmacy Records  furosemide  (LASIX ) 20 MG tablet [id number] No Take 1 tablet (20 mg total) by mouth daily. Fernand Denyse LABOR, MD 02/12/2024 Active Self, Pharmacy Records  ipratropium-albuterol  (DUONEB) 0.5-2.5 (3) MG/3ML SOLN [id number] No Take 3 mLs by nebulization every 6 (six) hours as needed. [name], FNP Unknown Active Self, Pharmacy Records  meclizine (ANTIVERT) 12.5 MG tablet [id number] No Take 1 tablet (12.5 mg total) by mouth 2 (two) times daily as needed for dizziness. Cox, Amy N, DO Unknown Active Self, Pharmacy Records  nystatin  powder [id number] No Apply 1 Application topically 3 (three) times daily as needed. Irritation under breast and groin area [name], FNP Unknown Active  Self, Pharmacy Records  TALTZ 80 MG/ML SOAJ [id number] No Inject 80 mg into the skin every 28 (twenty-eight) days. [provider] 01/17/2024 Active  Self, Pharmacy Records  Tiotropium Bromide (SPIRIVA RESPIMAT) 2.5 MCG/ACT AERS [id number]  Inhale 2 puffs into the lungs daily. Lenon Marien CROME, MD  Active   Vibegron  (GEMTESA ) 75 MG TABS [id number] No Take 1 tablet (75 mg total) by mouth daily. [name], FNP 02/12/2024 Active Self, Pharmacy Records            Home Care and Equipment/Supplies: Were Home Health Services Ordered?: Yes Name of Home Health Agency:: [facility] Has Agency set up a time to come to your home?: Yes First Home Health Visit Date: 02/18/24 Any new equipment or medical supplies ordered?: No  Functional Questionnaire: Do you need assistance with bathing/showering or dressing?: No Do you need assistance with meal preparation?: No Do you need assistance with eating?: No Do you have difficulty maintaining continence: No Do you need assistance with getting out of bed/getting out of a chair/moving?: No Do you have difficulty managing or taking your medications?: No  Follow up appointments reviewed: PCP Follow-up appointment confirmed?: Yes Date of PCP follow-up appointment?: 02/26/24 Follow-up Provider: Alan Orlean Specialist [facility] Follow-up appointment confirmed?: NA Do you need transportation to your follow-up appointment?: No Do you understand care options if your condition(s) worsen?: Yes-patient verbalized understanding  SDOH Interventions Today    Flowsheet Row Most Recent Value  SDOH Interventions   Food Insecurity Interventions Intervention Not Indicated  Housing Interventions Intervention Not Indicated  Transportation Interventions Intervention Not Indicated  Utilities Interventions Intervention Not Indicated    Goals Addressed             This Visit's Progress    VBCI Transitions of Care (TOC) Care Plan       Problems:  Recent Hospitalization for treatment of COPD Hospital or ED Adm Risk 97%  Goal:  Over the next 30 days, the patient will not experience hospital  readmission  Interventions:   COPD Interventions: Advised patient to track and manage COPD triggers Discussed the importance of adequate rest and management of fatigue with COPD Provided education about and advised patient to utilize infection prevention strategies to reduce risk of respiratory infection Provided instruction about proper use of medications used for management of COPD including inhalers Take Albuterol  108 mcg/act inhaler 1-2 puff every four hours as needed FT Mucus Relief DM 30-600mg  tab two times a day for 10 days - 02/25/24 Impratropium-albuterol  0.5-2.5 mg/3ml every six hours as needed Spiriva Respimate 2.5mcg 2 puffs daily  Patient Self Care Activities:  Attend all scheduled provider appointments Call pharmacy for medication refills 3-7 days in advance of running out of medications Call provider office for new concerns or questions  Notify RN Care Manager of [facility] call rescheduling needs Participate in Transition of Care Program/Attend [facility] scheduled calls Perform all self care activities independently  Take medications as prescribed    Plan:  Telephone follow up appointment with care management team member scheduled for:  Tuesday November 18th at 1:15pm        Medford Balboa, BSN, RN [place]  VBCI - Population Health RN Care Manager [phone]

## 2024-02-19 NOTE — Patient Instructions (Signed)
 Visit Information  Thank you for taking time to visit with me today. Please don't hesitate to contact me if I can be of assistance to you before our next scheduled telephone appointment.  Our next appointment is by telephone on Tuesday November 18th at 1:15pm  Following is a copy of your care plan:   Goals Addressed             This Visit's Progress    VBCI Transitions of Care (TOC) Care Plan       Problems:  Recent Hospitalization for treatment of COPD Hospital or ED Adm Risk 97%  Goal:  Over the next 30 days, the patient will not experience hospital readmission  Interventions:   COPD Interventions: Advised patient to track and manage COPD triggers Discussed the importance of adequate rest and management of fatigue with COPD Provided education about and advised patient to utilize infection prevention strategies to reduce risk of respiratory infection Provided instruction about proper use of medications used for management of COPD including inhalers Take Albuterol  108 mcg/act inhaler 1-2 puff every four hours as needed FT Mucus Relief DM 30-600mg  tab two times a day for 10 days - 02/25/24 Impratropium-albuterol  0.5-2.5 mg/3ml every six hours as needed Spiriva Respimate 2.5mcg 2 puffs daily  Patient Self Care Activities:  Attend all scheduled provider appointments Call pharmacy for medication refills 3-7 days in advance of running out of medications Call provider office for new concerns or questions  Notify RN Care Manager of [facility] call rescheduling needs Participate in Transition of Care Program/Attend [facility] scheduled calls Perform all self care activities independently  Take medications as prescribed    Plan:  Telephone follow up appointment with care management team member scheduled for:  Tuesday November 18th at 1:15pm        Patient verbalizes understanding of instructions and care plan provided today and agrees to view in MyChart. Active MyChart status and patient  understanding of how to access instructions and care plan via MyChart confirmed with patient.     The patient has been provided with contact information for the care management team and has been advised to call with any health related questions or concerns.   Please call the care guide team at [phone] if you need to cancel or reschedule your appointment.   Please call the Suicide and Crisis Lifeline: 988 call the [place]  National Suicide Prevention Lifeline: (978)742-1275 or TTY: 787 233 1883 TTY 2263188700) to talk to a trained counselor if you are experiencing a Mental Health or Behavioral Health Crisis or need someone to talk to.  Medford Balboa, BSN, RN [place]  VBCI - [employer] Health RN Care Manager [phone]

## 2024-02-20 ENCOUNTER — Telehealth: Payer: Self-pay

## 2024-02-20 NOTE — Telephone Encounter (Signed)
 [facility] is requesting verbal orders for pt to get OT and PT. They will do an eval this week for start of care once approved.

## 2024-02-21 ENCOUNTER — Telehealth: Payer: Self-pay

## 2024-02-21 NOTE — Telephone Encounter (Signed)
 Pt called stating she was in the hospital for a few days (does have a follow up scheduled on 11/17) but she asked if you can send in rx abx or rx for her cough? Mentioned prednisone ? She said she had the rhino virus and the hospital didn't send in any abx or anything for her. Please advise

## 2024-02-26 ENCOUNTER — Encounter: Payer: Self-pay | Admitting: Family

## 2024-02-26 ENCOUNTER — Ambulatory Visit (INDEPENDENT_AMBULATORY_CARE_PROVIDER_SITE_OTHER): Admitting: Family

## 2024-02-26 VITALS — BP 108/72 | HR 75 | Ht 65.0 in | Wt 293.0 lb

## 2024-02-26 DIAGNOSIS — I4891 Unspecified atrial fibrillation: Secondary | ICD-10-CM

## 2024-02-26 DIAGNOSIS — Z0130 Encounter for examination of blood pressure without abnormal findings: Secondary | ICD-10-CM

## 2024-02-26 DIAGNOSIS — R001 Bradycardia, unspecified: Secondary | ICD-10-CM | POA: Diagnosis not present

## 2024-02-26 DIAGNOSIS — J441 Chronic obstructive pulmonary disease with (acute) exacerbation: Secondary | ICD-10-CM | POA: Diagnosis not present

## 2024-02-26 DIAGNOSIS — Z131 Encounter for screening for diabetes mellitus: Secondary | ICD-10-CM

## 2024-02-26 MED ORDER — MONTELUKAST SODIUM 10 MG PO TABS: 10.0000 mg | ORAL_TABLET | Freq: Every day | ORAL | 1 refills | Status: AC

## 2024-02-26 MED ORDER — PREDNISONE 20 MG PO TABS: 40.0000 mg | ORAL_TABLET | Freq: Every day | ORAL | 0 refills | Status: DC

## 2024-02-26 MED ORDER — AZITHROMYCIN 250 MG PO TABS: ORAL_TABLET | ORAL | 0 refills | Status: AC

## 2024-02-26 NOTE — Progress Notes (Signed)
 Established Patient Office Visit  Subjective:  Patient ID: Patricia Walker, female    DOB: 11/11/1945  Age: 78 y.o. MRN: [id number]  Chief Complaint  Patient presents with   Hospitalization Follow-up    Patient is here today for follow up after recent hospitalization.  She was admitted with COPD exacerbation, discharged after 2 days.  She did not get any antibiotics or prednisone  upon her discharge from the hospital, but thinks these were supposed to be sent.   She also says that she saw Dr. Fernand not that long ago, and he mentioned referring her to electrophysiology, but she has not heard from them at this point. Asks if we can send that referral for her.   She still feels poorly, says that her dyspnea is still bad and that she feels very tired and run down.    No other concerns at this time.   Past Medical History:  Diagnosis Date   Anxiety    Arthritis    Asthma    CHF (congestive heart failure) (HCC)    COPD exacerbation (HCC) 10/08/2020   Coronary artery disease    mild, nonobstructive   Depression    Dyspnea    on exertion   Dysrhythmia    Atrial Fibrillation   GERD (gastroesophageal reflux disease)    Heart murmur    Hyperlipidemia    Hypertension    Hypothyroidism    MI, old 2016   nonobstructive CAD by Cath   Morbid obesity (HCC)    Persistent atrial fibrillation (HCC)    Pneumonia 06/2018   and RSV   Psoriasis    Sleep apnea    compliant with CPAP   Thyroid  disease     Past Surgical History:  Procedure Laterality Date   ARTERY BIOPSY Right 07/21/2017   Procedure: BIOPSY TEMPORAL ARTERY;  Surgeon: Jama Cordella MATSU, MD;  Location: [facility];  Service: Vascular;  Laterality: Right;   CARDIAC CATHETERIZATION     CARDIOVERSION Right 09/01/2016   Procedure: Cardioversion;  Surgeon: Fernand Denyse LABOR, MD;  Location: [facility];  Service: Cardiovascular;  Laterality: Right;   CARDIOVERSION N/A 09/09/2016   Procedure: Cardioversion;  Surgeon: Fernand Denyse LABOR, MD;   Location: [facility];  Service: Cardiovascular;  Laterality: N/A;   CATARACT EXTRACTION W/PHACO Right 03/26/2019   Procedure: CATARACT EXTRACTION PHACO AND INTRAOCULAR LENS PLACEMENT (IOC) RIGHT 6.45, 00:39.9;  Surgeon: Jaye Fallow, MD;  Location: [facility];  Service: Ophthalmology;  Laterality: Right;   CATARACT EXTRACTION W/PHACO Left 04/16/2019   Procedure: CATARACT EXTRACTION PHACO AND INTRAOCULAR LENS PLACEMENT (IOC) LEFT;   3.14, 00:24.9;  Surgeon: Jaye Fallow, MD;  Location: [facility];  Service: Ophthalmology;  Laterality: Left;  sleep apnea-CPAP   COLONOSCOPY WITH PROPOFOL  N/A 08/28/2019   Procedure: COLONOSCOPY WITH PROPOFOL ;  Surgeon: Toledo, Ladell POUR, MD;  Location: [facility];  Service: Gastroenterology;  Laterality: N/A;   ELECTROPHYSIOLOGIC STUDY N/A 02/01/2016   Procedure: CARDIOVERSION;  Surgeon: Denyse LABOR Fernand, MD;  Location: [facility];  Service: Cardiovascular;  Laterality: N/A;   ESOPHAGEAL DILATION     ESOPHAGOGASTRODUODENOSCOPY (EGD) WITH PROPOFOL  N/A 02/06/2017   Procedure: ESOPHAGOGASTRODUODENOSCOPY (EGD) WITH PROPOFOL ;  Surgeon: Therisa Bi, MD;  Location: [facility];  Service: Gastroenterology;  Laterality: N/A;   ESOPHAGOGASTRODUODENOSCOPY (EGD) WITH PROPOFOL  N/A 08/28/2019   Procedure: ESOPHAGOGASTRODUODENOSCOPY (EGD) WITH PROPOFOL ;  Surgeon: Toledo, Ladell POUR, MD;  Location: [facility];  Service: Gastroenterology;  Laterality: N/A;   EUS N/A 02/16/2017   Procedure: FULL UPPER ENDOSCOPIC  ULTRASOUND (EUS) RADIAL;  Surgeon: Elta Fonda SQUIBB, MD;  Location: [facility];  Service: Gastroenterology;  Laterality: N/A;   LEFT HEART CATH AND CORONARY ANGIOGRAPHY N/A 09/08/2016   Procedure: Left Heart Cath and Coronary Angiography;  Surgeon: Fernand Denyse LABOR, MD;  Location: [facility];  Service: Cardiovascular;  Laterality: N/A;   US  ECHOCARDIOGRAPHY      Social History   Socioeconomic History   Marital status: Divorced     Spouse name: Not on file   Number of children: Not on file   Years of education: Not on file   Highest education level: Not on file  Occupational History   Not on file  Tobacco Use   Smoking status: Former    Current packs/day: 0.00    Types: Cigarettes    Quit date: 02/01/2013    Years since quitting: 11.0   Smokeless tobacco: Never  Vaping Use   Vaping status: Never Used  Substance and Sexual Activity   Alcohol use: No   Drug use: No   Sexual activity: Not on file  Other Topics Concern   Not on file  Social History Narrative   Lives in [place] with multiple family members   unemployed   Social Drivers of Health   Financial Resource Strain: Low Risk  (10/23/2023)   Received from [facility]   Overall Financial Resource Strain (CARDIA)    Difficulty of Paying Living Expenses: Not very hard  Food Insecurity: No Food Insecurity (02/19/2024)   Hunger Vital Sign    Worried About Running Out of Food in the Last Year: Never true    Ran Out of Food in the Last Year: Never true  Transportation Needs: No Transportation Needs (02/19/2024)   PRAPARE - Administrator, Civil Service (Medical): No    Lack of Transportation (Non-Medical): No  Physical Activity: Not on file  Stress: Not on file  Social Connections: Moderately Integrated (02/13/2024)   Social Connection and Isolation Panel    Frequency of Communication with Friends and Family: More than three times a week    Frequency of Social Gatherings with Friends and Family: More than three times a week    Attends Religious Services: 1 to 4 times per year    Active Member of [employer] or Organizations: Yes    Attends Banker Meetings: 1 to 4 times per year    Marital Status: Divorced  Catering Manager Violence: Not At Risk (02/19/2024)   Humiliation, Afraid, Rape, and Kick questionnaire    Fear of Current or Ex-Partner: No    Emotionally Abused: No    Physically Abused: No    Sexually  Abused: No    Family History  Problem Relation Age of Onset   Breast cancer Paternal Aunt    Other Father    Heart attack Father     Allergies  Allergen Reactions   Codeine Hives, Itching and Rash   Other Itching and Other (See Comments)    States antibiotic in the past caused itching but can not remember name    Review of Systems  Constitutional:  Positive for malaise/fatigue.  Respiratory:  Positive for shortness of breath and wheezing.   All other systems reviewed and are negative.      Objective:   BP 108/72   Pulse 75   Ht 5' 5 (1.651 m)   Wt 293 lb (132.9 kg)   SpO2 99%   BMI 48.76 kg/m   Vitals:  02/26/24 1322  BP: 108/72  Pulse: 75  Height: 5' 5 (1.651 m)  Weight: 293 lb (132.9 kg)  SpO2: 99%  BMI (Calculated): 48.76    Physical Exam Vitals and nursing note reviewed.  Constitutional:      Appearance: Normal appearance. She is normal weight.  HENT:     Head: Normocephalic.  Eyes:     Extraocular Movements: Extraocular movements intact.     Conjunctiva/sclera: Conjunctivae normal.     Pupils: Pupils are equal, round, and reactive to light.  Cardiovascular:     Rate and Rhythm: Normal rate.  Pulmonary:     Effort: Pulmonary effort is normal.  Neurological:     General: No focal deficit present.     Mental Status: She is alert and oriented to person, place, and time. Mental status is at baseline.  Psychiatric:        Mood and Affect: Mood normal.        Behavior: Behavior normal.        Thought Content: Thought content normal.      No results found for any visits on 02/26/24.  Recent Results (from the past 2160 hours)  CMP14+EGFR     Status: Abnormal   Collection Time: 12/22/23  1:59 PM  Result Value Ref Range   Glucose 88 70 - 99 mg/dL   BUN 14 8 - 27 mg/dL   Creatinine, Ser 8.93 (H) 0.57 - 1.00 mg/dL   eGFR 54 (L) >40 fO/fpw/8.26   BUN/Creatinine Ratio 13 12 - 28   Sodium 141 134 - 144 mmol/L   Potassium 4.0 3.5 - 5.2 mmol/L    Chloride 100 96 - 106 mmol/L   CO2 25 20 - 29 mmol/L   Calcium  9.0 8.7 - 10.3 mg/dL   Total Protein 6.4 6.0 - 8.5 g/dL   Albumin 4.1 3.8 - 4.8 g/dL   Globulin, Total 2.3 1.5 - 4.5 g/dL   Bilirubin Total 0.9 0.0 - 1.2 mg/dL   Alkaline Phosphatase 87 44 - 121 IU/L    Comment: **Effective December 25, 2023 Alkaline Phosphatase**   reference interval will be changing to:              Age                Female          Female           0 -  5 days         47 - 127       47 - 127           6 - 10 days         29 - 242       29 - 242          11 - 20 days        109 - 357      109 - 357          21 - 30 days         94 - 494       94 - 494           1 -  2 months      149 - 539      149 - 539           3 -  6 months      131 - 452      131 -  452           7 - 11 months      117 - 401      117 - 401   12 months -  6 years       158 - 369      158 - 369           7 - 12 years       150 - 409      150 - 409               13 years       156 - 435       78 - 227               14 years       114 - 375       64 - 161               15 years        88 - 279       56 - 134               16 years        74 - 207       51 - 121               17 years        63 - 161       47 - 113          18 - 20 years        51 - 125       42 - 106          21 - 50 years         47 - 123       41 - 116          51 - 80 years        49 - 135       51 - 125              >80 years        48 - 129       48 - 129    AST 21 0 - 40 IU/L   ALT 13 0 - 32 IU/L  Lipid panel     Status: Abnormal   Collection Time: 12/22/23  1:59 PM  Result Value Ref Range   Cholesterol, Total 209 (H) 100 - 199 mg/dL   Triglycerides 881 0 - 149 mg/dL   HDL 39 (L) >60 mg/dL   VLDL Cholesterol Cal 21 5 - 40 mg/dL   LDL Chol Calc (NIH) 850 (H) 0 - 99 mg/dL   Chol/HDL Ratio 5.4 (H) 0.0 - 4.4 ratio    Comment:                                   T. Chol/HDL Ratio                                             Men  Women  1/2 Avg.Risk  3.4    3.3                                   Avg.Risk  5.0    4.4                                2X Avg.Risk  9.6    7.1                                3X Avg.Risk 23.4   11.0   Iron, TIBC and Ferritin Panel     Status: None   Collection Time: 12/22/23  1:59 PM  Result Value Ref Range   Total Iron Binding Capacity 336 250 - 450 ug/dL   UIBC 716 881 - 630 ug/dL   Iron 53 27 - 860 ug/dL   Iron Saturation 16 15 - 55 %   Ferritin 93 15 - 150 ng/mL  VITAMIN D  25 Hydroxy (Vit-D Deficiency, Fractures)     Status: Abnormal   Collection Time: 12/22/23  1:59 PM  Result Value Ref Range   Vit D, 25-Hydroxy 18.5 (L) 30.0 - 100.0 ng/mL    Comment: Vitamin D  deficiency has been defined by the Institute of Medicine and an Endocrine Society practice guideline as a level of serum 25-OH vitamin D  less than 20 ng/mL (1,2). The Endocrine Society went on to further define vitamin D  insufficiency as a level between 21 and 29 ng/mL (2). 1. IOM (Institute of Medicine). 2010. Dietary reference    intakes for calcium  and D. [place]: The    [employer]. 2. Holick MF, Binkley [place], Bischoff-Ferrari HA, et al.    Evaluation, treatment, and prevention of vitamin D     deficiency: an Endocrine Society clinical practice    guideline. JCEM. 2011 Jul; 96(7):1911-30.   Vitamin B12     Status: None   Collection Time: 12/22/23  1:59 PM  Result Value Ref Range   Vitamin B-12 522 232 - 1,245 pg/mL  CBC with Diff     Status: None   Collection Time: 12/22/23  1:59 PM  Result Value Ref Range   WBC 5.7 3.4 - 10.8 x10E3/uL   RBC 4.95 3.77 - 5.28 x10E6/uL   Hemoglobin 14.1 11.1 - 15.9 g/dL   Hematocrit 56.9 65.9 - 46.6 %   MCV 87 79 - 97 fL   MCH 28.5 26.6 - 33.0 pg   MCHC 32.8 31.5 - 35.7 g/dL   RDW 85.7 88.2 - 84.5 %   Platelets 298 150 - 450 x10E3/uL   Neutrophils 65 Not Estab. %   Lymphs 22 Not Estab. %   Monocytes 9 Not Estab. %   Eos 2 Not Estab. %   Basos 1 Not Estab. %    Neutrophils Absolute 3.7 1.4 - 7.0 x10E3/uL   Lymphocytes Absolute 1.3 0.7 - 3.1 x10E3/uL   Monocytes Absolute 0.5 0.1 - 0.9 x10E3/uL   EOS (ABSOLUTE) 0.1 0.0 - 0.4 x10E3/uL   Basophils Absolute 0.1 0.0 - 0.2 x10E3/uL   Immature Granulocytes 0 Not Estab. %   Immature Grans (Abs) 0.0 0.0 - 0.1 x10E3/uL  Hemoglobin A1c     Status: Abnormal   Collection Time: 12/22/23  1:59 PM  Result Value Ref Range   Hgb A1c  MFr Bld 6.0 (H) 4.8 - 5.6 %    Comment:          Prediabetes: 5.7 - 6.4          Diabetes: >6.4          Glycemic control for adults with diabetes: <7.0    Est. average glucose Bld gHb Est-mCnc 126 mg/dL  [id number]     Status: None   Collection Time: 12/22/23  1:59 PM  Result Value Ref Range   TSH 2.070 0.450 - 4.500 uIU/mL   T3, Free 3.0 2.0 - 4.4 pg/mL   Free T4 1.07 0.82 - 1.77 ng/dL  Magnesium      Status: None   Collection Time: 12/22/23  1:59 PM  Result Value Ref Range   Magnesium  1.9 1.6 - 2.3 mg/dL  Specimen status report     Status: None   Collection Time: 12/22/23  1:59 PM  Result Value Ref Range   specimen status report Comment     Comment: Written Authorization Written Authorization Written Authorization Received. Authorization received from Rosina Mae for [employer] on 12-26-2023 Logged by Joesph Ion   Urinalysis, Routine w reflex microscopic -Urine, Clean Catch     Status: Abnormal   Collection Time: 01/22/24  5:30 PM  Result Value Ref Range   Color, Urine YELLOW (A) YELLOW   APPearance CLEAR (A) CLEAR   Specific Gravity, Urine 1.016 1.005 - 1.030   pH 5.0 5.0 - 8.0   Glucose, UA NEGATIVE NEGATIVE mg/dL   Hgb urine dipstick NEGATIVE NEGATIVE   Bilirubin Urine NEGATIVE NEGATIVE   Ketones, ur NEGATIVE NEGATIVE mg/dL   Protein, ur NEGATIVE NEGATIVE mg/dL   Nitrite NEGATIVE NEGATIVE   Leukocytes,Ua NEGATIVE NEGATIVE    Comment: Performed at [facility], [address] [place], [place] [address]  Comprehensive metabolic panel      Status: Abnormal   Collection Time: 01/22/24  5:31 PM  Result Value Ref Range   Sodium 141 135 - 145 mmol/L   Potassium 3.8 3.5 - 5.1 mmol/L   Chloride 103 98 - 111 mmol/L   CO2 26 22 - 32 mmol/L   Glucose, Bld 76 70 - 99 mg/dL    Comment: Glucose reference range applies only to samples taken after fasting for at least 8 hours.   BUN 20 8 - 23 mg/dL   Creatinine, Ser 8.84 (H) 0.44 - 1.00 mg/dL   Calcium  8.5 (L) 8.9 - 10.3 mg/dL   Total Protein 6.6 6.5 - 8.1 g/dL   Albumin 3.3 (L) 3.5 - 5.0 g/dL   AST 18 15 - 41 U/L   ALT 13 0 - 44 U/L   Alkaline Phosphatase 60 38 - 126 U/L   Total Bilirubin 0.9 0.0 - 1.2 mg/dL   GFR, Estimated 49 (L) >60 mL/min    Comment: (NOTE) Calculated using the CKD-EPI Creatinine Equation (2021)    Anion gap 12 5 - 15    Comment: Performed at [facility], [address] [place], [place] [address]  CBC     Status: None   Collection Time: 01/22/24  5:31 PM  Result Value Ref Range   WBC 7.5 4.0 - 10.5 K/uL   RBC 5.09 3.87 - 5.11 MIL/uL   Hemoglobin 14.2 12.0 - 15.0 g/dL   HCT 56.7 63.9 - 53.9 %   MCV 84.9 80.0 - 100.0 fL   MCH 27.9 26.0 - 34.0 pg   MCHC 32.9 30.0 - 36.0 g/dL   RDW  13.6 11.5 - 15.5 %   Platelets 304 150 - 400 K/uL   nRBC 0.0 0.0 - 0.2 %    Comment: Performed at [facility], [address] [place], [place] [address]  Brain natriuretic peptide     Status: Abnormal   Collection Time: 01/22/24  5:31 PM  Result Value Ref Range   B Natriuretic Peptide 144.7 (H) 0.0 - 100.0 pg/mL    Comment: Performed at [facility], [address] [place], [place] [address]  Troponin I (High Sensitivity)     Status: None   Collection Time: 01/22/24  5:31 PM  Result Value Ref Range   Troponin I (High Sensitivity) 11 <18 ng/L    Comment: (NOTE) Elevated high sensitivity troponin I (hsTnI) values and significant  changes across serial measurements [place] suggest ACS but many other  chronic and acute conditions are known  to elevate hsTnI results.  Refer to the Links section for chest pain algorithms and additional  guidance. Performed at [facility], [address] [place], [place] [address]   Resp panel by RT-PCR (RSV, Flu A&B, Covid) Anterior Nasal Swab     Status: None   Collection Time: 01/22/24 11:57 PM   Specimen: Anterior Nasal Swab  Result Value Ref Range   SARS Coronavirus 2 by RT PCR NEGATIVE NEGATIVE    Comment: (NOTE) SARS-CoV-2 target nucleic acids are NOT DETECTED.  The SARS-CoV-2 RNA is generally detectable in upper respiratory specimens during the acute phase of infection. The lowest concentration of SARS-CoV-2 viral copies this assay can detect is 138 copies/mL. A negative result does not preclude SARS-Cov-2 infection and should not be used as the sole basis for treatment or other patient management decisions. A negative result Bierlein occur with  improper specimen collection/handling, submission of specimen other than nasopharyngeal swab, presence of viral mutation(s) within the areas targeted by this assay, and inadequate number of viral copies(<138 copies/mL). A negative result must be combined with clinical observations, patient history, and epidemiological information. The expected result is Negative.  Fact Sheet for Patients:  bloggercourse.com  Fact Sheet for Healthcare Providers:  seriousbroker.it  This test is no t yet approved or cleared by the [place]  FDA and  has been authorized for detection and/or diagnosis of SARS-CoV-2 by FDA under an Emergency Use Authorization (EUA). This EUA will remain  in effect (meaning this test can be used) for the duration of the COVID-19 declaration under Section 564(b)(1) of the Act, 21 U.S.C.section 360bbb-3(b)(1), unless the authorization is terminated  or revoked sooner.       Influenza A by PCR NEGATIVE NEGATIVE   Influenza B by PCR NEGATIVE NEGATIVE     Comment: (NOTE) The Xpert Xpress SARS-CoV-2/FLU/RSV plus assay is intended as an aid in the diagnosis of influenza from Nasopharyngeal swab specimens and should not be used as a sole basis for treatment. Nasal washings and aspirates are unacceptable for Xpert Xpress SARS-CoV-2/FLU/RSV testing.  Fact Sheet for Patients: bloggercourse.com  Fact Sheet for Healthcare Providers: seriousbroker.it  This test is not yet approved or cleared by the [place]  FDA and has been authorized for detection and/or diagnosis of SARS-CoV-2 by FDA under an Emergency Use Authorization (EUA). This EUA will remain in effect (meaning this test can be used) for the duration of the COVID-19 declaration under Section 564(b)(1) of the Act, 21 U.S.C. section 360bbb-3(b)(1), unless the authorization is terminated or revoked.     Resp Syncytial Virus by PCR NEGATIVE NEGATIVE  Comment: (NOTE) Fact Sheet for Patients: bloggercourse.com  Fact Sheet for Healthcare Providers: seriousbroker.it  This test is not yet approved or cleared by the [place]  FDA and has been authorized for detection and/or diagnosis of SARS-CoV-2 by FDA under an Emergency Use Authorization (EUA). This EUA will remain in effect (meaning this test can be used) for the duration of the COVID-19 declaration under Section 564(b)(1) of the Act, 21 U.S.C. section 360bbb-3(b)(1), unless the authorization is terminated or revoked.  Performed at [facility], [address] [place], [place] [address]   Troponin I (High Sensitivity)     Status: None   Collection Time: 01/22/24 11:57 PM  Result Value Ref Range   Troponin I (High Sensitivity) 10 <18 ng/L    Comment: (NOTE) Elevated high sensitivity troponin I (hsTnI) values and significant  changes across serial measurements [place] suggest ACS but many other  chronic and acute  conditions are known to elevate hsTnI results.  Refer to the Links section for chest pain algorithms and additional  guidance. Performed at [facility], [address] [place], [place] [address]   TSH     Status: None   Collection Time: 01/22/24 11:57 PM  Result Value Ref Range   TSH 3.144 0.350 - 4.500 uIU/mL    Comment: Performed by a 3rd Generation assay with a functional sensitivity of <=0.01 uIU/mL. Performed at [facility], [address] [place], [place] [address]   Comprehensive metabolic panel     Status: Abnormal   Collection Time: 01/23/24  5:55 AM  Result Value Ref Range   Sodium 142 135 - 145 mmol/L   Potassium 3.6 3.5 - 5.1 mmol/L   Chloride 105 98 - 111 mmol/L   CO2 26 22 - 32 mmol/L   Glucose, Bld 107 (H) 70 - 99 mg/dL    Comment: Glucose reference range applies only to samples taken after fasting for at least 8 hours.   BUN 22 8 - 23 mg/dL   Creatinine, Ser 8.86 (H) 0.44 - 1.00 mg/dL   Calcium  8.7 (L) 8.9 - 10.3 mg/dL   Total Protein 6.6 6.5 - 8.1 g/dL   Albumin 3.6 3.5 - 5.0 g/dL   AST 19 15 - 41 U/L   ALT 13 0 - 44 U/L   Alkaline Phosphatase 65 38 - 126 U/L   Total Bilirubin 0.5 0.0 - 1.2 mg/dL   GFR, Estimated 50 (L) >60 mL/min    Comment: (NOTE) Calculated using the CKD-EPI Creatinine Equation (2021)    Anion gap 11 5 - 15    Comment: Performed at [facility], [address] [place], [place] [address]  CBC     Status: None   Collection Time: 01/23/24  5:55 AM  Result Value Ref Range   WBC 7.5 4.0 - 10.5 K/uL   RBC 5.01 3.87 - 5.11 MIL/uL   Hemoglobin 13.9 12.0 - 15.0 g/dL   HCT 57.2 63.9 - 53.9 %   MCV 85.2 80.0 - 100.0 fL   MCH 27.7 26.0 - 34.0 pg   MCHC 32.6 30.0 - 36.0 g/dL   RDW 86.1 88.4 - 84.4 %   Platelets 279 150 - 400 K/uL   nRBC 0.0 0.0 - 0.2 %    Comment: Performed at [facility], [address], [place], [place] [address]  CBG monitoring, ED     Status: Abnormal   Collection Time:  01/23/24 11:51 AM  Result Value Ref Range   Glucose-Capillary  114 (H) 70 - 99 mg/dL    Comment: Glucose reference range applies only to samples taken after fasting for at least 8 hours.   Comment 1 Notify RN    Comment 2 Document in Chart    Comment 3 Call MD NNP PA CNM   Comprehensive metabolic panel     Status: Abnormal   Collection Time: 02/08/24 10:24 AM  Result Value Ref Range   Glucose 119 (H) 70 - 99 mg/dL   BUN 11 8 - 27 mg/dL   Creatinine, Ser 8.96 (H) 0.57 - 1.00 mg/dL   eGFR 56 (L) >40 fO/fpw/8.26   BUN/Creatinine Ratio 11 (L) 12 - 28   Sodium 142 134 - 144 mmol/L   Potassium 4.2 3.5 - 5.2 mmol/L   Chloride 104 96 - 106 mmol/L   CO2 25 20 - 29 mmol/L   Calcium  9.0 8.7 - 10.3 mg/dL   Total Protein 5.9 (L) 6.0 - 8.5 g/dL   Albumin 3.7 (L) 3.8 - 4.8 g/dL   Globulin, Total 2.2 1.5 - 4.5 g/dL   Bilirubin Total 0.7 0.0 - 1.2 mg/dL   Alkaline Phosphatase 83 49 - 135 IU/L   AST 19 0 - 40 IU/L   ALT 8 0 - 32 IU/L  Basic metabolic panel     Status: Abnormal   Collection Time: 02/13/24 10:23 AM  Result Value Ref Range   Sodium 138 135 - 145 mmol/L   Potassium 3.7 3.5 - 5.1 mmol/L   Chloride 103 98 - 111 mmol/L   CO2 24 22 - 32 mmol/L   Glucose, Bld 125 (H) 70 - 99 mg/dL    Comment: Glucose reference range applies only to samples taken after fasting for at least 8 hours.   BUN 12 8 - 23 mg/dL   Creatinine, Ser 9.03 0.44 - 1.00 mg/dL   Calcium  8.4 (L) 8.9 - 10.3 mg/dL   GFR, Estimated >39 >39 mL/min    Comment: (NOTE) Calculated using the CKD-EPI Creatinine Equation (2021)    Anion gap 11 5 - 15    Comment: Performed at [facility], [address] [place], [place] [address]  CBC     Status: None   Collection Time: 02/13/24 10:23 AM  Result Value Ref Range   WBC 6.2 4.0 - 10.5 K/uL   RBC 4.95 3.87 - 5.11 MIL/uL   Hemoglobin 13.7 12.0 - 15.0 g/dL   HCT 58.0 63.9 - 53.9 %   MCV 84.6 80.0 - 100.0 fL   MCH 27.7 26.0 - 34.0 pg   MCHC 32.7 30.0 - 36.0 g/dL    RDW 86.3 88.4 - 84.4 %   Platelets 248 150 - 400 K/uL   nRBC 0.0 0.0 - 0.2 %    Comment: Performed at [facility], [address] [place], [place] [address]  BNP (Order if Patient has history of Heart Failure)     Status: Abnormal   Collection Time: 02/13/24 10:23 AM  Result Value Ref Range   B Natriuretic Peptide 321.5 (H) 0.0 - 100.0 pg/mL    Comment: Performed at [facility], [address] [place], [place] [address]  Troponin I (High Sensitivity)     Status: Abnormal   Collection Time: 02/13/24 10:23 AM  Result Value Ref Range   Troponin I (High Sensitivity) 18 (H) <18 ng/L    Comment: (NOTE) Elevated high sensitivity troponin I (hsTnI) values and significant  changes across serial measurements [place] suggest ACS but many other  chronic and acute conditions are known to elevate hsTnI results.  Refer to the Links section for chest pain algorithms and additional  guidance. Performed at [facility], [address] [place], [place] [address]   Resp panel by RT-PCR (RSV, Flu A&B, Covid) Anterior Nasal Swab     Status: None   Collection Time: 02/13/24 12:54 PM   Specimen: Anterior Nasal Swab  Result Value Ref Range   SARS Coronavirus 2 by RT PCR NEGATIVE NEGATIVE    Comment: (NOTE) SARS-CoV-2 target nucleic acids are NOT DETECTED.  The SARS-CoV-2 RNA is generally detectable in upper respiratory specimens during the acute phase of infection. The lowest concentration of SARS-CoV-2 viral copies this assay can detect is 138 copies/mL. A negative result does not preclude SARS-Cov-2 infection and should not be used as the sole basis for treatment or other patient management decisions. A negative result Candy occur with  improper specimen collection/handling, submission of specimen other than nasopharyngeal swab, presence of viral mutation(s) within the areas targeted by this assay, and inadequate number of viral copies(<138 copies/mL). A negative result  must be combined with clinical observations, patient history, and epidemiological information. The expected result is Negative.  Fact Sheet for Patients:  bloggercourse.com  Fact Sheet for Healthcare Providers:  seriousbroker.it  This test is no t yet approved or cleared by the [place]  FDA and  has been authorized for detection and/or diagnosis of SARS-CoV-2 by FDA under an Emergency Use Authorization (EUA). This EUA will remain  in effect (meaning this test can be used) for the duration of the COVID-19 declaration under Section 564(b)(1) of the Act, 21 U.S.C.section 360bbb-3(b)(1), unless the authorization is terminated  or revoked sooner.       Influenza A by PCR NEGATIVE NEGATIVE   Influenza B by PCR NEGATIVE NEGATIVE    Comment: (NOTE) The Xpert Xpress SARS-CoV-2/FLU/RSV plus assay is intended as an aid in the diagnosis of influenza from Nasopharyngeal swab specimens and should not be used as a sole basis for treatment. Nasal washings and aspirates are unacceptable for Xpert Xpress SARS-CoV-2/FLU/RSV testing.  Fact Sheet for Patients: bloggercourse.com  Fact Sheet for Healthcare Providers: seriousbroker.it  This test is not yet approved or cleared by the [place]  FDA and has been authorized for detection and/or diagnosis of SARS-CoV-2 by FDA under an Emergency Use Authorization (EUA). This EUA will remain in effect (meaning this test can be used) for the duration of the COVID-19 declaration under Section 564(b)(1) of the Act, 21 U.S.C. section 360bbb-3(b)(1), unless the authorization is terminated or revoked.     Resp Syncytial Virus by PCR NEGATIVE NEGATIVE    Comment: (NOTE) Fact Sheet for Patients: bloggercourse.com  Fact Sheet for Healthcare Providers: seriousbroker.it  This test is not yet approved or  cleared by the [place]  FDA and has been authorized for detection and/or diagnosis of SARS-CoV-2 by FDA under an Emergency Use Authorization (EUA). This EUA will remain in effect (meaning this test can be used) for the duration of the COVID-19 declaration under Section 564(b)(1) of the Act, 21 U.S.C. section 360bbb-3(b)(1), unless the authorization is terminated or revoked.  Performed at [facility], [address] [place], [place] [address]   Troponin I (High Sensitivity)     Status: Abnormal   Collection Time: 02/13/24 12:54 PM  Result Value Ref Range   Troponin I (High Sensitivity) 19 (H) <18 ng/L    Comment: (NOTE) Elevated high sensitivity troponin I (hsTnI) values and significant  changes  across serial measurements [place] suggest ACS but many other  chronic and acute conditions are known to elevate hsTnI results.  Refer to the Links section for chest pain algorithms and additional  guidance. Performed at [facility], [address] [place], [place] [address]   Respiratory (~20 pathogens) panel by PCR     Status: Abnormal   Collection Time: 02/13/24  7:49 PM   Specimen: Nasopharyngeal Swab; Respiratory  Result Value Ref Range   Adenovirus NOT DETECTED NOT DETECTED   Coronavirus 229E NOT DETECTED NOT DETECTED    Comment: (NOTE) The Coronavirus on the Respiratory Panel, DOES NOT test for the novel  Coronavirus (2019 nCoV)    Coronavirus HKU1 NOT DETECTED NOT DETECTED   Coronavirus NL63 NOT DETECTED NOT DETECTED   Coronavirus OC43 NOT DETECTED NOT DETECTED   Metapneumovirus NOT DETECTED NOT DETECTED   Rhinovirus / Enterovirus DETECTED (A) NOT DETECTED   Influenza A NOT DETECTED NOT DETECTED   Influenza B NOT DETECTED NOT DETECTED   Parainfluenza Virus 1 NOT DETECTED NOT DETECTED   Parainfluenza Virus 2 NOT DETECTED NOT DETECTED   Parainfluenza Virus 3 NOT DETECTED NOT DETECTED   Parainfluenza Virus 4 NOT DETECTED NOT DETECTED   Respiratory  Syncytial Virus NOT DETECTED NOT DETECTED   Bordetella pertussis NOT DETECTED NOT DETECTED   Bordetella Parapertussis NOT DETECTED NOT DETECTED   Chlamydophila pneumoniae NOT DETECTED NOT DETECTED   Mycoplasma pneumoniae NOT DETECTED NOT DETECTED    Comment: Performed at [facility], [address] [place], [place] [address]  HIV Antibody (routine testing w rflx)     Status: None   Collection Time: 02/14/24  4:30 AM  Result Value Ref Range   HIV Screen 4th Generation wRfx Non Reactive Non Reactive    Comment: Performed at [facility], [address] [place], [place] [address]  CBC     Status: None   Collection Time: 02/14/24  4:30 AM  Result Value Ref Range   WBC 6.6 4.0 - 10.5 K/uL   RBC 4.90 3.87 - 5.11 MIL/uL   Hemoglobin 13.6 12.0 - 15.0 g/dL   HCT 58.9 63.9 - 53.9 %   MCV 83.7 80.0 - 100.0 fL   MCH 27.8 26.0 - 34.0 pg   MCHC 33.2 30.0 - 36.0 g/dL   RDW 86.4 88.4 - 84.4 %   Platelets 231 150 - 400 K/uL   nRBC 0.0 0.0 - 0.2 %    Comment: Performed at [facility], [address] [place], [place] [address]  Basic metabolic panel     Status: Abnormal   Collection Time: 02/14/24  4:30 AM  Result Value Ref Range   Sodium 137 135 - 145 mmol/L   Potassium 3.5 3.5 - 5.1 mmol/L   Chloride 102 98 - 111 mmol/L   CO2 25 22 - 32 mmol/L   Glucose, Bld 195 (H) 70 - 99 mg/dL    Comment: Glucose reference range applies only to samples taken after fasting for at least 8 hours.   BUN 20 8 - 23 mg/dL   Creatinine, Ser 9.06 0.44 - 1.00 mg/dL   Calcium  8.5 (L) 8.9 - 10.3 mg/dL   GFR, Estimated >39 >39 mL/min    Comment: (NOTE) Calculated using the CKD-EPI Creatinine Equation (2021)    Anion gap 10 5 - 15    Comment: Performed at [facility], [address] [place], [place] [address]  Magnesium      Status: None  Collection Time: 02/14/24  4:30 AM  Result Value Ref Range   Magnesium  2.1 1.7 - 2.4 mg/dL    Comment: Performed at [facility],  [address] [place], [place] [address]  CBC     Status: Abnormal   Collection Time: 02/15/24  5:48 AM  Result Value Ref Range   WBC 14.1 (H) 4.0 - 10.5 K/uL   RBC 4.99 3.87 - 5.11 MIL/uL   Hemoglobin 13.6 12.0 - 15.0 g/dL   HCT 56.3 63.9 - 53.9 %   MCV 87.4 80.0 - 100.0 fL   MCH 27.3 26.0 - 34.0 pg   MCHC 31.2 30.0 - 36.0 g/dL   RDW 86.3 88.4 - 84.4 %   Platelets 317 150 - 400 K/uL   nRBC 0.0 0.0 - 0.2 %    Comment: Performed at [facility], [address] [place], [place] [address]  Basic metabolic panel with GFR     Status: Abnormal   Collection Time: 02/15/24  5:48 AM  Result Value Ref Range   Sodium 138 135 - 145 mmol/L   Potassium 4.4 3.5 - 5.1 mmol/L   Chloride 103 98 - 111 mmol/L   CO2 26 22 - 32 mmol/L   Glucose, Bld 226 (H) 70 - 99 mg/dL    Comment: Glucose reference range applies only to samples taken after fasting for at least 8 hours.   BUN 29 (H) 8 - 23 mg/dL   Creatinine, Ser 8.71 (H) 0.44 - 1.00 mg/dL   Calcium  8.5 (L) 8.9 - 10.3 mg/dL   GFR, Estimated 43 (L) >60 mL/min    Comment: (NOTE) Calculated using the CKD-EPI Creatinine Equation (2021)    Anion gap 9 5 - 15    Comment: Performed at [facility], [address] [place], [place] [address]       Assessment & Plan:   Assessment & Plan Symptomatic bradycardia Atrial fibrillation with slow ventricular response (HCC) Setting up referral to EP for patient.  Will defer to them for further treatment decisions.   COPD with acute exacerbation (HCC) Sending antibiotics and prednisone  for patient.  Will reassess at follow up in 1 week.    Return in about 1 week (around 03/04/2024).   Total time spent: 30 minutes  ALAN CHRISTELLA ARRANT, FNP  02/26/2024   This document [place] have been prepared by [facility] Voice Recognition software and as such [place] include unintentional dictation errors.

## 2024-02-27 ENCOUNTER — Other Ambulatory Visit: Payer: Self-pay

## 2024-02-27 NOTE — Transitions of Care (Post Inpatient/ED Visit) (Signed)
 Transition of Care week 2  Visit Note  02/27/2024  Name: Patricia Walker MRN: [id number]          DOB: 1945-12-31  Situation: Patient enrolled in [facility] 30-day program. Visit completed with Tilton Brigandi by telephone.   Background:   Initial Transition Care Management Follow-up Telephone Call Discharge Date and Diagnosis: 02/14/24, COPD Exacerbation   Past Medical History:  Diagnosis Date   Anxiety    Arthritis    Asthma    CHF (congestive heart failure) (HCC)    COPD exacerbation (HCC) 10/08/2020   Coronary artery disease    mild, nonobstructive   Depression    Dyspnea    on exertion   Dysrhythmia    Atrial Fibrillation   GERD (gastroesophageal reflux disease)    Heart murmur    Hyperlipidemia    Hypertension    Hypothyroidism    MI, old 2016   nonobstructive CAD by Cath   Morbid obesity (HCC)    Persistent atrial fibrillation (HCC)    Pneumonia 06/2018   and RSV   Psoriasis    Sleep apnea    compliant with CPAP   Thyroid  disease     Assessment: Patient Reported Symptoms: Cognitive Cognitive Status: Alert and oriented to person, place, and time, Normal speech and language skills      Neurological Neurological Review of Symptoms: No symptoms reported    HEENT HEENT Symptoms Reported: No symptoms reported      Cardiovascular Cardiovascular Symptoms Reported: Fatigue Does patient have uncontrolled Hypertension?: No Cardiovascular Management Strategies: Medication therapy  Respiratory Respiratory Symptoms Reported: Productive cough, Wheezing Additional Respiratory Details: The patient does not report much improvement with her Rhinovirus. She went to the provider who prescribed a Z-pack, Prednisone  and Singular. She is still wheezing and fatigued. She has some head congestion Respiratory Management Strategies: Medication therapy, Routine screening, Adequate rest  Endocrine Endocrine Symptoms Reported: No symptoms reported Is patient diabetic?: No     Gastrointestinal Gastrointestinal Symptoms Reported: No symptoms reported      Genitourinary Genitourinary Symptoms Reported: No symptoms reported Genitourinary Management Strategies: Medication therapy  Integumentary Integumentary Symptoms Reported: No symptoms reported    Musculoskeletal Musculoskelatal Symptoms Reviewed: Weakness        Psychosocial Psychosocial Symptoms Reported: No symptoms reported         There were no vitals filed for this visit.    Medications Reviewed Today     Reviewed by Moises Reusing, RN (Case Manager) on 02/27/24 at 1333  Med List Status: <None>   Medication Order Taking? Sig Documenting Provider Last Dose Status Informant  acetaminophen  (TYLENOL ) 325 MG tablet [id number]  Take 2 tablets (650 mg total) by mouth every 6 (six) hours as needed for mild pain (pain score 1-3) or moderate pain (pain score 4-6). Jens Durand, MD  Active Self, Pharmacy Records  albuterol  (VENTOLIN  HFA) 108 (90 Base) MCG/ACT inhaler [id number]  Inhale 1-2 puffs into the lungs every 4 (four) hours as needed for wheezing or shortness of breath. Lenon Marien CROME, MD  Active   atorvastatin  (LIPITOR) 40 MG tablet [id number]  Take 40 mg by mouth daily. [provider]  Active   azithromycin  (ZITHROMAX ) 250 MG tablet [id number]  Take 2 tablets (500 mg total) by mouth daily for 1 day, THEN 1 tablet (250 mg total) daily for 4 days. [name], FNP  Active   busPIRone (BUSPAR) 7.5 MG tablet [id number]  Take 7.5 mg by mouth 2 (two) times daily. [provider]  Active Self, Pharmacy Records  dapagliflozin  propanediol (FARXIGA ) 10 MG TABS tablet [id number]  Take 1 tablet (10 mg total) by mouth daily before breakfast. Fernand Denyse LABOR, MD  Active Self, Pharmacy Records  ELIQUIS  5 MG TABS tablet [id number]  TAKE 1 TABLET BY MOUTH TWICE A DAY [name], FNP  Active Self, Pharmacy Records  enalapril  (VASOTEC ) 2.5 MG tablet [id number]  Take 1 tablet (2.5 mg  total) by mouth daily. Fernand Denyse LABOR, MD  Active Self, Pharmacy Records  esomeprazole (NEXIUM) 40 MG capsule [id number]  TAKE 1 CAPSULE BY MOUTH EVERY DAY IN THE MORNING [name], FNP  Active Self, Pharmacy Records  fluticasone  (FLONASE ) 50 MCG/ACT nasal spray [id number]  Place 1 spray into both nostrils daily.  Patient taking differently: Place 1 spray into both nostrils daily as needed.   [name], FNP  Active Self, Pharmacy Records  furosemide  (LASIX ) 20 MG tablet [id number]  Take 1 tablet (20 mg total) by mouth daily. Fernand Denyse LABOR, MD  Active Self, Pharmacy Records  ipratropium-albuterol  (DUONEB) 0.5-2.5 (3) MG/3ML SOLN [id number]  Take 3 mLs by nebulization every 6 (six) hours as needed. [name], FNP  Active Self, Pharmacy Records  meclizine (ANTIVERT) 12.5 MG tablet [id number]  Take 1 tablet (12.5 mg total) by mouth 2 (two) times daily as needed for dizziness. Cox, Amy N, DO  Active Self, Pharmacy Records  montelukast  (SINGULAIR ) 10 MG tablet [id number]  Take 1 tablet (10 mg total) by mouth at bedtime. [name], FNP  Active   nystatin  powder [id number]  Apply 1 Application topically 3 (three) times daily as needed. Irritation under breast and groin area [name], FNP  Active Self, Pharmacy Records  predniSONE  (DELTASONE ) 20 MG tablet [id number]  Take 2 tablets (40 mg total) by mouth daily with breakfast. [name], FNP  Active   TALTZ 80 MG/ML SOAJ [id number]  Inject 80 mg into the skin every 28 (twenty-eight) days. [provider]  Active Self, Pharmacy Records  Tiotropium Bromide (SPIRIVA RESPIMAT) 2.5 MCG/ACT AERS [id number]  Inhale 2 puffs into the lungs daily. Lenon Marien CROME, MD  Active   Vibegron  (GEMTESA ) 75 MG TABS [id number]  Take 1 tablet (75 mg total) by mouth daily. [name], FNP  Active Self, Pharmacy Records            Recommendation:   Continue Current Plan of Care  Follow Up Plan:   Telephone  follow-up in 1 week  Medford Balboa, BSN, RN [place]  VBCI - [facility] RN Care Manager [phone]

## 2024-02-27 NOTE — Patient Instructions (Signed)
 Visit Information  Thank you for taking time to visit with me today. Please don't hesitate to contact me if I can be of assistance to you before our next scheduled telephone appointment.  Our next appointment is by telephone on Wednesday November 26th at 2:30pm  Following is a copy of your care plan:   Goals Addressed             This Visit's Progress    VBCI Transitions of Care (TOC) Care Plan       Problems: (reviewed 02/27/24) Recent Hospitalization for treatment of COPD Hospital or ED Adm Risk 97%  Goal: (reviewed 02/27/24) Over the next 30 days, the patient will not experience hospital readmission  Interventions: (reviewed 02/27/24)  COPD Interventions:(reviewed 02/27/24) Advised patient to track and manage COPD triggers Discussed the importance of adequate rest and management of fatigue with COPD Provided education about and advised patient to utilize infection prevention strategies to reduce risk of respiratory infection Provided instruction about proper use of medications used for management of COPD including inhalers Take Albuterol  108 mcg/act inhaler 1-2 puff every four hours as needed FT Mucus Relief DM 30-600mg  tab two times a day for 10 days - 02/25/24 Impratropium-albuterol  0.5-2.5 mg/3ml every six hours as needed Spiriva Respimate 2.5mcg 2 puffs daily - 11/18 - Predisone taper, Z-Pack, Singulair  Follow up with PCP in one week - 03/06/24  Patient Self Care Activities:  Attend all scheduled provider appointments Call pharmacy for medication refills 3-7 days in advance of running out of medications Call provider office for new concerns or questions  Notify RN Care Manager of [facility] call rescheduling needs Participate in Transition of Care Program/Attend [facility] scheduled calls Perform all self care activities independently  Take medications as prescribed    Plan:  Telephone follow up appointment with care management team member scheduled for:  Wednesday November 26th  at 2:30pm        Patient verbalizes understanding of instructions and care plan provided today and agrees to view in MyChart. Active MyChart status and patient understanding of how to access instructions and care plan via MyChart confirmed with patient.     The patient has been provided with contact information for the care management team and has been advised to call with any health related questions or concerns.   Please call the care guide team at [phone] if you need to cancel or reschedule your appointment.   Please call the Suicide and Crisis Lifeline: 988 call the [place]  National Suicide Prevention Lifeline: 805-663-3285 or TTY: 671-065-6865 TTY 561 814 2161) to talk to a trained counselor if you are experiencing a Mental Health or Behavioral Health Crisis or need someone to talk to.  Medford Balboa, BSN, RN [place]  VBCI - [employer] [facility] RN Care Manager [phone]

## 2024-03-01 ENCOUNTER — Encounter: Payer: Self-pay | Admitting: Family

## 2024-03-01 ENCOUNTER — Ambulatory Visit (INDEPENDENT_AMBULATORY_CARE_PROVIDER_SITE_OTHER): Admitting: Family

## 2024-03-01 VITALS — BP 112/68 | HR 60 | Ht 65.0 in | Wt 291.4 lb

## 2024-03-01 DIAGNOSIS — E782 Mixed hyperlipidemia: Secondary | ICD-10-CM

## 2024-03-01 DIAGNOSIS — I10 Essential (primary) hypertension: Secondary | ICD-10-CM

## 2024-03-01 DIAGNOSIS — Z6841 Body Mass Index (BMI) 40.0 and over, adult: Secondary | ICD-10-CM | POA: Insufficient documentation

## 2024-03-01 DIAGNOSIS — R300 Dysuria: Secondary | ICD-10-CM | POA: Insufficient documentation

## 2024-03-01 DIAGNOSIS — R5383 Other fatigue: Secondary | ICD-10-CM | POA: Diagnosis not present

## 2024-03-01 DIAGNOSIS — E66813 Obesity, class 3: Secondary | ICD-10-CM

## 2024-03-01 DIAGNOSIS — R7303 Prediabetes: Secondary | ICD-10-CM

## 2024-03-01 LAB — POCT URINALYSIS DIPSTICK
Bilirubin, UA: NEGATIVE
Blood, UA: NEGATIVE
Glucose, UA: POSITIVE — AB
Ketones, UA: NEGATIVE
Leukocytes, UA: NEGATIVE
Nitrite, UA: NEGATIVE
Protein, UA: NEGATIVE
Spec Grav, UA: 1.015 (ref 1.010–1.025)
Urobilinogen, UA: 0.2 U/dL
pH, UA: 5.5 (ref 5.0–8.0)

## 2024-03-01 MED ORDER — NITROFURANTOIN MONOHYD MACRO 100 MG PO CAPS: 100.0000 mg | ORAL_CAPSULE | Freq: Two times a day (BID) | ORAL | 0 refills | Status: AC

## 2024-03-01 NOTE — Assessment & Plan Note (Signed)
 Sending antibiotics and prednisone  for patient.  Will reassess at follow up in 1 week.

## 2024-03-01 NOTE — Assessment & Plan Note (Signed)
-   Continue healthy diet and exercise as tolerated. - Continue medications as prescribed.

## 2024-03-01 NOTE — Assessment & Plan Note (Signed)
 Setting up referral to EP for patient.  Will defer to them for further treatment decisions.

## 2024-03-01 NOTE — Progress Notes (Unsigned)
 Established Patient Office Visit  Subjective:  Patient ID: Patricia Walker, female    DOB: [birth year]  Age: 78 y.o. MRN: [id number]  Chief Complaint  Patient presents with   Follow-up    1 week follow up    Patient is here today for her acute visit.  She has been feeling poorly since last appointment.   She does have additional concerns to discuss today. Reports right flank pain that is worse with sitting. She reports burning with urination. Reports symptoms began 3 days ago. UA was unremarkable but will send for culture and start empiric Macrobid  twice daily. Recommended patient to drink plenty of water.  I have reviewed her active problem list, medication list, allergies for her appointment today.      No other concerns at this time.   Past Medical History:  Diagnosis Date   Anxiety    Arthritis    Asthma    CHF (congestive heart failure) (HCC)    COPD exacerbation (HCC) 10/08/2020   Coronary artery disease    mild, nonobstructive   Depression    Dyspnea    on exertion   Dysrhythmia    Atrial Fibrillation   GERD (gastroesophageal reflux disease)    Heart murmur    Hyperlipidemia    Hypertension    Hypothyroidism    MI, old 2016   nonobstructive CAD by Cath   Morbid obesity (HCC)    Persistent atrial fibrillation (HCC)    Pneumonia 06/2018   and RSV   Psoriasis    Sleep apnea    compliant with CPAP   Thyroid  disease     Past Surgical History:  Procedure Laterality Date   ARTERY BIOPSY Right 07/21/2017   Procedure: BIOPSY TEMPORAL ARTERY;  Surgeon: Jama Cordella MATSU, MD;  Location: [facility];  Service: Vascular;  Laterality: Right;   CARDIAC CATHETERIZATION     CARDIOVERSION Right 09/01/2016   Procedure: Cardioversion;  Surgeon: Fernand Denyse LABOR, MD;  Location: [facility];  Service: Cardiovascular;  Laterality: Right;   CARDIOVERSION N/A 09/09/2016   Procedure: Cardioversion;  Surgeon: Fernand Denyse LABOR, MD;  Location: [facility];  Service: Cardiovascular;   Laterality: N/A;   CATARACT EXTRACTION W/PHACO Right 03/26/2019   Procedure: CATARACT EXTRACTION PHACO AND INTRAOCULAR LENS PLACEMENT (IOC) RIGHT 6.45, 00:39.9;  Surgeon: Jaye Fallow, MD;  Location: [facility];  Service: Ophthalmology;  Laterality: Right;   CATARACT EXTRACTION W/PHACO Left 04/16/2019   Procedure: CATARACT EXTRACTION PHACO AND INTRAOCULAR LENS PLACEMENT (IOC) LEFT;   3.14, 00:24.9;  Surgeon: Jaye Fallow, MD;  Location: [facility];  Service: Ophthalmology;  Laterality: Left;  sleep apnea-CPAP   COLONOSCOPY WITH PROPOFOL  N/A 08/28/2019   Procedure: COLONOSCOPY WITH PROPOFOL ;  Surgeon: Toledo, Ladell POUR, MD;  Location: [facility];  Service: Gastroenterology;  Laterality: N/A;   ELECTROPHYSIOLOGIC STUDY N/A 02/01/2016   Procedure: CARDIOVERSION;  Surgeon: Denyse LABOR Fernand, MD;  Location: [facility];  Service: Cardiovascular;  Laterality: N/A;   ESOPHAGEAL DILATION     ESOPHAGOGASTRODUODENOSCOPY (EGD) WITH PROPOFOL  N/A 02/06/2017   Procedure: ESOPHAGOGASTRODUODENOSCOPY (EGD) WITH PROPOFOL ;  Surgeon: Therisa Bi, MD;  Location: [facility];  Service: Gastroenterology;  Laterality: N/A;   ESOPHAGOGASTRODUODENOSCOPY (EGD) WITH PROPOFOL  N/A 08/28/2019   Procedure: ESOPHAGOGASTRODUODENOSCOPY (EGD) WITH PROPOFOL ;  Surgeon: Toledo, Ladell POUR, MD;  Location: [facility];  Service: Gastroenterology;  Laterality: N/A;   EUS N/A 02/16/2017   Procedure: FULL UPPER ENDOSCOPIC ULTRASOUND (EUS) RADIAL;  Surgeon: Elta Fonda SQUIBB, MD;  Location: [facility];  Service:  Gastroenterology;  Laterality: N/A;   LEFT HEART CATH AND CORONARY ANGIOGRAPHY N/A 09/08/2016   Procedure: Left Heart Cath and Coronary Angiography;  Surgeon: Fernand Denyse LABOR, MD;  Location: [facility];  Service: Cardiovascular;  Laterality: N/A;   US  ECHOCARDIOGRAPHY      Social History   Socioeconomic History   Marital status: Divorced    Spouse name: Not on file   Number of children:  Not on file   Years of education: Not on file   Highest education level: Not on file  Occupational History   Not on file  Tobacco Use   Smoking status: Former    Current packs/day: 0.00    Types: Cigarettes    Quit date: 02/01/2013    Years since quitting: 11.0   Smokeless tobacco: Never  Vaping Use   Vaping status: Never Used  Substance and Sexual Activity   Alcohol use: No   Drug use: No   Sexual activity: Not on file  Other Topics Concern   Not on file  Social History Narrative   Lives in [place] with multiple family members   unemployed   Social Drivers of Health   Financial Resource Strain: Low Risk  (10/23/2023)   Received from [facility]   Overall Financial Resource Strain (CARDIA)    Difficulty of Paying Living Expenses: Not very hard  Food Insecurity: No Food Insecurity (02/19/2024)   Hunger Vital Sign    Worried About Running Out of Food in the Last Year: Never true    Ran Out of Food in the Last Year: Never true  Transportation Needs: No Transportation Needs (02/19/2024)   PRAPARE - Administrator, Civil Service (Medical): No    Lack of Transportation (Non-Medical): No  Physical Activity: Not on file  Stress: Not on file  Social Connections: Moderately Integrated (02/13/2024)   Social Connection and Isolation Panel    Frequency of Communication with Friends and Family: More than three times a week    Frequency of Social Gatherings with Friends and Family: More than three times a week    Attends Religious Services: 1 to 4 times per year    Active Member of [employer] or Organizations: Yes    Attends Banker Meetings: 1 to 4 times per year    Marital Status: Divorced  Catering Manager Violence: Not At Risk (02/19/2024)   Humiliation, Afraid, Rape, and Kick questionnaire    Fear of Current or Ex-Partner: No    Emotionally Abused: No    Physically Abused: No    Sexually Abused: No    Family History  Problem  Relation Age of Onset   Breast cancer Paternal Aunt    Other Father    Heart attack Father     Allergies  Allergen Reactions   Codeine Hives, Itching and Rash   Other Itching and Other (See Comments)    States antibiotic in the past caused itching but can not remember name    Review of Systems  Constitutional:  Positive for malaise/fatigue.  HENT: Negative.    Eyes:  Negative for blurred vision and pain.  Respiratory:  Negative for cough and shortness of breath.   Cardiovascular:  Negative for chest pain, palpitations, claudication and leg swelling.  Gastrointestinal:  Negative for abdominal pain, blood in stool, constipation, diarrhea, nausea and vomiting.  Genitourinary:  Positive for dysuria and flank pain (right sided). Negative for frequency and urgency.  Skin: Negative.   Neurological:  Negative for dizziness, tingling, sensory change and headaches.  Endo/Heme/Allergies: Negative.   Psychiatric/Behavioral: Negative.         Objective:   BP 112/68   Pulse 60   Ht 5' 5 (1.651 m)   Wt 291 lb 6.4 oz (132.2 kg)   SpO2 98%   BMI 48.49 kg/m   Vitals:   03/01/24 1518  BP: 112/68  Pulse: 60  Height: 5' 5 (1.651 m)  Weight: 291 lb 6.4 oz (132.2 kg)  SpO2: 98%  BMI (Calculated): 48.49    Physical Exam Vitals and nursing note reviewed.  Constitutional:      Appearance: Normal appearance.  HENT:     Head: Normocephalic.  Eyes:     Extraocular Movements: Extraocular movements intact.     Pupils: Pupils are equal, round, and reactive to light.  Cardiovascular:     Rate and Rhythm: Normal rate and regular rhythm.     Pulses: Normal pulses.     Heart sounds: Normal heart sounds. No murmur heard. Pulmonary:     Effort: Pulmonary effort is normal. No respiratory distress.     Breath sounds: Normal breath sounds.  Abdominal:     General: There is no distension.     Tenderness: There is no abdominal tenderness. There is right CVA tenderness.  Musculoskeletal:         General: No tenderness. Normal range of motion.     Cervical back: Normal range of motion and neck supple.     Right lower leg: No edema.     Left lower leg: No edema.  Skin:    General: Skin is warm and dry.     Coloration: Skin is not jaundiced.     Findings: No erythema.  Neurological:     General: No focal deficit present.     Mental Status: She is alert and oriented to person, place, and time.  Psychiatric:        Mood and Affect: Mood normal.        Speech: Speech normal.        Behavior: Behavior is cooperative.        Cognition and Memory: Memory is not impaired.      Results for orders placed or performed in visit on 03/01/24  POCT Urinalysis Dipstick (81002)  Result Value Ref Range   Color, UA Yellow    Clarity, UA Clear    Glucose, UA Positive (A) Negative   Bilirubin, UA Negative    Ketones, UA Negative    Spec Grav, UA 1.015 1.010 - 1.025   Blood, UA Negative    pH, UA 5.5 5.0 - 8.0   Protein, UA Negative Negative   Urobilinogen, UA 0.2 0.2 or 1.0 E.U./dL   Nitrite, UA Negative    Leukocytes, UA Negative Negative   Appearance Clear    Odor No     Recent Results (from the past 2160 hours)  CMP14+EGFR     Status: Abnormal   Collection Time: 12/22/23  1:59 PM  Result Value Ref Range   Glucose 88 70 - 99 mg/dL   BUN 14 8 - 27 mg/dL   Creatinine, Ser 8.93 (H) 0.57 - 1.00 mg/dL   eGFR 54 (L) >40 fO/fpw/8.26   BUN/Creatinine Ratio 13 12 - 28   Sodium 141 134 - 144 mmol/L   Potassium 4.0 3.5 - 5.2 mmol/L   Chloride 100 96 - 106 mmol/L   CO2 25 20 - 29 mmol/L   Calcium  9.0 8.7 -  10.3 mg/dL   Total Protein 6.4 6.0 - 8.5 g/dL   Albumin 4.1 3.8 - 4.8 g/dL   Globulin, Total 2.3 1.5 - 4.5 g/dL   Bilirubin Total 0.9 0.0 - 1.2 mg/dL   Alkaline Phosphatase 87 44 - 121 IU/L    Comment: **Effective December 25, 2023 Alkaline Phosphatase**   reference interval will be changing to:              Age                Female          Female           0 -  5 days          47 - 127       47 - 127           6 - 10 days         29 - 242       29 - 242          11 - 20 days        109 - 357      109 - 357          21 - 30 days         94 - 494       94 - 494           1 -  2 months      149 - 539      149 - 539           3 -  6 months      131 - 452      131 - 452           7 - 11 months      117 - 401      117 - 401   12 months -  6 years       158 - 369      158 - 369           7 - 12 years       150 - 409      150 - 409               13 years       156 - 435       78 - 227               14 years       114 - 375       64 - 161               15 years        88 - 279       56 - 134               16 years        74 - 207       51 - 121               17 years        63 - 161       47 - 113          18 - 20 years        51 - 125       42 - 106  21 - 50 years         47 - 123       41 - 116          51 - 80 years        49 - 135       51 - 125              >80 years        48 - 129       48 - 129    AST 21 0 - 40 IU/L   ALT 13 0 - 32 IU/L  Lipid panel     Status: Abnormal   Collection Time: 12/22/23  1:59 PM  Result Value Ref Range   Cholesterol, Total 209 (H) 100 - 199 mg/dL   Triglycerides 881 0 - 149 mg/dL   HDL 39 (L) >60 mg/dL   VLDL Cholesterol Cal 21 5 - 40 mg/dL   LDL Chol Calc (NIH) 850 (H) 0 - 99 mg/dL   Chol/HDL Ratio 5.4 (H) 0.0 - 4.4 ratio    Comment:                                   T. Chol/HDL Ratio                                             Men  Women                               1/2 Avg.Risk  3.4    3.3                                   Avg.Risk  5.0    4.4                                2X Avg.Risk  9.6    7.1                                3X Avg.Risk 23.4   11.0   Iron, TIBC and Ferritin Panel     Status: None   Collection Time: 12/22/23  1:59 PM  Result Value Ref Range   Total Iron Binding Capacity 336 250 - 450 ug/dL   UIBC 716 881 - 630 ug/dL   Iron 53 27 - 860 ug/dL   Iron Saturation 16 15 - 55 %   Ferritin  93 15 - 150 ng/mL  VITAMIN D  25 Hydroxy (Vit-D Deficiency, Fractures)     Status: Abnormal   Collection Time: 12/22/23  1:59 PM  Result Value Ref Range   Vit D, 25-Hydroxy 18.5 (L) 30.0 - 100.0 ng/mL    Comment: Vitamin D  deficiency has been defined by the Institute of Medicine and an Endocrine Society practice guideline as a level of serum 25-OH vitamin D  less than 20 ng/mL (1,2). The Endocrine Society went on to further define vitamin D  insufficiency as a level between 21 and 29 ng/mL (2). 1. IOM (Institute of Medicine). 2010. Dietary reference  intakes for calcium  and D. [place]: The    [employer]. 2. Holick MF, Binkley [place], Bischoff-Ferrari HA, et al.    Evaluation, treatment, and prevention of vitamin D     deficiency: an Endocrine Society clinical practice    guideline. JCEM. 2011 Jul; 96(7):1911-30.   Vitamin B12     Status: None   Collection Time: 12/22/23  1:59 PM  Result Value Ref Range   Vitamin B-12 522 232 - 1,245 pg/mL  CBC with Diff     Status: None   Collection Time: 12/22/23  1:59 PM  Result Value Ref Range   WBC 5.7 3.4 - 10.8 x10E3/uL   RBC 4.95 3.77 - 5.28 x10E6/uL   Hemoglobin 14.1 11.1 - 15.9 g/dL   Hematocrit 56.9 65.9 - 46.6 %   MCV 87 79 - 97 fL   MCH 28.5 26.6 - 33.0 pg   MCHC 32.8 31.5 - 35.7 g/dL   RDW 85.7 88.2 - 84.5 %   Platelets 298 150 - 450 x10E3/uL   Neutrophils 65 Not Estab. %   Lymphs 22 Not Estab. %   Monocytes 9 Not Estab. %   Eos 2 Not Estab. %   Basos 1 Not Estab. %   Neutrophils Absolute 3.7 1.4 - 7.0 x10E3/uL   Lymphocytes Absolute 1.3 0.7 - 3.1 x10E3/uL   Monocytes Absolute 0.5 0.1 - 0.9 x10E3/uL   EOS (ABSOLUTE) 0.1 0.0 - 0.4 x10E3/uL   Basophils Absolute 0.1 0.0 - 0.2 x10E3/uL   Immature Granulocytes 0 Not Estab. %   Immature Grans (Abs) 0.0 0.0 - 0.1 x10E3/uL  Hemoglobin A1c     Status: Abnormal   Collection Time: 12/22/23  1:59 PM  Result Value Ref Range   Hgb A1c MFr Bld 6.0 (H) 4.8 - 5.6 %     Comment:          Prediabetes: 5.7 - 6.4          Diabetes: >6.4          Glycemic control for adults with diabetes: <7.0    Est. average glucose Bld gHb Est-mCnc 126 mg/dL  [id number]     Status: None   Collection Time: 12/22/23  1:59 PM  Result Value Ref Range   TSH 2.070 0.450 - 4.500 uIU/mL   T3, Free 3.0 2.0 - 4.4 pg/mL   Free T4 1.07 0.82 - 1.77 ng/dL  Magnesium      Status: None   Collection Time: 12/22/23  1:59 PM  Result Value Ref Range   Magnesium  1.9 1.6 - 2.3 mg/dL  Specimen status report     Status: None   Collection Time: 12/22/23  1:59 PM  Result Value Ref Range   specimen status report Comment     Comment: Written Authorization Written Authorization Written Authorization Received. Authorization received from Rosina Mae for [employer] on 12-26-2023 Logged by Joesph Ion   Urinalysis, Routine w reflex microscopic -Urine, Clean Catch     Status: Abnormal   Collection Time: 01/22/24  5:30 PM  Result Value Ref Range   Color, Urine YELLOW (A) YELLOW   APPearance CLEAR (A) CLEAR   Specific Gravity, Urine 1.016 1.005 - 1.030   pH 5.0 5.0 - 8.0   Glucose, UA NEGATIVE NEGATIVE mg/dL   Hgb urine dipstick NEGATIVE NEGATIVE   Bilirubin Urine NEGATIVE NEGATIVE   Ketones, ur NEGATIVE NEGATIVE mg/dL   Protein, ur NEGATIVE NEGATIVE mg/dL   Nitrite NEGATIVE NEGATIVE   Leukocytes,Ua NEGATIVE NEGATIVE  Comment: Performed at [facility], [address] [place], [place] [address]  Comprehensive metabolic panel     Status: Abnormal   Collection Time: 01/22/24  5:31 PM  Result Value Ref Range   Sodium 141 135 - 145 mmol/L   Potassium 3.8 3.5 - 5.1 mmol/L   Chloride 103 98 - 111 mmol/L   CO2 26 22 - 32 mmol/L   Glucose, Bld 76 70 - 99 mg/dL    Comment: Glucose reference range applies only to samples taken after fasting for at least 8 hours.   BUN 20 8 - 23 mg/dL   Creatinine, Ser 8.84 (H) 0.44 - 1.00 mg/dL   Calcium  8.5 (L) 8.9 - 10.3 mg/dL   Total  Protein 6.6 6.5 - 8.1 g/dL   Albumin 3.3 (L) 3.5 - 5.0 g/dL   AST 18 15 - 41 U/L   ALT 13 0 - 44 U/L   Alkaline Phosphatase 60 38 - 126 U/L   Total Bilirubin 0.9 0.0 - 1.2 mg/dL   GFR, Estimated 49 (L) >60 mL/min    Comment: (NOTE) Calculated using the CKD-EPI Creatinine Equation (2021)    Anion gap 12 5 - 15    Comment: Performed at [facility], [address] [place], [place] [address]  CBC     Status: None   Collection Time: 01/22/24  5:31 PM  Result Value Ref Range   WBC 7.5 4.0 - 10.5 K/uL   RBC 5.09 3.87 - 5.11 MIL/uL   Hemoglobin 14.2 12.0 - 15.0 g/dL   HCT 56.7 63.9 - 53.9 %   MCV 84.9 80.0 - 100.0 fL   MCH 27.9 26.0 - 34.0 pg   MCHC 32.9 30.0 - 36.0 g/dL   RDW 86.3 88.4 - 84.4 %   Platelets 304 150 - 400 K/uL   nRBC 0.0 0.0 - 0.2 %    Comment: Performed at [facility], [address] [place], [place] [address]  Brain natriuretic peptide     Status: Abnormal   Collection Time: 01/22/24  5:31 PM  Result Value Ref Range   B Natriuretic Peptide 144.7 (H) 0.0 - 100.0 pg/mL    Comment: Performed at [facility], [address] [place], [place] [address]  Troponin I (High Sensitivity)     Status: None   Collection Time: 01/22/24  5:31 PM  Result Value Ref Range   Troponin I (High Sensitivity) 11 <18 ng/L    Comment: (NOTE) Elevated high sensitivity troponin I (hsTnI) values and significant  changes across serial measurements Reffitt suggest ACS but many other  chronic and acute conditions are known to elevate hsTnI results.  Refer to the Links section for chest pain algorithms and additional  guidance. Performed at [facility], [address] [place], [place] [address]   Resp panel by RT-PCR (RSV, Flu A&B, Covid) Anterior Nasal Swab     Status: None   Collection Time: 01/22/24 11:57 PM   Specimen: Anterior Nasal Swab  Result Value Ref Range   SARS Coronavirus 2 by RT PCR NEGATIVE NEGATIVE    Comment: (NOTE) SARS-CoV-2  target nucleic acids are NOT DETECTED.  The SARS-CoV-2 RNA is generally detectable in upper respiratory specimens during the acute phase of infection. The lowest concentration of SARS-CoV-2 viral copies this assay can detect is 138 copies/mL. A negative result does not preclude SARS-Cov-2 infection and should not be used as the sole basis for treatment or other patient management decisions. A negative  result [place]ridge occur with  improper specimen collection/handling, submission of specimen other than nasopharyngeal swab, presence of viral mutation(s) within the areas targeted by this assay, and inadequate number of viral copies(<138 copies/mL). A negative result must be combined with clinical observations, patient history, and epidemiological information. The expected result is Negative.  Fact Sheet for Patients:  bloggercourse.com  Fact Sheet for Healthcare Providers:  seriousbroker.it  This test is no t yet approved or cleared by the [place]  FDA and  has been authorized for detection and/or diagnosis of SARS-CoV-2 by FDA under an Emergency Use Authorization (EUA). This EUA will remain  in effect (meaning this test can be used) for the duration of the COVID-19 declaration under Section 564(b)(1) of the Act, 21 U.S.C.section 360bbb-3(b)(1), unless the authorization is terminated  or revoked sooner.       Influenza A by PCR NEGATIVE NEGATIVE   Influenza B by PCR NEGATIVE NEGATIVE    Comment: (NOTE) The Xpert Xpress SARS-CoV-2/FLU/RSV plus assay is intended as an aid in the diagnosis of influenza from Nasopharyngeal swab specimens and should not be used as a sole basis for treatment. Nasal washings and aspirates are unacceptable for Xpert Xpress SARS-CoV-2/FLU/RSV testing.  Fact Sheet for Patients: bloggercourse.com  Fact Sheet for Healthcare Providers: seriousbroker.it  This  test is not yet approved or cleared by the [place]  FDA and has been authorized for detection and/or diagnosis of SARS-CoV-2 by FDA under an Emergency Use Authorization (EUA). This EUA will remain in effect (meaning this test can be used) for the duration of the COVID-19 declaration under Section 564(b)(1) of the Act, 21 U.S.C. section 360bbb-3(b)(1), unless the authorization is terminated or revoked.     Resp Syncytial Virus by PCR NEGATIVE NEGATIVE    Comment: (NOTE) Fact Sheet for Patients: bloggercourse.com  Fact Sheet for Healthcare Providers: seriousbroker.it  This test is not yet approved or cleared by the [place]  FDA and has been authorized for detection and/or diagnosis of SARS-CoV-2 by FDA under an Emergency Use Authorization (EUA). This EUA will remain in effect (meaning this test can be used) for the duration of the COVID-19 declaration under Section 564(b)(1) of the Act, 21 U.S.C. section 360bbb-3(b)(1), unless the authorization is terminated or revoked.  Performed at [facility], [address] [place], [place] [address]   Troponin I (High Sensitivity)     Status: None   Collection Time: 01/22/24 11:57 PM  Result Value Ref Range   Troponin I (High Sensitivity) 10 <18 ng/L    Comment: (NOTE) Elevated high sensitivity troponin I (hsTnI) values and significant  changes across serial measurements Peifer suggest ACS but many other  chronic and acute conditions are known to elevate hsTnI results.  Refer to the Links section for chest pain algorithms and additional  guidance. Performed at [facility], [address] [place], [place] [address]   TSH     Status: None   Collection Time: 01/22/24 11:57 PM  Result Value Ref Range   TSH 3.144 0.350 - 4.500 uIU/mL    Comment: Performed by a 3rd Generation assay with a functional sensitivity of <=0.01 uIU/mL. Performed at [facility], [address] [place], [place] [address]   Comprehensive metabolic panel     Status: Abnormal   Collection Time: 01/23/24  5:55 AM  Result Value Ref Range   Sodium 142 135 - 145 mmol/L   Potassium 3.6 3.5 - 5.1 mmol/L   Chloride 105 98 - 111 mmol/L  CO2 26 22 - 32 mmol/L   Glucose, Bld 107 (H) 70 - 99 mg/dL    Comment: Glucose reference range applies only to samples taken after fasting for at least 8 hours.   BUN 22 8 - 23 mg/dL   Creatinine, Ser 8.86 (H) 0.44 - 1.00 mg/dL   Calcium  8.7 (L) 8.9 - 10.3 mg/dL   Total Protein 6.6 6.5 - 8.1 g/dL   Albumin 3.6 3.5 - 5.0 g/dL   AST 19 15 - 41 U/L   ALT 13 0 - 44 U/L   Alkaline Phosphatase 65 38 - 126 U/L   Total Bilirubin 0.5 0.0 - 1.2 mg/dL   GFR, Estimated 50 (L) >60 mL/min    Comment: (NOTE) Calculated using the CKD-EPI Creatinine Equation (2021)    Anion gap 11 5 - 15    Comment: Performed at [facility], [address] [place], [place] [address]  CBC     Status: None   Collection Time: 01/23/24  5:55 AM  Result Value Ref Range   WBC 7.5 4.0 - 10.5 K/uL   RBC 5.01 3.87 - 5.11 MIL/uL   Hemoglobin 13.9 12.0 - 15.0 g/dL   HCT 57.2 63.9 - 53.9 %   MCV 85.2 80.0 - 100.0 fL   MCH 27.7 26.0 - 34.0 pg   MCHC 32.6 30.0 - 36.0 g/dL   RDW 86.1 88.4 - 84.4 %   Platelets 279 150 - 400 K/uL   nRBC 0.0 0.0 - 0.2 %    Comment: Performed at [facility], [address] [place], [place] [address]  CBG monitoring, ED     Status: Abnormal   Collection Time: 01/23/24 11:51 AM  Result Value Ref Range   Glucose-Capillary 114 (H) 70 - 99 mg/dL    Comment: Glucose reference range applies only to samples taken after fasting for at least 8 hours.   Comment 1 Notify RN    Comment 2 Document in Chart    Comment 3 Call MD NNP PA CNM   Comprehensive metabolic panel     Status: Abnormal   Collection Time: 02/08/24 10:24 AM  Result Value Ref Range   Glucose 119 (H) 70 - 99 mg/dL   BUN 11 8 - 27 mg/dL   Creatinine,  Ser 8.96 (H) 0.57 - 1.00 mg/dL   eGFR 56 (L) >40 fO/fpw/8.26   BUN/Creatinine Ratio 11 (L) 12 - 28   Sodium 142 134 - 144 mmol/L   Potassium 4.2 3.5 - 5.2 mmol/L   Chloride 104 96 - 106 mmol/L   CO2 25 20 - 29 mmol/L   Calcium  9.0 8.7 - 10.3 mg/dL   Total Protein 5.9 (L) 6.0 - 8.5 g/dL   Albumin 3.7 (L) 3.8 - 4.8 g/dL   Globulin, Total 2.2 1.5 - 4.5 g/dL   Bilirubin Total 0.7 0.0 - 1.2 mg/dL   Alkaline Phosphatase 83 49 - 135 IU/L   AST 19 0 - 40 IU/L   ALT 8 0 - 32 IU/L  Basic metabolic panel     Status: Abnormal   Collection Time: 02/13/24 10:23 AM  Result Value Ref Range   Sodium 138 135 - 145 mmol/L   Potassium 3.7 3.5 - 5.1 mmol/L   Chloride 103 98 - 111 mmol/L   CO2 24 22 - 32 mmol/L   Glucose, Bld 125 (H) 70 - 99 mg/dL    Comment: Glucose reference range applies only to samples taken after fasting for at least 8  hours.   BUN 12 8 - 23 mg/dL   Creatinine, Ser 9.03 0.44 - 1.00 mg/dL   Calcium  8.4 (L) 8.9 - 10.3 mg/dL   GFR, Estimated >39 >39 mL/min    Comment: (NOTE) Calculated using the CKD-EPI Creatinine Equation (2021)    Anion gap 11 5 - 15    Comment: Performed at [facility], [address] [place], [place] [address]  CBC     Status: None   Collection Time: 02/13/24 10:23 AM  Result Value Ref Range   WBC 6.2 4.0 - 10.5 K/uL   RBC 4.95 3.87 - 5.11 MIL/uL   Hemoglobin 13.7 12.0 - 15.0 g/dL   HCT 58.0 63.9 - 53.9 %   MCV 84.6 80.0 - 100.0 fL   MCH 27.7 26.0 - 34.0 pg   MCHC 32.7 30.0 - 36.0 g/dL   RDW 86.3 88.4 - 84.4 %   Platelets 248 150 - 400 K/uL   nRBC 0.0 0.0 - 0.2 %    Comment: Performed at [facility], [address] [place], [place] [address]  BNP (Order if Patient has history of Heart Failure)     Status: Abnormal   Collection Time: 02/13/24 10:23 AM  Result Value Ref Range   B Natriuretic Peptide 321.5 (H) 0.0 - 100.0 pg/mL    Comment: Performed at [facility], [address] [place], [place] [address]   Troponin I (High Sensitivity)     Status: Abnormal   Collection Time: 02/13/24 10:23 AM  Result Value Ref Range   Troponin I (High Sensitivity) 18 (H) <18 ng/L    Comment: (NOTE) Elevated high sensitivity troponin I (hsTnI) values and significant  changes across serial measurements Ferrufino suggest ACS but many other  chronic and acute conditions are known to elevate hsTnI results.  Refer to the Links section for chest pain algorithms and additional  guidance. Performed at [facility], [address] [place], [place] [address]   Resp panel by RT-PCR (RSV, Flu A&B, Covid) Anterior Nasal Swab     Status: None   Collection Time: 02/13/24 12:54 PM   Specimen: Anterior Nasal Swab  Result Value Ref Range   SARS Coronavirus 2 by RT PCR NEGATIVE NEGATIVE    Comment: (NOTE) SARS-CoV-2 target nucleic acids are NOT DETECTED.  The SARS-CoV-2 RNA is generally detectable in upper respiratory specimens during the acute phase of infection. The lowest concentration of SARS-CoV-2 viral copies this assay can detect is 138 copies/mL. A negative result does not preclude SARS-Cov-2 infection and should not be used as the sole basis for treatment or other patient management decisions. A negative result Marschner occur with  improper specimen collection/handling, submission of specimen other than nasopharyngeal swab, presence of viral mutation(s) within the areas targeted by this assay, and inadequate number of viral copies(<138 copies/mL). A negative result must be combined with clinical observations, patient history, and epidemiological information. The expected result is Negative.  Fact Sheet for Patients:  bloggercourse.com  Fact Sheet for Healthcare Providers:  seriousbroker.it  This test is no t yet approved or cleared by the [place]  FDA and  has been authorized for detection and/or diagnosis of SARS-CoV-2 by FDA under an Emergency  Use Authorization (EUA). This EUA will remain  in effect (meaning this test can be used) for the duration of the COVID-19 declaration under Section 564(b)(1) of the Act, 21 U.S.C.section 360bbb-3(b)(1), unless the authorization is terminated  or revoked sooner.  Influenza A by PCR NEGATIVE NEGATIVE   Influenza B by PCR NEGATIVE NEGATIVE    Comment: (NOTE) The Xpert Xpress SARS-CoV-2/FLU/RSV plus assay is intended as an aid in the diagnosis of influenza from Nasopharyngeal swab specimens and should not be used as a sole basis for treatment. Nasal washings and aspirates are unacceptable for Xpert Xpress SARS-CoV-2/FLU/RSV testing.  Fact Sheet for Patients: bloggercourse.com  Fact Sheet for Healthcare Providers: seriousbroker.it  This test is not yet approved or cleared by the [place]  FDA and has been authorized for detection and/or diagnosis of SARS-CoV-2 by FDA under an Emergency Use Authorization (EUA). This EUA will remain in effect (meaning this test can be used) for the duration of the COVID-19 declaration under Section 564(b)(1) of the Act, 21 U.S.C. section 360bbb-3(b)(1), unless the authorization is terminated or revoked.     Resp Syncytial Virus by PCR NEGATIVE NEGATIVE    Comment: (NOTE) Fact Sheet for Patients: bloggercourse.com  Fact Sheet for Healthcare Providers: seriousbroker.it  This test is not yet approved or cleared by the [place]  FDA and has been authorized for detection and/or diagnosis of SARS-CoV-2 by FDA under an Emergency Use Authorization (EUA). This EUA will remain in effect (meaning this test can be used) for the duration of the COVID-19 declaration under Section 564(b)(1) of the Act, 21 U.S.C. section 360bbb-3(b)(1), unless the authorization is terminated or revoked.  Performed at [facility], [address]  [place], [place] [address]   Troponin I (High Sensitivity)     Status: Abnormal   Collection Time: 02/13/24 12:54 PM  Result Value Ref Range   Troponin I (High Sensitivity) 19 (H) <18 ng/L    Comment: (NOTE) Elevated high sensitivity troponin I (hsTnI) values and significant  changes across serial measurements Escandon suggest ACS but many other  chronic and acute conditions are known to elevate hsTnI results.  Refer to the Links section for chest pain algorithms and additional  guidance. Performed at [facility], [address] [place], [place] [address]   Respiratory (~20 pathogens) panel by PCR     Status: Abnormal   Collection Time: 02/13/24  7:49 PM   Specimen: Nasopharyngeal Swab; Respiratory  Result Value Ref Range   Adenovirus NOT DETECTED NOT DETECTED   Coronavirus 229E NOT DETECTED NOT DETECTED    Comment: (NOTE) The Coronavirus on the Respiratory Panel, DOES NOT test for the novel  Coronavirus (2019 nCoV)    Coronavirus HKU1 NOT DETECTED NOT DETECTED   Coronavirus NL63 NOT DETECTED NOT DETECTED   Coronavirus OC43 NOT DETECTED NOT DETECTED   Metapneumovirus NOT DETECTED NOT DETECTED   Rhinovirus / Enterovirus DETECTED (A) NOT DETECTED   Influenza A NOT DETECTED NOT DETECTED   Influenza B NOT DETECTED NOT DETECTED   Parainfluenza Virus 1 NOT DETECTED NOT DETECTED   Parainfluenza Virus 2 NOT DETECTED NOT DETECTED   Parainfluenza Virus 3 NOT DETECTED NOT DETECTED   Parainfluenza Virus 4 NOT DETECTED NOT DETECTED   Respiratory Syncytial Virus NOT DETECTED NOT DETECTED   Bordetella pertussis NOT DETECTED NOT DETECTED   Bordetella Parapertussis NOT DETECTED NOT DETECTED   Chlamydophila pneumoniae NOT DETECTED NOT DETECTED   Mycoplasma pneumoniae NOT DETECTED NOT DETECTED    Comment: Performed at [facility], [address], [place], [place] [address]  HIV Antibody (routine testing w rflx)     Status: None   Collection Time: 02/14/24  4:30 AM  Result Value  Ref Range   HIV Screen 4th  Generation wRfx Non Reactive Non Reactive    Comment: Performed at [facility], [address], [place], [place] [address]  CBC     Status: None   Collection Time: 02/14/24  4:30 AM  Result Value Ref Range   WBC 6.6 4.0 - 10.5 K/uL   RBC 4.90 3.87 - 5.11 MIL/uL   Hemoglobin 13.6 12.0 - 15.0 g/dL   HCT 58.9 63.9 - 53.9 %   MCV 83.7 80.0 - 100.0 fL   MCH 27.8 26.0 - 34.0 pg   MCHC 33.2 30.0 - 36.0 g/dL   RDW 86.4 88.4 - 84.4 %   Platelets 231 150 - 400 K/uL   nRBC 0.0 0.0 - 0.2 %    Comment: Performed at [facility], [address], [place], [place] [address]  Basic metabolic panel     Status: Abnormal   Collection Time: 02/14/24  4:30 AM  Result Value Ref Range   Sodium 137 135 - 145 mmol/L   Potassium 3.5 3.5 - 5.1 mmol/L   Chloride 102 98 - 111 mmol/L   CO2 25 22 - 32 mmol/L   Glucose, Bld 195 (H) 70 - 99 mg/dL    Comment: Glucose reference range applies only to samples taken after fasting for at least 8 hours.   BUN 20 8 - 23 mg/dL   Creatinine, Ser 9.06 0.44 - 1.00 mg/dL   Calcium  8.5 (L) 8.9 - 10.3 mg/dL   GFR, Estimated >39 >39 mL/min    Comment: (NOTE) Calculated using the CKD-EPI Creatinine Equation (2021)    Anion gap 10 5 - 15    Comment: Performed at [facility], [address] [place], [place] [address]  Magnesium      Status: None   Collection Time: 02/14/24  4:30 AM  Result Value Ref Range   Magnesium  2.1 1.7 - 2.4 mg/dL    Comment: Performed at [facility] [address] [place], [place] [address]  CBC     Status: Abnormal   Collection Time: 02/15/24  5:48 AM  Result Value Ref Range   WBC 14.1 (H) 4.0 - 10.5 K/uL   RBC 4.99 3.87 - 5.11 MIL/uL   Hemoglobin 13.6 12.0 - 15.0 g/dL   HCT 56.3 63.9 - 53.9 %   MCV 87.4 80.0 - 100.0 fL   MCH 27.3 26.0 - 34.0 pg   MCHC 31.2 30.0 - 36.0 g/dL   RDW 86.3 88.4 - 84.4 %   Platelets 317 150 - 400 K/uL   nRBC 0.0 0.0 - 0.2 %    Comment: Performed  at [facility], [address], [place], [place] [address]  Basic metabolic panel with GFR     Status: Abnormal   Collection Time: 02/15/24  5:48 AM  Result Value Ref Range   Sodium 138 135 - 145 mmol/L   Potassium 4.4 3.5 - 5.1 mmol/L   Chloride 103 98 - 111 mmol/L   CO2 26 22 - 32 mmol/L   Glucose, Bld 226 (H) 70 - 99 mg/dL    Comment: Glucose reference range applies only to samples taken after fasting for at least 8 hours.   BUN 29 (H) 8 - 23 mg/dL   Creatinine, Ser 8.71 (H) 0.44 - 1.00 mg/dL   Calcium  8.5 (L) 8.9 - 10.3 mg/dL   GFR, Estimated 43 (L) >60 mL/min    Comment: (NOTE) Calculated using the CKD-EPI Creatinine Equation (2021)    Anion gap 9 5 -  15    Comment: Performed at [facility], [address] [place], [place] [address]  POCT Urinalysis Dipstick [phone])     Status: Abnormal   Collection Time: 03/01/24  3:37 PM  Result Value Ref Range   Color, UA Yellow    Clarity, UA Clear    Glucose, UA Positive (A) Negative   Bilirubin, UA Negative    Ketones, UA Negative    Spec Grav, UA 1.015 1.010 - 1.025   Blood, UA Negative    pH, UA 5.5 5.0 - 8.0   Protein, UA Negative Negative   Urobilinogen, UA 0.2 0.2 or 1.0 E.U./dL   Nitrite, UA Negative    Leukocytes, UA Negative Negative   Appearance Clear    Odor No        Assessment & Plan:   Assessment & Plan Dysuria Other fatigue - Check UA/ urine culture/ - Start empiric macrobid  twice daily. Primary hypertension Mixed hyperlipidemia Prediabetes Class 3 severe obesity due to excess calories with serious comorbidity and body mass index (BMI) of 45.0 to 49.9 in adult [facility]) - Continue healthy diet and exercise as tolerated. - Continue medications as prescribed.     No follow-ups on file.   Total time spent: 25 minutes  Oddis DELENA Cain, FNP  03/01/2024   This document Beeks have been prepared by [facility] Voice Recognition software and as such Olund include unintentional dictation  errors.

## 2024-03-01 NOTE — Assessment & Plan Note (Signed)
-   Check UA/ urine culture/ - Start empiric macrobid  twice daily.

## 2024-03-01 NOTE — Telephone Encounter (Signed)
Discussed at follow up

## 2024-03-02 ENCOUNTER — Encounter: Payer: Self-pay | Admitting: Family

## 2024-03-03 ENCOUNTER — Ambulatory Visit: Payer: Self-pay

## 2024-03-03 LAB — URINE CULTURE

## 2024-03-05 NOTE — Progress Notes (Signed)
 [facility] Refill Coordination Note    Specialty Medication(s) to be Shipped:   Inflammatory Disorders: Taltz     Other medication(s) to be shipped: No additional medications requested for fill at this time    Specialty Medications not needed at this time: N/A     Lisa Carlson, DOB: 08-13-1945  Phone: [phone] (home)       All above HIPAA information was verified with patient.     Was a nurse, learning disability used for this call? No    Completed refill call assessment today to schedule patient's medication shipment from the [facility]  [phone]).  All relevant notes have been reviewed.     Specialty medication(s) and dose(s) confirmed: Regimen is correct and unchanged.   Changes to medications: Lisa Carlson reports no changes at this time.  Changes to insurance: No  New side effects reported not previously addressed with a pharmacist or physician: None reported  Questions for the pharmacist: No    Confirmed patient received a Conservation Officer, Historic Buildings and a Surveyor, Mining with first shipment. The patient will receive a drug information handout for each medication shipped and additional FDA Medication Guides as required.       DISEASE/MEDICATION-SPECIFIC INFORMATION        For patients on injectable medications: Next injection is scheduled for 03/30/24.    SPECIALTY MEDICATION ADHERENCE     Medication Adherence    Patient reported X missed doses in the last month: 0  Specialty Medication: TALTZ  AUTOINJECTOR 80 mg/mL Atin auto-injector (ixekizumab )  Patient is on additional specialty medications: No              Were doses missed due to medication being on hold? No     TALTZ  AUTOINJECTOR 80 mg/mL Atin auto-injector (ixekizumab ): 0 doses of medicine on hand       REFERRAL TO PHARMACIST     Referral to the pharmacist: Not needed      [facility]     Shipping address confirmed in Epic.     Cost and Payment: Patient has a $0 copay, payment information is not required.    Delivery Scheduled: Yes, Expected medication delivery date: 03/19/24.     Medication will be delivered via Same Day Courier to the prescription address in Epic WAM.    Lisa Carlson   [facility]  Specialty Technician

## 2024-03-06 ENCOUNTER — Encounter: Payer: Self-pay | Admitting: Family

## 2024-03-06 ENCOUNTER — Ambulatory Visit: Admitting: Family

## 2024-03-06 ENCOUNTER — Other Ambulatory Visit: Payer: Self-pay

## 2024-03-06 VITALS — BP 118/77 | HR 82 | Temp 95.6°F | Ht 65.0 in | Wt 286.6 lb

## 2024-03-06 DIAGNOSIS — R7303 Prediabetes: Secondary | ICD-10-CM

## 2024-03-06 DIAGNOSIS — E559 Vitamin D deficiency, unspecified: Secondary | ICD-10-CM

## 2024-03-06 DIAGNOSIS — I4891 Unspecified atrial fibrillation: Secondary | ICD-10-CM

## 2024-03-06 DIAGNOSIS — E66813 Obesity, class 3: Secondary | ICD-10-CM

## 2024-03-06 DIAGNOSIS — E538 Deficiency of other specified B group vitamins: Secondary | ICD-10-CM

## 2024-03-06 DIAGNOSIS — I10 Essential (primary) hypertension: Secondary | ICD-10-CM | POA: Diagnosis not present

## 2024-03-06 DIAGNOSIS — R5383 Other fatigue: Secondary | ICD-10-CM

## 2024-03-06 DIAGNOSIS — R42 Dizziness and giddiness: Secondary | ICD-10-CM

## 2024-03-06 DIAGNOSIS — E782 Mixed hyperlipidemia: Secondary | ICD-10-CM | POA: Diagnosis not present

## 2024-03-06 DIAGNOSIS — Z6841 Body Mass Index (BMI) 40.0 and over, adult: Secondary | ICD-10-CM

## 2024-03-06 DIAGNOSIS — E039 Hypothyroidism, unspecified: Secondary | ICD-10-CM

## 2024-03-06 DIAGNOSIS — J449 Chronic obstructive pulmonary disease, unspecified: Secondary | ICD-10-CM

## 2024-03-06 DIAGNOSIS — R5381 Other malaise: Secondary | ICD-10-CM

## 2024-03-06 DIAGNOSIS — M791 Myalgia, unspecified site: Secondary | ICD-10-CM

## 2024-03-06 MED ORDER — DOXYCYCLINE HYCLATE 100 MG PO TABS: 100.0000 mg | ORAL_TABLET | Freq: Two times a day (BID) | ORAL | 0 refills | Status: AC

## 2024-03-06 NOTE — Assessment & Plan Note (Signed)
-   Check labs today - Supplementation recommended based off lab results and will notify patient at that time  - Check monospot and lyme panel. - Recommend ED if symptoms worsen, persist, new symptoms arise. - Empiric Doxycycline  twice day for 7 days.

## 2024-03-06 NOTE — Assessment & Plan Note (Signed)
-   Continue healthy diet and exercise as tolerated. - Continue medications as prescribed. - Check labs today  - Recommend FU with Cardiologist due to missed appointmnet

## 2024-03-06 NOTE — Assessment & Plan Note (Signed)
Check blood work today.

## 2024-03-06 NOTE — Patient Instructions (Signed)
 Visit Information  Thank you for taking time to visit with me today. Please don't hesitate to contact me if I can be of assistance to you before our next scheduled telephone appointment.  Our next appointment is by telephone on Thursday December 4th at 2:00pm   Following is a copy of your care plan:   Goals Addressed             This Visit's Progress    VBCI Transitions of Care (TOC) Care Plan       Problems: (reviewed 03/05/24) Recent Hospitalization for treatment of COPD Hospital or ED Adm Risk 97%  Goal: (reviewed 03/05/24) Over the next 30 days, the patient will not experience hospital readmission  Interventions: (reviewed 03/05/24)  COPD Interventions:(reviewed 03/05/24) Advised patient to track and manage COPD triggers Discussed the importance of adequate rest and management of fatigue with COPD Provided education about and advised patient to utilize infection prevention strategies to reduce risk of respiratory infection Provided instruction about proper use of medications used for management of COPD including inhalers Take Albuterol  108 mcg/act inhaler 1-2 puff every four hours as needed FT Mucus Relief DM 30-600mg  tab two times a day for 10 days - 02/25/24 Impratropium-albuterol  0.5-2.5 mg/3ml every six hours as needed Spiriva  Respimate 2.32mcg 2 puffs daily - 11/18 - Predisone taper, Z-Pack, Singulair  Follow up with PCP in one week - 03/06/24 - Completed 03/06/24 - The patient has been started on Doxycycline  100mg  for a week due to not feeling much better  Patient Self Care Activities: (reviewed 03/05/24) Attend all scheduled provider appointments Call pharmacy for medication refills 3-7 days in advance of running out of medications Call provider office for new concerns or questions  Notify RN Care Manager of [facility] call rescheduling needs Participate in Transition of Care Program/Attend [facility] scheduled calls Perform all self care activities independently  Take  medications as prescribed    Plan:  Telephone follow up appointment with care management team member scheduled for:  Thursday December 4th at 2:00pm        Patient verbalizes understanding of instructions and care plan provided today and agrees to view in MyChart. Active MyChart status and patient understanding of how to access instructions and care plan via MyChart confirmed with patient.     The patient has been provided with contact information for the care management team and has been advised to call with any health related questions or concerns.   Please call the care guide team at [phone] if you need to cancel or reschedule your appointment.   Please call the Suicide and Crisis Lifeline: 988 call the [place]  National Suicide Prevention Lifeline: (832)718-9442 or TTY: 581-622-2384 TTY 602-874-4922) to talk to a trained counselor if you are experiencing a Mental Health or Behavioral Health Crisis or need someone to talk to.  Medford Balboa, BSN, RN [place]  VBCI - [employer] [facility] RN Care Manager [phone]

## 2024-03-06 NOTE — Progress Notes (Signed)
 Established Patient Office Visit  Subjective:  Patient ID: Patricia Walker, female    DOB: Hartzell 08, 1947  Age: 78 y.o. MRN: [id number]  Chief Complaint  Patient presents with   Follow-up    Follow up. Still dizzy,weak, and tired.    Patient is here today for her follow up.  She has been feeling poorly since last appointment.   She does have additional concerns to discuss today. She reports postural dizziness, feeling tired, and weak. Reports feeling hot/sweaty and clammy. Reports decreased appetite. Random glucose today is 103. Patient reports taking her medications as prescribed and denies any side effects. Recommend patient FU with her Cardiologist for missed appointment due to hospitalization. Will check blood work today. Recommend patient go to ED if symptoms worsen or she begins having, chest pain, shortness of breath. Will start Doxycycline  twice day for 7 days due to patients symptoms and hx of COPD.   Labs are due today.  She needs refills.   I have reviewed her active problem list, medication list, allergies, family history, social history, health maintenance, notes from last encounter, lab results for her appointment today.      No other concerns at this time.   Past Medical History:  Diagnosis Date   Anxiety    Arthritis    Asthma    CHF (congestive heart failure) (HCC)    COPD exacerbation (HCC) 10/08/2020   Coronary artery disease    mild, nonobstructive   Depression    Dyspnea    on exertion   Dysrhythmia    Atrial Fibrillation   GERD (gastroesophageal reflux disease)    Heart murmur    Hyperlipidemia    Hypertension    Hypothyroidism    MI, old 2016   nonobstructive CAD by Cath   Morbid obesity (HCC)    Persistent atrial fibrillation (HCC)    Pneumonia 06/2018   and RSV   Psoriasis    Sleep apnea    compliant with CPAP   Thyroid  disease     Past Surgical History:  Procedure Laterality Date   ARTERY BIOPSY Right 07/21/2017   Procedure: BIOPSY  TEMPORAL ARTERY;  Surgeon: Jama Cordella MATSU, MD;  Location: [facility];  Service: Vascular;  Laterality: Right;   CARDIAC CATHETERIZATION     CARDIOVERSION Right 09/01/2016   Procedure: Cardioversion;  Surgeon: Fernand Denyse LABOR, MD;  Location: [facility];  Service: Cardiovascular;  Laterality: Right;   CARDIOVERSION N/A 09/09/2016   Procedure: Cardioversion;  Surgeon: Fernand Denyse LABOR, MD;  Location: [facility];  Service: Cardiovascular;  Laterality: N/A;   CATARACT EXTRACTION W/PHACO Right 03/26/2019   Procedure: CATARACT EXTRACTION PHACO AND INTRAOCULAR LENS PLACEMENT (IOC) RIGHT 6.45, 00:39.9;  Surgeon: Jaye Fallow, MD;  Location: [facility];  Service: Ophthalmology;  Laterality: Right;   CATARACT EXTRACTION W/PHACO Left 04/16/2019   Procedure: CATARACT EXTRACTION PHACO AND INTRAOCULAR LENS PLACEMENT (IOC) LEFT;   3.14, 00:24.9;  Surgeon: Jaye Fallow, MD;  Location: [facility];  Service: Ophthalmology;  Laterality: Left;  sleep apnea-CPAP   COLONOSCOPY WITH PROPOFOL  N/A 08/28/2019   Procedure: COLONOSCOPY WITH PROPOFOL ;  Surgeon: Toledo, Ladell POUR, MD;  Location: [facility];  Service: Gastroenterology;  Laterality: N/A;   ELECTROPHYSIOLOGIC STUDY N/A 02/01/2016   Procedure: CARDIOVERSION;  Surgeon: Denyse LABOR Fernand, MD;  Location: [facility];  Service: Cardiovascular;  Laterality: N/A;   ESOPHAGEAL DILATION     ESOPHAGOGASTRODUODENOSCOPY (EGD) WITH PROPOFOL  N/A 02/06/2017   Procedure: ESOPHAGOGASTRODUODENOSCOPY (EGD) WITH PROPOFOL ;  Surgeon: Therisa Bi, MD;  Location: [facility];  Service: Gastroenterology;  Laterality: N/A;   ESOPHAGOGASTRODUODENOSCOPY (EGD) WITH PROPOFOL  N/A 08/28/2019   Procedure: ESOPHAGOGASTRODUODENOSCOPY (EGD) WITH PROPOFOL ;  Surgeon: Toledo, Ladell POUR, MD;  Location: [facility];  Service: Gastroenterology;  Laterality: N/A;   EUS N/A 02/16/2017   Procedure: FULL UPPER ENDOSCOPIC ULTRASOUND (EUS) RADIAL;  Surgeon: Elta Fonda SQUIBB, MD;  Location:  [facility];  Service: Gastroenterology;  Laterality: N/A;   LEFT HEART CATH AND CORONARY ANGIOGRAPHY N/A 09/08/2016   Procedure: Left Heart Cath and Coronary Angiography;  Surgeon: Fernand Denyse LABOR, MD;  Location: [facility];  Service: Cardiovascular;  Laterality: N/A;   US  ECHOCARDIOGRAPHY      Social History   Socioeconomic History   Marital status: Divorced    Spouse name: Not on file   Number of children: Not on file   Years of education: Not on file   Highest education level: Not on file  Occupational History   Not on file  Tobacco Use   Smoking status: Former    Current packs/day: 0.00    Types: Cigarettes    Quit date: 02/01/2013    Years since quitting: 11.0   Smokeless tobacco: Never  Vaping Use   Vaping status: Never Used  Substance and Sexual Activity   Alcohol use: No   Drug use: No   Sexual activity: Not on file  Other Topics Concern   Not on file  Social History Narrative   Lives in [place] with multiple family members   unemployed   Social Drivers of Health   Financial Resource Strain: Low Risk  (10/23/2023)   Received from [facility]   Overall Financial Resource Strain (CARDIA)    Difficulty of Paying Living Expenses: Not very hard  Food Insecurity: No Food Insecurity (02/19/2024)   Hunger Vital Sign    Worried About Running Out of Food in the Last Year: Never true    Ran Out of Food in the Last Year: Never true  Transportation Needs: No Transportation Needs (02/19/2024)   PRAPARE - Administrator, Civil Service (Medical): No    Lack of Transportation (Non-Medical): No  Physical Activity: Not on file  Stress: Not on file  Social Connections: Moderately Integrated (02/13/2024)   Social Connection and Isolation Panel    Frequency of Communication with Friends and Family: More than three times a week    Frequency of Social Gatherings with Friends and Family: More than three times a week    Attends Religious  Services: 1 to 4 times per year    Active Member of [employer] or Organizations: Yes    Attends Banker Meetings: 1 to 4 times per year    Marital Status: Divorced  Catering Manager Violence: Not At Risk (02/19/2024)   Humiliation, Afraid, Rape, and Kick questionnaire    Fear of Current or Ex-Partner: No    Emotionally Abused: No    Physically Abused: No    Sexually Abused: No    Family History  Problem Relation Age of Onset   Breast cancer Paternal Aunt    Other Father    Heart attack Father     Allergies  Allergen Reactions   Codeine Hives, Itching and Rash   Other Itching and Other (See Comments)    States antibiotic in the past caused itching but can not remember name    Review of Systems  Constitutional:  Positive for chills, diaphoresis and malaise/fatigue.  HENT: Negative.  Negative for  congestion, sinus pain and sore throat.   Eyes:  Positive for blurred vision. Negative for pain.  Respiratory:  Negative for cough and shortness of breath.   Cardiovascular:  Positive for palpitations. Negative for chest pain, claudication and leg swelling.  Gastrointestinal:  Negative for abdominal pain, blood in stool, constipation, diarrhea, nausea and vomiting.  Genitourinary:  Negative for dysuria, frequency and urgency.  Musculoskeletal:  Positive for back pain (right flank).  Skin: Negative.   Neurological:  Positive for dizziness and headaches. Negative for tingling and sensory change.  Endo/Heme/Allergies: Negative.   Psychiatric/Behavioral: Negative.         Objective:   BP 118/77   Pulse 82   Temp (!) 95.6 F (35.3 C)   Ht 5' 5 (1.651 m)   Wt 286 lb 9.6 oz (130 kg)   SpO2 99%   BMI 47.69 kg/m   Vitals:   03/06/24 1113  BP: 118/77  Pulse: 82  Temp: (!) 95.6 F (35.3 C)  Height: 5' 5 (1.651 m)  Weight: 286 lb 9.6 oz (130 kg)  SpO2: 99%  BMI (Calculated): 47.69    Physical Exam Vitals and nursing note reviewed.  Constitutional:       Appearance: Normal appearance.  HENT:     Head: Normocephalic.  Eyes:     Extraocular Movements: Extraocular movements intact.     Pupils: Pupils are equal, round, and reactive to light.  Cardiovascular:     Rate and Rhythm: Normal rate and regular rhythm.     Pulses: Normal pulses.     Heart sounds: Normal heart sounds. No murmur heard. Pulmonary:     Effort: Pulmonary effort is normal. No respiratory distress.     Breath sounds: Normal breath sounds.  Abdominal:     General: There is no distension.     Tenderness: There is no abdominal tenderness.  Musculoskeletal:        General: No tenderness. Normal range of motion.     Cervical back: Normal range of motion and neck supple.     Right lower leg: No edema.     Left lower leg: No edema.  Skin:    General: Skin is warm and moist.     Coloration: Skin is pale. Skin is not jaundiced.     Findings: No erythema.  Neurological:     General: No focal deficit present.     Mental Status: She is alert and oriented to person, place, and time.  Psychiatric:        Mood and Affect: Mood normal.        Speech: Speech normal.        Behavior: Behavior is cooperative.        Cognition and Memory: Memory is not impaired.      No results found for any visits on 03/06/24.  Recent Results (from the past 2160 hours)  CMP14+EGFR     Status: Abnormal   Collection Time: 12/22/23  1:59 PM  Result Value Ref Range   Glucose 88 70 - 99 mg/dL   BUN 14 8 - 27 mg/dL   Creatinine, Ser 8.93 (H) 0.57 - 1.00 mg/dL   eGFR 54 (L) >40 fO/fpw/8.26   BUN/Creatinine Ratio 13 12 - 28   Sodium 141 134 - 144 mmol/L   Potassium 4.0 3.5 - 5.2 mmol/L   Chloride 100 96 - 106 mmol/L   CO2 25 20 - 29 mmol/L   Calcium  9.0 8.7 - 10.3 mg/dL   Total Protein  6.4 6.0 - 8.5 g/dL   Albumin 4.1 3.8 - 4.8 g/dL   Globulin, Total 2.3 1.5 - 4.5 g/dL   Bilirubin Total 0.9 0.0 - 1.2 mg/dL   Alkaline Phosphatase 87 44 - 121 IU/L    Comment: **Effective December 25, 2023  Alkaline Phosphatase**   reference interval will be changing to:              Age                Female          Female           0 -  5 days         47 - 127       47 - 127           6 - 10 days         29 - 242       29 - 242          11 - 20 days        109 - 357      109 - 357          21 - 30 days         94 - 494       94 - 494           1 -  2 months      149 - 539      149 - 539           3 -  6 months      131 - 452      131 - 452           7 - 11 months      117 - 401      117 - 401   12 months -  6 years       158 - 369      158 - 369           7 - 12 years       150 - 409      150 - 409               13 years       156 - 435       78 - 227               14 years       114 - 375       64 - 161               15 years        88 - 279       56 - 134               16 years        74 - 207       51 - 121               17 years        63 - 161       47 - 113          18 - 20 years        51 - 125       42 - 106          21 - 50 years  47 - 123       41 - 116          51 - 80 years        49 - 135       51 - 125              >80 years        48 - 129       48 - 129    AST 21 0 - 40 IU/L   ALT 13 0 - 32 IU/L  Lipid panel     Status: Abnormal   Collection Time: 12/22/23  1:59 PM  Result Value Ref Range   Cholesterol, Total 209 (H) 100 - 199 mg/dL   Triglycerides 881 0 - 149 mg/dL   HDL 39 (L) >60 mg/dL   VLDL Cholesterol Cal 21 5 - 40 mg/dL   LDL Chol Calc (NIH) 850 (H) 0 - 99 mg/dL   Chol/HDL Ratio 5.4 (H) 0.0 - 4.4 ratio    Comment:                                   T. Chol/HDL Ratio                                             Men  Women                               1/2 Avg.Risk  3.4    3.3                                   Avg.Risk  5.0    4.4                                2X Avg.Risk  9.6    7.1                                3X Avg.Risk 23.4   11.0   Iron, TIBC and Ferritin Panel     Status: None   Collection Time: 12/22/23  1:59 PM  Result Value Ref Range    Total Iron Binding Capacity 336 250 - 450 ug/dL   UIBC 716 881 - 630 ug/dL   Iron 53 27 - 860 ug/dL   Iron Saturation 16 15 - 55 %   Ferritin 93 15 - 150 ng/mL  VITAMIN D  25 Hydroxy (Vit-D Deficiency, Fractures)     Status: Abnormal   Collection Time: 12/22/23  1:59 PM  Result Value Ref Range   Vit D, 25-Hydroxy 18.5 (L) 30.0 - 100.0 ng/mL    Comment: Vitamin D  deficiency has been defined by the Institute of Medicine and an Endocrine Society practice guideline as a level of serum 25-OH vitamin D  less than 20 ng/mL (1,2). The Endocrine Society went on to further define vitamin D  insufficiency as a level between 21 and 29 ng/mL (2). 1. IOM (Institute of Medicine). 2010. Dietary reference    intakes for calcium  and D. [place]: The  [employer]. 2. Holick MF, Binkley [place], Bischoff-Ferrari HA, et al.    Evaluation, treatment, and prevention of vitamin D     deficiency: an Endocrine Society clinical practice    guideline. JCEM. 2011 Jul; 96(7):1911-30.   Vitamin B12     Status: None   Collection Time: 12/22/23  1:59 PM  Result Value Ref Range   Vitamin B-12 522 232 - 1,245 pg/mL  CBC with Diff     Status: None   Collection Time: 12/22/23  1:59 PM  Result Value Ref Range   WBC 5.7 3.4 - 10.8 x10E3/uL   RBC 4.95 3.77 - 5.28 x10E6/uL   Hemoglobin 14.1 11.1 - 15.9 g/dL   Hematocrit 56.9 65.9 - 46.6 %   MCV 87 79 - 97 fL   MCH 28.5 26.6 - 33.0 pg   MCHC 32.8 31.5 - 35.7 g/dL   RDW 85.7 88.2 - 84.5 %   Platelets 298 150 - 450 x10E3/uL   Neutrophils 65 Not Estab. %   Lymphs 22 Not Estab. %   Monocytes 9 Not Estab. %   Eos 2 Not Estab. %   Basos 1 Not Estab. %   Neutrophils Absolute 3.7 1.4 - 7.0 x10E3/uL   Lymphocytes Absolute 1.3 0.7 - 3.1 x10E3/uL   Monocytes Absolute 0.5 0.1 - 0.9 x10E3/uL   EOS (ABSOLUTE) 0.1 0.0 - 0.4 x10E3/uL   Basophils Absolute 0.1 0.0 - 0.2 x10E3/uL   Immature Granulocytes 0 Not Estab. %   Immature Grans (Abs) 0.0 0.0 - 0.1 x10E3/uL   Hemoglobin A1c     Status: Abnormal   Collection Time: 12/22/23  1:59 PM  Result Value Ref Range   Hgb A1c MFr Bld 6.0 (H) 4.8 - 5.6 %    Comment:          Prediabetes: 5.7 - 6.4          Diabetes: >6.4          Glycemic control for adults with diabetes: <7.0    Est. average glucose Bld gHb Est-mCnc 126 mg/dL  [id number]     Status: None   Collection Time: 12/22/23  1:59 PM  Result Value Ref Range   TSH 2.070 0.450 - 4.500 uIU/mL   T3, Free 3.0 2.0 - 4.4 pg/mL   Free T4 1.07 0.82 - 1.77 ng/dL  Magnesium      Status: None   Collection Time: 12/22/23  1:59 PM  Result Value Ref Range   Magnesium  1.9 1.6 - 2.3 mg/dL  Specimen status report     Status: None   Collection Time: 12/22/23  1:59 PM  Result Value Ref Range   specimen status report Comment     Comment: Written Authorization Written Authorization Written Authorization Received. Authorization received from Rosina Mae for [employer] on 12-26-2023 Logged by Joesph Ion   Urinalysis, Routine w reflex microscopic -Urine, Clean Catch     Status: Abnormal   Collection Time: 01/22/24  5:30 PM  Result Value Ref Range   Color, Urine YELLOW (A) YELLOW   APPearance CLEAR (A) CLEAR   Specific Gravity, Urine 1.016 1.005 - 1.030   pH 5.0 5.0 - 8.0   Glucose, UA NEGATIVE NEGATIVE mg/dL   Hgb urine dipstick NEGATIVE NEGATIVE   Bilirubin Urine NEGATIVE NEGATIVE   Ketones, ur NEGATIVE NEGATIVE mg/dL   Protein, ur NEGATIVE NEGATIVE mg/dL   Nitrite NEGATIVE NEGATIVE   Leukocytes,Ua NEGATIVE NEGATIVE    Comment: Performed at [facility], [address]  [place], [place] [address]  Comprehensive metabolic panel     Status: Abnormal   Collection Time: 01/22/24  5:31 PM  Result Value Ref Range   Sodium 141 135 - 145 mmol/L   Potassium 3.8 3.5 - 5.1 mmol/L   Chloride 103 98 - 111 mmol/L   CO2 26 22 - 32 mmol/L   Glucose, Bld 76 70 - 99 mg/dL    Comment: Glucose reference range applies only to samples taken after  fasting for at least 8 hours.   BUN 20 8 - 23 mg/dL   Creatinine, Ser 8.84 (H) 0.44 - 1.00 mg/dL   Calcium  8.5 (L) 8.9 - 10.3 mg/dL   Total Protein 6.6 6.5 - 8.1 g/dL   Albumin 3.3 (L) 3.5 - 5.0 g/dL   AST 18 15 - 41 U/L   ALT 13 0 - 44 U/L   Alkaline Phosphatase 60 38 - 126 U/L   Total Bilirubin 0.9 0.0 - 1.2 mg/dL   GFR, Estimated 49 (L) >60 mL/min    Comment: (NOTE) Calculated using the CKD-EPI Creatinine Equation (2021)    Anion gap 12 5 - 15    Comment: Performed at [facility], [address] [place], [place] [address]  CBC     Status: None   Collection Time: 01/22/24  5:31 PM  Result Value Ref Range   WBC 7.5 4.0 - 10.5 K/uL   RBC 5.09 3.87 - 5.11 MIL/uL   Hemoglobin 14.2 12.0 - 15.0 g/dL   HCT 56.7 63.9 - 53.9 %   MCV 84.9 80.0 - 100.0 fL   MCH 27.9 26.0 - 34.0 pg   MCHC 32.9 30.0 - 36.0 g/dL   RDW 86.3 88.4 - 84.4 %   Platelets 304 150 - 400 K/uL   nRBC 0.0 0.0 - 0.2 %    Comment: Performed at [facility], [address] [place], [place] [address]  Brain natriuretic peptide     Status: Abnormal   Collection Time: 01/22/24  5:31 PM  Result Value Ref Range   B Natriuretic Peptide 144.7 (H) 0.0 - 100.0 pg/mL    Comment: Performed at [facility], [address] [place], [place] [address]  Troponin I (High Sensitivity)     Status: None   Collection Time: 01/22/24  5:31 PM  Result Value Ref Range   Troponin I (High Sensitivity) 11 <18 ng/L    Comment: (NOTE) Elevated high sensitivity troponin I (hsTnI) values and significant  changes across serial measurements [place]ell suggest ACS but many other  chronic and acute conditions are known to elevate hsTnI results.  Refer to the Links section for chest pain algorithms and additional  guidance. Performed at [facility], [address] [place], [place] [address]   Resp panel by RT-PCR (RSV, Flu A&B, Covid) Anterior Nasal Swab     Status: None   Collection Time: 01/22/24 11:57  PM   Specimen: Anterior Nasal Swab  Result Value Ref Range   SARS Coronavirus 2 by RT PCR NEGATIVE NEGATIVE    Comment: (NOTE) SARS-CoV-2 target nucleic acids are NOT DETECTED.  The SARS-CoV-2 RNA is generally detectable in upper respiratory specimens during the acute phase of infection. The lowest concentration of SARS-CoV-2 viral copies this assay can detect is 138 copies/mL. A negative result does not preclude SARS-Cov-2 infection and should not be used as the sole basis for treatment or other patient management decisions. A negative result Ishee occur with  improper specimen collection/handling, submission of  specimen other than nasopharyngeal swab, presence of viral mutation(s) within the areas targeted by this assay, and inadequate number of viral copies(<138 copies/mL). A negative result must be combined with clinical observations, patient history, and epidemiological information. The expected result is Negative.  Fact Sheet for Patients:  bloggercourse.com  Fact Sheet for Healthcare Providers:  seriousbroker.it  This test is no t yet approved or cleared by the [place]  FDA and  has been authorized for detection and/or diagnosis of SARS-CoV-2 by FDA under an Emergency Use Authorization (EUA). This EUA will remain  in effect (meaning this test can be used) for the duration of the COVID-19 declaration under Section 564(b)(1) of the Act, 21 U.S.C.section 360bbb-3(b)(1), unless the authorization is terminated  or revoked sooner.       Influenza A by PCR NEGATIVE NEGATIVE   Influenza B by PCR NEGATIVE NEGATIVE    Comment: (NOTE) The Xpert Xpress SARS-CoV-2/FLU/RSV plus assay is intended as an aid in the diagnosis of influenza from Nasopharyngeal swab specimens and should not be used as a sole basis for treatment. Nasal washings and aspirates are unacceptable for Xpert Xpress SARS-CoV-2/FLU/RSV testing.  Fact Sheet for  Patients: bloggercourse.com  Fact Sheet for Healthcare Providers: seriousbroker.it  This test is not yet approved or cleared by the [place]  FDA and has been authorized for detection and/or diagnosis of SARS-CoV-2 by FDA under an Emergency Use Authorization (EUA). This EUA will remain in effect (meaning this test can be used) for the duration of the COVID-19 declaration under Section 564(b)(1) of the Act, 21 U.S.C. section 360bbb-3(b)(1), unless the authorization is terminated or revoked.     Resp Syncytial Virus by PCR NEGATIVE NEGATIVE    Comment: (NOTE) Fact Sheet for Patients: bloggercourse.com  Fact Sheet for Healthcare Providers: seriousbroker.it  This test is not yet approved or cleared by the [place]  FDA and has been authorized for detection and/or diagnosis of SARS-CoV-2 by FDA under an Emergency Use Authorization (EUA). This EUA will remain in effect (meaning this test can be used) for the duration of the COVID-19 declaration under Section 564(b)(1) of the Act, 21 U.S.C. section 360bbb-3(b)(1), unless the authorization is terminated or revoked.  Performed at [facility], [address] [place], [place] [address]   Troponin I (High Sensitivity)     Status: None   Collection Time: 01/22/24 11:57 PM  Result Value Ref Range   Troponin I (High Sensitivity) 10 <18 ng/L    Comment: (NOTE) Elevated high sensitivity troponin I (hsTnI) values and significant  changes across serial measurements Rieger suggest ACS but many other  chronic and acute conditions are known to elevate hsTnI results.  Refer to the Links section for chest pain algorithms and additional  guidance. Performed at [facility], [address] [place], [place] [address]   TSH     Status: None   Collection Time: 01/22/24 11:57 PM  Result Value Ref Range   TSH 3.144 0.350  - 4.500 uIU/mL    Comment: Performed by a 3rd Generation assay with a functional sensitivity of <=0.01 uIU/mL. Performed at [facility], [address] [place], [place] [address]   Comprehensive metabolic panel     Status: Abnormal   Collection Time: 01/23/24  5:55 AM  Result Value Ref Range   Sodium 142 135 - 145 mmol/L   Potassium 3.6 3.5 - 5.1 mmol/L   Chloride 105 98 - 111 mmol/L   CO2 26 22 - 32 mmol/L   Glucose,  Bld 107 (H) 70 - 99 mg/dL    Comment: Glucose reference range applies only to samples taken after fasting for at least 8 hours.   BUN 22 8 - 23 mg/dL   Creatinine, Ser 8.86 (H) 0.44 - 1.00 mg/dL   Calcium  8.7 (L) 8.9 - 10.3 mg/dL   Total Protein 6.6 6.5 - 8.1 g/dL   Albumin 3.6 3.5 - 5.0 g/dL   AST 19 15 - 41 U/L   ALT 13 0 - 44 U/L   Alkaline Phosphatase 65 38 - 126 U/L   Total Bilirubin 0.5 0.0 - 1.2 mg/dL   GFR, Estimated 50 (L) >60 mL/min    Comment: (NOTE) Calculated using the CKD-EPI Creatinine Equation (2021)    Anion gap 11 5 - 15    Comment: Performed at [facility], [address] [place], [place] [address]  CBC     Status: None   Collection Time: 01/23/24  5:55 AM  Result Value Ref Range   WBC 7.5 4.0 - 10.5 K/uL   RBC 5.01 3.87 - 5.11 MIL/uL   Hemoglobin 13.9 12.0 - 15.0 g/dL   HCT 57.2 63.9 - 53.9 %   MCV 85.2 80.0 - 100.0 fL   MCH 27.7 26.0 - 34.0 pg   MCHC 32.6 30.0 - 36.0 g/dL   RDW 86.1 88.4 - 84.4 %   Platelets 279 150 - 400 K/uL   nRBC 0.0 0.0 - 0.2 %    Comment: Performed at [facility], [address] [place], [place] [address]  CBG monitoring, ED     Status: Abnormal   Collection Time: 01/23/24 11:51 AM  Result Value Ref Range   Glucose-Capillary 114 (H) 70 - 99 mg/dL    Comment: Glucose reference range applies only to samples taken after fasting for at least 8 hours.   Comment 1 Notify RN    Comment 2 Document in Chart    Comment 3 Call MD NNP PA CNM   Comprehensive metabolic panel     Status:  Abnormal   Collection Time: 02/08/24 10:24 AM  Result Value Ref Range   Glucose 119 (H) 70 - 99 mg/dL   BUN 11 8 - 27 mg/dL   Creatinine, Ser 8.96 (H) 0.57 - 1.00 mg/dL   eGFR 56 (L) >40 fO/fpw/8.26   BUN/Creatinine Ratio 11 (L) 12 - 28   Sodium 142 134 - 144 mmol/L   Potassium 4.2 3.5 - 5.2 mmol/L   Chloride 104 96 - 106 mmol/L   CO2 25 20 - 29 mmol/L   Calcium  9.0 8.7 - 10.3 mg/dL   Total Protein 5.9 (L) 6.0 - 8.5 g/dL   Albumin 3.7 (L) 3.8 - 4.8 g/dL   Globulin, Total 2.2 1.5 - 4.5 g/dL   Bilirubin Total 0.7 0.0 - 1.2 mg/dL   Alkaline Phosphatase 83 49 - 135 IU/L   AST 19 0 - 40 IU/L   ALT 8 0 - 32 IU/L  Basic metabolic panel     Status: Abnormal   Collection Time: 02/13/24 10:23 AM  Result Value Ref Range   Sodium 138 135 - 145 mmol/L   Potassium 3.7 3.5 - 5.1 mmol/L   Chloride 103 98 - 111 mmol/L   CO2 24 22 - 32 mmol/L   Glucose, Bld 125 (H) 70 - 99 mg/dL    Comment: Glucose reference range applies only to samples taken after fasting for at least 8 hours.   BUN 12 8 - 23 mg/dL  Creatinine, Ser 0.96 0.44 - 1.00 mg/dL   Calcium  8.4 (L) 8.9 - 10.3 mg/dL   GFR, Estimated >39 >39 mL/min    Comment: (NOTE) Calculated using the CKD-EPI Creatinine Equation (2021)    Anion gap 11 5 - 15    Comment: Performed at [facility], [address] [place], [place] [address]  CBC     Status: None   Collection Time: 02/13/24 10:23 AM  Result Value Ref Range   WBC 6.2 4.0 - 10.5 K/uL   RBC 4.95 3.87 - 5.11 MIL/uL   Hemoglobin 13.7 12.0 - 15.0 g/dL   HCT 58.0 63.9 - 53.9 %   MCV 84.6 80.0 - 100.0 fL   MCH 27.7 26.0 - 34.0 pg   MCHC 32.7 30.0 - 36.0 g/dL   RDW 86.3 88.4 - 84.4 %   Platelets 248 150 - 400 K/uL   nRBC 0.0 0.0 - 0.2 %    Comment: Performed at [facility], [address], [place], [place] [address]  BNP (Order if Patient has history of Heart Failure)     Status: Abnormal   Collection Time: 02/13/24 10:23 AM  Result Value Ref Range   B  Natriuretic Peptide 321.5 (H) 0.0 - 100.0 pg/mL    Comment: Performed at [facility], [address], [place], [place] [address]  Troponin I (High Sensitivity)     Status: Abnormal   Collection Time: 02/13/24 10:23 AM  Result Value Ref Range   Troponin I (High Sensitivity) 18 (H) <18 ng/L    Comment: (NOTE) Elevated high sensitivity troponin I (hsTnI) values and significant  changes across serial measurements Laible suggest ACS but many other  chronic and acute conditions are known to elevate hsTnI results.  Refer to the Links section for chest pain algorithms and additional  guidance. Performed at [facility], [address] [place], [place] [address]   Resp panel by RT-PCR (RSV, Flu A&B, Covid) Anterior Nasal Swab     Status: None   Collection Time: 02/13/24 12:54 PM   Specimen: Anterior Nasal Swab  Result Value Ref Range   SARS Coronavirus 2 by RT PCR NEGATIVE NEGATIVE    Comment: (NOTE) SARS-CoV-2 target nucleic acids are NOT DETECTED.  The SARS-CoV-2 RNA is generally detectable in upper respiratory specimens during the acute phase of infection. The lowest concentration of SARS-CoV-2 viral copies this assay can detect is 138 copies/mL. A negative result does not preclude SARS-Cov-2 infection and should not be used as the sole basis for treatment or other patient management decisions. A negative result [place] occur with  improper specimen collection/handling, submission of specimen other than nasopharyngeal swab, presence of viral mutation(s) within the areas targeted by this assay, and inadequate number of viral copies(<138 copies/mL). A negative result must be combined with clinical observations, patient history, and epidemiological information. The expected result is Negative.  Fact Sheet for Patients:  bloggercourse.com  Fact Sheet for Healthcare Providers:  seriousbroker.it  This test is no t yet  approved or cleared by the [place]  FDA and  has been authorized for detection and/or diagnosis of SARS-CoV-2 by FDA under an Emergency Use Authorization (EUA). This EUA will remain  in effect (meaning this test can be used) for the duration of the COVID-19 declaration under Section 564(b)(1) of the Act, 21 U.S.C.section 360bbb-3(b)(1), unless the authorization is terminated  or revoked sooner.       Influenza A by PCR NEGATIVE NEGATIVE   Influenza B by  PCR NEGATIVE NEGATIVE    Comment: (NOTE) The Xpert Xpress SARS-CoV-2/FLU/RSV plus assay is intended as an aid in the diagnosis of influenza from Nasopharyngeal swab specimens and should not be used as a sole basis for treatment. Nasal washings and aspirates are unacceptable for Xpert Xpress SARS-CoV-2/FLU/RSV testing.  Fact Sheet for Patients: bloggercourse.com  Fact Sheet for Healthcare Providers: seriousbroker.it  This test is not yet approved or cleared by the [place]  FDA and has been authorized for detection and/or diagnosis of SARS-CoV-2 by FDA under an Emergency Use Authorization (EUA). This EUA will remain in effect (meaning this test can be used) for the duration of the COVID-19 declaration under Section 564(b)(1) of the Act, 21 U.S.C. section 360bbb-3(b)(1), unless the authorization is terminated or revoked.     Resp Syncytial Virus by PCR NEGATIVE NEGATIVE    Comment: (NOTE) Fact Sheet for Patients: bloggercourse.com  Fact Sheet for Healthcare Providers: seriousbroker.it  This test is not yet approved or cleared by the [place]  FDA and has been authorized for detection and/or diagnosis of SARS-CoV-2 by FDA under an Emergency Use Authorization (EUA). This EUA will remain in effect (meaning this test can be used) for the duration of the COVID-19 declaration under Section 564(b)(1) of the Act, 21  U.S.C. section 360bbb-3(b)(1), unless the authorization is terminated or revoked.  Performed at [facility], [address] [place], [place] [address]   Troponin I (High Sensitivity)     Status: Abnormal   Collection Time: 02/13/24 12:54 PM  Result Value Ref Range   Troponin I (High Sensitivity) 19 (H) <18 ng/L    Comment: (NOTE) Elevated high sensitivity troponin I (hsTnI) values and significant  changes across serial measurements Genet suggest ACS but many other  chronic and acute conditions are known to elevate hsTnI results.  Refer to the Links section for chest pain algorithms and additional  guidance. Performed at [facility], [address] [place], [place] [address]   Respiratory (~20 pathogens) panel by PCR     Status: Abnormal   Collection Time: 02/13/24  7:49 PM   Specimen: Nasopharyngeal Swab; Respiratory  Result Value Ref Range   Adenovirus NOT DETECTED NOT DETECTED   Coronavirus 229E NOT DETECTED NOT DETECTED    Comment: (NOTE) The Coronavirus on the Respiratory Panel, DOES NOT test for the novel  Coronavirus (2019 nCoV)    Coronavirus HKU1 NOT DETECTED NOT DETECTED   Coronavirus NL63 NOT DETECTED NOT DETECTED   Coronavirus OC43 NOT DETECTED NOT DETECTED   Metapneumovirus NOT DETECTED NOT DETECTED   Rhinovirus / Enterovirus DETECTED (A) NOT DETECTED   Influenza A NOT DETECTED NOT DETECTED   Influenza B NOT DETECTED NOT DETECTED   Parainfluenza Virus 1 NOT DETECTED NOT DETECTED   Parainfluenza Virus 2 NOT DETECTED NOT DETECTED   Parainfluenza Virus 3 NOT DETECTED NOT DETECTED   Parainfluenza Virus 4 NOT DETECTED NOT DETECTED   Respiratory Syncytial Virus NOT DETECTED NOT DETECTED   Bordetella pertussis NOT DETECTED NOT DETECTED   Bordetella Parapertussis NOT DETECTED NOT DETECTED   Chlamydophila pneumoniae NOT DETECTED NOT DETECTED   Mycoplasma pneumoniae NOT DETECTED NOT DETECTED    Comment: Performed at [facility], [address] [place], [place] [address]  HIV Antibody (routine testing w rflx)     Status: None   Collection Time: 02/14/24  4:30 AM  Result Value Ref Range   HIV Screen 4th Generation wRfx Non Reactive Non Reactive    Comment: Performed  at [facility], [address], [place], [place] [address]  CBC     Status: None   Collection Time: 02/14/24  4:30 AM  Result Value Ref Range   WBC 6.6 4.0 - 10.5 K/uL   RBC 4.90 3.87 - 5.11 MIL/uL   Hemoglobin 13.6 12.0 - 15.0 g/dL   HCT 58.9 63.9 - 53.9 %   MCV 83.7 80.0 - 100.0 fL   MCH 27.8 26.0 - 34.0 pg   MCHC 33.2 30.0 - 36.0 g/dL   RDW 86.4 88.4 - 84.4 %   Platelets 231 150 - 400 K/uL   nRBC 0.0 0.0 - 0.2 %    Comment: Performed at [facility], [address] [place], [place] [address]  Basic metabolic panel     Status: Abnormal   Collection Time: 02/14/24  4:30 AM  Result Value Ref Range   Sodium 137 135 - 145 mmol/L   Potassium 3.5 3.5 - 5.1 mmol/L   Chloride 102 98 - 111 mmol/L   CO2 25 22 - 32 mmol/L   Glucose, Bld 195 (H) 70 - 99 mg/dL    Comment: Glucose reference range applies only to samples taken after fasting for at least 8 hours.   BUN 20 8 - 23 mg/dL   Creatinine, Ser 9.06 0.44 - 1.00 mg/dL   Calcium  8.5 (L) 8.9 - 10.3 mg/dL   GFR, Estimated >39 >39 mL/min    Comment: (NOTE) Calculated using the CKD-EPI Creatinine Equation (2021)    Anion gap 10 5 - 15    Comment: Performed at [facility], [address] [place], [place] [address]  Magnesium      Status: None   Collection Time: 02/14/24  4:30 AM  Result Value Ref Range   Magnesium  2.1 1.7 - 2.4 mg/dL    Comment: Performed at [facility], [address] [place], [place] [address]  CBC     Status: Abnormal   Collection Time: 02/15/24  5:48 AM  Result Value Ref Range   WBC 14.1 (H) 4.0 - 10.5 K/uL   RBC 4.99 3.87 - 5.11 MIL/uL   Hemoglobin 13.6 12.0 - 15.0 g/dL   HCT 56.3 63.9 - 53.9 %   MCV 87.4 80.0 - 100.0 fL   MCH 27.3 26.0 -  34.0 pg   MCHC 31.2 30.0 - 36.0 g/dL   RDW 86.3 88.4 - 84.4 %   Platelets 317 150 - 400 K/uL   nRBC 0.0 0.0 - 0.2 %    Comment: Performed at [facility], [address], [place], [place] [address]  Basic metabolic panel with GFR     Status: Abnormal   Collection Time: 02/15/24  5:48 AM  Result Value Ref Range   Sodium 138 135 - 145 mmol/L   Potassium 4.4 3.5 - 5.1 mmol/L   Chloride 103 98 - 111 mmol/L   CO2 26 22 - 32 mmol/L   Glucose, Bld 226 (H) 70 - 99 mg/dL    Comment: Glucose reference range applies only to samples taken after fasting for at least 8 hours.   BUN 29 (H) 8 - 23 mg/dL   Creatinine, Ser 8.71 (H) 0.44 - 1.00 mg/dL   Calcium  8.5 (L) 8.9 - 10.3 mg/dL   GFR, Estimated 43 (L) >60 mL/min    Comment: (NOTE) Calculated using the CKD-EPI Creatinine Equation (2021)    Anion gap 9 5 - 15    Comment: Performed at [facility], [address] [place], [place] [address]  POCT Urinalysis Dipstick [phone])     Status: Abnormal   Collection Time: 03/01/24  3:37 PM  Result Value Ref Range   Color, UA Yellow    Clarity, UA Clear    Glucose, UA Positive (A) Negative   Bilirubin, UA Negative    Ketones, UA Negative    Spec Grav, UA 1.015 1.010 - 1.025   Blood, UA Negative    pH, UA 5.5 5.0 - 8.0   Protein, UA Negative Negative   Urobilinogen, UA 0.2 0.2 or 1.0 E.U./dL   Nitrite, UA Negative    Leukocytes, UA Negative Negative   Appearance Clear    Odor No   Culture, Urine     Status: None   Collection Time: 03/01/24  4:04 PM   Specimen: Urine   UR  Result Value Ref Range   Urine Culture, Routine Final report    Organism ID, Bacteria Comment     Comment: Mixed urogenital flora 10,000-25,000 colony forming units per mL        Assessment & Plan:   Assessment & Plan Primary hypertension Mixed hyperlipidemia Prediabetes Class 3 severe obesity due to excess calories with serious comorbidity and body mass index (BMI) of 45.0 to 49.9 in adult  [facility]) Atrial fibrillation with slow ventricular response (HCC) Dizziness - Continue healthy diet and exercise as tolerated. - Continue medications as prescribed. - Check labs today  - Recommend FU with Cardiologist due to missed appointmnet Vitamin D  deficiency Hypothyroidism (acquired) B12 deficiency due to diet Other fatigue Malaise Chronic obstructive pulmonary disease, unspecified COPD type (HCC) - Check labs today - Supplementation recommended based off lab results and will notify patient at that time  - Check monospot and lyme panel. - Recommend ED if symptoms worsen, persist, new symptoms arise. - Empiric Doxycycline  twice day for 7 days. Myalgia - Check blood work today.    Patient to return in 1 week or sooner as needed.  Total time spent: 30 minutes  Oddis DELENA Cain, FNP  03/06/2024   This document [place] have been prepared by [facility] Voice Recognition software and as such Urick include unintentional dictation errors.

## 2024-03-06 NOTE — Transitions of Care (Post Inpatient/ED Visit) (Signed)
 Transition of Care week 3  Visit Note  03/06/2024  Name: Patricia Walker MRN: [id number]          DOB: Oct 01, 1945  Situation: Patient enrolled in [facility] 30-day program. Visit completed with Tilton Nepomuceno by telephone.   Background:   Initial Transition Care Management Follow-up Telephone Call Discharge Date and Diagnosis: 02/14/24, COPD Exacerbation   Past Medical History:  Diagnosis Date   Anxiety    Arthritis    Asthma    CHF (congestive heart failure) (HCC)    COPD exacerbation (HCC) 10/08/2020   Coronary artery disease    mild, nonobstructive   Depression    Dyspnea    on exertion   Dysrhythmia    Atrial Fibrillation   GERD (gastroesophageal reflux disease)    Heart murmur    Hyperlipidemia    Hypertension    Hypothyroidism    MI, old 2016   nonobstructive CAD by Cath   Morbid obesity (HCC)    Persistent atrial fibrillation (HCC)    Pneumonia 06/2018   and RSV   Psoriasis    Sleep apnea    compliant with CPAP   Thyroid  disease     Assessment: Patient Reported Symptoms: Cognitive Cognitive Status: Alert and oriented to person, place, and time, Normal speech and language skills      Neurological Neurological Review of Symptoms: No symptoms reported    HEENT HEENT Symptoms Reported: Other: (The patient is complaining of a runny nose)      Cardiovascular Cardiovascular Symptoms Reported: Fatigue, Dizziness, Lightheadness Does patient have uncontrolled Hypertension?: No Cardiovascular Management Strategies: Medication therapy, Adequate rest, Routine screening Cardiovascular Comment: The patient missed her Cardiology appointment due to hospitalization. She will reschedule  Respiratory Respiratory Symptoms Reported: Productive cough, Shortness of breath Additional Respiratory Details: The patient continues to state she is dizzy, weak and tired. She was a the PCP today and lab work was drawn for testing Respiratory Management Strategies: Routine screening,  Medication therapy, Adequate rest  Endocrine Endocrine Symptoms Reported: No symptoms reported Is patient diabetic?: No    Gastrointestinal Gastrointestinal Symptoms Reported: No symptoms reported      Genitourinary Genitourinary Symptoms Reported: No symptoms reported Genitourinary Management Strategies: Medication therapy  Integumentary Integumentary Symptoms Reported: No symptoms reported    Musculoskeletal Musculoskelatal Symptoms Reviewed: Weakness        Psychosocial Psychosocial Symptoms Reported: No symptoms reported         There were no vitals filed for this visit.    Medications Reviewed Today     Reviewed by Moises Reusing, RN (Case Manager) on 03/06/24 at 1419  Med List Status: <None>   Medication Order Taking? Sig Documenting Provider Last Dose Status Informant  acetaminophen  (TYLENOL ) 325 MG tablet [id number]  Take 2 tablets (650 mg total) by mouth every 6 (six) hours as needed for mild pain (pain score 1-3) or moderate pain (pain score 4-6). Jens Durand, MD  Active Self, Pharmacy Records  albuterol  (VENTOLIN  HFA) 108 [phone] Base) MCG/ACT inhaler [id number]  Inhale 1-2 puffs into the lungs every 4 (four) hours as needed for wheezing or shortness of breath. Lenon Marien CROME, MD  Active   atorvastatin  (LIPITOR) 40 MG tablet [id number]  Take 40 mg by mouth daily. [provider]  Active   busPIRone  (BUSPAR ) 7.5 MG tablet [id number]  Take 7.5 mg by mouth 2 (two) times daily. [provider]  Active Self, Pharmacy Records  dapagliflozin  propanediol (FARXIGA ) 10 MG TABS tablet [id number]  Take 1 tablet (  10 mg total) by mouth daily before breakfast. Fernand Denyse LABOR, MD  Active Self, Pharmacy Records  doxycycline  (VIBRA -TABS) 100 MG tablet [id number]  Take 1 tablet (100 mg total) by mouth 2 (two) times daily for 7 days. [name], FNP  Active   ELIQUIS  5 MG TABS tablet [id number]  TAKE 1 TABLET BY MOUTH TWICE A DAY [name], FNP  Active  Self, Pharmacy Records  enalapril  (VASOTEC ) 2.5 MG tablet [id number]  Take 1 tablet (2.5 mg total) by mouth daily. Fernand Denyse LABOR, MD  Active Self, Pharmacy Records  esomeprazole (NEXIUM) 40 MG capsule [id number]  TAKE 1 CAPSULE BY MOUTH EVERY DAY IN THE MORNING [name], FNP  Active Self, Pharmacy Records  fluticasone  (FLONASE ) 50 MCG/ACT nasal spray [id number]  Place 1 spray into both nostrils daily.  Patient taking differently: Place 1 spray into both nostrils daily as needed.   [name], FNP  Active Self, Pharmacy Records  furosemide  (LASIX ) 20 MG tablet [id number]  Take 1 tablet (20 mg total) by mouth daily. Fernand Denyse LABOR, MD  Active Self, Pharmacy Records  ipratropium-albuterol  (DUONEB) 0.5-2.5 (3) MG/3ML SOLN [id number]  Take 3 mLs by nebulization every 6 (six) hours as needed. [name], FNP  Active Self, Pharmacy Records  meclizine  (ANTIVERT ) 12.5 MG tablet [id number]  Take 1 tablet (12.5 mg total) by mouth 2 (two) times daily as needed for dizziness. Cox, Amy N, DO  Active Self, Pharmacy Records  montelukast  (SINGULAIR ) 10 MG tablet [id number]  Take 1 tablet (10 mg total) by mouth at bedtime. [name], FNP  Active   nitrofurantoin , macrocrystal-monohydrate, (MACROBID ) 100 MG capsule [id number]  Take 1 capsule (100 mg total) by mouth 2 (two) times daily for 5 days.  Patient not taking: Reported on 03/06/2024   [name], FNP  Active   nystatin  powder [id number]  Apply 1 Application topically 3 (three) times daily as needed. Irritation under breast and groin area [name], FNP  Active Self, Pharmacy Records  predniSONE  (DELTASONE ) 20 MG tablet [id number]  Take 2 tablets (40 mg total) by mouth daily with breakfast. [name], FNP  Active   TALTZ 80 MG/ML SOAJ [id number]  Inject 80 mg into the skin every 28 (twenty-eight) days. [provider]  Active Self, Pharmacy Records  Tiotropium Bromide  (SPIRIVA  RESPIMAT) 2.5 MCG/ACT AERS  [id number]  Inhale 2 puffs into the lungs daily. Lenon Marien CROME, MD  Active   Vibegron  (GEMTESA ) 75 MG TABS [id number]  Take 1 tablet (75 mg total) by mouth daily. [name], FNP  Active Self, Pharmacy Records            Recommendation:   PCP Follow-up Continue Current Plan of Care  Follow Up Plan:   Telephone follow-up in 1 week  Medford Balboa, BSN, RN [place]  VBCI - [facility] RN Care Manager [phone]

## 2024-03-07 LAB — LYME DISEASE SEROLOGY W/REFLEX: Lyme Total Antibody EIA: NEGATIVE

## 2024-03-07 LAB — IRON,TIBC AND FERRITIN PANEL
Ferritin: 184 ng/mL — ABNORMAL HIGH (ref 15–150)
Iron Saturation: 32 % (ref 15–55)
Iron: 103 ug/dL (ref 27–139)
Total Iron Binding Capacity: 322 ug/dL (ref 250–450)
UIBC: 219 ug/dL (ref 118–369)

## 2024-03-08 LAB — CBC WITH DIFFERENTIAL/PLATELET
Basophils Absolute: 0.1 x10E3/uL (ref 0.0–0.2)
Basos: 1 %
EOS (ABSOLUTE): 0.1 x10E3/uL (ref 0.0–0.4)
Eos: 1 %
Hematocrit: 49.1 % — ABNORMAL HIGH (ref 34.0–46.6)
Hemoglobin: 16 g/dL — ABNORMAL HIGH (ref 11.1–15.9)
Immature Grans (Abs): 0.1 x10E3/uL (ref 0.0–0.1)
Immature Granulocytes: 1 %
Lymphocytes Absolute: 1.9 x10E3/uL (ref 0.7–3.1)
Lymphs: 20 %
MCH: 28.7 pg (ref 26.6–33.0)
MCHC: 32.6 g/dL (ref 31.5–35.7)
MCV: 88 fL (ref 79–97)
Monocytes Absolute: 0.8 x10E3/uL (ref 0.1–0.9)
Monocytes: 8 %
Neutrophils Absolute: 6.8 x10E3/uL (ref 1.4–7.0)
Neutrophils: 69 %
Platelets: 317 x10E3/uL (ref 150–450)
RBC: 5.57 x10E6/uL — ABNORMAL HIGH (ref 3.77–5.28)
RDW: 14.3 % (ref 11.7–15.4)
WBC: 9.8 x10E3/uL (ref 3.4–10.8)

## 2024-03-08 LAB — CMP14+EGFR
ALT: 15 IU/L (ref 0–32)
AST: 19 IU/L (ref 0–40)
Albumin: 4.2 g/dL (ref 3.8–4.8)
Alkaline Phosphatase: 96 IU/L (ref 49–135)
BUN/Creatinine Ratio: 18 (ref 12–28)
BUN: 27 mg/dL (ref 8–27)
Bilirubin Total: 1.2 mg/dL (ref 0.0–1.2)
CO2: 23 mmol/L (ref 20–29)
Calcium: 9.5 mg/dL (ref 8.7–10.3)
Chloride: 98 mmol/L (ref 96–106)
Creatinine, Ser: 1.52 mg/dL — ABNORMAL HIGH (ref 0.57–1.00)
Globulin, Total: 2.3 g/dL (ref 1.5–4.5)
Glucose: 104 mg/dL — ABNORMAL HIGH (ref 70–99)
Potassium: 4.2 mmol/L (ref 3.5–5.2)
Sodium: 138 mmol/L (ref 134–144)
Total Protein: 6.5 g/dL (ref 6.0–8.5)
eGFR: 35 mL/min/1.73 — ABNORMAL LOW (ref >59)

## 2024-03-08 LAB — TSH: TSH: 3.49 u[IU]/mL (ref 0.450–4.500)

## 2024-03-08 LAB — MONONUCLEOSIS SCREEN: Mono Screen: POSITIVE — AB

## 2024-03-08 LAB — HEMOGLOBIN A1C
Est. average glucose Bld gHb Est-mCnc: 131 mg/dL
Hgb A1c MFr Bld: 6.2 % — ABNORMAL HIGH (ref 4.8–5.6)

## 2024-03-08 LAB — LIPID PANEL
Chol/HDL Ratio: 2.8 ratio (ref 0.0–4.4)
Cholesterol, Total: 131 mg/dL (ref 100–199)
HDL: 46 mg/dL (ref >39)
LDL Chol Calc (NIH): 56 mg/dL (ref 0–99)
Triglycerides: 175 mg/dL — ABNORMAL HIGH (ref 0–149)
VLDL Cholesterol Cal: 29 mg/dL (ref 5–40)

## 2024-03-08 LAB — VITAMIN D 25 HYDROXY (VIT D DEFICIENCY, FRACTURES): Vit D, 25-Hydroxy: 57.5 ng/mL (ref 30.0–100.0)

## 2024-03-08 LAB — VITAMIN B12: Vitamin B-12: 612 pg/mL (ref 232–1245)

## 2024-03-11 ENCOUNTER — Encounter: Payer: Self-pay | Admitting: Family

## 2024-03-11 ENCOUNTER — Ambulatory Visit: Admitting: Family

## 2024-03-11 ENCOUNTER — Ambulatory Visit: Payer: Self-pay

## 2024-03-11 VITALS — BP 118/82 | HR 80 | Ht 65.0 in | Wt 291.4 lb

## 2024-03-11 DIAGNOSIS — B2790 Infectious mononucleosis, unspecified without complication: Secondary | ICD-10-CM | POA: Diagnosis not present

## 2024-03-11 DIAGNOSIS — Z6841 Body Mass Index (BMI) 40.0 and over, adult: Secondary | ICD-10-CM | POA: Diagnosis not present

## 2024-03-11 DIAGNOSIS — Z0130 Encounter for examination of blood pressure without abnormal findings: Secondary | ICD-10-CM

## 2024-03-11 NOTE — Progress Notes (Signed)
 Established Patient Office Visit  Subjective:  Patient ID: Patricia Walker, female    DOB: 02-17-1946  Age: 78 y.o. MRN: [id number]  Chief Complaint  Patient presents with   Follow-up    1 week follow up    Patient is here today for her 1 week follow up.  She has been feeling slightly improved since last appointment.   She does have additional concerns to discuss today.  She has been feeling better, and she has been having significant additional stressors due to family issues.  She did test positive for Mono after the labs we did last week.   Labs were done prior to appointment, will discuss in detail today.  She needs refills.   I have reviewed her active problem list, medication list, allergies, notes from last encounter, lab results for her appointment today.      No other concerns at this time.   Past Medical History:  Diagnosis Date   Anxiety    Arthritis    Asthma    CHF (congestive heart failure) (HCC)    COPD exacerbation (HCC) 10/08/2020   Coronary artery disease    mild, nonobstructive   Depression    Dyspnea    on exertion   Dysrhythmia    Atrial Fibrillation   GERD (gastroesophageal reflux disease)    Heart murmur    Hyperlipidemia    Hypertension    Hypothyroidism    MI, old 2016   nonobstructive CAD by Cath   Morbid obesity (HCC)    Persistent atrial fibrillation (HCC)    Pneumonia 06/2018   and RSV   Psoriasis    Sleep apnea    compliant with CPAP   Thyroid  disease     Past Surgical History:  Procedure Laterality Date   ARTERY BIOPSY Right 07/21/2017   Procedure: BIOPSY TEMPORAL ARTERY;  Surgeon: Jama Cordella MATSU, MD;  Location: [facility];  Service: Vascular;  Laterality: Right;   CARDIAC CATHETERIZATION     CARDIOVERSION Right 09/01/2016   Procedure: Cardioversion;  Surgeon: Fernand Denyse LABOR, MD;  Location: [facility];  Service: Cardiovascular;  Laterality: Right;   CARDIOVERSION N/A 09/09/2016   Procedure: Cardioversion;  Surgeon: Fernand Denyse LABOR, MD;  Location: [facility];  Service: Cardiovascular;  Laterality: N/A;   CATARACT EXTRACTION W/PHACO Right 03/26/2019   Procedure: CATARACT EXTRACTION PHACO AND INTRAOCULAR LENS PLACEMENT (IOC) RIGHT 6.45, 00:39.9;  Surgeon: Jaye Fallow, MD;  Location: [facility];  Service: Ophthalmology;  Laterality: Right;   CATARACT EXTRACTION W/PHACO Left 04/16/2019   Procedure: CATARACT EXTRACTION PHACO AND INTRAOCULAR LENS PLACEMENT (IOC) LEFT;   3.14, 00:24.9;  Surgeon: Jaye Fallow, MD;  Location: [facility];  Service: Ophthalmology;  Laterality: Left;  sleep apnea-CPAP   COLONOSCOPY WITH PROPOFOL  N/A 08/28/2019   Procedure: COLONOSCOPY WITH PROPOFOL ;  Surgeon: Toledo, Ladell POUR, MD;  Location: [facility];  Service: Gastroenterology;  Laterality: N/A;   ELECTROPHYSIOLOGIC STUDY N/A 02/01/2016   Procedure: CARDIOVERSION;  Surgeon: Denyse LABOR Fernand, MD;  Location: [facility];  Service: Cardiovascular;  Laterality: N/A;   ESOPHAGEAL DILATION     ESOPHAGOGASTRODUODENOSCOPY (EGD) WITH PROPOFOL  N/A 02/06/2017   Procedure: ESOPHAGOGASTRODUODENOSCOPY (EGD) WITH PROPOFOL ;  Surgeon: Therisa Bi, MD;  Location: [facility];  Service: Gastroenterology;  Laterality: N/A;   ESOPHAGOGASTRODUODENOSCOPY (EGD) WITH PROPOFOL  N/A 08/28/2019   Procedure: ESOPHAGOGASTRODUODENOSCOPY (EGD) WITH PROPOFOL ;  Surgeon: Toledo, Ladell POUR, MD;  Location: [facility];  Service: Gastroenterology;  Laterality: N/A;   EUS N/A 02/16/2017   Procedure:  FULL UPPER ENDOSCOPIC ULTRASOUND (EUS) RADIAL;  Surgeon: Elta Fonda SQUIBB, MD;  Location: [facility];  Service: Gastroenterology;  Laterality: N/A;   LEFT HEART CATH AND CORONARY ANGIOGRAPHY N/A 09/08/2016   Procedure: Left Heart Cath and Coronary Angiography;  Surgeon: Fernand Denyse LABOR, MD;  Location: [facility];  Service: Cardiovascular;  Laterality: N/A;   US  ECHOCARDIOGRAPHY      Social History   Socioeconomic History   Marital status:  Divorced    Spouse name: Not on file   Number of children: Not on file   Years of education: Not on file   Highest education level: Not on file  Occupational History   Not on file  Tobacco Use   Smoking status: Former    Current packs/day: 0.00    Types: Cigarettes    Quit date: 02/01/2013    Years since quitting: 11.1   Smokeless tobacco: Never  Vaping Use   Vaping status: Never Used  Substance and Sexual Activity   Alcohol use: No   Drug use: No   Sexual activity: Not on file  Other Topics Concern   Not on file  Social History Narrative   Lives in [place] with multiple family members   unemployed   Social Drivers of Health   Financial Resource Strain: Low Risk  (10/23/2023)   Received from [facility]   Overall Financial Resource Strain (CARDIA)    Difficulty of Paying Living Expenses: Not very hard  Food Insecurity: No Food Insecurity (02/19/2024)   Hunger Vital Sign    Worried About Running Out of Food in the Last Year: Never true    Ran Out of Food in the Last Year: Never true  Transportation Needs: No Transportation Needs (02/19/2024)   PRAPARE - Administrator, Civil Service (Medical): No    Lack of Transportation (Non-Medical): No  Physical Activity: Not on file  Stress: Not on file  Social Connections: Moderately Integrated (02/13/2024)   Social Connection and Isolation Panel    Frequency of Communication with Friends and Family: More than three times a week    Frequency of Social Gatherings with Friends and Family: More than three times a week    Attends Religious Services: 1 to 4 times per year    Active Member of [employer] or Organizations: Yes    Attends Banker Meetings: 1 to 4 times per year    Marital Status: Divorced  Catering Manager Violence: Not At Risk (02/19/2024)   Humiliation, Afraid, Rape, and Kick questionnaire    Fear of Current or Ex-Partner: No    Emotionally Abused: No    Physically Abused: No     Sexually Abused: No    Family History  Problem Relation Age of Onset   Breast cancer Paternal Aunt    Other Father    Heart attack Father     Allergies  Allergen Reactions   Codeine Hives, Itching and Rash   Other Itching and Other (See Comments)    States antibiotic in the past caused itching but can not remember name    Review of Systems  Constitutional:  Positive for malaise/fatigue.  Respiratory:  Positive for cough and shortness of breath.   All other systems reviewed and are negative.      Objective:   BP 118/82   Pulse 80   Ht 5' 5 (1.651 m)   Wt 291 lb 6.4 oz (132.2 kg)   SpO2 97%   BMI 48.49  kg/m   Vitals:   03/11/24 1533  BP: 118/82  Pulse: 80  Height: 5' 5 (1.651 m)  Weight: 291 lb 6.4 oz (132.2 kg)  SpO2: 97%  BMI (Calculated): 48.49    Physical Exam Vitals and nursing note reviewed.  Constitutional:      Appearance: Normal appearance. She is obese.  HENT:     Head: Normocephalic.  Eyes:     Extraocular Movements: Extraocular movements intact.     Conjunctiva/sclera: Conjunctivae normal.     Pupils: Pupils are equal, round, and reactive to light.  Cardiovascular:     Rate and Rhythm: Normal rate and regular rhythm.     Pulses: Normal pulses.  Pulmonary:     Effort: Pulmonary effort is normal.     Breath sounds: Wheezing present.  Musculoskeletal:        General: Normal range of motion.  Neurological:     General: No focal deficit present.     Mental Status: She is alert and oriented to person, place, and time. Mental status is at baseline.  Psychiatric:        Mood and Affect: Mood normal.        Behavior: Behavior normal.        Thought Content: Thought content normal.        Judgment: Judgment normal.      No results found for any visits on 03/11/24.  Recent Results (from the past 2160 hours)  CMP14+EGFR     Status: Abnormal   Collection Time: 12/22/23  1:59 PM  Result Value Ref Range   Glucose 88 70 - 99 mg/dL    BUN 14 8 - 27 mg/dL   Creatinine, Ser 8.93 (H) 0.57 - 1.00 mg/dL   eGFR 54 (L) >40 fO/fpw/8.26   BUN/Creatinine Ratio 13 12 - 28   Sodium 141 134 - 144 mmol/L   Potassium 4.0 3.5 - 5.2 mmol/L   Chloride 100 96 - 106 mmol/L   CO2 25 20 - 29 mmol/L   Calcium  9.0 8.7 - 10.3 mg/dL   Total Protein 6.4 6.0 - 8.5 g/dL   Albumin 4.1 3.8 - 4.8 g/dL   Globulin, Total 2.3 1.5 - 4.5 g/dL   Bilirubin Total 0.9 0.0 - 1.2 mg/dL   Alkaline Phosphatase 87 44 - 121 IU/L    Comment: **Effective December 25, 2023 Alkaline Phosphatase**   reference interval will be changing to:              Age                Female          Female           0 -  5 days         47 - 127       47 - 127           6 - 10 days         29 - 242       29 - 242          11 - 20 days        109 - 357      109 - 357          21 - 30 days         94 - 494       94 - 494           1 -  2 months      149 - 539      149 - 539           3 -  6 months      131 - 452      131 - 452           7 - 11 months      117 - 401      117 - 401   12 months -  6 years       158 - 369      158 - 369           7 - 12 years       150 - 409      150 - 409               13 years       156 - 435       78 - 227               14 years       114 - 375       64 - 161               15 years        88 - 279       56 - 134               16 years        74 - 207       51 - 121               17 years        63 - 161       47 - 113          18 - 20 years        51 - 125       42 - 106          21 - 50 years         47 - 123       41 - 116          51 - 80 years        49 - 135       51 - 125              >80 years        48 - 129       48 - 129    AST 21 0 - 40 IU/L   ALT 13 0 - 32 IU/L  Lipid panel     Status: Abnormal   Collection Time: 12/22/23  1:59 PM  Result Value Ref Range   Cholesterol, Total 209 (H) 100 - 199 mg/dL   Triglycerides 881 0 - 149 mg/dL   HDL 39 (L) >60 mg/dL   VLDL Cholesterol Cal 21 5 - 40 mg/dL   LDL Chol Calc (NIH) 850 (H) 0 -  99 mg/dL   Chol/HDL Ratio 5.4 (H) 0.0 - 4.4 ratio    Comment:                                   T. Chol/HDL Ratio  Men  Women                               1/2 Avg.Risk  3.4    3.3                                   Avg.Risk  5.0    4.4                                2X Avg.Risk  9.6    7.1                                3X Avg.Risk 23.4   11.0   Iron, TIBC and Ferritin Panel     Status: None   Collection Time: 12/22/23  1:59 PM  Result Value Ref Range   Total Iron Binding Capacity 336 250 - 450 ug/dL   UIBC 716 881 - 630 ug/dL   Iron 53 27 - 860 ug/dL   Iron Saturation 16 15 - 55 %   Ferritin 93 15 - 150 ng/mL  VITAMIN D  25 Hydroxy (Vit-D Deficiency, Fractures)     Status: Abnormal   Collection Time: 12/22/23  1:59 PM  Result Value Ref Range   Vit D, 25-Hydroxy 18.5 (L) 30.0 - 100.0 ng/mL    Comment: Vitamin D  deficiency has been defined by the Institute of Medicine and an Endocrine Society practice guideline as a level of serum 25-OH vitamin D  less than 20 ng/mL (1,2). The Endocrine Society went on to further define vitamin D  insufficiency as a level between 21 and 29 ng/mL (2). 1. IOM (Institute of Medicine). 2010. Dietary reference    intakes for calcium  and D. [place]: The    [employer]. 2. Holick MF, Binkley [place], Bischoff-Ferrari HA, et al.    Evaluation, treatment, and prevention of vitamin D     deficiency: an Endocrine Society clinical practice    guideline. JCEM. 2011 Jul; 96(7):1911-30.   Vitamin B12     Status: None   Collection Time: 12/22/23  1:59 PM  Result Value Ref Range   Vitamin B-12 522 232 - 1,245 pg/mL  CBC with Diff     Status: None   Collection Time: 12/22/23  1:59 PM  Result Value Ref Range   WBC 5.7 3.4 - 10.8 x10E3/uL   RBC 4.95 3.77 - 5.28 x10E6/uL   Hemoglobin 14.1 11.1 - 15.9 g/dL   Hematocrit 56.9 65.9 - 46.6 %   MCV 87 79 - 97 fL   MCH 28.5 26.6 - 33.0 pg   MCHC 32.8 31.5 - 35.7  g/dL   RDW 85.7 88.2 - 84.5 %   Platelets 298 150 - 450 x10E3/uL   Neutrophils 65 Not Estab. %   Lymphs 22 Not Estab. %   Monocytes 9 Not Estab. %   Eos 2 Not Estab. %   Basos 1 Not Estab. %   Neutrophils Absolute 3.7 1.4 - 7.0 x10E3/uL   Lymphocytes Absolute 1.3 0.7 - 3.1 x10E3/uL   Monocytes Absolute 0.5 0.1 - 0.9 x10E3/uL   EOS (ABSOLUTE) 0.1 0.0 - 0.4 x10E3/uL   Basophils Absolute 0.1 0.0 - 0.2 x10E3/uL   Immature Granulocytes 0 Not Estab. %   Immature  Grans (Abs) 0.0 0.0 - 0.1 x10E3/uL  Hemoglobin A1c     Status: Abnormal   Collection Time: 12/22/23  1:59 PM  Result Value Ref Range   Hgb A1c MFr Bld 6.0 (H) 4.8 - 5.6 %    Comment:          Prediabetes: 5.7 - 6.4          Diabetes: >6.4          Glycemic control for adults with diabetes: <7.0    Est. average glucose Bld gHb Est-mCnc 126 mg/dL  [id number]     Status: None   Collection Time: 12/22/23  1:59 PM  Result Value Ref Range   TSH 2.070 0.450 - 4.500 uIU/mL   T3, Free 3.0 2.0 - 4.4 pg/mL   Free T4 1.07 0.82 - 1.77 ng/dL  Magnesium      Status: None   Collection Time: 12/22/23  1:59 PM  Result Value Ref Range   Magnesium  1.9 1.6 - 2.3 mg/dL  Specimen status report     Status: None   Collection Time: 12/22/23  1:59 PM  Result Value Ref Range   specimen status report Comment     Comment: Written Authorization Written Authorization Written Authorization Received. Authorization received from Rosina Mae for [employer] on 12-26-2023 Logged by Joesph Ion   Urinalysis, Routine w reflex microscopic -Urine, Clean Catch     Status: Abnormal   Collection Time: 01/22/24  5:30 PM  Result Value Ref Range   Color, Urine YELLOW (A) YELLOW   APPearance CLEAR (A) CLEAR   Specific Gravity, Urine 1.016 1.005 - 1.030   pH 5.0 5.0 - 8.0   Glucose, UA NEGATIVE NEGATIVE mg/dL   Hgb urine dipstick NEGATIVE NEGATIVE   Bilirubin Urine NEGATIVE NEGATIVE   Ketones, ur NEGATIVE NEGATIVE mg/dL   Protein, ur NEGATIVE  NEGATIVE mg/dL   Nitrite NEGATIVE NEGATIVE   Leukocytes,Ua NEGATIVE NEGATIVE    Comment: Performed at [facility], [address] [place], [place] [address]  Comprehensive metabolic panel     Status: Abnormal   Collection Time: 01/22/24  5:31 PM  Result Value Ref Range   Sodium 141 135 - 145 mmol/L   Potassium 3.8 3.5 - 5.1 mmol/L   Chloride 103 98 - 111 mmol/L   CO2 26 22 - 32 mmol/L   Glucose, Bld 76 70 - 99 mg/dL    Comment: Glucose reference range applies only to samples taken after fasting for at least 8 hours.   BUN 20 8 - 23 mg/dL   Creatinine, Ser 8.84 (H) 0.44 - 1.00 mg/dL   Calcium  8.5 (L) 8.9 - 10.3 mg/dL   Total Protein 6.6 6.5 - 8.1 g/dL   Albumin 3.3 (L) 3.5 - 5.0 g/dL   AST 18 15 - 41 U/L   ALT 13 0 - 44 U/L   Alkaline Phosphatase 60 38 - 126 U/L   Total Bilirubin 0.9 0.0 - 1.2 mg/dL   GFR, Estimated 49 (L) >60 mL/min    Comment: (NOTE) Calculated using the CKD-EPI Creatinine Equation (2021)    Anion gap 12 5 - 15    Comment: Performed at [facility], [address] [place], [place] [address]  CBC     Status: None   Collection Time: 01/22/24  5:31 PM  Result Value Ref Range   WBC 7.5 4.0 - 10.5 K/uL   RBC 5.09 3.87 - 5.11 MIL/uL   Hemoglobin 14.2 12.0 - 15.0 g/dL  HCT 43.2 36.0 - 46.0 %   MCV 84.9 80.0 - 100.0 fL   MCH 27.9 26.0 - 34.0 pg   MCHC 32.9 30.0 - 36.0 g/dL   RDW 86.3 88.4 - 84.4 %   Platelets 304 150 - 400 K/uL   nRBC 0.0 0.0 - 0.2 %    Comment: Performed at [facility], [address] [place], [place] [address]  Brain natriuretic peptide     Status: Abnormal   Collection Time: 01/22/24  5:31 PM  Result Value Ref Range   B Natriuretic Peptide 144.7 (H) 0.0 - 100.0 pg/mL    Comment: Performed at [facility], [address] [place], [place] [address]  Troponin I (High Sensitivity)     Status: None   Collection Time: 01/22/24  5:31 PM  Result Value Ref Range   Troponin I (High Sensitivity) 11 <18  ng/L    Comment: (NOTE) Elevated high sensitivity troponin I (hsTnI) values and significant  changes across serial measurements Ehrsam suggest ACS but many other  chronic and acute conditions are known to elevate hsTnI results.  Refer to the Links section for chest pain algorithms and additional  guidance. Performed at [facility], [address] [place], [place] [address]   Resp panel by RT-PCR (RSV, Flu A&B, Covid) Anterior Nasal Swab     Status: None   Collection Time: 01/22/24 11:57 PM   Specimen: Anterior Nasal Swab  Result Value Ref Range   SARS Coronavirus 2 by RT PCR NEGATIVE NEGATIVE    Comment: (NOTE) SARS-CoV-2 target nucleic acids are NOT DETECTED.  The SARS-CoV-2 RNA is generally detectable in upper respiratory specimens during the acute phase of infection. The lowest concentration of SARS-CoV-2 viral copies this assay can detect is 138 copies/mL. A negative result does not preclude SARS-Cov-2 infection and should not be used as the sole basis for treatment or other patient management decisions. A negative result Murtaugh occur with  improper specimen collection/handling, submission of specimen other than nasopharyngeal swab, presence of viral mutation(s) within the areas targeted by this assay, and inadequate number of viral copies(<138 copies/mL). A negative result must be combined with clinical observations, patient history, and epidemiological information. The expected result is Negative.  Fact Sheet for Patients:  bloggercourse.com  Fact Sheet for Healthcare Providers:  seriousbroker.it  This test is no t yet approved or cleared by the [place]  FDA and  has been authorized for detection and/or diagnosis of SARS-CoV-2 by FDA under an Emergency Use Authorization (EUA). This EUA will remain  in effect (meaning this test can be used) for the duration of the COVID-19 declaration under Section 564(b)(1)  of the Act, 21 U.S.C.section 360bbb-3(b)(1), unless the authorization is terminated  or revoked sooner.       Influenza A by PCR NEGATIVE NEGATIVE   Influenza B by PCR NEGATIVE NEGATIVE    Comment: (NOTE) The Xpert Xpress SARS-CoV-2/FLU/RSV plus assay is intended as an aid in the diagnosis of influenza from Nasopharyngeal swab specimens and should not be used as a sole basis for treatment. Nasal washings and aspirates are unacceptable for Xpert Xpress SARS-CoV-2/FLU/RSV testing.  Fact Sheet for Patients: bloggercourse.com  Fact Sheet for Healthcare Providers: seriousbroker.it  This test is not yet approved or cleared by the [place]  FDA and has been authorized for detection and/or diagnosis of SARS-CoV-2 by FDA under an Emergency Use Authorization (EUA). This EUA will remain in effect (meaning this test can be used) for the duration of  the COVID-19 declaration under Section 564(b)(1) of the Act, 21 U.S.C. section 360bbb-3(b)(1), unless the authorization is terminated or revoked.     Resp Syncytial Virus by PCR NEGATIVE NEGATIVE    Comment: (NOTE) Fact Sheet for Patients: bloggercourse.com  Fact Sheet for Healthcare Providers: seriousbroker.it  This test is not yet approved or cleared by the [place]  FDA and has been authorized for detection and/or diagnosis of SARS-CoV-2 by FDA under an Emergency Use Authorization (EUA). This EUA will remain in effect (meaning this test can be used) for the duration of the COVID-19 declaration under Section 564(b)(1) of the Act, 21 U.S.C. section 360bbb-3(b)(1), unless the authorization is terminated or revoked.  Performed at [facility], [address] [place], [place] [address]   Troponin I (High Sensitivity)     Status: None   Collection Time: 01/22/24 11:57 PM  Result Value Ref Range   Troponin I (High  Sensitivity) 10 <18 ng/L    Comment: (NOTE) Elevated high sensitivity troponin I (hsTnI) values and significant  changes across serial measurements Rebuck suggest ACS but many other  chronic and acute conditions are known to elevate hsTnI results.  Refer to the Links section for chest pain algorithms and additional  guidance. Performed at [facility], [address] [place], [place] [address]   TSH     Status: None   Collection Time: 01/22/24 11:57 PM  Result Value Ref Range   TSH 3.144 0.350 - 4.500 uIU/mL    Comment: Performed by a 3rd Generation assay with a functional sensitivity of <=0.01 uIU/mL. Performed at [facility], [address] [place], [place] [address]   Comprehensive metabolic panel     Status: Abnormal   Collection Time: 01/23/24  5:55 AM  Result Value Ref Range   Sodium 142 135 - 145 mmol/L   Potassium 3.6 3.5 - 5.1 mmol/L   Chloride 105 98 - 111 mmol/L   CO2 26 22 - 32 mmol/L   Glucose, Bld 107 (H) 70 - 99 mg/dL    Comment: Glucose reference range applies only to samples taken after fasting for at least 8 hours.   BUN 22 8 - 23 mg/dL   Creatinine, Ser 8.86 (H) 0.44 - 1.00 mg/dL   Calcium  8.7 (L) 8.9 - 10.3 mg/dL   Total Protein 6.6 6.5 - 8.1 g/dL   Albumin 3.6 3.5 - 5.0 g/dL   AST 19 15 - 41 U/L   ALT 13 0 - 44 U/L   Alkaline Phosphatase 65 38 - 126 U/L   Total Bilirubin 0.5 0.0 - 1.2 mg/dL   GFR, Estimated 50 (L) >60 mL/min    Comment: (NOTE) Calculated using the CKD-EPI Creatinine Equation (2021)    Anion gap 11 5 - 15    Comment: Performed at [facility], [address] [place], [place] [address]  CBC     Status: None   Collection Time: 01/23/24  5:55 AM  Result Value Ref Range   WBC 7.5 4.0 - 10.5 K/uL   RBC 5.01 3.87 - 5.11 MIL/uL   Hemoglobin 13.9 12.0 - 15.0 g/dL   HCT 57.2 63.9 - 53.9 %   MCV 85.2 80.0 - 100.0 fL   MCH 27.7 26.0 - 34.0 pg   MCHC 32.6 30.0 - 36.0 g/dL   RDW 86.1 88.4 - 84.4 %   Platelets  279 150 - 400 K/uL   nRBC 0.0 0.0 - 0.2 %    Comment: Performed at [facility], [address] [place], [place] [address]  CBG monitoring, ED     Status: Abnormal   Collection Time: 01/23/24 11:51 AM  Result Value Ref Range   Glucose-Capillary 114 (H) 70 - 99 mg/dL    Comment: Glucose reference range applies only to samples taken after fasting for at least 8 hours.   Comment 1 Notify RN    Comment 2 Document in Chart    Comment 3 Call MD NNP PA CNM   Comprehensive metabolic panel     Status: Abnormal   Collection Time: 02/08/24 10:24 AM  Result Value Ref Range   Glucose 119 (H) 70 - 99 mg/dL   BUN 11 8 - 27 mg/dL   Creatinine, Ser 8.96 (H) 0.57 - 1.00 mg/dL   eGFR 56 (L) >40 fO/fpw/8.26   BUN/Creatinine Ratio 11 (L) 12 - 28   Sodium 142 134 - 144 mmol/L   Potassium 4.2 3.5 - 5.2 mmol/L   Chloride 104 96 - 106 mmol/L   CO2 25 20 - 29 mmol/L   Calcium  9.0 8.7 - 10.3 mg/dL   Total Protein 5.9 (L) 6.0 - 8.5 g/dL   Albumin 3.7 (L) 3.8 - 4.8 g/dL   Globulin, Total 2.2 1.5 - 4.5 g/dL   Bilirubin Total 0.7 0.0 - 1.2 mg/dL   Alkaline Phosphatase 83 49 - 135 IU/L   AST 19 0 - 40 IU/L   ALT 8 0 - 32 IU/L  Basic metabolic panel     Status: Abnormal   Collection Time: 02/13/24 10:23 AM  Result Value Ref Range   Sodium 138 135 - 145 mmol/L   Potassium 3.7 3.5 - 5.1 mmol/L   Chloride 103 98 - 111 mmol/L   CO2 24 22 - 32 mmol/L   Glucose, Bld 125 (H) 70 - 99 mg/dL    Comment: Glucose reference range applies only to samples taken after fasting for at least 8 hours.   BUN 12 8 - 23 mg/dL   Creatinine, Ser 9.03 0.44 - 1.00 mg/dL   Calcium  8.4 (L) 8.9 - 10.3 mg/dL   GFR, Estimated >39 >39 mL/min    Comment: (NOTE) Calculated using the CKD-EPI Creatinine Equation (2021)    Anion gap 11 5 - 15    Comment: Performed at [facility], [address] [place], [place] [address]  CBC     Status: None   Collection Time: 02/13/24 10:23 AM  Result Value Ref Range   WBC 6.2  4.0 - 10.5 K/uL   RBC 4.95 3.87 - 5.11 MIL/uL   Hemoglobin 13.7 12.0 - 15.0 g/dL   HCT 58.0 63.9 - 53.9 %   MCV 84.6 80.0 - 100.0 fL   MCH 27.7 26.0 - 34.0 pg   MCHC 32.7 30.0 - 36.0 g/dL   RDW 86.3 88.4 - 84.4 %   Platelets 248 150 - 400 K/uL   nRBC 0.0 0.0 - 0.2 %    Comment: Performed at [facility], [address], [place], [place] [address]  BNP (Order if Patient has history of Heart Failure)     Status: Abnormal   Collection Time: 02/13/24 10:23 AM  Result Value Ref Range   B Natriuretic Peptide 321.5 (H) 0.0 - 100.0 pg/mL    Comment: Performed at [facility], [address], [place], [place] [address]  Troponin I (High Sensitivity)     Status: Abnormal   Collection Time: 02/13/24 10:23 AM  Result Value Ref Range  Troponin I (High Sensitivity) 18 (H) <18 ng/L    Comment: (NOTE) Elevated high sensitivity troponin I (hsTnI) values and significant  changes across serial measurements Kohrs suggest ACS but many other  chronic and acute conditions are known to elevate hsTnI results.  Refer to the Links section for chest pain algorithms and additional  guidance. Performed at [facility], [address] [place], [place] [address]   Resp panel by RT-PCR (RSV, Flu A&B, Covid) Anterior Nasal Swab     Status: None   Collection Time: 02/13/24 12:54 PM   Specimen: Anterior Nasal Swab  Result Value Ref Range   SARS Coronavirus 2 by RT PCR NEGATIVE NEGATIVE    Comment: (NOTE) SARS-CoV-2 target nucleic acids are NOT DETECTED.  The SARS-CoV-2 RNA is generally detectable in upper respiratory specimens during the acute phase of infection. The lowest concentration of SARS-CoV-2 viral copies this assay can detect is 138 copies/mL. A negative result does not preclude SARS-Cov-2 infection and should not be used as the sole basis for treatment or other patient management decisions. A negative result [place] occur with  improper specimen collection/handling,  submission of specimen other than nasopharyngeal swab, presence of viral mutation(s) within the areas targeted by this assay, and inadequate number of viral copies(<138 copies/mL). A negative result must be combined with clinical observations, patient history, and epidemiological information. The expected result is Negative.  Fact Sheet for Patients:  bloggercourse.com  Fact Sheet for Healthcare Providers:  seriousbroker.it  This test is no t yet approved or cleared by the [place]  FDA and  has been authorized for detection and/or diagnosis of SARS-CoV-2 by FDA under an Emergency Use Authorization (EUA). This EUA will remain  in effect (meaning this test can be used) for the duration of the COVID-19 declaration under Section 564(b)(1) of the Act, 21 U.S.C.section 360bbb-3(b)(1), unless the authorization is terminated  or revoked sooner.       Influenza A by PCR NEGATIVE NEGATIVE   Influenza B by PCR NEGATIVE NEGATIVE    Comment: (NOTE) The Xpert Xpress SARS-CoV-2/FLU/RSV plus assay is intended as an aid in the diagnosis of influenza from Nasopharyngeal swab specimens and should not be used as a sole basis for treatment. Nasal washings and aspirates are unacceptable for Xpert Xpress SARS-CoV-2/FLU/RSV testing.  Fact Sheet for Patients: bloggercourse.com  Fact Sheet for Healthcare Providers: seriousbroker.it  This test is not yet approved or cleared by the [place]  FDA and has been authorized for detection and/or diagnosis of SARS-CoV-2 by FDA under an Emergency Use Authorization (EUA). This EUA will remain in effect (meaning this test can be used) for the duration of the COVID-19 declaration under Section 564(b)(1) of the Act, 21 U.S.C. section 360bbb-3(b)(1), unless the authorization is terminated or revoked.     Resp Syncytial Virus by PCR NEGATIVE NEGATIVE     Comment: (NOTE) Fact Sheet for Patients: bloggercourse.com  Fact Sheet for Healthcare Providers: seriousbroker.it  This test is not yet approved or cleared by the [place]  FDA and has been authorized for detection and/or diagnosis of SARS-CoV-2 by FDA under an Emergency Use Authorization (EUA). This EUA will remain in effect (meaning this test can be used) for the duration of the COVID-19 declaration under Section 564(b)(1) of the Act, 21 U.S.C. section 360bbb-3(b)(1), unless the authorization is terminated or revoked.  Performed at [facility], [address], [place], [place] [address]   Troponin I (High Sensitivity)     Status: Abnormal   Collection Time:  02/13/24 12:54 PM  Result Value Ref Range   Troponin I (High Sensitivity) 19 (H) <18 ng/L    Comment: (NOTE) Elevated high sensitivity troponin I (hsTnI) values and significant  changes across serial measurements Neider suggest ACS but many other  chronic and acute conditions are known to elevate hsTnI results.  Refer to the Links section for chest pain algorithms and additional  guidance. Performed at [facility], [address] [place], [place] [address]   Respiratory (~20 pathogens) panel by PCR     Status: Abnormal   Collection Time: 02/13/24  7:49 PM   Specimen: Nasopharyngeal Swab; Respiratory  Result Value Ref Range   Adenovirus NOT DETECTED NOT DETECTED   Coronavirus 229E NOT DETECTED NOT DETECTED    Comment: (NOTE) The Coronavirus on the Respiratory Panel, DOES NOT test for the novel  Coronavirus (2019 nCoV)    Coronavirus HKU1 NOT DETECTED NOT DETECTED   Coronavirus NL63 NOT DETECTED NOT DETECTED   Coronavirus OC43 NOT DETECTED NOT DETECTED   Metapneumovirus NOT DETECTED NOT DETECTED   Rhinovirus / Enterovirus DETECTED (A) NOT DETECTED   Influenza A NOT DETECTED NOT DETECTED   Influenza B NOT DETECTED NOT DETECTED   Parainfluenza  Virus 1 NOT DETECTED NOT DETECTED   Parainfluenza Virus 2 NOT DETECTED NOT DETECTED   Parainfluenza Virus 3 NOT DETECTED NOT DETECTED   Parainfluenza Virus 4 NOT DETECTED NOT DETECTED   Respiratory Syncytial Virus NOT DETECTED NOT DETECTED   Bordetella pertussis NOT DETECTED NOT DETECTED   Bordetella Parapertussis NOT DETECTED NOT DETECTED   Chlamydophila pneumoniae NOT DETECTED NOT DETECTED   Mycoplasma pneumoniae NOT DETECTED NOT DETECTED    Comment: Performed at [facility], [address], [place], [place] [address]  HIV Antibody (routine testing w rflx)     Status: None   Collection Time: 02/14/24  4:30 AM  Result Value Ref Range   HIV Screen 4th Generation wRfx Non Reactive Non Reactive    Comment: Performed at [facility], [address] [place], [place] [address]  CBC     Status: None   Collection Time: 02/14/24  4:30 AM  Result Value Ref Range   WBC 6.6 4.0 - 10.5 K/uL   RBC 4.90 3.87 - 5.11 MIL/uL   Hemoglobin 13.6 12.0 - 15.0 g/dL   HCT 58.9 63.9 - 53.9 %   MCV 83.7 80.0 - 100.0 fL   MCH 27.8 26.0 - 34.0 pg   MCHC 33.2 30.0 - 36.0 g/dL   RDW 86.4 88.4 - 84.4 %   Platelets 231 150 - 400 K/uL   nRBC 0.0 0.0 - 0.2 %    Comment: Performed at [facility], [address], [place], [place] [address]  Basic metabolic panel     Status: Abnormal   Collection Time: 02/14/24  4:30 AM  Result Value Ref Range   Sodium 137 135 - 145 mmol/L   Potassium 3.5 3.5 - 5.1 mmol/L   Chloride 102 98 - 111 mmol/L   CO2 25 22 - 32 mmol/L   Glucose, Bld 195 (H) 70 - 99 mg/dL    Comment: Glucose reference range applies only to samples taken after fasting for at least 8 hours.   BUN 20 8 - 23 mg/dL   Creatinine, Ser 9.06 0.44 - 1.00 mg/dL   Calcium  8.5 (L) 8.9 - 10.3 mg/dL   GFR, Estimated >39 >39 mL/min    Comment: (NOTE) Calculated using the CKD-EPI Creatinine Equation (  2021)    Anion gap 10 5 - 15    Comment: Performed at [facility], [address] [place], [place] [address]  Magnesium      Status: None   Collection Time: 02/14/24  4:30 AM  Result Value Ref Range   Magnesium  2.1 1.7 - 2.4 mg/dL    Comment: Performed at [id number], [address] [place], [place] [address]  CBC     Status: Abnormal   Collection Time: 02/15/24  5:48 AM  Result Value Ref Range   WBC 14.1 (H) 4.0 - 10.5 K/uL   RBC 4.99 3.87 - 5.11 MIL/uL   Hemoglobin 13.6 12.0 - 15.0 g/dL   HCT 56.3 63.9 - 53.9 %   MCV 87.4 80.0 - 100.0 fL   MCH 27.3 26.0 - 34.0 pg   MCHC 31.2 30.0 - 36.0 g/dL   RDW 86.3 88.4 - 84.4 %   Platelets 317 150 - 400 K/uL   nRBC 0.0 0.0 - 0.2 %    Comment: Performed at [facility], [address], [place], [place] [address]  Basic metabolic panel with GFR     Status: Abnormal   Collection Time: 02/15/24  5:48 AM  Result Value Ref Range   Sodium 138 135 - 145 mmol/L   Potassium 4.4 3.5 - 5.1 mmol/L   Chloride 103 98 - 111 mmol/L   CO2 26 22 - 32 mmol/L   Glucose, Bld 226 (H) 70 - 99 mg/dL    Comment: Glucose reference range applies only to samples taken after fasting for at least 8 hours.   BUN 29 (H) 8 - 23 mg/dL   Creatinine, Ser 8.71 (H) 0.44 - 1.00 mg/dL   Calcium  8.5 (L) 8.9 - 10.3 mg/dL   GFR, Estimated 43 (L) >60 mL/min    Comment: (NOTE) Calculated using the CKD-EPI Creatinine Equation (2021)    Anion gap 9 5 - 15    Comment: Performed at [facility], [address] [place], [place] [address]  POCT Urinalysis Dipstick [id number])     Status: Abnormal   Collection Time: 03/01/24  3:37 PM  Result Value Ref Range   Color, UA Yellow    Clarity, UA Clear    Glucose, UA Positive (A) Negative   Bilirubin, UA Negative    Ketones, UA Negative    Spec Grav, UA 1.015 1.010 - 1.025   Blood, UA Negative    pH, UA 5.5 5.0 - 8.0   Protein, UA Negative Negative   Urobilinogen, UA 0.2 0.2 or 1.0 E.U./dL   Nitrite, UA Negative    Leukocytes, UA Negative Negative   Appearance Clear    Odor No    Culture, Urine     Status: None   Collection Time: 03/01/24  4:04 PM   Specimen: Urine   UR  Result Value Ref Range   Urine Culture, Routine Final report    Organism ID, Bacteria Comment     Comment: Mixed urogenital flora 10,000-25,000 colony forming units per mL   Iron, TIBC and Ferritin Panel     Status: Abnormal   Collection Time: 03/06/24 12:04 PM  Result Value Ref Range   Total Iron Binding Capacity 322 250 - 450 ug/dL   UIBC 780 881 - 630 ug/dL   Iron 896 27 - 860 ug/dL   Iron Saturation 32 15 - 55 %   Ferritin 184 (H) 15 - 150 ng/mL  Lipid panel     Status:  Abnormal   Collection Time: 03/06/24 12:06 PM  Result Value Ref Range   Cholesterol, Total 131 100 - 199 mg/dL   Triglycerides 824 (H) 0 - 149 mg/dL   HDL 46 >60 mg/dL   VLDL Cholesterol Cal 29 5 - 40 mg/dL   LDL Chol Calc (NIH) 56 0 - 99 mg/dL   Chol/HDL Ratio 2.8 0.0 - 4.4 ratio    Comment:                                   T. Chol/HDL Ratio                                             Men  Women                               1/2 Avg.Risk  3.4    3.3                                   Avg.Risk  5.0    4.4                                2X Avg.Risk  9.6    7.1                                3X Avg.Risk 23.4   11.0   VITAMIN D  25 Hydroxy (Vit-D Deficiency, Fractures)     Status: None   Collection Time: 03/06/24 12:06 PM  Result Value Ref Range   Vit D, 25-Hydroxy 57.5 30.0 - 100.0 ng/mL    Comment: Vitamin D  deficiency has been defined by the Institute of Medicine and an Endocrine Society practice guideline as a level of serum 25-OH vitamin D  less than 20 ng/mL (1,2). The Endocrine Society went on to further define vitamin D  insufficiency as a level between 21 and 29 ng/mL (2). 1. IOM (Institute of Medicine). 2010. Dietary reference    intakes for calcium  and D. [place]: The    [employer]. 2. Holick MF, [place], Bischoff-Ferrari HA, et al.    Evaluation, treatment, and prevention of  vitamin D     deficiency: an Endocrine Society clinical practice    guideline. JCEM. 2011 Jul; 96(7):1911-30.   CMP14+EGFR     Status: Abnormal   Collection Time: 03/06/24 12:06 PM  Result Value Ref Range   Glucose 104 (H) 70 - 99 mg/dL   BUN 27 8 - 27 mg/dL   Creatinine, Ser 8.47 (H) 0.57 - 1.00 mg/dL   eGFR 35 (L) >40 fO/fpw/8.26   BUN/Creatinine Ratio 18 12 - 28   Sodium 138 134 - 144 mmol/L   Potassium 4.2 3.5 - 5.2 mmol/L   Chloride 98 96 - 106 mmol/L   CO2 23 20 - 29 mmol/L   Calcium  9.5 8.7 - 10.3 mg/dL   Total Protein 6.5 6.0 - 8.5 g/dL   Albumin 4.2 3.8 - 4.8 g/dL   Globulin, Total 2.3 1.5 - 4.5 g/dL   Bilirubin Total 1.2 0.0 - 1.2  mg/dL   Alkaline Phosphatase 96 49 - 135 IU/L   AST 19 0 - 40 IU/L   ALT 15 0 - 32 IU/L  TSH     Status: None   Collection Time: 03/06/24 12:06 PM  Result Value Ref Range   TSH 3.490 0.450 - 4.500 uIU/mL  Hemoglobin A1c     Status: Abnormal   Collection Time: 03/06/24 12:06 PM  Result Value Ref Range   Hgb A1c MFr Bld 6.2 (H) 4.8 - 5.6 %    Comment:          Prediabetes: 5.7 - 6.4          Diabetes: >6.4          Glycemic control for adults with diabetes: <7.0    Est. average glucose Bld gHb Est-mCnc 131 mg/dL  Vitamin A87     Status: None   Collection Time: 03/06/24 12:06 PM  Result Value Ref Range   Vitamin B-12 612 232 - 1,245 pg/mL  CBC with Diff     Status: Abnormal   Collection Time: 03/06/24 12:06 PM  Result Value Ref Range   WBC 9.8 3.4 - 10.8 x10E3/uL   RBC 5.57 (H) 3.77 - 5.28 x10E6/uL   Hemoglobin 16.0 (H) 11.1 - 15.9 g/dL   Hematocrit 50.8 (H) 65.9 - 46.6 %   MCV 88 79 - 97 fL   MCH 28.7 26.6 - 33.0 pg   MCHC 32.6 31.5 - 35.7 g/dL   RDW 85.6 88.2 - 84.5 %   Platelets 317 150 - 450 x10E3/uL   Neutrophils 69 Not Estab. %   Lymphs 20 Not Estab. %   Monocytes 8 Not Estab. %   Eos 1 Not Estab. %   Basos 1 Not Estab. %   Neutrophils Absolute 6.8 1.4 - 7.0 x10E3/uL   Lymphocytes Absolute 1.9 0.7 - 3.1 x10E3/uL    Monocytes Absolute 0.8 0.1 - 0.9 x10E3/uL   EOS (ABSOLUTE) 0.1 0.0 - 0.4 x10E3/uL   Basophils Absolute 0.1 0.0 - 0.2 x10E3/uL   Immature Granulocytes 1 Not Estab. %   Immature Grans (Abs) 0.1 0.0 - 0.1 x10E3/uL  Mononucleosis screen     Status: Abnormal   Collection Time: 03/06/24 12:06 PM  Result Value Ref Range   Mono Screen Positive (A) Negative    Comment: The sensitivity of Heterophile antibody testing is 80-90%. Elbert Shine IgM testing offers higher sensitivity.   Lyme Disease Serology w/Reflex     Status: None   Collection Time: 03/06/24 12:08 PM  Result Value Ref Range   Lyme Total Antibody EIA Negative Negative    Comment: Lyme antibodies not detected. Reflex testing is not indicated. No laboratory evidence of infection with B. burgdorferi (Lyme disease). Negative results Chastang occur in patients recently infected (less than or equal to 14 days) with B. burgdorferi.  If recent infection is suspected, repeat testing on a new sample collected in 7 to 14 days is recommended.        Assessment & Plan Morbid obesity with BMI of 50.0-59.9, adult (HCC) Continue current meds.  Will adjust as needed based on results.  The patient is asked to make an attempt to improve diet and exercise patterns to aid in medical management of this problem. Addressed importance of increasing and maintaining water intake.   Infectious mononucleosis without complication, infectious mononucleosis due to unspecified organism Patient is aware of diagnosis and will be resting and getting more fluids.   We will recheck her kidney  levels next week, to make sure her kidney function is back to normal. If not, will consider referral.     Return in about 1 month (around 04/11/2024) for AWV.   Total time spent: 20 minutes  ALAN CHRISTELLA ARRANT, FNP  03/11/2024   This document [place] have been prepared by [facility] Voice Recognition software and as such [place] include unintentional dictation errors.

## 2024-03-11 NOTE — Assessment & Plan Note (Signed)
 Continue current meds.  Will adjust as needed based on results.  The patient is asked to make an attempt to improve diet and exercise patterns to aid in medical management of this problem. Addressed importance of increasing and maintaining water  intake.

## 2024-03-14 ENCOUNTER — Other Ambulatory Visit: Payer: Self-pay

## 2024-03-14 NOTE — Transitions of Care (Post Inpatient/ED Visit) (Signed)
 Transition of Care week 5  Visit Note  03/14/2024  Name: Patricia Walker MRN: [id number]          DOB: 1945/04/18  Situation: Patient enrolled in [facility] 30-day program. Visit completed with Tilton Lapp by telephone.   Background:   Initial Transition Care Management Follow-up Telephone Call Discharge Date and Diagnosis: 02/14/24, COPD Exacerbation   Past Medical History:  Diagnosis Date   Anxiety    Arthritis    Asthma    CHF (congestive heart failure) (HCC)    COPD exacerbation (HCC) 10/08/2020   Coronary artery disease    mild, nonobstructive   Depression    Dyspnea    on exertion   Dysrhythmia    Atrial Fibrillation   GERD (gastroesophageal reflux disease)    Heart murmur    Hyperlipidemia    Hypertension    Hypothyroidism    MI, old 2016   nonobstructive CAD by Cath   Morbid obesity (HCC)    Persistent atrial fibrillation (HCC)    Pneumonia 06/2018   and RSV   Psoriasis    Sleep apnea    compliant with CPAP   Thyroid  disease     Assessment: Patient Reported Symptoms: Cognitive Cognitive Status: Alert and oriented to person, place, and time, Normal speech and language skills      Neurological Neurological Review of Symptoms: No symptoms reported    HEENT HEENT Symptoms Reported: Not assessed      Cardiovascular Cardiovascular Symptoms Reported: Swelling in legs or feet Does patient have uncontrolled Hypertension?: No Cardiovascular Management Strategies: Medication therapy, Adequate rest  Respiratory Respiratory Symptoms Reported: Wheezing, Dry cough, Shortness of breath Additional Respiratory Details: The patient has tested positive for Mono. She states she is better but still fatigued ans has a cough Respiratory Management Strategies: Routine screening, Medication therapy, Adequate rest  Endocrine Endocrine Symptoms Reported: No symptoms reported Is patient diabetic?: No    Gastrointestinal Gastrointestinal Symptoms Reported: No symptoms reported       Genitourinary Genitourinary Symptoms Reported: No symptoms reported    Integumentary Integumentary Symptoms Reported: No symptoms reported    Musculoskeletal Musculoskelatal Symptoms Reviewed: Weakness        Psychosocial Psychosocial Symptoms Reported: No symptoms reported         There were no vitals filed for this visit. Pain Scale: 0-10 Pain Score: 6  Pain Type: Acute pain Pain Location: Flank Pain Orientation: Right Pain Onset: Unable to tell Patients Stated Pain Goal: 0 Pain Intervention(s): Medication (See eMAR), Rest  Medications Reviewed Today     Reviewed by Moises Reusing, RN (Case Manager) on 03/14/24 at 1511  Med List Status: <None>   Medication Order Taking? Sig Documenting Provider Last Dose Status Informant  acetaminophen  (TYLENOL ) 325 MG tablet [id number]  Take 2 tablets (650 mg total) by mouth every 6 (six) hours as needed for mild pain (pain score 1-3) or moderate pain (pain score 4-6). Jens Durand, MD  Active Self, Pharmacy Records  albuterol  (VENTOLIN  HFA) 108 [phone] Base) MCG/ACT inhaler [id number]  Inhale 1-2 puffs into the lungs every 4 (four) hours as needed for wheezing or shortness of breath. Lenon Marien CROME, MD  Active   atorvastatin  (LIPITOR) 40 MG tablet [id number]  Take 40 mg by mouth daily. [provider]  Active   busPIRone  (BUSPAR ) 7.5 MG tablet [id number]  Take 7.5 mg by mouth 2 (two) times daily. [provider]  Active Self, Pharmacy Records  dapagliflozin  propanediol (FARXIGA ) 10 MG TABS tablet [id number]  Take 1 tablet (10 mg total) by mouth daily before breakfast. Fernand Denyse LABOR, MD  Active Self, Pharmacy Records  ELIQUIS  5 MG TABS tablet [id number]  TAKE 1 TABLET BY MOUTH TWICE A DAY [name], FNP  Active Self, Pharmacy Records  enalapril  (VASOTEC ) 2.5 MG tablet [id number]  Take 1 tablet (2.5 mg total) by mouth daily. Fernand Denyse LABOR, MD  Active Self, Pharmacy Records  esomeprazole (NEXIUM) 40 MG capsule  [id number]  TAKE 1 CAPSULE BY MOUTH EVERY DAY IN THE MORNING [name], FNP  Active Self, Pharmacy Records  fluticasone  (FLONASE ) 50 MCG/ACT nasal spray [id number]  Place 1 spray into both nostrils daily.  Patient taking differently: Place 1 spray into both nostrils daily as needed.   [name], FNP  Active Self, Pharmacy Records  furosemide  (LASIX ) 20 MG tablet [id number]  Take 1 tablet (20 mg total) by mouth daily. Fernand Denyse LABOR, MD  Active Self, Pharmacy Records  ipratropium-albuterol  (DUONEB) 0.5-2.5 (3) MG/3ML SOLN [id number]  Take 3 mLs by nebulization every 6 (six) hours as needed. [name], FNP  Active Self, Pharmacy Records  meclizine  (ANTIVERT ) 12.5 MG tablet [id number]  Take 1 tablet (12.5 mg total) by mouth 2 (two) times daily as needed for dizziness. Cox, Amy N, DO  Active Self, Pharmacy Records  montelukast  (SINGULAIR ) 10 MG tablet [id number]  Take 1 tablet (10 mg total) by mouth at bedtime. [name], FNP  Active   nystatin  powder [id number]  Apply 1 Application topically 3 (three) times daily as needed. Irritation under breast and groin area [name], FNP  Active Self, Pharmacy Records  predniSONE  (DELTASONE ) 20 MG tablet [id number]  Take 2 tablets (40 mg total) by mouth daily with breakfast. [name], FNP  Active   TALTZ 80 MG/ML SOAJ [id number]  Inject 80 mg into the skin every 28 (twenty-eight) days. [provider]  Active Self, Pharmacy Records  Tiotropium Bromide  (SPIRIVA  RESPIMAT) 2.5 MCG/ACT AERS [id number]  Inhale 2 puffs into the lungs daily. Lenon Marien CROME, MD  Active   Vibegron  (GEMTESA ) 75 MG TABS [id number]  Take 1 tablet (75 mg total) by mouth daily. [name], FNP  Active Self, Pharmacy Records            Recommendation:   Continue Current Plan of Care. Discharge from [facility] next week   Follow Up Plan:   Telephone follow-up in 1 week  Medford Balboa, BSN, RN [place]  VBCI - [facility]  Health RN Care Manager [phone]

## 2024-03-14 NOTE — Patient Instructions (Signed)
 Visit Information  Thank you for taking time to visit with me today. Please don't hesitate to contact me if I can be of assistance to you before our next scheduled telephone appointment.  Our next appointment is by telephone on Wednesday December 10th at 11:00am  Following is a copy of your care plan:   Goals Addressed             This Visit's Progress    VBCI Transitions of Care (TOC) Care Plan       Problems: (reviewed 03/14/24) Recent Hospitalization for treatment of COPD Hospital or ED Adm Risk 97%  Goal: (reviewed 03/14/24) Over the next 30 days, the patient will not experience hospital readmission  Interventions: (reviewed 03/14/24)  COPD Interventions: Advised patient to track and manage COPD triggers Discussed the importance of adequate rest and management of fatigue with COPD Provided education about and advised patient to utilize infection prevention strategies to reduce risk of respiratory infection Provided instruction about proper use of medications used for management of COPD including inhalers Take Albuterol  108 mcg/act inhaler 1-2 puff every four hours as needed FT Mucus Relief DM 30-600mg  tab two times a day for 10 days - 02/25/24 Impratropium-albuterol  0.5-2.5 mg/3ml every six hours as needed Spiriva  Respimate 2.5mcg 2 puffs daily - 11/18 - Predisone taper, Z-Pack, Singulair  Follow up with PCP in one week - 03/06/24 - Completed 03/06/24 - The patient has been started on Doxycycline  100mg  for a week due to not feeling much better - completed 12/4 - completed PCP appointment 03/11/24. Repeat labs next week for Kidney tests  Patient Self Care Activities: (reviewed 03/14/24) Attend all scheduled provider appointments Call pharmacy for medication refills 3-7 days in advance of running out of medications Call provider office for new concerns or questions  Notify RN Care Manager of [facility] call rescheduling needs Participate in Transition of Care Program/Attend [facility]  scheduled calls Perform all self care activities independently  Take medications as prescribed    Plan:  Telephone follow up appointment with care management team member scheduled for:  Thursday December 10th at 11:00am        Patient verbalizes understanding of instructions and care plan provided today and agrees to view in MyChart. Active MyChart status and patient understanding of how to access instructions and care plan via MyChart confirmed with patient.     The patient has been provided with contact information for the care management team and has been advised to call with any health related questions or concerns.   Please call the care guide team at [phone] if you need to cancel or reschedule your appointment.   Please call the Suicide and Crisis Lifeline: 988 call the [place]  National Suicide Prevention Lifeline: 662 302 0719 or TTY: 972-709-5020 TTY 310-112-6390) to talk to a trained counselor if you are experiencing a Mental Health or Behavioral Health Crisis or need someone to talk to.  Medford Balboa, BSN, RN [place]  VBCI - [employer] Health RN Care Manager [phone]

## 2024-03-18 MED FILL — TALTZ AUTOINJECTOR 80 MG/ML SUBCUTANEOUS: SUBCUTANEOUS | 28 days supply | Qty: 1 | Fill #5

## 2024-03-20 ENCOUNTER — Other Ambulatory Visit

## 2024-03-20 ENCOUNTER — Other Ambulatory Visit: Payer: Self-pay

## 2024-03-20 DIAGNOSIS — N179 Acute kidney failure, unspecified: Secondary | ICD-10-CM

## 2024-03-20 NOTE — Transitions of Care (Post Inpatient/ED Visit) (Signed)
 Transition of Care Week 6  Visit Note  03/20/2024  Name: Patricia Walker MRN: [id number]          DOB: 06-28-1945  Situation: Patient enrolled in [facility] 30-day program. Visit completed with Tilton Nedved by telephone.   Background:   Initial Transition Care Management Follow-up Telephone Call Discharge Date and Diagnosis: 02/14/24, COPD Exacerbation   Past Medical History:  Diagnosis Date   Anxiety    Arthritis    Asthma    CHF (congestive heart failure) (HCC)    COPD exacerbation (HCC) 10/08/2020   Coronary artery disease    mild, nonobstructive   Depression    Dyspnea    on exertion   Dysrhythmia    Atrial Fibrillation   GERD (gastroesophageal reflux disease)    Heart murmur    Hyperlipidemia    Hypertension    Hypothyroidism    MI, old 2016   nonobstructive CAD by Cath   Morbid obesity (HCC)    Persistent atrial fibrillation (HCC)    Pneumonia 06/2018   and RSV   Psoriasis    Sleep apnea    compliant with CPAP   Thyroid  disease     Assessment: Patient Reported Symptoms: Cognitive Cognitive Status: Alert and oriented to person, place, and time, Normal speech and language skills      Neurological Neurological Review of Symptoms: No symptoms reported    HEENT HEENT Symptoms Reported: No symptoms reported      Cardiovascular Cardiovascular Symptoms Reported: No symptoms reported Does patient have uncontrolled Hypertension?: No Cardiovascular Management Strategies: Medication therapy, Routine screening  Respiratory Respiratory Symptoms Reported: Dry cough, Shortness of breath Additional Respiratory Details: The patient continues to complain of fatigue but she states it is improving Respiratory Management Strategies: Routine screening, Medication therapy, Adequate rest  Endocrine Endocrine Symptoms Reported: No symptoms reported Is patient diabetic?: No    Gastrointestinal Gastrointestinal Symptoms Reported: Constipation Gastrointestinal Management Strategies:  Medication therapy, Fluid modification Gastrointestinal Comment: The patient has had some recent bouts of constipation. She states the stool is hard and she had some blood while passing it. She is encouraged to take a stool softner and to not go more than three days with out moving her bowels.    Genitourinary Genitourinary Symptoms Reported: No symptoms reported Genitourinary Management Strategies: Medication therapy  Integumentary Integumentary Symptoms Reported: No symptoms reported    Musculoskeletal Musculoskelatal Symptoms Reviewed: Weakness        Psychosocial Psychosocial Symptoms Reported: No symptoms reported         There were no vitals filed for this visit. Pain Scale: 0-10 Pain Score: 4  Pain Type: Acute pain Pain Location: Abdomen Pain Orientation: Mid Pain Descriptors / Indicators: Discomfort Pain Onset: Gradual Patients Stated Pain Goal: 0 Pain Intervention(s): Medication (See eMAR)  Medications Reviewed Today     Reviewed by Moises Reusing, RN (Case Manager) on 03/20/24 at 1124  Med List Status: <None>   Medication Order Taking? Sig Documenting Provider Last Dose Status Informant  acetaminophen  (TYLENOL ) 325 MG tablet [id number]  Take 2 tablets (650 mg total) by mouth every 6 (six) hours as needed for mild pain (pain score 1-3) or moderate pain (pain score 4-6). Jens Durand, MD  Active Self, Pharmacy Records  albuterol  (VENTOLIN  HFA) 108 [id number] Base) MCG/ACT inhaler [id number]  Inhale 1-2 puffs into the lungs every 4 (four) hours as needed for wheezing or shortness of breath. Lenon Marien CROME, MD  Active   atorvastatin  (LIPITOR) 40 MG tablet [id number]  Take  40 mg by mouth daily. [provider]  Active   busPIRone  (BUSPAR ) 7.5 MG tablet [id number]  Take 7.5 mg by mouth 2 (two) times daily. [provider]  Active Self, Pharmacy Records  dapagliflozin  propanediol (FARXIGA ) 10 MG TABS tablet [id number]  Take 1 tablet (10 mg total) by mouth daily  before breakfast. Fernand Denyse LABOR, MD  Active Self, Pharmacy Records  ELIQUIS  5 MG TABS tablet [id number]  TAKE 1 TABLET BY MOUTH TWICE A DAY [name], FNP  Active Self, Pharmacy Records  enalapril  (VASOTEC ) 2.5 MG tablet [id number]  Take 1 tablet (2.5 mg total) by mouth daily. Fernand Denyse LABOR, MD  Active Self, Pharmacy Records  esomeprazole (NEXIUM) 40 MG capsule [id number]  TAKE 1 CAPSULE BY MOUTH EVERY DAY IN THE MORNING [name], FNP  Active Self, Pharmacy Records  fluticasone  (FLONASE ) 50 MCG/ACT nasal spray [id number]  Place 1 spray into both nostrils daily.  Patient taking differently: Place 1 spray into both nostrils daily as needed.   [name], FNP  Active Self, Pharmacy Records  furosemide  (LASIX ) 20 MG tablet [id number]  Take 1 tablet (20 mg total) by mouth daily. Fernand Denyse LABOR, MD  Active Self, Pharmacy Records  ipratropium-albuterol  (DUONEB) 0.5-2.5 (3) MG/3ML SOLN [id number]  Take 3 mLs by nebulization every 6 (six) hours as needed. [name], FNP  Active Self, Pharmacy Records  meclizine  (ANTIVERT ) 12.5 MG tablet [id number]  Take 1 tablet (12.5 mg total) by mouth 2 (two) times daily as needed for dizziness. Cox, Amy N, DO  Active Self, Pharmacy Records  montelukast  (SINGULAIR ) 10 MG tablet [id number]  Take 1 tablet (10 mg total) by mouth at bedtime. [name], FNP  Active   nystatin  powder [id number]  Apply 1 Application topically 3 (three) times daily as needed. Irritation under breast and groin area [name], FNP  Active Self, Pharmacy Records  predniSONE  (DELTASONE ) 20 MG tablet [id number]  Take 2 tablets (40 mg total) by mouth daily with breakfast.  Patient not taking: Reported on 03/14/2024   [name], FNP  Active   TALTZ 80 MG/ML SOAJ [id number]  Inject 80 mg into the skin every 28 (twenty-eight) days. [provider]  Active Self, Pharmacy Records  Vibegron  (GEMTESA ) 75 MG TABS [id number]  Take 1 tablet (75 mg total) by  mouth daily. [name], FNP  Active Self, Pharmacy Records            Recommendation:   Closure from the [facility] Program. Goals are met  Follow Up Plan:   Closing From:  Transitions of Care Program  [facility], BSN, RN   VBCI - [facility] RN Care Manager [phone]

## 2024-03-20 NOTE — Patient Instructions (Signed)
 Visit Information  Thank you for taking time to visit with me today. Please don't hesitate to contact me if I can be of assistance to you before our next scheduled telephone appointment.   Following is a copy of your care plan:   Goals Addressed             This Visit's Progress    COMPLETED: VBCI Transitions of Care (TOC) Care Plan       Problems: (reviewed 03/20/24) Recent Hospitalization for treatment of COPD Hospital or ED Adm Risk 97%  Goal: (reviewed 03/20/24) Over the next 30 days, the patient will not experience hospital readmission  Interventions: (reviewed 03/20/24)  COPD Interventions: Advised patient to track and manage COPD triggers Discussed the importance of adequate rest and management of fatigue with COPD Provided education about and advised patient to utilize infection prevention strategies to reduce risk of respiratory infection Provided instruction about proper use of medications used for management of COPD including inhalers Take Albuterol  108 mcg/act inhaler 1-2 puff every four hours as needed FT Mucus Relief DM 30-600mg  tab two times a day for 10 days - 02/25/24 Impratropium-albuterol  0.5-2.5 mg/3ml every six hours as needed Spiriva  Respimate 2.5mcg 2 puffs daily - 11/18 - Predisone taper, Z-Pack, Singulair  Follow up with PCP in one week - 03/06/24 - Completed 03/06/24 - The patient has been started on Doxycycline  100mg  for a week due to not feeling much better - completed 12/4 - completed PCP appointment 03/11/24. Repeat labs next week for Kidney tests 12/10 - Labs completed  Patient Self Care Activities: (reviewed 03/20/24) Attend all scheduled provider appointments Call pharmacy for medication refills 3-7 days in advance of running out of medications Call provider office for new concerns or questions  Notify RN Care Manager of [facility] call rescheduling needs Participate in Transition of Care Program/Attend TOC scheduled calls Perform all self care  activities independently  Take medications as prescribed    Plan:  Telephone follow up appointment with care management team member scheduled for:  The patient is disenrolled from [facility] program. Goals met        Patient verbalizes understanding of instructions and care plan provided today and agrees to view in MyChart. Active MyChart status and patient understanding of how to access instructions and care plan via MyChart confirmed with patient.     The patient has been provided with contact information for the care management team and has been advised to call with any health related questions or concerns.   Please call the care guide team at [phone] if you need to cancel or reschedule your appointment.   Please call the Suicide and Crisis Lifeline: 988 call the [place]  National Suicide Prevention Lifeline: 956-172-0932 or TTY: 870-703-3708 TTY 804 611 6335) to talk to a trained counselor if you are experiencing a Mental Health or Behavioral Health Crisis or need someone to talk to.  Medford Balboa, BSN, RN [place]  VBCI - [employer] [facility] RN Care Manager [phone]

## 2024-03-21 LAB — CMP14+EGFR
ALT: 15 IU/L (ref 0–32)
AST: 17 IU/L (ref 0–40)
Albumin: 3.7 g/dL — ABNORMAL LOW (ref 3.8–4.8)
Alkaline Phosphatase: 83 IU/L (ref 49–135)
BUN/Creatinine Ratio: 11 — ABNORMAL LOW (ref 12–28)
BUN: 14 mg/dL (ref 8–27)
Bilirubin Total: 0.6 mg/dL (ref 0.0–1.2)
CO2: 24 mmol/L (ref 20–29)
Calcium: 8.7 mg/dL (ref 8.7–10.3)
Chloride: 105 mmol/L (ref 96–106)
Creatinine, Ser: 1.29 mg/dL — ABNORMAL HIGH (ref 0.57–1.00)
Globulin, Total: 2.1 g/dL (ref 1.5–4.5)
Glucose: 98 mg/dL (ref 70–99)
Potassium: 3.9 mmol/L (ref 3.5–5.2)
Sodium: 144 mmol/L (ref 134–144)
Total Protein: 5.8 g/dL — ABNORMAL LOW (ref 6.0–8.5)
eGFR: 43 mL/min/1.73 — ABNORMAL LOW (ref >59)

## 2024-03-28 ENCOUNTER — Other Ambulatory Visit: Payer: Self-pay | Admitting: Family

## 2024-04-08 NOTE — Telephone Encounter (Signed)
 Informed.

## 2024-04-09 ENCOUNTER — Telehealth: Payer: Self-pay | Admitting: Family

## 2024-04-09 NOTE — Telephone Encounter (Signed)
 Patient left VM requesting Alan send her in something for the boils on her buttocks. Says they feel like they have fever and they are painful. She said Alan sent her something for these when she had them before. Please advise.

## 2024-04-12 ENCOUNTER — Other Ambulatory Visit: Payer: Self-pay

## 2024-04-12 MED ORDER — DOXYCYCLINE HYCLATE 100 MG PO TABS: 100.0000 mg | ORAL_TABLET | Freq: Two times a day (BID) | ORAL | 0 refills | Status: DC

## 2024-04-15 NOTE — Telephone Encounter (Signed)
 Pt informed

## 2024-04-24 ENCOUNTER — Telehealth

## 2024-04-24 NOTE — Patient Instructions (Signed)
 Patricia Walker - I am sorry I was unable to reach you today for our scheduled appointment. I work with [name], FNP and am calling to support your healthcare needs. Please contact me at [phone] at your earliest convenience. I look forward to speaking with you soon.   Thank you,  Hendricks Her RN, BSN  [place] VBCI-Population Health RN Case Manager   Direct [phone]

## 2024-04-26 ENCOUNTER — Other Ambulatory Visit: Payer: Self-pay

## 2024-04-26 NOTE — Patient Outreach (Signed)
 Complex Care Management   Visit Note  04/26/2024  Name:  Patricia Walker MRN: [id number] DOB: 07-Jul-1945  Situation: Referral received for Complex Care Management related to weight loss and pain  I obtained verbal consent from Patient.  Visit completed with Patient  on the phone  Background:   Past Medical History:  Diagnosis Date   Anxiety    Arthritis    Asthma    CHF (congestive heart failure) (HCC)    COPD exacerbation (HCC) 10/08/2020   Coronary artery disease    mild, nonobstructive   Depression    Dyspnea    on exertion   Dysrhythmia    Atrial Fibrillation   GERD (gastroesophageal reflux disease)    Heart murmur    Hyperlipidemia    Hypertension    Hypothyroidism    MI, old 2016   nonobstructive CAD by Cath   Morbid obesity (HCC)    Persistent atrial fibrillation (HCC)    Pneumonia 06/2018   and RSV   Psoriasis    Sleep apnea    compliant with CPAP   Thyroid  disease     Assessment: Patient Reported Symptoms:  Cognitive Cognitive Status: Alert and oriented to person, place, and time, Normal speech and language skills   Healing Pattern: Average Health Facilitated by: Rest  Neurological Neurological Review of Symptoms: Other: [place] Neurological Symptoms/Conditions [RPT]: neuropathy Neurological Management Strategies: Coping strategies, Medication therapy, Routine screening Neurological Self-Management Outcome: 4 (good)  HEENT HEENT Symptoms Reported: Tearing HEENT Management Strategies: Coping strategies, Medication therapy, Routine screening HEENT Self-Management Outcome: 4 (good)    Cardiovascular Cardiovascular Symptoms Reported: Fatigue, Swelling in legs or feet Does patient have uncontrolled Hypertension?: No Cardiovascular Management Strategies: Routine screening, Medication therapy Weight: 295 lb (133.8 kg) Cardiovascular Self-Management Outcome: 3 (uncertain)  Respiratory Respiratory Symptoms Reported: Shortness of breath Additional Respiratory  Details: inhalers Respiratory Management Strategies:  (Will see  pulmonolohist in February for lung nodule check) Respiratory Self-Management Outcome: 4 (good)  Endocrine Endocrine Symptoms Reported: No symptoms reported Is patient diabetic?: No Endocrine Self-Management Outcome: 4 (good)  Gastrointestinal Gastrointestinal Symptoms Reported: No symptoms reported Additional Gastrointestinal Details: Sometimes when patients Great Granddaughter sits on her lap it hurts Gastrointestinal Management Strategies: Coping strategies Gastrointestinal Self-Management Outcome: 4 (good)    Genitourinary Genitourinary Symptoms Reported: Frequency Genitourinary Management Strategies: Coping strategies Genitourinary Self-Management Outcome: 4 (good)  Integumentary Integumentary Symptoms Reported: Bruising, Other Other Integumentary Symptoms: Psoriasis Additional Integumentary Details: Eliquis  Skin Management Strategies: Coping strategies, Medication therapy, Routine screening Skin Self-Management Outcome: 4 (good)  Musculoskeletal Musculoskelatal Symptoms Reviewed: Joint pain, Limited mobility Additional Musculoskeletal Details: Right knee, shoulder pain Musculoskeletal Management Strategies: Medication therapy, Routine screening Musculoskeletal Self-Management Outcome: 3 (uncertain) Musculoskeletal Comment: Followed by Dr Maryann Noel in the past year?: No Number of falls in past year: 1 or less Was there an injury with Fall?: No Fall Risk Category Calculator: 0 Patient Fall Risk Level: Low Fall Risk Patient at Risk for Falls Due to: Orthopedic patient Fall risk Follow up: Falls evaluation completed, Education provided, Falls prevention discussed  Psychosocial Psychosocial Symptoms Reported: No symptoms reported Behavioral Health Self-Management Outcome: 4 (good) Major Change/Loss/Stressor/Fears (CP): Denies Techniques to Cope with Loss/Stress/Change: Not applicable Quality of Family  Relationships: helpful, involved, supportive Do you feel physically threatened by others?: No    04/26/2024    PHQ2-9 Depression Screening   Little interest or pleasure in doing things Not at all  Feeling down, depressed, or hopeless Not at all  PHQ-2 - Total Score 0  Trouble falling or staying asleep, or sleeping too much    Feeling tired or having little energy    Poor appetite or overeating     Feeling bad about yourself - or that you are a failure or have let yourself or your family down    Trouble concentrating on things, such as reading the newspaper or watching television    Moving or speaking so slowly that other people could have noticed.  Or the opposite - being so fidgety or restless that you have been moving around a lot more than usual    Thoughts that you would be better off dead, or hurting yourself in some way    PHQ2-9 Total Score    If you checked off any problems, how difficult have these problems made it for you to do your work, take care of things at home, or get along with other people    Depression Interventions/Treatment      Today's Vitals   04/26/24 1420  Weight: 295 lb (133.8 kg)   Pain Scale: 0-10 Pain Score: 5  Pain Type: Chronic pain Pain Location: Leg Pain Descriptors / Indicators: Tingling, Numbness Pain Onset: On-going Patients Stated Pain Goal: 0 Pain Intervention(s): Medication (See eMAR), Ambulation/increased activity Multiple Pain Sites: Yes  Medications Reviewed Today     Reviewed by Kay Hendricks MATSU, RN (Case Manager) on 04/26/24 at 1412  Med List Status: <None>   Medication Order Taking? Sig Documenting Provider Last Dose Status Informant  acetaminophen  (TYLENOL ) 325 MG tablet [id number] Yes Take 2 tablets (650 mg total) by mouth every 6 (six) hours as needed for mild pain (pain score 1-3) or moderate pain (pain score 4-6). Jens Durand, MD  Active Self, Pharmacy Records  albuterol  (VENTOLIN  HFA) 108 (90 Base) MCG/ACT inhaler [id number]  Yes Inhale 1-2 puffs into the lungs every 4 (four) hours as needed for wheezing or shortness of breath. Lenon Marien CROME, MD  Active   atorvastatin  (LIPITOR) 40 MG tablet [id number] Yes Take 40 mg by mouth daily. [provider]  Active   busPIRone  (BUSPAR ) 7.5 MG tablet [id number] Yes Take 7.5 mg by mouth 2 (two) times daily. [provider]  Active Self, Pharmacy Records  dapagliflozin  propanediol (FARXIGA ) 10 MG TABS tablet [id number] Yes Take 1 tablet (10 mg total) by mouth daily before breakfast. Fernand Denyse LABOR, MD  Active Self, Pharmacy Records  doxycycline  (VIBRA -TABS) 100 MG tablet [id number] Yes Take 1 tablet (100 mg total) by mouth 2 (two) times daily. [name], FNP  Active   ELIQUIS  5 MG TABS tablet [id number] Yes TAKE 1 TABLET BY MOUTH TWICE A DAY [name], FNP  Active Self, Pharmacy Records  enalapril  (VASOTEC ) 2.5 MG tablet [id number] Yes Take 1 tablet (2.5 mg total) by mouth daily. Fernand Denyse LABOR, MD  Active Self, Pharmacy Records  esomeprazole (NEXIUM) 40 MG capsule [id number] Yes TAKE 1 CAPSULE BY MOUTH EVERY DAY IN THE MORNING [name], FNP  Active Self, Pharmacy Records  fluticasone  (FLONASE ) 50 MCG/ACT nasal spray [id number] Yes Place 1 spray into both nostrils daily. [name], FNP  Active Self, Pharmacy Records  furosemide  (LASIX ) 20 MG tablet [id number] Yes Take 1 tablet (20 mg total) by mouth daily. Fernand Denyse LABOR, MD  Active Self, Pharmacy Records  GEMTESA  75 [place] TABS [id number] Yes TAKE 1 TABLET BY MOUTH EVERY DAY [name], FNP  Active   ipratropium-albuterol  (DUONEB) 0.5-2.5 (3) MG/3ML SOLN [id number] Yes Take 3 mLs by nebulization  every 6 (six) hours as needed. [name], FNP  Active Self, Pharmacy Records  meclizine  (ANTIVERT ) 12.5 MG tablet [id number] Yes Take 1 tablet (12.5 mg total) by mouth 2 (two) times daily as needed for dizziness. Cox, Amy N, DO  Active Self, Pharmacy Records  montelukast  (SINGULAIR ) 10  MG tablet [id number] Yes Take 1 tablet (10 mg total) by mouth at bedtime. [name], FNP  Active   nystatin  powder [id number] Yes Apply 1 Application topically 3 (three) times daily as needed. Irritation under breast and groin area [name], FNP  Active Self, Pharmacy Records  predniSONE  (DELTASONE ) 20 MG tablet [id number]  Take 2 tablets (40 mg total) by mouth daily with breakfast.  Patient not taking: Reported on 04/26/2024   [name], FNP  Consider Medication Status and Discontinue   TALTZ 80 MG/ML SOAJ [id number] Yes Inject 80 mg into the skin every 28 (twenty-eight) days. [provider]  Active Self, Pharmacy Records            Recommendation:   Continue Current Plan of Care  Follow Up Plan:   Telephone follow-up 2 weeks   Hendricks Her RN, BSN  [place] [facility] VBCI-Population Health RN Case Manager   Direct [phone]

## 2024-04-26 NOTE — Patient Instructions (Signed)
 Visit Information  Thank you for taking time to visit with me today. Please don't hesitate to contact me if I can be of assistance to you before our next scheduled appointment.  Your next care management appointment is by telephone on 05/10/2024 at 1:45  Telephone follow-up in 1 month  Please call the care guide team at [phone] if you need to cancel, schedule, or reschedule an appointment.   Please call the Suicide and Crisis Lifeline: 988 call the [place]  National Suicide Prevention Lifeline: 514-028-8755 or TTY: 670-593-5914 TTY 928-166-8507) to talk to a trained counselor call 1-800-273-TALK (toll free, 24 hour hotline) call 911 if you are experiencing a Mental Health or Behavioral Health Crisis or need someone to talk to.  Hendricks Her RN, BSN  [place] [facility] VBCI-Population Health RN Case Information Systems Manager [phone]

## 2024-04-26 NOTE — Progress Notes (Signed)
 [facility] has made a second and final attempt to reach this patient to refill the following medication:TALTZ  AUTOINJECTOR 80 mg/mL Atin auto-injector (ixekizumab ).      We have left voicemails on the following phone numbers: [phone] and have sent a text message to the following phone numbers: [phone].    Dates contacted: 04/19/24-04/26/24  Last scheduled delivery: 03/18/24    The patient Shanley be at risk of non-compliance with this medication. The patient should call the [facility] at [phone]  Option 4, then Option 2: Dermatology, Gastroenterology, Rheumatology to refill medication.    Dena LOISE [facility] Home Delivery Oncologist

## 2024-04-30 ENCOUNTER — Other Ambulatory Visit: Payer: Self-pay | Admitting: Internal Medicine

## 2024-04-30 DIAGNOSIS — M5440 Lumbago with sciatica, unspecified side: Secondary | ICD-10-CM

## 2024-05-02 ENCOUNTER — Other Ambulatory Visit: Payer: Self-pay | Admitting: Cardiovascular Disease

## 2024-05-02 DIAGNOSIS — E782 Mixed hyperlipidemia: Secondary | ICD-10-CM

## 2024-05-02 DIAGNOSIS — I4891 Unspecified atrial fibrillation: Secondary | ICD-10-CM

## 2024-05-02 DIAGNOSIS — R0602 Shortness of breath: Secondary | ICD-10-CM

## 2024-05-02 DIAGNOSIS — I5033 Acute on chronic diastolic (congestive) heart failure: Secondary | ICD-10-CM

## 2024-05-02 DIAGNOSIS — R0789 Other chest pain: Secondary | ICD-10-CM

## 2024-05-07 NOTE — Progress Notes (Signed)
 [facility] Delivery Pharmacy Refill Coordination Note    Specialty Medication(s) to be Shipped:   Inflammatory Disorders: Taltz     Other medication(s) to be shipped: No additional medications requested for fill at this time    Specialty Medications not needed at this time: N/A     Lisa Carlson, DOB: [birth year]  Phone: [phone] (home)       All above HIPAA information was verified with patient.     Was a nurse, learning disability used for this call? No    Completed refill call assessment today to schedule patient's medication shipment from the [facility] Delivery [facility]  [phone]).  All relevant notes have been reviewed.     Specialty medication(s) and dose(s) confirmed: Regimen is correct and unchanged.   Changes to medications: Derhonda reports no changes at this time.  Changes to insurance: No  New side effects reported not previously addressed with a pharmacist or physician: None reported  Questions for the pharmacist: No    Confirmed patient received a Conservation Officer, Historic Buildings and a Surveyor, Mining with first shipment. The patient will receive a drug information handout for each medication shipped and additional FDA Medication Guides as required.       DISEASE/MEDICATION-SPECIFIC INFORMATION        N/A    SPECIALTY MEDICATION ADHERENCE     Medication Adherence    Patient reported X missed doses in the last month: 0  Specialty Medication: TALTZ  AUTOINJECTOR 80 mg/mL Atin auto-injector (ixekizumab )  Patient is on additional specialty medications: No              Were doses missed due to medication being on hold? No    ixekizumab : TALTZ  AUTOINJECTOR 80 mg/mL Atin auto-injector  0 doses of medicine on hand       Specialty medication is an injection or given on a cycle: Yes, Next injection is scheduled for 05/14/2024.    REFERRAL TO PHARMACIST     Referral to the pharmacist: Not needed      [facility]     Shipping address confirmed in Epic.     Cost and Payment: Patient has a copay of $4.90. They are aware and have authorized the pharmacy to charge the credit card on file.    Delivery Scheduled: Yes, Expected medication delivery date: 05/10/2024.     Medication will be delivered via Next Day Courier to the prescription address in Epic WAM.    Nelida Winfred [facility]  Specialty Technician

## 2024-05-09 ENCOUNTER — Other Ambulatory Visit: Payer: Self-pay | Admitting: Specialist

## 2024-05-09 DIAGNOSIS — R911 Solitary pulmonary nodule: Secondary | ICD-10-CM

## 2024-05-09 MED FILL — TALTZ AUTOINJECTOR 80 MG/ML SUBCUTANEOUS: SUBCUTANEOUS | 28 days supply | Qty: 1 | Fill #6

## 2024-05-10 ENCOUNTER — Telehealth: Payer: Self-pay

## 2024-05-10 NOTE — Patient Instructions (Signed)
 Patricia Walker - I am sorry I was unable to reach you today for our scheduled appointment. I work with [name], FNP and am calling to support your healthcare needs. Please contact me at [phone] at your earliest convenience. I look forward to speaking with you soon.   Thank you,  Hendricks Her RN, BSN  [place] VBCI-Population Health RN Case Manager   Direct [phone]

## 2024-05-13 ENCOUNTER — Telehealth: Payer: Self-pay

## 2024-05-13 NOTE — Patient Instructions (Signed)
 Patricia Walker - I am sorry I was unable to reach you today for our scheduled appointment. I work with [name], FNP and am calling to support your healthcare needs. Please contact me at [phone] at your earliest convenience. I look forward to speaking with you soon.   Thank you,  Hendricks Her RN, BSN  [place] VBCI-Population Health RN Case Manager   Direct [phone]

## 2024-05-15 ENCOUNTER — Other Ambulatory Visit: Payer: Self-pay

## 2024-05-15 NOTE — Patient Instructions (Signed)
 Visit Information  Thank you for taking time to visit with me today. Please don't hesitate to contact me if I can be of assistance to you before our next scheduled appointment.  Your next care management appointment is by telephone on 06/12/2024 at 10:00 AM   Telephone follow-up in 1 month  Please call the care guide team at [phone] if you need to cancel, schedule, or reschedule an appointment.   Please call the Suicide and Crisis Lifeline: 988 call the [place]  National Suicide Prevention Lifeline: 2792246712 or TTY: 847-621-9904 TTY 773-002-8980) to talk to a trained counselor call 1-800-273-TALK (toll free, 24 hour hotline) call 911 if you are experiencing a Mental Health or Behavioral Health Crisis or need someone to talk to.  Hendricks Her RN, BSN  [place] [facility] VBCI-Population Health RN Case Information Systems Manager [phone]

## 2024-05-15 NOTE — Patient Outreach (Signed)
 Complex Care Management   Visit Note  05/15/2024  Name:  Patricia Walker MRN: [id number] DOB: 1945-12-10  Situation: Referral received for Complex Care Management related to obesity and pain  I obtained verbal consent from Patient.  Visit completed with Patient  on the phone  Background:   Past Medical History:  Diagnosis Date   Anxiety    Arthritis    Asthma    CHF (congestive heart failure) (HCC)    COPD exacerbation (HCC) 10/08/2020   Coronary artery disease    mild, nonobstructive   Depression    Dyspnea    on exertion   Dysrhythmia    Atrial Fibrillation   GERD (gastroesophageal reflux disease)    Heart murmur    Hyperlipidemia    Hypertension    Hypothyroidism    MI, old 2016   nonobstructive CAD by Cath   Morbid obesity (HCC)    Persistent atrial fibrillation (HCC)    Pneumonia 06/2018   and RSV   Psoriasis    Sleep apnea    compliant with CPAP   Thyroid  disease     Assessment: Patient Reported Symptoms:  Cognitive Cognitive Status: Alert and oriented to person, place, and time   Health Maintenance Behaviors: Annual physical exam, Hobbies (like to do puzzles and write  Gread Grandkids) Healing Pattern: Average Health Facilitated by: Rest  Neurological Neurological Review of Symptoms: Numbness Oher Neurological Symptoms/Conditions [RPT]: neuropathy Neurological Management Strategies: Coping strategies, Medication therapy, Routine screening Neurological Self-Management Outcome: 4 (good)  HEENT HEENT Symptoms Reported: Change or loss of hearing, Tearing (Right ear feels like it is topped up/ swishing noise Ear Dr didn't find anything) HEENT Management Strategies: Medication therapy, Routine screening, Coping strategies HEENT Self-Management Outcome: 4 (good) HEENT Comment: making eye appt after dentures appointment    Cardiovascular Cardiovascular Symptoms Reported: Fatigue (Reports no swelling this time) Does patient have uncontrolled Hypertension?:  No Cardiovascular Management Strategies: Medication therapy, Routine screening Weight:  (Patient not weighing) Cardiovascular Self-Management Outcome: 3 (uncertain)  Respiratory Respiratory Symptoms Reported: Productive cough Other Respiratory Symptoms: sputum has no color Additional Respiratory Details: inhalers discussed  Maintenance and rescue Respiratory Management Strategies: Routine screening, Adequate rest, Coping strategies Respiratory Self-Management Outcome: 4 (good)  Endocrine Endocrine Symptoms Reported: Not assessed Endocrine Comment: Patient wanted to end call  Gastrointestinal Gastrointestinal Symptoms Reported: Not assessed Gastrointestinal Comment: Pateint ended call    Genitourinary Genitourinary Symptoms Reported: Not assessed Genitourinary Comment: Patient ended call  Integumentary Integumentary Symptoms Reported: Not assessed Skin Comment: Patient ended call  Musculoskeletal Musculoskelatal Symptoms Reviewed: Not assessed Musculoskeletal Comment: Patient ended call Falls in the past year?:  (Not assessed  Patient ended call) Patient at Risk for Falls Due to: Orthopedic patient  Psychosocial Psychosocial Symptoms Reported: Not assessed Additional Psychological Details: Patient ended call     Quality of Family Relationships: helpful, involved, supportive Do you feel physically threatened by others?: No    05/15/2024    PHQ2-9 Depression Screening   Little interest or pleasure in doing things    Feeling down, depressed, or hopeless    PHQ-2 - Total Score    Trouble falling or staying asleep, or sleeping too much    Feeling tired or having little energy    Poor appetite or overeating     Feeling bad about yourself - or that you are a failure or have let yourself or your family down    Trouble concentrating on things, such as reading the newspaper or watching television  Moving or speaking so slowly that other people could have noticed.  Or the opposite - being  so fidgety or restless that you have been moving around a lot more than usual    Thoughts that you would be better off dead, or hurting yourself in some way    PHQ2-9 Total Score    If you checked off any problems, how difficult have these problems made it for you to do your work, take care of things at home, or get along with other people    Depression Interventions/Treatment      Today's Vitals   Pain Scale: 0-10 Pain Type: Chronic pain Pain Location: Leg Pain Orientation: Mid Pain Descriptors / Indicators: Numbness, Tingling Pain Onset: On-going Patients Stated Pain Goal: 0 Pain Intervention(s): Medication (See eMAR), Ambulation/increased activity Multiple Pain Sites: Yes  Medications Reviewed Today     Reviewed by Kay Hendricks MATSU, RN (Case Manager) on 05/15/24 at 1011  Med List Status: <None>   Medication Order Taking? Sig Documenting Provider Last Dose Status Informant  acetaminophen  (TYLENOL ) 325 MG tablet [id number] Yes Take 2 tablets (650 mg total) by mouth every 6 (six) hours as needed for mild pain (pain score 1-3) or moderate pain (pain score 4-6). Jens Durand, MD  Active Self, Pharmacy Records  albuterol  (VENTOLIN  HFA) 108 (90 Base) MCG/ACT inhaler [id number] Yes Inhale 1-2 puffs into the lungs every 4 (four) hours as needed for wheezing or shortness of breath. Lenon Marien CROME, MD  Active   atorvastatin  (LIPITOR) 40 MG tablet [id number] Yes Take 40 mg by mouth daily. [provider]  Active   busPIRone  (BUSPAR ) 7.5 MG tablet [id number] Yes Take 7.5 mg by mouth 2 (two) times daily. [provider]  Active Self, Pharmacy Records  doxycycline  (VIBRA -TABS) 100 MG tablet [id number] Yes Take 1 tablet (100 mg total) by mouth 2 (two) times daily. [name], FNP  Active   ELIQUIS  5 MG TABS tablet [id number] Yes TAKE 1 TABLET BY MOUTH TWICE A DAY [name], FNP  Active Self, Pharmacy Records  enalapril  (VASOTEC ) 2.5 MG tablet [id number] Yes Take 1  tablet (2.5 mg total) by mouth daily. Fernand Denyse LABOR, MD  Active Self, Pharmacy Records  esomeprazole (NEXIUM) 40 MG capsule [id number] Yes TAKE 1 CAPSULE BY MOUTH EVERY DAY IN THE MORNING [name], FNP  Active Self, Pharmacy Records  FARXIGA  10 MG TABS tablet [id number] Yes TAKE 1 TABLET BY MOUTH DAILY BEFORE BREAKFAST. Scoggins, Amber, NP  Active   fluticasone  (FLONASE ) 50 MCG/ACT nasal spray [id number] Yes Place 1 spray into both nostrils daily. [name], FNP  Active Self, Pharmacy Records  furosemide  (LASIX ) 20 MG tablet [id number] Yes Take 1 tablet (20 mg total) by mouth daily. Fernand Denyse LABOR, MD  Active Self, Pharmacy Records  GEMTESA  75 MG TABS [id number] Yes TAKE 1 TABLET BY MOUTH EVERY DAY [name], FNP  Active   ipratropium-albuterol  (DUONEB) 0.5-2.5 (3) MG/3ML SOLN [id number] Yes Take 3 mLs by nebulization every 6 (six) hours as needed. [name], FNP  Active Self, Pharmacy Records  meclizine  (ANTIVERT ) 12.5 MG tablet [id number] Yes Take 1 tablet (12.5 mg total) by mouth 2 (two) times daily as needed for dizziness. Cox, Amy LOISE, DO  Active Self, Pharmacy Records  meloxicam (MOBIC) 15 MG tablet [id number] Yes TAKE 1 TABLET BY MOUTH EVERY DAY Fernand Fredy RAMAN, MD  Active   montelukast  (SINGULAIR ) 10 MG tablet [id number] Yes Take 1 tablet (10 mg total)  by mouth at bedtime. [name], FNP  Active   nystatin  powder [id number] Yes Apply 1 Application topically 3 (three) times daily as needed. Irritation under breast and groin area [name], FNP  Active Self, Pharmacy Records  predniSONE  (DELTASONE ) 20 MG tablet [id number]  Take 2 tablets (40 mg total) by mouth daily with breakfast.  Patient not taking: Reported on 05/15/2024   [name], FNP  Consider Medication Status and Discontinue   TALTZ 80 MG/ML SOAJ [id number] Yes Inject 80 mg into the skin every 28 (twenty-eight) days. [provider]  Active Self, Pharmacy Records             Recommendation:   Continue Current Plan of Care Make eye appointment   Follow Up Plan:   Telephone follow-up in 1 month  Hendricks Her RN, BSN  [place] [facility] RN Case Information Systems Manager [phone]

## 2024-05-16 ENCOUNTER — Other Ambulatory Visit: Payer: Self-pay

## 2024-05-16 ENCOUNTER — Other Ambulatory Visit

## 2024-05-16 ENCOUNTER — Ambulatory Visit

## 2024-05-16 VITALS — BP 130/84 | HR 58 | Ht 65.0 in | Wt 303.0 lb

## 2024-05-16 DIAGNOSIS — Z6841 Body Mass Index (BMI) 40.0 and over, adult: Secondary | ICD-10-CM

## 2024-05-16 DIAGNOSIS — L03113 Cellulitis of right upper limb: Secondary | ICD-10-CM | POA: Insufficient documentation

## 2024-05-16 DIAGNOSIS — R300 Dysuria: Secondary | ICD-10-CM

## 2024-05-16 DIAGNOSIS — I10 Essential (primary) hypertension: Secondary | ICD-10-CM

## 2024-05-16 DIAGNOSIS — L409 Psoriasis, unspecified: Secondary | ICD-10-CM

## 2024-05-16 DIAGNOSIS — R911 Solitary pulmonary nodule: Secondary | ICD-10-CM | POA: Insufficient documentation

## 2024-05-16 DIAGNOSIS — M5136 Other intervertebral disc degeneration, lumbar region with discogenic back pain only: Secondary | ICD-10-CM

## 2024-05-16 LAB — POCT URINALYSIS DIPSTICK
Bilirubin, UA: NEGATIVE
Blood, UA: NEGATIVE
Glucose, UA: NEGATIVE
Ketones, UA: NEGATIVE
Leukocytes, UA: NEGATIVE
Nitrite, UA: NEGATIVE
Protein, UA: POSITIVE — AB
Spec Grav, UA: >=1.03 — AB
Urobilinogen, UA: 0.2 U/dL
pH, UA: 5.5

## 2024-05-16 MED ORDER — HYDROCORTISONE 1 % EX CREA: TOPICAL_CREAM | CUTANEOUS | 1 refills | Status: AC

## 2024-05-16 MED ORDER — PREDNISONE 20 MG PO TABS: 40.0000 mg | ORAL_TABLET | Freq: Every day | ORAL | 0 refills | Status: AC

## 2024-05-16 MED ORDER — AMOXICILLIN-POT CLAVULANATE 875-125 MG PO TABS: 1.0000 | ORAL_TABLET | Freq: Two times a day (BID) | ORAL | 0 refills | Status: AC

## 2024-05-16 MED ORDER — WEGOVY 1.5 MG PO TABS: 1.5000 mg | ORAL_TABLET | Freq: Every day | ORAL | 0 refills | Status: AC

## 2024-05-16 NOTE — Assessment & Plan Note (Signed)
-   Topical hydrocortisone  cream ordered. - Augmentin  ordered. - Recommend keeping rash clean and dry. Prevent scratching.

## 2024-05-16 NOTE — Assessment & Plan Note (Signed)
-   Reinforced healthy diet and exercise as tolerated. - Continue medications as prescribed. - Start Wegovy  oral tablets as prescribed. - Check UA. - Prednisone  burst as prescribed. Recommend conservative therapies such as rest, gentle stretching, heat application. - Discussed updated xray of lumbar spine if symptoms persist.

## 2024-05-16 NOTE — Assessment & Plan Note (Signed)
-   6 month repeat low dose chest CT ordered.

## 2024-05-16 NOTE — Progress Notes (Signed)
 "  Established Patient Office Visit  Subjective:  Patient ID: Patricia Walker, female    DOB: [birth year]  Age: 79 y.o. MRN: [id number]  Chief Complaint  Patient presents with   Rash    Rash on arm and back    Patient is here today for her acute visit.  She has been feeling fairly well since last appointment.   She does have additional concerns to discuss today:  Patient has new rash on right forearm. States it is itchy and dry initially. Rash is now red and warm to the touch. Patient has hx of Psoriasis. Reports she took her injection last week but took it in her thigh instead of her stomach. Will send in topical steroid cream to use for itching and start Augmentin  for cellulitis. Recommend patient keep rash clean and dry and avoid scratching if at all possible.  Patient also endorses lumbar back pain that is sharp stabbing with certain movements such as bending or wiping after using the restroom. Hx DDD. Has not had imaging since 2021. Will repeat xray if back pain persists after steroids and conservative therapy. Recommend rest, gentle stretching, heat application. Will send in prednisone  burst to help with inflammation. Check UA to rule out UTI causing lumbar/flank pain.  Patient states he weight is becoming a problem and her weight prevents her from moving her body efficiently and makes her aches and pains worse. Discussed weight loss medication options. Patient would like to try Wegovy  oral tablets over injectables at this time. Order sent for Wegovy  1.5 mg daily tablet. Reinforced healthy diet and exercise as tolerated.  Labs are not due today. Patient is due for 6 month FU low dose chest CT for lung nodule finding from 11/2023. Will order. She does not need refills.   I have reviewed her active problem list, medication list, allergies, family history, social history, health maintenance, notes from last encounter, lab results, imaging for her appointment today.      No other concerns  at this time.   Past Medical History:  Diagnosis Date   Anxiety    Arthritis    Asthma    CHF (congestive heart failure) (HCC)    COPD exacerbation (HCC) 10/08/2020   Coronary artery disease    mild, nonobstructive   Depression    Dyspnea    on exertion   Dysrhythmia    Atrial Fibrillation   GERD (gastroesophageal reflux disease)    Heart murmur    Hyperlipidemia    Hypertension    Hypothyroidism    MI, old 2016   nonobstructive CAD by Cath   Morbid obesity (HCC)    Persistent atrial fibrillation (HCC)    Pneumonia 06/2018   and RSV   Psoriasis    Sleep apnea    compliant with CPAP   Thyroid  disease     Past Surgical History:  Procedure Laterality Date   ARTERY BIOPSY Right 07/21/2017   Procedure: BIOPSY TEMPORAL ARTERY;  Surgeon: Jama Cordella MATSU, MD;  Location: [facility];  Service: Vascular;  Laterality: Right;   CARDIAC CATHETERIZATION     CARDIOVERSION Right 09/01/2016   Procedure: Cardioversion;  Surgeon: Fernand Denyse LABOR, MD;  Location: [facility];  Service: Cardiovascular;  Laterality: Right;   CARDIOVERSION N/A 09/09/2016   Procedure: Cardioversion;  Surgeon: Fernand Denyse LABOR, MD;  Location: [facility];  Service: Cardiovascular;  Laterality: N/A;   CATARACT EXTRACTION W/PHACO Right 03/26/2019   Procedure: CATARACT EXTRACTION PHACO AND INTRAOCULAR LENS PLACEMENT (IOC) RIGHT 6.45,  00:39.9;  Surgeon: Jaye Fallow, MD;  Location: [facility];  Service: Ophthalmology;  Laterality: Right;   CATARACT EXTRACTION W/PHACO Left 04/16/2019   Procedure: CATARACT EXTRACTION PHACO AND INTRAOCULAR LENS PLACEMENT (IOC) LEFT;   3.14, 00:24.9;  Surgeon: Jaye Fallow, MD;  Location: [facility];  Service: Ophthalmology;  Laterality: Left;  sleep apnea-CPAP   COLONOSCOPY WITH PROPOFOL  N/A 08/28/2019   Procedure: COLONOSCOPY WITH PROPOFOL ;  Surgeon: Toledo, Ladell POUR, MD;  Location: [facility];  Service: Gastroenterology;  Laterality: N/A;   ELECTROPHYSIOLOGIC  STUDY N/A 02/01/2016   Procedure: CARDIOVERSION;  Surgeon: Denyse DELENA Bathe, MD;  Location: [facility];  Service: Cardiovascular;  Laterality: N/A;   ESOPHAGEAL DILATION     ESOPHAGOGASTRODUODENOSCOPY (EGD) WITH PROPOFOL  N/A 02/06/2017   Procedure: ESOPHAGOGASTRODUODENOSCOPY (EGD) WITH PROPOFOL ;  Surgeon: Therisa Bi, MD;  Location: [facility];  Service: Gastroenterology;  Laterality: N/A;   ESOPHAGOGASTRODUODENOSCOPY (EGD) WITH PROPOFOL  N/A 08/28/2019   Procedure: ESOPHAGOGASTRODUODENOSCOPY (EGD) WITH PROPOFOL ;  Surgeon: Toledo, Ladell POUR, MD;  Location: [facility];  Service: Gastroenterology;  Laterality: N/A;   EUS N/A 02/16/2017   Procedure: FULL UPPER ENDOSCOPIC ULTRASOUND (EUS) RADIAL;  Surgeon: Elta Fonda SQUIBB, MD;  Location: [facility];  Service: Gastroenterology;  Laterality: N/A;   LEFT HEART CATH AND CORONARY ANGIOGRAPHY N/A 09/08/2016   Procedure: Left Heart Cath and Coronary Angiography;  Surgeon: Bathe Denyse DELENA, MD;  Location: [facility];  Service: Cardiovascular;  Laterality: N/A;   US  ECHOCARDIOGRAPHY      Social History   Socioeconomic History   Marital status: Divorced    Spouse name: Not on file   Number of children: Not on file   Years of education: Not on file   Highest education level: Not on file  Occupational History   Not on file  Tobacco Use   Smoking status: Former    Current packs/day: 0.00    Types: Cigarettes    Quit date: 02/01/2013    Years since quitting: 11.2   Smokeless tobacco: Never  Vaping Use   Vaping status: Never Used  Substance and Sexual Activity   Alcohol use: No   Drug use: No   Sexual activity: Not on file  Other Topics Concern   Not on file  Social History Narrative   Lives in [place] with multiple family members   unemployed   Social Drivers of Health   Tobacco Use: Medium Risk (05/16/2024)   Patient History    Smoking Tobacco Use: Former    Smokeless Tobacco Use: Never    Passive Exposure: Not on file   Financial Resource Strain: Low Risk  (10/23/2023)   Received from [facility]   Overall Financial Resource Strain (CARDIA)    Difficulty of Paying Living Expenses: Not very hard  Food Insecurity: No Food Insecurity (05/15/2024)   Epic    Worried About Running Out of Food in the Last Year: Never true    Ran Out of Food in the Last Year: Never true  Transportation Needs: No Transportation Needs (05/15/2024)   Epic    Lack of Transportation (Medical): No    Lack of Transportation (Non-Medical): No  Physical Activity: Not on file  Stress: Not on file  Social Connections: Moderately Integrated (02/13/2024)   Social Connection and Isolation Panel    Frequency of Communication with Friends and Family: More than three times a week    Frequency of Social Gatherings with Friends and Family: More than three times a week  Attends Religious Services: 1 to 4 times per year    Active Member of Clubs or Organizations: Yes    Attends Banker Meetings: 1 to 4 times per year    Marital Status: Divorced  Intimate Partner Violence: Not At Risk (05/15/2024)   Epic    Fear of Current or Ex-Partner: No    Emotionally Abused: No    Physically Abused: No    Sexually Abused: No  Depression (PHQ2-9): Low Risk (04/26/2024)   Depression (PHQ2-9)    PHQ-2 Score: 0  Alcohol Screen: Not on file  Housing: Low Risk (05/15/2024)   Epic    Unable to Pay for Housing in the Last Year: No    Number of Times Moved in the Last Year: 0    Homeless in the Last Year: No  Utilities: Not At Risk (05/15/2024)   Epic    Threatened with loss of utilities: No  Health Literacy: Not on file    Family History  Problem Relation Age of Onset   Breast cancer Paternal Aunt    Other Father    Heart attack Father     Allergies[1]  Review of Systems  Constitutional:  Positive for malaise/fatigue.  HENT: Negative.    Eyes:  Negative for blurred vision and pain.  Respiratory:  Negative for cough and  shortness of breath.   Cardiovascular:  Negative for chest pain, palpitations, claudication and leg swelling.  Gastrointestinal:  Negative for abdominal pain, blood in stool, constipation, diarrhea, nausea and vomiting.  Genitourinary:  Positive for flank pain (right sided). Negative for dysuria, frequency and urgency.  Musculoskeletal:  Positive for back pain (right lumbar region).  Skin:  Positive for itching and rash.       Right forearm. Red, warm  Neurological:  Negative for dizziness, tingling, sensory change and headaches.  Endo/Heme/Allergies: Negative.   Psychiatric/Behavioral: Negative.         Objective:   BP 130/84   Pulse (!) 58   Ht 5' 5 (1.651 m)   Wt (!) 303 lb (137.4 kg)   SpO2 98%   BMI 50.42 kg/m   Vitals:   05/16/24 1143  BP: 130/84  Pulse: (!) 58  Height: 5' 5 (1.651 m)  Weight: (!) 303 lb (137.4 kg)  SpO2: 98%  BMI (Calculated): 50.42    Physical Exam Vitals and nursing note reviewed.  Constitutional:      Appearance: Normal appearance.  HENT:     Head: Normocephalic.  Eyes:     Extraocular Movements: Extraocular movements intact.     Pupils: Pupils are equal, round, and reactive to light.  Cardiovascular:     Rate and Rhythm: Normal rate and regular rhythm.     Pulses: Normal pulses.     Heart sounds: Normal heart sounds. No murmur heard. Pulmonary:     Effort: Pulmonary effort is normal. No respiratory distress.     Breath sounds: Normal breath sounds.  Abdominal:     General: There is no distension.     Tenderness: There is no abdominal tenderness.  Musculoskeletal:        General: No tenderness. Normal range of motion.     Cervical back: Normal range of motion and neck supple.     Right lower leg: No edema.     Left lower leg: No edema.  Skin:    General: Skin is warm and dry.     Coloration: Skin is not jaundiced.     Findings: No erythema.  Neurological:     General: No focal deficit present.     Mental Status: She is alert  and oriented to person, place, and time.  Psychiatric:        Mood and Affect: Mood normal.        Speech: Speech normal.        Behavior: Behavior is cooperative.        Cognition and Memory: Memory is not impaired.      Results for orders placed or performed in visit on 05/16/24  POCT Urinalysis Dipstick (81002)  Result Value Ref Range   Color, UA Yellow    Clarity, UA Turbid    Glucose, UA Negative Negative   Bilirubin, UA Negative    Ketones, UA Negative    Spec Grav, UA >=1.030 (A) 1.010 - 1.025   Blood, UA Negative    pH, UA 5.5 5.0 - 8.0   Protein, UA Positive (A) Negative   Urobilinogen, UA 0.2 0.2 or 1.0 E.U./dL   Nitrite, UA Negative    Leukocytes, UA Negative Negative   Appearance Turbid    Odor Yes     Recent Results (from the past 2160 hours)  POCT Urinalysis Dipstick (18997)     Status: Abnormal   Collection Time: 03/01/24  3:37 PM  Result Value Ref Range   Color, UA Yellow    Clarity, UA Clear    Glucose, UA Positive (A) Negative   Bilirubin, UA Negative    Ketones, UA Negative    Spec Grav, UA 1.015 1.010 - 1.025   Blood, UA Negative    pH, UA 5.5 5.0 - 8.0   Protein, UA Negative Negative   Urobilinogen, UA 0.2 0.2 or 1.0 E.U./dL   Nitrite, UA Negative    Leukocytes, UA Negative Negative   Appearance Clear    Odor No   Culture, Urine     Status: None   Collection Time: 03/01/24  4:04 PM   Specimen: Urine   UR  Result Value Ref Range   Urine Culture, Routine Final report    Organism ID, Bacteria Comment     Comment: Mixed urogenital flora 10,000-25,000 colony forming units per mL   Iron, TIBC and Ferritin Panel     Status: Abnormal   Collection Time: 03/06/24 12:04 PM  Result Value Ref Range   Total Iron Binding Capacity 322 250 - 450 ug/dL   UIBC 780 881 - 630 ug/dL   Iron 896 27 - 860 ug/dL   Iron Saturation 32 15 - 55 %   Ferritin 184 (H) 15 - 150 ng/mL  Lipid panel     Status: Abnormal   Collection Time: 03/06/24 12:06 PM  Result  Value Ref Range   Cholesterol, Total 131 100 - 199 mg/dL   Triglycerides 824 (H) 0 - 149 mg/dL   HDL 46 >60 mg/dL   VLDL Cholesterol Cal 29 5 - 40 mg/dL   LDL Chol Calc (NIH) 56 0 - 99 mg/dL   Chol/HDL Ratio 2.8 0.0 - 4.4 ratio    Comment:                                   T. Chol/HDL Ratio  Men  Women                               1/2 Avg.Risk  3.4    3.3                                   Avg.Risk  5.0    4.4                                2X Avg.Risk  9.6    7.1                                3X Avg.Risk 23.4   11.0   VITAMIN D  25 Hydroxy (Vit-D Deficiency, Fractures)     Status: None   Collection Time: 03/06/24 12:06 PM  Result Value Ref Range   Vit D, 25-Hydroxy 57.5 30.0 - 100.0 ng/mL    Comment: Vitamin D  deficiency has been defined by the Institute of Medicine and an Endocrine Society practice guideline as a level of serum 25-OH vitamin D  less than 20 ng/mL (1,2). The Endocrine Society went on to further define vitamin D  insufficiency as a level between 21 and 29 ng/mL (2). 1. IOM (Institute of Medicine). 2010. Dietary reference    intakes for calcium  and D. [place]: The    [employer]. 2. Holick MF, Binkley Broadwater, Bischoff-Ferrari HA, et al.    Evaluation, treatment, and prevention of vitamin D     deficiency: an Endocrine Society clinical practice    guideline. JCEM. 2011 Jul; 96(7):1911-30.   CMP14+EGFR     Status: Abnormal   Collection Time: 03/06/24 12:06 PM  Result Value Ref Range   Glucose 104 (H) 70 - 99 mg/dL   BUN 27 8 - 27 mg/dL   Creatinine, Ser 8.47 (H) 0.57 - 1.00 mg/dL   eGFR 35 (L) >40 fO/fpw/8.26   BUN/Creatinine Ratio 18 12 - 28   Sodium 138 134 - 144 mmol/L   Potassium 4.2 3.5 - 5.2 mmol/L   Chloride 98 96 - 106 mmol/L   CO2 23 20 - 29 mmol/L   Calcium  9.5 8.7 - 10.3 mg/dL   Total Protein 6.5 6.0 - 8.5 g/dL   Albumin 4.2 3.8 - 4.8 g/dL   Globulin, Total 2.3 1.5 - 4.5 g/dL   Bilirubin  Total 1.2 0.0 - 1.2 mg/dL   Alkaline Phosphatase 96 49 - 135 IU/L   AST 19 0 - 40 IU/L   ALT 15 0 - 32 IU/L  TSH     Status: None   Collection Time: 03/06/24 12:06 PM  Result Value Ref Range   TSH 3.490 0.450 - 4.500 uIU/mL  Hemoglobin A1c     Status: Abnormal   Collection Time: 03/06/24 12:06 PM  Result Value Ref Range   Hgb A1c MFr Bld 6.2 (H) 4.8 - 5.6 %    Comment:          Prediabetes: 5.7 - 6.4          Diabetes: >6.4          Glycemic control for adults with diabetes: <7.0    Est. average glucose Bld gHb Est-mCnc 131 mg/dL  Vitamin B12     Status: None  Collection Time: 03/06/24 12:06 PM  Result Value Ref Range   Vitamin B-12 612 232 - 1,245 pg/mL  CBC with Diff     Status: Abnormal   Collection Time: 03/06/24 12:06 PM  Result Value Ref Range   WBC 9.8 3.4 - 10.8 x10E3/uL   RBC 5.57 (H) 3.77 - 5.28 x10E6/uL   Hemoglobin 16.0 (H) 11.1 - 15.9 g/dL   Hematocrit 50.8 (H) 65.9 - 46.6 %   MCV 88 79 - 97 fL   MCH 28.7 26.6 - 33.0 pg   MCHC 32.6 31.5 - 35.7 g/dL   RDW 85.6 88.2 - 84.5 %   Platelets 317 150 - 450 x10E3/uL   Neutrophils 69 Not Estab. %   Lymphs 20 Not Estab. %   Monocytes 8 Not Estab. %   Eos 1 Not Estab. %   Basos 1 Not Estab. %   Neutrophils Absolute 6.8 1.4 - 7.0 x10E3/uL   Lymphocytes Absolute 1.9 0.7 - 3.1 x10E3/uL   Monocytes Absolute 0.8 0.1 - 0.9 x10E3/uL   EOS (ABSOLUTE) 0.1 0.0 - 0.4 x10E3/uL   Basophils Absolute 0.1 0.0 - 0.2 x10E3/uL   Immature Granulocytes 1 Not Estab. %   Immature Grans (Abs) 0.1 0.0 - 0.1 x10E3/uL  Mononucleosis screen     Status: Abnormal   Collection Time: 03/06/24 12:06 PM  Result Value Ref Range   Mono Screen Positive (A) Negative    Comment: The sensitivity of Heterophile antibody testing is 80-90%. Elbert Shine IgM testing offers higher sensitivity.   Lyme Disease Serology w/Reflex     Status: None   Collection Time: 03/06/24 12:08 PM  Result Value Ref Range   Lyme Total Antibody EIA Negative Negative     Comment: Lyme antibodies not detected. Reflex testing is not indicated. No laboratory evidence of infection with B. burgdorferi (Lyme disease). Negative results [place] occur in patients recently infected (less than or equal to 14 days) with B. burgdorferi.  If recent infection is suspected, repeat testing on a new sample collected in 7 to 14 days is recommended.   CMP14+EGFR     Status: Abnormal   Collection Time: 03/20/24 10:58 AM  Result Value Ref Range   Glucose 98 70 - 99 mg/dL   BUN 14 8 - 27 mg/dL   Creatinine, Ser 8.70 (H) 0.57 - 1.00 mg/dL   eGFR 43 (L) >40 fO/fpw/8.26   BUN/Creatinine Ratio 11 (L) 12 - 28   Sodium 144 134 - 144 mmol/L   Potassium 3.9 3.5 - 5.2 mmol/L   Chloride 105 96 - 106 mmol/L   CO2 24 20 - 29 mmol/L   Calcium  8.7 8.7 - 10.3 mg/dL   Total Protein 5.8 (L) 6.0 - 8.5 g/dL   Albumin 3.7 (L) 3.8 - 4.8 g/dL   Globulin, Total 2.1 1.5 - 4.5 g/dL   Bilirubin Total 0.6 0.0 - 1.2 mg/dL   Alkaline Phosphatase 83 49 - 135 IU/L   AST 17 0 - 40 IU/L   ALT 15 0 - 32 IU/L  POCT Urinalysis Dipstick (18997)     Status: Abnormal   Collection Time: 05/16/24 12:36 PM  Result Value Ref Range   Color, UA Yellow    Clarity, UA Turbid    Glucose, UA Negative Negative   Bilirubin, UA Negative    Ketones, UA Negative    Spec Grav, UA >=1.030 (A) 1.010 - 1.025   Blood, UA Negative    pH, UA 5.5 5.0 - 8.0   Protein,  UA Positive (A) Negative   Urobilinogen, UA 0.2 0.2 or 1.0 E.U./dL   Nitrite, UA Negative    Leukocytes, UA Negative Negative   Appearance Turbid    Odor Yes        Assessment & Plan:   Assessment & Plan Dysuria Degeneration of intervertebral disc of lumbar region with discogenic back pain Morbid obesity with BMI of 50.0-59.9, adult (HCC) Primary hypertension - Reinforced healthy diet and exercise as tolerated. - Continue medications as prescribed. - Start Wegovy  oral tablets as prescribed. - Check UA. - Prednisone  burst as prescribed. Recommend  conservative therapies such as rest, gentle stretching, heat application. - Discussed updated xray of lumbar spine if symptoms persist. Nodule of lower lobe of right lung - 6 month repeat low dose chest CT ordered. Cellulitis of right upper extremity Psoriasis - Topical hydrocortisone  cream ordered. - Augmentin  ordered. - Recommend keeping rash clean and dry. Prevent scratching.    Return in about 10 days (around 05/26/2024).   Total time spent: 30 minutes  Oddis DELENA Cain, FNP  05/16/2024   This document [place] have been prepared by [facility] Voice Recognition software and as such Eke include unintentional dictation errors.     [1]  Allergies Allergen Reactions   Codeine Hives, Itching and Rash   Other Itching and Other (See Comments)    States antibiotic in the past caused itching but can not remember name   "

## 2024-05-17 LAB — CMP14+EGFR
ALT: 13 [IU]/L (ref 0–32)
AST: 20 [IU]/L (ref 0–40)
Albumin: 3.7 g/dL — ABNORMAL LOW (ref 3.8–4.8)
Alkaline Phosphatase: 82 [IU]/L (ref 49–135)
BUN/Creatinine Ratio: 15 (ref 12–28)
BUN: 17 mg/dL (ref 8–27)
Bilirubin Total: 0.6 mg/dL (ref 0.0–1.2)
CO2: 23 mmol/L (ref 20–29)
Calcium: 9.2 mg/dL (ref 8.7–10.3)
Chloride: 105 mmol/L (ref 96–106)
Creatinine, Ser: 1.12 mg/dL — ABNORMAL HIGH (ref 0.57–1.00)
Globulin, Total: 2.8 g/dL (ref 1.5–4.5)
Glucose: 113 mg/dL — ABNORMAL HIGH (ref 70–99)
Potassium: 4.7 mmol/L (ref 3.5–5.2)
Sodium: 141 mmol/L (ref 134–144)
Total Protein: 6.5 g/dL (ref 6.0–8.5)
eGFR: 51 mL/min/{1.73_m2} — ABNORMAL LOW

## 2024-05-27 ENCOUNTER — Ambulatory Visit

## 2024-05-30 ENCOUNTER — Ambulatory Visit

## 2024-06-12 ENCOUNTER — Telehealth
